# Patient Record
Sex: Male | Born: 1945 | Race: White | Hispanic: No | Marital: Married | Smoking: Former smoker
Health system: Southern US, Community
[De-identification: ages and names within clinical notes are randomized; demographics above are authoritative.]

## PROBLEM LIST (undated history)

## (undated) DIAGNOSIS — F32A Depression, unspecified: Secondary | ICD-10-CM

## (undated) DIAGNOSIS — D649 Anemia, unspecified: Secondary | ICD-10-CM

## (undated) DIAGNOSIS — K222 Esophageal obstruction: Secondary | ICD-10-CM

## (undated) DIAGNOSIS — N3010 Interstitial cystitis (chronic) without hematuria: Secondary | ICD-10-CM

## (undated) DIAGNOSIS — J189 Pneumonia, unspecified organism: Secondary | ICD-10-CM

## (undated) DIAGNOSIS — A048 Other specified bacterial intestinal infections: Secondary | ICD-10-CM

## (undated) DIAGNOSIS — I10 Essential (primary) hypertension: Secondary | ICD-10-CM

## (undated) DIAGNOSIS — N200 Calculus of kidney: Secondary | ICD-10-CM

## (undated) DIAGNOSIS — G4700 Insomnia, unspecified: Secondary | ICD-10-CM

## (undated) DIAGNOSIS — M6289 Other specified disorders of muscle: Secondary | ICD-10-CM

## (undated) DIAGNOSIS — K644 Residual hemorrhoidal skin tags: Secondary | ICD-10-CM

## (undated) DIAGNOSIS — K589 Irritable bowel syndrome without diarrhea: Secondary | ICD-10-CM

## (undated) DIAGNOSIS — E559 Vitamin D deficiency, unspecified: Secondary | ICD-10-CM

## (undated) DIAGNOSIS — M810 Age-related osteoporosis without current pathological fracture: Secondary | ICD-10-CM

## (undated) DIAGNOSIS — F419 Anxiety disorder, unspecified: Secondary | ICD-10-CM

## (undated) DIAGNOSIS — K602 Anal fissure, unspecified: Secondary | ICD-10-CM

## (undated) DIAGNOSIS — E161 Other hypoglycemia: Secondary | ICD-10-CM

## (undated) DIAGNOSIS — I2510 Atherosclerotic heart disease of native coronary artery without angina pectoris: Secondary | ICD-10-CM

## (undated) DIAGNOSIS — K648 Other hemorrhoids: Secondary | ICD-10-CM

## (undated) DIAGNOSIS — K3189 Other diseases of stomach and duodenum: Secondary | ICD-10-CM

## (undated) DIAGNOSIS — E785 Hyperlipidemia, unspecified: Secondary | ICD-10-CM

## (undated) DIAGNOSIS — M069 Rheumatoid arthritis, unspecified: Secondary | ICD-10-CM

## (undated) DIAGNOSIS — K449 Diaphragmatic hernia without obstruction or gangrene: Secondary | ICD-10-CM

## (undated) DIAGNOSIS — Z5189 Encounter for other specified aftercare: Secondary | ICD-10-CM

## (undated) DIAGNOSIS — F450 Somatization disorder: Secondary | ICD-10-CM

## (undated) DIAGNOSIS — K219 Gastro-esophageal reflux disease without esophagitis: Secondary | ICD-10-CM

## (undated) DIAGNOSIS — T7840XA Allergy, unspecified, initial encounter: Secondary | ICD-10-CM

## (undated) DIAGNOSIS — K5790 Diverticulosis of intestine, part unspecified, without perforation or abscess without bleeding: Secondary | ICD-10-CM

## (undated) DIAGNOSIS — M170 Bilateral primary osteoarthritis of knee: Secondary | ICD-10-CM

## (undated) DIAGNOSIS — H269 Unspecified cataract: Secondary | ICD-10-CM

## (undated) DIAGNOSIS — F329 Major depressive disorder, single episode, unspecified: Secondary | ICD-10-CM

## (undated) DIAGNOSIS — N529 Male erectile dysfunction, unspecified: Secondary | ICD-10-CM

## (undated) DIAGNOSIS — K5731 Diverticulosis of large intestine without perforation or abscess with bleeding: Secondary | ICD-10-CM

## (undated) DIAGNOSIS — M797 Fibromyalgia: Secondary | ICD-10-CM

## (undated) HISTORY — DX: Somatization disorder: F45.0

## (undated) HISTORY — DX: Other specified disorders of muscle: M62.89

## (undated) HISTORY — PX: VASECTOMY: SHX75

## (undated) HISTORY — DX: Diverticulosis of intestine, part unspecified, without perforation or abscess without bleeding: K57.90

## (undated) HISTORY — DX: Vitamin D deficiency, unspecified: E55.9

## (undated) HISTORY — DX: Anxiety disorder, unspecified: F41.9

## (undated) HISTORY — DX: Hyperlipidemia, unspecified: E78.5

## (undated) HISTORY — PX: TRANSURETHRAL RESECTION OF PROSTATE: SHX73

## (undated) HISTORY — DX: Other specified bacterial intestinal infections: A04.8

## (undated) HISTORY — DX: Diverticulosis of large intestine without perforation or abscess with bleeding: K57.31

## (undated) HISTORY — DX: Age-related osteoporosis without current pathological fracture: M81.0

## (undated) HISTORY — DX: Irritable bowel syndrome, unspecified: K58.9

## (undated) HISTORY — DX: Other hypoglycemia: E16.1

## (undated) HISTORY — DX: Gastro-esophageal reflux disease without esophagitis: K21.9

## (undated) HISTORY — DX: Residual hemorrhoidal skin tags: K64.8

## (undated) HISTORY — PX: OTHER SURGICAL HISTORY: SHX169

## (undated) HISTORY — DX: Major depressive disorder, single episode, unspecified: F32.9

## (undated) HISTORY — PX: ADENOIDECTOMY: SUR15

## (undated) HISTORY — PX: DENTAL SURGERY: SHX609

## (undated) HISTORY — DX: Residual hemorrhoidal skin tags: K64.4

## (undated) HISTORY — DX: Fibromyalgia: M79.7

## (undated) HISTORY — PX: INTERSTIM IMPLANT REMOVAL: SHX5131

## (undated) HISTORY — DX: Allergy, unspecified, initial encounter: T78.40XA

## (undated) HISTORY — DX: Unspecified cataract: H26.9

## (undated) HISTORY — DX: Other diseases of stomach and duodenum: K31.89

## (undated) HISTORY — DX: Encounter for other specified aftercare: Z51.89

## (undated) HISTORY — PX: CATHETER REMOVAL: SHX911

## (undated) HISTORY — DX: Depression, unspecified: F32.A

## (undated) HISTORY — DX: Diaphragmatic hernia without obstruction or gangrene: K44.9

## (undated) HISTORY — DX: Irritable bowel syndrome without diarrhea: K58.9

## (undated) HISTORY — PX: INTERSTIM IMPLANT PLACEMENT: SHX5130

## (undated) HISTORY — PX: ROBOT ASSISTED LAPAROSCOPIC COMPLETE CYSTECT ILEAL CONDUIT: SHX5139

## (undated) HISTORY — DX: Atherosclerotic heart disease of native coronary artery without angina pectoris: I25.10

## (undated) HISTORY — DX: Rheumatoid arthritis, unspecified: M06.9

## (undated) HISTORY — DX: Calculus of kidney: N20.0

## (undated) HISTORY — DX: Gastro-esophageal reflux disease without esophagitis: K22.2

## (undated) HISTORY — DX: Insomnia, unspecified: G47.00

## (undated) HISTORY — PX: CARDIAC CATHETERIZATION: SHX172

## (undated) HISTORY — PX: COLONOSCOPY: SHX174

## (undated) HISTORY — DX: Anemia, unspecified: D64.9

## (undated) HISTORY — DX: Anal fissure, unspecified: K60.2

## (undated) HISTORY — DX: Interstitial cystitis (chronic) without hematuria: N30.10

## (undated) HISTORY — DX: Bilateral primary osteoarthritis of knee: M17.0

## (undated) HISTORY — DX: Essential (primary) hypertension: I10

## (undated) HISTORY — DX: Male erectile dysfunction, unspecified: N52.9

## (undated) HISTORY — PX: KNEE ARTHROSCOPY: SUR90

---
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
## 1998-11-09 ENCOUNTER — Encounter: Admission: RE | Admit: 1998-11-09 | Discharge: 1998-12-27 | Payer: Self-pay | Admitting: Urology

## 2000-02-27 ENCOUNTER — Ambulatory Visit (HOSPITAL_COMMUNITY): Admission: RE | Admit: 2000-02-27 | Discharge: 2000-02-27 | Payer: Self-pay | Admitting: Gastroenterology

## 2000-02-27 ENCOUNTER — Encounter (INDEPENDENT_AMBULATORY_CARE_PROVIDER_SITE_OTHER): Payer: Self-pay | Admitting: Specialist

## 2000-12-13 ENCOUNTER — Emergency Department (HOSPITAL_COMMUNITY): Admission: EM | Admit: 2000-12-13 | Discharge: 2000-12-13 | Payer: Self-pay | Admitting: Emergency Medicine

## 2001-02-19 ENCOUNTER — Ambulatory Visit (HOSPITAL_COMMUNITY): Admission: RE | Admit: 2001-02-19 | Discharge: 2001-02-19 | Payer: Self-pay | Admitting: Gastroenterology

## 2001-03-28 ENCOUNTER — Encounter: Payer: Self-pay | Admitting: Urology

## 2001-03-28 ENCOUNTER — Ambulatory Visit (HOSPITAL_COMMUNITY): Admission: RE | Admit: 2001-03-28 | Discharge: 2001-03-28 | Payer: Self-pay | Admitting: Urology

## 2001-04-15 ENCOUNTER — Ambulatory Visit (HOSPITAL_COMMUNITY): Admission: RE | Admit: 2001-04-15 | Discharge: 2001-04-15 | Payer: Self-pay | Admitting: Urology

## 2002-02-06 ENCOUNTER — Ambulatory Visit (HOSPITAL_BASED_OUTPATIENT_CLINIC_OR_DEPARTMENT_OTHER): Admission: RE | Admit: 2002-02-06 | Discharge: 2002-02-06 | Payer: Self-pay | Admitting: Urology

## 2002-06-11 ENCOUNTER — Encounter: Admission: RE | Admit: 2002-06-11 | Discharge: 2002-06-11 | Payer: Self-pay | Admitting: Urology

## 2002-06-11 ENCOUNTER — Encounter: Payer: Self-pay | Admitting: Urology

## 2002-06-26 ENCOUNTER — Encounter: Payer: Self-pay | Admitting: Gastroenterology

## 2002-06-26 ENCOUNTER — Encounter: Admission: RE | Admit: 2002-06-26 | Discharge: 2002-06-26 | Payer: Self-pay | Admitting: Gastroenterology

## 2002-12-10 ENCOUNTER — Ambulatory Visit (HOSPITAL_COMMUNITY): Admission: RE | Admit: 2002-12-10 | Discharge: 2002-12-10 | Payer: Self-pay | Admitting: *Deleted

## 2002-12-10 ENCOUNTER — Encounter: Payer: Self-pay | Admitting: *Deleted

## 2003-02-12 ENCOUNTER — Ambulatory Visit (HOSPITAL_BASED_OUTPATIENT_CLINIC_OR_DEPARTMENT_OTHER): Admission: RE | Admit: 2003-02-12 | Discharge: 2003-02-12 | Payer: Self-pay | Admitting: Urology

## 2003-03-09 ENCOUNTER — Encounter: Payer: Self-pay | Admitting: *Deleted

## 2003-03-09 ENCOUNTER — Ambulatory Visit (HOSPITAL_COMMUNITY): Admission: RE | Admit: 2003-03-09 | Discharge: 2003-03-09 | Payer: Self-pay | Admitting: *Deleted

## 2003-04-19 ENCOUNTER — Encounter: Payer: Self-pay | Admitting: *Deleted

## 2003-04-19 ENCOUNTER — Ambulatory Visit (HOSPITAL_COMMUNITY): Admission: RE | Admit: 2003-04-19 | Discharge: 2003-04-19 | Payer: Self-pay | Admitting: *Deleted

## 2004-02-18 ENCOUNTER — Encounter: Payer: Self-pay | Admitting: Internal Medicine

## 2004-02-18 ENCOUNTER — Encounter (INDEPENDENT_AMBULATORY_CARE_PROVIDER_SITE_OTHER): Payer: Self-pay | Admitting: Specialist

## 2004-02-18 ENCOUNTER — Ambulatory Visit (HOSPITAL_COMMUNITY): Admission: RE | Admit: 2004-02-18 | Discharge: 2004-02-18 | Payer: Self-pay | Admitting: Gastroenterology

## 2004-03-14 ENCOUNTER — Encounter: Admission: RE | Admit: 2004-03-14 | Discharge: 2004-03-14 | Payer: Self-pay | Admitting: Surgery

## 2004-09-26 ENCOUNTER — Ambulatory Visit (HOSPITAL_BASED_OUTPATIENT_CLINIC_OR_DEPARTMENT_OTHER): Admission: RE | Admit: 2004-09-26 | Discharge: 2004-09-26 | Payer: Self-pay | Admitting: Urology

## 2004-11-14 ENCOUNTER — Ambulatory Visit (HOSPITAL_COMMUNITY): Admission: RE | Admit: 2004-11-14 | Discharge: 2004-11-14 | Payer: Self-pay | Admitting: Gastroenterology

## 2004-11-14 ENCOUNTER — Encounter: Payer: Self-pay | Admitting: Internal Medicine

## 2005-10-23 ENCOUNTER — Ambulatory Visit (HOSPITAL_COMMUNITY): Admission: RE | Admit: 2005-10-23 | Discharge: 2005-10-23 | Payer: Self-pay | Admitting: *Deleted

## 2005-11-12 DIAGNOSIS — I2510 Atherosclerotic heart disease of native coronary artery without angina pectoris: Secondary | ICD-10-CM

## 2005-11-12 HISTORY — DX: Atherosclerotic heart disease of native coronary artery without angina pectoris: I25.10

## 2006-04-01 ENCOUNTER — Encounter: Admission: RE | Admit: 2006-04-01 | Discharge: 2006-04-01 | Payer: Self-pay | Admitting: Gastroenterology

## 2006-04-01 ENCOUNTER — Encounter: Payer: Self-pay | Admitting: Internal Medicine

## 2006-05-10 ENCOUNTER — Encounter: Payer: Self-pay | Admitting: Internal Medicine

## 2006-09-30 ENCOUNTER — Inpatient Hospital Stay (HOSPITAL_COMMUNITY): Admission: EM | Admit: 2006-09-30 | Discharge: 2006-10-01 | Payer: Self-pay | Admitting: Emergency Medicine

## 2007-11-13 HISTORY — PX: NISSEN FUNDOPLICATION: SHX2091

## 2007-12-04 ENCOUNTER — Encounter: Payer: Self-pay | Admitting: Internal Medicine

## 2007-12-05 ENCOUNTER — Ambulatory Visit (HOSPITAL_BASED_OUTPATIENT_CLINIC_OR_DEPARTMENT_OTHER): Admission: RE | Admit: 2007-12-05 | Discharge: 2007-12-05 | Payer: Self-pay | Admitting: Urology

## 2008-02-26 ENCOUNTER — Encounter: Payer: Self-pay | Admitting: Internal Medicine

## 2008-02-27 ENCOUNTER — Encounter: Admission: RE | Admit: 2008-02-27 | Discharge: 2008-02-27 | Payer: Self-pay | Admitting: Gastroenterology

## 2008-02-27 ENCOUNTER — Encounter: Payer: Self-pay | Admitting: Internal Medicine

## 2008-05-11 ENCOUNTER — Ambulatory Visit (HOSPITAL_COMMUNITY): Admission: RE | Admit: 2008-05-11 | Discharge: 2008-05-11 | Payer: Self-pay | Admitting: Gastroenterology

## 2008-05-11 ENCOUNTER — Encounter: Payer: Self-pay | Admitting: Internal Medicine

## 2008-05-13 ENCOUNTER — Encounter: Payer: Self-pay | Admitting: Internal Medicine

## 2008-05-13 HISTORY — PX: UPPER GASTROINTESTINAL ENDOSCOPY: SHX188

## 2008-06-04 ENCOUNTER — Encounter: Payer: Self-pay | Admitting: Internal Medicine

## 2008-06-04 ENCOUNTER — Inpatient Hospital Stay (HOSPITAL_COMMUNITY): Admission: RE | Admit: 2008-06-04 | Discharge: 2008-06-06 | Payer: Self-pay | Admitting: Surgery

## 2008-11-10 ENCOUNTER — Encounter: Admission: RE | Admit: 2008-11-10 | Discharge: 2008-11-10 | Payer: Self-pay | Admitting: Neurology

## 2008-11-12 HISTORY — PX: SHOULDER ARTHROSCOPY: SHX128

## 2008-11-17 ENCOUNTER — Encounter: Admission: RE | Admit: 2008-11-17 | Discharge: 2008-11-17 | Payer: Self-pay | Admitting: Neurology

## 2009-03-29 ENCOUNTER — Encounter: Payer: Self-pay | Admitting: Internal Medicine

## 2009-04-12 ENCOUNTER — Encounter: Payer: Self-pay | Admitting: Internal Medicine

## 2009-04-13 ENCOUNTER — Encounter: Admission: RE | Admit: 2009-04-13 | Discharge: 2009-04-13 | Payer: Self-pay | Admitting: Family Medicine

## 2009-07-21 ENCOUNTER — Encounter: Payer: Self-pay | Admitting: Internal Medicine

## 2009-10-11 ENCOUNTER — Encounter: Admission: RE | Admit: 2009-10-11 | Discharge: 2009-10-11 | Payer: Self-pay | Admitting: Family Medicine

## 2009-11-01 ENCOUNTER — Encounter: Admission: RE | Admit: 2009-11-01 | Discharge: 2009-11-01 | Payer: Self-pay | Admitting: Orthopedic Surgery

## 2009-11-07 ENCOUNTER — Emergency Department (HOSPITAL_COMMUNITY): Admission: EM | Admit: 2009-11-07 | Discharge: 2009-11-08 | Payer: Self-pay | Admitting: Emergency Medicine

## 2009-11-09 ENCOUNTER — Ambulatory Visit (HOSPITAL_COMMUNITY): Admission: RE | Admit: 2009-11-09 | Discharge: 2009-11-10 | Payer: Self-pay | Admitting: Orthopedic Surgery

## 2010-01-05 ENCOUNTER — Ambulatory Visit: Payer: Self-pay | Admitting: Psychology

## 2010-04-21 ENCOUNTER — Encounter: Payer: Self-pay | Admitting: Internal Medicine

## 2010-04-24 ENCOUNTER — Encounter: Payer: Self-pay | Admitting: Internal Medicine

## 2010-05-01 ENCOUNTER — Encounter (INDEPENDENT_AMBULATORY_CARE_PROVIDER_SITE_OTHER): Payer: Self-pay | Admitting: *Deleted

## 2010-05-01 ENCOUNTER — Encounter: Payer: Self-pay | Admitting: Internal Medicine

## 2010-06-09 ENCOUNTER — Ambulatory Visit: Payer: Self-pay | Admitting: Internal Medicine

## 2010-06-09 DIAGNOSIS — M797 Fibromyalgia: Secondary | ICD-10-CM | POA: Insufficient documentation

## 2010-06-09 DIAGNOSIS — R141 Gas pain: Secondary | ICD-10-CM | POA: Insufficient documentation

## 2010-06-09 DIAGNOSIS — R143 Flatulence: Secondary | ICD-10-CM

## 2010-06-09 DIAGNOSIS — R142 Eructation: Secondary | ICD-10-CM

## 2010-06-09 DIAGNOSIS — IMO0001 Reserved for inherently not codable concepts without codable children: Secondary | ICD-10-CM | POA: Insufficient documentation

## 2010-06-09 DIAGNOSIS — R5383 Other fatigue: Secondary | ICD-10-CM

## 2010-06-09 DIAGNOSIS — K589 Irritable bowel syndrome without diarrhea: Secondary | ICD-10-CM

## 2010-06-09 DIAGNOSIS — R5381 Other malaise: Secondary | ICD-10-CM | POA: Insufficient documentation

## 2010-06-09 DIAGNOSIS — R1032 Left lower quadrant pain: Secondary | ICD-10-CM | POA: Insufficient documentation

## 2010-06-09 DIAGNOSIS — N3010 Interstitial cystitis (chronic) without hematuria: Secondary | ICD-10-CM | POA: Insufficient documentation

## 2010-06-09 DIAGNOSIS — R197 Diarrhea, unspecified: Secondary | ICD-10-CM | POA: Insufficient documentation

## 2010-06-09 HISTORY — DX: Irritable bowel syndrome, unspecified: K58.9

## 2010-06-09 LAB — CONVERTED CEMR LAB
ALT: 29 units/L (ref 0–53)
AST: 24 units/L (ref 0–37)
Albumin: 4.2 g/dL (ref 3.5–5.2)
Alkaline Phosphatase: 61 units/L (ref 39–117)
BUN: 17 mg/dL (ref 6–23)
Basophils Absolute: 0 10*3/uL (ref 0.0–0.1)
Basophils Relative: 0.5 % (ref 0.0–3.0)
CO2: 28 meq/L (ref 19–32)
Calcium: 9.4 mg/dL (ref 8.4–10.5)
Chloride: 103 meq/L (ref 96–112)
Creatinine, Ser: 1.2 mg/dL (ref 0.4–1.5)
Eosinophils Absolute: 0.2 10*3/uL (ref 0.0–0.7)
Eosinophils Relative: 2.2 % (ref 0.0–5.0)
GFR calc non Af Amer: 66.87 mL/min (ref >60)
Glucose, Bld: 74 mg/dL (ref 70–99)
HCT: 44.4 % (ref 39.0–52.0)
Hemoglobin: 15.3 g/dL (ref 13.0–17.0)
IgA: 360 mg/dL (ref 68–378)
Lymphocytes Relative: 38.6 % (ref 12.0–46.0)
Lymphs Abs: 3.3 10*3/uL (ref 0.7–4.0)
MCHC: 34.4 g/dL (ref 30.0–36.0)
MCV: 91.9 fL (ref 78.0–100.0)
Monocytes Absolute: 0.9 10*3/uL (ref 0.1–1.0)
Monocytes Relative: 10.9 % (ref 3.0–12.0)
Neutro Abs: 4 10*3/uL (ref 1.4–7.7)
Neutrophils Relative %: 47.8 % (ref 43.0–77.0)
Platelets: 288 10*3/uL (ref 150.0–400.0)
Potassium: 4.7 meq/L (ref 3.5–5.1)
RBC: 4.83 M/uL (ref 4.22–5.81)
RDW: 13.1 % (ref 11.5–14.6)
Sodium: 136 meq/L (ref 135–145)
TSH: 1.69 microintl units/mL (ref 0.35–5.50)
Total Bilirubin: 0.8 mg/dL (ref 0.3–1.2)
Total Protein: 7.2 g/dL (ref 6.0–8.3)
WBC: 8.5 10*3/uL (ref 4.5–10.5)

## 2010-06-15 LAB — CONVERTED CEMR LAB: Tissue Transglutaminase Ab, IgA: 13.6 units (ref <20)

## 2010-12-03 ENCOUNTER — Encounter: Payer: Self-pay | Admitting: Neurology

## 2010-12-12 NOTE — Letter (Signed)
Summary: New Patient letter  [facility]  [address] [place], [place] [address]   Phone: [phone]  Fax: [phone]       05/01/2010 MRN: [id number]  [name] [address] [place], [place]  [address]  Dear [name],  Welcome to the Gastroenterology Division at [facility].    You are scheduled to see Dr.  Leone Payor on  06-09-10 at 2pm on the 3rd floor at [facility], [address]  We ask that you try to arrive at our office 15 minutes prior to your appointment time to allow for check-in.  We would like you to complete the enclosed self-administered evaluation form prior to your visit and bring it with you on the day of your appointment.  We will review it with you.  Also, please bring a complete list of all your medications or, if you prefer, bring the medication bottles and we will list them.  Please bring your insurance card so that we may make a copy of it.  If your insurance requires a referral to see a specialist, please bring your referral form from your primary care physician.  Co-payments are due at the time of your visit and may be paid by cash, check or credit card.     Your office visit will consist of a consult with your physician (includes a physical exam), any laboratory testing he/she may order, scheduling of any necessary diagnostic testing (e.g. x-ray, ultrasound, CT-scan), and scheduling of a procedure (e.g. Endoscopy, Colonoscopy) if required.  Please allow enough time on your schedule to allow for any/all of these possibilities.    If you cannot keep your appointment, please call [phone] to cancel or reschedule prior to your appointment date.  This allows [name] the opportunity to schedule an appointment for another patient in need of care.  If you do not cancel or reschedule by 5 p.m. the business day prior to your appointment date, you will be charged a $50.00 late cancellation/no-show fee.    Thank you for choosing  [facility] for your medical needs.  We appreciate the opportunity to care for you.  Please visit [name] at our website  to learn more about our practice.                     Sincerely,                                                             The Gastroenterology Division

## 2010-12-12 NOTE — Letter (Signed)
Summary: [facility]  [facility]   Imported By: Lester Seguin 06/20/2010 07:55:05  _____________________________________________________________________  External Attachment:    Type:   Image     Comment:   External Document

## 2010-12-12 NOTE — Letter (Signed)
Summary: [facility]  [facility]   Imported By: Lester Anderson 06/20/2010 07:45:41  _____________________________________________________________________  External Attachment:    Type:   Image     Comment:   External Document

## 2010-12-12 NOTE — Procedures (Signed)
Summary: Esophageal Manometry  NAME:  [name], [name] NO.:  [id number]      MEDICAL RECORD NO.:  [id number]         PATIENT TYPE:  LAMB      LOCATION:                               FACILITY:  ENDO      PHYSICIAN:  Petra Kuba, M.D.    DATE OF BIRTH:  08/18/1946      DATE OF PROCEDURE:  05/11/2008   DATE OF DISCHARGE:                                  OPERATIVE REPORT      PROCEDURE:  Manometry.      INDICATION:  The patient with hiatal hernia contemplating surgical   repair.  Consent was signed after risks, benefits, methods, and options.   Procedure was done by the endoscopy nurse in the endoscopy unit in the   customary fashion, and computer-generated report was given to me for my   interpretation.   1. Upper esophageal sphincter had low-normal peak pressures, normal       residual pressures, and normal percent relaxation.   2. Esophageal body.       a.     Upper with normal amplitude, normal duration.       b.     Lower, normal amplitude, duration, velocity, peristalsis,        and waveforms.   3. LES, extremely low normal pressure 10.1 with normal being 10 and       above to 45, slight increased residual pressure, 13% relaxation,       normal duration.      IMPRESSION:  Overall essentially normal manometry.      PLAN:  Okay for surgery per Dr. Daphine Deutscher.                  ______________________________   Petra Kuba, M.D.            MEM/MEDQ  D:  05/19/2008  T:  05/20/2008  Job:  [id number]      cc:   [place] Daphine Deutscher, MD

## 2010-12-12 NOTE — Letter (Signed)
Summary: [facility]   Imported By: Lester [place] 06/20/2010 07:42:48  _____________________________________________________________________  External Attachment:    Type:   Image     Comment:   External Document

## 2010-12-12 NOTE — Op Note (Signed)
Summary: EGD   NAME:  [name], [name]                          ACCOUNT NO.:  [id number]   MEDICAL RECORD NO.:  [id number]                   PATIENT TYPE:  AMB   LOCATION:  ENDO                                 FACILITY:  [facility]   PHYSICIAN:  Petra Kuba, M.D.                 DATE OF BIRTH:  [birth year]   DATE OF PROCEDURE:  02/18/2004  DATE OF DISCHARGE:                                 OPERATIVE REPORT   PROCEDURE:  Esophagogastroduodenoscopy with biopsy.   INDICATIONS FOR PROCEDURE:  A patient with upper tract symptoms want to  reevaluate his esophagus.  Consent was signed after risks, benefits,  methods, and options were thoroughly discussed multiple times in the past.   MEDICINES USED:  Demerol 100, Versed 10.   DESCRIPTION OF PROCEDURE:  The video endoscope was inserted by direct  vision.  A quick look at the vocal cords were normal. The scope was inserted  through a normal proximal and mid esophagus. In the distal esophagus was a  small hiatal hernia and one small linear erosion. The scope passed in the  stomach where some proximal gastritis was seen, advanced to a normal antrum,  normal pylorus into a normal duodenal bulb and around the C loop to a normal  second portion of the duodenum. The scope was withdrawn back to the bulb and  a good look there ruled out ulcers in that location. The scope was withdrawn  back to the stomach and retroflexed. The proximal gastritis was confirmed,  it was mild. High in the cardia, the hiatal hernia was confirmed. The  angularis, lesser and greater curve were normal. Straight visualization of  the stomach did not reveal any additional findings. The scope was then  slowly withdrawn to 20 cm confirming above esophageal findings.  The scope  was then advanced to the distal esophagus, a few biopsies of the erosion in  the distal esophagus were obtained.  The scope was then slowly withdrawn  again confirming normal proximal and mid esophagus. The  scope was removed.  The patient tolerated the procedure well. There was no obvious or immediate  complications.   ENDOSCOPIC DIAGNOSIS:  1. Small hiatal hernia with linear erosion biopsied.  2. Proximal mild gastritis.  3. Otherwise normal esophagogastroduodenoscopy.   PLAN:  Try even a lower dose of the __________ since it seems to help but  side effects are limiting his use of pump inhibitors and H2 blockers.  Would  like to rediscuss surgery options with him and probably will need a repeat  manometry prior which he has had some trouble tolerating in the past.                                               Vernia Buff E.  Magod, M.D.    MEM/MEDQ  D:  02/18/2004  T:  02/18/2004  Job:  [id number]   cc:   Lucky Cowboy, M.D.  [address] [place]  [place] [address]  Fax: [phone]   Jamison Neighbor, M.D.  [address]  [place]  [place] [address]  Fax: [phone]   Meredith Staggers, M.D.  [address]  [place]  [place] [address]  Fax: [phone]

## 2010-12-12 NOTE — Letter (Signed)
Summary: [facility]  [facility]   Imported By: Lester [place] 06/20/2010 07:41:16  _____________________________________________________________________  External Attachment:    Type:   Image     Comment:   External Document

## 2010-12-12 NOTE — Letter (Signed)
Summary: [facility]  [facility]   Imported By: Lester [place] 06/20/2010 07:47:02  _____________________________________________________________________  External Attachment:    Type:   Image     Comment:   External Document

## 2010-12-12 NOTE — Letter (Signed)
Summary: [facility]  [facility]   Imported By: Lester  06/20/2010 07:53:08  _____________________________________________________________________  External Attachment:    Type:   Image     Comment:   External Document

## 2010-12-12 NOTE — Assessment & Plan Note (Signed)
Summary: LLQ PAIN/DIVERTICULIS/YF   History of Present Illness Visit Type: consult  Primary GI MD: Stan Head MD [facility] Primary Provider: Brent Bulla, MD  Requesting Provider: Brent Bulla, MD  Chief Complaint: LLQ abd pain  History of Present Illness:   65 yo man with IBS and GERD, previously followed by Dr. Ewing Schlein. He says he was "fired" after raising questions about his diagnosis. He developed sharp LLQ pain, nausea, chills and fever, 102 and diarrhea in June. It started suddenly and he says he never had before. It started while he was trying to reduce lorazepam and he ? if related. he was treated with IM Rocephin, then by mouth cipro and metronidazole for presumed diverticulitis though he believs he was tod he had colitis. He has had chronic intermittent LLQ pain, problems with constipation and diarrhea over the years. It is difficult for him to tell what is pain from interstitial cystitis vcs. GI pain. He is not on therapy for IC due to lack of efficacy he says. Has tried multiple therapies.  There is occasional LLQ pains now, stools soft 2 in AM with extremem flatulence at night. Says he had some gas before but not like this. Records revieww shows that he has c/o gas over the years.  probiotics have not helped much was treated for H. pylori  has eliminated lactose without relief it seems "I don't know" He responds "i don't know when queried about effectiveness of Librax, amitriptylline, anti-sasmodics.  Wife is here but did not contribute much to history.  he has used ca+ and vit D and also has peppermint oil capsules and ?'s if they shhould be used. Again does not indicate if they have helped.    GI Review of Systems    Reports abdominal pain, bloating, and  chest pain.     Location of  Abdominal pain: LLQ.    Denies acid reflux, belching, dysphagia with liquids, dysphagia with solids, heartburn, loss of appetite, nausea, vomiting, vomiting blood, weight loss, and  weight  gain.      Reports diverticulosis, fecal incontinence, irritable bowel syndrome, rectal bleeding, and  rectal pain.     Denies anal fissure, black tarry stools, change in bowel habit, constipation, diarrhea, heme positive stool, hemorrhoids, jaundice, light color stool, and  liver problems.    EGD  Procedure date:  02/18/2004  Findings:      Small hiatal hernia with erosion - reflux changes on bx gastritis  Magod  EGD  Procedure date:  05/10/2006  Findings:      Hiatal hernia mild esophageal stenosis gastritis  Magod  EGD  Procedure date:  05/13/2008  Findings:      Small hiatal hernia with erosion - reflux changes gastritis + H. pylori  Magod  Colonoscopy  Procedure date:  05/10/2006  Findings:      Diverticulosis Int/ext hemorrhoids Otherwise normal including terminal ileum  Magod   [name] of Abdomen  Procedure date:  02/27/2008  Findings:      Stable right renal cysts otherwise normal  [name] of Abdomen  Procedure date:  04/01/2006  Findings:      renal cysts - right otherwise normal  Gastric Emptying Study  Procedure date:  11/14/2004  Findings:      50% residual activity at 60 minutes DELAYED Borderline 12% residual at 120 minutes NORMAL  Procedures Next Due Date:    Colonoscopy: 05/2016 12   Current Medications (verified): 1)  Vitamin D (Ergocalciferol) 50000 Unit Caps (Ergocalciferol) .... One Capsule By  Mouth Once A Month 2)  Tamsulosin Hcl 0.4 Mg Caps (Tamsulosin Hcl) .... One Capsule By Mouth At Bedtime 3)  Hydroxyzine Hcl 50 Mg Tabs (Hydroxyzine Hcl) .... One Tablet By Mouth Once Daily At Bedtime 4)  Lorazepam 0.5 Mg Tabs (Lorazepam) .... One Tablet By Mouth Once Daily At Bedtime  Allergies (verified): No Known Drug Allergies  Past History:  Past Medical History: Allergic Rhinitis Rheumatism Insomnia Anxiety Disorder Coronary Artery Disease Diverticulitis Fibromyalgia GERD/esophageal  stricture Hyperlipidemia Hypertension Interstitial Cystitis Irritable Bowel Syndrome Anal Fissure H. pylori gastritis (treated) Depression  Past Surgical History: Nissen Fundoplication 2009.Marland KitchenMarland KitchenMarland KitchenMartin Tonsillectomy & Adenoidectomy Vasectomy Knee Arthroscopy-Right x 2 TURP x 2 Bladder Distention x 6 Left Shoulder surgery-2010 arthroscopy  Family History: No FH of Colon Cancer: Family History of Breast Cancer:Sister  Family History of Colon Polyps:Father  Family History of Diabetes: Father and Sister  Family History of Heart Disease: Father and Mother   Social History: Disabled  Married, 4 boys Patient is a former smoker.  Alcohol Use - no Illicit Drug Use - no Smoking Status:  quit Drug Use:  no  Review of Systems       The patient complains of anxiety-new, fatigue, itching, night sweats, sleeping problems, and urination - excessive.         Severe fatigue  All other ROS negative except as per HPI.   Vital Signs:  Patient profile:   65 year old male Height:      66 inches Weight:      166 pounds BMI:     26.89 BSA:     1.85 Pulse rate:   76 / minute Pulse rhythm:   regular BP sitting:   124 / 62  (left arm) Cuff size:   regular  Vitals Entered By: Ok Anis CMA (June 09, 2010 2:10 PM)  Physical Exam  General:  Well developed, well nourished, no acute distress. Eyes:  PERRLA, no icterus. Mouth:  No deformity or lesions, dentition normal. Neck:  Supple; no masses or thyromegaly. Lungs:  Clear throughout to auscultation. Heart:  Regular rate and rhythm; no murmurs, rubs,  or bruits. Abdomen:  Soft, nontender and nondistended. No masses, hepatosplenomegaly or hernias noted. Normal bowel sounds. Extremities:  No clubbing, cyanosis, edema or deformities noted. Neurologic:  Alert and  oriented x4;  grossly normal neurologically. Cervical Nodes:  No significant cervical or supraclavicular adenopathy.  Psych:  Alert and cooperative. Normal mood and  affect.  Labs and office notes 2007-2010 reviewed and scanned. Stool stuidies, CBC, CMET all ok 2010.  Impression & Recommendations:  Problem # 1:  IRRITABLE BOWEL SYNDROME (ICD-564.1) Seems pretty clear that he has this. Will evaluate for celiac but unlikely. Extensive work-up to date negative and not likely torepeat studies. Nissen fundoplication could be aggravating flatulence and causing gas bloat. consider TCA again  he seems not to remember effects of other medications in past, must conclude that they were ineffective I get the sense he may not accept this as cause of problems will need to clarify if things worsende after Nissen fundooplication  Problem # 2:  INTERSTITIAL CYSTITIS (ICD-595.1) Assessment: New Not on therapy has had hydrodistention, injections, meds like Elmiron but ot helpful. This could be difficult if no therapeutic options. He has f/u Dr. Logan Bores.  Problem # 3:  FIBROMYALGIA (ICD-729.1) Assessment: New Not much of an issue now but presence supports dx of IBS.  Problem # 4:  FLATULENCE-GAS-BLOATING (ICD-787.3) Could be related to fundoplication. We did not discuss that  today. SIBO possible so ? if needs xifaxan or other abx await labs  Problem # 5:  DIARRHEA (ICD-787.91) Assessment: New In past had constipation now this. Believe he had some acute illness in June and is slowly recovering. Will screen with labs. He is convinced he has malabsorption causing low energy. Orders: TLB-CBC Platelet - w/Differential (85025-CBCD) TLB-CMP (Comprehensive Metabolic Pnl) (80053-COMP) TLB-TSH (Thyroid Stimulating Hormone) (84443-TSH) TLB-IgA (Immunoglobulin A) (82784-IGA) T-Tissue Transglutamase Ab IgA (21308-65784)  Problem # 6:  ABDOMINAL PAIN-LLQ (ICD-789.04) Assessment: New Chronic and recurrent. IBS vs. IC or both (likely) TCA could help if he would take.  Problem # 7:  FATIGUE (ICD-780.79) Assessment: New more likely psychrelated  than anything he  ?'s  malasorbtion and "lack of nutrients"  Patient Instructions: 1)  Please go to the basement to have your lab tests drawn today.  2)  We will call you with further follow up after reviewing these results. 3)  Copy sent to : Brent Bulla, MD, Marcelyn Bruins, MD 4)  The medication list was reviewed and reconciled.  All changed / newly prescribed medications were explained.  A complete medication list was provided to the patient / caregiver. also cc: Wenda Low, MD

## 2010-12-12 NOTE — Letter (Signed)
Summary: [facility]  [facility]   Imported By: Lester [place] 06/20/2010 07:54:15  _____________________________________________________________________  External Attachment:    Type:   Image     Comment:   External Document

## 2010-12-12 NOTE — Op Note (Signed)
Summary: lap nissen  NAME:  [name], [name] NO.:  [id number]      MEDICAL RECORD NO.:  [id number]          PATIENT TYPE:  OIB      LOCATION:  0098                         FACILITY:  [facility]      PHYSICIAN:  Thornton Park. Daphine Deutscher, MD  DATE OF BIRTH:  17-Jul-1946      DATE OF PROCEDURE:  06/04/2008   DATE OF DISCHARGE:                                  OPERATIVE REPORT      PREOPERATIVE INDICATIONS:  [name] is a 64 year old man that I had   been seeing off and on since the 90s and an ongoing discussion about   antireflux surgery.  He has continued to have problems and decided he   wanted to go ahead and have this repaired.  Preoperatively, he had a   normal manometry and I reviewed his upper GI which really did not show   much in the way of a hiatal hernia but does have reflux.      SURGEON:  Thornton Park. Daphine Deutscher, MD.      ASSISTANT:  Clovis Pu. Cornett, M.D.      ANESTHESIA:  General endotracheal.      PROCEDURE:  Laparoscopic Nissen fundoplication with repair of the   diaphragm with two pledgeted sutures posteriorly, 3 suture wrap over a   #56 lighted bougie.      DESCRIPTION OF PROCEDURE:  [name] was taken to room #11 on Friday,   June 04, 2008, and given general anesthesia.  The abdomen was prepped   with Techni-Care and draped sterilely.  Abdomen was entered through the   left upper quadrant using a 0 degree 11 OptiVu without difficulty and   then another 11 was used above the umbilicus to the left of midline for   camera and then two ports were used again that was on the right side.  A   5 mm was placed in the upper midline where a retractor was inserted   using the [employer]  This was fixed to the bed with the iron   intern and the liver was elevated.      I began my dissection in the pars flaccida region taking down the   gastrohepatic ligament exposing the right hemidiaphragm and incising   that along with carrying this anteriorly and taking  this over to the   patient's left crus.      Next, I took down the short gastrics beginning about a third of the way   down and took these up and really mobilized the top part of the stomach   very well using a harmonic scalpel.  Bleeding was controlled.  I got   around the esophagogastric junction with a Penrose and held it in the   abdomen and got good esophageal length into the abdomen.      I then repaired the diaphragm with two pledgeted sutures posteriorly   using Endo stitch device and extracorporeal ties.  Dr. Shireen Quan then   passed the #56 lighted bougie and I brought around a portion  of the   cardia and then basically invaginated the distal esophagus in this wrap.   This was sutured in place with 3 sutures for top 2 taking free 2-0   Surgidac with tie knots affixing it to the esophagus and then the third   with the Endo stitch in an intracorporeal fashion.  We also used some   Tisseel to go between the wrapped stomach and esophagus, both midline   and over on the left side.  Hemostasis was present.  Everything looked   good.  The Nathanson retractor was removed and then the   abdomen was deflated.  Wounds were injected with 0.50% Marcaine and   closed with 4-0 Vicryl with Benzoin and Steri-Strips.  The patient   seemed to tolerate the procedure well and was taken to the recovery room   in satisfactory condition.               Thornton Park Daphine Deutscher, MD   Electronically Signed            MBM/MEDQ  D:  06/04/2008  T:  06/04/2008  Job:  [id number]      cc:   Petra Kuba, M.D.   Fax: [phone]      Jamison Neighbor, M.D.   Fax: [phone]

## 2010-12-12 NOTE — Procedures (Signed)
Summary: ENDO/Marc Shanon Ace MD  ENDO/Marc Shanon Ace MD   Imported By: Lester [place] 06/20/2010 07:35:04  _____________________________________________________________________  External Attachment:    Type:   Image     Comment:   External Document

## 2010-12-12 NOTE — Procedures (Signed)
Summary: Colon/Marc Shanon Ace MD  Colon/Marc Shanon Ace MD   Imported By: Lester [place] 06/20/2010 07:31:36  _____________________________________________________________________  External Attachment:    Type:   Image     Comment:   External Document

## 2010-12-12 NOTE — Procedures (Signed)
Summary: Alanson Aly MD  Alanson Aly MD   Imported By: Lester [place] 06/20/2010 07:38:53  _____________________________________________________________________  External Attachment:    Type:   Image     Comment:   External Document

## 2011-02-12 LAB — CBC
HCT: 47.5 % (ref 39.0–52.0)
Hemoglobin: 16.2 g/dL (ref 13.0–17.0)
MCHC: 34 g/dL (ref 30.0–36.0)
MCV: 90.9 fL (ref 78.0–100.0)
Platelets: 313 10*3/uL (ref 150–400)
RBC: 5.23 MIL/uL (ref 4.22–5.81)
RDW: 12.4 % (ref 11.5–15.5)
WBC: 11.3 10*3/uL — ABNORMAL HIGH (ref 4.0–10.5)

## 2011-03-27 NOTE — Discharge Summary (Signed)
NAME:  [name], [name] NO.:  [id number]   MEDICAL RECORD NO.:  [id number]          PATIENT TYPE:  INP   LOCATION:  1532                         [facility]ACILITY[facility]   PHYSICIAN:  Thornton Park. Daphine Deutscher, MD  DATE OF BIRTH:  09-12-1946   DATE OF ADMISSION:  06/04/2008  DATE OF DISCHARGE:  06/06/2008                               DISCHARGE SUMMARY   PREOPERATIVE DIAGNOSIS:  Gastroesophageal reflux disease.   PROCEDURE:  On June 04, 2008, laparoscopic Nissen fundoplication over a  #56 lighted bougie, two suture pledgeted closure of the diaphragm  posteriorly and a three suture fundoplication wrap.   HOSPITAL COURSE:  [name] had a Friday July 24 laparoscopic Nissen.  On Saturday he was mainly having some musculoskeletal soreness.  He was  started on clear liquids.  At that point his laboratory was fine with a  stable hemoglobin of 13 and white count of 9000.  He took p.o.  successfully and was ready for discharge on postop day 2.  He was not  having any chest pain and was swallowing clear liquids fine.  He was  given prescriptions for Lortab elixir to take if needed for pain and  Phenergan suppositories to have on hand in case he got nauseated.  He  has an underlying diagnosis of interstitial cystitis and does have some  difficulty with bowel movements.  He was therefore getting a Fleet enema  prior to discharge.      Thornton Park Daphine Deutscher, MD  Electronically Signed     MBM/MEDQ  D:  06/06/2008  T:  06/06/2008  Job:  [id number]   cc:   Petra Kuba, M.D.  Fax: [phone]   Jamison Neighbor, M.D.  Fax: [phone]

## 2011-03-27 NOTE — Op Note (Signed)
NAME:  [name], [name] NO.:  [id number]   MEDICAL RECORD NO.:  [id number]          PATIENT TYPE:  AMB   LOCATION:  [facility]                         FACILITY:  [facility]   PHYSICIAN:  Jamison Neighbor, M.D.  DATE OF BIRTH:  [birth year]   DATE OF PROCEDURE:  12/05/2007  DATE OF DISCHARGE:  12/05/2007                               OPERATIVE REPORT   PREOPERATIVE DIAGNOSES:  1. Interstitial cystitis.  2. History of urgency incontinence.   POSTOPERATIVE DIAGNOSES:  1. Interstitial cystitis.  2. History of urgency incontinence.   PROCEDURE:  Cystoscopy, hydrodistention of the bladder, Marcaine and  Pyridium installation, Botox injections x2.   SURGEON:  Jamison Neighbor, M.D.   ANESTHESIA:  Was general.   COMPLICATIONS:  None.   DRAINS:  None.   BRIEF HISTORY:  This 65 year old male has interstitial cystitis with  associated urgency, frequency and voiding dysfunction.  The patient has  not responded well to oral therapy and in fact has allergic and/or  sensitivity reactions to almost any form of therapy.  He has requested  repeat cystoscopy and hydrodistention be performed.  This has helped him  in the past.  He also would like to try Botox to see if it will decrease  his frequency and urgency.  He is well aware of the fact that this might  cause retention but he notes he already does catheterization for bladder  installation purposes and has no problems doing self-catheterization.  He gave full informed consent for the procedure.   PROCEDURE IN DETAIL:  After successful induction of general anesthesia  the patient was placed in the dorsal lithotomy position, prepped with  Betadine and draped in the usual sterile fashion.  Cystoscopy was  performed and the urethra was visualized in its entirety and found to be  normal.  Beyond the verumontanum the prostatic fossa was well resected  and wide open.  The bladder was carefully inspected.  No tumors or  stones could be  seen.  Both ureteral orifices were normal in  configuration and location.  Hydrodistention of the bladder was  performed.  The bladder was distended at a pressure of 100 cm of water  for 5 minutes.  When the bladder was drained glomerulations could be  seen throughout the bladder.  The bladder capacity of 575 was  approximately average for interstitial cystitis patient but markedly low  for patients who do not have this condition.  There was nothing that  required biopsy.  Botox injections were performed with a total of 2 ampules in divided  doses throughout the bladder primarily focusing on the trigone and base.  The bladder was drained.  A mixture of Marcaine and Pyridium was left in  the bladder.  The patient tolerated the procedure and was taken to  recovery in good condition.      Jamison Neighbor, M.D.  Electronically Signed     RJE/MEDQ  D:  12/26/2007  T:  12/28/2007  Job:  [id number]

## 2011-03-27 NOTE — Op Note (Signed)
NAME:  NAJIB, COLMENARES NO.:  [id number]   MEDICAL RECORD NO.:  [id number]         PATIENT TYPE:  LAMB   LOCATION:                               FACILITY:  [facility]   PHYSICIAN:  Petra Kuba, M.D.    DATE OF BIRTH:  1946-08-23   DATE OF PROCEDURE:  05/11/2008  DATE OF DISCHARGE:                               OPERATIVE REPORT   PROCEDURE:  Manometry.   INDICATION:  The patient with hiatal hernia contemplating surgical  repair.  Consent was signed after risks, benefits, methods, and options.  Procedure was done by the endoscopy nurse in the endoscopy unit in the  customary fashion, and computer-generated report was given to me for my  interpretation.  1. Upper esophageal sphincter had low-normal peak pressures, normal      residual pressures, and normal percent relaxation.  2. Esophageal body.      a.     Upper with normal amplitude, normal duration.      b.     Lower, normal amplitude, duration, velocity, peristalsis,       and waveforms.  3. LES, extremely low normal pressure 10.1 with normal being 10 and      above to 45, slight increased residual pressure, 13% relaxation,      normal duration.   IMPRESSION:  Overall essentially normal manometry.   PLAN:  Okay for surgery per Dr. Daphine Deutscher.           ______________________________  Petra Kuba, M.D.     MEM/MEDQ  D:  05/19/2008  T:  05/20/2008  Job:  [id number]   cc:   [place] Daphine Deutscher, MD

## 2011-03-27 NOTE — Op Note (Signed)
NAME:  HUBER, MATHERS NO.:  [id number]   MEDICAL RECORD NO.:  [id number]          PATIENT TYPE:  OIB   LOCATION:  0098                         FACILITY:  [facility]   PHYSICIAN:  Thornton Park. Daphine Deutscher, MD  DATE OF BIRTH:  [birth year]   DATE OF PROCEDURE:  06/04/2008  DATE OF DISCHARGE:                               OPERATIVE REPORT   PREOPERATIVE INDICATIONS:  Dorman Calderwood is a 65 year old man that I had  been seeing off and on since the 90s and an ongoing discussion about  antireflux surgery.  He has continued to have problems and decided he  wanted to go ahead and have this repaired.  Preoperatively, he had a  normal manometry and I reviewed his upper GI which really did not show  much in the way of a hiatal hernia but does have reflux.   SURGEON:  Thornton Park. Daphine Deutscher, MD.   ASSISTANT:  Clovis Pu. Cornett, M.D.   ANESTHESIA:  General endotracheal.   PROCEDURE:  Laparoscopic Nissen fundoplication with repair of the  diaphragm with two pledgeted sutures posteriorly, 3 suture wrap over a  #56 lighted bougie.   DESCRIPTION OF PROCEDURE:  Mr. Fallert was taken to room #11 on Friday,  June 04, 2008, and given general anesthesia.  The abdomen was prepped  with Techni-Care and draped sterilely.  Abdomen was entered through the  left upper quadrant using a 0 degree 11 OptiVu without difficulty and  then another 11 was used above the umbilicus to the left of midline for  camera and then two ports were used again that was on the right side.  A  5 mm was placed in the upper midline where a retractor was inserted  using the [employer]  This was fixed to the bed with the iron  intern and the liver was elevated.   I began my dissection in the pars flaccida region taking down the  gastrohepatic ligament exposing the right hemidiaphragm and incising  that along with carrying this anteriorly and taking this over to the  patient's left crus.   Next, I took down the short  gastrics beginning about a third of the way  down and took these up and really mobilized the top part of the stomach  very well using a harmonic scalpel.  Bleeding was controlled.  I got  around the esophagogastric junction with a Penrose and held it in the  abdomen and got good esophageal length into the abdomen.   I then repaired the diaphragm with two pledgeted sutures posteriorly  using Endo stitch device and extracorporeal ties.  Dr. Shireen Quan then  passed the #56 lighted bougie and I brought around a portion of the  cardia and then basically invaginated the distal esophagus in this wrap.  This was sutured in place with 3 sutures for top 2 taking free 2-0  Surgidac with tie knots affixing it to the esophagus and then the third  with the Endo stitch in an intracorporeal fashion.  We also used some  Tisseel to go between the wrapped stomach and esophagus, both midline  and  over on the left side.  Hemostasis was present.  Everything looked  good.  The Nathanson retractor was removed and then the  abdomen was deflated.  Wounds were injected with 0.50% Marcaine and  closed with 4-0 Vicryl with Benzoin and Steri-Strips.  The patient  seemed to tolerate the procedure well and was taken to the recovery room  in satisfactory condition.      Th[place] Park Daphine Deutscher, MD  Electronically Signed     MBM/MEDQ  D:  06/04/2008  T:  06/04/2008  Job:  [id number]   cc:   Petra Kuba, M.D.  Fax: [phone]   Jamison Neighbor, M.D.  Fax: [phone]

## 2011-03-30 NOTE — Op Note (Signed)
[facility]  Patient:    [name], [name]                       MRN: [id number] Proc. Date: 03/28/01 Adm. 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  PROCEDURE:  Bilateral test stimulation plus first-stage InterStim implantation.  SURGEON:  Jamison Neighbor, M.D.  ANESTHESIA:  IV sedation plus local anesthetic.  COMPLICATIONS:  None.  DRAINS:  None.  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  This is all felt to be secondary to interstitial cystitis.  The patient does not respond to any of the standard forms of therapy.  His management is complicated by the fact that he does not tolerate any form of oral therapy, as he has multiple and extensive drug allergies.  The patient is interested in InterStim implantation.  This has been explained to him in great detail.  He understands the risks and benefits of the procedure, including the fact that there is certainly no guarantee this will help to control anything that has to do with pain.  This is being implanted simply to try and control his dysfunctional voiding.  The patient gave full and informed consent.  This is a first-stage implant to be done under sedation so that the patient can tell if he feels it is in an appropriate location.  DESCRIPTION OF PROCEDURE:  After adequate IV sedation was obtained, the patient was placed on a prone position on a Wilson frame.  He was then prepped for a full 10 minutes with both scrub and paint.  He was draped, and a Vi-drape was  placed.  The patient has received double antibiotic therapy and in preparation for the procedure, he has also had Hibiclens showers and preparation for the procedure, has been on antibiotics preoperatively.  The fluoroscopic unit was used to determine the appropriate level for the third sacral foramen.  This was marked out on the patients skin.  The needle was then passed through the foramen on both the right and the left side, and test stimulation was performed.  There was a far better response on the right side with sensation in the rectal area, dorsiflexion of the great toe, and an appropriate bellows response.  On the left-hand side, a lesser response was obtained, so the right-hand side was picked.  After additional infiltration of a mixture of Marcaine and Xylocaine, an incision was made directly over the third sacral foramen.  A guidewire was passed down through the foramen needle which was removed, and the introducer sheath was introduced.  The quadripolar lead was then passed down through the introducer to a point beyond the optimal location.  The anchor was placed but not locked down.  This was sutured in place with permanent suture.  Under fluoroscopy, the quadripolar lead was slowly pulled back until the optimal response was obtained on fluoroscopy. This demonstrated perfect position just underneath the sacrum.  With this, there was a response on leads 1, 2, and 3, with a partial response on  lead 0. This was felt to be an optimal location.  The anchor was locked.  This was rechecked again, and a perfect stimulation was noted.  The position was checked again with fluoroscopy and appeared to be perfect.  Permanent pictures were taken.  A small incision was made in the top of the left buttocks, and a pocket was made for the connections to be placed.  A tunneling tool was used to pull the wire from the quadripolar lead over to the small lateral incision. A second tunneling tool was  used to take the lead extension out to a small stab wound in the top of the right buttocks.  The connection was made in the usual fashion and then covered with the protective booty, and then this was tied down with silk sutures.  The two incisions were then irrigated with a total of one liter of antibiotic solution.  The incisions were closed with Vicryl and surgical clips.  Dressings of gauze and Tegaderm were placed.  The patient tolerated the procedure well and was taken to the recovery room in good condition.  He will given a prescription for Cipro and Lorcet Plus.  He will return to the office in one week for removal of the staples.  The patient will be started on a test stimulation program today, and we will determine at that point if he is a candidate for second-stage implantation. DD:  03/28/01 TD:  03/29/01 Job: [id number]

## 2011-03-30 NOTE — Procedures (Signed)
[place]  Patient:    [name], [name]                       MRN: [id number] Proc. Date: 02/27/00 Adm. Date:  [id number] Attending:  Nelda Marseille CC:         Petra Kuba, M.D.             Willis Modena. Dreiling, M.D.             Jamison Neighbor, M.D.             Darci Needle, M.D.             Lucky Cowboy, M.D.                           Procedure Report  PROCEDURE PERFORMED:  Esophagogastroduodenoscopy with biopsies.  ENDOSCOPIST:  Petra Kuba, M.D.  INDICATIONS FOR PROCEDURE:  Patient with increasing reflux not responding to the usual medicines due to side effects and inability to take it.  Consent was signed after risks, benefits, methods, and options were thoroughly discussed multiple times in the past.  MEDICATIONS USED:  Demerol 100 mg, Versed 10 mg.  DESCRIPTION OF PROCEDURE:  The video endoscope was inserted by direct vision. His esophagus was tortuous.  There was no sign of esophagitis.  In the distal esophagus was a small hiatal hernia with a benign-appearing, widely patent fibrous ring just above it.  There was no signs of Barretts.  The scope passed easily into the stomach where some mild to moderate gastritis was seen and advanced through a normal pylorus into a normal duodenal bulb and around the C-loop to a normal second portion of the duodenum.  The ampulla was seen and was normal.  The scope was withdrawn back to the bulb and a good look there ruled out abnormalities in that location.  Scope was withdrawn back to the stomach and retroflexed.  High in the cardia, the hiatal hernia was confirmed. The fundus, angularis, lesser and greater curve were evaluated on retroflex and then straight visualization and other than a mild to moderate gastritis, no abnormalities were seen.  One biopsy of the antrum for the CLO test was obtained and then two of the antrum and two of the fundus as well to confirm the gastritis and rule  out Helicobacter pylori.  Scope was then slowly withdrawn through the esophagus which confirmed the above findings.  The scope was then advanced to the distal esophagus and a few scattered biopsies were obtained to rule out microscopic reflux changes.  The scope was removed.  The patient was tolerated the procedure well.  There was no obvious immediate complication.  ENDOSCOPIC DIAGNOSIS: 1. Small hiatal hernia. 2. Widely patent thin ring just above it. 3. Mild to moderate gastritis status post CLO and biopsy. 4. Otherwise within normal limits EGD status post distal esophageal biopsies.  PLAN:  Await pathology.  Trial of ____________ and then Protonix since he has not tried those.  Call p.r.n. and otherwise follow up in two months to determine any further work-up and plans. DD:  02/27/00 TD:  02/27/00 Job: [id number]

## 2011-03-30 NOTE — Op Note (Signed)
NAME:  Erik, [name] NO.:  [id number]   MEDICAL RECORD NO.:  [id number]                   PATIENT TYPE:  AMB   LOCATION:  [facility]                                 FACILITY:  [facility]   PHYSICIAN:  Jamison Neighbor, M.D.               DATE OF BIRTH:  [birth year]   DATE OF PROCEDURE:  02/12/2003  DATE OF DISCHARGE:                                 OPERATIVE REPORT   SERVICE:  Urology.   PREOPERATIVE DIAGNOSES:  1. Undesired Interstim.  2. Interstitial cystitis.   POSTOPERATIVE DIAGNOSES:  1. Undesired Interstim.  2. Interstitial cystitis.   PROCEDURE:  Removal of Interstim (first and second stage). Secondary  procedure is cystoscopy, hydrodistention of the bladder, Marcaine and  Pyridium installation.   SURGEON:  Jamison Neighbor, M.D.   ANESTHESIA:  General.   COMPLICATIONS:  None.   DRAINS:  None.   BRIEF HISTORY:  This 65 year old male has lower urinary tract dysfunctional  voiding felt to be secondary to interstitial cystitis. The patient does not  respond well to medications, he does not tolerate installation in particular  has a difficult time with almost any medication to be taken by mouth. This  is due to severe upper GI problems. The patient had an Interstim in place  that has worked for a number of years. The patient had recently decided he  no longer wished to have the Interstim in place. We tried to convince him to  allow [name] to reprogram this and he has decided he no longer wants the device.  Part of the reason for his changing his mind is the fact he is having some  back problems and would like to have an MRI and he realizes the MRI cannot  be done with the Interstim in place. He has been told that there are  certainly alternatives to the MRI such as a CT myelogram that would give  comparable information but the patient has made the decision and has  requested that the device be removed. He understands the risks and benefits  of the  procedure, full and informed consent was obtained.   DESCRIPTION OF PROCEDURE:  After successful induction of general anesthesia,  the patient was placed in the prone position, prepped with Betadine and  draped in the usual sterile fashion. An incision was made in the paramedian  location where the S3 stimulator was identified.  Dissection was carried  down to the S3 lead. The sutures that attached the anchor to the periosteum  were removed and the lead was pulled up out of the foramen. A second  incision was then made over the pulse generator, the generator was removed,  the wire connecting the generator to the lead was cut and the entire device  was removed. Both incisions were irrigated and then closed with 2-0 Vicryl  and surgical staples. A dry dressing was applied. The patient was  then moved  to another OR table and repositioned into the dorsal lithotomy position. He  was prepped and draped once again. Cystoscopy was performed and the urethra  was visualized in its entirety and was found to be normal. Beyond the  verumontanum, the prostate was wide open with no signs of obstruction of any  kind. The patient had no bladder neck and no residual prostate tissue to  speak of. The bladder was carefully inspected and was free of any tumor or  stones. Both ureteral orifices were normal in configuration and location and  clear urine was seen to efflux from each. The bladder was then distended at  a pressure of 100 cm of water for five minutes and when the bladder was  drained the bladder capacity was found to be 700 mL which is less than the  average normal patient of 1150 but somewhat more than the average  interstitial cystitis patient at 575. Glomerulations were seen and there was  a terminal blood tinge. There were no real ulcers and there was no need for  a bladder biopsy. The patient had Marcaine and Pyridium left within the  bladder. The patient tolerated the procedure well and he  received  intraoperative Toradol and Zofran and did receive a B&O suppository. He will  be sent home with a prescription for Lorcet plus as well as Keflex and  return to see [name] for staple removal and further followup.                                               Jamison Neighbor, M.D.    RJE/MEDQ  D:  02/12/2003  T:  02/12/2003  Job:  [id number]

## 2011-03-30 NOTE — Op Note (Signed)
[facility]  Patient:    [name], [name]                       MRN: [id number] Proc. Date: 04/15/01 Adm. Date:  [id number] Attending:  Londell Moh                           Operative Report  PREOPERATIVE DIAGNOSES: 1. Interstitial cystitis. 2. Urgency and frequency.  POSTOPERATIVE DIAGNOSES: 1. Interstitial cystitis. 2. Urgency and frequency.  OPERATION: Second stage InterStim implantation.  SURGEON:  Jamison Neighbor, M.D.  ANESTHESIA:  General.  COMPLICATIONS:  None.  DRAIN:  None.  BRIEF HISTORY:  This 65 year old male has severe urgency and frequency secondary to underlying interstitial cystitis.  The patient has had extensive evaluation and therapy with very little working for him.  The principal problem he has is inability to tolerate most forms of oral therapy; so medications to control his urgency or medications to control his interstitial cystitis have been very poorly tolerated.  The patient was felt to be a candidate for InterStim implantation, and he underwent first stage Interstim implantation and noticed a demonstrable decrease in his urinary frequency and improvement in his control.  The patient has had the opportunity to use the external stimulator for several weeks, and he is very pleased with the results and would like to go ahead and have the second stage placed.  The patient understands the risks and benefits of the procedure including the possibility of infection.  He gave full and informed consent.  DESCRIPTION OF PROCEDURE:  After successful induction of general anesthesia, the patient was placed in the prone position, prepped with Betadine for a full 10 minutes.  He had a sterile mosquito clamp placed on the wires so that the wire would not be pulled into the incision.  He as then draped including a Vi-Drape.  The site where the connection from the external lead to the quadripolar lead was identified and was  opened.  There were no signs of any infection.  A pocket was made for the IPG.  The old connection was taken down, and the booty was removed.  The booty was placed over the lead, and a new connection was made to the IPG using the usual lead extender.  The entire area was then carefully flushed with antibiotic solution.  The IPG was placed, and the incision was then further flushed with a total 1 liter of triple antibiotic solution.  All the irrigant was removed.  The patient had the incision infiltrated with Marcaine with epinephrine.  Skin was closed with 2-0 Vicryl and surgical clips.  The patient tolerated the procedure well and was taken to the recovery room in good condition. DD:  04/15/01 TD:  04/15/01 Job: [id number]

## 2011-03-30 NOTE — H&P (Signed)
NAME:  [name], [name] NO.:  [id number]   MEDICAL RECORD NO.:  [id number]          PATIENT TYPE:  EMS   LOCATION:  MAJO                         FACILITY:  [facility]   PHYSICIAN:  Melissa L. Ladona Ridgel, MD  DATE OF BIRTH:  Nov 17, 1945   DATE OF ADMISSION:  09/29/2006  DATE OF DISCHARGE:                              HISTORY & PHYSICAL   CHIEF COMPLAINT:  Chest pain.   PRIMARY CARE PHYSICIAN:  Joycelyn Rua, M.D.   HISTORY OF PRESENT ILLNESS:  The patient is a 65 year old white male  with past medical history significant for fibromyalgia, diverticulitis,  irritable bowel syndrome and interstitial cystitis.  She states that at  approximately 11 a.m. he developed centrally located chest pain, ranging  from 6-7 on a 10, lasting for about half an hour.  He tried to treat  that with Tums and had no real relief.  The pain finally went away; but  this evening while he was getting ready for bed the pain returned.  This  time it was different, a little more intense.  It was 8/10.  Evidently  the patient took more Tums and some sodium bicarbonate without relief.  He had no associated nausea, sweating or radiation of the symptoms;  nothing made it worse, nothing made it better and it just went away on  its own.   REVIEW OF SYMPTOMS:  No fever, no chills.  No nausea and no vomiting.  Positive heartburn.  No weight loss or gain.  Positive shortness of  breath with stairs.  He sometimes awakens from sleep with shortness of  breath. All other review of systems are negative.   PAST MEDICAL HISTORY:  1. Hiatal hernia, for which he sees Dr. Ewing Schlein.  2. Interstitial cystitis, for which he sees Dr. Logan Bores.  3. Irritable bowel syndrome,  4. Diverticulitis.  5. Fibromyalgia.   PAST SURGICAL HISTORY:  1. Tonsillectomy.  2. Circumcision  3. Vasectomy.  4. Right knee.  5. Bladder procedures.  6. TURP.   FAMILY HISTORY:  His mom is deceased with an MI.  Dad is living with  stroke, hip  fracture, dementia, glaucoma and prostatism.   SOCIAL HISTORY:  The patient smoked in his teens, but has not done so  since.  He denies alcohol.  He is disabled since 1999.  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   ALLERGIES:  Multiple intolerances but no real allergies.   MEDICATIONS:  1. Klonopin 0.5 mg q.h.s.  2. Flexeril 5 mg as needed.  3. Toradol.   PHYSICAL EXAMINATION:  VITAL SIGNS:  Temperature 97.6, blood pressure  137/81, pulse 57, respirations 16, saturations 97%.  GENERAL:  This is a well developed, well nourished white male in no  acute distress.  HEENT:  Normocephalic and atraumatic.  Pupils are equal, round and  reactive to light.  Extraocular movements intact.  Mucous membranes are  moist.  NECK:  Supple.  There is no JVD, no lymph nodes, no carotid bruits.  CHEST:  Clear to auscultation. There is no rhonchi, rales or wheezes.  CARDIOVASCULAR: Regular rate and rhythm; positive S1 and S2.  No S3, S4.  No murmurs, rubs or gallops.  ABDOMEN:  Soft with minimal tenderness in the epigastric area to  palpation.  There is no guarding or rebound.  EXTREMITIES:  Show mild trauma to the left shin; otherwise no edema.  NEUROLOGIC:  He is awake, alert and oriented.  Cranial nerves II-XII are  intact.  Power 5/5.   LABORATORY DATA:  Sodium 138, potassium 3.7, chloride 105, CO2 30, BUN  20, creatinine 1.6, glucose 105.  His LFTs are within normal limits.  His point-of-care markers negative x2.  D-dimer 0.2 within normal limits   DIAGNOSTIC TESTING:  His chest x-ray shows no acute disease.  EKG shows  67 beats per minute with nonspecific ST-T wave changes (compared to his  EKG in 2004).   ASSESSMENT AND PLAN:  This is a 65 year old white male with multiple  episodes of atypical chest pain.  No significant risk factors are  present.  There is a component of epigastric tenderness, which may  suggest in the context of having increased pain with lying down   --  his  hiatal hernia is possibly causing him some discomfort.  1. CARDIOVASCULAR.  Treat with aspirin and Lovenox.  Mild bradycardia      precludes his beta blocker.  Will consult Dr. Katrinka Blazing in the morning.  2. PULMONARY.  Chest x-ray is negative.  D-dimer is negative.  I      suspect that his nighttime waking with shortness of breath may be      related to reflux.  However, the patient relates he has been      intolerant to his multiple proton pump inhibitors, they cause him      to stay awake.  We will, however, continue the Pepcid even twice a      day.  3. GI.  Continue Pepcid twice daily.  4. GU.  Interstitial cystitis, treated by Dr. Logan Bores.  Currently on no      medications.  5. ENDOCRINE.  No current issues.  6. INFECTIOUS DISEASE.  Will check a CBC.  7. DVT PROPHYLAXIS.  This will be with the Lovenox.      Melissa L. Ladona Ridgel, MD  Electronically Signed     MLT/MEDQ  D:  09/29/2006  T:  09/29/2006  Job:  [id number]   cc:   Joycelyn Rua, M.D.

## 2011-03-30 NOTE — Op Note (Signed)
NAME:  [name], [name] NO.:  [id number]   MEDICAL RECORD NO.:  [id number]          PATIENT TYPE:  AMB   LOCATION:  NESC                         FACILITY:  [facility]   PHYSICIAN:  Jamison Neighbor, M.D.  DATE OF BIRTH:  Nov 17, 1945   DATE OF PROCEDURE:  09/26/2004  DATE OF DISCHARGE:                                 OPERATIVE REPORT   PREOPERATIVE DIAGNOSIS:  Chronic pelvic pain, interstitial cystitis.   POSTOPERATIVE DIAGNOSIS:  Chronic pelvic pain, interstitial cystitis.   PROCEDURE:  Cystoscopy, hydrodistention of the bladder, Marcaine and  Pyridium instillation.   SURGEON:  Jamison Neighbor, M.D.   ANESTHESIA:  General.   COMPLICATIONS:  None.   DRAINS:  None.   BRIEF HISTORY:  This 65 year old male has chronic pelvic pain felt to be  secondary to interstitial cystitis.  Patient has previously undergone TURP  and has no problems with bladder outlet obstruction but still has urgency,  frequency, and pain.  Unfortunately, by his own admission, he is intolerant  of all forms of medication and truly cannot take anything, either by  instillation or by an oral fashion without having complications and  problems.  The only thing that he can take at this point include Prelief to  modify his diet and Klonopin 1 at night.  The patient has requested repeat  hydrodistention be performed.  He does have good results from this.  He  understands that there is no guarantee that will occur.  He gave full  informed consent.   PROCEDURE:  After the successful induction of general anesthesia, the  patient is placed in a dorsal lithotomy position, prepped with Betadine, and  draped in the usual sterile fashion.  Cystoscopy was performed.  The urethra  was visualized in its entirety and found to be normal.  Beyond the  verumontanum , there was a wide-open prostatic fossa.  The bladder itself  was carefully inspected.  It was free of any tumor or stone.  Both ureteral  orifices were  normal in configuration and location.  Hydrodistention of the  bladder was then performed.  The patient was found to have an 800 cc  capacity bladder, which was generally fairly good.  He had some  glomerulations seen.  There were no ulcers.  There was nothing to require  biopsy.  The patient had Marcaine and Pyridium left in the bladder.  He  tolerated the procedure well and was taken to the recovery room in good  condition and will be sent home with pain medication as well as just two  days of antibiotic therapy.      RJE/MEDQ  D:  09/26/2004  T:  09/26/2004  Job:  [id number]

## 2011-03-30 NOTE — Cardiovascular Report (Signed)
NAME:  [name], [name] NO.:  [id number]   MEDICAL RECORD NO.:  [id number]          PATIENT TYPE:  OBV   LOCATION:  4708                         FACILITY:  [facility]   PHYSICIAN:  Lyn Records, M.D.   DATE OF BIRTH:  22-Sep-1946   DATE OF PROCEDURE:  09/30/2006  DATE OF DISCHARGE:                            CARDIAC CATHETERIZATION   INDICATIONS:  Recurring chest discomfort with both typical and atypical  features in a patient with a family history of coronary artery disease  and hyperlipidemia.   PROCEDURES PERFORMED:  1. Left heart cath.  2. Selective coronary angio.  3. Left ventriculography.  4. Aortography.   DESCRIPTION:  After informed consent, a 6-French sheath was placed in  the right femoral artery using modified Seldinger technique.  A 6-French  A2 multipurpose catheter was then used for hemodynamic recordings, left  ventriculography by hand injection, and left coronary angiography.  A  Judkins right catheter was used for right coronary angiography.  Multipurpose catheter was also used for hand injection aortography at 30  frames per second.  The patient tolerated the procedure without  complications.  Angio-Seal was not performed because of entry near a  bifurcation.  Hemostasis was achieved by manual compression without  complications.   RESULTS:  1. Hemodynamic data:      a.     Aortic pressure 103/65.      b.     Left ventricular pressure 110/8.   1. Left ventriculography:  Left ventricular cavity size and function      are normal.  EF 75%.  No significant MR noted.   1. Aortography:  No evidence of annuloaortic ectasia or ascending      aortic root aneurysm is identified by screening aortography.   1. Coronary angiography.      a.     Left main coronary:  Relatively short, widely patent.      b.     Left anterior descending coronary:  This is a large vessel       that wraps around the left ventricular apex.  After a large       diagonal in  the mid vessel, there is mild systolic compression of       the LAD.  There is also focal stenosis in the LAD beyond the first       diagonal that obstructs the vessel by up to 40-50%.  This region       of narrowing appears most noticeable when viewed in the LAO       cranial projection and even in that view, there is not significant       focal obstructions seen.  This particular view does give some       appearance of haziness, however, some overlap is present that may       be causing this appearance.  The ostium of the diagonal contains       40% narrowing.  The diagonal is relatively large.  There is       calcification noted in the proximal LAD before the diagonal  origin.      c.     Circumflex artery:  The circumflex coronary artery is a       large vessel that gives origin to two bifurcating obtuse marginal       branches.  Irregularities are noted within the mid body of the       circumflex.  No significant obstruction is felt to be present.      d.     Right coronary:  The right coronary artery is large, having       no significant obstruction noted.  Irregularities are noted in the       proximal and mid body of the right coronary.  The right coronary       is very tortuous.   CONCLUSION:  1. No evidence of obstructive coronary disease is noted.  There is      plaquing with calcification in the left anterior descending.  The      most severe region of apparent narrowing in the left anterior      descending is just beyond the first diagonal, where there is 40 to      perhaps 50% narrowing depending upon the view visualized.  The      diagonal origin contains 25-40% narrowing.  Irregularities are      noted in the circumflex and in the right coronary mid body.  2. Normal left ventricular function.  3. No evidence of annuloaortic ectasia or aortic a sending aneurysm.  4. Normal left ventricular function.  5. Chest discomfort, etiology uncertain.  No high-grade  obstructive      coronary lesions are noted.   RECOMMENDATIONS:  1. Will add Plavix for 6-8 weeks in case the mid left anterior      descending lesion represents a nonobstructive ruptured plaque.  2. Risk factor modification including Statin therapy to drive LDL      cholesterol to less than 70.  3. If recurrent chest pain, sublingual nitroglycerin should be used.  4. If symptoms continue, functional testing would be a consideration.      Lyn Records, M.D.  Electronically Signed     HWS/MEDQ  D:  09/30/2006  T:  09/30/2006  Job:  [id number]   cc:   Joycelyn Rua, M.D.

## 2011-03-30 NOTE — Consult Note (Signed)
NAME:  [name], PICKUP NO.:  [id number]   MEDICAL RECORD NO.:  [id number]          PATIENT TYPE:  OBV   LOCATION:  4729                         FACILITY:  [facility]   PHYSICIAN:  Georga Hacking, M.D.DATE OF BIRTH:  22-Jul-1946   DATE OF CONSULTATION:  09/29/2006  DATE OF DISCHARGE:                                 CONSULTATION   CARDIAC CONSULTATION:   REASON FOR CONSULTATION:  Thanks for asking me to see this 65 year old  male for cardiac consultation regarding chest discomfort.  The patient  has a previous diagnosis of fibromyalgia and interstitial cystitis for  which he has been under disability for many years.  About a month ago he  developed difficulty speaking and some weakness.  This resolved.  Last  evening he developed midsternal chest discomfort rated as a scale of 6-7  of 10, lasting 1/2 hour, he took Tums with no real relief and the  discomfort went away but then occurred while he was getting ready for  bed.  He took more Tums and sodium bicarbonate without relief.  He had  no associated nausea, sweating or radiation of his symptoms.  His  symptoms were not made worse with exercise.  He came to the emergency  room and was admitted to rule out a myocardial infarction.  Since  admission he has had recurrence of the pain and was given nitroglycerin  with relief.  Initial cardiac enzymes and EKG were unremarkable.   He was evaluated by Dr. Katrinka Blazing several years ago with a stress Cardiolite  test and has seen him somewhat since then.   PAST MEDICAL HISTORY:  1. Medical:  History of hiatal hernia and some mild reflux      esophagitis.  He has a known history of interstitial cystitis and      irritable bladder.  He has a history of irritable bowel syndrome      and diverticulosis and he carries a diagnosis of fibromyalgia.  He      has hyperlipidemia but is intolerant to the cholesterol      medications.  2. Past surgical history:  Tonsillectomy,  circumcision, vasectomy,      right knee surgery, bladder procedures and TURP.   FAMILY HISTORY:  Mother died of myocardial infarction.  Father is living  with a stroke, hip fracture, dementia, glaucoma and prostatism.   SOCIAL HISTORY:  He smoked in his teens but has remained abstinent since  then.  He lives with his wife.  He has been disabled since 1999.   CURRENT MEDICATIONS:  Klonopin q.h.s., Flexeril as needed and Toradol.   REVIEW OF SYSTEMS:  He is somewhat weak and has malaise and fatigue  normally.  He normally denies anginal pain with exertion.  He has no  PND, orthopnea or edema and does not have claudication.  He has symptoms  of irritable bowel syndrome and episodic diarrhea.  He has had bilateral  knee pain in the past.  He has had a history of interstitial cystitis  and has had urinary frequency.  As noted above he had some difficulty  with speech around 1 month ago.  He does have a history of fibromyalgia  and diffuse arthritis.   PHYSICAL EXAMINATION:  GENERAL:  He is a pleasant male appearing stated  age in no acute distress.  VITAL SIGNS:  His blood pressure is currently 115/75, pulse was 67.  SKIN:  Warm and dry.  ENT:  EOMI.  PERLA.  C&S clear.  Pharynx negative.  NECK:  Supple without masses, JVD, thyromegaly or bruits.  LUNGS:  Clear to A&P.  CARDIAC EXAM:  Normal S1, S2, no S3, S4 or murmur.  ABDOMEN:  Soft, nontender, no masses, hepatosplenomegaly, aneurysm.  Femoral distal pulses 2+.  SKIN:  Somewhat sweaty.  NEUROLOGIC:  Normal.   ELECTROCARDIOGRAM:  A 12-lead EKG shows poor R wave progression,  borderline QRS complex, IV conduction delay (type undetermined).   LABORATORY DATA:  Normal CBC.  He has a normal chemistry panel except  for a glucose of 101.  His cholesterol is 219 with an HDL of 38 and LDL  of 152.   IMPRESSION:  1. Chest discomfort with some typical/other atypical features with      relief with nitroglycerin.  2. History of  fibromyalgia.  3. History of irritable bowel syndrome.  4. History of interstitial cystitis.  5. Hyperlipidemia with intolerances to lipid lowering medicines.   RECOMMENDATIONS:  He has had recurrent chest pain relieved with  nitroglycerin with negative enzymes at this point.  Because he has had  recurrent pain at rest in the hospital relieved with nitroglycerin it  may be best to go ahead with catheterization to evaluate him.  Alternatively if he remains free of pain could consider repeat stress  testing as a nuclear stress test but with his other history,  hyperlipidemia and chest pain relieved with nitroglycerin, it may be  best just to find out if he has coronary disease or not.  Dr. Katrinka Blazing is  his primary cardiologist.  I will leave the decision up to him as to  which he wishes to do.  We will keep the patient n.p.o. after midnight.      Georga Hacking, M.D.  Electronically Signed     WST/MEDQ  D:  09/29/2006  T:  09/29/2006  Job:  [id number]   cc:   Royetta Crochet, MD  Lyn Records, M.D.

## 2011-03-30 NOTE — Op Note (Signed)
NAME:  [name], [name] NO.:  [id number]   MEDICAL RECORD NO.:  [id number]                   PATIENT TYPE:  AMB   LOCATION:  ENDO                                 [facility]ACILITY:  [facility]   PHYSICIAN:  Petra Kuba, M.D.                 DATE OF BIRTH:  [birth year]   DATE OF PROCEDURE:  02/18/2004  DATE OF DISCHARGE:                                 OPERATIVE REPORT   PROCEDURE:  Esophagogastroduodenoscopy with biopsy.   INDICATIONS FOR PROCEDURE:  A patient with upper tract symptoms want to  reevaluate his esophagus.  Consent was signed after risks, benefits,  methods, and options were thoroughly discussed multiple times in the past.   MEDICINES USED:  Demerol 100, Versed 10.   DESCRIPTION OF PROCEDURE:  The video endoscope was inserted by direct  vision.  A quick look at the vocal cords were normal. The scope was inserted  through a normal proximal and mid esophagus. In the distal esophagus was a  small hiatal hernia and one small linear erosion. The scope passed in the  stomach where some proximal gastritis was seen, advanced to a normal antrum,  normal pylorus into a normal duodenal bulb and around the C loop to a normal  second portion of the duodenum. The scope was withdrawn back to the bulb and  a good look there ruled out ulcers in that location. The scope was withdrawn  back to the stomach and retroflexed. The proximal gastritis was confirmed,  it was mild. High in the cardia, the hiatal hernia was confirmed. The  angularis, lesser and greater curve were normal. Straight visualization of  the stomach did not reveal any additional findings. The scope was then  slowly withdrawn to 20 cm confirming above esophageal findings.  The scope  was then advanced to the distal esophagus, a few biopsies of the erosion in  the distal esophagus were obtained.  The scope was then slowly withdrawn  again confirming normal proximal and mid esophagus. The scope was  removed.  The patient tolerated the procedure well. There was no obvious or immediate  complications.   ENDOSCOPIC DIAGNOSIS:  1. Small hiatal hernia with linear erosion biopsied.  2. Proximal mild gastritis.  3. Otherwise normal esophagogastroduodenoscopy.   PLAN:  Try even a lower dose of the __________ since it seems to help but  side effects are limiting his use of pump inhibitors and H2 blockers.  Would  like to rediscuss surgery options with him and probably will need a repeat  manometry prior which he has had some trouble tolerating in the past.                                               Petra Kuba, M.D.  MEM/MEDQ  D:  02/18/2004  T:  02/18/2004  Job:  [id number]   cc:   Lucky Cowboy, M.D.  [address] [place]  [place] [address]  Fax: [phone]   Jamison Neighbor, M.D.  [address]  [place]  [place] [address]  Fax: [phone]   Meredith Staggers, M.D.  [address]  [place]  [place] [address]  Fax: [phone]

## 2011-03-30 NOTE — Op Note (Signed)
[facility]  Patient:    [name], [name] Visit Number: [id number] MRN: [id number]          Service Type: NES Location: NESC Attending Physician:  Londell Moh Dictated by:   Jamison Neighbor, M.D. Proc. Date: 02/06/02 Admit Date:  02/06/2002   CC:         Earl Lites D. Marina Goodell, M.D.   Operative Report  SERVICE:  Urology.  PREOPERATIVE DIAGNOSIS:  Pelvic pain consistent with interstitial cystitis.  POSTOPERATIVE DIAGNOSIS:  Pelvic pain consistent with interstitial cystitis.  PROCEDURE:  Cystoscopy, hydrodistention of the bladder, Marcaine and Pyridium installation.  SURGEON:  Jamison Neighbor, M.D.  ANESTHESIA:  General.  COMPLICATIONS:  None.  DRAINS:  None.  BRIEF HISTORY:  This 65 year old male has problems with chronic pelvic pain along with fibromyalgia. He is felt to have interstitial cystitis. The patient cannot tolerate installation therapy particularly well and does very poorly with most forms of oral medication. The patient does have an interstim in place which has stopped his urgency and frequency but he still suffers from pelvic pain. He is to undergo a repeat diagnostic cystoscopy. The has always responded to this in the past and he hopes he will have some improvement in his symptoms with distention. He certainly knows there is no guarantee he will have comparable results to what he had in the past. He gave full and informed consent.  DESCRIPTION OF PROCEDURE:  After successful induction of general anesthesia, the patient was placed in the dorsal lithotomy position, prepped with Betadine and draped in the usual sterile fashion. Cystoscopy was performed, the urethra was visualized in its entirety. Beyond the verumontanum, there was a wide open prostatic fossa. The patient previously had a TURP done and he has absolutely no obstruction. The bladder itself was carefully inspected and was free of any tumor or stones. Both  ureteral orifices were normal in configuration and location. The bladder was distended at a pressure of 100 cm of water for five minutes when the bladder was drained. The bladder capacity was somewhat diminished at 700 cc, glomerulations were seen. This was felt to be consistent with his longstanding diagnosis of interstitial cystitis. Marcaine and Pyridium were left in the bladder. Lidocaine jelly was placed in the urethra. A B&O suppository was inserted. He was given intraoperative Toradol and Zofran. He tolerated the procedure well and was taken to the recovery room in good condition. He will be sent home with Tylox as well as Levaquin. The patient will return in routine follow-up. We will reprogram him at that time. Dictated by:   Jamison Neighbor, M.D. Attending Physician:  Londell Moh DD:  02/06/02 TD:  02/06/02 Job: [id number]

## 2011-03-30 NOTE — Procedures (Signed)
[facility]  Patient:    [name], [name]                       MRN: [id number] Proc. Date: 02/19/01 Adm. Date:  [id number] Disc. Date: [id number] Attending:  Annamarie Dawley CC:         Jamison Neighbor, M.D.   Procedure Report  PROCEDURE:  Colonoscopy.  INDICATIONS FOR PROCEDURE:  Patient due for colonic screening with multiple GI complaints. Anorectal pain, abdominal complaints and pain.  Consent was signed after risks, benefits, methods, and options were thoroughly discussed on multiple occasions.  MEDICINES USED:  Demerol 100, Versed 10.  DESCRIPTION OF PROCEDURE:  Rectal inspection is pertinent for external hemorrhoids, small. Digital exam was negative. The video colonoscope was inserted and anorectal pull-through and retroflexion was done initially which only revealed some internal hemorrhoids and some anal papillae but no other abnormality. The scope was then straightened and fairly easily advanced around the colon to the cecum. This did not require any abdominal pressure or any position changes. No obvious abnormality was seen on insertion. The cecum was identified by the appendiceal orifice and the ileocecal valve. In fact, the scope was inserted a short ways into the terminal ileum which was normal. Photo documentation was obtained. The scope was slowly withdrawn. The prep was adequate. There was some liquid stool that required suctioning only but on slow withdrawal through the colon, no polyps, masses, signs of colitis or diverticula were seen as we slowly withdrew back to the rectum. Anorectal pull-through again confirmed the hemorrhoids. We elected not to re-retroflex since we did that on insertion. The scope was reinserted a short ways up the sigmoid, air was suctioned, the scope removed. The patient tolerated the procedure well and there was no obvious or immediate complication.  ENDOSCOPIC DIAGNOSIS: 1. Internal/external hemorrhoids  with anal papillae. 2. Otherwise within normal limits to the cecum and the terminal ileum.  PLAN:  Will go ahead and use hemorrhoidal suppositories, foams or creams as needed. Call me p.r.n. and otherwise follow-up in six months per our routine. D:  02/19/01 TD:  02/19/01 Job: [id number]

## 2011-05-21 ENCOUNTER — Ambulatory Visit: Payer: Self-pay | Admitting: Internal Medicine

## 2011-06-26 ENCOUNTER — Inpatient Hospital Stay (INDEPENDENT_AMBULATORY_CARE_PROVIDER_SITE_OTHER)
Admission: RE | Admit: 2011-06-26 | Discharge: 2011-06-26 | Disposition: A | Discharge status: Home / Self Care | Payer: Medicare Other | Source: Ambulatory Visit | Attending: Family Medicine | Admitting: Family Medicine

## 2011-06-26 DIAGNOSIS — L989 Disorder of the skin and subcutaneous tissue, unspecified: Secondary | ICD-10-CM

## 2011-06-26 DIAGNOSIS — F411 Generalized anxiety disorder: Secondary | ICD-10-CM

## 2011-07-27 ENCOUNTER — Other Ambulatory Visit: Payer: Self-pay | Admitting: Orthopedic Surgery

## 2011-07-27 DIAGNOSIS — M25512 Pain in left shoulder: Secondary | ICD-10-CM

## 2011-07-31 ENCOUNTER — Ambulatory Visit
Admission: RE | Admit: 2011-07-31 | Discharge: 2011-07-31 | Disposition: A | Discharge status: Home / Self Care | Payer: Medicare Other | Source: Ambulatory Visit | Attending: Orthopedic Surgery | Admitting: Orthopedic Surgery

## 2011-07-31 DIAGNOSIS — M25512 Pain in left shoulder: Secondary | ICD-10-CM

## 2011-08-02 LAB — POCT HEMOGLOBIN-HEMACUE
Hemoglobin: 12.1 — ABNORMAL LOW
Hemoglobin: 7.6 — CL
Operator id: 118191
Operator id: 118191

## 2011-08-08 ENCOUNTER — Ambulatory Visit
Admission: RE | Admit: 2011-08-08 | Discharge: 2011-08-08 | Disposition: A | Discharge status: Home / Self Care | Payer: Medicare Other | Source: Ambulatory Visit | Attending: Orthopedic Surgery | Admitting: Orthopedic Surgery

## 2011-08-10 LAB — BASIC METABOLIC PANEL
BUN: 15
CO2: 30
Calcium: 9.5
Chloride: 103
Creatinine, Ser: 1.12
GFR calc non Af Amer: >60
Glucose, Bld: 106 — ABNORMAL HIGH
Potassium: 3.8
Sodium: 139

## 2011-08-10 LAB — HEMOGLOBIN AND HEMATOCRIT, BLOOD
HCT: 44.4
Hemoglobin: 15.1

## 2011-08-10 LAB — CBC
HCT: 38.5 — ABNORMAL LOW
Hemoglobin: 13
MCHC: 33.7
MCV: 90.3
Platelets: 251
RBC: 4.27
RDW: 12.7
WBC: 9.8

## 2011-10-15 DIAGNOSIS — R6882 Decreased libido: Secondary | ICD-10-CM | POA: Insufficient documentation

## 2011-10-15 DIAGNOSIS — R3915 Urgency of urination: Secondary | ICD-10-CM | POA: Insufficient documentation

## 2011-10-15 DIAGNOSIS — R350 Frequency of micturition: Secondary | ICD-10-CM | POA: Insufficient documentation

## 2011-10-15 DIAGNOSIS — F32A Depression, unspecified: Secondary | ICD-10-CM | POA: Insufficient documentation

## 2011-10-15 DIAGNOSIS — N529 Male erectile dysfunction, unspecified: Secondary | ICD-10-CM | POA: Insufficient documentation

## 2011-12-18 ENCOUNTER — Encounter: Payer: Self-pay | Admitting: Internal Medicine

## 2011-12-18 ENCOUNTER — Ambulatory Visit (INDEPENDENT_AMBULATORY_CARE_PROVIDER_SITE_OTHER): Payer: Medicare Other | Admitting: Internal Medicine

## 2011-12-18 VITALS — BP 110/82 | HR 88 | Ht 67.0 in | Wt 169.0 lb

## 2011-12-18 DIAGNOSIS — K6289 Other specified diseases of anus and rectum: Secondary | ICD-10-CM

## 2011-12-18 DIAGNOSIS — R198 Other specified symptoms and signs involving the digestive system and abdomen: Secondary | ICD-10-CM

## 2011-12-18 DIAGNOSIS — IMO0001 Reserved for inherently not codable concepts without codable children: Secondary | ICD-10-CM

## 2011-12-18 DIAGNOSIS — R141 Gas pain: Secondary | ICD-10-CM

## 2011-12-18 DIAGNOSIS — K5790 Diverticulosis of intestine, part unspecified, without perforation or abscess without bleeding: Secondary | ICD-10-CM

## 2011-12-18 DIAGNOSIS — R194 Change in bowel habit: Secondary | ICD-10-CM

## 2011-12-18 DIAGNOSIS — R197 Diarrhea, unspecified: Secondary | ICD-10-CM

## 2011-12-18 DIAGNOSIS — R1032 Left lower quadrant pain: Secondary | ICD-10-CM

## 2011-12-18 DIAGNOSIS — K5730 Diverticulosis of large intestine without perforation or abscess without bleeding: Secondary | ICD-10-CM

## 2011-12-18 DIAGNOSIS — R143 Flatulence: Secondary | ICD-10-CM

## 2011-12-18 HISTORY — DX: Diverticulosis of intestine, part unspecified, without perforation or abscess without bleeding: K57.90

## 2011-12-18 MED ORDER — PEG-KCL-NACL-NASULF-NA ASC-C 100 G PO SOLR: 1.0000 | Freq: Once | ORAL | Status: DC

## 2011-12-18 MED ORDER — DICYCLOMINE HCL 10 MG PO CAPS: 10.0000 mg | ORAL_CAPSULE | Freq: Two times a day (BID) | ORAL | Status: DC | PRN

## 2011-12-18 MED ORDER — METRONIDAZOLE 250 MG PO TABS: 250.0000 mg | ORAL_TABLET | Freq: Three times a day (TID) | ORAL | Status: AC

## 2011-12-18 NOTE — Patient Instructions (Signed)
You have been scheduled for a Colonoscopy with separate instructions given. Your prep kit has been sent to your pharmacy for you to pick up. Your prescription(s) has(have) been sent to your pharmacy for you to pick up (Bentyl, Flagyl).

## 2011-12-18 NOTE — Progress Notes (Signed)
Subjective:    Patient ID: [name], male    DOB: June 12, 1946, 66 y.o.   MRN: [id number]  HPI [name] is a 66 year old male known to Dr. Leone [name] with history of diverticular disease, IBS, and interstitial cystitis. He was last seen in 2011 at which time he was felt to have symptoms consistent consistent with irritable bowel syndrome, he did undergo workup for celiac disease which was unremarkable. He had had an episode of diverticulitis which was confirmed on CT scan in 2010.  His last colonoscopy was done in 2007 by Dr. Ewing [name] and he also had endoscopy done at that same time period the colonoscopy showed hemorrhoids and a few widemouth diverticuli in the proximal transverse colon and descending colon.  He comes in today with complaints of 6-8 month history of constant burning sensation in his rectum in his lower bowel as well as frequent diarrhea. He says he made the appointment because he is waking up more frequently with pain at night and this keeps him from sleeping. He has not noted any bleeding. He states that usually he has 2-3 loose bowel movements each morning before lunch time and then may or may not have another episode later in the day. Generally he is not having any nocturnal episodes of diarrhea. He does admit that it's difficult for him to differentiate his pain from interstitial cystitis from this pain but he says he does not usually get rectal burning and change in his bowel habits with the interstitial cystitis. His appetite has been somewhat decreased and his weight is stable. He denies any nausea or vomiting and has not noted any change in his pain with by mouth intake. He also complains of a lot of gas and abdominal bloating.  He had a urology appointment in November and says that at that time they tried a bladder insufflation which he could not tolerate and was supposed to have a followup in January which she forgot about. He says he needs to make another appointment.   Review  of Systems  Constitutional: Negative.   HENT: Negative.   Eyes: Negative.   Respiratory: Negative.   Cardiovascular: Negative.   Gastrointestinal: Positive for abdominal pain, diarrhea and rectal pain.  Genitourinary: Positive for dysuria.  Neurological: Negative.   Hematological: Negative.   Psychiatric/Behavioral: Negative.    Outpatient Prescriptions Prior to Visit  Medication Sig Dispense Refill  . hydrOXYzine (ATARAX) 50 MG tablet Take 50 mg by mouth at bedtime.       Marland Kitchen LORazepam (ATIVAN) 0.5 MG tablet Take 0.5 mg by mouth at bedtime.        . Tamsulosin HCl (FLOMAX) 0.4 MG CAPS Take 0.4 mg by mouth at bedtime.        . Vitamin D, Ergocalciferol, (DRISDOL) 50000 UNITS CAPS Take 50,000 Units by mouth every 30 (thirty) days.          Not on File Active Ambulatory Problems    Diagnosis Date Noted  . IRRITABLE BOWEL SYNDROME 06/09/2010  . INTERSTITIAL CYSTITIS 06/09/2010  . FIBROMYALGIA 06/09/2010  . FATIGUE 06/09/2010  . FLATULENCE-GAS-BLOATING 06/09/2010  . DIARRHEA 06/09/2010  . ABDOMINAL PAIN-LLQ 06/09/2010  . Diverticulosis 12/18/2011   Resolved Ambulatory Problems    Diagnosis Date Noted  . No Resolved Ambulatory Problems   Past Medical History  Diagnosis Date  . Allergic rhinitis   . Rheumatoid arthritis   . Insomnia   . Anxiety   . CAD (coronary artery disease)   . Diverticulitis   .  Fibromyalgia   . GERD with stricture   . HLD (hyperlipidemia)   . HTN (hypertension)   . Interstitial cystitis   . IBS (irritable bowel syndrome)   . Anal fissure   . Helicobacter pylori gastritis   . Depression   . Hiatal hernia   . Internal and external hemorrhoids without complication      Objective:   Physical Exam well-developed white male in no acute distress, pleasant blood pressure 110/82 pulse 88 weight 169. HEENT; non-traumatic, normocephalic, EOMI PERRLA sclera anicteric,Neck; Supple no JVD, Cardiovascular; regular rate and rhythm with S1-S2 no murmur rub or  gallop, Pulmonary; ;clear bilaterally, Abdomen; soft, minimally tender in the suprapubic area and left lower quadrant no guarding, no rebound, no palpable masses or hepatosplenomegaly BS.  are active, Rectal; Hemoccult negative brown stool, Extremities no clubbing cyanosis or edema skin warm dry, Psych; mood and affect appropriate        Assessment & Plan:  #27 66 year old male with history of diverticular disease, IBS, and interstitial cystitis with complaint of 6-8 month history of constant lower abdominal burning rectal burning and change in bowel habits with 3-4 diarrheal stools per day. He also complains of increased intestinal gas and bloating. His symptoms may be at least in part due to his interstitial cystitis and likely overlap with irritable bowel syndrome. However his pain complaints seem different and he is concerned about the persistent change in his bowel habits. Need to rule out any diverticulitis/segmental colitis picture, and/or proctitis/colitis.  Plan; Will schedule for colonoscopy with propofol, procedure discussed in detail with the patient and he is agreeable to proceed At a trial of dicyclomine 10 mg by mouth twice daily as needed and have suggested he take a dose each morning since most of his symptoms with diarrhea occur in the morning. We'll give empiric course of Flagyl 250 mg 3 times daily x10 days for possible component of bacterial overgrowth - With c/o gas and bloating.   The patient was seen by [name] PAC. I have also seen the patient. Suspect this is probably some sort of IBS issue but he's had a change in bowel habits. Will treat with anti-spasmodic and also empiric metronidazole. If that resolves everything he may not need colonoscopy but we'll plan for one with propofol sedation given high amounts of moderate sedation with additional Benadryl when his previous gastroenterologist sedated him.  Erik Boop, MD, [name]

## 2011-12-19 ENCOUNTER — Encounter: Payer: Self-pay | Admitting: Internal Medicine

## 2011-12-31 ENCOUNTER — Telehealth: Payer: Self-pay | Admitting: Internal Medicine

## 2011-12-31 NOTE — Telephone Encounter (Signed)
Patient stopped the flagyl and  bentyl due to insomnia. He only had a few pills left of the flagyl.  He is asking if he should resume on of the medications?  His symptoms are improved, but not resolved.  He also wanted [name] to be aware that he had a CT scan at [facility] last year and will have it sent here prior to his procedure.  I have advised him he can try taking bentyl today and I will have Dr Leone Payor review and advise on flagyl when he returns tomorrow.  He is scheduled for colon with propofol on 3/14.

## 2012-01-01 NOTE — Telephone Encounter (Signed)
Strange that these would cause insomnia  At this point would use Bentyl and not restart metronidazole

## 2012-01-01 NOTE — Telephone Encounter (Signed)
Patient advised.  He says that bentyl caused insomnia last night.  I have received a copy of the CT scan from [facility] I will put it in your office

## 2012-01-03 NOTE — Telephone Encounter (Signed)
Ok - so would not take Bentyl if he thinks it is keeping him up  CT 05/17/11 indicated epiploic appendagitis which means inflamed fat attached to intestine - can act like diverticulitis  He said he was better - please have him report exactly what symptoms he has been having over past few days (pain, bowel habits, bleeding, fever?)

## 2012-01-03 NOTE — Telephone Encounter (Signed)
Ok - then no changes - wait for colonoscopy

## 2012-01-03 NOTE — Telephone Encounter (Signed)
Patient aware.

## 2012-01-03 NOTE — Telephone Encounter (Signed)
Patient reports that he is having some rectal burning and bleeding "very slightly".   He reports having formed stools daily and no fever.  His only other symptoms are fatigue.

## 2012-01-21 ENCOUNTER — Telehealth: Payer: Self-pay | Admitting: Internal Medicine

## 2012-01-22 NOTE — Telephone Encounter (Signed)
Patient advised that there is no way to clean out the colon without causing more diarrhea.  He is advised that he will need to drink all of the prep to have a colonoscopy and that all preps clean out the colon.  He is very anxious about taking the prep.  He is advised to take his xanax prior to starting the prep.

## 2012-01-22 NOTE — Telephone Encounter (Signed)
Left message for patient to call back  

## 2012-01-23 ENCOUNTER — Telehealth: Payer: Self-pay | Admitting: Internal Medicine

## 2012-01-23 ENCOUNTER — Telehealth: Payer: Self-pay

## 2012-01-23 NOTE — Telephone Encounter (Signed)
Called pt and left message.  No diabetic meds listed on med list.  Karna Christmas who originally took pt's call said pt felt weak as if he were going to pass out.  RN left message encouraging pt to call again so we could further assess the situation and secondarily advised pt to increase fluid intake especially those with sugar if blood sugar is low i.e. Juice, popsicles and check sugar (if available) in 30 minutes.

## 2012-01-24 ENCOUNTER — Encounter: Payer: Self-pay | Admitting: Internal Medicine

## 2012-01-24 ENCOUNTER — Ambulatory Visit (AMBULATORY_SURGERY_CENTER): Payer: Medicare Other | Admitting: Internal Medicine

## 2012-01-24 VITALS — BP 153/82 | HR 92 | Temp 98.2°F | Resp 13 | Ht 67.0 in | Wt 169.0 lb

## 2012-01-24 DIAGNOSIS — R197 Diarrhea, unspecified: Secondary | ICD-10-CM

## 2012-01-24 DIAGNOSIS — R1032 Left lower quadrant pain: Secondary | ICD-10-CM

## 2012-01-24 DIAGNOSIS — K648 Other hemorrhoids: Secondary | ICD-10-CM

## 2012-01-24 DIAGNOSIS — K5790 Diverticulosis of intestine, part unspecified, without perforation or abscess without bleeding: Secondary | ICD-10-CM

## 2012-01-24 DIAGNOSIS — K6289 Other specified diseases of anus and rectum: Secondary | ICD-10-CM

## 2012-01-24 DIAGNOSIS — R198 Other specified symptoms and signs involving the digestive system and abdomen: Secondary | ICD-10-CM

## 2012-01-24 DIAGNOSIS — K5730 Diverticulosis of large intestine without perforation or abscess without bleeding: Secondary | ICD-10-CM

## 2012-01-24 DIAGNOSIS — D126 Benign neoplasm of colon, unspecified: Secondary | ICD-10-CM

## 2012-01-24 LAB — GLUCOSE, CAPILLARY
Glucose-Capillary: 97 mg/dL (ref 70–99)
Glucose-Capillary: 118 mg/dL — ABNORMAL HIGH (ref 70–99)

## 2012-01-24 MED ORDER — SODIUM CHLORIDE 0.9 % IV SOLN: 500.0000 mL | INTRAVENOUS | Status: DC

## 2012-01-24 NOTE — Patient Instructions (Addendum)
The colon really looked ok - you do have some internal hemorrhoids and diverticulosis. I suspect your problems are from Irritable Bowel Syndrome and interstitial cystitis. My office will call with biopsy results and plans. Iva Boop, MD, FACG YOU HAD AN ENDOSCOPIC PROCEDURE TODAY AT THE [facility]: Refer to the procedure report that was given to you for any specific questions about what was found during the examination.  If the procedure report does not answer your questions, please call your gastroenterologist to clarify.  If you requested that your care partner not be given the details of your procedure findings, then the procedure report has been included in a sealed envelope for you to review at your convenience later.  YOU SHOULD EXPECT: Some feelings of bloating in the abdomen. Passage of more gas than usual.  Walking can help get rid of the air that was put into your GI tract during the procedure and reduce the bloating. If you had a lower endoscopy (such as a colonoscopy or flexible sigmoidoscopy) you may notice spotting of blood in your stool or on the toilet paper. If you underwent a bowel prep for your procedure, then you may not have a normal bowel movement for a few days.  DIET: Your first meal following the procedure should be a light meal and then it is ok to progress to your normal diet.  A half-sandwich or bowl of soup is an example of a good first meal.  Heavy or fried foods are harder to digest and may make you feel nauseous or bloated.  Likewise meals heavy in dairy and vegetables can cause extra gas to form and this can also increase the bloating.  Drink plenty of fluids but you should avoid alcoholic beverages for 24 hours.  ACTIVITY: Your care partner should take you home directly after the procedure.  You should plan to take it easy, moving slowly for the rest of the day.  You can resume normal activity the day after the procedure however you should NOT DRIVE or  use heavy machinery for 24 hours (because of the sedation medicines used during the test).    SYMPTOMS TO REPORT IMMEDIATELY: A gastroenterologist can be reached at any hour.  During normal business hours, 8:30 AM to 5:00 PM Monday through Friday, call [phone].  After hours and on weekends, please call the GI answering service at [phone] who will take a message and have the physician on call contact you.   Following lower endoscopy (colonoscopy or flexible sigmoidoscopy):  Excessive amounts of blood in the stool  Significant tenderness or worsening of abdominal pains  Swelling of the abdomen that is new, acute  Fever of 100F or higher  FOLLOW UP: If any biopsies were taken you will be contacted by phone or by letter within the next 1-3 weeks.  Call your gastroenterologist if you have not heard about the biopsies in 3 weeks.  Our staff will call the home number listed on your records the next business day following your procedure to check on you and address any questions or concerns that you may have at that time regarding the information given to you following your procedure. This is a courtesy call and so if there is no answer at the home number and we have not heard from you through the emergency physician on call, we will assume that you have returned to your regular daily activities without incident.  SIGNATURES/CONFIDENTIALITY: You and/or your care partner have signed paperwork  which will be entered into your electronic medical record.  These signatures attest to the fact that that the information above on your After Visit Summary has been reviewed and is understood.  Full responsibility of the confidentiality of this discharge information lies with you and/or your care-partner.

## 2012-01-24 NOTE — Op Note (Signed)
[facility] [address] [place], [place]  [address]  COLONOSCOPY PROCEDURE REPORT  PATIENT:  Erik, [name]  MR#:  [id number] BIRTHDATE:  1946-06-26, 65 yrs. old  GENDER:  male ENDOSCOPIST:  Iva Boop, MD, [facility]  PROCEDURE DATE:  01/24/2012 PROCEDURE:  Colonoscopy with biopsy ASA CLASS:  Class II INDICATIONS:  unexplained diarrhea, Abdominal pain MEDICATIONS:   These medications were titrated to patient response per physician's verbal order, propofol (Diprivan) 150 mg IV  DESCRIPTION OF PROCEDURE:   After the risks benefits and alternatives of the procedure were thoroughly explained, informed consent was obtained.  Digital rectal exam was performed and revealed no rectal masses and normal prostate.   The LB 180AL [id number] endoscope was introduced through the anus and advanced to the terminal ileum which was intubated for a short distance, without limitations.  The quality of the prep was excellent, using MoviPrep.  The instrument was then slowly withdrawn as the colon was fully examined. <<PROCEDUREIMAGES>>  FINDINGS:  Mild diverticulosis was found in the sigmoid colon. This was otherwise a normal examination of the colon. Into distal terminal ileum and with right colon retroflexion. Random biopsies were obtained and sent to pathology.   Retroflexed views in the rectum revealed internal hemorrhoids.    The time to cecum = 2:24 minutes. The scope was then withdrawn in 9:09 minutes from the cecum and the procedure completed. COMPLICATIONS:  None ENDOSCOPIC IMPRESSION: 1) Mild diverticulosis in the sigmoid colon 2) Internal hemorrhoids 3) Otherwise normal examination into distal terminal ileum - random colon biopsies taken RECOMMENDATIONS: 1) Await biopsy results 2) will call with results and plans - suspect IBS and interstitial cystitis are the cause of symptoms REPEAT EXAM:  In 10 year(s) for routine screening colonoscopy.  Iva Boop, MD, [facility]  CC:   Brent Bulla, MD and The Patient  n. eSIGNED:   Iva Boop at 01/24/2012 09:02 AM  Simmie Davies, [id number]

## 2012-01-24 NOTE — Progress Notes (Signed)
Patient did not have preoperative order for IV antibiotic SSI prophylaxis. (G8918)  Patient did not experience any of the following events: a burn prior to discharge; a fall within the facility; wrong site/side/patient/procedure/implant event; or a hospital transfer or hospital admission upon discharge from the facility. (G8907)  

## 2012-01-25 ENCOUNTER — Telehealth: Payer: Self-pay | Admitting: *Deleted

## 2012-01-25 NOTE — Telephone Encounter (Signed)
  Follow up Call-  Call back number 01/24/2012  Post procedure Call Back phone  # [phone]  Permission to leave phone message Yes     No answer at number given.  Left message on voicemail.

## 2012-01-29 ENCOUNTER — Ambulatory Visit (INDEPENDENT_AMBULATORY_CARE_PROVIDER_SITE_OTHER): Payer: No Typology Code available for payment source | Admitting: Professional

## 2012-01-29 DIAGNOSIS — F4323 Adjustment disorder with mixed anxiety and depressed mood: Secondary | ICD-10-CM

## 2012-01-30 ENCOUNTER — Other Ambulatory Visit: Payer: Self-pay

## 2012-01-30 MED ORDER — DICYCLOMINE HCL 10 MG PO CAPS: 10.0000 mg | ORAL_CAPSULE | Freq: Four times a day (QID) | ORAL | Status: DC | PRN

## 2012-01-30 NOTE — Progress Notes (Signed)
Quick Note:  Office  Let him know no inflammation/colitis found Dx is IBS and interstitial cystitis is part of problem  Change dicyclomine to 20 mg every 6 hours as needed for abdominal and rectal pain - #90 with 5 refills Follow-up me as needed He may need to see his urologist to follow-up on interstitial cystitis also  [facility] - no letter ______

## 2012-02-23 ENCOUNTER — Encounter: Payer: Self-pay | Admitting: Internal Medicine

## 2012-02-23 DIAGNOSIS — K219 Gastro-esophageal reflux disease without esophagitis: Secondary | ICD-10-CM | POA: Insufficient documentation

## 2012-02-23 DIAGNOSIS — M069 Rheumatoid arthritis, unspecified: Secondary | ICD-10-CM | POA: Insufficient documentation

## 2012-02-23 DIAGNOSIS — I2510 Atherosclerotic heart disease of native coronary artery without angina pectoris: Secondary | ICD-10-CM | POA: Insufficient documentation

## 2012-02-23 DIAGNOSIS — I10 Essential (primary) hypertension: Secondary | ICD-10-CM | POA: Insufficient documentation

## 2012-02-23 DIAGNOSIS — F329 Major depressive disorder, single episode, unspecified: Secondary | ICD-10-CM | POA: Insufficient documentation

## 2012-02-23 DIAGNOSIS — K2970 Gastritis, unspecified, without bleeding: Secondary | ICD-10-CM

## 2012-02-23 DIAGNOSIS — G4700 Insomnia, unspecified: Secondary | ICD-10-CM | POA: Insufficient documentation

## 2012-02-23 DIAGNOSIS — K589 Irritable bowel syndrome without diarrhea: Secondary | ICD-10-CM | POA: Insufficient documentation

## 2012-02-23 DIAGNOSIS — K648 Other hemorrhoids: Secondary | ICD-10-CM

## 2012-02-23 DIAGNOSIS — Z0000 Encounter for general adult medical examination without abnormal findings: Secondary | ICD-10-CM | POA: Insufficient documentation

## 2012-02-23 DIAGNOSIS — J309 Allergic rhinitis, unspecified: Secondary | ICD-10-CM | POA: Insufficient documentation

## 2012-02-23 DIAGNOSIS — E785 Hyperlipidemia, unspecified: Secondary | ICD-10-CM | POA: Insufficient documentation

## 2012-02-23 DIAGNOSIS — F32A Depression, unspecified: Secondary | ICD-10-CM | POA: Insufficient documentation

## 2012-02-23 DIAGNOSIS — K222 Esophageal obstruction: Secondary | ICD-10-CM

## 2012-02-23 DIAGNOSIS — K5792 Diverticulitis of intestine, part unspecified, without perforation or abscess without bleeding: Secondary | ICD-10-CM | POA: Insufficient documentation

## 2012-02-23 DIAGNOSIS — Z0001 Encounter for general adult medical examination with abnormal findings: Secondary | ICD-10-CM | POA: Insufficient documentation

## 2012-02-23 DIAGNOSIS — F419 Anxiety disorder, unspecified: Secondary | ICD-10-CM | POA: Insufficient documentation

## 2012-02-23 DIAGNOSIS — B9681 Helicobacter pylori [H. pylori] as the cause of diseases classified elsewhere: Secondary | ICD-10-CM | POA: Insufficient documentation

## 2012-02-23 DIAGNOSIS — K644 Residual hemorrhoidal skin tags: Secondary | ICD-10-CM | POA: Insufficient documentation

## 2012-02-27 ENCOUNTER — Other Ambulatory Visit: Payer: Self-pay | Admitting: Internal Medicine

## 2012-02-27 ENCOUNTER — Ambulatory Visit (INDEPENDENT_AMBULATORY_CARE_PROVIDER_SITE_OTHER): Payer: Medicare Other | Admitting: Internal Medicine

## 2012-02-27 ENCOUNTER — Other Ambulatory Visit (INDEPENDENT_AMBULATORY_CARE_PROVIDER_SITE_OTHER): Payer: No Typology Code available for payment source

## 2012-02-27 ENCOUNTER — Encounter: Payer: Self-pay | Admitting: Internal Medicine

## 2012-02-27 VITALS — BP 110/78 | HR 59 | Temp 97.4°F | Ht 67.0 in | Wt 166.0 lb

## 2012-02-27 DIAGNOSIS — Z0000 Encounter for general adult medical examination without abnormal findings: Secondary | ICD-10-CM

## 2012-02-27 DIAGNOSIS — Z79899 Other long term (current) drug therapy: Secondary | ICD-10-CM

## 2012-02-27 DIAGNOSIS — Z125 Encounter for screening for malignant neoplasm of prostate: Secondary | ICD-10-CM

## 2012-02-27 DIAGNOSIS — F450 Somatization disorder: Secondary | ICD-10-CM

## 2012-02-27 HISTORY — DX: Somatization disorder: F45.0

## 2012-02-27 LAB — HEPATIC FUNCTION PANEL
ALT: 19 U/L (ref 0–53)
AST: 18 U/L (ref 0–37)
Albumin: 4.4 g/dL (ref 3.5–5.2)
Alkaline Phosphatase: 69 U/L (ref 39–117)
Bilirubin, Direct: 0.1 mg/dL (ref 0.0–0.3)
Total Bilirubin: 0.7 mg/dL (ref 0.3–1.2)
Total Protein: 7.6 g/dL (ref 6.0–8.3)

## 2012-02-27 LAB — URINALYSIS, ROUTINE W REFLEX MICROSCOPIC
Bilirubin Urine: NEGATIVE
Hgb urine dipstick: NEGATIVE
Ketones, ur: NEGATIVE
Leukocytes, UA: NEGATIVE
Nitrite: NEGATIVE
Specific Gravity, Urine: 1.02 (ref 1.000–1.030)
Total Protein, Urine: NEGATIVE
Urine Glucose: NEGATIVE
Urobilinogen, UA: 0.2 (ref 0.0–1.0)
pH: 6 (ref 5.0–8.0)

## 2012-02-27 LAB — BASIC METABOLIC PANEL
BUN: 16 mg/dL (ref 6–23)
CO2: 29 mEq/L (ref 19–32)
Calcium: 9.6 mg/dL (ref 8.4–10.5)
Chloride: 102 mEq/L (ref 96–112)
Creatinine, Ser: 1 mg/dL (ref 0.4–1.5)
GFR: 76.19 mL/min (ref >60.00)
Glucose, Bld: 98 mg/dL (ref 70–99)
Potassium: 4.9 mEq/L (ref 3.5–5.1)
Sodium: 138 mEq/L (ref 135–145)

## 2012-02-27 LAB — LIPID PANEL
Cholesterol: 246 mg/dL — ABNORMAL HIGH (ref 0–200)
HDL: 44.7 mg/dL (ref >39.00)
Total CHOL/HDL Ratio: 6
Triglycerides: 119 mg/dL (ref 0.0–149.0)
VLDL: 23.8 mg/dL (ref 0.0–40.0)

## 2012-02-27 LAB — CBC WITH DIFFERENTIAL/PLATELET
Basophils Absolute: 0.1 10*3/uL (ref 0.0–0.1)
Basophils Relative: 0.6 % (ref 0.0–3.0)
Eosinophils Absolute: 0 10*3/uL (ref 0.0–0.7)
Eosinophils Relative: 0.3 % (ref 0.0–5.0)
HCT: 48.3 % (ref 39.0–52.0)
Hemoglobin: 16.5 g/dL (ref 13.0–17.0)
Lymphocytes Relative: 23.9 % (ref 12.0–46.0)
Lymphs Abs: 2.3 10*3/uL (ref 0.7–4.0)
MCHC: 34.2 g/dL (ref 30.0–36.0)
MCV: 91.1 fl (ref 78.0–100.0)
Monocytes Absolute: 0.9 10*3/uL (ref 0.1–1.0)
Monocytes Relative: 9.6 % (ref 3.0–12.0)
Neutro Abs: 6.2 10*3/uL (ref 1.4–7.7)
Neutrophils Relative %: 65.6 % (ref 43.0–77.0)
Platelets: 314 10*3/uL (ref 150.0–400.0)
RBC: 5.3 Mil/uL (ref 4.22–5.81)
RDW: 12.6 % (ref 11.5–14.6)
WBC: 9.5 10*3/uL (ref 4.5–10.5)

## 2012-02-27 LAB — LDL CHOLESTEROL, DIRECT: Direct LDL: 180.8 mg/dL

## 2012-02-27 LAB — TSH: TSH: 1.98 u[IU]/mL (ref 0.35–5.50)

## 2012-02-27 LAB — PSA: PSA: 1.98 ng/mL (ref 0.10–4.00)

## 2012-02-27 MED ORDER — EZETIMIBE 10 MG PO TABS: 10.0000 mg | ORAL_TABLET | Freq: Every day | ORAL | Status: DC

## 2012-02-27 NOTE — Progress Notes (Signed)
Subjective:    Patient ID: [name], male    DOB: Aug 31, 1946, 66 y.o.   MRN: [id number]  HPI  Here for wellness and f/u;  Overall doing ok;  Pt denies CP, worsening SOB, DOE, wheezing, orthopnea, PND, worsening LE edema, palpitations, dizziness or syncope.  Pt denies neurological change such as new Headache, facial or extremity weakness.  Pt denies polydipsia, polyuria, or low sugar symptoms. Pt states overall good compliance with treatment and medications, good tolerability, and trying to follow lower cholesterol diet.  Pt denies worsening depressive symptoms, suicidal ideation or panic. No fever, wt loss, night sweats, loss of appetite, or other constitutional symptoms.  Pt states good ability with ADL's, low fall risk, home safety reviewed and adequate, no significant changes in hearing or vision, and occasionally active with exercise.  Did see Dr Shelle Iron with left neck pain, referred to Dr Leslee Home who felt at that point it was rot cuff and bone spur, pain persisted to neck and now due for MRI soon which pt has put off, pt hoping a recent cortisone shot per rheum to left shoulder last thur; shoulder pain better, but neck pain persists. Pt hesitant for MRI due to stress and need for sedation.   Disabled since 1999 due to interstitial cysitis.   Past Medical History  Diagnosis Date  . Allergic rhinitis   . Rheumatoid arthritis   . Insomnia   . Anxiety   . CAD (coronary artery disease)   . Diverticulitis   . Fibromyalgia   . GERD with stricture   . HLD (hyperlipidemia)   . HTN (hypertension)   . Interstitial cystitis   . IBS (irritable bowel syndrome)   . Anal fissure   . Helicobacter pylori gastritis     treated  . Depression   . Hiatal hernia   . Internal and external hemorrhoids without complication   . FIBROMYALGIA 06/09/2010    Qualifier: Diagnosis of  By: Leone Payor MD, Charlyne Quale Irritable bowel syndrome 06/09/2010    Qualifier: Diagnosis of  By: Leone Payor MD, Charlyne Quale INTERSTITIAL CYSTITIS 06/09/2010    Qualifier: Diagnosis of  By: Leone Payor MD, Charlyne Quale Diverticulosis 12/18/2011  . 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 Nissen fundoplication 2009  . Tonsillectomy and adenoidectomy   . Vasectomy   . Knee arthroscopy     right x 2  . Transurethral resection of prostate     x 2  . Bladder distention     x 6  . Shoulder arthroscopy 2010    left  . Colonoscopy 05/10/2006    diverticulosis, internal and external hemorrhoids  . Upper gastrointestinal endoscopy 05/13/2008    hiatal hernia  . Appendectomy 1957    reports that he has quit smoking. He has never used smokeless tobacco. He reports that he does not drink alcohol or use illicit drugs. family history includes Breast cancer in his sister; Colon polyps in his father; Diabetes in his father and sister; and Heart disease in his father and mother. Allergies  Allergen Reactions  . Statins    Current Outpatient Prescriptions on File Prior to Visit  Medication Sig Dispense Refill  . amitriptyline (ELAVIL) 25 MG tablet Take 25 mg by mouth at bedtime.      . dicyclomine (BENTYL) 10 MG capsule Take 1 capsule (10 mg total) by mouth every 6 (six) hours as needed (for abdominal  burning and cramping).  90 capsule  5  . hydrOXYzine (ATARAX) 50 MG tablet Take 50 mg by mouth at bedtime.       Marland Kitchen LORazepam (ATIVAN) 0.5 MG tablet Take 0.5 mg by mouth at bedtime.        . Tamsulosin HCl (FLOMAX) 0.4 MG CAPS Take 0.4 mg by mouth at bedtime.        . Vitamin D, Ergocalciferol, (DRISDOL) 50000 UNITS CAPS Take 50,000 Units by mouth every 30 (thirty) days.        Marland Kitchen ezetimibe (ZETIA) 10 MG tablet Take 1 tablet (10 mg total) by mouth daily.  90 tablet  3   Review of Systems Review of Systems  Constitutional: Negative for diaphoresis, activity change, appetite change and unexpected weight change.  HENT: Negative for hearing loss, ear pain, facial swelling, mouth sores and  neck stiffness.   Eyes: Negative for pain, redness and visual disturbance.  Respiratory: Negative for shortness of breath and wheezing.   Cardiovascular: Negative for chest pain and palpitations.  Gastrointestinal: Negative for diarrhea, blood in stool, abdominal distention and rectal pain.  Genitourinary: Negative for hematuria, flank pain and decreased urine volume.  Musculoskeletal: Negative for myalgias and joint swelling.  Skin: Negative for color change and wound.  Neurological: Negative for syncope and numbness.  Hematological: Negative for adenopathy.  Psychiatric/Behavioral: Negative for hallucinations, self-injury, decreased concentration and agitation.      Objective:   Physical Exam BP 110/78  Pulse 59  Temp(Src) 97.4 F (36.3 C) (Oral)  Ht 5\' 7"  (1.702 m)  Wt 166 lb (75.297 kg)  BMI 26.00 kg/m2  SpO2 97% Physical Exam  VS noted Constitutional: Pt is oriented to person, place, and time. Appears well-developed and well-nourished.  HENT:  Head: Normocephalic and atraumatic.  Right Ear: External ear normal.  Left Ear: External ear normal.  Nose: Nose normal.  Mouth/Throat: Oropharynx is clear and moist.  Eyes: Conjunctivae and EOM are normal. Pupils are equal, round, and reactive to light.  Neck: Normal range of motion. Neck supple. No JVD present. No tracheal deviation present.  Cardiovascular: Normal rate, regular rhythm, normal heart sounds and intact distal pulses.   Pulmonary/Chest: Effort normal and breath sounds normal.  Abdominal: Soft. Bowel sounds are normal. There is no tenderness.  Musculoskeletal: Normal range of motion. Exhibits no edema.  Lymphadenopathy:  Has no cervical adenopathy.  Neurological: Pt is alert and oriented to person, place, and time. Pt has normal reflexes. No cranial nerve deficit.  Skin: Skin is warm and dry. No rash noted.  Psychiatric:  Has  normal mood and affect. Behavior is normal.     Assessment & Plan:

## 2012-02-27 NOTE — Patient Instructions (Addendum)
Please consider having the tetanus and shingles shot in the future if covered  By your insurance. Continue all other medications as before Please go to LAB in the Basement for the blood and/or urine tests to be done today You will be contacted by phone if any changes need to be made immediately.  Otherwise, you will receive a letter about your results with an explanation. Please return in 6 months, or sooner if needed

## 2012-03-02 ENCOUNTER — Encounter: Payer: Self-pay | Admitting: Internal Medicine

## 2012-03-02 NOTE — Assessment & Plan Note (Signed)

## 2012-03-07 ENCOUNTER — Telehealth: Payer: Self-pay

## 2012-03-07 NOTE — Telephone Encounter (Signed)
I think very unlikely the zetia is causing the problem  But ok to hold the zetia for 5 days, and please re-try as the zetia most likely not the cause

## 2012-03-07 NOTE — Telephone Encounter (Signed)
Pt called stating that Zetia is causing "severe pelvic floor burning". Pt is requesting MD advisement.

## 2012-03-07 NOTE — Telephone Encounter (Signed)
Called informed the patients wife of information. 

## 2012-04-25 NOTE — Telephone Encounter (Signed)
See previous message

## 2012-07-16 ENCOUNTER — Ambulatory Visit (INDEPENDENT_AMBULATORY_CARE_PROVIDER_SITE_OTHER): Payer: Medicare Other | Admitting: Surgery

## 2012-07-16 ENCOUNTER — Encounter (INDEPENDENT_AMBULATORY_CARE_PROVIDER_SITE_OTHER): Payer: Self-pay | Admitting: Surgery

## 2012-07-16 VITALS — BP 120/74 | HR 92 | Temp 97.0°F | Resp 20 | Ht 67.5 in | Wt 165.0 lb

## 2012-07-16 DIAGNOSIS — K219 Gastro-esophageal reflux disease without esophagitis: Secondary | ICD-10-CM

## 2012-07-16 DIAGNOSIS — K222 Esophageal obstruction: Secondary | ICD-10-CM

## 2012-07-16 NOTE — Patient Instructions (Signed)
Thanks for your patience.  If you need further assistance after leaving the office, please call our office and speak with a [facility] nurse.  [phone].  If you want to leave a message for Dr. Danila Eddie, please call his office phone at [phone]. 

## 2012-07-16 NOTE — Progress Notes (Signed)
Erik Cola Sr. 66 y.o.  Body mass index is 25.46 kg/(m^2).  Patient Active Problem List  Diagnosis  . Irritable bowel syndrome  . INTERSTITIAL CYSTITIS  . FIBROMYALGIA  . FATIGUE  . FLATULENCE-GAS-BLOATING  . Diarrhea  . ABDOMINAL PAIN-LLQ  . Diverticulosis  . Preventative health care  . Allergic rhinitis  . Rheumatoid arthritis  . Insomnia  . Anxiety  . CAD (coronary artery disease)  . Diverticulitis  . GERD with stricture  . HLD (hyperlipidemia)  . Helicobacter pylori gastritis  . Depression  . Internal and external hemorrhoids without complication  . Somatization disorder    Allergies  Allergen Reactions  . Statins     Past Surgical History  Procedure Date  . Nissen fundoplication 2009  . Tonsillectomy and adenoidectomy   . Vasectomy   . Knee arthroscopy     right x 2  . Transurethral resection of prostate     x 2  . Bladder distention     x 6  . Shoulder arthroscopy 2010    left  . Colonoscopy 05/10/2006    diverticulosis, internal and external hemorrhoids  . Upper gastrointestinal endoscopy 05/13/2008    hiatal hernia  . Appendectomy 1957   PERRY,LAWRENCE EDWARD, MD 1. GERD with stricture     4 year followup:  [name] returns and he has for years out from a LAP-BAND this and fundoplication done for significant reflux symptoms but without an ostensible hiatus hernia. The pain that he has usually occurs 12-24 hours after taking tramadol, hydrocodone, or benzodiazepines. He takes the for his rheumatoid arthritis, fibromyalgia.  These are different symptoms from when he was having before he had his Nissen fundoplication.  I want to go and get an upper GI series to look for evidence of a recurrent hernia or a stricture. I'll see him back after that study. Matt B. Daphine Deutscher, MD, [facility] [phone] beeper [phone]  07/16/2012 2:46 PM

## 2012-07-22 ENCOUNTER — Other Ambulatory Visit: Payer: Medicare Other

## 2012-07-30 ENCOUNTER — Encounter (INDEPENDENT_AMBULATORY_CARE_PROVIDER_SITE_OTHER): Payer: Medicare Other | Admitting: Surgery

## 2012-08-08 ENCOUNTER — Ambulatory Visit: Payer: Medicare Other | Admitting: Internal Medicine

## 2012-09-29 ENCOUNTER — Ambulatory Visit
Admission: RE | Admit: 2012-09-29 | Discharge: 2012-09-29 | Disposition: A | Discharge status: Home / Self Care | Payer: Medicare Other | Source: Ambulatory Visit | Attending: Surgery | Admitting: Surgery

## 2012-09-29 DIAGNOSIS — K219 Gastro-esophageal reflux disease without esophagitis: Secondary | ICD-10-CM

## 2012-10-02 ENCOUNTER — Telehealth (INDEPENDENT_AMBULATORY_CARE_PROVIDER_SITE_OTHER): Payer: Self-pay | Admitting: General Surgery

## 2012-10-02 NOTE — Telephone Encounter (Signed)
Pt called after procedure completed this week, ordered by Dr. Daphine Deutscher. He called to request a referral to Dr. Leone Payor because of results. Please contact pt./gy

## 2012-10-02 NOTE — Telephone Encounter (Signed)
Called pt to let him know I got him an appt w/ Dr. Leone Payor on 12/30 at 1:45.

## 2012-10-29 ENCOUNTER — Ambulatory Visit: Payer: Medicare Other | Admitting: Internal Medicine

## 2012-11-10 ENCOUNTER — Ambulatory Visit (INDEPENDENT_AMBULATORY_CARE_PROVIDER_SITE_OTHER)
Admission: RE | Admit: 2012-11-10 | Discharge: 2012-11-10 | Disposition: A | Discharge status: Home / Self Care | Payer: Medicare Other | Source: Ambulatory Visit | Attending: Internal Medicine | Admitting: Internal Medicine

## 2012-11-10 ENCOUNTER — Ambulatory Visit (INDEPENDENT_AMBULATORY_CARE_PROVIDER_SITE_OTHER): Payer: Medicare Other | Admitting: Internal Medicine

## 2012-11-10 ENCOUNTER — Encounter: Payer: Self-pay | Admitting: Internal Medicine

## 2012-11-10 VITALS — BP 120/70 | HR 74 | Ht 68.0 in | Wt 171.6 lb

## 2012-11-10 DIAGNOSIS — R1013 Epigastric pain: Secondary | ICD-10-CM

## 2012-11-10 DIAGNOSIS — R197 Diarrhea, unspecified: Secondary | ICD-10-CM

## 2012-11-10 DIAGNOSIS — F458 Other somatoform disorders: Secondary | ICD-10-CM

## 2012-11-10 DIAGNOSIS — R140 Abdominal distension (gaseous): Secondary | ICD-10-CM

## 2012-11-10 DIAGNOSIS — R0989 Other specified symptoms and signs involving the circulatory and respiratory systems: Secondary | ICD-10-CM

## 2012-11-10 DIAGNOSIS — R198 Other specified symptoms and signs involving the digestive system and abdomen: Secondary | ICD-10-CM

## 2012-11-10 DIAGNOSIS — R1310 Dysphagia, unspecified: Secondary | ICD-10-CM

## 2012-11-10 DIAGNOSIS — R141 Gas pain: Secondary | ICD-10-CM

## 2012-11-10 DIAGNOSIS — R143 Flatulence: Secondary | ICD-10-CM

## 2012-11-10 NOTE — Patient Instructions (Addendum)
You have been scheduled for an endoscopy with propofol. Please follow written instructions given to you at your visit today. If you use inhalers (even only as needed) or a CPAP machine, please bring them with you on the day of your procedure.  Your physician has requested that you go to the basement for an x-ray today.  We are going to get your records from Dr. Sharyn Lull and Dr. Jenne Pane.  Thank you for choosing me and [facility].  Iva Boop, M.D., [facility]

## 2012-11-10 NOTE — Progress Notes (Addendum)
Subjective:    Patient ID: Erik Cola Sr., male    DOB: Jun 05, 1946, 66 y.o.   MRN: [id number]  HPI "[name]" is pronunciation Patient presents in followup. I last saw him earlier this year, in the spring with complaints of diarrhea and bloating. He was empirically treated with metronidazole and also dicyclomine. He had a colonoscopy with random biopsies that did not show any signs of colitis. Since that time he has continued to have problems with bloating, gas and loose stools and diarrhea intermittently. He does think he benefited from the therapy prescribed, and notes that dicyclomine did help his crampy abdominal pain but more recently he developed insomnia the attributes to the medication so she stopped it. He says he is unable to take many if any medications long-term because they "always back fire on me".  More recently over the past several months he is described chronic throat clearing that disturbs his wife, sensation of something in his throat like phlegm, and swallowing difficulty with suprasternal sticking with solids and liquids. He did have dysphagia in the immediate postoperative period after his Nissen fundoplication in 2009 but that resolved. He is not sure if this is similar to that. He will wait and the sensation of food being stuck we'll leave, as it passes but he has a chronic sensation of something in his throat. If he coughs that sensation will disappear but it always returned. Additional problems include the loose stools 4-5 times a day, borborygmi, very bad flatulence, fatigue and bloating. He also has an intermittent epigastric discomfort and pain that is relieved, 90% of the time for the patient, by drinking [place]. He does not belch after he does this in fact he is unable to belch or vomit status post Nissen fundoplication.  He has sought help from Dr. Jenne Pane of otolaryngology, and then subsequently saw Dr. Lucie Leather allergy. He is told he most likely had reflux. He was  placed on Nexium but that did not help. It sounds like he was given a trial of Singulair or montelukast he is not on that now. He said that Nexium causes joint pain and diarrhea. Similar to other medications he was unable to tolerate this because of side effects. Allergies  Allergen Reactions  . Statins    Outpatient Prescriptions Prior to Visit  Medication Sig Dispense Refill  . dicyclomine (BENTYL) 10 MG capsule Take 1 capsule (10 mg total) by mouth every 6 (six) hours as needed (for abdominal burning and cramping).  90 capsule  5  . LORazepam (ATIVAN) 0.5 MG tablet Take 0.5 mg by mouth as needed.       . [DISCONTINUED] amitriptyline (ELAVIL) 25 MG tablet Take 25 mg by mouth at bedtime.      . [DISCONTINUED] ezetimibe (ZETIA) 10 MG tablet Take 1 tablet (10 mg total) by mouth daily.  90 tablet  3  . [DISCONTINUED] hydrOXYzine (ATARAX) 50 MG tablet Take 50 mg by mouth at bedtime.       . [DISCONTINUED] Tamsulosin HCl (FLOMAX) 0.4 MG CAPS Take 0.4 mg by mouth at bedtime.        . [DISCONTINUED] Vitamin D, Ergocalciferol, (DRISDOL) 50000 UNITS CAPS Take 50,000 Units by mouth every 30 (thirty) days.         Last reviewed on 11/10/2012  2:42 PM by Iva Boop, MD Past Medical History  Diagnosis Date  . Allergic rhinitis   . Rheumatoid arthritis   . Insomnia   . Anxiety   .  CAD (coronary artery disease)   . Diverticulitis   . Fibromyalgia   . GERD with stricture   . HLD (hyperlipidemia)   . HTN (hypertension)   . Interstitial cystitis   . IBS (irritable bowel syndrome)   . Anal fissure   . Helicobacter pylori gastritis     treated  . Depression   . Hiatal hernia   . Internal and external hemorrhoids without complication   . FIBROMYALGIA 06/09/2010    Qualifier: Diagnosis of  By: Leone Payor MD, Charlyne Quale Irritable bowel syndrome 06/09/2010    Qualifier: Diagnosis of  By: Leone Payor MD, Charlyne Quale INTERSTITIAL CYSTITIS 06/09/2010    Qualifier: Diagnosis of  By: Leone Payor MD,  Charlyne Quale Diverticulosis 12/18/2011  . 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 Nissen fundoplication 2009  . Tonsillectomy and adenoidectomy   . Vasectomy   . Knee arthroscopy     right x 2  . Transurethral resection of prostate     x 2  . Bladder distention     x 6  . Shoulder arthroscopy 2010    left  . Colonoscopy 05/10/2006    diverticulosis, internal and external hemorrhoids  . Upper gastrointestinal endoscopy 05/13/2008    hiatal hernia  . Appendectomy 1957   Review of Systems As per history of present illness.    Objective:   Physical Exam General:  NAD Eyes:   Anicteric Neck:  Without deformity tenderness or mass Lungs:  clear Heart:  S1S2 no rubs, murmurs or gallops Abdomen:  soft and nontender, BS+, there is some distention in the upper abdomen with increased tympany.     Data Reviewed:  I requested the otolaryngology and allergy notes and will review them when they arrive. He had an upper GI series on 07/30/2012 after he saw Dr. Daphine Deutscher of surgery. I reviewed the report and the images. He has slightly prominent gastric rugae day and some irregularity of the duodenal mucosa suggesting duodenitis. There was no reflux, no obstruction to passage of barium and no problems with the fundoplication, it appeared intact.        Assessment & Plan:   1. Globus sensation  Ambulatory referral to Gastroenterology  2. Dysphagia  Ambulatory referral to Gastroenterology  3. Epigastric pain  Ambulatory referral to Gastroenterology, DG Abd 2 Views  4. Bloating  DG Abd 2 Views  5. Diarrhea     Patient has a multitude of symptoms without objective correlation at this point. He has a diagnosis of some apposition disorder on his past medical history, and that certainly may be part of his problem. He has underlying anxiety interstitial cystitis which may be having some overlap. However he also has a history of a Nissen fundoplication I think  he is having at least some gas bloat problems after that. The globus sensation and dysphagia problems are of unclear etiology but could be a motility disturbance, gastroesophageal junction stenosis or stricture, or perhaps related to underlying anxiety and some agitation and functional disturbance. I've explained this to him. Although I have not yet seen the reports from otolaryngology and allergy, I suspect it's much less likely to be a GERD problem or an allergy problem. 1. Further objective information is appropriate given the irregularity seen on the upper GI series and I think will be needed to satisfy this patient's upper GI endoscopy with possible esophageal or upper esophageal sphincter dilation at  least is recommended to the patient. The risks and benefits as well as alternatives of endoscopic procedure(s) have been discussed and reviewed. All questions answered. The patient agrees to proceed. He has this prominent tympanitic area in the upper abdomen, I'm going to perform an x-ray of the area to see if that provides any additional information. He certainly could have some sort of postoperative problem either neuropathic or mechanical though that seems less likely given the upper GI results. He may need a another trial of an antibiotic for gas bloat issues. I have a difficult time understanding whether he received benefit from that in the past based upon my interview with him today, it was not clear to me. Further plans pending results and clinical course. Another objective analysis could involve esophageal manometry.  CC: Luretha Murphy M.D., Noralee Space M.D., Laurette Schimke M.D.   Abdominal xray report says unremarkable bowel gas pattern and calcified cyst wall vs. Milk of calcium in cyst right kidney - however there is gas in the stomach right under the diaphragm that corresponds to the physical findings of mild distention and tympany in upper abdomen.

## 2012-11-14 ENCOUNTER — Telehealth: Payer: Self-pay | Admitting: Internal Medicine

## 2012-11-14 NOTE — Telephone Encounter (Signed)
I have left a message with the results and asked that he call for any questions

## 2012-11-18 ENCOUNTER — Encounter: Payer: Self-pay | Admitting: Internal Medicine

## 2012-11-18 ENCOUNTER — Ambulatory Visit (AMBULATORY_SURGERY_CENTER): Payer: Medicare Other | Admitting: Internal Medicine

## 2012-11-18 VITALS — BP 96/56 | HR 71 | Temp 96.4°F | Resp 14 | Ht 68.0 in | Wt 171.0 lb

## 2012-11-18 DIAGNOSIS — R1013 Epigastric pain: Secondary | ICD-10-CM

## 2012-11-18 DIAGNOSIS — R1310 Dysphagia, unspecified: Secondary | ICD-10-CM

## 2012-11-18 DIAGNOSIS — R198 Other specified symptoms and signs involving the digestive system and abdomen: Secondary | ICD-10-CM

## 2012-11-18 DIAGNOSIS — A048 Other specified bacterial intestinal infections: Secondary | ICD-10-CM

## 2012-11-18 DIAGNOSIS — K2990 Gastroduodenitis, unspecified, without bleeding: Secondary | ICD-10-CM

## 2012-11-18 DIAGNOSIS — D1330 Benign neoplasm of unspecified part of small intestine: Secondary | ICD-10-CM

## 2012-11-18 DIAGNOSIS — K2970 Gastritis, unspecified, without bleeding: Secondary | ICD-10-CM

## 2012-11-18 DIAGNOSIS — R0989 Other specified symptoms and signs involving the circulatory and respiratory systems: Secondary | ICD-10-CM

## 2012-11-18 DIAGNOSIS — K2980 Duodenitis without bleeding: Secondary | ICD-10-CM

## 2012-11-18 MED ORDER — SODIUM CHLORIDE 0.9 % IV SOLN: 500.0000 mL | INTRAVENOUS | Status: DC

## 2012-11-18 NOTE — Op Note (Signed)
[facility] [address] [place], [address]   ENDOSCOPY PROCEDURE REPORT  PATIENT: Erik, [name]  MR#: [id number] BIRTHDATE: [birth year] , 66  yrs. old GENDER: Male ENDOSCOPIST: Iva Boop, MD, [facility] PROCEDURE DATE:  11/18/2012 PROCEDURE:  EGD w/ biopsy and Maloney dilation of esophagus ASA CLASS:     Class III INDICATIONS:  Dysphagia.   Epigastric pain. MEDICATIONS: propofol (Diprivan) 300mg  IV, MAC sedation, administered by CRNA, and These medications were titrated to patient response per physician's verbal order TOPICAL ANESTHETIC: none  DESCRIPTION OF PROCEDURE: After the risks benefits and alternatives of the procedure were thoroughly explained, informed consent was obtained.  The LB-GIF Q180 [id number] endoscope was introduced through the mouth and advanced to the second portion of the duodenum. Without limitations.  The instrument was slowly withdrawn as the mucosa was fully examined.        ESOPHAGUS: The mucosa of the esophagus appeared normal.  STOMACH: Moderate gastritis (inflammation) was found in the entire examined stomach.  Multiple biopsies were performed using cold forceps.  Sample sent for histology.   A Nissen fundoplication was found in the cardia.  The site was intact.  DUODENUM: Mild duodenal inflammation was found in the 2nd part of the duodenum.  Retroflexed views revealed as above..     The scope was then withdrawn from the patient, a 21 [place] Maloney dilator passed with moderate resistance but no heme, the scope was reinserted and inspection showed no trauma. and the procedure completed.  COMPLICATIONS: There were no complications. ENDOSCOPIC IMPRESSION: 1.   The mucosa of the esophagus appeared normal - dilated 68 [place] because of globus and dysphagia 2.   Gastritis (inflammation) was found in the entire examined stomach; multiple biopsies 3.   Nissen fundoplication was found in the cardia - intact 4.    Duodenal inflammation was found in the 2nd part of the duodenum - biopsied     RECOMMENDATIONS: Office will call with results. start gas and flatulence prevention diet.   eSigned:  Iva Boop, MD, [facility] 11/18/2012 10:16 AM   XB:[id number] Marina Goodell, MD, Luretha Murphy, MD, The Patient, Laurette Schimke, MD, and Christia Reading, MD  PATIENT NAME:  Erik, [name]. MR#: [id number]

## 2012-11-18 NOTE — Progress Notes (Signed)
Called to room to assist during endoscopic procedure.  Patient ID and intended procedure confirmed with present staff. Received instructions for my participation in the procedure from the performing physician. ewm 

## 2012-11-18 NOTE — Patient Instructions (Addendum)
The endoscopy showed inflammation in the stomach and duodenum (small intestine). I took biopsies to see what could be causing this. My office will call with these results and recommendations.  I dilated the esophagus to see if that will help your swallowing also.  I am providing a gas prevention diet for you to follow - please read and try to modify your diet.  Thank you for choosing me and [facility].  Iva Boop, MD, FACG  YOU HAD AN ENDOSCOPIC PROCEDURE TODAY AT THE [facility]: Refer to the procedure report that was given to you for any specific questions about what was found during the examination.  If the procedure report does not answer your questions, please call your gastroenterologist to clarify.  If you requested that your care partner not be given the details of your procedure findings, then the procedure report has been included in a sealed envelope for you to review at your convenience later.  YOU SHOULD EXPECT: Some feelings of bloating in the abdomen. Passage of more gas than usual.  Walking can help get rid of the air that was put into your GI tract during the procedure and reduce the bloating. If you had a lower endoscopy (such as a colonoscopy or flexible sigmoidoscopy) you may notice spotting of blood in your stool or on the toilet paper. If you underwent a bowel prep for your procedure, then you may not have a normal bowel movement for a few days.  DIET: Clear liquids for the first hour then soft foods for the rest of today.  ACTIVITY: Your care partner should take you home directly after the procedure.  You should plan to take it easy, moving slowly for the rest of the day.  You can resume normal activity the day after the procedure however you should NOT DRIVE or use heavy machinery for 24 hours (because of the sedation medicines used during the test).    SYMPTOMS TO REPORT IMMEDIATELY: A gastroenterologist can be reached at any hour.  During  normal business hours, 8:30 AM to 5:00 PM Monday through Friday, call [phone].  After hours and on weekends, please call the GI answering service at [phone] who will take a message and have the physician on call contact you.   Following lower endoscopy (colonoscopy or flexible sigmoidoscopy):  Excessive amounts of blood in the stool  Significant tenderness or worsening of abdominal pains  Swelling of the abdomen that is new, acute  Fever of 100F or higher  Following upper endoscopy (EGD)  Vomiting of blood or coffee ground material  New chest pain or pain under the shoulder blades  Painful or persistently difficult swallowing  New shortness of breath  Fever of 100F or higher  Black, tarry-looking stools  FOLLOW UP: If any biopsies were taken you will be contacted by phone or by letter within the next 1-3 weeks.  Call your gastroenterologist if you have not heard about the biopsies in 3 weeks.  Our staff will call the home number listed on your records the next business day following your procedure to check on you and address any questions or concerns that you may have at that time regarding the information given to you following your procedure. This is a courtesy call and so if there is no answer at the home number and we have not heard from you through the emergency physician on call, we will assume that you have returned to your regular daily activities without incident.  SIGNATURES/CONFIDENTIALITY: You and/or your care partner have signed paperwork which will be entered into your electronic medical record.  These signatures attest to the fact that that the information above on your After Visit Summary has been reviewed and is understood.  Full responsibility of the confidentiality of this discharge information lies with you and/or your care-partner.

## 2012-11-18 NOTE — Progress Notes (Signed)
Patient did not experience any of the following events: a burn prior to discharge; a fall within the facility; wrong site/side/patient/procedure/implant event; or a hospital transfer or hospital admission upon discharge from the facility. (G8907) Patient did not have preoperative order for IV antibiotic SSI prophylaxis. (G8918)  

## 2012-11-19 ENCOUNTER — Telehealth: Payer: Self-pay

## 2012-11-19 NOTE — Telephone Encounter (Signed)
  Follow up Call-  Call back number 11/18/2012 01/24/2012  Post procedure Call Back phone  # [phone]  Permission to leave phone message Yes Yes     Patient questions:  Do you have a fever, pain , or abdominal swelling? no Pain Score  0 *  Have you tolerated food without any problems? yes  Have you been able to return to your normal activities? yes  Do you have any questions about your discharge instructions: Diet   no Medications  no Follow up visit  no  Do you have questions or concerns about your Care? no  Actions: * If pain score is 4 or above: No action needed, pain <4.

## 2012-11-25 ENCOUNTER — Encounter: Payer: Self-pay | Admitting: Internal Medicine

## 2012-11-25 DIAGNOSIS — A048 Other specified bacterial intestinal infections: Secondary | ICD-10-CM

## 2012-11-25 HISTORY — DX: Other specified bacterial intestinal infections: A04.8

## 2012-11-25 NOTE — Progress Notes (Signed)
Quick Note:  Office  He has H. Pylori present in gastric biopsies  1) Pylera x 10 days 2) omeprazole 20 mg bid x 10 d (Prilosec OTC is ok) 3) See me in 6 weeks 4) If Pylera too expensive do Amoxicillin 1000 mg bid and clarithromycin 500 mg bid  LE[facility]  No recall/letter ______

## 2012-12-02 ENCOUNTER — Telehealth: Payer: Self-pay | Admitting: Internal Medicine

## 2012-12-02 MED ORDER — OMEPRAZOLE 20 MG PO CPDR: 20.0000 mg | DELAYED_RELEASE_CAPSULE | Freq: Two times a day (BID) | ORAL | Status: DC

## 2012-12-02 MED ORDER — BIS SUBCIT-METRONID-TETRACYC 140-125-125 MG PO CAPS: 3.0000 | ORAL_CAPSULE | Freq: Four times a day (QID) | ORAL | Status: DC

## 2012-12-02 NOTE — Telephone Encounter (Signed)
Patient advised of the results.  Dr. Leone Payor asked that I pass on to the patient that the delay was due to a computer problem (he forgot to route it ).  Patient will call back if the price of Pylera is too expensive.  REV scheduled 12/30/12 3:45

## 2012-12-11 ENCOUNTER — Telehealth: Payer: Self-pay | Admitting: Internal Medicine

## 2012-12-11 ENCOUNTER — Encounter (INDEPENDENT_AMBULATORY_CARE_PROVIDER_SITE_OTHER): Payer: Self-pay

## 2012-12-11 NOTE — Telephone Encounter (Signed)
Left message to call back  

## 2012-12-11 NOTE — Telephone Encounter (Signed)
Patient is reporting joint pain (knees mainly) and nightmares.  He wonders if his Prilosec rx is causing this, please advise.  Thank you Sir.

## 2012-12-12 NOTE — Telephone Encounter (Signed)
Per Dr. Leone Payor patient may stop his prilosec but needs to finish his antibotic for his H. Pylori , only has 1.5 days left.  He will report back to [name] next week regarding the joint pain.

## 2012-12-27 ENCOUNTER — Other Ambulatory Visit: Payer: Self-pay

## 2012-12-30 ENCOUNTER — Ambulatory Visit (INDEPENDENT_AMBULATORY_CARE_PROVIDER_SITE_OTHER): Payer: Medicare Other | Admitting: Internal Medicine

## 2012-12-30 ENCOUNTER — Encounter: Payer: Self-pay | Admitting: Internal Medicine

## 2012-12-30 VITALS — BP 100/70 | HR 88 | Ht 66.25 in | Wt 172.2 lb

## 2012-12-30 DIAGNOSIS — A048 Other specified bacterial intestinal infections: Secondary | ICD-10-CM

## 2012-12-30 DIAGNOSIS — B9681 Helicobacter pylori [H. pylori] as the cause of diseases classified elsewhere: Secondary | ICD-10-CM

## 2012-12-30 NOTE — Patient Instructions (Addendum)
Glad your doing well, you may use the dicyclomine for cramps and antiacids as needed.  Follow up with [name] as needed.  Thank you for choosing me and [facility].  Iva Boop, M.D., [facility]

## 2012-12-30 NOTE — Progress Notes (Signed)
  Subjective:    Patient ID: Erik Cola Sr., male    DOB: Jun 02, 1946, 67 y.o.   MRN: [id number]  HPI The patient returns after he was found to have H. pylori gastritis and upper GI endoscopy. He took a course of pylera and Prilosec. He completed that. Joint pains he thought were coming from the Prilosec. He feels significantly better with only occasional epigastric pain. Last night he had that he took some TUMS with relief.  Medications, allergies, past medical history, past surgical history, family history and social history are reviewed and updated in the EMR.   Review of Systems As above    Objective:   Physical Exam He is in no acute distress       Assessment & Plan:   1. Helicobacter pylori gastritis  treated and improved

## 2012-12-30 NOTE — Assessment & Plan Note (Signed)
Treated  Improved Follow-up as needed Use dicyclomine or antacids as needed

## 2013-02-18 ENCOUNTER — Encounter: Payer: Self-pay | Admitting: *Deleted

## 2013-02-18 ENCOUNTER — Ambulatory Visit (INDEPENDENT_AMBULATORY_CARE_PROVIDER_SITE_OTHER): Payer: Medicare Other | Admitting: Cardiology

## 2013-02-18 ENCOUNTER — Other Ambulatory Visit: Payer: Self-pay | Admitting: *Deleted

## 2013-02-18 VITALS — BP 125/79 | HR 82 | Ht 66.25 in | Wt 167.0 lb

## 2013-02-18 DIAGNOSIS — R079 Chest pain, unspecified: Secondary | ICD-10-CM

## 2013-02-18 DIAGNOSIS — I2510 Atherosclerotic heart disease of native coronary artery without angina pectoris: Secondary | ICD-10-CM

## 2013-02-18 DIAGNOSIS — E785 Hyperlipidemia, unspecified: Secondary | ICD-10-CM

## 2013-02-18 NOTE — Patient Instructions (Addendum)
Start aspirin 81mg  daily.  Your physician has requested that you have en exercise stress myoview. For further information please visit [email or web address] Please follow instruction sheet, as given.  Your physician recommends that you schedule a follow-up appointment in: about 3 weeks with Dr Shirlee Latch.

## 2013-02-18 NOTE — Progress Notes (Signed)
Patient ID: Erik Cola Sr., male   DOB: 05/01/1946, 67 y.o.   MRN: [id number] PCP: Dr. Marina Goodell  67 yo with history of nonobstructive CAD, fibromyalgia, and interstitial cystitis presents for evaluation of chest pain.  He has had chest pain in the past and had a catheterization in 2007 with a 40-50% LAD stenosis that was thought to be nonobstructive.  He is not taking any cardiac medications as he says that he is "very sensitive" to medications.  His LDL was quite high when last checked in 4/13 (181).  Patient's main complaint is lower chest/epigastric area pain.  He feels this at night when lying in bed.  He can make it better by drinking carbonated drinks.  He has actually had this sensation off and on for a number of years.  It seem to be worse over the last few months.  He thinks that it feels different than reflux he has had in the past.  He had a GI workup recently that did not definitively identify the cause of the pain.  He denies exertional dyspnea.  He is on disability because of interstitial cystitis.  He has somewhat diffuse joint pain.    ECG: NSR, right axis deviation, inferior Qs  Labs (4/13): TSH normal, K 4.9, creatinine 1.0, HDL 45, LDL 181  PMH: 1. H. Pylori gastritis 2. GERD with h/o esophageal stricture.  Has had Nissen fundoplication.  3. IBS 4. Interstitial cystitis 5. Fibromyalgia 6. Depression 7. Rheumatoid arthritis 8. CAD: LHC in 2007 with 40-50% proximal LAD stenosis.   SH: Lives in [place].  Nonsmoker since 1980.  Has been on disability because of interstitial cystitis.    FH: Mother with CVA, father with "heart trouble."    ROS: All systems reviewed and negative except as per HPI.   Current Outpatient Prescriptions  Medication Sig Dispense Refill  . amitriptyline (ELAVIL) 25 MG tablet Take 25 mg by mouth at bedtime.      . clonazePAM (KLONOPIN) 0.5 MG tablet Take 0.5 mg by mouth as needed.       . dicyclomine (BENTYL) 10 MG capsule Take 1 capsule (10 mg  total) by mouth every 6 (six) hours as needed (for abdominal burning and cramping).  90 capsule  5  . LORazepam (ATIVAN) 0.5 MG tablet Take 0.5 mg by mouth daily as needed for anxiety.      . traMADol (ULTRAM) 50 MG tablet Take 25 mg by mouth as needed.       Marland Kitchen aspirin EC 81 MG tablet Take 1 tablet (81 mg total) by mouth daily.       No current facility-administered medications for this visit.    BP 125/79  Pulse 82  Ht 5' 6.25" (1.683 m)  Wt 167 lb (75.751 kg)  BMI 26.74 kg/m2 General: NAD Neck: No JVD, no thyromegaly or thyroid nodule.  Lungs: Clear to auscultation bilaterally with normal respiratory effort. CV: Nondisplaced PMI.  Heart regular S1/S2, no S3/S4, no murmur.  No peripheral edema.  No carotid bruit.  Normal pedal pulses.  Abdomen: Soft, nontender, no hepatosplenomegaly, no distention.  Skin: Intact without lesions or rashes.  Neurologic: Alert and oriented x 3.  Psych: Normal affect. Extremities: No clubbing or cyanosis.  HEENT: Normal.   Assessment/Plan: 1. Chest pain: The chest pain is atypical.  It occurs at night when lying in bed and is not exertional.  Sometimes carbonated drinks relieve it.  To me, it sounds more GI-related.  He thinks that it  is different than prior GERD symptoms he has had.  He does have a history of nonobstructive CAD and had Qs of questionable significance on his ECG.  - He will take ASA 81 mg daily - I willl arrange for a Lexiscan Sestamibi (thinks joint pain will prevent walking on treadmill).  If this is normal, would pursue further GI workup.  2. Hyperlipidemia: His LDL was quite high in 4/13.  He will not, however, consider going on a statin due to concern for worsening his pain.   Marca Ancona 02/18/2013

## 2013-02-24 ENCOUNTER — Encounter (HOSPITAL_COMMUNITY): Payer: Medicare Other

## 2013-03-12 ENCOUNTER — Ambulatory Visit (HOSPITAL_COMMUNITY): Payer: Medicare Other | Attending: Cardiology | Admitting: Radiology

## 2013-03-12 VITALS — BP 131/89 | Ht 67.5 in | Wt 167.0 lb

## 2013-03-12 DIAGNOSIS — R1013 Epigastric pain: Secondary | ICD-10-CM | POA: Insufficient documentation

## 2013-03-12 DIAGNOSIS — R5383 Other fatigue: Secondary | ICD-10-CM | POA: Insufficient documentation

## 2013-03-12 DIAGNOSIS — R0602 Shortness of breath: Secondary | ICD-10-CM | POA: Insufficient documentation

## 2013-03-12 DIAGNOSIS — R5381 Other malaise: Secondary | ICD-10-CM | POA: Insufficient documentation

## 2013-03-12 DIAGNOSIS — R079 Chest pain, unspecified: Secondary | ICD-10-CM | POA: Insufficient documentation

## 2013-03-12 DIAGNOSIS — R42 Dizziness and giddiness: Secondary | ICD-10-CM | POA: Insufficient documentation

## 2013-03-12 DIAGNOSIS — I2510 Atherosclerotic heart disease of native coronary artery without angina pectoris: Secondary | ICD-10-CM

## 2013-03-12 DIAGNOSIS — R002 Palpitations: Secondary | ICD-10-CM | POA: Insufficient documentation

## 2013-03-12 MED ORDER — REGADENOSON 0.4 MG/5ML IV SOLN
0.4000 mg | Freq: Once | INTRAVENOUS | Status: AC
  Administered 2013-03-12: 0.4 mg via INTRAVENOUS

## 2013-03-12 MED ORDER — TECHNETIUM TC 99M SESTAMIBI GENERIC - CARDIOLITE
11.0000 | Freq: Once | INTRAVENOUS | Status: AC | PRN
  Administered 2013-03-12: 11 via INTRAVENOUS

## 2013-03-12 MED ORDER — TECHNETIUM TC 99M SESTAMIBI GENERIC - CARDIOLITE
33.0000 | Freq: Once | INTRAVENOUS | Status: AC | PRN
  Administered 2013-03-12: 33 via INTRAVENOUS

## 2013-03-12 NOTE — Progress Notes (Addendum)
[facility] SITE 3 NUCLEAR MED [address] [place], [place] [address] [phone]    Cardiology Nuclear Med Study  [name] Sr. is a 67 y.o. male     MRN : [id number]     DOB: October 11, 1946  Procedure Date: 03/12/2013  Nuclear Med Background Indication for Stress Test:  Evaluation for Ischemia History: 10 yrs. Ago Myocardial Perfusion Study- Ok per patient, no report available, '07 Heart Catheterization-Nonobstructive disease (LAD) Cardiac Risk Factors: Family History - CAD, History of Smoking and Lipids  Symptoms: Chest Pain and Epigastric Pain without exertion (last occurrence 2 weeks ago),  Diaphoresis, Dizziness, DOE, Fatigue, Fatigue with Exertion, Palpitations and Rapid HR   Nuclear Pre-Procedure Caffeine/Decaff Intake:  None > 12hrs NPO After: 8:00pm   Lungs:  clear O2 Sat: 96% on room air. IV 0.9% NS with Angio Cath:  20g  IV Site: R Antecubital x 1, tolerated well IV Started by:  Irean Hong, RN  Chest Size (in):  42 Cup Size: n/a  Height: 5' 7.5" (1.715 m)  Weight:  167 lb (75.751 kg)  BMI:  Body mass index is 25.75 kg/(m^2). Tech Comments:  No medications today    Nuclear Med Study 1 or 2 day study: 1 day  Stress Test Type:  Treadmill/Lexiscan  Reading MD: Cassell Clement, MD  Order Authorizing Provider:  Marca Ancona, MD  Resting Radionuclide: Technetium 67m Sestamibi  Resting Radionuclide Dose: 11.0 mCi   Stress Radionuclide:  Technetium 16m Sestamibi  Stress Radionuclide Dose: 33.0 mCi           Stress Protocol Rest HR: 58 Stress HR: 125  Rest BP: 131/89 Stress BP: 188/88  Exercise Time (min): 2:00 METS: n/a   Predicted Max HR: 154 bpm % Max HR: 81.17 bpm Rate Pressure Product: 21308   Dose of Adenosine (mg):  n/a Dose of Lexiscan: 0.4 mg  Dose of Atropine (mg): n/a Dose of Dobutamine: n/a mcg/kg/min (at max HR)  Stress Test Technologist: Irean Hong, RN  Nuclear Technologist:  Domenic Polite, CNMT     Rest Procedure:   Myocardial perfusion imaging was performed at rest 45 minutes following the intravenous administration of Technetium 77m Sestamibi. Rest ECG: NSR - Normal EKG  Stress Procedure:  The patient received IV Lexiscan 0.4 mg over 15-seconds with concurrent low level exercise and then Technetium 4m Sestamibi was injected at 30-seconds while the patient continued walking one more minute.The patient complained of DOE, but denied chest pain.  Quantitative spect images were obtained after a 45-minute delay. Stress ECG: No significant change from baseline ECG  QPS Raw Data Images:  Normal; no motion artifact; normal heart/lung ratio. Stress Images:  Normal homogeneous uptake in all areas of the myocardium. Rest Images:  Normal homogeneous uptake in all areas of the myocardium. Subtraction (SDS):  No evidence of ischemia. Transient Ischemic Dilatation (Normal <1.22):  1.03 Lung/Heart Ratio (Normal <0.45):  0.33  Quantitative Gated Spect Images QGS EDV:  86 ml QGS ESV:  33 ml  Impression Exercise Capacity:  Lexiscan with low level exercise. BP Response:  Normal blood pressure response. Clinical Symptoms:  No significant symptoms noted. ECG Impression:  No significant ST segment change suggestive of ischemia. Comparison with Prior Nuclear Study: No images to compare  Overall Impression:  Normal stress nuclear study.  LV Ejection Fraction: 62%.  LV Wall Motion:  NL LV Function; NL Wall Motion    Krystyl Cannell  Normal EF, normal study with no ischemia/infarction .  Please inform  patient.   Marca Ancona 03/13/2013 1:40 PM

## 2013-03-16 ENCOUNTER — Encounter: Payer: Self-pay | Admitting: Cardiology

## 2013-03-16 ENCOUNTER — Ambulatory Visit (INDEPENDENT_AMBULATORY_CARE_PROVIDER_SITE_OTHER): Payer: Medicare Other | Admitting: Cardiology

## 2013-03-16 VITALS — BP 116/70 | HR 86 | Ht 67.5 in | Wt 169.0 lb

## 2013-03-16 DIAGNOSIS — R079 Chest pain, unspecified: Secondary | ICD-10-CM | POA: Insufficient documentation

## 2013-03-16 DIAGNOSIS — E785 Hyperlipidemia, unspecified: Secondary | ICD-10-CM

## 2013-03-16 NOTE — Progress Notes (Signed)
appt 03/16/13 with Dr Shirlee Latch.

## 2013-03-16 NOTE — Progress Notes (Signed)
Patient ID: Erik Cola Sr., male   DOB: 07-21-1946, 67 y.o.   MRN: [id number] PCP: Dr. Marina Goodell  67 yo with history of nonobstructive CAD, fibromyalgia, and interstitial cystitis presented initially for evaluation of chest pain.  He has had chest pain in the past and had a catheterization in 2007 with a 40-50% LAD stenosis that was thought to be nonobstructive.  His LDL was quite high when last checked in 4/13 (181).  Patient's main complaint has been lower chest/epigastric area pain.  He feels this at night when lying in bed.  He can make it better by drinking carbonated drinks.  He has actually had this sensation off and on for a number of years.  It seem to be worse over the last few months.  He thinks that it feels different than reflux he has had in the past.  He had a GI workup recently that did not definitively identify the cause of the pain.  He denies exertional dyspnea.  He is on disability because of interstitial cystitis.  He has somewhat diffuse joint pain.  I had him do a Lexiscan Sestamibi.  This was a normal study, showing no evidence for ischemia or infarction.    Labs (4/13): TSH normal, K 4.9, creatinine 1.0, HDL 45, LDL 181  PMH: 1. H. Pylori gastritis 2. GERD with h/o esophageal stricture.  Has had Nissen fundoplication.  3. IBS 4. Interstitial cystitis 5. Fibromyalgia 6. Depression 7. Rheumatoid arthritis 8. CAD: LHC in 2007 with 40-50% proximal LAD stenosis.  Lexiscan Sestamibi (5/14): EF 62%, no ischemia or infarction.   SH: Lives in [place].  Nonsmoker since 1980.  Has been on disability because of interstitial cystitis.    FH: Mother with CVA, father with "heart trouble."    Current Outpatient Prescriptions  Medication Sig Dispense Refill  . amitriptyline (ELAVIL) 25 MG tablet Take 25 mg by mouth at bedtime.      Marland Kitchen aspirin EC 81 MG tablet Take 1 tablet (81 mg total) by mouth daily.      . clonazePAM (KLONOPIN) 0.5 MG tablet Take 0.5 mg by mouth as needed.       .  cyclobenzaprine (FLEXERIL) 5 MG tablet Take 5 mg by mouth 3 (three) times daily as needed for muscle spasms.      Marland Kitchen dicyclomine (BENTYL) 10 MG capsule Take 1 capsule (10 mg total) by mouth every 6 (six) hours as needed (for abdominal burning and cramping).  90 capsule  5  . LORazepam (ATIVAN) 0.5 MG tablet Take 0.5 mg by mouth daily as needed for anxiety.      . traMADol (ULTRAM) 50 MG tablet Take 25 mg by mouth as needed.        No current facility-administered medications for this visit.    BP 116/70  Pulse 86  Ht 5' 7.5" (1.715 m)  Wt 169 lb (76.658 kg)  BMI 26.06 kg/m2  SpO2 98% General: NAD Neck: No JVD, no thyromegaly or thyroid nodule.  Lungs: Clear to auscultation bilaterally with normal respiratory effort. CV: Nondisplaced PMI.  Heart regular S1/S2, no S3/S4, no murmur.  No peripheral edema.  No carotid bruit.  Normal pedal pulses.  Abdomen: Soft, nontender, no hepatosplenomegaly, no distention.  Neurologic: Alert and oriented x 3.  Psych: Normal affect. Extremities: No clubbing or cyanosis.   Assessment/Plan: 1. Chest pain: The chest pain is atypical.  It occurs at night when lying in bed and is not exertional.  Sometimes carbonated drinks relieve  it.  Lexiscan Sestamibi showed no evidence for ischemia or infarction.  I suspect that his symptoms are GI-related.  He should continue ASA 81 mg daily given prior history of nonobstructive CAD.  2. Hyperlipidemia: His LDL was quite high in 4/13.  He will not, however, consider going on a statin due to concern for worsening his chronic joint pain.   Marca Ancona 03/16/2013

## 2013-03-16 NOTE — Patient Instructions (Signed)
Your physician recommends that you schedule a follow-up appointment as needed with Dr McLean.  

## 2013-06-17 ENCOUNTER — Other Ambulatory Visit: Payer: Self-pay

## 2013-07-03 ENCOUNTER — Other Ambulatory Visit (INDEPENDENT_AMBULATORY_CARE_PROVIDER_SITE_OTHER): Payer: Medicare Other

## 2013-07-03 ENCOUNTER — Encounter: Payer: Self-pay | Admitting: Internal Medicine

## 2013-07-03 ENCOUNTER — Ambulatory Visit (INDEPENDENT_AMBULATORY_CARE_PROVIDER_SITE_OTHER): Payer: Medicare Other | Admitting: Internal Medicine

## 2013-07-03 VITALS — BP 114/70 | HR 65 | Temp 97.0°F | Ht 67.0 in | Wt 168.5 lb

## 2013-07-03 DIAGNOSIS — Z0000 Encounter for general adult medical examination without abnormal findings: Secondary | ICD-10-CM

## 2013-07-03 DIAGNOSIS — E785 Hyperlipidemia, unspecified: Secondary | ICD-10-CM

## 2013-07-03 DIAGNOSIS — Z23 Encounter for immunization: Secondary | ICD-10-CM

## 2013-07-03 LAB — URINALYSIS, ROUTINE W REFLEX MICROSCOPIC
Bilirubin Urine: NEGATIVE
Hgb urine dipstick: NEGATIVE
Ketones, ur: NEGATIVE
Leukocytes, UA: NEGATIVE
Nitrite: NEGATIVE
RBC / HPF: NONE SEEN (ref 0–?)
Specific Gravity, Urine: 1.02 (ref 1.000–1.030)
Total Protein, Urine: NEGATIVE
Urine Glucose: NEGATIVE
Urobilinogen, UA: 0.2 (ref 0.0–1.0)
pH: 6.5 (ref 5.0–8.0)

## 2013-07-03 LAB — CBC WITH DIFFERENTIAL/PLATELET
Basophils Absolute: 0.1 10*3/uL (ref 0.0–0.1)
Basophils Relative: 0.6 % (ref 0.0–3.0)
Eosinophils Absolute: 0.1 10*3/uL (ref 0.0–0.7)
Eosinophils Relative: 1.1 % (ref 0.0–5.0)
HCT: 46.8 % (ref 39.0–52.0)
Hemoglobin: 16.1 g/dL (ref 13.0–17.0)
Lymphocytes Relative: 28.9 % (ref 12.0–46.0)
Lymphs Abs: 2.4 10*3/uL (ref 0.7–4.0)
MCHC: 34.5 g/dL (ref 30.0–36.0)
MCV: 89.4 fl (ref 78.0–100.0)
Monocytes Absolute: 0.8 10*3/uL (ref 0.1–1.0)
Monocytes Relative: 9.7 % (ref 3.0–12.0)
Neutro Abs: 5 10*3/uL (ref 1.4–7.7)
Neutrophils Relative %: 59.7 % (ref 43.0–77.0)
Platelets: 294 10*3/uL (ref 150.0–400.0)
RBC: 5.23 Mil/uL (ref 4.22–5.81)
RDW: 12.5 % (ref 11.5–14.6)
WBC: 8.4 10*3/uL (ref 4.5–10.5)

## 2013-07-03 LAB — HEPATIC FUNCTION PANEL
ALT: 26 U/L (ref 0–53)
AST: 26 U/L (ref 0–37)
Albumin: 4.3 g/dL (ref 3.5–5.2)
Alkaline Phosphatase: 70 U/L (ref 39–117)
Bilirubin, Direct: 0.1 mg/dL (ref 0.0–0.3)
Total Bilirubin: 0.9 mg/dL (ref 0.3–1.2)
Total Protein: 7.5 g/dL (ref 6.0–8.3)

## 2013-07-03 LAB — BASIC METABOLIC PANEL
BUN: 18 mg/dL (ref 6–23)
CO2: 29 mEq/L (ref 19–32)
Calcium: 9.6 mg/dL (ref 8.4–10.5)
Chloride: 103 mEq/L (ref 96–112)
Creatinine, Ser: 1.2 mg/dL (ref 0.4–1.5)
GFR: 66.23 mL/min (ref >60.00)
Glucose, Bld: 99 mg/dL (ref 70–99)
Potassium: 4.8 mEq/L (ref 3.5–5.1)
Sodium: 137 mEq/L (ref 135–145)

## 2013-07-03 LAB — LIPID PANEL
Cholesterol: 255 mg/dL — ABNORMAL HIGH (ref 0–200)
HDL: 39.2 mg/dL (ref >39.00)
Total CHOL/HDL Ratio: 7
Triglycerides: 211 mg/dL — ABNORMAL HIGH (ref 0.0–149.0)
VLDL: 42.2 mg/dL — ABNORMAL HIGH (ref 0.0–40.0)

## 2013-07-03 LAB — LDL CHOLESTEROL, DIRECT: Direct LDL: 186.9 mg/dL

## 2013-07-03 LAB — PSA: PSA: 1.35 ng/mL (ref 0.10–4.00)

## 2013-07-03 LAB — TSH: TSH: 1.6 u[IU]/mL (ref 0.35–5.50)

## 2013-07-03 NOTE — Addendum Note (Signed)
Addended by: Scharlene Gloss B on: 07/03/2013 10:25 AM   Modules accepted: Orders

## 2013-07-03 NOTE — Patient Instructions (Addendum)
Please start the Aspirin 81 mg - 1 per day - Coated only You had the pneumonia shot today Please continue your efforts at being more active, low cholesterol diet, and weight control. You are otherwise up to date with prevention measures today. Please go to the LAB in the Basement (turn left off the elevator) for the tests to be done today You will be contacted by phone if any changes need to be made immediately.  Otherwise, you will receive a letter about your results with an explanation, but please check with MyChart first.  Please remember to sign up for My Chart if you have not done so, as this will be important to you in the future with finding out test results, communicating by private email, and scheduling acute appointments online when needed.  Please return in 1 year for your yearly visit, or sooner if needed, with Lab testing done 3-5 days before

## 2013-07-03 NOTE — Assessment & Plan Note (Signed)

## 2013-07-03 NOTE — Progress Notes (Signed)
Subjective:    Patient ID: Erik Cola Sr., male    DOB: 1946-05-17, 67 y.o.   MRN: [id number]  HPI  Here for wellness and f/u;  Overall doing ok;  Pt denies CP, worsening SOB, DOE, wheezing, orthopnea, PND, worsening LE edema, palpitations, dizziness or syncope.  Pt denies neurological change such as new headache, facial or extremity weakness.  Pt denies polydipsia, polyuria, or low sugar symptoms. Pt states overall good compliance with treatment and medications, good tolerability, and has been trying to follow lower cholesterol diet.  Pt denies worsening depressive symptoms, suicidal ideation or panic. No fever, night sweats, wt loss, loss of appetite, or other constitutional symptoms.  Pt states good ability with ADL's, has low fall risk, home safety reviewed and adequate, no other significant changes in hearing or vision, and only occasionally active with exercise. No acute compalints.  Has some minor urinary hesitatancy on start urination, declines med trial such as flomax.  Does not want to take zetia  Past Medical History  Diagnosis Date  . Allergic rhinitis   . Rheumatoid arthritis(714.0)   . Insomnia   . Anxiety   . CAD (coronary artery disease)   . Diverticulitis   . Fibromyalgia   . GERD with stricture   . HLD (hyperlipidemia)   . HTN (hypertension)   . Interstitial cystitis   . IBS (irritable bowel syndrome)   . Anal fissure   . Depression   . Hiatal hernia   . Internal and external hemorrhoids without complication   . FIBROMYALGIA 06/09/2010    Qualifier: Diagnosis of  By: Leone Payor MD, Charlyne Quale Irritable bowel syndrome 06/09/2010    Qualifier: Diagnosis of  By: Leone Payor MD, Charlyne Quale INTERSTITIAL CYSTITIS 06/09/2010    Qualifier: Diagnosis of  By: Leone Payor MD, Charlyne Quale Diverticulosis 12/18/2011  . 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 Helicobacter pylori (H. pylori) infection 11/25/2012    11/2012 EGD + gastric bxs  . Impotence of organic origin    Past  Surgical History  Procedure Laterality Date  . Nissen fundoplication  2009  . Tonsillectomy and adenoidectomy    . Vasectomy    . Knee arthroscopy      right x 2  . Transurethral resection of prostate      x 2  . Bladder distention      x 6  . Shoulder arthroscopy  2010    left  . Colonoscopy  05/10/2006    diverticulosis, internal and external hemorrhoids  . Upper gastrointestinal endoscopy  05/13/2008    hiatal hernia  . Appendectomy  1957    reports that he has quit smoking. He has never used smokeless tobacco. He reports that he does not drink alcohol or use illicit drugs. family history includes Breast cancer in his sister; Colon polyps in his father; Diabetes in his father, other, and sister; Heart disease in his father and mother; Hypertension in his father and mother; Stroke in his mother. There is no history of Colon cancer. Allergies  Allergen Reactions  . Prilosec [Omeprazole] Other (See Comments)    Per patient joint pain with PPI's  . Statins Other (See Comments)    Joint pain   No current outpatient prescriptions on file prior to visit.   No current facility-administered medications on file prior to visit.    Review of Systems Constitutional: Negative for diaphoresis, activity change, appetite change or unexpected weight change.  HENT: Negative  for hearing loss, ear pain, facial swelling, mouth sores and neck stiffness.   Eyes: Negative for pain, redness and visual disturbance.  Respiratory: Negative for shortness of breath and wheezing.   Cardiovascular: Negative for chest pain and palpitations.  Gastrointestinal: Negative for diarrhea, blood in stool, abdominal distention or other pain Genitourinary: Negative for hematuria, flank pain or change in urine volume.  Musculoskeletal: Negative for myalgias and joint swelling.  Skin: Negative for color change and wound.  Neurological: Negative for syncope and numbness. other than noted Hematological: Negative for  adenopathy.  Psychiatric/Behavioral: Negative for hallucinations, self-injury, decreased concentration and agitation.      Objective:   Physical Exam BP 114/70  Pulse 65  Temp(Src) 97 F (36.1 C) (Oral)  Ht 5\' 7"  (1.702 m)  Wt 168 lb 8 oz (76.431 kg)  BMI 26.38 kg/m2  SpO2 96% VS noted,  Constitutional: Pt is oriented to person, place, and time. Appears well-developed and well-nourished.  Head: Normocephalic and atraumatic.  Right Ear: External ear normal.  Left Ear: External ear normal.  Nose: Nose normal.  Mouth/Throat: Oropharynx is clear and moist.  Eyes: Conjunctivae and EOM are normal. Pupils are equal, round, and reactive to light.  Neck: Normal range of motion. Neck supple. No JVD present. No tracheal deviation present.  Cardiovascular: Normal rate, regular rhythm, normal heart sounds and intact distal pulses.   Pulmonary/Chest: Effort normal and breath sounds normal.  Abdominal: Soft. Bowel sounds are normal. There is no tenderness. No HSM  Musculoskeletal: Normal range of motion. Exhibits no edema.  Lymphadenopathy:  Has no cervical adenopathy.  Neurological: Pt is alert and oriented to person, place, and time. Pt has normal reflexes. No cranial nerve deficit.  Skin: Skin is warm and dry. No rash noted.  Psychiatric:  Has  normal mood and affect. Behavior is normal. mild nervous    Assessment & Plan:

## 2013-07-03 NOTE — Assessment & Plan Note (Signed)
Declines statin or zetia, for lower chol diet

## 2013-09-17 ENCOUNTER — Other Ambulatory Visit: Payer: Self-pay

## 2014-04-12 ENCOUNTER — Emergency Department: Payer: Self-pay | Admitting: Emergency Medicine

## 2014-06-14 ENCOUNTER — Encounter: Payer: Self-pay | Admitting: Rheumatology

## 2014-06-18 ENCOUNTER — Encounter: Payer: Self-pay | Admitting: Radiology

## 2014-06-18 ENCOUNTER — Encounter (INDEPENDENT_AMBULATORY_CARE_PROVIDER_SITE_OTHER): Payer: PRIVATE HEALTH INSURANCE

## 2014-06-18 ENCOUNTER — Encounter: Payer: Self-pay | Admitting: Cardiovascular Disease

## 2014-06-18 ENCOUNTER — Ambulatory Visit (INDEPENDENT_AMBULATORY_CARE_PROVIDER_SITE_OTHER): Payer: PRIVATE HEALTH INSURANCE | Admitting: Cardiovascular Disease

## 2014-06-18 VITALS — BP 123/76 | HR 78 | Ht 67.0 in | Wt 167.0 lb

## 2014-06-18 DIAGNOSIS — R001 Bradycardia, unspecified: Secondary | ICD-10-CM

## 2014-06-18 DIAGNOSIS — I498 Other specified cardiac arrhythmias: Secondary | ICD-10-CM

## 2014-06-18 DIAGNOSIS — R002 Palpitations: Secondary | ICD-10-CM

## 2014-06-18 LAB — TSH: TSH: 1.975 u[IU]/mL (ref 0.350–4.500)

## 2014-06-18 LAB — T4, FREE: Free T4: 1.12 ng/dL (ref 0.80–1.80)

## 2014-06-18 NOTE — Patient Instructions (Signed)
Your physician recommends that you have lab work today: TSH and Free T4  Your physician has recommended that you wear a 48 hour holter monitor. Holter monitors are medical devices that record the heart's electrical activity. Doctors most often use these monitors to diagnose arrhythmias. Arrhythmias are problems with the speed or rhythm of the heartbeat. The monitor is a small, portable device. You can wear one while you do your normal daily activities. This is usually used to diagnose what is causing palpitations/syncope (passing out).  Your physician recommends that you schedule a follow-up appointment in: 3 MONTHS with Dr Aundra Dubin

## 2014-06-18 NOTE — Progress Notes (Signed)
    HPI:  68 year old gentleman referred by his primary care physician because of bradycardia seen on EKG this morning. Patient went in for blood work today. He was noted to be bradycardic and an EKG was performed. This demonstrated marked sinus bradycardia 38 beats per minute. There were no ST changes noted. He was referred for further evaluation.  The patient has been followed by Dr. Aundra Dubin. He's had nonobstructive coronary artery disease by cardiac catheterization in 2007. This showed nonobstructive LAD stenosis of approximately 40% in the mid vessel. He has had normal left ventricular systolic function. He has no history of congestive heart failure. He has no history of arrhythmia.  The patient complains of fatigue and dizziness. He has not had syncope. He has never been told of bradycardia in the past. He's been on disability since 1999 because of interstitial cystitis. He also has fibromyalgia. He has significant hyperlipidemia, but has refused to take statin drugs because of chronic joint pains.  Outpatient Encounter Prescriptions as of 06/18/2014  Medication Sig  . ALOE VERA EX Apply topically as directed.  Marland Kitchen HYDROcodone-acetaminophen (NORCO/VICODIN) 5-325 MG per tablet Take 1 tablet by mouth every 6 (six) hours as needed for moderate pain.  Marland Kitchen LORazepam (ATIVAN) 0.5 MG tablet Take 0.5 mg by mouth every 8 (eight) hours.  . NON FORMULARY Heparin  As directed    Allergies not on file  Past Medical History  Diagnosis Date  . Coronary atherosclerosis of native coronary artery 2007    nonobstructive CAD by cath with 40% mid-LAD stenosis  . Hyperlipidemia   . Fibromyalgia     ROS: Negative except as per HPI  BP 123/76  Pulse 78  Ht 5\' 7"  (1.702 m)  Wt 167 lb (75.751 kg)  BMI 26.15 kg/m2  PHYSICAL EXAM: Pt is alert and oriented, NAD HEENT: normal Neck: JVP - normal, carotids 2+= without bruits Lungs: CTA bilaterally CV: RRR without murmur or gallop Abd: soft, NT, Positive BS,  no hepatomegaly Ext: no C/C/E, distal pulses intact and equal Skin: warm/dry no rash  EKG:  06/18/2014: Marked sinus bradycardia 38 beats per minute, otherwise normal limits.  ASSESSMENT AND PLAN: 1. Marked sinus bradycardia, question associated symptoms. The patient has somewhat chronic fatigue. His heart rate is normal in our office today, but he did have marked bradycardia this morning. He is on no AV nodal blockers. I recommended that we check thyroid function studies and a 48-hour Holter monitor. He should followup with Dr. Aundra Dubin in 3 months.  2. Coronary artery disease, nonobstructive. Myoview scan last year was reviewed and this was negative for ischemia. LV function was normal. Continue observation. He has been unwilling to take statin drugs.  **Note this patient has 2 medical record numbers. All of his records are available under a different medical record number. This chart will be put in for a 'merge.'**  Sherren Mocha MD 06/18/2014 5:04 PM

## 2014-06-24 ENCOUNTER — Encounter: Payer: Self-pay | Admitting: Internal Medicine

## 2014-06-28 ENCOUNTER — Telehealth: Payer: Self-pay | Admitting: Cardiovascular Disease

## 2014-06-28 NOTE — Telephone Encounter (Signed)
Spoke with patient about recent lab results and monitor results. Pt advised lab or monitor results not  reviewed by MD yet, given prelim reports on both.  Monitor report done 06/18/14 here for Dr Aundra Dubin to review.

## 2014-06-28 NOTE — Telephone Encounter (Signed)
This is Dr Claris Gladden patient.  The pt was an add-on for Dr Burt Knack 06/18/14 and he ordered a TSH, Free T4 and 48 hour holter monitor.

## 2014-06-28 NOTE — Telephone Encounter (Signed)
New message    Calling for test results  

## 2014-07-02 NOTE — Telephone Encounter (Signed)
Dr Aundra Dubin reviewed monitor:  No significant arrhythmias. LM on voice ID voice mail at pt's request.

## 2014-07-16 ENCOUNTER — Other Ambulatory Visit: Payer: Self-pay

## 2014-07-16 DIAGNOSIS — R002 Palpitations: Secondary | ICD-10-CM

## 2014-09-20 ENCOUNTER — Ambulatory Visit (INDEPENDENT_AMBULATORY_CARE_PROVIDER_SITE_OTHER): Payer: PRIVATE HEALTH INSURANCE | Admitting: Cardiology

## 2014-09-20 ENCOUNTER — Encounter: Payer: Self-pay | Admitting: Internal Medicine

## 2014-09-20 ENCOUNTER — Encounter: Payer: Self-pay | Admitting: Cardiology

## 2014-09-20 ENCOUNTER — Encounter: Payer: Self-pay | Admitting: *Deleted

## 2014-09-20 ENCOUNTER — Ambulatory Visit (INDEPENDENT_AMBULATORY_CARE_PROVIDER_SITE_OTHER): Payer: PRIVATE HEALTH INSURANCE | Admitting: Internal Medicine

## 2014-09-20 VITALS — BP 152/80 | HR 72 | Ht 67.0 in | Wt 172.6 lb

## 2014-09-20 VITALS — BP 110/82 | HR 64 | Ht 67.0 in | Wt 171.8 lb

## 2014-09-20 DIAGNOSIS — R143 Flatulence: Secondary | ICD-10-CM

## 2014-09-20 DIAGNOSIS — K589 Irritable bowel syndrome without diarrhea: Secondary | ICD-10-CM

## 2014-09-20 DIAGNOSIS — R001 Bradycardia, unspecified: Secondary | ICD-10-CM

## 2014-09-20 DIAGNOSIS — E785 Hyperlipidemia, unspecified: Secondary | ICD-10-CM

## 2014-09-20 DIAGNOSIS — N3010 Interstitial cystitis (chronic) without hematuria: Secondary | ICD-10-CM

## 2014-09-20 DIAGNOSIS — R0789 Other chest pain: Secondary | ICD-10-CM

## 2014-09-20 DIAGNOSIS — IMO0001 Reserved for inherently not codable concepts without codable children: Secondary | ICD-10-CM

## 2014-09-20 DIAGNOSIS — I25118 Atherosclerotic heart disease of native coronary artery with other forms of angina pectoris: Secondary | ICD-10-CM

## 2014-09-20 DIAGNOSIS — R079 Chest pain, unspecified: Secondary | ICD-10-CM

## 2014-09-20 MED ORDER — DICYCLOMINE HCL 20 MG PO TABS: 10.0000 mg | ORAL_TABLET | Freq: Four times a day (QID) | ORAL | Status: DC | PRN

## 2014-09-20 MED ORDER — EZETIMIBE 10 MG PO TABS
10.0000 mg | ORAL_TABLET | Freq: Every day | ORAL | Status: DC
Start: 2014-09-20 — End: 2015-02-01

## 2014-09-20 NOTE — Patient Instructions (Signed)
Your physician has requested that you have a lexiscan myoview. For further information please visit [email or web address]. Please follow instruction sheet, as given.  Start Zetia 10mg  daily.  Dr Aundra Dubin recommends that you take aspirin 81mg  daily.  Your physician recommends that you have a  lipid profile today.  Your physician recommends that you return for a FASTING lipid profile /liver profile in 2 months.  Your physician wants you to follow-up in: 1 year with Dr Aundra Dubin. (November 2016).  You will receive a reminder letter in the mail two months in advance. If you don't receive a letter, please call our office to schedule the follow-up appointment.

## 2014-09-20 NOTE — Progress Notes (Signed)
Patient ID: Erik Lolling Sr., male   DOB: [birth year], 68 y.o.   MRN: [id number] PCP: Dr. Henrene Pastor  68 yo with history of nonobstructive CAD, fibromyalgia, and interstitial cystitis presents for cardiology followup.  He has had chest pain in the past and had a catheterization in 2007 with a 40-50% LAD stenosis that was thought to be nonobstructive.  His LDL was quite high when last checked in 4/13 (181).  He is on disability because of interstitial cystitis.  Lexiscan Cardiolite in 5/14 was a normal study, showing no evidence for ischemia or infarction.    Patient was seen in 8/15 because of bradycardia to the 30s noted on ECG by this PCP.  He denies lightheadedness or syncope.  Holter in 9/15 did not show any significant bradycardia.  HR today is 64.  Main complaint today is central burning chest pain.  This will happen 2-3 times/month.  He is noticing this more now than in the past.  It will last 15-20 minutes at a time.  It is not related to exertion.  No particular trigger.  He is chronically short of breath after walking 1/4 mile or going up steps.    ECG: NSR, normal  Labs (4/13): TSH normal, K 4.9, creatinine 1.0, HDL 45, LDL 181 Labs (8/15): TSH normal  PMH: 1. H. Pylori gastritis 2. GERD with h/o esophageal stricture.  Has had Nissen fundoplication.  3. IBS 4. Interstitial cystitis 5. Fibromyalgia 6. Depression 7. Rheumatoid arthritis 8. CAD: LHC in 2007 with 40-50% proximal LAD stenosis.  Lexiscan Sestamibi (5/14): EF 62%, no ischemia or infarction.  9. OA: Knee pain.  10. Bradycardia: Holter (9/15) with HR 48-120 bpm, mean 77; no significant arrhythmias.   SH: Lives in [place].  Nonsmoker since 1980.  Has been on disability because of interstitial cystitis.    FH: Mother with CVA, father with "heart trouble."    ROS: All systems reviewed and negative except as per HPI.   Current Outpatient Prescriptions  Medication Sig Dispense Refill  . ALOE VERA EX Apply topically as  directed.    Marland Kitchen aspirin EC 81 MG tablet Take 1 tablet (81 mg total) by mouth daily.    . celecoxib (CELEBREX) 200 MG capsule Take 200 mg by mouth daily.    Marland Kitchen dicyclomine (BENTYL) 20 MG tablet Take 0.5 tablets (10 mg total) by mouth every 6 (six) hours as needed for spasms. May take a whole tablet 120 tablet 0  . ezetimibe (ZETIA) 10 MG tablet Take 1 tablet (10 mg total) by mouth daily. 30 tablet 3  . HYDROcodone-acetaminophen (NORCO/VICODIN) 5-325 MG per tablet Take 1 tablet by mouth every 6 (six) hours as needed for moderate pain.    Marland Kitchen LORazepam (ATIVAN) 0.5 MG tablet Take 0.5 mg by mouth as needed.    . NON FORMULARY Heparin  As directed    . traMADol (ULTRAM) 50 MG tablet Take 50 mg by mouth as needed.     No current facility-administered medications for this visit.    BP 110/82 mmHg  Pulse 64  Ht 5\' 7"  (1.702 m)  Wt 171 lb 12.8 oz (77.928 kg)  BMI 26.90 kg/m2 General: NAD Neck: No JVD, no thyromegaly or thyroid nodule.  Lungs: Clear to auscultation bilaterally with normal respiratory effort. CV: Nondisplaced PMI.  Heart regular S1/S2, no S3/S4, no murmur.  No peripheral edema.  No carotid bruit.  Normal pedal pulses.  Abdomen: Soft, nontender, no hepatosplenomegaly, no distention.  Neurologic: Alert and oriented  x 3.  Psych: Normal affect. Extremities: No clubbing or cyanosis.   Assessment/Plan: 1. Chest pain: Atypical, nonexertional chest pain.  He is noticing it more than in the past.  It may be GI-related.  He has a GI appointment later today.  I will arrange for Lexiscan Cardiolite (hard to walk on treadmill with knee pain).  He will need to take ASA 81 mg daily.  2. Hyperlipidemia: His LDL was quite high in 4/13.  He will not, however, consider going on a statin due to concern for worsening his chronic joint pain.  I will check baseline lipids today and have him start Zetia 10 mg daily with lipids/LFTs in 2 months.  3. Bradycardia: Sinus brady to 30s noted in the summer,  however he has had no lightheadedness/syncope and holter in 9/15 did not show significant bradycardia. TSH was normal.   Loralie Champagne 09/20/2014

## 2014-09-20 NOTE — Patient Instructions (Signed)
Today you have been given a low gas diet to follow.  Call us after your stress test is done.   We have sent the following medications to your pharmacy for you to pick up at your convenience: Generic Bentyl  Please purchase a probiotic and take it daily.    I appreciate the opportunity to care for you.

## 2014-09-20 NOTE — Progress Notes (Signed)
Subjective:    Patient ID: Erik Lolling Sr., male    DOB: 05-Oct-1946, 68 y.o.   MRN: [id number]  Last name pronounced "Hagy"  HPI The patient is here with his wife because of chest pain and a multitude of other symptoms. I have seen him over the years for functional complaints including globus sensation, abdominal pain, he did have H. Pylori gastritis treated last year.  He is status post a fundoplication procedure. He has long-standing gas bloat and still expels a lot of flatulence of the end of the day. He also has a diagnosis of IBS and interstitial cystitis.  The chest pain is a burning pain that comes and goes without rhyme or reason. He cannot really exert himself because of bad knees so he doesn't know if it's exertional. There is not significant dyspnea. He just saw a cardiologist today and Dr. Benjamine Mola is recommended a stress test and that is to be scheduled.  He is having burning in the suprapubic area consistent with interstitial cystitis pain. He is due to see [name] next week.  He also reports a lot of stress and wonders if that might not be involved. He is taking lorazepam half of a 0.5 mg tablet at bedtime with some success. He is asking about an IBS medication.  Wt Readings from Last 3 Encounters:  09/20/14 172 lb 9.6 oz (78.291 kg)  09/20/14 171 lb 12.8 oz (77.928 kg)  06/18/14 167 lb (75.751 kg)   Allergies  Allergen Reactions  . Prilosec [Omeprazole] Other (See Comments)    Per patient joint pain with PPI's  . Statins Other (See Comments)    Joint pain   Outpatient Prescriptions Prior to Visit  Medication Sig Dispense Refill  . ALOE VERA EX Apply topically as directed.    . celecoxib (CELEBREX) 200 MG capsule Take 200 mg by mouth daily.    Marland Kitchen HYDROcodone-acetaminophen (NORCO/VICODIN) 5-325 MG per tablet Take 1 tablet by mouth every 6 (six) hours as needed for moderate pain.    Marland Kitchen LORazepam (ATIVAN) 0.5 MG tablet Take 0.5 mg by mouth as needed.    . NON  FORMULARY Heparin  As directed    . traMADol (ULTRAM) 50 MG tablet Take 50 mg by mouth as needed.    Marland Kitchen aspirin EC 81 MG tablet Take 1 tablet (81 mg total) by mouth daily.    Marland Kitchen ezetimibe (ZETIA) 10 MG tablet Take 1 tablet (10 mg total) by mouth daily. 30 tablet 3   No facility-administered medications prior to visit.   Past Medical History  Diagnosis Date  . Allergic rhinitis   . Rheumatoid arthritis(714.0)   . Insomnia   . Anxiety   . CAD (coronary artery disease)   . Diverticulitis   . GERD with stricture   . HLD (hyperlipidemia)   . HTN (hypertension)   . Interstitial cystitis   . IBS (irritable bowel syndrome)   . Anal fissure   . Depression   . Hiatal hernia   . Internal and external hemorrhoids without complication   . FIBROMYALGIA 06/09/2010    Qualifier: Diagnosis of  By: Carlean Purl MD, Dimas Millin Irritable bowel syndrome 06/09/2010    Qualifier: Diagnosis of  By: Carlean Purl MD, FACG, [place] CYSTITIS 06/09/2010    Qualifier: Diagnosis of  By: Carlean Purl MD, Dimas Millin Diverticulosis 12/18/2011  . 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 Helicobacter pylori (H. pylori) infection 11/25/2012  11/2012 EGD + gastric bxs  . Impotence of organic origin   . Coronary atherosclerosis of native coronary artery 2007    nonobstructive CAD by cath with 40% mid-LAD stenosis  . Hyperlipidemia   . Fibromyalgia   . Reactive hypoglycemia    Past Surgical History  Procedure Laterality Date  . Nissen fundoplication  3536  . Tonsillectomy and adenoidectomy    . Vasectomy    . Knee arthroscopy      right x 2  . Transurethral resection of prostate      x 2  . Bladder distention      x 6  . Shoulder arthroscopy  2010    left  . Colonoscopy  05/10/2006    diverticulosis, internal and external hemorrhoids  . Upper gastrointestinal endoscopy  05/13/2008    hiatal hernia  . Appendectomy  1957   History   Social History  . Marital Status: Unknown    Spouse Name: N/A     Number of Children: 4  . Years of Education: N/A   Occupational History  . retired    Social History Main Topics  . Smoking status: Former Research scientist (life sciences)  . Smokeless tobacco: Never Used     Comment: quit 1980  . Alcohol Use: No  . Drug Use: No     Review of Systems As above    Objective:   Physical Exam Well-developed well-nourished elderly man in no acute distress Lungs are clear Heart S1 and S2 with murmurs or gallops Xiphoid is somewhat tender The abdomen is soft, slightly distended nontender no organomegaly or masses are well-healed laparoscopic scars. He is alert and oriented 3 Affect is somewhat anxious  Reviewed prior GI workups,      Assessment & Plan:   1. Chest pain, unspecified chest pain type   2. IBS (irritable bowel syndrome)   3. Gas-Bloat Syndrome   4. Interstitial cystitis     1. Chest pain is probably functional but he needs to proceed with a stress test. Can consider further GI workup after that. However seems unlikely that I would learn anything new with further testing. Could consider esophageal manometry. 2. Low gas and flatulence diet provided and I explained gas bloat syndrome 3. Dicyclomine 10-20 mg every 6 hours when necessary 4. Continue bedtime lorazepam and consider other efforts at stress reduction coordinated through primary care 5. Align 1 each day 6. See [name] for his interstitial cystitis 7. He is to call me when his stress test is complete 8. Overall strong likelihood of functional gastrointestinal and/or other functional disturbance. Given the comorbidities of interstitial cystitis and fibromyalgia as well as IBS.  CC: PERRY,LAWRENCE EDWARD, MD Will also send a copy to Dr. Alona Bene and Dr. Loralie Champagne

## 2014-09-27 ENCOUNTER — Encounter (HOSPITAL_COMMUNITY): Payer: PRIVATE HEALTH INSURANCE

## 2014-10-26 ENCOUNTER — Encounter (HOSPITAL_COMMUNITY): Payer: PRIVATE HEALTH INSURANCE

## 2014-11-22 ENCOUNTER — Encounter: Payer: Self-pay | Admitting: Cardiology

## 2014-11-23 ENCOUNTER — Other Ambulatory Visit: Payer: PRIVATE HEALTH INSURANCE

## 2014-11-29 DIAGNOSIS — M75122 Complete rotator cuff tear or rupture of left shoulder, not specified as traumatic: Secondary | ICD-10-CM | POA: Diagnosis not present

## 2014-12-07 DIAGNOSIS — M25561 Pain in right knee: Secondary | ICD-10-CM | POA: Diagnosis not present

## 2014-12-07 DIAGNOSIS — M25512 Pain in left shoulder: Secondary | ICD-10-CM | POA: Diagnosis not present

## 2014-12-07 DIAGNOSIS — M1711 Unilateral primary osteoarthritis, right knee: Secondary | ICD-10-CM | POA: Diagnosis not present

## 2014-12-13 DIAGNOSIS — G894 Chronic pain syndrome: Secondary | ICD-10-CM | POA: Diagnosis not present

## 2014-12-13 DIAGNOSIS — M25569 Pain in unspecified knee: Secondary | ICD-10-CM | POA: Diagnosis not present

## 2014-12-13 DIAGNOSIS — M542 Cervicalgia: Secondary | ICD-10-CM | POA: Diagnosis not present

## 2014-12-13 DIAGNOSIS — M797 Fibromyalgia: Secondary | ICD-10-CM | POA: Diagnosis not present

## 2015-01-04 DIAGNOSIS — F450 Somatization disorder: Secondary | ICD-10-CM | POA: Diagnosis not present

## 2015-01-06 DIAGNOSIS — K580 Irritable bowel syndrome with diarrhea: Secondary | ICD-10-CM | POA: Diagnosis not present

## 2015-01-06 DIAGNOSIS — N3010 Interstitial cystitis (chronic) without hematuria: Secondary | ICD-10-CM | POA: Diagnosis not present

## 2015-01-06 DIAGNOSIS — R300 Dysuria: Secondary | ICD-10-CM | POA: Diagnosis not present

## 2015-01-06 DIAGNOSIS — F329 Major depressive disorder, single episode, unspecified: Secondary | ICD-10-CM | POA: Diagnosis not present

## 2015-01-06 DIAGNOSIS — R350 Frequency of micturition: Secondary | ICD-10-CM | POA: Diagnosis not present

## 2015-01-10 DIAGNOSIS — M1711 Unilateral primary osteoarthritis, right knee: Secondary | ICD-10-CM | POA: Diagnosis not present

## 2015-01-17 DIAGNOSIS — M1711 Unilateral primary osteoarthritis, right knee: Secondary | ICD-10-CM | POA: Diagnosis not present

## 2015-01-17 DIAGNOSIS — M25512 Pain in left shoulder: Secondary | ICD-10-CM | POA: Diagnosis not present

## 2015-01-18 ENCOUNTER — Telehealth: Payer: Self-pay | Admitting: Internal Medicine

## 2015-01-18 DIAGNOSIS — R3915 Urgency of urination: Secondary | ICD-10-CM | POA: Diagnosis not present

## 2015-01-18 DIAGNOSIS — N3010 Interstitial cystitis (chronic) without hematuria: Secondary | ICD-10-CM | POA: Diagnosis not present

## 2015-01-18 DIAGNOSIS — N3289 Other specified disorders of bladder: Secondary | ICD-10-CM | POA: Diagnosis not present

## 2015-01-18 DIAGNOSIS — F329 Major depressive disorder, single episode, unspecified: Secondary | ICD-10-CM | POA: Diagnosis not present

## 2015-01-18 DIAGNOSIS — D45 Polycythemia vera: Secondary | ICD-10-CM | POA: Diagnosis not present

## 2015-01-18 DIAGNOSIS — R300 Dysuria: Secondary | ICD-10-CM | POA: Diagnosis not present

## 2015-01-18 DIAGNOSIS — R350 Frequency of micturition: Secondary | ICD-10-CM | POA: Diagnosis not present

## 2015-01-18 NOTE — Telephone Encounter (Signed)
Pt called in and seen you once back in 2014 for CPE.  He never established but he wanted to know if you would be willing to take him on as a pt?

## 2015-01-18 NOTE — Telephone Encounter (Signed)
Ok with me 

## 2015-01-20 ENCOUNTER — Ambulatory Visit (HOSPITAL_COMMUNITY): Payer: Medicare Other | Attending: Cardiology | Admitting: Radiology

## 2015-01-20 DIAGNOSIS — I25118 Atherosclerotic heart disease of native coronary artery with other forms of angina pectoris: Secondary | ICD-10-CM

## 2015-01-20 DIAGNOSIS — R0789 Other chest pain: Secondary | ICD-10-CM

## 2015-01-20 DIAGNOSIS — R079 Chest pain, unspecified: Secondary | ICD-10-CM | POA: Insufficient documentation

## 2015-01-20 DIAGNOSIS — R0602 Shortness of breath: Secondary | ICD-10-CM | POA: Diagnosis not present

## 2015-01-20 DIAGNOSIS — R0609 Other forms of dyspnea: Secondary | ICD-10-CM | POA: Diagnosis not present

## 2015-01-20 DIAGNOSIS — E785 Hyperlipidemia, unspecified: Secondary | ICD-10-CM

## 2015-01-20 MED ORDER — TECHNETIUM TC 99M SESTAMIBI GENERIC - CARDIOLITE
11.0000 | Freq: Once | INTRAVENOUS | Status: AC | PRN
Start: 2015-01-20 — End: 2015-01-20
  Administered 2015-01-20: 11 via INTRAVENOUS

## 2015-01-20 MED ORDER — TECHNETIUM TC 99M SESTAMIBI GENERIC - CARDIOLITE
33.0000 | Freq: Once | INTRAVENOUS | Status: AC | PRN
  Administered 2015-01-20: 33 via INTRAVENOUS

## 2015-01-20 MED ORDER — REGADENOSON 0.4 MG/5ML IV SOLN
0.4000 mg | Freq: Once | INTRAVENOUS | Status: AC
  Administered 2015-01-20: 0.4 mg via INTRAVENOUS

## 2015-01-20 NOTE — Progress Notes (Addendum)
[facility] [address] [place], [place] [address] [phone]    Cardiology Nuclear Med Study  Erik COLLINGSWORTH Sr. is a 69 y.o. male     MRN : [id number]     DOB: 11/30/1945  Procedure Date: 01/20/2015  Nuclear Med Background Indication for Stress Test:  Evaluation for Ischemia History:  N/O CAD 2014 MPI NL Cardiac Risk Factors: N/A  Symptoms:  Chest Pain, DOE and SOB   Nuclear Pre-Procedure Caffeine/Decaff Intake:  None NPO After: 7:00pm   Lungs:  clear O2 Sat: 97% on room air. IV 0.9% NS with Angio Cath:  22g  IV Site: R Hand  IV Started by:  Crissie Figures, RN  Chest Size (in):  46 Cup Size: n/a  Height: 5\' 7"  (1.702 m)  Weight:  172 lb (78.019 kg)  BMI:  Body mass index is 26.93 kg/(m^2). Tech Comments:  N/A    Nuclear Med Study 1 or 2 day study: 1 day  Stress Test Type:  Lexiscan  Reading MD: N/A  Order Authorizing Provider:  Loralie Champagne, MD  Resting Radionuclide: Technetium 40m Sestamibi  Resting Radionuclide Dose: 11.0 mCi   Stress Radionuclide:  Technetium 69m Sestamibi  Stress Radionuclide Dose: 33.0 mCi           Stress Protocol Rest HR: 61 Stress HR: 99  Rest BP: 133/87 Stress BP: 150/90  Exercise Time (min): n/a METS: n/a   Predicted Max HR: 153 bpm % Max HR: 64.71 bpm Rate Pressure Product: 14850   Dose of Adenosine (mg):  n/a Dose of Lexiscan: 0.4 mg  Dose of Atropine (mg): n/a Dose of Dobutamine: n/a mcg/kg/min (at max HR)  Stress Test Technologist: Perrin Maltese, EMT-P  Nuclear Technologist:  Annye Rusk, CNMT     Rest Procedure:  Myocardial perfusion imaging was performed at rest 45 minutes following the intravenous administration of Technetium 45m Sestamibi. Rest ECG: NSR - Normal EKG  Stress Procedure:  The patient received IV Lexiscan 0.4 mg over 15-seconds.  Technetium 60m Sestamibi injected at 30-seconds. This patient was sob with the Lexiscan injection. Quantitative spect images were obtained after a 45  minute delay. Stress ECG: No significant change from baseline ECG  QPS Raw Data Images:  Normal; no motion artifact; normal heart/lung ratio. Stress Images:  minimal apical thinning with normal uptake in the other areas of the myocardium  Rest Images:  mild apical thinning with normal uptake in the other areas. Subtraction (SDS):  No evidence of ischemia. Transient Ischemic Dilatation (Normal <1.22):  0.83 Lung/Heart Ratio (Normal <0.45):  0.31  Quantitative Gated Spect Images QGS EDV:  70 ml QGS ESV:  27 ml  Impression Exercise Capacity:  Lexiscan with no exercise. BP Response:  Normal blood pressure response. Clinical Symptoms:  No significant symptoms noted. ECG Impression:  No significant ST segment change suggestive of ischemia. Comparison with Prior Nuclear Study: No significant change from previous study on 03/13/13.   Overall Impression:  Normal stress nuclear study.  There is no evidence of ischemia.    LV Ejection Fraction: 62%.  LV Wall Motion:  NL LV Function; NL Wall Motion.   Thayer Headings, Brooke Bonito., MD, [facility] 01/20/2015, 2:33 PM [address],  [address] Office [phone] Pager [phone]  Normal study, please report to patient.   Loralie Champagne 01/21/2015

## 2015-01-24 ENCOUNTER — Telehealth: Payer: Self-pay | Admitting: Cardiology

## 2015-01-24 DIAGNOSIS — M1711 Unilateral primary osteoarthritis, right knee: Secondary | ICD-10-CM | POA: Diagnosis not present

## 2015-01-24 NOTE — Telephone Encounter (Signed)
Spoke with patient's wife about recent myoview results.

## 2015-01-24 NOTE — Progress Notes (Signed)
LMTCB

## 2015-01-24 NOTE — Progress Notes (Signed)
Pt's wife notified. She states she will notify pt.

## 2015-01-24 NOTE — Telephone Encounter (Signed)
New problem    Pt returning your call and stated you can leave results on his vm.

## 2015-01-26 ENCOUNTER — Telehealth: Payer: Self-pay | Admitting: Internal Medicine

## 2015-01-26 NOTE — Telephone Encounter (Signed)
Patient with worsening abdominal pain for the last few weeks.  He will come in and see Alonza Bogus, PA on 02/01/15

## 2015-01-26 NOTE — Telephone Encounter (Signed)
Left message for patient to call back  

## 2015-01-27 DIAGNOSIS — M25512 Pain in left shoulder: Secondary | ICD-10-CM | POA: Diagnosis not present

## 2015-01-27 DIAGNOSIS — G8929 Other chronic pain: Secondary | ICD-10-CM | POA: Diagnosis not present

## 2015-01-31 DIAGNOSIS — R32 Unspecified urinary incontinence: Secondary | ICD-10-CM | POA: Diagnosis not present

## 2015-02-01 ENCOUNTER — Ambulatory Visit (INDEPENDENT_AMBULATORY_CARE_PROVIDER_SITE_OTHER): Payer: Medicare Other | Admitting: Gastroenterology

## 2015-02-01 ENCOUNTER — Encounter: Payer: Self-pay | Admitting: Gastroenterology

## 2015-02-01 VITALS — BP 105/60 | HR 91 | Ht 67.0 in | Wt 175.0 lb

## 2015-02-01 DIAGNOSIS — R1013 Epigastric pain: Secondary | ICD-10-CM | POA: Diagnosis not present

## 2015-02-01 DIAGNOSIS — M797 Fibromyalgia: Secondary | ICD-10-CM | POA: Diagnosis not present

## 2015-02-01 DIAGNOSIS — Z8619 Personal history of other infectious and parasitic diseases: Secondary | ICD-10-CM | POA: Insufficient documentation

## 2015-02-01 DIAGNOSIS — M7532 Calcific tendinitis of left shoulder: Secondary | ICD-10-CM | POA: Diagnosis not present

## 2015-02-01 DIAGNOSIS — R140 Abdominal distension (gaseous): Secondary | ICD-10-CM

## 2015-02-01 DIAGNOSIS — S30861A Insect bite (nonvenomous) of abdominal wall, initial encounter: Secondary | ICD-10-CM | POA: Diagnosis not present

## 2015-02-01 DIAGNOSIS — M25561 Pain in right knee: Secondary | ICD-10-CM | POA: Diagnosis not present

## 2015-02-01 MED ORDER — GLYCOPYRROLATE 2 MG PO TABS: 2.0000 mg | ORAL_TABLET | Freq: Two times a day (BID) | ORAL | Status: DC

## 2015-02-01 NOTE — Progress Notes (Signed)
     02/01/2015 [place] [id number] 05-Apr-1946   History of Present Illness:  This is a 68 year old male who has been seen by Dr. Carlean Purl over the years for functional complaints including globus sensation, abdominal pain, and bloating.  He did have H. Pylori gastritis seen on biopsies from EGD treated in 2014.  He is status post a fundoplication procedure in the past.  He also has a diagnosis of IBS, IC, anxiety and depression, somatization disorder, and fibromyalgia.  He presents to our office today with complaints of upper abdominal pain and bloating.  He is not a great historian, but says that this is the same pain and complaints that he's been experiencing but just more consistent now.  He says that the pain is particularly worse when he takes his lorazepam or his tramadol.  Pain seems to be better when he eats.  Says that he has been eating non-stop and has gained weight (according to our scale he has only gained 3 pounds since his last appt in November).  It appears that he was previously on dicyclomine but is not taking it currently and does not recall if it helped in the past or not.  He also complains a lot about medications only working for him for a week or two before they stop being effective.   Current Medications, Allergies, Past Medical History, Past Surgical History, Family History and Social History were reviewed in [employer] record.   Physical Exam: BP 105/60 mmHg  Pulse 91  Ht 5\' 7"  (1.702 m)  Wt 175 lb (79.379 kg)  BMI 27.40 kg/m2  SpO2 94% General: Well developed white male in no acute distress Head: Normocephalic and atraumatic Eyes:  Sclerae anicteric, conjunctiva pink  Ears: Normal auditory acuity Lungs: Clear throughout to auscultation Heart: Regular rate and rhythm Abdomen: Soft, non-distended.  Normal bowel sounds.  Mild epigastric TTP without R/R/G. Musculoskeletal: Symmetrical with no gross deformities  Extremities: No  edema  Neurological: Alert oriented x 4, grossly non-focal Psychological:  Alert and cooperative. Normal mood and affect  Assessment and Recommendations: -Epigastric abdominal pain with bloating:  Symptoms similar to past complaints, but more persistent.  Suspected to have IBS with gas-bloat syndrome in the setting of anxiety/depression, interstitial cystitis, and fibromyalgia.   -History of treatment for Hpylori infection  *Discussed with Dr. Carlean Purl.  Will check stool for Hpylori Ag.  Will try Robinul 2 mg BID.  May want to consider trial of duloxetine to treat anxiety/depression as well as pain.

## 2015-02-01 NOTE — Patient Instructions (Signed)
Please go to the basement level to our lab for a stool study.  We sent the prescription to [facility], [address], [place], [place].  1. Glycopyrolate ( Robinul Forte )   The lab you can have drawn at your primary care phsicians is: Hepatic Function panel.

## 2015-02-02 ENCOUNTER — Other Ambulatory Visit: Payer: Medicare Other

## 2015-02-02 ENCOUNTER — Other Ambulatory Visit (INDEPENDENT_AMBULATORY_CARE_PROVIDER_SITE_OTHER): Payer: Medicare Other

## 2015-02-02 ENCOUNTER — Encounter: Payer: Self-pay | Admitting: Internal Medicine

## 2015-02-02 ENCOUNTER — Ambulatory Visit (INDEPENDENT_AMBULATORY_CARE_PROVIDER_SITE_OTHER): Payer: Medicare Other | Admitting: Internal Medicine

## 2015-02-02 VITALS — BP 120/76 | HR 84 | Temp 98.5°F | Resp 18 | Ht 67.0 in | Wt 173.1 lb

## 2015-02-02 DIAGNOSIS — R7989 Other specified abnormal findings of blood chemistry: Secondary | ICD-10-CM | POA: Diagnosis not present

## 2015-02-02 DIAGNOSIS — M2550 Pain in unspecified joint: Secondary | ICD-10-CM | POA: Diagnosis not present

## 2015-02-02 DIAGNOSIS — I10 Essential (primary) hypertension: Secondary | ICD-10-CM

## 2015-02-02 DIAGNOSIS — Z0000 Encounter for general adult medical examination without abnormal findings: Secondary | ICD-10-CM

## 2015-02-02 DIAGNOSIS — R140 Abdominal distension (gaseous): Secondary | ICD-10-CM

## 2015-02-02 DIAGNOSIS — R1013 Epigastric pain: Secondary | ICD-10-CM

## 2015-02-02 DIAGNOSIS — E785 Hyperlipidemia, unspecified: Secondary | ICD-10-CM | POA: Diagnosis not present

## 2015-02-02 LAB — PSA: PSA: 1.14 ng/mL (ref 0.10–4.00)

## 2015-02-02 LAB — HEPATIC FUNCTION PANEL
ALT: 19 U/L (ref 0–53)
AST: 17 U/L (ref 0–37)
Albumin: 4.3 g/dL (ref 3.5–5.2)
Alkaline Phosphatase: 80 U/L (ref 39–117)
Bilirubin, Direct: 0 mg/dL (ref 0.0–0.3)
Total Bilirubin: 0.4 mg/dL (ref 0.2–1.2)
Total Protein: 7.1 g/dL (ref 6.0–8.3)

## 2015-02-02 LAB — CBC WITH DIFFERENTIAL/PLATELET
Basophils Absolute: 0.1 10*3/uL (ref 0.0–0.1)
Basophils Relative: 0.7 % (ref 0.0–3.0)
Eosinophils Absolute: 0.1 10*3/uL (ref 0.0–0.7)
Eosinophils Relative: 1.1 % (ref 0.0–5.0)
HCT: 45 % (ref 39.0–52.0)
Hemoglobin: 15.5 g/dL (ref 13.0–17.0)
Lymphocytes Relative: 31 % (ref 12.0–46.0)
Lymphs Abs: 3 10*3/uL (ref 0.7–4.0)
MCHC: 34.5 g/dL (ref 30.0–36.0)
MCV: 89.8 fl (ref 78.0–100.0)
Monocytes Absolute: 1 10*3/uL (ref 0.1–1.0)
Monocytes Relative: 10.9 % (ref 3.0–12.0)
Neutro Abs: 5.4 10*3/uL (ref 1.4–7.7)
Neutrophils Relative %: 56.3 % (ref 43.0–77.0)
Platelets: 284 10*3/uL (ref 150.0–400.0)
RBC: 5.01 Mil/uL (ref 4.22–5.81)
RDW: 12.9 % (ref 11.5–15.5)
WBC: 9.6 10*3/uL (ref 4.0–10.5)

## 2015-02-02 LAB — URINALYSIS, ROUTINE W REFLEX MICROSCOPIC
Bilirubin Urine: NEGATIVE
Ketones, ur: NEGATIVE
Leukocytes, UA: NEGATIVE
Nitrite: NEGATIVE
Specific Gravity, Urine: >=1.03 — AB (ref 1.000–1.030)
Total Protein, Urine: NEGATIVE
Urine Glucose: NEGATIVE
Urobilinogen, UA: 0.2 (ref 0.0–1.0)
pH: 6 (ref 5.0–8.0)

## 2015-02-02 LAB — TSH: TSH: 2.25 u[IU]/mL (ref 0.35–4.50)

## 2015-02-02 LAB — BASIC METABOLIC PANEL
BUN: 20 mg/dL (ref 6–23)
CO2: 29 mEq/L (ref 19–32)
Calcium: 9.8 mg/dL (ref 8.4–10.5)
Chloride: 103 mEq/L (ref 96–112)
Creatinine, Ser: 1.21 mg/dL (ref 0.40–1.50)
GFR: 63.4 mL/min (ref >60.00)
Glucose, Bld: 94 mg/dL (ref 70–99)
Potassium: 4.3 mEq/L (ref 3.5–5.1)
Sodium: 137 mEq/L (ref 135–145)

## 2015-02-02 LAB — LIPID PANEL
Cholesterol: 242 mg/dL — ABNORMAL HIGH (ref 0–200)
HDL: 39.6 mg/dL (ref >39.00)
Total CHOL/HDL Ratio: 6
Triglycerides: 486 mg/dL — ABNORMAL HIGH (ref 0.0–149.0)

## 2015-02-02 LAB — LDL CHOLESTEROL, DIRECT: Direct LDL: 160 mg/dL

## 2015-02-02 LAB — RHEUMATOID FACTOR: Rhuematoid fact SerPl-aCnc: <10 IU/mL (ref <=14)

## 2015-02-02 NOTE — Assessment & Plan Note (Signed)

## 2015-02-02 NOTE — Assessment & Plan Note (Signed)
Diff includes PMR, RA, even lupus, vs djd with pain exac by anxiety/somatization; for RF, esr, anti-ds dna

## 2015-02-02 NOTE — Progress Notes (Signed)
Subjective:    Patient ID: Erik Lolling Sr., male    DOB: 04/21/1946, 69 y.o.   MRN: [id number]  HPI   Here for wellness and f/u;  Overall doing ok;  Pt denies Chest pain, worsening SOB, DOE, wheezing, orthopnea, PND, worsening LE edema, palpitations, dizziness or syncope.  Pt denies neurological change such as new headache, facial or extremity weakness.  Pt denies polydipsia, polyuria, or low sugar symptoms. Pt states overall good compliance with treatment and medications, good tolerability, and has been trying to follow appropriate diet.  Pt denies worsening depressive symptoms, suicidal ideation or panic. No fever, night sweats, wt loss, loss of appetite, or other constitutional symptoms.  Pt states good ability with ADL's, has low fall risk, home safety reviewed and adequate, no other significant changes in hearing or vision, and only occasionally active with exercise. C/o pain to bilat shoulder, knees, ankles and wrsits for unclear reason for the past 2-3 wks, no swelling, jan 2016 esr normal  Seen by [place] who suggested check RF.  Has known left shoulder parital rot cuff tear per ortho and MRI last wk - still deciding on next step, per pt has been an issue for 2 yrs.  Also with recent abd pain, epi - declines all nesiads including asa.  Has RA listed on his history, but pt denies RA diagnosis Past Medical History  Diagnosis Date  . Allergic rhinitis   . Rheumatoid arthritis(714.0)   . Insomnia   . Anxiety   . CAD (coronary artery disease)   . Diverticulitis   . GERD with stricture   . HLD (hyperlipidemia)   . HTN (hypertension)   . Interstitial cystitis   . IBS (irritable bowel syndrome)   . Anal fissure   . Depression   . Hiatal hernia   . Internal and external hemorrhoids without complication   . FIBROMYALGIA 06/09/2010    Qualifier: Diagnosis of  By: Carlean Purl MD, Dimas Millin Irritable bowel syndrome 06/09/2010    Qualifier: Diagnosis of  By: Carlean Purl MD, FACG, [place] CYSTITIS 06/09/2010    Qualifier: Diagnosis of  By: Carlean Purl MD, Dimas Millin Diverticulosis 12/18/2011  . 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 Helicobacter pylori (H. pylori) infection 11/25/2012    11/2012 EGD + gastric bxs  . Impotence of organic origin   . Coronary atherosclerosis of native coronary artery 2007    nonobstructive CAD by cath with 40% mid-LAD stenosis  . Hyperlipidemia   . Fibromyalgia   . Reactive hypoglycemia    Past Surgical History  Procedure Laterality Date  . Nissen fundoplication  6222  . Tonsillectomy and adenoidectomy    . Vasectomy    . Knee arthroscopy      right x 2  . Transurethral resection of prostate      x 2  . Bladder distention      x 6  . Shoulder arthroscopy  2010    left  . Colonoscopy  05/10/2006    diverticulosis, internal and external hemorrhoids  . Upper gastrointestinal endoscopy  05/13/2008    hiatal hernia  . Appendectomy  1957    reports that he has quit smoking. He has never used smokeless tobacco. He reports that he does not drink alcohol or use illicit drugs. family history includes Breast cancer in his sister; Colon polyps in his father; Diabetes in his father, other, and sister; Heart disease in his father and  mother; Hypertension in his father and mother; Stroke in his mother. There is no history of Colon cancer. Allergies  Allergen Reactions  . Pneumovax [Pneumococcal Polysaccharide Vaccine] Other (See Comments)    pain  . Prilosec [Omeprazole] Other (See Comments)    Per patient joint pain with PPI's  . Statins Other (See Comments)    Joint pain   Current Outpatient Prescriptions on File Prior to Visit  Medication Sig Dispense Refill  . glycopyrrolate (ROBINUL) 2 MG tablet Take 1 tablet (2 mg total) by mouth 2 (two) times daily. 60 tablet 0  . LORazepam (ATIVAN) 0.5 MG tablet Take 0.5 mg by mouth as needed.    Marland Kitchen LYRICA 50 MG capsule   0  . traMADol (ULTRAM) 50 MG tablet Take 50 mg by mouth as needed.      No current facility-administered medications on file prior to visit.    Review of Systems Constitutional: Negative for increased diaphoresis, other activity, appetite or siginficant weight change other than noted HENT: Negative for worsening hearing loss, ear pain, facial swelling, mouth sores and neck stiffness.   Eyes: Negative for other worsening pain, redness or visual disturbance.  Respiratory: Negative for shortness of breath and wheezing  Cardiovascular: Negative for chest pain and palpitations.  Gastrointestinal: Negative for diarrhea, blood in stool, abdominal distention or other pain Genitourinary: Negative for hematuria, flank pain or change in urine volume.  Musculoskeletal: Negative for myalgias or other joint complaints.  Skin: Negative for color change and wound or drainage.  Neurological: Negative for syncope and numbness. other than noted Hematological: Negative for adenopathy. or other swelling Psychiatric/Behavioral: Negative for hallucinations, SI, self-injury, decreased concentration or other worsening agitation.      Objective:   Physical Exam BP 120/76 mmHg  Pulse 84  Temp(Src) 98.5 F (36.9 C) (Oral)  Resp 18  Ht _0  (1.702 m)  Wt 173 lb 1 oz (78.5 kg)  BMI 27.10 kg/m2  SpO2 97% VS noted,  Constitutional: Pt is oriented to person, place, and time. Appears well-developed and well-nourished, in no significant distress Head: Normocephalic and atraumatic.  Right Ear: External ear normal.  Left Ear: External ear normal.  Nose: Nose normal.  Mouth/Throat: Oropharynx is clear and moist.  Eyes: Conjunctivae and EOM are normal. Pupils are equal, round, and reactive to light.  Neck: Normal range of motion. Neck supple. No JVD present. No tracheal deviation present or significant neck LA or mass Cardiovascular: Normal rate, regular rhythm, normal heart sounds and intact distal pulses.   Pulmonary/Chest: Effort normal and breath sounds without rales or  wheezing  Abdominal: Soft. Bowel sounds are normal. NR except epigastric tender No HSM  Musculoskeletal: Normal range of motion. Exhibits no edema.  Lymphadenopathy:  Has no cervical adenopathy.  Neurological: Pt is alert and oriented to person, place, and time. Pt has normal reflexes. No cranial nerve deficit. Motor grossly intact Skin: Skin is warm and dry. No rash noted.  Psychiatric:  Has nervous mood and affect. Behavior is normal. Spine nontender Has mult tender joints but no effusions to wrists, ankle, knees     Assessment & Plan:

## 2015-02-02 NOTE — Patient Instructions (Addendum)

## 2015-02-02 NOTE — Progress Notes (Signed)
Pre visit review using our clinic review tool, if applicable. No additional management support is needed unless otherwise documented below in the visit note. 

## 2015-02-03 LAB — ANTI-DNA ANTIBODY, DOUBLE-STRANDED: ds DNA Ab: 5 IU/mL — ABNORMAL HIGH

## 2015-02-03 LAB — SEDIMENTATION RATE: Sed Rate: 16 mm/hr (ref 0–22)

## 2015-02-03 NOTE — Progress Notes (Signed)
Agree with Ms. Zehr's management.  Dequon Schnebly E. Olyn Landstrom, MD, FACG  

## 2015-02-10 ENCOUNTER — Telehealth: Payer: Self-pay | Admitting: Internal Medicine

## 2015-02-10 NOTE — Telephone Encounter (Signed)
Pt called in not sure if he can get into mychart.  Would like to get his lab results

## 2015-02-11 NOTE — Telephone Encounter (Signed)
Called pt no answer LMOM with md response. Since pt having hard time with mychart mail out report...[name]

## 2015-02-16 LAB — H. PYLORI ANTIGEN, STOOL: H pylori Ag, Stl: NEGATIVE

## 2015-02-16 LAB — SPECIMEN STATUS REPORT

## 2015-02-17 ENCOUNTER — Telehealth: Payer: Self-pay | Admitting: Gastroenterology

## 2015-02-17 DIAGNOSIS — M75112 Incomplete rotator cuff tear or rupture of left shoulder, not specified as traumatic: Secondary | ICD-10-CM | POA: Diagnosis not present

## 2015-02-17 DIAGNOSIS — M25512 Pain in left shoulder: Secondary | ICD-10-CM | POA: Diagnosis not present

## 2015-02-17 DIAGNOSIS — M7542 Impingement syndrome of left shoulder: Secondary | ICD-10-CM | POA: Diagnosis not present

## 2015-02-17 DIAGNOSIS — M7502 Adhesive capsulitis of left shoulder: Secondary | ICD-10-CM | POA: Diagnosis not present

## 2015-02-17 NOTE — Telephone Encounter (Signed)
Patient given negative result. He states he is still having pain and a growling sensation in stomach.

## 2015-02-18 ENCOUNTER — Other Ambulatory Visit: Payer: Self-pay | Admitting: Internal Medicine

## 2015-02-18 DIAGNOSIS — K30 Functional dyspepsia: Secondary | ICD-10-CM

## 2015-02-18 MED ORDER — DULOXETINE HCL 30 MG PO CPEP: 60.0000 mg | ORAL_CAPSULE | Freq: Every day | ORAL | Status: DC

## 2015-02-18 NOTE — Telephone Encounter (Signed)
I called him Will try duloxetine for functional dyspepsia sxs - rx sent He is to call and make an appointment to see me in about June (call soon) If doing well he could call and tell me and not be seen

## 2015-03-02 DIAGNOSIS — N390 Urinary tract infection, site not specified: Secondary | ICD-10-CM | POA: Diagnosis not present

## 2015-03-03 DIAGNOSIS — N390 Urinary tract infection, site not specified: Secondary | ICD-10-CM | POA: Diagnosis not present

## 2015-03-10 DIAGNOSIS — M7542 Impingement syndrome of left shoulder: Secondary | ICD-10-CM | POA: Diagnosis not present

## 2015-03-15 DIAGNOSIS — M7542 Impingement syndrome of left shoulder: Secondary | ICD-10-CM | POA: Diagnosis not present

## 2015-03-18 DIAGNOSIS — N390 Urinary tract infection, site not specified: Secondary | ICD-10-CM | POA: Diagnosis not present

## 2015-03-21 ENCOUNTER — Emergency Department (HOSPITAL_COMMUNITY)
Admission: EM | Admit: 2015-03-21 | Discharge: 2015-03-21 | Disposition: A | Discharge status: Home / Self Care | Payer: Medicare Other | Attending: Emergency Medicine | Admitting: Emergency Medicine

## 2015-03-21 ENCOUNTER — Encounter (HOSPITAL_COMMUNITY): Payer: Self-pay | Admitting: Emergency Medicine

## 2015-03-21 DIAGNOSIS — R300 Dysuria: Secondary | ICD-10-CM | POA: Diagnosis not present

## 2015-03-21 DIAGNOSIS — F419 Anxiety disorder, unspecified: Secondary | ICD-10-CM | POA: Insufficient documentation

## 2015-03-21 DIAGNOSIS — Z79899 Other long term (current) drug therapy: Secondary | ICD-10-CM | POA: Insufficient documentation

## 2015-03-21 DIAGNOSIS — Z87448 Personal history of other diseases of urinary system: Secondary | ICD-10-CM | POA: Insufficient documentation

## 2015-03-21 DIAGNOSIS — I10 Essential (primary) hypertension: Secondary | ICD-10-CM | POA: Insufficient documentation

## 2015-03-21 DIAGNOSIS — Z8719 Personal history of other diseases of the digestive system: Secondary | ICD-10-CM | POA: Diagnosis not present

## 2015-03-21 DIAGNOSIS — F329 Major depressive disorder, single episode, unspecified: Secondary | ICD-10-CM | POA: Insufficient documentation

## 2015-03-21 DIAGNOSIS — Z87891 Personal history of nicotine dependence: Secondary | ICD-10-CM | POA: Diagnosis not present

## 2015-03-21 DIAGNOSIS — Z8669 Personal history of other diseases of the nervous system and sense organs: Secondary | ICD-10-CM | POA: Insufficient documentation

## 2015-03-21 DIAGNOSIS — M791 Myalgia, unspecified site: Secondary | ICD-10-CM

## 2015-03-21 DIAGNOSIS — Z8619 Personal history of other infectious and parasitic diseases: Secondary | ICD-10-CM | POA: Insufficient documentation

## 2015-03-21 DIAGNOSIS — N12 Tubulo-interstitial nephritis, not specified as acute or chronic: Secondary | ICD-10-CM | POA: Diagnosis not present

## 2015-03-21 DIAGNOSIS — Z8639 Personal history of other endocrine, nutritional and metabolic disease: Secondary | ICD-10-CM | POA: Diagnosis not present

## 2015-03-21 DIAGNOSIS — I2510 Atherosclerotic heart disease of native coronary artery without angina pectoris: Secondary | ICD-10-CM | POA: Diagnosis not present

## 2015-03-21 LAB — CBC WITH DIFFERENTIAL/PLATELET
Basophils Absolute: 0 10*3/uL (ref 0.0–0.1)
Basophils Relative: 0 % (ref 0–1)
Eosinophils Absolute: 0 10*3/uL (ref 0.0–0.7)
Eosinophils Relative: 0 % (ref 0–5)
HCT: 44.3 % (ref 39.0–52.0)
Hemoglobin: 14.8 g/dL (ref 13.0–17.0)
Lymphocytes Relative: 16 % (ref 12–46)
Lymphs Abs: 1 10*3/uL (ref 0.7–4.0)
MCH: 30.5 pg (ref 26.0–34.0)
MCHC: 33.4 g/dL (ref 30.0–36.0)
MCV: 91.3 fL (ref 78.0–100.0)
Monocytes Absolute: 0.8 10*3/uL (ref 0.1–1.0)
Monocytes Relative: 13 % — ABNORMAL HIGH (ref 3–12)
Neutro Abs: 4.5 10*3/uL (ref 1.7–7.7)
Neutrophils Relative %: 71 % (ref 43–77)
Platelets: 261 10*3/uL (ref 150–400)
RBC: 4.85 MIL/uL (ref 4.22–5.81)
RDW: 12.3 % (ref 11.5–15.5)
WBC: 6.3 10*3/uL (ref 4.0–10.5)

## 2015-03-21 LAB — BASIC METABOLIC PANEL
Anion gap: 5 (ref 5–15)
BUN: 14 mg/dL (ref 6–20)
CO2: 25 mmol/L (ref 22–32)
Calcium: 8.9 mg/dL (ref 8.9–10.3)
Chloride: 104 mmol/L (ref 101–111)
Creatinine, Ser: 1.17 mg/dL (ref 0.61–1.24)
GFR calc Af Amer: >60 mL/min (ref >60)
GFR calc non Af Amer: >60 mL/min (ref >60)
Glucose, Bld: 106 mg/dL — ABNORMAL HIGH (ref 70–99)
Potassium: 4.3 mmol/L (ref 3.5–5.1)
Sodium: 134 mmol/L — ABNORMAL LOW (ref 135–145)

## 2015-03-21 LAB — URINALYSIS, ROUTINE W REFLEX MICROSCOPIC
Bilirubin Urine: NEGATIVE
Glucose, UA: NEGATIVE mg/dL
Ketones, ur: NEGATIVE mg/dL
Nitrite: NEGATIVE
Protein, ur: NEGATIVE mg/dL
Specific Gravity, Urine: 1.004 — ABNORMAL LOW (ref 1.005–1.030)
Urobilinogen, UA: 0.2 mg/dL (ref 0.0–1.0)
pH: 6.5 (ref 5.0–8.0)

## 2015-03-21 LAB — I-STAT CG4 LACTIC ACID, ED: Lactic Acid, Venous: 0.77 mmol/L (ref 0.5–2.0)

## 2015-03-21 LAB — URINE MICROSCOPIC-ADD ON

## 2015-03-21 MED ORDER — DEXTROSE 5 % IV SOLN
1.0000 g | Freq: Once | INTRAVENOUS | Status: AC
  Administered 2015-03-21: 1 g via INTRAVENOUS
  Filled 2015-03-21: qty 10

## 2015-03-21 MED ORDER — METHOCARBAMOL 500 MG PO TABS: 500.0000 mg | ORAL_TABLET | Freq: Two times a day (BID) | ORAL | Status: DC

## 2015-03-21 MED ORDER — CEPHALEXIN 500 MG PO CAPS: 500.0000 mg | ORAL_CAPSULE | Freq: Two times a day (BID) | ORAL | Status: DC

## 2015-03-21 MED ORDER — SODIUM CHLORIDE 0.9 % IV BOLUS (SEPSIS)
500.0000 mL | Freq: Once | INTRAVENOUS | Status: AC
  Administered 2015-03-21: 500 mL via INTRAVENOUS

## 2015-03-21 NOTE — Discharge Instructions (Signed)

## 2015-03-21 NOTE — ED Notes (Signed)
Awake. Verbally responsive. A/O x4. Resp even and unlabored. No audible adventitious breath sounds noted. ABC's intact. Family at bedside. 

## 2015-03-21 NOTE — ED Notes (Signed)
Pt c/o dysuria, darkened urine with bad odor. Recently had UTI and completed abx course of Ciprofloxacin. 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 PCP alluded to patient being admitted. Denies fevers but has had chills. Denies vomiting but has had nausea. Still able to urinate. No other c/c.

## 2015-03-21 NOTE — ED Provider Notes (Signed)
CSN: [id number]     Arrival date & time 03/21/15  1139 History   First MD Initiated Contact with Patient 03/21/15 1211     Chief Complaint  Patient presents with  . Urinary Tract Infection     (Consider location/radiation/quality/duration/timing/severity/associated sxs/prior Treatment) HPI   Erik Lolling Sr. is a 69 y.o. male complaining of dysuria, concentrated urine, hematuria or foul-smelling urine with right gutters, diffuse myalgia. Symptoms started approximately 3 weeks ago. Patient completed a 10 day course of ciprofloxacin approximately 10 days ago, initially he had improved but symptoms have returned more severely. Patient denies flank pain, nausea, vomiting, change in bowel habits. He has interstitial cystitis and has to self cath intermittently for what he deems "Pee Freeze." No history of neurogenic bladder and states this is secondary to his spasms from his interstitial cystitis.  Urology:[facility]  Past Medical History  Diagnosis Date  . Allergic rhinitis   . Rheumatoid arthritis(714.0)   . Insomnia   . Anxiety   . CAD (coronary artery disease)   . Diverticulitis   . GERD with stricture   . HLD (hyperlipidemia)   . HTN (hypertension)   . Interstitial cystitis   . IBS (irritable bowel syndrome)   . Anal fissure   . Depression   . Hiatal hernia   . Internal and external hemorrhoids without complication   . FIBROMYALGIA 06/09/2010    Qualifier: Diagnosis of  By: Carlean Purl MD, Dimas Millin Irritable bowel syndrome 06/09/2010    Qualifier: Diagnosis of  By: Carlean Purl MD, FACG, [place] CYSTITIS 06/09/2010    Qualifier: Diagnosis of  By: Carlean Purl MD, Dimas Millin Diverticulosis 12/18/2011  . 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 Helicobacter pylori (H. pylori) infection 11/25/2012    11/2012 EGD + gastric bxs  . Impotence of organic origin   . Coronary atherosclerosis of native coronary artery 2007    nonobstructive CAD by cath with 40% mid-LAD stenosis  .  Hyperlipidemia   . Fibromyalgia   . Reactive hypoglycemia    Past Surgical History  Procedure Laterality Date  . Nissen fundoplication  4098  . Tonsillectomy and adenoidectomy    . Vasectomy    . Knee arthroscopy      right x 2  . Transurethral resection of prostate      x 2  . Bladder distention      x 6  . Shoulder arthroscopy  2010    left  . Colonoscopy  05/10/2006    diverticulosis, internal and external hemorrhoids  . Upper gastrointestinal endoscopy  05/13/2008    hiatal hernia  . Appendectomy  1957   Family History  Problem Relation Age of Onset  . Breast cancer Sister   . Colon polyps Father   . Diabetes Father   . Heart disease Mother   . Diabetes Sister   . Heart disease Father   . Colon cancer Neg Hx   . Hypertension Mother   . Hypertension Father   . Diabetes Other     uncle  . Stroke Mother    History  Substance Use Topics  . Smoking status: Former Research scientist (life sciences)  . Smokeless tobacco: Never Used     Comment: quit 1980  . Alcohol Use: No    Review of Systems  10 systems reviewed and found to be negative, except as noted in the HPI.   Allergies  Pneumovax; Prilosec; and Statins  Home Medications   Prior  to Admission medications   Medication Sig Start Date End Date Taking? Authorizing Provider  acetaminophen (TYLENOL) 500 MG tablet Take 1,000 mg by mouth every 6 (six) hours as needed for moderate pain or headache.   Yes Historical Provider, MD  DULoxetine (CYMBALTA) 30 MG capsule Take 2 capsules (60 mg total) by mouth daily with supper. Take only 1 capsule for first week 02/18/15  Yes Gatha Mayer, MD  glycopyrrolate (ROBINUL) 2 MG tablet Take 1 tablet (2 mg total) by mouth 2 (two) times daily. 02/01/15  Yes Jessica D Zehr, PA-C  LORazepam (ATIVAN) 0.5 MG tablet Take 0.5 mg by mouth daily as needed for anxiety.    Yes Historical Provider, MD  pentosan polysulfate (ELMIRON) 100 MG capsule Take 100 mg by mouth. Instillation.   Yes Historical Provider, MD   PRESCRIPTION MEDICATION Take 15 mLs by mouth. Marcaine--Installation,   Yes Historical Provider, MD  traMADol (ULTRAM) 50 MG tablet Take 50 mg by mouth every 6 (six) hours as needed for moderate pain.    Yes Historical Provider, MD  cephALEXin (KEFLEX) 500 MG capsule Take 1 capsule (500 mg total) by mouth 2 (two) times daily. 03/21/15   Elyssia Strausser, PA-C  methocarbamol (ROBAXIN) 500 MG tablet Take 1 tablet (500 mg total) by mouth 2 (two) times daily. 03/21/15   Dallen Bunte, PA-C   BP 146/73 mmHg  Pulse 83  Temp(Src) 98.4 F (36.9 C) (Oral)  Resp 17  Ht 5' 7.5" (1.715 m)  Wt 170 lb (77.111 kg)  BMI 26.22 kg/m2  SpO2 97% Physical Exam  Constitutional: He is oriented to person, place, and time. He appears well-developed and well-nourished. No distress.  HENT:  Head: Normocephalic and atraumatic.  Mouth/Throat: Oropharynx is clear and moist.  Eyes: Conjunctivae and EOM are normal. Pupils are equal, round, and reactive to light.  Neck: Normal range of motion.  Cardiovascular: Normal rate, regular rhythm and intact distal pulses.   Pulmonary/Chest: Effort normal and breath sounds normal. No stridor. No respiratory distress. He has no wheezes. He has no rales. He exhibits no tenderness.  Abdominal: Soft. Bowel sounds are normal. He exhibits no distension and no mass. There is no tenderness. There is no rebound and no guarding.  Genitourinary:  CVA tenderness to palpation bilaterally  Musculoskeletal: Normal range of motion.  Neurological: He is alert and oriented to person, place, and time.  Psychiatric: He has a normal mood and affect.  Nursing note and vitals reviewed.   ED Course  Procedures (including critical care time) Labs Review Labs Reviewed  URINALYSIS, ROUTINE W REFLEX MICROSCOPIC - Abnormal; Notable for the following:    Specific Gravity, Urine 1.004 (*)    Hgb urine dipstick SMALL (*)    Leukocytes, UA SMALL (*)    All other components within normal limits  CBC  WITH DIFFERENTIAL/PLATELET - Abnormal; Notable for the following:    Monocytes Relative 13 (*)    All other components within normal limits  BASIC METABOLIC PANEL - Abnormal; Notable for the following:    Sodium 134 (*)    Glucose, Bld 106 (*)    All other components within normal limits  URINE CULTURE  URINE MICROSCOPIC-ADD ON  I-STAT CG4 LACTIC ACID, ED  I-STAT CG4 LACTIC ACID, ED    Imaging Review No results found.   EKG Interpretation None      MDM   Final diagnoses:  Dysuria  Myalgia    Filed Vitals:   03/21/15 1154 03/21/15 1400  BP:  135/84 146/73  Pulse: 85 83  Temp: 98.4 F (36.9 C)   TempSrc: Oral   Resp: 17   Height: 5' 7.5" (1.715 m)   Weight: 170 lb (77.111 kg)   SpO2: 98% 97%    Medications  sodium chloride 0.9 % bolus 500 mL (0 mLs Intravenous Stopped 03/21/15 1430)  cefTRIAXone (ROCEPHIN) 1 g in dextrose 5 % 50 mL IVPB (0 g Intravenous Stopped 03/21/15 1427)    Erik Lolling Sr. is a pleasant 69 y.o. male presenting with concentrated urine with dysuria and urinary frequency. Patient has to self cath intermittently secondary to issues with his interstitial cystitis. Patient is also complaining of myalgia, flank pain chills or riders. Patient completed a ten-day course of Cipro 10 days ago he had initial improvement but has regressed. Patient has no CVA tenderness palpation bilaterally. Blood work with no significant abnormality, there is a small amount of hemoglobin and small leukocytes and this clean urine sample with 3-6 white blood cells and rare bacteria. I'm not convinced that this is an infection however considering he is self cathing I will treat him with Rocephin and Keflex out of an abundance of caution. Patient is concerned about the severity of his pain, explained to him that he will need to follow with his primary care physician for further management of this and will write him for Robaxin in the ED. I've also advised him to follow with his  urologist at [facility]. Patient verbalizes understanding.  Evaluation does not show pathology that would require ongoing emergent intervention or inpatient treatment. Pt is hemodynamically stable and mentating appropriately. Discussed findings and plan with patient/guardian, who agrees with care plan. All questions answered. Return precautions discussed and outpatient follow up given.   Discharge Medication List as of 03/21/2015  2:19 PM    START taking these medications   Details  cephALEXin (KEFLEX) 500 MG capsule Take 1 capsule (500 mg total) by mouth 2 (two) times daily., Starting 03/21/2015, Until Discontinued, Print    methocarbamol (ROBAXIN) 500 MG tablet Take 1 tablet (500 mg total) by mouth 2 (two) times daily., Starting 03/21/2015, Until Discontinued, Print             Monico Blitz, PA-C 03/21/15 F[place], DO 03/23/15 1304

## 2015-03-21 NOTE — ED Notes (Signed)
Pt, being sent by PCP, c/o flank pain and chills.  Recent UTI and completed Cipro regimen w/o relief.

## 2015-03-21 NOTE — ED Notes (Signed)
Awake. Verbally responsive. A/O x4. Resp even and unlabored. No audible adventitious breath sounds noted. ABC's intact.  

## 2015-03-22 LAB — URINE CULTURE
Colony Count: NO GROWTH
Culture: NO GROWTH

## 2015-03-24 ENCOUNTER — Telehealth: Payer: Self-pay | Admitting: Internal Medicine

## 2015-03-24 NOTE — Telephone Encounter (Signed)
Kermit Balo from [facility] called to let us know that pt has appointment tomorrow 5/13 with Dr. Jenny Reichmann for ER follow up and he is experiencing stomach pains and diarrhea. He was prescribed cephALEXin (KEFLEX) 500 MG capsule [[id number] and methocarbamol (ROBAXIN) 500 MG tablet [[id number]] at his ER visit and he is wondering if the meds are causing these symptoms and if he should continue to take them. She just wanted you to be aware of this for his appointment tomorrow.

## 2015-03-24 NOTE — Telephone Encounter (Signed)
Difficult to say, but as any antibiotic can potentially cause diarrhea, we should advise to stop  However, he may need to be seen in [place] for the oriiginal problem for which he was rx the cephalexin  Please consider OV

## 2015-03-24 NOTE — Telephone Encounter (Signed)
PLEASE NOTE: All timestamps contained within this report are represented as [place] Standard Time. CONFIDENTIALTY NOTICE: This fax transmission is intended only for the addressee. It contains information that is legally privileged, confidential or otherwise protected from use or disclosure. If you are not the intended recipient, you are strictly prohibited from reviewing, disclosing, copying using or disseminating any of this information or taking any action in reliance on or regarding this information. If you have received this fax in error, please notify us immediately by telephone so that we can arrange for its return to [name]. Phone: [phone], Toll-Free: [phone], Fax: [phone] Page: 1 of 1 Call Id: [id number] [facility] - [facility] [place] Patient Name: MALIKE FOGLIO DOB: September 14, 1946 Initial Comment Caller states, has problems with Rx given to him by the ER 2 days ago. He has diarrhea, feels hot, sweaty, bowel pain. Nurse Assessment Nurse: Marcelline Deist, RN, Lynda Date/Time (Eastern Time): 03/24/2015 10:27:58 AM Confirm and document reason for call. If symptomatic, describe symptoms. ---Caller states has problems with Rx (Methocarbamol & Cephalexin) given to him by the ER 2 days ago. He has frequent diarrhea, feels hot, sweaty, bowel pain off & on. Has the patient traveled out of the country within the last 30 days? ---Not Applicable Does the patient require triage? ---Yes Related visit to physician within the last 2 weeks? ---Yes Does the PT have any chronic conditions? (i.e. diabetes, asthma, etc.) ---Yes List chronic conditions. ---interstitial cystitis, CAD, reactive hypoglycemia Guidelines Guideline Title Affirmed Question Affirmed Notes Diarrhea [1] Age > 60 years AND [2] > 6 diarrhea stools in past 24 hours Final Disposition User See Physician within 4 Hours (or PCP triage) Marcelline Deist, RN, Lynda Comments Caller  is wanting to know if he should not take his rxs. Nurse cannot advise. Will be in contact with office. No availability with his Dr. today on schedule, but states he has an appt. tomorrow afternoon scheduled. Feels too weak to go anywhere today. Spoke with staff at office to let them know that pt. was given a 4 hr. outcome & wants to know if he should continue to take rxs. Will send a note to his Dr.

## 2015-03-24 NOTE — Telephone Encounter (Signed)
He was rxed keflex and robaxin via ED visit ( 03/31/15 )

## 2015-03-24 NOTE — Telephone Encounter (Signed)
Previous msg form [facility] has already been forward to md for his advisement. Closing this encounter...[name]

## 2015-03-24 NOTE — Telephone Encounter (Signed)
Please advise if the pt should dc use of rx below.

## 2015-03-24 NOTE — Telephone Encounter (Signed)
Needs OV if not improved, or to ER if worse as far as fever, pain, weakness, dizzines, blood or other unusual symptoms  I cannot tell from this note which medication he concerned with

## 2015-03-25 ENCOUNTER — Encounter: Payer: Self-pay | Admitting: Internal Medicine

## 2015-03-25 ENCOUNTER — Ambulatory Visit (INDEPENDENT_AMBULATORY_CARE_PROVIDER_SITE_OTHER): Payer: Medicare Other | Admitting: Internal Medicine

## 2015-03-25 VITALS — BP 118/72 | HR 74 | Temp 98.0°F | Ht 67.5 in | Wt 172.5 lb

## 2015-03-25 DIAGNOSIS — R3989 Other symptoms and signs involving the genitourinary system: Secondary | ICD-10-CM

## 2015-03-25 DIAGNOSIS — M2550 Pain in unspecified joint: Secondary | ICD-10-CM

## 2015-03-25 DIAGNOSIS — N3010 Interstitial cystitis (chronic) without hematuria: Secondary | ICD-10-CM

## 2015-03-25 DIAGNOSIS — F419 Anxiety disorder, unspecified: Secondary | ICD-10-CM

## 2015-03-25 MED ORDER — TRAMADOL HCL 50 MG PO TABS: 50.0000 mg | ORAL_TABLET | Freq: Every day | ORAL | Status: DC | PRN

## 2015-03-25 MED ORDER — TRAMADOL HCL 50 MG PO TABS: 50.0000 mg | ORAL_TABLET | Freq: Four times a day (QID) | ORAL | Status: DC | PRN

## 2015-03-25 NOTE — Telephone Encounter (Signed)
Pt informed - he is scheduled with PCP today at 1:45.

## 2015-03-25 NOTE — Assessment & Plan Note (Signed)
Recent urine cx neg, no futher antibx needed, for urology referral

## 2015-03-25 NOTE — Patient Instructions (Addendum)
Please take all new medication as prescribed - the tramadol daily as needed  Please continue all other medications as before, and refills have been done if requested.  Please have the pharmacy call with any other refills you may need.  Please keep your appointments with your specialists as you may have planned  You will be contacted regarding the referral for: urology

## 2015-03-25 NOTE — Assessment & Plan Note (Signed)
stable overall by history and exam, recent data reviewed with pt, and pt to continue medical treatment as before,  to f/u any worsening symptoms or concerns Lab Results  Component Value Date   WBC 6.3 03/21/2015   HGB 14.8 03/21/2015   HCT 44.3 03/21/2015   PLT 261 03/21/2015   GLUCOSE 106* 03/21/2015   CHOL 242* 02/02/2015   TRIG * 02/02/2015    486.0 Triglyceride is over 400; calculations on Lipids are invalid.   HDL 39.60 02/02/2015   LDLDIRECT 160.0 02/02/2015   ALT 19 02/02/2015   AST 17 02/02/2015   NA 134* 03/21/2015   K 4.3 03/21/2015   CL 104 03/21/2015   CREATININE 1.17 03/21/2015   BUN 14 03/21/2015   CO2 25 03/21/2015   TSH 2.25 02/02/2015   PSA 1.14 02/02/2015

## 2015-03-25 NOTE — Progress Notes (Signed)
Pre visit review using our clinic review tool, if applicable. No additional management support is needed unless otherwise documented below in the visit note. 

## 2015-03-25 NOTE — Progress Notes (Signed)
Subjective:    Patient ID: Erik Lolling Sr., male    DOB: 29-Jul-1946, 69 y.o.   MRN: [id number]  HPI  Here after seen in ED after a course of cipro with good results but then with recurrence of severe bladder pain with chills and myalgias.  Urine cx negative from may 9. Pt had to stop the cephalexin due to diarrhea.  Still with some bladder pain,    Still with left shoulder pain and other intermittent joint pain starting to hurt again, had to stop PT due to incrased pain to shoulder, knees and wrist, could not get OOB, did not want to go back.  Has seen orthopedic and now for pain manegement.  Denies worsening depressive symptoms, suicidal ideation, or panic; has ongoing anxiety. Lorazepam helps Past Medical History  Diagnosis Date  . Allergic rhinitis   . Rheumatoid arthritis(714.0)   . Insomnia   . Anxiety   . CAD (coronary artery disease)   . Diverticulitis   . GERD with stricture   . HLD (hyperlipidemia)   . HTN (hypertension)   . Interstitial cystitis   . IBS (irritable bowel syndrome)   . Anal fissure   . Depression   . Hiatal hernia   . Internal and external hemorrhoids without complication   . FIBROMYALGIA 06/09/2010    Qualifier: Diagnosis of  By: Carlean Purl MD, Dimas Millin Irritable bowel syndrome 06/09/2010    Qualifier: Diagnosis of  By: Carlean Purl MD, FACG, [place] CYSTITIS 06/09/2010    Qualifier: Diagnosis of  By: Carlean Purl MD, Dimas Millin Diverticulosis 12/18/2011  . 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 Helicobacter pylori (H. pylori) infection 11/25/2012    11/2012 EGD + gastric bxs  . Impotence of organic origin   . Coronary atherosclerosis of native coronary artery 2007    nonobstructive CAD by cath with 40% mid-LAD stenosis  . Hyperlipidemia   . Fibromyalgia   . Reactive hypoglycemia    Past Surgical History  Procedure Laterality Date  . Nissen fundoplication  0865  . Tonsillectomy and adenoidectomy    . Vasectomy    . Knee arthroscopy       right x 2  . Transurethral resection of prostate      x 2  . Bladder distention      x 6  . Shoulder arthroscopy  2010    left  . Colonoscopy  05/10/2006    diverticulosis, internal and external hemorrhoids  . Upper gastrointestinal endoscopy  05/13/2008    hiatal hernia  . Appendectomy  1957    reports that he has quit smoking. He has never used smokeless tobacco. He reports that he does not drink alcohol or use illicit drugs. family history includes Breast cancer in his sister; Colon polyps in his father; Diabetes in his father, other, and sister; Heart disease in his father and mother; Hypertension in his father and mother; Stroke in his mother. There is no history of Colon cancer. Allergies  Allergen Reactions  . Pneumovax [Pneumococcal Polysaccharide Vaccine] Other (See Comments)    pain  . Prilosec [Omeprazole] Other (See Comments)    Per patient joint pain with PPI's  . Statins Other (See Comments)    Joint pain   Current Outpatient Prescriptions on File Prior to Visit  Medication Sig Dispense Refill  . LORazepam (ATIVAN) 0.5 MG tablet Take 0.5 mg by mouth daily as needed for anxiety.     Marland Kitchen  pentosan polysulfate (ELMIRON) 100 MG capsule 100 mg by Intracatheter route. Instillation.    Marland Kitchen PRESCRIPTION MEDICATION 15 mLs by Intracatheter route. Marcaine--Installation,    . acetaminophen (TYLENOL) 500 MG tablet Take 1,000 mg by mouth every 6 (six) hours as needed for moderate pain or headache.     No current facility-administered medications on file prior to visit.   Review of Systems  Constitutional: Negative for unusual diaphoresis or night sweats HENT: Negative for ringing in ear or discharge Eyes: Negative for double vision or worsening visual disturbance.  Respiratory: Negative for choking and stridor.   Gastrointestinal: Negative for vomiting or other signifcant bowel change Genitourinary: Negative for hematuria or change in urine volume.  Musculoskeletal: Negative for  other MSK pain or swelling Skin: Negative for color change and worsening wound.  Neurological: Negative for tremors and numbness other than noted  Psychiatric/Behavioral: Negative for decreased concentration or agitation other than above       Objective:   Physical Exam BP 118/72 mmHg  Pulse 74  Temp(Src) 98 F (36.7 C) (Oral)  Ht 5' 7.5" (1.715 m)  Wt 172 lb 8 oz (78.245 kg)  BMI 26.60 kg/m2  SpO2 95% VS noted,  Constitutional: Pt appears in no significant distress HENT: Head: NCAT.  Right Ear: External ear normal.  Left Ear: External ear normal.  Eyes: . Pupils are equal, round, and reactive to light. Conjunctivae and EOM are normal Neck: Normal range of motion. Neck supple.  Cardiovascular: Normal rate and regular rhythm.   Pulmonary/Chest: Effort normal and breath sounds without rales or wheezing.  Abd:  Soft, NT, ND, + BS Neurological: Pt is alert. Not confused , motor grossly intact Skin: Skin is warm. No rash, no LE edema Psychiatric: Pt behavior is normal. No agitation.   Urine culture  Status: Finalresult Visible to patient:  MyChart Nextappt: None         4d ago    Specimen Description URINE, CLEAN CATCH   Special Requests NONE   Colony Count NO GROWTH  Performed at [facility]       Culture NO GROWTH  Performed at [facility]       Report Status 03/22/2015 FINAL   Resulting Agency [facility]      Specimen Collected: 03/21/15 12:41 PM Last Resulted: 03/22/15 1:59 PM              Assessment & Plan:

## 2015-03-25 NOTE — Assessment & Plan Note (Signed)
Recent esr normal, has seen ortho, ok for tramadol qd prn - done hardcopy

## 2015-03-31 DIAGNOSIS — N3010 Interstitial cystitis (chronic) without hematuria: Secondary | ICD-10-CM | POA: Diagnosis not present

## 2015-04-05 DIAGNOSIS — N3010 Interstitial cystitis (chronic) without hematuria: Secondary | ICD-10-CM | POA: Diagnosis not present

## 2015-04-12 DIAGNOSIS — N3010 Interstitial cystitis (chronic) without hematuria: Secondary | ICD-10-CM | POA: Diagnosis not present

## 2015-04-18 DIAGNOSIS — N3010 Interstitial cystitis (chronic) without hematuria: Secondary | ICD-10-CM | POA: Diagnosis not present

## 2015-04-18 DIAGNOSIS — R32 Unspecified urinary incontinence: Secondary | ICD-10-CM | POA: Diagnosis not present

## 2015-04-21 ENCOUNTER — Telehealth: Payer: Self-pay | Admitting: Internal Medicine

## 2015-04-21 MED ORDER — ZOLPIDEM TARTRATE 5 MG PO TABS
5.0000 mg | ORAL_TABLET | Freq: Every evening | ORAL | Status: DC | PRN
Start: 2015-04-21 — End: 2015-06-28

## 2015-04-21 NOTE — Telephone Encounter (Signed)
Faxed to [facility] per pt request

## 2015-04-21 NOTE — Telephone Encounter (Signed)
Patient stated that he is having some side affects from medication Dmso Bladder Insolation, please advise on what to do

## 2015-04-21 NOTE — Telephone Encounter (Signed)
.  Done hardcopy to [facility] - ok for trial [place]

## 2015-04-21 NOTE — Telephone Encounter (Signed)
Pt states treatments were ordered by his Urologist. Pt is experiencing insomnia and was advised to contact PCP for treatment, please advise.

## 2015-04-25 DIAGNOSIS — N3010 Interstitial cystitis (chronic) without hematuria: Secondary | ICD-10-CM | POA: Diagnosis not present

## 2015-04-29 DIAGNOSIS — E559 Vitamin D deficiency, unspecified: Secondary | ICD-10-CM | POA: Diagnosis not present

## 2015-04-29 DIAGNOSIS — I2510 Atherosclerotic heart disease of native coronary artery without angina pectoris: Secondary | ICD-10-CM | POA: Diagnosis not present

## 2015-04-29 DIAGNOSIS — R7309 Other abnormal glucose: Secondary | ICD-10-CM | POA: Diagnosis not present

## 2015-04-29 DIAGNOSIS — E785 Hyperlipidemia, unspecified: Secondary | ICD-10-CM | POA: Diagnosis not present

## 2015-04-29 DIAGNOSIS — R32 Unspecified urinary incontinence: Secondary | ICD-10-CM | POA: Diagnosis not present

## 2015-05-03 DIAGNOSIS — N3010 Interstitial cystitis (chronic) without hematuria: Secondary | ICD-10-CM | POA: Diagnosis not present

## 2015-05-05 DIAGNOSIS — M1711 Unilateral primary osteoarthritis, right knee: Secondary | ICD-10-CM | POA: Diagnosis not present

## 2015-05-05 DIAGNOSIS — M25512 Pain in left shoulder: Secondary | ICD-10-CM | POA: Diagnosis not present

## 2015-05-05 DIAGNOSIS — M7542 Impingement syndrome of left shoulder: Secondary | ICD-10-CM | POA: Diagnosis not present

## 2015-05-10 DIAGNOSIS — N3010 Interstitial cystitis (chronic) without hematuria: Secondary | ICD-10-CM | POA: Diagnosis not present

## 2015-05-24 ENCOUNTER — Ambulatory Visit: Payer: Self-pay | Admitting: Orthopedic Surgery

## 2015-05-31 DIAGNOSIS — N528 Other male erectile dysfunction: Secondary | ICD-10-CM | POA: Diagnosis not present

## 2015-05-31 DIAGNOSIS — F329 Major depressive disorder, single episode, unspecified: Secondary | ICD-10-CM | POA: Diagnosis not present

## 2015-05-31 DIAGNOSIS — R300 Dysuria: Secondary | ICD-10-CM | POA: Diagnosis not present

## 2015-05-31 DIAGNOSIS — N3010 Interstitial cystitis (chronic) without hematuria: Secondary | ICD-10-CM | POA: Diagnosis not present

## 2015-05-31 DIAGNOSIS — R6882 Decreased libido: Secondary | ICD-10-CM | POA: Diagnosis not present

## 2015-06-16 ENCOUNTER — Ambulatory Visit: Payer: Self-pay | Admitting: Orthopedic Surgery

## 2015-06-16 NOTE — H&P (Signed)
Erik Lolling Sr. is an 69 y.o. male.   Chief Complaint: L shoulder pain HPI: The patient is a 69 year old male who presents today for follow up of their shoulder. The patient is being followed for their left shoulder pain. They are year(s) out from when symptoms began. Symptoms reported today include: pain (constant), catching and popping, while the patient does not report symptoms of: pain with overhead motions. The patient feels that they are doing poorly. Current treatment includes: activity modification and pain medications. The following medication has been used for pain control: Ultram.  [name] follows up left shoulder and right knee. Left shoulder is worse than his right knee. He does have fibromyalgia and coronary artery disease.  Past Medical History  Diagnosis Date  . Allergic rhinitis   . Rheumatoid arthritis(714.0)   . Insomnia   . Anxiety   . CAD (coronary artery disease)   . Diverticulitis   . GERD with stricture   . HLD (hyperlipidemia)   . HTN (hypertension)   . Interstitial cystitis   . IBS (irritable bowel syndrome)   . Anal fissure   . Depression   . Hiatal hernia   . Internal and external hemorrhoids without complication   . FIBROMYALGIA 06/09/2010    Qualifier: Diagnosis of  By: Carlean Purl MD, Dimas Millin Irritable bowel syndrome 06/09/2010    Qualifier: Diagnosis of  By: Carlean Purl MD, FACG, [place] CYSTITIS 06/09/2010    Qualifier: Diagnosis of  By: Carlean Purl MD, Dimas Millin Diverticulosis 12/18/2011  . 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 Helicobacter pylori (H. pylori) infection 11/25/2012    11/2012 EGD + gastric bxs  . Impotence of organic origin   . Coronary atherosclerosis of native coronary artery 2007    nonobstructive CAD by cath with 40% mid-LAD stenosis  . Hyperlipidemia   . Fibromyalgia   . Reactive hypoglycemia     Past Surgical History  Procedure Laterality Date  . Nissen fundoplication  6734  . Tonsillectomy and  adenoidectomy    . Vasectomy    . Knee arthroscopy      right x 2  . Transurethral resection of prostate      x 2  . Bladder distention      x 6  . Shoulder arthroscopy  2010    left  . Colonoscopy  05/10/2006    diverticulosis, internal and external hemorrhoids  . Upper gastrointestinal endoscopy  05/13/2008    hiatal hernia  . Appendectomy  1957    Family History  Problem Relation Age of Onset  . Breast cancer Sister   . Colon polyps Father   . Diabetes Father   . Heart disease Mother   . Diabetes Sister   . Heart disease Father   . Colon cancer Neg Hx   . Hypertension Mother   . Hypertension Father   . Diabetes Other     uncle  . Stroke Mother    Social History:  reports that he has quit smoking. He has never used smokeless tobacco. He reports that he does not drink alcohol or use illicit drugs.  Allergies:  Allergies  Allergen Reactions  . Pneumovax [Pneumococcal Polysaccharide Vaccine] Other (See Comments)    pain  . Prilosec [Omeprazole] Other (See Comments)    Per patient joint pain with PPI's  . Statins Other (See Comments)    Joint pain     (Not in a hospital admission)  No results found for this or any previous visit (from the past 48 hour(s)). No results found.  Review of Systems  Constitutional: Negative.   HENT: Negative.   Eyes: Negative.   Respiratory: Negative.   Cardiovascular: Negative.   Gastrointestinal: Negative.   Genitourinary: Negative.   Musculoskeletal: Positive for joint pain.  Skin: Negative.   Neurological: Negative.   Psychiatric/Behavioral: Negative.     There were no vitals taken for this visit. Physical Exam  Constitutional: He is oriented to person, place, and time. He appears well-developed and well-nourished.  HENT:  Head: Normocephalic and atraumatic.  Eyes: Pupils are equal, round, and reactive to light.  Neck: Normal range of motion.  Cardiovascular: Normal rate.   Respiratory: Effort normal.  GI: Soft.   Musculoskeletal:  On exam, he has positive impingement sign, positive second impingement sign of the shoulder. Decreased internal rotation. Inspection of the shoulder revealed no ecchymosis, soft tissue swelling or deformity. On palpation, nontender over the [facility] and in the subacromial region. On range of motion the patient had full range of motion. Provocative signs indicated no impingement sign, no sulcus sign, and negative speed's test. Negative lift off. Sensory exam was intact and motor function was normal in the deltoid and the rotator cuff.  Neurological: He is alert and oriented to person, place, and time.  Skin: Skin is warm and dry.     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  We discussed options, continue conservative. He can't have cortisone that aggravates his IC. I discussed shoulder mini open rotator cuff repair as he has a delamination intra-substance tear of the rotator cuff. He has had a previous acromioplasty and distal clavicle resection apparently. A minimal bursitis. There is no significant bony impingement. We discussed proceeding with the mini open. I did discuss arthroscopic, although would recommend excision of the nonhealing tear. It does appear to be more significant than just 20% to me. I had a long discussion with the patient concerning the risks and benefits of a left rotator cuff repair, including bleeding, infection, prolonged postoperative recovery, which may require 3 to 5 months until maximum medical improvement. Overnight procedure with initiation of early passive range of motion within physical therapy. Avoid any active motion for the first six weeks. This is all in an effort to avoid recurrent tear of the rotator cuff and adhesive capsulitis. Return to work without use of the arm can be obtained following two weeks. However, driving will be a challenge. We also discussed the  possibility of requiring implants including bone anchors, as well as an Allograft patch graft if a massive rotator cuff tear is encountered. Removal of any bones for spurs as well as bursitis will be performed during the procedure and also any associated anesthetic complications as well.  Plan left shoulder mini-open RCR, SAD  Cedrica Brune M. PA-C for Dr. Tonita Cong 06/16/2015, 5:58 PM

## 2015-06-23 ENCOUNTER — Encounter (HOSPITAL_COMMUNITY): Admission: RE | Payer: Self-pay | Source: Ambulatory Visit

## 2015-06-23 ENCOUNTER — Ambulatory Visit (HOSPITAL_COMMUNITY): Admission: RE | Admit: 2015-06-23 | Payer: Medicare Other | Source: Ambulatory Visit | Admitting: Specialist

## 2015-06-23 SURGERY — SHOULDER ARTHROSCOPY WITH SUBACROMIAL DECOMPRESSION AND OPEN ROTATOR CUFF REPAIR, OPEN BICEPS TENDON REPAIR
Anesthesia: General | Site: Shoulder | Laterality: Left

## 2015-06-28 ENCOUNTER — Ambulatory Visit (INDEPENDENT_AMBULATORY_CARE_PROVIDER_SITE_OTHER): Payer: Medicare Other | Admitting: Internal Medicine

## 2015-06-28 ENCOUNTER — Encounter: Payer: Self-pay | Admitting: Internal Medicine

## 2015-06-28 VITALS — BP 114/74 | HR 77 | Temp 98.6°F | Ht 68.0 in | Wt 172.0 lb

## 2015-06-28 DIAGNOSIS — F329 Major depressive disorder, single episode, unspecified: Secondary | ICD-10-CM

## 2015-06-28 DIAGNOSIS — F419 Anxiety disorder, unspecified: Secondary | ICD-10-CM

## 2015-06-28 DIAGNOSIS — R079 Chest pain, unspecified: Secondary | ICD-10-CM

## 2015-06-28 DIAGNOSIS — Z0000 Encounter for general adult medical examination without abnormal findings: Secondary | ICD-10-CM

## 2015-06-28 DIAGNOSIS — F32A Depression, unspecified: Secondary | ICD-10-CM

## 2015-06-28 DIAGNOSIS — R5383 Other fatigue: Secondary | ICD-10-CM

## 2015-06-28 DIAGNOSIS — G4700 Insomnia, unspecified: Secondary | ICD-10-CM

## 2015-06-28 DIAGNOSIS — Z0189 Encounter for other specified special examinations: Secondary | ICD-10-CM

## 2015-06-28 MED ORDER — TRAZODONE HCL 50 MG PO TABS: 25.0000 mg | ORAL_TABLET | Freq: Every evening | ORAL | Status: DC | PRN

## 2015-06-28 MED ORDER — ESCITALOPRAM OXALATE 10 MG PO TABS: 10.0000 mg | ORAL_TABLET | Freq: Every day | ORAL | Status: DC

## 2015-06-28 NOTE — Assessment & Plan Note (Signed)
Also for cortisol level,  to f/u any worsening symptoms or concerns

## 2015-06-28 NOTE — Assessment & Plan Note (Signed)
Unocntrolled, for lexapro 10 qd, refer counseling

## 2015-06-28 NOTE — Assessment & Plan Note (Signed)
stable overall by history and exam, recent data reviewed with pt, and pt to continue medical treatment as before,  to f/u any worsening symptoms or concerns  Lab Results  Component Value Date   WBC 6.3 03/21/2015   HGB 14.8 03/21/2015   HCT 44.3 03/21/2015   PLT 261 03/21/2015   GLUCOSE 106* 03/21/2015   CHOL 242* 02/02/2015   TRIG * 02/02/2015    486.0 Triglyceride is over 400; calculations on Lipids are invalid.   HDL 39.60 02/02/2015   LDLDIRECT 160.0 02/02/2015   ALT 19 02/02/2015   AST 17 02/02/2015   NA 134* 03/21/2015   K 4.3 03/21/2015   CL 104 03/21/2015   CREATININE 1.17 03/21/2015   BUN 14 03/21/2015   CO2 25 03/21/2015   TSH 2.25 02/02/2015   PSA 1.14 02/02/2015

## 2015-06-28 NOTE — Assessment & Plan Note (Signed)
ambien no longer working well, for trazodone 50 qhs prn

## 2015-06-28 NOTE — Assessment & Plan Note (Signed)
C/w msk, educated, reassured,  to f/u any worsening symptoms or concerns

## 2015-06-28 NOTE — Progress Notes (Addendum)
Subjective:    Patient ID: Erik Lolling Sr., male    DOB: [birth year], 69 y.o.   MRN: [id number]  HPI  Here to f/u,, c/o insomnia, hard to get to sleep, [name] does help but also with icnresaed anxiety and panic attacks.  Overall some worse after DMSO tx recently, and computer says sleep problems can occur after this, for suprapubic placement next wk, Still having nocturia that interferes with sleep as well. States [name] causes his panic as well, so cannot take.  Also has recurrent intermittent mid low steral sharp tenderness without assoc symptoms, recent stress test mar 2016 neg for ischemia.  Denies worsening depressive symptoms, suicidal ideation, or panic; has ongoing anxiety, he is ok with counseling referral.  Current dose ativan not working as well as before. Asks for cortisol level as his research this can cause his problem Past Medical History  Diagnosis Date  . Allergic rhinitis   . Rheumatoid arthritis(714.0)   . Insomnia   . Anxiety   . CAD (coronary artery disease)   . Diverticulitis   . GERD with stricture   . HLD (hyperlipidemia)   . HTN (hypertension)   . Interstitial cystitis   . IBS (irritable bowel syndrome)   . Anal fissure   . Depression   . Hiatal hernia   . Internal and external hemorrhoids without complication   . FIBROMYALGIA 06/09/2010    Qualifier: Diagnosis of  By: [name], [name] Irritable bowel syndrome 06/09/2010    Qualifier: Diagnosis of  By: [name], FACG, [name] CYSTITIS 06/09/2010    Qualifier: Diagnosis of  By: [name], [name] Diverticulosis 12/18/2011  . 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 Helicobacter pylori (H. pylori) infection 11/25/2012    11/2012 EGD + gastric bxs  . Impotence of organic origin   . Coronary atherosclerosis of native coronary artery 2007    nonobstructive CAD by cath with 40% mid-LAD stenosis  . Hyperlipidemia   . Fibromyalgia   . Reactive hypoglycemia    Past Surgical History    Procedure Laterality Date  . Nissen fundoplication  0354  . Tonsillectomy and adenoidectomy    . Vasectomy    . Knee arthroscopy      right x 2  . Transurethral resection of prostate      x 2  . Bladder distention      x 6  . Shoulder arthroscopy  2010    left  . Colonoscopy  05/10/2006    diverticulosis, internal and external hemorrhoids  . Upper gastrointestinal endoscopy  05/13/2008    hiatal hernia  . Appendectomy  1957    reports that he has quit smoking. He has never used smokeless tobacco. He reports that he does not drink alcohol or use illicit drugs. family history includes Breast cancer in his sister; Colon polyps in his father; Diabetes in his father, other, and sister; Heart disease in his father and mother; Hypertension in his father and mother; Stroke in his mother. There is no history of Colon cancer. Allergies  Allergen Reactions  . Pneumovax [Pneumococcal Polysaccharide Vaccine] Other (See Comments)    pain  . Prilosec [Omeprazole] Other (See Comments)    Per patient joint pain with PPI's  . Statins Other (See Comments)    Joint pain   Current Outpatient Prescriptions on File Prior to Visit  Medication Sig Dispense Refill  . LORazepam (ATIVAN) 0.5 MG tablet Take  0.5 mg by mouth daily as needed for anxiety.     Marland Kitchen PRESCRIPTION MEDICATION 15 mLs by Intracatheter route. Marcaine--Installation,    . traMADol (ULTRAM) 50 MG tablet Take 1 tablet (50 mg total) by mouth daily as needed for moderate pain. 90 tablet 1  . zolpidem (AMBIEN) 5 MG tablet Take 1 tablet (5 mg total) by mouth at bedtime as needed for sleep. 30 tablet 1  . acetaminophen (TYLENOL) 500 MG tablet Take 1,000 mg by mouth every 6 (six) hours as needed for moderate pain or headache.    . pentosan polysulfate (ELMIRON) 100 MG capsule 100 mg by Intracatheter route. Instillation.     No current facility-administered medications on file prior to visit.   Review of Systems  Constitutional: Negative for  unusual diaphoresis or night sweats HENT: Negative for ringing in ear or discharge Eyes: Negative for double vision or worsening visual disturbance.  Respiratory: Negative for choking and stridor.   Gastrointestinal: Negative for vomiting or other signifcant bowel change Genitourinary: Negative for hematuria or change in urine volume.  Musculoskeletal: Negative for other MSK pain or swelling Skin: Negative for color change and worsening wound.  Neurological: Negative for tremors and numbness other than noted  Psychiatric/Behavioral: Negative for decreased concentration or agitation other than above       Objective:   Physical Exam BP 114/74 mmHg  Pulse 77  Temp(Src) 98.6 F (37 C) (Oral)  Ht 5\' 8"  (1.727 m)  Wt 172 lb (78.019 kg)  BMI 26.16 kg/m2  SpO2 95% VS noted, not ill apeparing Constitutional: Pt appears in no significant distress HENT: Head: NCAT.  Right Ear: External ear normal.  Left Ear: External ear normal.  Eyes: . Pupils are equal, round, and reactive to light. Conjunctivae and EOM are normal Neck: Normal range of motion. Neck supple.  Cardiovascular: Normal rate and regular rhythm.   Pulmonary/Chest: Effort normal and breath sounds without rales or wheezing.  Abd:  Soft, NT, ND, + BS, tender low mid sternal area without rash or swelling Neurological: Pt is alert. Not confused , motor grossly intact Skin: Skin is warm. No rash, no LE edema Psychiatric: Pt behavior is slight hyper, No agitation. 2+ nervous    Assessment & Plan:

## 2015-06-28 NOTE — Addendum Note (Signed)
Addended by: [name] on: 06/28/2015 04:34 PM   Modules accepted: Orders

## 2015-06-28 NOTE — Progress Notes (Signed)
Pre visit review using our clinic review tool, if applicable. No additional management support is needed unless otherwise documented below in the visit note. 

## 2015-06-28 NOTE — Patient Instructions (Addendum)
Please take all new medication as prescribed - the trazodone for sleep, and the lexapro for nerves  OK to stop the [employer] will be contacted regarding the referral for: psychology  Please continue all other medications as before, and refills have been done if requested.  Please have the pharmacy call with any other refills you may need.  Please keep your appointments with your specialists as you may have planned - the catheter placement next week  Please return in 6 months, or sooner if needed, with Lab testing done 3-5 days before

## 2015-06-30 ENCOUNTER — Other Ambulatory Visit (INDEPENDENT_AMBULATORY_CARE_PROVIDER_SITE_OTHER): Payer: Medicare Other

## 2015-06-30 DIAGNOSIS — R7989 Other specified abnormal findings of blood chemistry: Secondary | ICD-10-CM | POA: Diagnosis not present

## 2015-06-30 DIAGNOSIS — R5383 Other fatigue: Secondary | ICD-10-CM | POA: Diagnosis not present

## 2015-06-30 DIAGNOSIS — Z0189 Encounter for other specified special examinations: Secondary | ICD-10-CM

## 2015-06-30 DIAGNOSIS — E785 Hyperlipidemia, unspecified: Secondary | ICD-10-CM

## 2015-06-30 DIAGNOSIS — I10 Essential (primary) hypertension: Secondary | ICD-10-CM

## 2015-06-30 DIAGNOSIS — Z0000 Encounter for general adult medical examination without abnormal findings: Secondary | ICD-10-CM

## 2015-06-30 LAB — URINALYSIS, ROUTINE W REFLEX MICROSCOPIC
Bilirubin Urine: NEGATIVE
Ketones, ur: NEGATIVE
Leukocytes, UA: NEGATIVE
Nitrite: NEGATIVE
Specific Gravity, Urine: 1.025 (ref 1.000–1.030)
Total Protein, Urine: NEGATIVE
Urine Glucose: NEGATIVE
Urobilinogen, UA: 0.2 (ref 0.0–1.0)
pH: 6 (ref 5.0–8.0)

## 2015-06-30 LAB — CBC WITH DIFFERENTIAL/PLATELET
Basophils Absolute: 0 10*3/uL (ref 0.0–0.1)
Basophils Relative: 0.6 % (ref 0.0–3.0)
Eosinophils Absolute: 0.1 10*3/uL (ref 0.0–0.7)
Eosinophils Relative: 1.4 % (ref 0.0–5.0)
HCT: 46.7 % (ref 39.0–52.0)
Hemoglobin: 15.9 g/dL (ref 13.0–17.0)
Lymphocytes Relative: 40.6 % (ref 12.0–46.0)
Lymphs Abs: 3.4 10*3/uL (ref 0.7–4.0)
MCHC: 34.1 g/dL (ref 30.0–36.0)
MCV: 89.2 fl (ref 78.0–100.0)
Monocytes Absolute: 0.7 10*3/uL (ref 0.1–1.0)
Monocytes Relative: 8.9 % (ref 3.0–12.0)
Neutro Abs: 4 10*3/uL (ref 1.4–7.7)
Neutrophils Relative %: 48.5 % (ref 43.0–77.0)
Platelets: 292 10*3/uL (ref 150.0–400.0)
RBC: 5.24 Mil/uL (ref 4.22–5.81)
RDW: 13 % (ref 11.5–15.5)
WBC: 8.3 10*3/uL (ref 4.0–10.5)

## 2015-06-30 LAB — CORTISOL: Cortisol, Plasma: 10.1 ug/dL

## 2015-06-30 LAB — BASIC METABOLIC PANEL
BUN: 16 mg/dL (ref 6–23)
CO2: 29 mEq/L (ref 19–32)
Calcium: 9.6 mg/dL (ref 8.4–10.5)
Chloride: 104 mEq/L (ref 96–112)
Creatinine, Ser: 1.15 mg/dL (ref 0.40–1.50)
GFR: 67.16 mL/min (ref >60.00)
Glucose, Bld: 105 mg/dL — ABNORMAL HIGH (ref 70–99)
Potassium: 4.9 mEq/L (ref 3.5–5.1)
Sodium: 139 mEq/L (ref 135–145)

## 2015-06-30 LAB — HEPATIC FUNCTION PANEL
ALT: 20 U/L (ref 0–53)
AST: 19 U/L (ref 0–37)
Albumin: 4.3 g/dL (ref 3.5–5.2)
Alkaline Phosphatase: 66 U/L (ref 39–117)
Bilirubin, Direct: 0.1 mg/dL (ref 0.0–0.3)
Total Bilirubin: 0.7 mg/dL (ref 0.2–1.2)
Total Protein: 7.2 g/dL (ref 6.0–8.3)

## 2015-06-30 LAB — LIPID PANEL
Cholesterol: 221 mg/dL — ABNORMAL HIGH (ref 0–200)
HDL: 39.6 mg/dL (ref >39.00)
NonHDL: 181.87
Total CHOL/HDL Ratio: 6
Triglycerides: 208 mg/dL — ABNORMAL HIGH (ref 0.0–149.0)
VLDL: 41.6 mg/dL — ABNORMAL HIGH (ref 0.0–40.0)

## 2015-06-30 LAB — TSH: TSH: 2.59 u[IU]/mL (ref 0.35–4.50)

## 2015-06-30 LAB — PSA: PSA: 1.12 ng/mL (ref 0.10–4.00)

## 2015-06-30 LAB — LDL CHOLESTEROL, DIRECT: Direct LDL: 159 mg/dL

## 2015-07-01 ENCOUNTER — Telehealth: Payer: Self-pay | Admitting: Internal Medicine

## 2015-07-01 ENCOUNTER — Encounter: Payer: Self-pay | Admitting: Internal Medicine

## 2015-07-01 MED ORDER — QUETIAPINE FUMARATE 50 MG PO TABS: 50.0000 mg | ORAL_TABLET | Freq: Every day | ORAL | Status: DC

## 2015-07-01 NOTE — Telephone Encounter (Signed)
Forwarding msg to pcp...lmb

## 2015-07-01 NOTE — Telephone Encounter (Signed)
Confirm and document reason for call. If symptomatic, describe symptoms. ---CALLER STATES THAT HE WAS JUST IN THE OFFICE FOR NOT BEING ABLE TO SLEEP. THE MD PUT HIM ON TWO MEDICATIONS FOR DEPRESSION AND HE STATES THAT IS NOT THE ISSUE AND HE IS STILL NOT ABLE TO SLEEP FOR THE PAST TWO NIGHTS HE IS NEEDING SOMETHING FOR SLEEP. Has the patient traveled out of the country within the last 30 days? ---Not Applicable Does the patient require triage? ---Yes Related visit to physician within the last 2 weeks? ---Yes Does the PT have any chronic conditions? (i.e. diabetes, asthma, etc.) ---Yes List chronic conditions. ---CYSTITIS, GLYCEMIC ISSUES, HYPERTENSION, FIBROMYALGIA, Guidelines Guideline Title Affirmed Question Affirmed Notes Insomnia Requesting medication for sleep ("sleeping pill") Final Disposition User Call PCP within 24 Hours [place], Therapist, sports, Amy Comments CALLER [place], AMBIEN, LORAZEPAM. [place]; THAT HE DID HAVE SIDE EFFECTS FROM THE AMBIEN. HE DID WANT THE MD TO KNOW THAT HE DOES HAVE IT. HE WOULD LIKE FOR SOMEONE FROM THE OFFICE TO CALL HIM TODAY AS HE STATES THE LAST TIME IT TOOK HIM 17 DAYS TO GET A CALLBACK FROM THE OFFICE. Referrals REFERRED TO PCP OFFICE Disagree/Comply: Comply

## 2015-07-04 DIAGNOSIS — N3010 Interstitial cystitis (chronic) without hematuria: Secondary | ICD-10-CM | POA: Diagnosis not present

## 2015-07-04 DIAGNOSIS — N3946 Mixed incontinence: Secondary | ICD-10-CM | POA: Diagnosis not present

## 2015-07-04 DIAGNOSIS — M797 Fibromyalgia: Secondary | ICD-10-CM | POA: Diagnosis not present

## 2015-07-04 DIAGNOSIS — Z79891 Long term (current) use of opiate analgesic: Secondary | ICD-10-CM | POA: Diagnosis not present

## 2015-07-04 DIAGNOSIS — Z79899 Other long term (current) drug therapy: Secondary | ICD-10-CM | POA: Diagnosis not present

## 2015-07-04 DIAGNOSIS — R300 Dysuria: Secondary | ICD-10-CM | POA: Diagnosis not present

## 2015-07-04 DIAGNOSIS — K589 Irritable bowel syndrome without diarrhea: Secondary | ICD-10-CM | POA: Diagnosis not present

## 2015-07-04 DIAGNOSIS — N521 Erectile dysfunction due to diseases classified elsewhere: Secondary | ICD-10-CM | POA: Diagnosis not present

## 2015-07-04 DIAGNOSIS — R339 Retention of urine, unspecified: Secondary | ICD-10-CM | POA: Diagnosis not present

## 2015-07-04 DIAGNOSIS — R351 Nocturia: Secondary | ICD-10-CM | POA: Diagnosis not present

## 2015-07-04 DIAGNOSIS — R3911 Hesitancy of micturition: Secondary | ICD-10-CM | POA: Diagnosis not present

## 2015-07-04 DIAGNOSIS — F329 Major depressive disorder, single episode, unspecified: Secondary | ICD-10-CM | POA: Diagnosis not present

## 2015-07-04 DIAGNOSIS — Z87891 Personal history of nicotine dependence: Secondary | ICD-10-CM | POA: Diagnosis not present

## 2015-07-04 DIAGNOSIS — I2510 Atherosclerotic heart disease of native coronary artery without angina pectoris: Secondary | ICD-10-CM | POA: Diagnosis not present

## 2015-07-05 ENCOUNTER — Telehealth: Payer: Self-pay | Admitting: *Deleted

## 2015-07-05 NOTE — Telephone Encounter (Signed)
[facility] - Client [place] [facility] Patient Name: [name] Gender: Male DOB: 1946/06/30 Age: 69 Y 76 M 18 D Return Phone Number: [phone] (Primary), [phone] (Secondary) Address: [place] City/State/Zip: [place] [address] Client [facility] - Client Client [facility] Day Physician Cathlean Cower Contact Type Call Call Type Triage / Clinical Relationship To Patient Self Appointment Disposition EMR Appointment Not Necessary Info pasted into Epic Yes Return Phone Number [phone] (Primary) Chief Complaint Insomnia Initial Comment Caller states was in office 3 days ago for sleep meds, been up for several nights, meds not helping PreDisposition Call Doctor Nurse Assessment Nurse: Mechele Dawley, RN, Amy Date/Time Eilene Ghazi Time): 07/01/2015 11:17:13 AM Confirm and document reason for call. If symptomatic, describe symptoms. ---CALLER STATES THAT HE WAS JUST IN THE OFFICE FOR NOT BEING ABLE TO SLEEP. THE MD PUT HIM ON TWO MEDICATIONS FOR DEPRESSION AND HE STATES THAT IS NOT THE ISSUE AND HE IS STILL NOT ABLE TO SLEEP FOR THE PAST TWO NIGHTS HE IS NEEDING SOMETHING FOR SLEEP. Has the patient traveled out of the country within the last 30 days? ---Not Applicable Does the patient require triage? ---Yes Related visit to physician within the last 2 weeks? ---Yes Does the PT have any chronic conditions? (i.e. diabetes, asthma, etc.) ---Yes List chronic conditions. ---CYSTITIS, GLYCEMIC ISSUES, HYPERTENSION, FIBROMYALGIA, Guidelines Guideline Title Affirmed Question Affirmed Notes Nurse Date/Time (Eastern Time) Insomnia Requesting medication for sleep ("sleeping pill") [place], RN, Amy 07/01/2015 11:18:45 AM Disp. Time Eilene Ghazi Time) Disposition Final User 07/01/2015 11:27:14 AM Call PCP within 24 Hours Yes [place], RN, Amy Caller Understands: Yes PLEASE NOTE: All timestamps  contained within this report are represented as [place] Standard Time. CONFIDENTIALTY NOTICE: This fax transmission is intended only for the addressee. It contains information that is legally privileged, confidential or otherwise protected from use or disclosure. If you are not the intended recipient, you are strictly prohibited from reviewing, disclosing, copying using or disseminating any of this information or taking any action in reliance on or regarding this information. If you have received this fax in error, please notify us immediately by telephone so that we can arrange for its return to [name]. Phone: [phone], Toll-Free: [phone], Fax: [phone] Page: 2 of 2 Call Id: [id number] Disagree/Comply: Comply Care Advice Given Per Guideline CALL PCP WITHIN 24 HOURS: You need to discuss this with your doctor within the next 24 hours. CALL BACK IF: * You become worse. CARE ADVICE given per Insomnia (Adult) guideline. After Care Instructions Given Call Event Type User Date / Time Description Comments User: Susanne Borders, RN Date/Time Eilene Ghazi Time): 07/01/2015 11:23:43 AM CALLER STATES THAT HE HAS OTHER MEDICATIONS VALIUM, AMBIEN, LORAZEPAM. HE STATES TO THE NURSE HOWEVER; THAT HE DID HAVE SIDE EFFECTS FROM THE AMBIEN. HE DID WANT THE MD TO KNOW THAT HE DOES HAVE IT. HE WOULD LIKE FOR SOMEONE FROM THE OFFICE TO CALL HIM TODAY AS HE STATES THE LAST TIME IT TOOK HIM 17 DAYS TO GET A CALLBACK FROM THE OFFICE. Referrals REFERRED TO PCP OFFICE

## 2015-07-12 DIAGNOSIS — Z936 Other artificial openings of urinary tract status: Secondary | ICD-10-CM | POA: Diagnosis not present

## 2015-07-13 DIAGNOSIS — R32 Unspecified urinary incontinence: Secondary | ICD-10-CM | POA: Diagnosis not present

## 2015-07-19 ENCOUNTER — Ambulatory Visit (INDEPENDENT_AMBULATORY_CARE_PROVIDER_SITE_OTHER): Payer: 59 | Admitting: Psychiatry

## 2015-07-19 ENCOUNTER — Telehealth: Payer: Self-pay | Admitting: *Deleted

## 2015-07-19 DIAGNOSIS — F324 Major depressive disorder, single episode, in partial remission: Secondary | ICD-10-CM

## 2015-07-19 NOTE — Telephone Encounter (Signed)
Ok to stop the seroquel as this is the most likely, the lexapro can cause "sluggishness" but not usually at this dose  The seroquel was for sleep; if the pill can be HALVED, it is ok to try this to see if the effect for sleep is [facility] for this pt, as he has had intolerance or ineffectiveness of mult other meds  If this does not help, I have nothing else to offer at this time.

## 2015-07-19 NOTE — Telephone Encounter (Signed)
Pt  States he think he is taking too much on one of these medications. He states he takes Escitalopram 10 mg & Seroquel 50 mg both at night, but lately he feel like he is sedated when he take meds. He stated last night he took half on both meds and still felt groggy this morning. Requesting recommendation on med...[name]

## 2015-07-20 NOTE — Telephone Encounter (Signed)
Notified pt with md response.../lmb 

## 2015-08-09 DIAGNOSIS — M069 Rheumatoid arthritis, unspecified: Secondary | ICD-10-CM | POA: Diagnosis not present

## 2015-08-09 DIAGNOSIS — I2510 Atherosclerotic heart disease of native coronary artery without angina pectoris: Secondary | ICD-10-CM | POA: Diagnosis not present

## 2015-08-09 DIAGNOSIS — N390 Urinary tract infection, site not specified: Secondary | ICD-10-CM | POA: Diagnosis not present

## 2015-08-09 DIAGNOSIS — M797 Fibromyalgia: Secondary | ICD-10-CM | POA: Diagnosis not present

## 2015-08-09 DIAGNOSIS — T8351XA Infection and inflammatory reaction due to indwelling urinary catheter, initial encounter: Secondary | ICD-10-CM | POA: Diagnosis not present

## 2015-08-11 ENCOUNTER — Ambulatory Visit: Payer: Self-pay | Admitting: Psychiatry

## 2015-08-15 DIAGNOSIS — N3010 Interstitial cystitis (chronic) without hematuria: Secondary | ICD-10-CM | POA: Diagnosis not present

## 2015-08-18 ENCOUNTER — Encounter: Payer: Self-pay | Admitting: Internal Medicine

## 2015-08-18 ENCOUNTER — Ambulatory Visit (INDEPENDENT_AMBULATORY_CARE_PROVIDER_SITE_OTHER): Payer: Medicare Other | Admitting: Internal Medicine

## 2015-08-18 VITALS — BP 122/82 | HR 96 | Temp 98.0°F | Ht 68.0 in | Wt 160.0 lb

## 2015-08-18 DIAGNOSIS — H9201 Otalgia, right ear: Secondary | ICD-10-CM

## 2015-08-18 DIAGNOSIS — G4700 Insomnia, unspecified: Secondary | ICD-10-CM

## 2015-08-18 MED ORDER — HYDRALAZINE HCL 50 MG PO TABS: 50.0000 mg | ORAL_TABLET | Freq: Three times a day (TID) | ORAL | Status: DC

## 2015-08-18 MED ORDER — HYDRALAZINE HCL 50 MG PO TABS: 50.0000 mg | ORAL_TABLET | Freq: Every evening | ORAL | Status: DC | PRN

## 2015-08-18 NOTE — Progress Notes (Signed)
Subjective:    Patient ID: Erik Lolling Sr., male    DOB: 23-Dec-1945, 69 y.o.   MRN: [id number]  HPI He is here to discuss a different medication option for his insomnia.  He is also have ear popping and cracking.  Right ear popping and cracking:  For the past 4-6 weeks his right ear has been popping and cracking intermittently.   He denies pain or discharge.  He has mild sinus pressure and nasal congestion.  He denies sinus pain.   Insomnia:  He has a long history of insomnia that has been related to his interstitial cystitis - frequent urination at night and pain.  He now has a suprapubic catheter and he no long has nocturia.  He was taking seroquel but this causes too much sedation during the day.  The medication is not always effective.  It is also causing hyperglycemia.  He is sometimes able to sleep without medication, but not consistently.  He also takes hydrocodone on occasion for sedation, but it also causes too much sedation.  He uses ativan on a rare occasion, but this has affected his memory and he does not like to take it. He has been on several other medications in the past.  Clonazepam - too sedating ambien - did not work well Hydralazine - worked well - caused retention of urine so it was stopped Trazodone - did not work Amitriptyline - cant remember Lyrica - ? Worked ? Affect or taken lunesta or sonata   Medications and allergies reviewed with patient and updated if appropriate. Past Medical History  Diagnosis Date  . Allergic rhinitis   . Rheumatoid arthritis(714.0)   . Insomnia   . Anxiety   . CAD (coronary artery disease)   . Diverticulitis   . GERD with stricture   . HLD (hyperlipidemia)   . HTN (hypertension)   . Interstitial cystitis   . IBS (irritable bowel syndrome)   . Anal fissure   . Depression   . Hiatal hernia   . Internal and external hemorrhoids without complication   . FIBROMYALGIA 06/09/2010    Qualifier: Diagnosis of  By: Carlean Purl MD, Dimas Millin Irritable bowel syndrome 06/09/2010    Qualifier: Diagnosis of  By: Carlean Purl MD, FACG, [place] CYSTITIS 06/09/2010    Qualifier: Diagnosis of  By: Carlean Purl MD, Dimas Millin Diverticulosis 12/18/2011  . 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 Helicobacter pylori (H. pylori) infection 11/25/2012    11/2012 EGD + gastric bxs  . Impotence of organic origin   . Coronary atherosclerosis of native coronary artery 2007    nonobstructive CAD by cath with 40% mid-LAD stenosis  . Hyperlipidemia   . Fibromyalgia   . Reactive hypoglycemia     Past Surgical History  Procedure Laterality Date  . Nissen fundoplication  7253  . Tonsillectomy and adenoidectomy    . Vasectomy    . Knee arthroscopy      right x 2  . Transurethral resection of prostate      x 2  . Bladder distention      x 6  . Shoulder arthroscopy  2010    left  . Colonoscopy  05/10/2006    diverticulosis, internal and external hemorrhoids  . Upper gastrointestinal endoscopy  05/13/2008    hiatal hernia  . Appendectomy  1957    Social History   Social History  . Marital Status: Unknown  Spouse Name: N/A  . Number of Children: 4  . Years of Education: N/A   Occupational History  . retired    Social History Main Topics  . Smoking status: Former Research scientist (life sciences)  . Smokeless tobacco: Never Used     Comment: quit 1980  . Alcohol Use: No  . Drug Use: No  . Sexual Activity: Not Asked   Other Topics Concern  . None   Social History Narrative   ** Merged History Encounter **        Review of Systems  Constitutional: Positive for fatigue. Negative for fever and chills.  HENT: Positive for congestion and sinus pressure. Negative for ear discharge, ear pain (and popping), hearing loss, postnasal drip and sore throat.   Respiratory: Positive for cough (dry). Negative for shortness of breath and wheezing.   Cardiovascular: Negative for chest pain.  Neurological: Positive for dizziness (? related to  medication). Negative for headaches.  Psychiatric/Behavioral: Positive for sleep disturbance.       Objective:   Filed Vitals:   08/18/15 1116  BP: 122/82  Pulse: 96  Temp: 98 F (36.7 C)   Filed Weights   08/18/15 1116  Weight: 160 lb (72.576 kg)   Body mass index is 24.33 kg/(m^2).   Physical Exam  Constitutional: He is oriented to person, place, and time. He appears well-developed and well-nourished. No distress.  HENT:  Head: Normocephalic and atraumatic.  Right Ear: External ear normal.  Left Ear: External ear normal.  Mouth/Throat: Oropharynx is clear and moist.  No excessive cerumen in ear canals, TM's normal bilaterally  Eyes: Conjunctivae are normal.  Neck: Neck supple.  Lymphadenopathy:    He has no cervical adenopathy.  Neurological: He is alert and oriented to person, place, and time.  Psychiatric: He has a normal mood and affect. His behavior is normal. Judgment and thought content normal.        Assessment & Plan:   Right ear popping and cracking Exam is normal Likely related to seasonal allergies Hydralazine may help Can try flonase or saline nasal sprays  Insomnia Chronic Has been related to IC and frequent nocturia, which is no longer an issue since the suprapubic catheter D/c seroquel due to sedation in am/day and hyperglycemia Trial of hydralazine since this worked in the past Avoid hydrocodone Ativan only for emergency use - severe insomnia and nothing else is working Can consider retrying amitriptyline or gabapentin May need some sleep behavioral therapy

## 2015-08-18 NOTE — Patient Instructions (Signed)
A prescription for hydralazine was sent to your pharmacy.  If this is not effective let us know.  Alternatives may include amitriptyline, gabapentin or lyrica.    Your ear pain is likely related to allergies. You can try flonase nasal spray, saline nasal sprays if needed.

## 2015-08-18 NOTE — Progress Notes (Signed)
Pre visit review using our clinic review tool, if applicable. No additional management support is needed unless otherwise documented below in the visit note. 

## 2015-08-19 DIAGNOSIS — N3010 Interstitial cystitis (chronic) without hematuria: Secondary | ICD-10-CM | POA: Diagnosis not present

## 2015-08-23 ENCOUNTER — Telehealth: Payer: Self-pay | Admitting: Internal Medicine

## 2015-08-23 MED ORDER — GABAPENTIN 300 MG PO CAPS: 300.0000 mg | ORAL_CAPSULE | Freq: Every day | ORAL | Status: DC

## 2015-08-23 NOTE — Telephone Encounter (Signed)
Pt has been informed.

## 2015-08-23 NOTE — Telephone Encounter (Signed)
Lets try gabapentin at night - we will start with 300 mg at nighttime.  I will send a prescription to his pharmacy.

## 2015-08-23 NOTE — Telephone Encounter (Signed)
Please advise 

## 2015-08-23 NOTE — Telephone Encounter (Signed)
Pt was in to see Dr. Quay Burow last week and he is still not feeling any better.  She told him to call if he wasn't Can you please call pt with what to do next

## 2015-08-25 ENCOUNTER — Ambulatory Visit (INDEPENDENT_AMBULATORY_CARE_PROVIDER_SITE_OTHER): Payer: 59 | Admitting: Psychiatry

## 2015-08-25 ENCOUNTER — Telehealth: Payer: Self-pay | Admitting: Internal Medicine

## 2015-08-25 DIAGNOSIS — F324 Major depressive disorder, single episode, in partial remission: Secondary | ICD-10-CM

## 2015-08-25 NOTE — Telephone Encounter (Signed)
Pt called in said that Dr Quay Burow told him to call back in for meds were not helping him.  He said that they are not working.  What does he needs to do?    Best number [phone]

## 2015-08-25 NOTE — Telephone Encounter (Signed)
Have him try taking two pills at night - if that does not work we will need to try something different.

## 2015-08-25 NOTE — Telephone Encounter (Signed)
Pt has been informed.

## 2015-08-25 NOTE — Telephone Encounter (Signed)
Please advise 

## 2015-09-01 ENCOUNTER — Encounter: Payer: Self-pay | Admitting: Internal Medicine

## 2015-09-01 ENCOUNTER — Ambulatory Visit (INDEPENDENT_AMBULATORY_CARE_PROVIDER_SITE_OTHER): Payer: Medicare Other | Admitting: Internal Medicine

## 2015-09-01 VITALS — BP 128/64 | HR 83 | Temp 97.8°F | Wt 173.0 lb

## 2015-09-01 DIAGNOSIS — M79604 Pain in right leg: Secondary | ICD-10-CM | POA: Diagnosis not present

## 2015-09-01 DIAGNOSIS — G4700 Insomnia, unspecified: Secondary | ICD-10-CM | POA: Diagnosis not present

## 2015-09-01 DIAGNOSIS — F419 Anxiety disorder, unspecified: Secondary | ICD-10-CM | POA: Diagnosis not present

## 2015-09-01 DIAGNOSIS — M25511 Pain in right shoulder: Secondary | ICD-10-CM | POA: Insufficient documentation

## 2015-09-01 DIAGNOSIS — M25512 Pain in left shoulder: Secondary | ICD-10-CM

## 2015-09-01 MED ORDER — LORAZEPAM 0.5 MG PO TABS: 0.5000 mg | ORAL_TABLET | Freq: Two times a day (BID) | ORAL | Status: DC | PRN

## 2015-09-01 MED ORDER — ZOLPIDEM TARTRATE 5 MG PO TABS: 5.0000 mg | ORAL_TABLET | Freq: Every evening | ORAL | Status: DC | PRN

## 2015-09-01 NOTE — Progress Notes (Signed)
Subjective:    Patient ID: Erik Lolling Sr., male    DOB: 06/25/1946, 69 y.o.   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 Pain better with rest. Also with persistent difficutly sleeping, and Has had worsening depressive symptoms some improved with counseling, but no suicidal ideation, or panic; has ongoing anxiety.  Pt denies chest pain, increased sob or doe, wheezing, orthopnea, PND, increased LE swelling, palpitations, dizziness or syncope.  Pt denies new neurological symptoms such as new headache, or facial or extremity weakness or numbness.  No fever, Past Medical History  Diagnosis Date  . Allergic rhinitis   . Rheumatoid arthritis(714.0)   . Insomnia   . Anxiety   . CAD (coronary artery disease)   . Diverticulitis   . GERD with stricture   . HLD (hyperlipidemia)   . HTN (hypertension)   . Interstitial cystitis   . IBS (irritable bowel syndrome)   . Anal fissure   . Depression   . Hiatal hernia   . Internal and external hemorrhoids without complication   . FIBROMYALGIA 06/09/2010    Qualifier: Diagnosis of  By: Carlean Purl MD, Dimas Millin Irritable bowel syndrome 06/09/2010    Qualifier: Diagnosis of  By: Carlean Purl MD, FACG, [place] CYSTITIS 06/09/2010    Qualifier: Diagnosis of  By: Carlean Purl MD, Dimas Millin Diverticulosis 12/18/2011  . 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 Helicobacter pylori (H. pylori) infection 11/25/2012    11/2012 EGD + gastric bxs  . Impotence of organic origin   . Coronary atherosclerosis of native coronary artery 2007    nonobstructive CAD by cath with 40% mid-LAD stenosis  . Hyperlipidemia   . Fibromyalgia   . Reactive hypoglycemia    Past Surgical History  Procedure Laterality Date  . Nissen  fundoplication  9449  . Tonsillectomy and adenoidectomy    . Vasectomy    . Knee arthroscopy      right x 2  . Transurethral resection of prostate      x 2  . Bladder distention      x 6  . Shoulder arthroscopy  2010    left  . Colonoscopy  05/10/2006    diverticulosis, internal and external hemorrhoids  . Upper gastrointestinal endoscopy  05/13/2008    hiatal hernia  . Appendectomy  1957    reports that he has quit smoking. He has never used smokeless tobacco. He reports that he does not drink alcohol or use illicit drugs. family history includes Breast cancer in his sister; Colon polyps in his father; Diabetes in his father, other, and sister; Heart disease in his father and mother; Hypertension in his father and mother; Stroke in his mother. There is no history of Colon cancer. Allergies  Allergen Reactions  . Pneumovax [Pneumococcal Polysaccharide Vaccine] Other (See Comments)    pain  . Prilosec [Omeprazole] Other (See Comments)    Per patient joint pain with PPI's  . Statins Other (See Comments)    Joint pain   Current Outpatient Prescriptions on File Prior to Visit  Medication Sig Dispense Refill  . acetaminophen (TYLENOL) 500 MG tablet Take 1,000 mg by mouth every 6 (six) hours as needed for  moderate pain or headache.    . escitalopram (LEXAPRO) 10 MG tablet Take 1 tablet (10 mg total) by mouth daily. 90 tablet 3  . PRESCRIPTION MEDICATION 15 mLs by Intracatheter route. Marcaine--Installation,    . gabapentin (NEURONTIN) 300 MG capsule Take 1 capsule (300 mg total) by mouth at bedtime. (Patient not taking: Reported on 09/01/2015) 30 capsule 3  . HYDROcodone-acetaminophen (NORCO) 5-325 MG tablet Take 1 tablet by mouth.    . pentosan polysulfate (ELMIRON) 100 MG capsule 100 mg by Intracatheter route. Instillation.    . traMADol (ULTRAM) 50 MG tablet Take 1 tablet (50 mg total) by mouth daily as needed for moderate pain. (Patient not taking: Reported on 09/01/2015) 90 tablet 1     No current facility-administered medications on file prior to visit.   Review of Systems  Constitutional: Negative for unusual diaphoresis or night sweats HENT: Negative for ringing in ear or discharge Eyes: Negative for double vision or worsening visual disturbance.  Respiratory: Negative for choking and stridor.   Gastrointestinal: Negative for vomiting or other signifcant bowel change Genitourinary: Negative for hematuria or change in urine volume.  Musculoskeletal: Negative for other MSK pain or swelling Skin: Negative for color change and worsening wound.  Neurological: Negative for tremors and numbness other than noted  Psychiatric/Behavioral: Negative for decreased concentration or agitation other than above       Objective:   Physical Exam BP 128/64 mmHg  Pulse 83  Temp(Src) 97.8 F (36.6 C)  Wt 173 lb (78.472 kg)  SpO2 97% VS noted,  Constitutional: Pt appears in no significant distress HENT: Head: NCAT.  Right Ear: External ear normal.  Left Ear: External ear normal.  Eyes: . Pupils are equal, round, and reactive to light. Conjunctivae and EOM are normal Neck: Normal range of motion. Neck supple.  Cardiovascular: Normal rate and regular rhythm.   Pulmonary/Chest: Effort normal and breath sounds without rales or wheezing.  Abd:  Soft, NT, ND, + BS Neurological: Pt is alert. Not confused , motor grossly intact Skin: Skin is warm. No rash, no LE edema, bialt LE dorsalis pedis trace to 1+ Psychiatric: Pt behavior is normal. No agitation.     Assessment & Plan:

## 2015-09-01 NOTE — Patient Instructions (Signed)
Please take all new medication as prescribed - the ambien, and the lorazepam  You will be contacted regarding the referral for: Leg circulation testing  Please continue all other medications as before, and refills have been done if requested.  Please have the pharmacy call with any other refills you may need.  Please keep your appointments with your specialists as you may have planned

## 2015-09-01 NOTE — Progress Notes (Signed)
Patient received education resource, including the self-management goal and tool. Patient verbalized understanding. 

## 2015-09-03 NOTE — Assessment & Plan Note (Signed)
Ok for ambien qhs prn,  to f/u any worsening symptoms or concerns  

## 2015-09-03 NOTE — Assessment & Plan Note (Signed)
Mod to severe, for lorazepam prn,  to f/u any worsening symptoms or concerns, cont counseling

## 2015-09-03 NOTE — Assessment & Plan Note (Signed)
Mild to mod, Cant r/o PAD, for LE arterial dopplers

## 2015-09-03 NOTE — Assessment & Plan Note (Signed)
To cont f/u with ortho,  to f/u any worsening symptoms or concerns

## 2015-09-08 ENCOUNTER — Telehealth: Payer: Self-pay | Admitting: Internal Medicine

## 2015-09-08 ENCOUNTER — Ambulatory Visit: Payer: 59 | Admitting: Psychiatry

## 2015-09-08 NOTE — Telephone Encounter (Signed)
Patient states Dr. Jenny Reichmann was to refer him for circulation in his legs.  Did not see a referral.  Can you please follow up with patient.

## 2015-09-08 NOTE — Telephone Encounter (Signed)
Order placed 10.20.2016, please advise

## 2015-09-12 DIAGNOSIS — R339 Retention of urine, unspecified: Secondary | ICD-10-CM | POA: Diagnosis not present

## 2015-09-14 NOTE — Telephone Encounter (Signed)
Pt scheduled for 09/20/15.

## 2015-09-15 ENCOUNTER — Other Ambulatory Visit: Payer: Self-pay | Admitting: Internal Medicine

## 2015-09-15 DIAGNOSIS — R0989 Other specified symptoms and signs involving the circulatory and respiratory systems: Secondary | ICD-10-CM

## 2015-09-15 DIAGNOSIS — Z936 Other artificial openings of urinary tract status: Secondary | ICD-10-CM | POA: Diagnosis not present

## 2015-09-15 DIAGNOSIS — I739 Peripheral vascular disease, unspecified: Secondary | ICD-10-CM

## 2015-09-20 ENCOUNTER — Ambulatory Visit (HOSPITAL_COMMUNITY)
Admission: RE | Admit: 2015-09-20 | Discharge: 2015-09-20 | Disposition: A | Discharge status: Home / Self Care | Payer: Medicare Other | Source: Ambulatory Visit | Attending: Internal Medicine | Admitting: Internal Medicine

## 2015-09-20 DIAGNOSIS — E785 Hyperlipidemia, unspecified: Secondary | ICD-10-CM | POA: Insufficient documentation

## 2015-09-20 DIAGNOSIS — R0989 Other specified symptoms and signs involving the circulatory and respiratory systems: Secondary | ICD-10-CM | POA: Insufficient documentation

## 2015-09-20 DIAGNOSIS — I739 Peripheral vascular disease, unspecified: Secondary | ICD-10-CM | POA: Insufficient documentation

## 2015-09-20 DIAGNOSIS — I10 Essential (primary) hypertension: Secondary | ICD-10-CM | POA: Insufficient documentation

## 2015-10-04 DIAGNOSIS — N3010 Interstitial cystitis (chronic) without hematuria: Secondary | ICD-10-CM | POA: Diagnosis not present

## 2015-10-13 DIAGNOSIS — T83010A Breakdown (mechanical) of cystostomy catheter, initial encounter: Secondary | ICD-10-CM | POA: Diagnosis not present

## 2015-10-13 DIAGNOSIS — Z6825 Body mass index (BMI) 25.0-25.9, adult: Secondary | ICD-10-CM | POA: Diagnosis not present

## 2015-10-14 DIAGNOSIS — Z936 Other artificial openings of urinary tract status: Secondary | ICD-10-CM | POA: Diagnosis not present

## 2015-10-24 ENCOUNTER — Ambulatory Visit (INDEPENDENT_AMBULATORY_CARE_PROVIDER_SITE_OTHER): Payer: Medicare Other | Admitting: Physician Assistant

## 2015-10-24 ENCOUNTER — Other Ambulatory Visit (INDEPENDENT_AMBULATORY_CARE_PROVIDER_SITE_OTHER): Payer: Medicare Other

## 2015-10-24 ENCOUNTER — Encounter: Payer: Self-pay | Admitting: Physician Assistant

## 2015-10-24 VITALS — BP 124/70 | HR 88 | Ht 68.0 in | Wt 169.6 lb

## 2015-10-24 DIAGNOSIS — R1013 Epigastric pain: Secondary | ICD-10-CM

## 2015-10-24 DIAGNOSIS — K649 Unspecified hemorrhoids: Secondary | ICD-10-CM

## 2015-10-24 LAB — COMPREHENSIVE METABOLIC PANEL
ALT: 18 U/L (ref 0–53)
AST: 17 U/L (ref 0–37)
Albumin: 4.5 g/dL (ref 3.5–5.2)
Alkaline Phosphatase: 77 U/L (ref 39–117)
BUN: 14 mg/dL (ref 6–23)
CO2: 31 mEq/L (ref 19–32)
Calcium: 10.1 mg/dL (ref 8.4–10.5)
Chloride: 101 mEq/L (ref 96–112)
Creatinine, Ser: 1.3 mg/dL (ref 0.40–1.50)
GFR: 58.24 mL/min — ABNORMAL LOW (ref >60.00)
Glucose, Bld: 118 mg/dL — ABNORMAL HIGH (ref 70–99)
Potassium: 4.9 mEq/L (ref 3.5–5.1)
Sodium: 140 mEq/L (ref 135–145)
Total Bilirubin: 0.6 mg/dL (ref 0.2–1.2)
Total Protein: 7.6 g/dL (ref 6.0–8.3)

## 2015-10-24 LAB — CBC WITH DIFFERENTIAL/PLATELET
Basophils Absolute: 0.1 10*3/uL (ref 0.0–0.1)
Basophils Relative: 0.6 % (ref 0.0–3.0)
Eosinophils Absolute: 0.1 10*3/uL (ref 0.0–0.7)
Eosinophils Relative: 1.7 % (ref 0.0–5.0)
HCT: 49.7 % (ref 39.0–52.0)
Hemoglobin: 16.7 g/dL (ref 13.0–17.0)
Lymphocytes Relative: 32.3 % (ref 12.0–46.0)
Lymphs Abs: 2.7 10*3/uL (ref 0.7–4.0)
MCHC: 33.7 g/dL (ref 30.0–36.0)
MCV: 90 fl (ref 78.0–100.0)
Monocytes Absolute: 0.7 10*3/uL (ref 0.1–1.0)
Monocytes Relative: 8.4 % (ref 3.0–12.0)
Neutro Abs: 4.8 10*3/uL (ref 1.4–7.7)
Neutrophils Relative %: 57 % (ref 43.0–77.0)
Platelets: 344 10*3/uL (ref 150.0–400.0)
RBC: 5.53 Mil/uL (ref 4.22–5.81)
RDW: 12.8 % (ref 11.5–15.5)
WBC: 8.4 10*3/uL (ref 4.0–10.5)

## 2015-10-24 LAB — LIPASE: Lipase: 31 U/L (ref 11.0–59.0)

## 2015-10-24 MED ORDER — CIMETIDINE 800 MG PO TABS: 800.0000 mg | ORAL_TABLET | Freq: Every day | ORAL | Status: DC

## 2015-10-24 MED ORDER — HYDROCORTISONE ACETATE 25 MG RE SUPP: RECTAL | Status: DC

## 2015-10-24 NOTE — Progress Notes (Signed)
Patient ID: Erik Lolling Sr., male   DOB: [birth year], 69 y.o.   MRN: [id number]   Subjective:    Patient ID: Erik Lolling Sr., male    DOB: 1946/05/22, 69 y.o.   MRN: [id number]  HPI [name] is a 69 year old white male known to Dr. Carlean Purl with history of interstitial cystitis, fibromyalgia, coronary artery disease, Somatization disorder, history of IBS, diverticulosis and H. pylori gastritis. Colonoscopy was done in March 2013 no polyps found he did have internal hemorrhoids and mild sigmoid diverticulosis. Random biopsies were taken for complaints of diarrhea and these were negative. EGD done in January 2014 showed mild gastritis. Biopsies positive for H. pylori and treated with Pylera. He also was empirically dilated for complaints of dysphagia.   Patient comes in today stating that he is been having recent problems with epigastric pain which he  describes as a gnawing sensation. This is frequently better with some food on his stomach said vague nausea no vomiting appetite has been good and weight loss. He denies any dysphagia or odynophagia. Had intermittent mild burning in his rectum and noticed bright red blood on the tissue a few weeks back but has not seen any in the past and days. He says his bowel movements are normal. He has been concerned because his stools have been looking grayish or black in color. He denies any use of Pepto-Bismol no regular aspirin or NSAIDs. He says his stomach symptoms started after he had a suprapubic catheter put in for interstitial cystitis. This is since been removed. As he also had to double up on his lorazepam dose during that time and says he still concerned because he is having GI discomfort.  Review of Systems Pertinent positive and negative review of systems were noted in the above HPI section.  All other review of systems was otherwise negative.  Outpatient Encounter Prescriptions as of 10/24/2015  Medication Sig  . cimetidine (TAGAMET) 800 MG tablet Take  1 tablet (800 mg total) by mouth at bedtime.  . hydrocortisone (ANUSOL-HC) 25 MG suppository Use suppository at  Bedtime for 5 days as needed for rectal bleeding.  Marland Kitchen LORazepam (ATIVAN) 0.5 MG tablet Take 1 tablet (0.5 mg total) by mouth 2 (two) times daily as needed for anxiety.  Marland Kitchen zolpidem (AMBIEN) 5 MG tablet Take 1 tablet (5 mg total) by mouth at bedtime as needed for sleep.  . [DISCONTINUED] acetaminophen (TYLENOL) 500 MG tablet Take 1,000 mg by mouth every 6 (six) hours as needed for moderate pain or headache.  . [DISCONTINUED] escitalopram (LEXAPRO) 10 MG tablet Take 1 tablet (10 mg total) by mouth daily.  . [DISCONTINUED] gabapentin (NEURONTIN) 300 MG capsule Take 1 capsule (300 mg total) by mouth at bedtime. (Patient not taking: Reported on 09/01/2015)  . [DISCONTINUED] HYDROcodone-acetaminophen (NORCO) 5-325 MG tablet Take 1 tablet by mouth.  . [DISCONTINUED] pentosan polysulfate (ELMIRON) 100 MG capsule 100 mg by Intracatheter route. Instillation.  . [DISCONTINUED] PRESCRIPTION MEDICATION 15 mLs by Intracatheter route. Marcaine--Installation,  . [DISCONTINUED] traMADol (ULTRAM) 50 MG tablet Take 1 tablet (50 mg total) by mouth daily as needed for moderate pain. (Patient not taking: Reported on 09/01/2015)   No facility-administered encounter medications on file as of 10/24/2015.   Allergies  Allergen Reactions  . Pneumovax [Pneumococcal Polysaccharide Vaccine] Other (See Comments)    pain  . Prilosec [Omeprazole] Other (See Comments)    Per patient joint pain with PPI's  . Statins Other (See Comments)    Joint pain  Patient Active Problem List   Diagnosis Date Noted  . Right leg pain 09/01/2015  . Left shoulder pain 09/01/2015  . Fatigue 06/28/2015  . Bladder pain 03/25/2015  . Polyarthralgia 02/02/2015  . Abdominal pain, epigastric 02/01/2015  . Bloating 02/01/2015  . Bradycardia with 31 - 40 beats per minute 06/18/2014  . Chest pain 03/16/2013  . Hyperlipidemia  02/18/2013  . Helicobacter pylori (H. pylori) infection 11/25/2012  . 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 Preventative health care 02/23/2012  . Allergic rhinitis   . Rheumatoid arthritis(714.0)   . Insomnia   . Anxiety   . CAD (coronary artery disease)   . Diverticulitis   . GERD with stricture   . Helicobacter pylori gastritis   . Depression   . Internal and external hemorrhoids without complication   . Irritable bowel syndrome 06/09/2010  . INTERSTITIAL CYSTITIS 06/09/2010  . FIBROMYALGIA 06/09/2010  . FLATULENCE-GAS-BLOATING 06/09/2010   Social History   Social History  . Marital Status: Married    Spouse Name: N/A  . Number of Children: 4  . Years of Education: N/A   Occupational History  . retired    Social History Main Topics  . Smoking status: Former Research scientist (life sciences)  . Smokeless tobacco: Never Used     Comment: quit 1980  . Alcohol Use: No  . Drug Use: No  . Sexual Activity: Not on file   Other Topics Concern  . Not on file   Social History Narrative   ** Merged History Encounter **        [name] family history includes Breast cancer in his sister; Colon polyps in his father; Diabetes in his father, other, and sister; Heart disease in his father and mother; Hypertension in his father and mother; Stroke in his mother. There is no history of Colon cancer.      Objective:    Filed Vitals:   10/24/15 1037  BP: 124/70  Pulse: 88    Physical Exam  well-developed older white male in no acute distress, blood pressure 120 former 70 pulse 88 height 5 foot 8 weight 169. HEENT ;nontraumatic normocephalic EOMI PERRLA sclera anicteric, Cardiovascular; regular rate and rhythm with S1-S2 no murmur or gallop, Pulmonary ;clear bilaterally, Abdomen; soft is tender in the epigastrium there is no guarding or rebound no palpable mass or hepatosplenomegaly also has mild tenderness across the lower abdomen, Bowel sounds are present, Rectal; exam no external hemorrhoids noted  scant stool in the rectal vault Hemoccult negative, Extremities ;no clubbing cyanosis or edema skin warm and dry, Neuropsych ;mood and affect appropriate       Assessment & Plan:   #1 69 yo male with hx of  Hpylori /gastritis with few week c/o epigastric pain/gnawing, gas. R/O gastritis,r/o functional dyspepsia #2 Intermittent scant hematochezia- felt secondary to int hemorrhoids #3 Diverticulosis #4 IC #5 fibromyalgia  Plan; cbc,cmet ,lipase H .pylori stool AG Pt is intolerant to PPIs with joint aching- will try Cimetidine 800 mg qhs.. Pt will try for 2 weeks if no improvement may try adding antispasmotic but hesitant as has hx of  Urinary retention. Pt wants to get off Ativan , but had to double up dose recently with IC flare, suggested he wean back to previous dose first.  He will F/U Dr Carlean Purl     Alfredia Ferguson PA-C 10/24/2015   Cc: [name], MD

## 2015-10-24 NOTE — Progress Notes (Signed)
Agree with Ms. Esterwood's assessment and plan. Adlyn Fife E. Yoshi Mancillas, MD, FACG   

## 2015-10-24 NOTE — Patient Instructions (Signed)
Please go to the basement level to have your labs drawn and stool test. We sent a prescription for Tagamet ( Cimetidine)  800 mg.  We also sent one for Anusol HC Suppositories, .  If too expensive you can get Preperation H suppositories or a store brand.   I made you a follow up appointment with Dr. Silvano Rusk for 12-13-2015  At 3:00 PM.

## 2015-10-26 ENCOUNTER — Other Ambulatory Visit: Payer: Medicare Other

## 2015-10-26 DIAGNOSIS — R1013 Epigastric pain: Secondary | ICD-10-CM | POA: Diagnosis not present

## 2015-10-26 DIAGNOSIS — K649 Unspecified hemorrhoids: Secondary | ICD-10-CM

## 2015-10-27 DIAGNOSIS — I2510 Atherosclerotic heart disease of native coronary artery without angina pectoris: Secondary | ICD-10-CM | POA: Diagnosis not present

## 2015-10-27 DIAGNOSIS — R339 Retention of urine, unspecified: Secondary | ICD-10-CM | POA: Diagnosis not present

## 2015-10-27 DIAGNOSIS — R7309 Other abnormal glucose: Secondary | ICD-10-CM | POA: Diagnosis not present

## 2015-10-27 DIAGNOSIS — Z6826 Body mass index (BMI) 26.0-26.9, adult: Secondary | ICD-10-CM | POA: Diagnosis not present

## 2015-10-27 DIAGNOSIS — E785 Hyperlipidemia, unspecified: Secondary | ICD-10-CM | POA: Diagnosis not present

## 2015-10-28 LAB — H. PYLORI ANTIGEN, STOOL: H pylori Ag, Stl: NEGATIVE

## 2015-11-01 ENCOUNTER — Other Ambulatory Visit: Payer: Self-pay

## 2015-11-06 ENCOUNTER — Encounter: Payer: Self-pay | Admitting: Internal Medicine

## 2015-11-08 ENCOUNTER — Ambulatory Visit (INDEPENDENT_AMBULATORY_CARE_PROVIDER_SITE_OTHER): Payer: Medicare Other | Admitting: Physician Assistant

## 2015-11-08 ENCOUNTER — Encounter: Payer: Self-pay | Admitting: Physician Assistant

## 2015-11-08 VITALS — BP 120/76 | HR 84 | Ht 68.0 in | Wt 170.8 lb

## 2015-11-08 DIAGNOSIS — R110 Nausea: Secondary | ICD-10-CM

## 2015-11-08 DIAGNOSIS — R1013 Epigastric pain: Secondary | ICD-10-CM

## 2015-11-08 MED ORDER — HYDROCODONE-ACETAMINOPHEN 10-325 MG PO TABS
1.0000 | ORAL_TABLET | Freq: Four times a day (QID) | ORAL | Status: DC | PRN
Start: 2015-11-08 — End: 2015-12-23

## 2015-11-08 MED ORDER — SUCRALFATE 1 GM/10ML PO SUSP: 1.0000 g | Freq: Three times a day (TID) | ORAL | Status: DC

## 2015-11-08 NOTE — Patient Instructions (Signed)
You have been scheduled for a CT scan of the abdomen and pelvis at [facility] ([address]---this is in the same building as [facility]).   You are scheduled on 11/10/15 at 11:30 am. You should arrive 15 minutes prior to your appointment time for registration. Please follow the written instructions below on the day of your exam:  WARNING: IF YOU ARE ALLERGIC TO IODINE/X-RAY DYE, PLEASE NOTIFY RADIOLOGY IMMEDIATELY AT [phone]! YOU WILL BE GIVEN A 13 HOUR PREMEDICATION PREP.  1) Do not eat or drink anything after 7:30 am (4 hours prior to your test) 2) You have been given 2 bottles of oral contrast to drink. The solution may taste better if refrigerated, but do NOT add ice or any other liquid to this solution. Shake well before drinking.    Drink 1 bottle of contrast @ 9:30 am (2 hours prior to your exam)  Drink 1 bottle of contrast @ 10:30 am (1 hour prior to your exam)  You may take any medications as prescribed with a small amount of water except for the following: Metformin, Glucophage, Glucovance, Avandamet, Riomet, Fortamet, Actoplus Met, Janumet, Glumetza or Metaglip. The above medications must be held the day of the exam AND 48 hours after the exam.  The purpose of you drinking the oral contrast is to aid in the visualization of your intestinal tract. The contrast solution may cause some diarrhea. Before your exam is started, you will be given a small amount of fluid to drink. Depending on your individual set of symptoms, you may also receive an intravenous injection of x-ray contrast/dye. Plan on being at [facility] for 30 minutes or longer, depending on the type of exam you are having performed.  This test typically takes 30-45 minutes to complete.  If you have any questions regarding your exam or if you need to reschedule, you may call the CT department at [phone] between the hours of 8:00 am and 5:00 pm,  Monday-Friday.  ________________________________________________________________________  Dennis Bast have been scheduled for an endoscopy. Please follow written instructions given to you at your visit today. If you use inhalers (even only as needed), please bring them with you on the day of your procedure. Your physician has requested that you go to www.startemmi.com and enter the access code given to you at your visit today. This web site gives a general overview about your procedure. However, you should still follow specific instructions given to you by our office regarding your preparation for the procedure.  We have sent the following medications to your pharmacy for you to pick up at your convenience: Carafate 1 gm between meals for 1 month.

## 2015-11-08 NOTE — Telephone Encounter (Signed)
Patient contacted and will come in today and see Amy Esterwood PA at 1:30

## 2015-11-08 NOTE — Progress Notes (Signed)
Patient ID: Erik Lolling Sr., male   DOB: 10-31-1946, 69 y.o.   MRN: [id number]   Subjective:    Patient ID: Erik Lolling Sr., male    DOB: 1946/10/04, 69 y.o.   MRN: [id number]  HPI  [name] is a 69 year old white male known to Dr. Carlean Purl with history of interstitial cystitis, fibromyalgia, coronary artery disease, Samet is a patient disorder, history of IBS, diverticulosis and H. pylori gastritis. He was seen in the office here about 2 weeks ago with complaints of epigastric pain and gnawing, hunger pain sensation 2 weeks. He was also complaining of bloating. This felt he may have a recurrent gastritis or functional dyspepsia. Labs were done including CBC see met and lipase all unremarkable and H. pylori stool antigen was negative. He is intolerant to PPIs and many other medications and was therefore placed on Tagamet 800 mg daily at bedtime as this was a medicine heis supposed to also benefit interstitial cystitis. He also is admitting to issues with anxiety at that time. He comes back today stating that he was unable to take the Tagamet as he developed itchy eyes and some urinary retention symptoms. He is continuing to have ongoing GI symptoms with slight nausea and epigastric gnawing and burning type discomfort worse on an empty stomach. He also relates that he is been extremely fatigued, has been having crying spells and says he can't relax at all. He has self increased his dose of Ativan but says she's having a very difficult time and doesn't know what to do. He continues to want to relate his GI symptoms to anxiety and depression. He says that he has been on a couple of different antidepressants in the past but has not been able to tolerate anything long-term for The most recent was amitriptyline.  Review of Systems Pertinent positive and negative review of systems were noted in the above HPI section.  All other review of systems was otherwise negative.  Outpatient Encounter Prescriptions as  of 11/08/2015  Medication Sig  . cyclobenzaprine (FLEXERIL) 10 MG tablet TAKE 1 TABLET BY MOUTH TID PRN  . HYDROcodone-acetaminophen (NORCO) 10-325 MG tablet Take 1 tablet by mouth every 6 (six) hours as needed.  Marland Kitchen LORazepam (ATIVAN) 0.5 MG tablet Take 1 tablet (0.5 mg total) by mouth 2 (two) times daily as needed for anxiety.  Marland Kitchen HYDROcodone-acetaminophen (NORCO) 10-325 MG tablet Take 1 tablet by mouth every 6 (six) hours as needed.  . sucralfate (CARAFATE) 1 GM/10ML suspension Take 10 mLs (1 g total) by mouth 4 (four) times daily -  with meals and at bedtime.  . [DISCONTINUED] cimetidine (TAGAMET) 800 MG tablet Take 1 tablet (800 mg total) by mouth at bedtime.  . [DISCONTINUED] hydrocortisone (ANUSOL-HC) 25 MG suppository Use suppository at  Bedtime for 5 days as needed for rectal bleeding.  . [DISCONTINUED] zolpidem (AMBIEN) 5 MG tablet Take 1 tablet (5 mg total) by mouth at bedtime as needed for sleep.   No facility-administered encounter medications on file as of 11/08/2015.   Allergies  Allergen Reactions  . Pneumovax [Pneumococcal Polysaccharide Vaccine] Other (See Comments)    pain  . Prilosec [Omeprazole] Other (See Comments)    Per patient joint pain with PPI's  . Statins Other (See Comments)    Joint pain  . Tramadol Other (See Comments)   Patient Active Problem List   Diagnosis Date Noted  . Right leg pain 09/01/2015  . Left shoulder pain 09/01/2015  . Fatigue 06/28/2015  . Bladder  pain 03/25/2015  . Polyarthralgia 02/02/2015  . Abdominal pain, epigastric 02/01/2015  . Bloating 02/01/2015  . Bradycardia with 31 - 40 beats per minute 06/18/2014  . Chest pain 03/16/2013  . Hyperlipidemia 02/18/2013  . Helicobacter pylori (H. pylori) infection 11/25/2012  . 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 Preventative health care 02/23/2012  . Allergic rhinitis   . Rheumatoid arthritis(714.0)   . Insomnia   . Anxiety   . CAD (coronary artery disease)   . Diverticulitis   . GERD  with stricture   . Helicobacter pylori gastritis   . Depression   . Internal and external hemorrhoids without complication   . Irritable bowel syndrome 06/09/2010  . INTERSTITIAL CYSTITIS 06/09/2010  . FIBROMYALGIA 06/09/2010  . FLATULENCE-GAS-BLOATING 06/09/2010   Social History   Social History  . Marital Status: Married    Spouse Name: N/A  . Number of Children: 4  . Years of Education: N/A   Occupational History  . retired    Social History Main Topics  . Smoking status: Former Research scientist (life sciences)  . Smokeless tobacco: Never Used     Comment: quit 1980  . Alcohol Use: No  . Drug Use: No  . Sexual Activity: Not on file   Other Topics Concern  . Not on file   Social History Narrative   ** Merged History Encounter **        Mr. Liz family history includes Breast cancer in his sister; Colon polyps in his father; Diabetes in his father, other, and sister; Heart disease in his father and mother; Hypertension in his father and mother; Stroke in his mother. There is no history of Colon cancer.      Objective:    Filed Vitals:   11/08/15 1440  BP: 120/76  Pulse: 84    Physical Exam  well-developed elderly white male in no acute distress, blood pressure 120/76 pulse 84 height 5 foot 8 weight 170. HEENT nontraumatic normocephalic EOMI PERRLA sclera anicteric, Cardiovascular ;regular rate and rhythm with S1-S2 no murmur or gallop, Pulmonary; clear bilaterally, Abdomen; soft, only tender in the epigastrium there is no guarding or rebound no palpable mass or hepatosplenomegaly bowel sounds are present, Rectal ;exam not done, Ext; no clubbing cyanosis or edema skin warm and dry, Neuropsych; patient appears anxious       Assessment & Plan:   #1 69 yo WM with persistent epigastric pain, nausea- R/O PUD ,gastritis, functional dyspepsia, pancreatic disease/lesion #2 Worsening anxiety/depression sxs past 2 weeks #3 hx IBS  #4 IC  #5 hx somatization #6 RA #7 CAD  Plan; Schedule  for Ct abd/pelvis  Schedule for EGD with Dr Carlean Purl- procedure discussed in detail with pt and he is agreeable to proceed  Start Carafate 1 gm between meals and at bedtime x one month  refill Hydrocodone #40/0 refills -usually Rx'd by Dr Jenny Reichmann We have arranged an appt for  him to see Dr Jenny Reichmann later this week as work-in for acute depression/Anxiety symptoms   Ynez Eugenio Genia Harold PA-C 11/08/2015   Cc: [name], MD

## 2015-11-09 ENCOUNTER — Telehealth: Payer: Self-pay | Admitting: Physician Assistant

## 2015-11-09 NOTE — Telephone Encounter (Signed)
Amy is it ok to send the tablet instead due to cost effectiveness?

## 2015-11-10 ENCOUNTER — Other Ambulatory Visit: Payer: Self-pay

## 2015-11-10 ENCOUNTER — Encounter: Payer: Self-pay | Admitting: Internal Medicine

## 2015-11-10 ENCOUNTER — Ambulatory Visit (INDEPENDENT_AMBULATORY_CARE_PROVIDER_SITE_OTHER): Payer: Medicare Other | Admitting: Internal Medicine

## 2015-11-10 ENCOUNTER — Other Ambulatory Visit: Payer: Self-pay | Admitting: *Deleted

## 2015-11-10 ENCOUNTER — Ambulatory Visit (INDEPENDENT_AMBULATORY_CARE_PROVIDER_SITE_OTHER)
Admission: RE | Admit: 2015-11-10 | Discharge: 2015-11-10 | Disposition: A | Discharge status: Home / Self Care | Payer: Medicare Other | Source: Ambulatory Visit | Attending: Physician Assistant | Admitting: Physician Assistant

## 2015-11-10 VITALS — BP 104/78 | HR 85 | Temp 98.7°F | Ht 69.0 in | Wt 171.0 lb

## 2015-11-10 DIAGNOSIS — R1013 Epigastric pain: Secondary | ICD-10-CM | POA: Diagnosis not present

## 2015-11-10 DIAGNOSIS — F329 Major depressive disorder, single episode, unspecified: Secondary | ICD-10-CM | POA: Diagnosis not present

## 2015-11-10 DIAGNOSIS — R935 Abnormal findings on diagnostic imaging of other abdominal regions, including retroperitoneum: Secondary | ICD-10-CM | POA: Diagnosis not present

## 2015-11-10 DIAGNOSIS — F419 Anxiety disorder, unspecified: Secondary | ICD-10-CM | POA: Diagnosis not present

## 2015-11-10 DIAGNOSIS — G4700 Insomnia, unspecified: Secondary | ICD-10-CM

## 2015-11-10 DIAGNOSIS — R110 Nausea: Secondary | ICD-10-CM

## 2015-11-10 DIAGNOSIS — F32A Depression, unspecified: Secondary | ICD-10-CM

## 2015-11-10 MED ORDER — MIRTAZAPINE 30 MG PO TABS: 30.0000 mg | ORAL_TABLET | Freq: Every day | ORAL | Status: DC

## 2015-11-10 MED ORDER — IOHEXOL 300 MG/ML  SOLN
100.0000 mL | Freq: Once | INTRAMUSCULAR | Status: AC | PRN
  Administered 2015-11-10: 100 mL via INTRAVENOUS

## 2015-11-10 MED ORDER — SUCRALFATE 1 G PO TABS: 1.0000 g | ORAL_TABLET | Freq: Three times a day (TID) | ORAL | Status: DC

## 2015-11-10 MED ORDER — CELECOXIB 100 MG PO CAPS: 100.0000 mg | ORAL_CAPSULE | Freq: Two times a day (BID) | ORAL | Status: DC | PRN

## 2015-11-10 NOTE — Progress Notes (Signed)
Pre visit review using our clinic review tool, if applicable. No additional management support is needed unless otherwise documented below in the visit note. 

## 2015-11-10 NOTE — Progress Notes (Signed)
Subjective:    Patient ID: Erik Lolling Sr., male    DOB: 25-Jul-1946, 69 y.o.   MRN: [id number]  HPI  Here to f/u anxiety/depression, has persistent symptoms of low mood, irritability, anxious, not sleeping well, but no wt gain, SI or HI.  Has taken seroquel in past and seemed to help for a few wks with sleep, but then not after this, did not follow up Wt Readings from Last 3 Encounters:  11/10/15 171 lb (77.565 kg)  11/08/15 170 lb 12.8 oz (77.474 kg)  10/24/15 169 lb 9.6 oz (76.93 kg)  c/o poor appetite but no wt loss either recent.  Pt denies chest pain, increased sob or doe, wheezing, orthopnea, PND, increased LE swelling, palpitations, dizziness or syncope.  Pt denies new neurological symptoms such as new headache, or facial or extremity weakness or numbness  Did have CT abd/pelvis earlier today, notes on chart indicate possible need for flex sig vs colonscopy, to be d/w pt further, posisbly done with EGD being scheduled Past Medical History  Diagnosis Date  . Allergic rhinitis   . Rheumatoid arthritis(714.0)   . Insomnia   . Anxiety   . CAD (coronary artery disease)   . Diverticulitis   . GERD with stricture   . HLD (hyperlipidemia)   . HTN (hypertension)   . Interstitial cystitis   . IBS (irritable bowel syndrome)   . Anal fissure   . Depression   . Hiatal hernia   . Internal and external hemorrhoids without complication   . FIBROMYALGIA 06/09/2010    Qualifier: Diagnosis of  By: Carlean Purl MD, Dimas Millin Irritable bowel syndrome 06/09/2010    Qualifier: Diagnosis of  By: Carlean Purl MD, FACG, [place] CYSTITIS 06/09/2010    Qualifier: Diagnosis of  By: Carlean Purl MD, Dimas Millin Diverticulosis 12/18/2011  . 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 Helicobacter pylori (H. pylori) infection 11/25/2012    11/2012 EGD + gastric bxs  . Impotence of organic origin   . Coronary atherosclerosis of native coronary artery 2007    nonobstructive CAD by cath with 40% mid-LAD  stenosis  . Hyperlipidemia   . Fibromyalgia   . Reactive hypoglycemia    Past Surgical History  Procedure Laterality Date  . Nissen fundoplication  [id number]  . Tonsillectomy and adenoidectomy    . Vasectomy    . Knee arthroscopy      right x 2  . Transurethral resection of prostate      x 2  . Bladder distention      x 6  . Shoulder arthroscopy  2010    left  . Colonoscopy  05/10/2006    diverticulosis, internal and external hemorrhoids  . Upper gastrointestinal endoscopy  05/13/2008    hiatal hernia  . Appendectomy  1957  . Catheter removal      super pubic area    reports that he has quit smoking. He has never used smokeless tobacco. He reports that he does not drink alcohol or use illicit drugs. family history includes Breast cancer in his sister; Colon polyps in his father; Diabetes in his father, other, and sister; Heart disease in his father and mother; Hypertension in his father and mother; Stroke in his mother. There is no history of Colon cancer. Allergies  Allergen Reactions  . Pneumovax [Pneumococcal Polysaccharide Vaccine] Other (See Comments)    pain  . Prilosec [Omeprazole] Other (See Comments)    Per patient  joint pain with PPI's  . Statins Other (See Comments)    Joint pain  . Tramadol Other (See Comments)   Current Outpatient Prescriptions on File Prior to Visit  Medication Sig Dispense Refill  . cyclobenzaprine (FLEXERIL) 10 MG tablet TAKE 1 TABLET BY MOUTH TID PRN  0  . HYDROcodone-acetaminophen (NORCO) 10-325 MG tablet Take 1 tablet by mouth every 6 (six) hours as needed.    Marland Kitchen HYDROcodone-acetaminophen (NORCO) 10-325 MG tablet Take 1 tablet by mouth every 6 (six) hours as needed. 40 tablet 0  . LORazepam (ATIVAN) 0.5 MG tablet Take 1 tablet (0.5 mg total) by mouth 2 (two) times daily as needed for anxiety. 60 tablet 2   No current facility-administered medications on file prior to visit.   Review of Systems  Constitutional: Negative for unusual diaphoresis  or night sweats HENT: Negative for ringing in ear or discharge Eyes: Negative for double vision or worsening visual disturbance.  Respiratory: Negative for choking and stridor.   Gastrointestinal: Negative for vomiting or other signifcant bowel change Genitourinary: Negative for hematuria or change in urine volume.  Musculoskeletal: Negative for other MSK pain or swelling Skin: Negative for color change and worsening wound.  Neurological: Negative for tremors and numbness other than noted  Psychiatric/Behavioral: Negative for decreased concentration or agitation other than above       Objective:   Physical Exam BP 104/78 mmHg  Pulse 85  Temp(Src) 98.7 F (37.1 C) (Oral)  Ht 5\' 9"  (1.753 m)  Wt 171 lb (77.565 kg)  BMI 25.24 kg/m2  SpO2 95% VS noted,  Constitutional: Pt appears in no significant distress HENT: Head: NCAT.  Right Ear: External ear normal.  Left Ear: External ear normal.  Eyes: . Pupils are equal, round, and reactive to light. Conjunctivae and EOM are normal Neck: Normal range of motion. Neck supple.  Cardiovascular: Normal rate and regular rhythm.   Pulmonary/Chest: Effort normal and breath sounds without rales or wheezing.  Neurological: Pt is alert. Not confused , motor grossly intact Skin: Skin is warm. No rash, no LE edema Psychiatric: Pt behavior is normal. No agitation. but depressed mood, 2+ nervous    Assessment & Plan:

## 2015-11-10 NOTE — Patient Instructions (Signed)
Please take all new medication as prescribed - the remeron (generic) at 30 mg at bedtime  Please continue all other medications as before, and refills have been done if requested.  Please have the pharmacy call with any other refills you may need.  Please keep your appointments with your specialists as you may have planned  You should hear from GI soon about the plan after the CT today  Please call if you change your mind about a referral to Psychiatry

## 2015-11-10 NOTE — Telephone Encounter (Signed)
Yes that's fine 

## 2015-11-10 NOTE — Telephone Encounter (Signed)
Lm for the patient and advised I sent the Carafate tablets today, 11-10-2015 to his pharmacy since they are more cost effective.  LM that he can smash the tablet between two spoons and add water to it and swallow it down.  LM for his to call me if he has any questions.

## 2015-11-11 NOTE — Assessment & Plan Note (Signed)
Informed pt he should be further contacted soon regarding next steps for ? proctitis

## 2015-11-11 NOTE — Assessment & Plan Note (Signed)
Ok to cont lorazepam prn, to cont counseling, declines psychiatry referral,  to f/u any worsening symptoms or concerns

## 2015-11-11 NOTE — Assessment & Plan Note (Signed)
Would try to avoid further such as ambien for now, to try the remeron as above, to f/u any worsening symptoms or concerns

## 2015-11-11 NOTE — Assessment & Plan Note (Signed)
With difficulty sleeping as well - for remeron 30 qhs, cont counseling, verified no SI or HI, declines psychiatry referral at this time

## 2015-11-15 ENCOUNTER — Ambulatory Visit (AMBULATORY_SURGERY_CENTER): Payer: Self-pay | Admitting: *Deleted

## 2015-11-15 VITALS — Ht 67.5 in | Wt 172.0 lb

## 2015-11-15 DIAGNOSIS — R110 Nausea: Secondary | ICD-10-CM

## 2015-11-15 DIAGNOSIS — R1013 Epigastric pain: Secondary | ICD-10-CM

## 2015-11-15 NOTE — Progress Notes (Signed)
No egg or soy allergy known to patient  No issues with past sedation with any surgeries  or procedures, no intubation problems-some nausea with sedation post op   No diet pills per patient  No home 02 use per patient   emmi video to     [email or web address] for egd and colon

## 2015-11-15 NOTE — Progress Notes (Signed)
Agree with Ms. Esterwood's assessment and plan. Carl E. Gessner, MD, FACG   

## 2015-11-16 DIAGNOSIS — H40003 Preglaucoma, unspecified, bilateral: Secondary | ICD-10-CM | POA: Diagnosis not present

## 2015-11-16 DIAGNOSIS — H2510 Age-related nuclear cataract, unspecified eye: Secondary | ICD-10-CM | POA: Diagnosis not present

## 2015-11-21 ENCOUNTER — Telehealth: Payer: Self-pay | Admitting: Internal Medicine

## 2015-11-21 ENCOUNTER — Encounter: Payer: Self-pay | Admitting: Internal Medicine

## 2015-11-21 NOTE — Telephone Encounter (Signed)
All questions answered about prep.  He has concerns about his blood sugar dropping while he is prepping for his procedure.  He is advised that he needs to drink plenty of fluids and that he needs to make sure he does not do all sugar free

## 2015-11-22 ENCOUNTER — Telehealth: Payer: Self-pay | Admitting: Internal Medicine

## 2015-11-22 NOTE — Telephone Encounter (Signed)
Patient reports that he is not able to tolerate the clear liquids.  He states that he has reactive hypoglycemia and needs protein.  He is concerned about "dying in his sleep".  Discussed with Dr. Carlean Purl .  Per Dr. Carlean Purl he can change to a flex sig if he prefers.  He can eat today and then do a fleet enema prep one hour prior to leaving for the procedure.  He reports that he wants to try and do the full colonoscopy and will let us know if he is not able to make it without eating.  He will notify the office tomorrow if he wants to change to a flex.

## 2015-11-23 ENCOUNTER — Ambulatory Visit (AMBULATORY_SURGERY_CENTER): Payer: Medicare Other | Admitting: Internal Medicine

## 2015-11-23 ENCOUNTER — Encounter: Payer: Self-pay | Admitting: Internal Medicine

## 2015-11-23 ENCOUNTER — Telehealth: Payer: Self-pay | Admitting: Internal Medicine

## 2015-11-23 VITALS — BP 109/76 | HR 71 | Temp 95.8°F | Resp 11 | Ht 67.5 in | Wt 172.0 lb

## 2015-11-23 DIAGNOSIS — K629 Disease of anus and rectum, unspecified: Secondary | ICD-10-CM | POA: Diagnosis not present

## 2015-11-23 DIAGNOSIS — R1013 Epigastric pain: Secondary | ICD-10-CM | POA: Diagnosis present

## 2015-11-23 DIAGNOSIS — K5730 Diverticulosis of large intestine without perforation or abscess without bleeding: Secondary | ICD-10-CM | POA: Diagnosis not present

## 2015-11-23 DIAGNOSIS — R110 Nausea: Secondary | ICD-10-CM

## 2015-11-23 LAB — GLUCOSE, CAPILLARY
Glucose-Capillary: 70 mg/dL (ref 65–99)
Glucose-Capillary: 95 mg/dL (ref 65–99)
Glucose-Capillary: 153 mg/dL — ABNORMAL HIGH (ref 65–99)

## 2015-11-23 MED ORDER — SODIUM CHLORIDE 0.9 % IV SOLN: 500.0000 mL | INTRAVENOUS | Status: DC

## 2015-11-23 MED ORDER — DEXTROSE 5 % IV SOLN: INTRAVENOUS | Status: DC

## 2015-11-23 NOTE — Progress Notes (Signed)
Patient denies any allergies to eggs or soy. 

## 2015-11-23 NOTE — Progress Notes (Signed)
Report to PACU, RN, vss, BBS= Clear.  

## 2015-11-23 NOTE — Op Note (Signed)
[place]  [address] [place], [address]   COLONOSCOPY PROCEDURE REPORT  PATIENT: [name], [name]  MR#: [id number] BIRTHDATE: 05-07-1946 , 78  yrs. old GENDER: male ENDOSCOPIST: Gatha Mayer, MD, [facility] PROCEDURE DATE:  11/23/2015 PROCEDURE:   Colonoscopy, diagnostic First Screening Colonoscopy - Avg.  risk and is 50 yrs.  old or older - No.  Prior Negative Screening - Now for repeat screening. N/A  History of Adenoma - Now for follow-up colonoscopy & has been > or = to 3 yrs.  N/A  Polyps removed today? No Recommend repeat exam, <10 yrs? No ASA CLASS:   Class II INDICATIONS:Patient is not applicable for Colorectal Neoplasm Risk Assessment for this procedure. MEDICATIONS: Propofol 40 mg IV and Monitored anesthesia care  DESCRIPTION OF PROCEDURE:   After the risks benefits and alternatives of the procedure were thoroughly explained, informed consent was obtained.  The digital rectal exam revealed no abnormalities of the rectum.   The LB [id number]  endoscope was introduced through the anus and advanced to the cecum, which was identified by both the appendix and ileocecal valve. No adverse events experienced.   The quality of the prep was excellent. (MiraLax was used)  The instrument was then slowly withdrawn as the colon was fully examined. Estimated blood loss is zero unless otherwise noted in this procedure report.      COLON FINDINGS: There was mild diverticulosis noted in the sigmoid colon.   The examination was otherwise normal.  Retroflexed views revealed no abnormalities. The time to cecum = 2.0 Withdrawal time = 8.2   The scope was withdrawn and the procedure completed. COMPLICATIONS: There were no immediate complications.  ENDOSCOPIC IMPRESSION: 1.   Mild diverticulosis was noted in the sigmoid colon 2.   The examination was otherwise normal  RECOMMENDATIONS: 1.  F/u PCP re: anxiety/depression, somatization - see psych  if willing. No specific GI tx at this time - the thickened rectum on CT I suspect was contracting muscle and not a pathologic abnormality. 2.  Routine repeat colonoscopy screening not necessary.  See me/GI as needed.  eSigned:  Gatha Mayer, MD, [facility] 11/23/2015 2:41 PM   cc: The Patient

## 2015-11-23 NOTE — Telephone Encounter (Signed)
Patient was concerned about his blood sugar dropping after the second prep.  I reassured him that he could eat some hard candy or drink some apple juice to keep it from dropping.  I advised him that as long as he cuts off anything liquid by mouth by 12:30 it would be fine.  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  If it is low, we can give him some dextrose (D5) through his I.V. And bring his blood sugar up.  He felt more at ease and all questions were answered.

## 2015-11-23 NOTE — Op Note (Signed)
[place]  [address]. [name], [address]   ENDOSCOPY PROCEDURE REPORT  PATIENT: [name], [name]  MR#: [id number] BIRTHDATE: 11/13/1945 , 43  yrs. old GENDER: male ENDOSCOPIST: Gatha Mayer, MD, [facility] PROCEDURE DATE:  11/23/2015 PROCEDURE:  EGD, diagnostic ASA CLASS:     Class II INDICATIONS:  epigastric pain. MEDICATIONS: Propofol 200 mg IV and Monitored anesthesia care TOPICAL ANESTHETIC: none  DESCRIPTION OF PROCEDURE: After the risks benefits and alternatives of the procedure were thoroughly explained, informed consent was obtained.  The LB [id number] endoscope was introduced through the mouth and advanced to the second portion of the duodenum , Without limitations.  The instrument was slowly withdrawn as the mucosa was fully examined.    Mild-moderate gastropathy in the antrum, intact fundoplication, otherwise normal.  Retroflexed views revealed no abnormalities. The scope was then withdrawn from the patient and the procedure completed.  COMPLICATIONS: There were no immediate complications.  ENDOSCOPIC IMPRESSION: Mild-moderate gastropathy in the antrum, intact fundoplication, otherwise normal  RECOMMENDATIONS: 1.  Proceed with a Colonoscopy. 2.  Continue mirtazipine, lorazepam, f/u PCP/psych (if willing) No new GI specific Tx   eSigned:  Gatha Mayer, MD, [facility] 11/23/2015 2:37 PM    CC:The Patient

## 2015-11-23 NOTE — Patient Instructions (Addendum)
Nothing bad here. You do not have any signs of cancer or serious problems in the gut.  I suspect anxiety and depression are having some role in how you feel and recommend continuing treatment of that.  I appreciate the opportunity to care for you. Gatha Mayer, MD, FACG   YOU HAD AN ENDOSCOPIC PROCEDURE TODAY AT [facility]:   Refer to the procedure report that was given to you for any specific questions about what was found during the examination.  If the procedure report does not answer your questions, please call your gastroenterologist to clarify.  If you requested that your care partner not be given the details of your procedure findings, then the procedure report has been included in a sealed envelope for you to review at your convenience later.  YOU SHOULD EXPECT: Some feelings of bloating in the abdomen. Passage of more gas than usual.  Walking can help get rid of the air that was put into your GI tract during the procedure and reduce the bloating. If you had a lower endoscopy (such as a colonoscopy or flexible sigmoidoscopy) you may notice spotting of blood in your stool or on the toilet paper. If you underwent a bowel prep for your procedure, you may not have a normal bowel movement for a few days.  Please Note:  You might notice some irritation and congestion in your nose or some drainage.  This is from the oxygen used during your procedure.  There is no need for concern and it should clear up in a day or so.  SYMPTOMS TO REPORT IMMEDIATELY:   Following lower endoscopy (colonoscopy or flexible sigmoidoscopy):  Excessive amounts of blood in the stool  Significant tenderness or worsening of abdominal pains  Swelling of the abdomen that is new, acute  Fever of 100F or higher   Following upper endoscopy (EGD)  Vomiting of blood or coffee ground material  New chest pain or pain under the shoulder blades  Painful or persistently difficult  swallowing  New shortness of breath  Fever of 100F or higher  Black, tarry-looking stools  For urgent or emergent issues, a gastroenterologist can be reached at any hour by calling [phone].   DIET: Your first meal following the procedure should be a small meal and then it is ok to progress to your normal diet. Heavy or fried foods are harder to digest and may make you feel nauseous or bloated.  Likewise, meals heavy in dairy and vegetables can increase bloating.  Drink plenty of fluids but you should avoid alcoholic beverages for 24 hours.  ACTIVITY:  You should plan to take it easy for the rest of today and you should NOT DRIVE or use heavy machinery until tomorrow (because of the sedation medicines used during the test).    FOLLOW UP: Our staff will call the number listed on your records the next business day following your procedure to check on you and address any questions or concerns that you may have regarding the information given to you following your procedure. If we do not reach you, we will leave a message.  However, if you are feeling well and you are not experiencing any problems, there is no need to return our call.  We will assume that you have returned to your regular daily activities without incident.  If any biopsies were taken you will be contacted by phone or by letter within the next 1-3 weeks.  Please call us at [phone] if you have not heard about the biopsies in 3 weeks.    SIGNATURES/CONFIDENTIALITY: You and/or your care partner have signed paperwork which will be entered into your electronic medical record.  These signatures attest to the fact that that the information above on your After Visit Summary has been reviewed and is understood.  Full responsibility of the confidentiality of this discharge information lies with you and/or your care-partner.

## 2015-11-24 ENCOUNTER — Telehealth: Payer: Self-pay

## 2015-11-24 NOTE — Telephone Encounter (Signed)
  Follow up Call-  Call back number 11/23/2015  Post procedure Call Back phone  # [phone]  Permission to leave phone message Yes    Patient was called for follow up after procedure on 11/23/2015. No answer at the number given. A message was left on the answering machine.

## 2015-11-29 ENCOUNTER — Encounter: Payer: Self-pay | Admitting: Internal Medicine

## 2015-12-05 ENCOUNTER — Encounter: Payer: Self-pay | Admitting: Internal Medicine

## 2015-12-13 ENCOUNTER — Ambulatory Visit: Payer: Self-pay | Admitting: Internal Medicine

## 2015-12-23 ENCOUNTER — Encounter: Payer: Self-pay | Admitting: Internal Medicine

## 2015-12-23 ENCOUNTER — Ambulatory Visit (INDEPENDENT_AMBULATORY_CARE_PROVIDER_SITE_OTHER): Payer: Medicare Other | Admitting: Internal Medicine

## 2015-12-23 VITALS — BP 130/80 | HR 78 | Temp 98.2°F | Ht 69.0 in | Wt 173.0 lb

## 2015-12-23 DIAGNOSIS — M25512 Pain in left shoulder: Secondary | ICD-10-CM | POA: Diagnosis not present

## 2015-12-23 DIAGNOSIS — F329 Major depressive disorder, single episode, unspecified: Secondary | ICD-10-CM | POA: Diagnosis not present

## 2015-12-23 DIAGNOSIS — M79604 Pain in right leg: Secondary | ICD-10-CM

## 2015-12-23 DIAGNOSIS — F32A Depression, unspecified: Secondary | ICD-10-CM

## 2015-12-23 MED ORDER — TRAMADOL HCL 50 MG PO TABS: 50.0000 mg | ORAL_TABLET | Freq: Four times a day (QID) | ORAL | Status: DC | PRN

## 2015-12-23 NOTE — Progress Notes (Signed)
Subjective:    Patient ID: Erik Lolling Sr., male    DOB: [birth year], 70 y.o.   MRN: [id number]  HPI  Here to f/u, has ongoing left shoulder and right knee pain for which he has seen ortho and rec'd for surgury but has put off; now since last seen Left shoulder pain more constant, mod more severe, asks for tramadol refill.  Even worse however is right pain, now intermittent but mod to severe with anbutlation, has some signs of impending giveaways but no falls; has not consented to surgury for this as well.  Does not know if he will this yr as well.  Also does mention pain to all joints and looseness, no swelling however, mostly to the bilat shoulders , lower back, hips and knees. Did go back to [facility] in recent wks, spends time in pool but not really swimming.  Sitting makes better.  Does not want further ortho eval for now  Or PT.  Does ask for right knee brace which he found out needed to be ordered per PCP.  Denies worsening depressive symptoms, suicidal ideation, or panic Past Medical History  Diagnosis Date  . Allergic rhinitis   . Rheumatoid arthritis(714.0)   . Insomnia   . Anxiety   . CAD (coronary artery disease)   . Diverticulitis   . GERD with stricture   . HLD (hyperlipidemia)   . HTN (hypertension)   . Interstitial cystitis   . IBS (irritable bowel syndrome)   . Anal fissure   . Depression   . Hiatal hernia   . Internal and external hemorrhoids without complication   . FIBROMYALGIA 06/09/2010    Qualifier: Diagnosis of  By: Carlean Purl MD, Dimas Millin Irritable bowel syndrome 06/09/2010    Qualifier: Diagnosis of  By: Carlean Purl MD, FACG, [place] CYSTITIS 06/09/2010    Qualifier: Diagnosis of  By: Carlean Purl MD, Dimas Millin Diverticulosis 12/18/2011  . 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 Helicobacter pylori (H. pylori) infection 11/25/2012    11/2012 EGD + gastric bxs  . Impotence of organic origin   . Coronary atherosclerosis of native coronary artery 2007   nonobstructive CAD by cath with 40% mid-LAD stenosis  . Hyperlipidemia   . Fibromyalgia   . Reactive hypoglycemia   . Allergy   . Cataract     starting   Past Surgical History  Procedure Laterality Date  . Nissen fundoplication  123XX123  . Tonsillectomy and adenoidectomy  1957  . Vasectomy    . Knee arthroscopy      right x 2  . Transurethral resection of prostate      x 2  . Bladder distention      x 6  . Shoulder arthroscopy  2010    left  . Colonoscopy  05/10/2006    diverticulosis, internal and external hemorrhoids  . Upper gastrointestinal endoscopy  05/13/2008    hiatal hernia  . Catheter removal      super pubic area  . Colonoscopy      reports that he has quit smoking. He has never used smokeless tobacco. He reports that he does not drink alcohol or use illicit drugs. family history includes Breast cancer in his sister; Colon polyps in his father; Diabetes in his father, other, and sister; Heart disease in his father and mother; Hypertension in his father and mother; Stroke in his mother. There is no history of Colon cancer, Esophageal cancer,  Rectal cancer, or Stomach cancer. Allergies  Allergen Reactions  . Pneumovax [Pneumococcal Polysaccharide Vaccine] Other (See Comments)    pain  . Prilosec [Omeprazole] Other (See Comments)    Per patient joint pain with PPI's  . Statins Other (See Comments)    Joint pain  . Tramadol Other (See Comments)   Current Outpatient Prescriptions on File Prior to Visit  Medication Sig Dispense Refill  . LORazepam (ATIVAN) 0.5 MG tablet Take 1 tablet (0.5 mg total) by mouth 2 (two) times daily as needed for anxiety. 60 tablet 2   No current facility-administered medications on file prior to visit.     Review of Systems  Constitutional: Negative for unusual diaphoresis or night sweats HENT: Negative for ringing in ear or discharge Eyes: Negative for double vision or worsening visual disturbance.  Respiratory: Negative for choking  and stridor.   Gastrointestinal: Negative for vomiting or other signifcant bowel change Genitourinary: Negative for hematuria or change in urine volume.  Musculoskeletal: Negative for other MSK pain or swelling Skin: Negative for color change and worsening wound.  Neurological: Negative for tremors and numbness other than noted  Psychiatric/Behavioral: Negative for decreased concentration or agitation other than above       Objective:   Physical Exam BP 130/80 mmHg  Pulse 78  Temp(Src) 98.2 F (36.8 C) (Oral)  Ht 5\' 9"  (1.753 m)  Wt 173 lb (78.472 kg)  BMI 25.54 kg/m2  SpO2 96% VS noted, not ill appearing Constitutional: Pt appears in no significant distress HENT: Head: NCAT.  Right Ear: External ear normal.  Left Ear: External ear normal.  Eyes: . Pupils are equal, round, and reactive to light. Conjunctivae and EOM are normal Neck: Normal range of motion. Neck supple.  Cardiovascular: Normal rate and regular rhythm.   Pulmonary/Chest: Effort normal and breath sounds without rales or wheezing.  Abd:  Soft, NT, ND, + BS Neurological: Pt is alert. Not confused , motor grossly intact Skin: Skin is warm. No rash, no LE edema Psychiatric: Pt behavior is normal. No agitation. but some irritable and minor argumentative, demanding MSK:  Left shoulder with minimal tender, right shoulder non tender but marked decresaed ROM on left, and right to 110 degrees abduction only; right knee with 1+ effusion, crepitus, decreased ROM, mild diffuse tender, grimaces and limps to walk    Assessment & Plan:

## 2015-12-23 NOTE — Progress Notes (Signed)
Pre visit review using our clinic review tool, if applicable. No additional management support is needed unless otherwise documented below in the visit note. 

## 2015-12-23 NOTE — Patient Instructions (Addendum)
Please continue all other medications as before, and refills have been done if requested - the tramadol  Please have the pharmacy call with any other refills you may need.  Please keep your appointments with your specialists as you may have planned  You are given the prescription for the right knee brace

## 2015-12-24 NOTE — Assessment & Plan Note (Signed)
stable overall by history and exam, recent data reviewed with pt, and pt to continue medical treatment as before,  to f/u any worsening symptoms or concerns Lab Results  Component Value Date   WBC 8.4 10/24/2015   HGB 16.7 10/24/2015   HCT 49.7 10/24/2015   PLT 344.0 10/24/2015   GLUCOSE 118* 10/24/2015   CHOL 221* 06/30/2015   TRIG 208.0* 06/30/2015   HDL 39.60 06/30/2015   LDLDIRECT 159.0 06/30/2015   ALT 18 10/24/2015   AST 17 10/24/2015   NA 140 10/24/2015   K 4.9 10/24/2015   CL 101 10/24/2015   CREATININE 1.30 10/24/2015   BUN 14 10/24/2015   CO2 31 10/24/2015   TSH 2.59 06/30/2015   PSA 1.12 06/30/2015

## 2015-12-24 NOTE — Assessment & Plan Note (Signed)
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

## 2015-12-24 NOTE — Assessment & Plan Note (Signed)
With mild worsening symtpoms, ok for tramadol prn refill, declines PT or ortho referrals at this time

## 2015-12-26 ENCOUNTER — Telehealth: Payer: Self-pay

## 2015-12-26 MED ORDER — SLIP-ON KNEE BRACE MISC: Status: DC

## 2015-12-26 NOTE — Telephone Encounter (Signed)
Faxed pt rx for a right knee brace to [facility].

## 2015-12-26 NOTE — Addendum Note (Signed)
Addended by: Aviva Signs M on: 12/26/2015 01:44 PM   Modules accepted: Orders

## 2016-01-03 DIAGNOSIS — M50221 Other cervical disc displacement at C4-C5 level: Secondary | ICD-10-CM | POA: Diagnosis not present

## 2016-01-03 DIAGNOSIS — M50222 Other cervical disc displacement at C5-C6 level: Secondary | ICD-10-CM | POA: Diagnosis not present

## 2016-01-03 DIAGNOSIS — M7502 Adhesive capsulitis of left shoulder: Secondary | ICD-10-CM | POA: Diagnosis not present

## 2016-01-03 DIAGNOSIS — M1711 Unilateral primary osteoarthritis, right knee: Secondary | ICD-10-CM | POA: Diagnosis not present

## 2016-01-12 DIAGNOSIS — Z6826 Body mass index (BMI) 26.0-26.9, adult: Secondary | ICD-10-CM | POA: Diagnosis not present

## 2016-01-12 DIAGNOSIS — Z205 Contact with and (suspected) exposure to viral hepatitis: Secondary | ICD-10-CM | POA: Diagnosis not present

## 2016-01-12 DIAGNOSIS — A6000 Herpesviral infection of urogenital system, unspecified: Secondary | ICD-10-CM | POA: Diagnosis not present

## 2016-02-03 DIAGNOSIS — R32 Unspecified urinary incontinence: Secondary | ICD-10-CM | POA: Diagnosis not present

## 2016-02-23 ENCOUNTER — Encounter: Payer: Self-pay | Admitting: Internal Medicine

## 2016-03-09 ENCOUNTER — Telehealth: Payer: Self-pay

## 2016-03-09 NOTE — Telephone Encounter (Signed)
Confirmed with patient he will be her Saturday.

## 2016-03-09 NOTE — Telephone Encounter (Signed)
Please advise 

## 2016-03-09 NOTE — Telephone Encounter (Signed)
I have scheduled patient for 10:15 tomorrow.  Tried to call patient but number busy

## 2016-03-09 NOTE — Telephone Encounter (Signed)
Patient states he believe he has a UTI and wants to come into the lab for a urine culture asap! Please advise

## 2016-03-09 NOTE — Telephone Encounter (Signed)
As urine culture take several days to complete, this is not usually the best first step  Please consider seeing [facility] in the AM (or UC today) if has higher fever, pain, blood, weakness or falls

## 2016-03-09 NOTE — Telephone Encounter (Signed)
[name], I dont have access to put patient on Saturday clinic do you mind giving him an appointment if we have one?

## 2016-03-10 ENCOUNTER — Encounter: Payer: Self-pay | Admitting: Family Medicine

## 2016-03-10 ENCOUNTER — Ambulatory Visit (INDEPENDENT_AMBULATORY_CARE_PROVIDER_SITE_OTHER): Payer: Medicare Other | Admitting: Family Medicine

## 2016-03-10 VITALS — BP 128/70 | HR 78 | Temp 97.4°F | Ht 69.0 in | Wt 173.0 lb

## 2016-03-10 DIAGNOSIS — N410 Acute prostatitis: Secondary | ICD-10-CM | POA: Diagnosis not present

## 2016-03-10 DIAGNOSIS — N3010 Interstitial cystitis (chronic) without hematuria: Secondary | ICD-10-CM | POA: Diagnosis not present

## 2016-03-10 DIAGNOSIS — R339 Retention of urine, unspecified: Secondary | ICD-10-CM | POA: Diagnosis not present

## 2016-03-10 DIAGNOSIS — R300 Dysuria: Secondary | ICD-10-CM

## 2016-03-10 LAB — POCT URINALYSIS DIPSTICK
Bilirubin, UA: NEGATIVE
Blood, UA: NEGATIVE
Glucose, UA: NEGATIVE
Ketones, UA: NEGATIVE
Leukocytes, UA: NEGATIVE
Nitrite, UA: NEGATIVE
Protein, UA: NEGATIVE
Spec Grav, UA: >=1.03
Urobilinogen, UA: 0.2
pH, UA: 6

## 2016-03-10 MED ORDER — SULFAMETHOXAZOLE-TRIMETHOPRIM 800-160 MG PO TABS: 1.0000 | ORAL_TABLET | Freq: Two times a day (BID) | ORAL | Status: DC

## 2016-03-10 MED ORDER — TAMSULOSIN HCL 0.4 MG PO CAPS: ORAL_CAPSULE | ORAL | Status: DC

## 2016-03-10 NOTE — Progress Notes (Signed)
OFFICE VISIT  03/10/2016   CC:  Chief Complaint  Patient presents with  . Urinary Tract Infection    c/o burning and frequency x 2 days    HPI:    Patient is a 70 y.o. Caucasian male who presents for urinary frequency, burning with urination, small voids that started 2 days ago.  Denies pain in prostate region.  Was on atarax right before this started and he noted it caused some signif urinary retention and he had to cath himself more frequently (hx of signif IC--sees [name]). No fevers.  No n/v.  No abd pain.  Past Medical History  Diagnosis Date  . Allergic rhinitis   . Rheumatoid arthritis(714.0)   . Insomnia   . Anxiety   . CAD (coronary artery disease)   . Diverticulitis   . GERD with stricture   . HLD (hyperlipidemia)   . HTN (hypertension)   . Interstitial cystitis   . IBS (irritable bowel syndrome)   . Anal fissure   . Depression   . Hiatal hernia   . Internal and external hemorrhoids without complication   . FIBROMYALGIA 06/09/2010    Qualifier: Diagnosis of  By: Carlean Purl MD, Dimas Millin Irritable bowel syndrome 06/09/2010    Qualifier: Diagnosis of  By: Carlean Purl MD, FACG, [place] CYSTITIS 06/09/2010    Qualifier: Diagnosis of  By: Carlean Purl MD, Dimas Millin Diverticulosis 12/18/2011  . 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 Helicobacter pylori (H. pylori) infection 11/25/2012    11/2012 EGD + gastric bxs  . Impotence of organic origin   . Coronary atherosclerosis of native coronary artery 2007    nonobstructive CAD by cath with 40% mid-LAD stenosis  . Hyperlipidemia   . Fibromyalgia   . Reactive hypoglycemia   . Allergy   . Cataract     starting    Past Surgical History  Procedure Laterality Date  . Nissen fundoplication  123XX123  . Tonsillectomy and adenoidectomy  1957  . Vasectomy    . Knee arthroscopy      right x 2  . Transurethral resection of prostate      x 2  . Bladder distention      x 6  . Shoulder arthroscopy  2010   left  . Colonoscopy  05/10/2006    diverticulosis, internal and external hemorrhoids  . Upper gastrointestinal endoscopy  05/13/2008    hiatal hernia  . Catheter removal      super pubic area  . Colonoscopy      Outpatient Prescriptions Prior to Visit  Medication Sig Dispense Refill  . Elastic Bandages & Supports (SLIP-ON KNEE BRACE) MISC Use daily for Right Knee Pain. 1 each 0  . LORazepam (ATIVAN) 0.5 MG tablet Take 1 tablet (0.5 mg total) by mouth 2 (two) times daily as needed for anxiety. 60 tablet 2  . traMADol (ULTRAM) 50 MG tablet Take 1 tablet (50 mg total) by mouth every 6 (six) hours as needed. 120 tablet 2   No facility-administered medications prior to visit.    Allergies  Allergen Reactions  . Pneumovax [Pneumococcal Polysaccharide Vaccine] Other (See Comments)    pain  . Prilosec [Omeprazole] Other (See Comments)    Per patient joint pain with PPI's  . Statins Other (See Comments)    Joint pain  . Tramadol Other (See Comments)    ROS As per HPI  PE: Blood pressure 128/70, pulse 78, temperature 97.4 F (  36.3 C), temperature source Oral, height 5\' 9"  (1.753 m), weight 173 lb (78.472 kg), SpO2 94 %. Gen: Alert, well appearing.  Patient is oriented to person, place, time, and situation. Rectal exam: negative without mass, lesions or tenderness, PROSTATE EXAM: smooth and symmetric without nodules or tenderness, normal in size, symmetrical, tenderness noted.  LABS:  CC UA today was completely normal.   Chemistry      Component Value Date/Time   NA 140 10/24/2015 1129   K 4.9 10/24/2015 1129   CL 101 10/24/2015 1129   CO2 31 10/24/2015 1129   BUN 14 10/24/2015 1129   CREATININE 1.30 10/24/2015 1129      Component Value Date/Time   CALCIUM 10.1 10/24/2015 1129   ALKPHOS 77 10/24/2015 1129   AST 17 10/24/2015 1129   ALT 18 10/24/2015 1129   BILITOT 0.6 10/24/2015 1129      IMPRESSION AND PLAN:  Acute prostatitis. This is superimposed/complicated by his  chronic interstitial cystitis. I'll treat with flomax short term to try to help diminish his retention, plus rx bactrim DS bid x 14d.  An After Visit Summary was printed and given to the patient.  FOLLOW UP: Return if symptoms worsen or fail to improve.

## 2016-03-10 NOTE — Progress Notes (Signed)
Pre visit review using our clinic review tool, if applicable. No additional management support is needed unless otherwise documented below in the visit note. 

## 2016-03-29 ENCOUNTER — Encounter: Payer: Self-pay | Admitting: Internal Medicine

## 2016-03-30 ENCOUNTER — Other Ambulatory Visit (INDEPENDENT_AMBULATORY_CARE_PROVIDER_SITE_OTHER): Payer: 59

## 2016-03-30 ENCOUNTER — Ambulatory Visit (INDEPENDENT_AMBULATORY_CARE_PROVIDER_SITE_OTHER): Payer: Medicare Other | Admitting: Internal Medicine

## 2016-03-30 ENCOUNTER — Telehealth: Payer: Self-pay

## 2016-03-30 ENCOUNTER — Encounter: Payer: Self-pay | Admitting: Internal Medicine

## 2016-03-30 VITALS — BP 136/82 | HR 74 | Temp 97.5°F | Resp 20 | Wt 172.0 lb

## 2016-03-30 DIAGNOSIS — Z0000 Encounter for general adult medical examination without abnormal findings: Secondary | ICD-10-CM

## 2016-03-30 DIAGNOSIS — Z0001 Encounter for general adult medical examination with abnormal findings: Secondary | ICD-10-CM

## 2016-03-30 DIAGNOSIS — E785 Hyperlipidemia, unspecified: Secondary | ICD-10-CM

## 2016-03-30 DIAGNOSIS — K580 Irritable bowel syndrome with diarrhea: Secondary | ICD-10-CM

## 2016-03-30 DIAGNOSIS — R6889 Other general symptoms and signs: Secondary | ICD-10-CM | POA: Diagnosis not present

## 2016-03-30 DIAGNOSIS — R739 Hyperglycemia, unspecified: Secondary | ICD-10-CM

## 2016-03-30 DIAGNOSIS — N138 Other obstructive and reflux uropathy: Secondary | ICD-10-CM

## 2016-03-30 DIAGNOSIS — N401 Enlarged prostate with lower urinary tract symptoms: Secondary | ICD-10-CM | POA: Insufficient documentation

## 2016-03-30 DIAGNOSIS — Z1159 Encounter for screening for other viral diseases: Secondary | ICD-10-CM | POA: Diagnosis not present

## 2016-03-30 LAB — BASIC METABOLIC PANEL
BUN: 18 mg/dL (ref 6–23)
CO2: 27 mEq/L (ref 19–32)
Calcium: 9.5 mg/dL (ref 8.4–10.5)
Chloride: 102 mEq/L (ref 96–112)
Creatinine, Ser: 1.14 mg/dL (ref 0.40–1.50)
GFR: 67.69 mL/min (ref >60.00)
Glucose, Bld: 98 mg/dL (ref 70–99)
Potassium: 4.1 mEq/L (ref 3.5–5.1)
Sodium: 137 mEq/L (ref 135–145)

## 2016-03-30 LAB — URINALYSIS, ROUTINE W REFLEX MICROSCOPIC
Bilirubin Urine: NEGATIVE
Leukocytes, UA: NEGATIVE
Nitrite: NEGATIVE
Specific Gravity, Urine: 1.015 (ref 1.000–1.030)
Total Protein, Urine: NEGATIVE
Urine Glucose: NEGATIVE
Urobilinogen, UA: 0.2 (ref 0.0–1.0)
pH: 6 (ref 5.0–8.0)

## 2016-03-30 LAB — CBC WITH DIFFERENTIAL/PLATELET
Basophils Absolute: 0.1 10*3/uL (ref 0.0–0.1)
Basophils Relative: 0.7 % (ref 0.0–3.0)
Eosinophils Absolute: 0.1 10*3/uL (ref 0.0–0.7)
Eosinophils Relative: 1.4 % (ref 0.0–5.0)
HCT: 46.9 % (ref 39.0–52.0)
Hemoglobin: 16 g/dL (ref 13.0–17.0)
Lymphocytes Relative: 36.7 % (ref 12.0–46.0)
Lymphs Abs: 3.1 10*3/uL (ref 0.7–4.0)
MCHC: 34 g/dL (ref 30.0–36.0)
MCV: 89.2 fl (ref 78.0–100.0)
Monocytes Absolute: 0.8 10*3/uL (ref 0.1–1.0)
Monocytes Relative: 10 % (ref 3.0–12.0)
Neutro Abs: 4.3 10*3/uL (ref 1.4–7.7)
Neutrophils Relative %: 51.2 % (ref 43.0–77.0)
Platelets: 298 10*3/uL (ref 150.0–400.0)
RBC: 5.26 Mil/uL (ref 4.22–5.81)
RDW: 12.8 % (ref 11.5–15.5)
WBC: 8.5 10*3/uL (ref 4.0–10.5)

## 2016-03-30 LAB — LIPID PANEL
Cholesterol: 240 mg/dL — ABNORMAL HIGH (ref 0–200)
HDL: 38.8 mg/dL — ABNORMAL LOW (ref >39.00)
LDL Cholesterol: 166 mg/dL — ABNORMAL HIGH (ref 0–99)
NonHDL: 201.34
Total CHOL/HDL Ratio: 6
Triglycerides: 178 mg/dL — ABNORMAL HIGH (ref 0.0–149.0)
VLDL: 35.6 mg/dL (ref 0.0–40.0)

## 2016-03-30 LAB — HEPATIC FUNCTION PANEL
ALT: 19 U/L (ref 0–53)
AST: 19 U/L (ref 0–37)
Albumin: 4.5 g/dL (ref 3.5–5.2)
Alkaline Phosphatase: 60 U/L (ref 39–117)
Bilirubin, Direct: 0.1 mg/dL (ref 0.0–0.3)
Total Bilirubin: 0.9 mg/dL (ref 0.2–1.2)
Total Protein: 7.1 g/dL (ref 6.0–8.3)

## 2016-03-30 LAB — HEMOGLOBIN A1C: Hgb A1c MFr Bld: 5.9 % (ref 4.6–6.5)

## 2016-03-30 LAB — TSH: TSH: 2.74 u[IU]/mL (ref 0.35–4.50)

## 2016-03-30 LAB — PSA: PSA: 0.71 ng/mL (ref 0.10–4.00)

## 2016-03-30 MED ORDER — TADALAFIL 5 MG PO TABS
5.0000 mg | ORAL_TABLET | Freq: Every day | ORAL | Status: DC | PRN
Start: 2016-03-30 — End: 2016-04-03

## 2016-03-30 NOTE — Telephone Encounter (Signed)
Orders placed.

## 2016-03-30 NOTE — Patient Instructions (Signed)
Please take all new medication as prescribed - the cialis  OK to take the immodium OTC as needed  Please continue all other medications as before, and refills have been done if requested.  Please have the pharmacy call with any other refills you may need.  Please continue your efforts at being more active, low cholesterol diet, and weight control.  You are otherwise up to date with prevention measures today.  Please keep your appointments with your specialists as you may have planned  Your lab work was done this morning  You will be contacted by phone if any changes need to be made immediately.  Otherwise, you will receive a letter about your results with an explanation, but please check with MyChart first.  Please remember to sign up for MyChart if you have not done so, as this will be important to you in the future with finding out test results, communicating by private email, and scheduling acute appointments online when needed.  Please return in 6 months, or sooner if needed

## 2016-03-30 NOTE — Progress Notes (Signed)
Pre visit review using our clinic review tool, if applicable. No additional management support is needed unless otherwise documented below in the visit note. 

## 2016-03-30 NOTE — Progress Notes (Signed)
Subjective:    Patient ID: Erik Lolling Sr., male    DOB: 01/09/1946, 70 y.o.   MRN: [id number]  HPI  Here for wellness and f/u;  Overall doing ok;  Pt denies Chest pain, worsening SOB, DOE, wheezing, orthopnea, PND, worsening LE edema, palpitations, dizziness or syncope.  Pt denies neurological change such as new headache, facial or extremity weakness.  Pt denies polydipsia, polyuria, or low sugar symptoms. Pt states overall good compliance with treatment and medications, good tolerability, and has been trying to follow appropriate diet.  Pt denies worsening depressive symptoms, suicidal ideation or panic. No fever, night sweats, wt loss, loss of appetite, or other constitutional symptoms.  Pt states good ability with ADL's, has low fall risk, home safety reviewed and adequate, no other significant changes in hearing or vision, and only occasionally active with exercise. Declines prevnar today due to pneumovax reaction. Denies worsening reflux, abd pain, dysphagia, n/v, bowel change or blood except has episodes qod diarrhea assoc sometimes with dizzy spells, no falls or ams/loc; no abd pain, fever, blood; last colonscopy with diverticulosis jan 2017.   Still with nocturia up to 5 times per night, could not tolerate flomax. Past Medical History  Diagnosis Date  . Allergic rhinitis   . Rheumatoid arthritis(714.0)   . Insomnia   . Anxiety   . CAD (coronary artery disease)   . Diverticulitis   . GERD with stricture   . HLD (hyperlipidemia)   . HTN (hypertension)   . Interstitial cystitis   . IBS (irritable bowel syndrome)   . Anal fissure   . Depression   . Hiatal hernia   . Internal and external hemorrhoids without complication   . FIBROMYALGIA 06/09/2010    Qualifier: Diagnosis of  By: Carlean Purl MD, Dimas Millin Irritable bowel syndrome 06/09/2010    Qualifier: Diagnosis of  By: Carlean Purl MD, FACG, [place] CYSTITIS 06/09/2010    Qualifier: Diagnosis of  By: Carlean Purl MD, Dimas Millin Diverticulosis 12/18/2011  . 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 Helicobacter pylori (H. pylori) infection 11/25/2012    11/2012 EGD + gastric bxs  . Impotence of organic origin   . Coronary atherosclerosis of native coronary artery 2007    nonobstructive CAD by cath with 40% mid-LAD stenosis  . Hyperlipidemia   . Fibromyalgia   . Reactive hypoglycemia   . Allergy   . Cataract     starting   Past Surgical History  Procedure Laterality Date  . Nissen fundoplication  123XX123  . Tonsillectomy and adenoidectomy  1957  . Vasectomy    . Knee arthroscopy      right x 2  . Transurethral resection of prostate      x 2  . Bladder distention      x 6  . Shoulder arthroscopy  2010    left  . Colonoscopy  05/10/2006    diverticulosis, internal and external hemorrhoids  . Upper gastrointestinal endoscopy  05/13/2008    hiatal hernia  . Catheter removal      super pubic area  . Colonoscopy      reports that he has quit smoking. He has never used smokeless tobacco. He reports that he does not drink alcohol or use illicit drugs. family history includes Breast cancer in his sister; Colon polyps in his father; Diabetes in his father, other, and sister; Heart disease in his father and mother; Hypertension in his father and  mother; Stroke in his mother. There is no history of Colon cancer, Esophageal cancer, Rectal cancer, or Stomach cancer. Allergies  Allergen Reactions  . Pneumovax [Pneumococcal Polysaccharide Vaccine] Other (See Comments)    pain  . Prilosec [Omeprazole] Other (See Comments)    Per patient joint pain with PPI's  . Statins Other (See Comments)    Joint pain  . Tramadol Other (See Comments)   Current Outpatient Prescriptions on File Prior to Visit  Medication Sig Dispense Refill  . LORazepam (ATIVAN) 0.5 MG tablet Take 1 tablet (0.5 mg total) by mouth 2 (two) times daily as needed for anxiety. 60 tablet 2  . traMADol (ULTRAM) 50 MG tablet Take 1 tablet (50 mg total) by  mouth every 6 (six) hours as needed. 120 tablet 2   No current facility-administered medications on file prior to visit.     Review of Systems Constitutional: Negative for increased diaphoresis, or other activity, appetite or siginficant weight change other than noted HENT: Negative for worsening hearing loss, ear pain, facial swelling, mouth sores and neck stiffness.   Eyes: Negative for other worsening pain, redness or visual disturbance.  Respiratory: Negative for choking or stridor Cardiovascular: Negative for other chest pain and palpitations.  Gastrointestinal: Negative for worsening diarrhea, blood in stool, or abdominal distention Genitourinary: Negative for hematuria, flank pain or change in urine volume.  Musculoskeletal: Negative for myalgias or other joint complaints.  Skin: Negative for other color change and wound or drainage.  Neurological: Negative for syncope and numbness. other than noted Hematological: Negative for adenopathy. or other swelling Psychiatric/Behavioral: Negative for hallucinations, SI, self-injury, decreased concentration or other worsening agitation.      Objective:   Physical Exam BP 136/82 mmHg  Pulse 74  Temp(Src) 97.5 F (36.4 C) (Oral)  Resp 20  Wt 172 lb (78.019 kg)  SpO2 95% /VS noted,  Constitutional: Pt is oriented to person, place, and time. Appears well-developed and well-nourished, in no significant distress Head: Normocephalic and atraumatic  Eyes: Conjunctivae and EOM are normal. Pupils are equal, round, and reactive to light Right Ear: External ear normal.  Left Ear: External ear normal Nose: Nose normal.  Mouth/Throat: Oropharynx is clear and moist  Neck: Normal range of motion. Neck supple. No JVD present. No tracheal deviation present or significant neck LA or mass Cardiovascular: Normal rate, regular rhythm, normal heart sounds and intact distal pulses.   Pulmonary/Chest: Effort normal and breath sounds without rales or  wheezing  Abdominal: Soft. Bowel sounds are normal. NT. No HSM  Musculoskeletal: Normal range of motion. Exhibits no edema Lymphadenopathy: Has no cervical adenopathy.  Neurological: Pt is alert and oriented to person, place, and time. Pt has normal reflexes. No cranial nerve deficit. Motor grossly intact Skin: Skin is warm and dry. No rash noted or new ulcers Psychiatric:  Has normal mood and affect. Behavior is normal.     Assessment & Plan:

## 2016-03-31 NOTE — Assessment & Plan Note (Signed)
stable overall by history and exam, recent data reviewed with pt, and pt to continue medical treatment as before,  to f/u any worsening symptoms or concerns Lab Results  Component Value Date   LDLCALC 166* 03/30/2016

## 2016-03-31 NOTE — Assessment & Plan Note (Signed)

## 2016-03-31 NOTE — Assessment & Plan Note (Signed)
Ok for cialis 5 mg daily as unable to tolerate flomax,  to f/u any worsening symptoms or concerns

## 2016-03-31 NOTE — Assessment & Plan Note (Signed)
stable overall by history and exam, recent data reviewed with pt, and pt to continue medical treatment as before,  to f/u any worsening symptoms or concerns Lab Results  Component Value Date   HGBA1C 5.9 03/30/2016

## 2016-03-31 NOTE — Assessment & Plan Note (Signed)
Exam benign, for immodium prn,  to f/u any worsening symptoms or concerns

## 2016-04-01 ENCOUNTER — Encounter: Payer: Self-pay | Admitting: Internal Medicine

## 2016-04-03 ENCOUNTER — Other Ambulatory Visit: Payer: Self-pay

## 2016-04-03 DIAGNOSIS — N3010 Interstitial cystitis (chronic) without hematuria: Secondary | ICD-10-CM | POA: Diagnosis not present

## 2016-04-03 DIAGNOSIS — R3915 Urgency of urination: Secondary | ICD-10-CM | POA: Diagnosis not present

## 2016-04-03 DIAGNOSIS — N401 Enlarged prostate with lower urinary tract symptoms: Secondary | ICD-10-CM | POA: Diagnosis not present

## 2016-04-03 MED ORDER — TADALAFIL 5 MG PO TABS: 5.0000 mg | ORAL_TABLET | Freq: Every day | ORAL | Status: DC | PRN

## 2016-04-09 ENCOUNTER — Encounter: Payer: Self-pay | Admitting: Internal Medicine

## 2016-04-10 DIAGNOSIS — N189 Chronic kidney disease, unspecified: Secondary | ICD-10-CM | POA: Diagnosis not present

## 2016-04-10 DIAGNOSIS — R3915 Urgency of urination: Secondary | ICD-10-CM | POA: Diagnosis not present

## 2016-04-10 DIAGNOSIS — N401 Enlarged prostate with lower urinary tract symptoms: Secondary | ICD-10-CM | POA: Diagnosis not present

## 2016-04-10 DIAGNOSIS — N3010 Interstitial cystitis (chronic) without hematuria: Secondary | ICD-10-CM | POA: Diagnosis not present

## 2016-04-10 NOTE — Telephone Encounter (Signed)
Patient scheduled for an office visit for tomorrow am.  I left him a voicemail with the details.  He is asked to call back to confirm appt.

## 2016-04-11 ENCOUNTER — Ambulatory Visit: Payer: Self-pay | Admitting: Internal Medicine

## 2016-04-12 DIAGNOSIS — M179 Osteoarthritis of knee, unspecified: Secondary | ICD-10-CM | POA: Diagnosis not present

## 2016-04-16 ENCOUNTER — Encounter: Payer: Self-pay | Admitting: Internal Medicine

## 2016-05-07 ENCOUNTER — Other Ambulatory Visit: Payer: Self-pay | Admitting: Internal Medicine

## 2016-05-08 NOTE — Telephone Encounter (Signed)
Medication refill sent to pharmacy  

## 2016-05-08 NOTE — Telephone Encounter (Signed)
Xanax, ambien Done hardcopy to [facility]

## 2016-05-28 DIAGNOSIS — R7303 Prediabetes: Secondary | ICD-10-CM | POA: Diagnosis not present

## 2016-05-28 DIAGNOSIS — G4700 Insomnia, unspecified: Secondary | ICD-10-CM | POA: Diagnosis not present

## 2016-06-18 ENCOUNTER — Ambulatory Visit (INDEPENDENT_AMBULATORY_CARE_PROVIDER_SITE_OTHER): Payer: Medicare Other

## 2016-06-18 VITALS — BP 130/80 | Ht 67.0 in | Wt 172.8 lb

## 2016-06-18 DIAGNOSIS — Z0000 Encounter for general adult medical examination without abnormal findings: Secondary | ICD-10-CM

## 2016-06-18 NOTE — Patient Instructions (Addendum)
[name] , Thank you for taking time to come for your Medicare Wellness Visit. I appreciate your ongoing commitment to your health goals. Please review the following plan we discussed and let me know if I can assist you in the future.  [facility] [place] / number to call for free hearing aid  Medicare does pay for a hearing screen  No reviews  [facility]  [phone]  [phone]   Can draw a titer to see if you had chickenpox;  Zostavax; Educated to check with insurance regarding coverage of Shingles vaccination on Part D or Part B and may have lower co-pay if provided on the Part D side   These are the goals we discussed: Goals    . Exercise 150 minutes per week (moderate activity)          In general to be more active; Getting his sleep now and having energy;  Has been in aquatics in the past and may try again.  Start date Sept 1        This is a list of the screening recommended for you and due dates:  Health Maintenance  Topic Date Due  . Shingles Vaccine  03/13/2007  . Pneumonia vaccines (2 of 2 - PCV13) 07/03/2014  . Flu Shot  08/15/2016*  . Tetanus Vaccine  11/12/2020  . Colon Cancer Screening  11/22/2025  .  Hepatitis C: One time screening is recommended by Center for Disease Control  (CDC) for  adults born from [birth year] through 1964.   Addressed  *Topic was postponed. The date shown is not the original due date.       Fall Prevention in the Home  Falls can cause injuries. They can happen to people of all ages. There are many things you can do to make your home safe and to help prevent falls.  WHAT CAN I DO ON THE OUTSIDE OF MY HOME?  Regularly fix the edges of walkways and driveways and fix any cracks.  Remove anything that might make you trip as you walk through a door, such as a raised step or threshold.  Trim any bushes or trees on the path to your home.  Use bright outdoor lighting.  Clear any walking paths  of anything that might make someone trip, such as rocks or tools.  Regularly check to see if handrails are loose or broken. Make sure that both sides of any steps have handrails.  Any raised decks and porches should have guardrails on the edges.  Have any leaves, snow, or ice cleared regularly.  Use sand or salt on walking paths during winter.  Clean up any spills in your garage right away. This includes oil or grease spills. WHAT CAN I DO IN THE BATHROOM?   Use night lights.  Install grab bars by the toilet and in the tub and shower. Do not use towel bars as grab bars.  Use non-skid mats or decals in the tub or shower.  If you need to sit down in the shower, use a plastic, non-slip stool.  Keep the floor dry. Clean up any water that spills on the floor as soon as it happens.  Remove soap buildup in the tub or shower regularly.  Attach bath mats securely with double-sided non-slip rug tape.  Do not have throw rugs and other things on the floor that can make you trip. WHAT CAN I DO IN THE BEDROOM?  Use  night lights.  Make sure that you have a light by your bed that is easy to reach.  Do not use any sheets or blankets that are too big for your bed. They should not hang down onto the floor.  Have a firm chair that has side arms. You can use this for support while you get dressed.  Do not have throw rugs and other things on the floor that can make you trip. WHAT CAN I DO IN THE KITCHEN?  Clean up any spills right away.  Avoid walking on wet floors.  Keep items that you use a lot in easy-to-reach places.  If you need to reach something above you, use a strong step stool that has a grab bar.  Keep electrical cords out of the way.  Do not use floor polish or wax that makes floors slippery. If you must use wax, use non-skid floor wax.  Do not have throw rugs and other things on the floor that can make you trip. WHAT CAN I DO WITH MY STAIRS?  Do not leave any items on  the stairs.  Make sure that there are handrails on both sides of the stairs and use them. Fix handrails that are broken or loose. Make sure that handrails are as long as the stairways.  Check any carpeting to make sure that it is firmly attached to the stairs. Fix any carpet that is loose or worn.  Avoid having throw rugs at the top or bottom of the stairs. If you do have throw rugs, attach them to the floor with carpet tape.  Make sure that you have a light switch at the top of the stairs and the bottom of the stairs. If you do not have them, ask someone to add them for you. WHAT ELSE CAN I DO TO HELP PREVENT FALLS?  Wear shoes that:  Do not have high heels.  Have rubber bottoms.  Are comfortable and fit you well.  Are closed at the toe. Do not wear sandals.  If you use a stepladder:  Make sure that it is fully opened. Do not climb a closed stepladder.  Make sure that both sides of the stepladder are locked into place.  Ask someone to hold it for you, if possible.  Clearly mark and make sure that you can see:  Any grab bars or handrails.  First and last steps.  Where the edge of each step is.  Use tools that help you move around (mobility aids) if they are needed. These include:  Canes.  Walkers.  Scooters.  Crutches.  Turn on the lights when you go into a dark area. Replace any light bulbs as soon as they burn out.  Set up your furniture so you have a clear path. Avoid moving your furniture around.  If any of your floors are uneven, fix them.  If there are any pets around you, be aware of where they are.  Review your medicines with your doctor. Some medicines can make you feel dizzy. This can increase your chance of falling. Ask your doctor what other things that you can do to help prevent falls.   This information is not intended to replace advice given to you by your health care provider. Make sure you discuss any questions you have with your health care  provider.   Document Released: 08/25/2009 Document Revised: 03/15/2015 Document Reviewed: 12/03/2014 Elsevier Interactive Patient Education 2016 [place] Maintenance, Male A healthy lifestyle and preventative care  can promote health and wellness.  Maintain regular health, dental, and eye exams.  Eat a healthy diet. Foods like vegetables, fruits, whole grains, low-fat dairy products, and lean protein foods contain the nutrients you need and are low in calories. Decrease your intake of foods high in solid fats, added sugars, and salt. Get information about a proper diet from your health care provider, if necessary.  Regular physical exercise is one of the most important things you can do for your health. Most adults should get at least 150 minutes of moderate-intensity exercise (any activity that increases your heart rate and causes you to sweat) each week. In addition, most adults need muscle-strengthening exercises on 2 or more days a week.   Maintain a healthy weight. The body mass index (BMI) is a screening tool to identify possible weight problems. It provides an estimate of body fat based on height and weight. Your health care provider can find your BMI and can help you achieve or maintain a healthy weight. For males 20 years and older:  A BMI below 18.5 is considered underweight.  A BMI of 18.5 to 24.9 is normal.  A BMI of 25 to 29.9 is considered overweight.  A BMI of 30 and above is considered obese.  Maintain normal blood lipids and cholesterol by exercising and minimizing your intake of saturated fat. Eat a balanced diet with plenty of fruits and vegetables. Blood tests for lipids and cholesterol should begin at age 61 and be repeated every 5 years. If your lipid or cholesterol levels are high, you are over age 91, or you are at high risk for heart disease, you may need your cholesterol levels checked more frequently.Ongoing high lipid and cholesterol levels should be  treated with medicines if diet and exercise are not working.  If you smoke, find out from your health care provider how to quit. If you do not use tobacco, do not start.  Lung cancer screening is recommended for adults aged 73-80 years who are at high risk for developing lung cancer because of a history of smoking. A yearly low-dose CT scan of the lungs is recommended for people who have at least a 30-pack-year history of smoking and are current smokers or have quit within the past 15 years. A pack year of smoking is smoking an average of 1 pack of cigarettes a day for 1 year (for example, a 30-pack-year history of smoking could mean smoking 1 pack a day for 30 years or 2 packs a day for 15 years). Yearly screening should continue until the smoker has stopped smoking for at least 15 years. Yearly screening should be stopped for people who develop a health problem that would prevent them from having lung cancer treatment.  If you choose to drink alcohol, do not have more than 2 drinks per day. One drink is considered to be 12 oz (360 mL) of beer, 5 oz (150 mL) of wine, or 1.5 oz (45 mL) of liquor.  Avoid the use of street drugs. Do not share needles with anyone. Ask for help if you need support or instructions about stopping the use of drugs.  High blood pressure causes heart disease and increases the risk of stroke. High blood pressure is more likely to develop in:  People who have blood pressure in the end of the normal range (100-139/85-89 mm Hg).  People who are overweight or obese.  People who are African American.  If you are 81-61 years of age, have your  blood pressure checked every 3-5 years. If you are 66 years of age or older, have your blood pressure checked every year. You should have your blood pressure measured twice--once when you are at a hospital or clinic, and once when you are not at a hospital or clinic. Record the average of the two measurements. To check your blood pressure  when you are not at a hospital or clinic, you can use:  An automated blood pressure machine at a pharmacy.  A home blood pressure monitor.  If you are 32-56 years old, ask your health care provider if you should take aspirin to prevent heart disease.  Diabetes screening involves taking a blood sample to check your fasting blood sugar level. This should be done once every 3 years after age 10 if you are at a normal weight and without risk factors for diabetes. Testing should be considered at a younger age or be carried out more frequently if you are overweight and have at least 1 risk factor for diabetes.  Colorectal cancer can be detected and often prevented. Most routine colorectal cancer screening begins at the age of 36 and continues through age 49. However, your health care provider may recommend screening at an earlier age if you have risk factors for colon cancer. On a yearly basis, your health care provider may provide home test kits to check for hidden blood in the stool. A small camera at the end of a tube may be used to directly examine the colon (sigmoidoscopy or colonoscopy) to detect the earliest forms of colorectal cancer. Talk to your health care provider about this at age 4 when routine screening begins. A direct exam of the colon should be repeated every 5-10 years through age 69, unless early forms of precancerous polyps or small growths are found.  People who are at an increased risk for hepatitis B should be screened for this virus. You are considered at high risk for hepatitis B if:  You were born in a country where hepatitis B occurs often. Talk with your health care provider about which countries are considered high risk.  Your parents were born in a high-risk country and you have not received a shot to protect against hepatitis B (hepatitis B vaccine).  You have HIV or AIDS.  You use needles to inject street drugs.  You live with, or have sex with, someone who has  hepatitis B.  You are a man who has sex with other men (MSM).  You get hemodialysis treatment.  You take certain medicines for conditions like cancer, organ transplantation, and autoimmune conditions.  Hepatitis C blood testing is recommended for all people born from [birth year] through 1964 and any individual with known risk factors for hepatitis C.  Healthy men should no longer receive prostate-specific antigen (PSA) blood tests as part of routine cancer screening. Talk to your health care provider about prostate cancer screening.  Testicular cancer screening is not recommended for adolescents or adult males who have no symptoms. Screening includes self-exam, a health care provider exam, and other screening tests. Consult with your health care provider about any symptoms you have or any concerns you have about testicular cancer.  Practice safe sex. Use condoms and avoid high-risk sexual practices to reduce the spread of sexually transmitted infections (STIs).  You should be screened for STIs, including gonorrhea and chlamydia if:  You are sexually active and are younger than 24 years.  You are older than 24 years, and your health care  provider tells you that you are at risk for this type of infection.  Your sexual activity has changed since you were last screened, and you are at an increased risk for chlamydia or gonorrhea. Ask your health care provider if you are at risk.  If you are at risk of being infected with HIV, it is recommended that you take a prescription medicine daily to prevent HIV infection. This is called pre-exposure prophylaxis (PrEP). You are considered at risk if:  You are a man who has sex with other men (MSM).  You are a heterosexual man who is sexually active with multiple partners.  You take drugs by injection.  You are sexually active with a partner who has HIV.  Talk with your health care provider about whether you are at high risk of being infected with HIV. If  you choose to begin PrEP, you should first be tested for HIV. You should then be tested every 3 months for as long as you are taking PrEP.  Use sunscreen. Apply sunscreen liberally and repeatedly throughout the day. You should seek shade when your shadow is shorter than you. Protect yourself by wearing long sleeves, pants, a wide-brimmed hat, and sunglasses year round whenever you are outdoors.  Tell your health care provider of new moles or changes in moles, especially if there is a change in shape or color. Also, tell your health care provider if a mole is larger than the size of a pencil eraser.  A one-time screening for abdominal aortic aneurysm (AAA) and surgical repair of large AAAs by ultrasound is recommended for men aged 32-75 years who are current or former smokers.  Stay current with your vaccines (immunizations).   This information is not intended to replace advice given to you by your health care provider. Make sure you discuss any questions you have with your health care provider.   Document Released: 04/26/2008 Document Revised: 11/19/2014 Document Reviewed: 03/26/2011 Elsevier Interactive Patient Education 2016 [employer].   Hearing Loss Hearing loss is a partial or total loss of the ability to hear. This can be temporary or permanent, and it can happen in one or both ears. Hearing loss may be referred to as deafness. Medical care is necessary to treat hearing loss properly and to prevent the condition from getting worse. Your hearing may partially or completely come back, depending on what caused your hearing loss and how severe it is. In some cases, hearing loss is permanent. CAUSES Common causes of hearing loss include:   Too much wax in the ear canal.   Infection of the ear canal or middle ear.   Fluid in the middle ear.   Injury to the ear or surrounding area.   An object stuck in the ear.   Prolonged exposure to loud sounds, such as music.  Less common  causes of hearing loss include:   Tumors in the ear.   Viral or bacterial infections, such as meningitis.   A hole in the eardrum (perforated eardrum).  Problems with the hearing nerve that sends signals between the brain and the ear.  Certain medicines.  SYMPTOMS  Symptoms of this condition may include:  Difficulty telling the difference between sounds.  Difficulty following a conversation when there is background noise.  Lack of response to sounds in your environment. This may be most noticeable when you do not respond to startling sounds.  Needing to turn up the volume on the television, radio, etc.  Ringing in the ears.  Dizziness.  Pain in the ears. DIAGNOSIS This condition is diagnosed based on a physical exam and a hearing test (audiometry). The audiometry test will be performed by a hearing specialist (audiologist). You may also be referred to an ear, nose, and throat (ENT) specialist (otolaryngologist).  TREATMENT Treatment for recent onset of hearing loss may include:   Ear wax removal.   Being prescribed medicines to prevent infection (antibiotics).   Being prescribed medicines to reduce inflammation (corticosteroids).  HOME CARE INSTRUCTIONS  If you were prescribed an antibiotic medicine, take it as told by your health care provider. Do not stop taking the antibiotic even if you start to feel better.  Take over-the-counter and prescription medicines only as told by your health care provider.  Avoid loud noises.   Return to your normal activities as told by your health care provider. Ask your health care provider what activities are safe for you.  Keep all follow-up visits as told by your health care provider. This is important. SEEK MEDICAL CARE IF:   You feel dizzy.   You develop new symptoms.   You vomit or feel nauseous.   You have a fever.  SEEK IMMEDIATE MEDICAL CARE IF:  You develop sudden changes in your vision.   You have  severe ear pain.   You have new or increased weakness.  You have a severe headache.   This information is not intended to replace advice given to you by your health care provider. Make sure you discuss any questions you have with your health care provider.   Document Released: 10/29/2005 Document Revised: 07/20/2015 Document Reviewed: 03/16/2015 Elsevier Interactive Patient Education [employer].

## 2016-06-18 NOTE — Progress Notes (Addendum)
Subjective:   Erik Lolling Sr. is a 70 y.o. male who presents for Medicare Annual/Subsequent preventive examination.  HRA assessment completed during this visit with [name]  The Patient was informed that the wellness visit is to identify future health risk and educate and initiate measures that can reduce risk for increased disease through the lifespan.    Describes health as Fair to poor due to multiple medical conditions  Issues ongoing with Interstitial cystitis; frequency; which was the primary reason he retired  Has tried all meds and procedures  UA; [name]  Joint pain; GI issues   NO ROS; Medicare Wellness Visit Last OV:  03/2015 To note preop review for shoulder surgery  Labs completed:  Hep c drawn 03/2016/ A1c 5.9; chol 240; HDL 38; LDL 166;  Hyperlipidemia Trig 178;  Careful regarding diet due to hypoglycemia  Knows when he is hypoglycemic and takes meds accordingly  HTN /BP  good today   Psychosocial: lives with spouse Children; 4 children;  56; 22; 71; 73 Former smoker; quit 1980/ 70 yo ETOH; has not drank any ETOH in 30 years   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;   Medications reviewed for issues; compliance; otc meds Very few meds; has learned to sleep without Lorazepam  4/29;17 recently tx with sulfamethoxazole Trimethoprim for prostatitis and flomax short term ; Appears he is on Ditropan xl 10mg  per [name] care  Other meds from other facilities not listed; followed by [name]  See Care everywhere   BMI: 26;   Diet;   Snacks  between meals at times  BS recently dropped due to change in meds He can tell when it's low; irritable, weak and shaky Has vegetables; some fruit Some snacks;   Carries candy for possible hypoglycemia   Nutritional counseling given:  Teeth or Denture issues?   Gets dental care regular  Exercise;    out and walking some; Knees cartilage is going out; just had injection which helped  Typical  day; varies; usually not that active  Likes to camp and travel; Agrees to start [name] class in September when kids go  Back to school at the [employer] reviewed for short term and long term;  May scale down and move rental home;  Rental home is close to his work garage; He would rent the primary home  2 level rental as well ; needs to remodel for bathroom; may have to build on  Personal safety issues reviewed for risk such as safe community; smoke Secondary school teacher; firearms safety if applicable; protection when in the sun (hasn't been out  much ; driving safety for seniors or any recent accidents. No accidents;  Fall hx;  Careful with right knee as it will lock up No falls;   BOWEL incontinence; concerned with cramping and loose stools and freq Followed by gastro   Functional losses from last year to this year? More active last year The knee / cortisone shots that have helped  Given education on "Fall Prevention in the Home" for more safety tips the patient can apply as appropriate.   Risk for Depression reviewed: Any emotional problems? Anxious, depressed, irritable, sad or blue? no Denies feeling depressed or hopeless; voices pleasure in daily life  Sleep: has been sleeping well;  Very careful about thought process and activity prior to bed    Cognitive; memory; recall is good  Concerned with lorazepam;  Manages checkbook, medications; no  failures of task Ad8 score reviewed for issues;  Issues making decisions; no  Less interest in hobbies / activities" no  Repeats questions, stories; family complaining: NO  Trouble using ordinary gadgets; microwave; computer: no  Forgets the month or year: no  Mismanaging finances: no  Missing apt: no but does write them down  Daily problems with thinking of memory NO Ad8 score is 0  Excelled in each position he had prior to retiring  Helps many people at [employer];  Has to be mentally strong   Advanced Directive addressed:  working on this at home  [employer] offers free advance directive forms, as well as assistance in completing the forms themselves. For assistance, contact the Spiritual Care Department at [phone], or the Clinical Social Work Department at [phone].  Counseling; Educated to check with insurance regarding coverage of Shingles vaccination on Part D or Part B and may have lower co-pay if provided on the Part D side  Prevnar 13/ stated he had a very bad reaction to the PSV 23;  Declines any other pneumonia vaccines; postponed today  Hearing: explained hearing benefit per medicare if needed. Free hearing aid with Division of Hard of Hearing   Ophthalmology exam every year  Small cataracts currently   Established and updated Risk reviewed and appropriate referral made or health recommendations:  Current Care Team reviewed and updated [name];  [name] Other Tertiary care centers        Objective:    Vitals: BP 130/80   Ht 5\' 7"  (1.702 m)   Wt 172 lb 12 oz (78.4 kg)   BMI 27.06 kg/m   Body mass index is 27.06 kg/m.  Tobacco History  Smoking Status  . Former Smoker  Smokeless Tobacco  . Never Used    Comment: quit 1980     Counseling given: Yes   Past Medical History:  Diagnosis Date  . Allergic rhinitis   . Allergy   . Anal fissure   . Anxiety   . CAD (coronary artery disease)   . Cataract    starting  . Coronary atherosclerosis of native coronary artery 2007   nonobstructive CAD by cath with 40% mid-LAD stenosis  . Depression   . Diverticulitis   . Diverticulosis 12/18/2011  . FIBROMYALGIA 06/09/2010   Qualifier: Diagnosis of  By: Carlean Purl MD, Dimas Millin Fibromyalgia   . GERD with stricture   . Helicobacter pylori (H. pylori) infection 11/25/2012   11/2012 EGD + gastric bxs  . Hiatal hernia   . HLD (hyperlipidemia)   . HTN (hypertension)   . Hyperlipidemia   . IBS (irritable bowel syndrome)   . Impotence of organic origin   . Insomnia     . Internal and external hemorrhoids without complication   . Interstitial cystitis   . INTERSTITIAL CYSTITIS 06/09/2010   Qualifier: Diagnosis of  By: Carlean Purl MD, Dimas Millin Irritable bowel syndrome 06/09/2010   Qualifier: Diagnosis of  By: Carlean Purl MD, Dimas Millin   . Reactive hypoglycemia   . Rheumatoid arthritis(714.0)   . 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 bladder distention     x 6  . CATHETER REMOVAL     super pubic area  . COLONOSCOPY  05/10/2006   diverticulosis, internal and external hemorrhoids  . COLONOSCOPY    . KNEE ARTHROSCOPY     right x 2  . NISSEN FUNDOPLICATION  123XX123  .  SHOULDER ARTHROSCOPY  2010   left  . TONSILLECTOMY AND ADENOIDECTOMY  1957  . TRANSURETHRAL RESECTION OF PROSTATE     x 2  . UPPER GASTROINTESTINAL ENDOSCOPY  05/13/2008   hiatal hernia  . VASECTOMY     Family History  Problem Relation Age of Onset  . Breast cancer Sister   . Colon polyps Father   . Diabetes Father   . Heart disease Father   . Hypertension Father   . Heart disease Mother   . Hypertension Mother   . Stroke Mother   . Diabetes Sister   . Colon cancer Neg Hx   . Esophageal cancer Neg Hx   . Rectal cancer Neg Hx   . Stomach cancer Neg Hx   . Diabetes Other     uncle   History  Sexual Activity  . Sexual activity: Not on file    Outpatient Encounter Prescriptions as of 06/18/2016  Medication Sig  . LORazepam (ATIVAN) 0.5 MG tablet TAKE 1 TABLET BY MOUTH TWICE DAILY AS NEEDED FOR ANXIETY  . traMADol (ULTRAM) 50 MG tablet Take 1 tablet (50 mg total) by mouth every 6 (six) hours as needed.  . zolpidem (AMBIEN) 5 MG tablet TAKE 1 TABLET BY MOUTH EVERY DAY AT BEDTIME AS NEEDED FOR SLEEP  . tadalafil (CIALIS) 5 MG tablet Take 1 tablet (5 mg total) by mouth daily as needed for erectile dysfunction. (Patient not taking: Reported on 06/18/2016)   No facility-administered encounter medications on file as of 06/18/2016.      Activities of Daily Living No flowsheet data found.  Patient Care Team: [name], MD as PCP - General (Internal Medicine) [name], MD as Attending Physician (Internal Medicine)   Assessment:     Given information for Hearing aid assistance if he decides to have hearing screen at some point.  Educated regarding Zostavax    Exercise Activities and Dietary recommendations  Plans on going back to water aerobic   Goals    . Exercise 150 minutes per week (moderate activity)          In general to be more active; Getting his sleep now and having [name];  Has been in aquatics in the past and may try again.  Start date Sept 1       Fall Risk Fall Risk  03/30/2016 02/02/2015 07/03/2013  Falls in the past year? No No No   Depression Screen PHQ 2/9 Scores 03/30/2016 02/02/2015 07/03/2013  PHQ - 2 Score 0 0 0    Cognitive Testing No flowsheet data found. AD8 score 0  Immunization History  Administered Date(s) Administered  . Pneumococcal Polysaccharide-23 07/03/2013   Screening Tests Health Maintenance  Topic Date Due  . ZOSTAVAX  03/13/2007  . INFLUENZA VACCINE  08/15/2016 (Originally 06/12/2016)  . PNA vac Low Risk Adult (2 of 2 - PCV13) 06/17/2017 (Originally 07/03/2014)  . TETANUS/TDAP  11/12/2020  . COLONOSCOPY  11/22/2025  . Hepatitis C Screening  Addressed      Plan:    During the course of the visit the patient was educated and counseled about the following appropriate screening and preventive services:   Vaccines to include Pneumoccal, Influenza, Hepatitis B, Td, Zostavax, HCV  zostavax and Prevnar   Electrocardiogram  121/2015  Cardiovascular Disease/ deferred to cardiology; Dr. Aundra Dubin   Colorectal cancer screening  11/2015  Diabetes screening/ A1c 5.9  Prostate Cancer Screening/ 03/2016  Glaucoma screening/ no glaucoma   Nutrition counseling / balanced diet  Smoking cessation counseling/ quit 1980   Patient Instructions (the written  plan) was given to the patient.    [id number], RN  06/18/2016  Medical screening examination/treatment/procedure(s) were performed by non-physician practitioner and as supervising provider I was immediately available for consultation/collaboration. I agree with above. Mauricio Po, FNP

## 2016-06-26 ENCOUNTER — Encounter: Payer: Self-pay | Admitting: Internal Medicine

## 2016-06-26 ENCOUNTER — Ambulatory Visit (INDEPENDENT_AMBULATORY_CARE_PROVIDER_SITE_OTHER): Payer: Medicare Other | Admitting: Internal Medicine

## 2016-06-26 VITALS — BP 118/70 | HR 76 | Ht 66.5 in | Wt 177.1 lb

## 2016-06-26 DIAGNOSIS — N3010 Interstitial cystitis (chronic) without hematuria: Secondary | ICD-10-CM | POA: Diagnosis not present

## 2016-06-26 DIAGNOSIS — K589 Irritable bowel syndrome without diarrhea: Secondary | ICD-10-CM

## 2016-06-26 DIAGNOSIS — R1013 Epigastric pain: Secondary | ICD-10-CM | POA: Diagnosis not present

## 2016-06-26 DIAGNOSIS — Z889 Allergy status to unspecified drugs, medicaments and biological substances status: Secondary | ICD-10-CM

## 2016-06-26 DIAGNOSIS — Z789 Other specified health status: Secondary | ICD-10-CM | POA: Insufficient documentation

## 2016-06-26 MED ORDER — RANITIDINE HCL 150 MG PO TABS: 150.0000 mg | ORAL_TABLET | Freq: Two times a day (BID) | ORAL | Status: DC

## 2016-06-26 MED ORDER — ONDANSETRON 4 MG PO TBDP: 4.0000 mg | ORAL_TABLET | Freq: Three times a day (TID) | ORAL | 3 refills | Status: DC | PRN

## 2016-06-26 NOTE — Patient Instructions (Addendum)
  Please purchase over the counter zantac 150mg  and take one twice a day 30 minutes  before breakfast and supper.   We have sent the following medications to your pharmacy for you to pick up at your convenience: Generic zofran    Dr Silvano Rusk will see you on 09/10/16 at 2:45pm.     I appreciate the opportunity to care for you. Silvano Rusk, MD, [facility]

## 2016-06-26 NOTE — Progress Notes (Signed)
   Subjective:    Patient ID: Erik Lolling Sr., male    DOB: 04/15/1946, 70 y.o.   MRN: [id number]  HPI Having loose stools in AM and frequent intermittent abdominal burning Feels hungry all the time ?'s malabsorption Wt Readings from Last 3 Encounters:  06/26/16 177 lb 0.8 oz (80.3 kg)  06/18/16 172 lb 12 oz (78.4 kg)  03/30/16 172 lb (78 kg)   Says cimetidine seemed to be helping 400 mg qhs but lately not being helped Has to choose between medications and will use some out of 15 total part of the time then switch Medications, allergies, past medical history, past surgical history, family history and social history are reviewed and updated in the EMR.   Review of Systems As above and some short-term meory loss - thinks he needs to get off lorazepam    Objective:   Physical Exam BP 118/70 (BP Location: Left Arm, Patient Position: Sitting, Cuff Size: Normal)   Pulse 76   Ht 5' 6.5" (1.689 m)   Wt 177 lb 0.8 oz (80.3 kg)   BMI 28.15 kg/m  NAD Abdomen soft and non-tender Appropriate mood an affect  Reviewed May 2017 urology note    Assessment & Plan:   Encounter Diagnoses  Name Primary?  . IBS (irritable bowel syndrome) Yes  . Interstitial cystitis   . Dyspepsia    Difficult problems to treat  We discussed GU-GI crosstalk issues  Cimetidine seemed to help both but ? Tachyphylaxis  Will change to ranitidine 150 mg bid which can help IC and GI issues/dyspepsia/IBS  Try ondansetron 4 mg prn - which may help IBS-diarrhea Do not know if that may help IC but hopefully will not hurt   Thought duloxetine might be a good option but he recalls trying that w/o help vs side effects - has tried numerous medications and none with long-lasting efficacy  See me late October Call back/My Chart prn sooner   15 minutes time spent with patient > half in counseling coordination of care  [id number] Jenny Reichmann, MD Alona Bene, MD

## 2016-06-27 DIAGNOSIS — R3914 Feeling of incomplete bladder emptying: Secondary | ICD-10-CM | POA: Diagnosis not present

## 2016-06-27 DIAGNOSIS — R339 Retention of urine, unspecified: Secondary | ICD-10-CM | POA: Diagnosis not present

## 2016-07-13 ENCOUNTER — Encounter: Payer: Self-pay | Admitting: Internal Medicine

## 2016-07-26 ENCOUNTER — Other Ambulatory Visit (INDEPENDENT_AMBULATORY_CARE_PROVIDER_SITE_OTHER): Payer: 59

## 2016-07-26 ENCOUNTER — Ambulatory Visit (INDEPENDENT_AMBULATORY_CARE_PROVIDER_SITE_OTHER): Payer: Medicare Other | Admitting: Internal Medicine

## 2016-07-26 ENCOUNTER — Encounter: Payer: Self-pay | Admitting: Internal Medicine

## 2016-07-26 VITALS — BP 132/78 | HR 77 | Resp 20 | Wt 174.0 lb

## 2016-07-26 DIAGNOSIS — N3010 Interstitial cystitis (chronic) without hematuria: Secondary | ICD-10-CM

## 2016-07-26 DIAGNOSIS — Z1159 Encounter for screening for other viral diseases: Secondary | ICD-10-CM

## 2016-07-26 DIAGNOSIS — F329 Major depressive disorder, single episode, unspecified: Secondary | ICD-10-CM | POA: Diagnosis not present

## 2016-07-26 DIAGNOSIS — F32A Depression, unspecified: Secondary | ICD-10-CM

## 2016-07-26 DIAGNOSIS — F450 Somatization disorder: Secondary | ICD-10-CM

## 2016-07-26 DIAGNOSIS — G4700 Insomnia, unspecified: Secondary | ICD-10-CM

## 2016-07-26 LAB — URINALYSIS, ROUTINE W REFLEX MICROSCOPIC
Bilirubin Urine: NEGATIVE
Ketones, ur: NEGATIVE
Leukocytes, UA: NEGATIVE
Nitrite: NEGATIVE
RBC / HPF: NONE SEEN (ref 0–?)
Specific Gravity, Urine: 1.01 (ref 1.000–1.030)
Total Protein, Urine: NEGATIVE
Urine Glucose: NEGATIVE
Urobilinogen, UA: 0.2 (ref 0.0–1.0)
WBC, UA: NONE SEEN (ref 0–?)
pH: 6 (ref 5.0–8.0)

## 2016-07-26 MED ORDER — QUETIAPINE FUMARATE 50 MG PO TABS: 50.0000 mg | ORAL_TABLET | Freq: Every day | ORAL | 5 refills | Status: DC

## 2016-07-26 NOTE — Progress Notes (Signed)
Pre visit review using our clinic review tool, if applicable. No additional management support is needed unless otherwise documented below in the visit note. 

## 2016-07-26 NOTE — Progress Notes (Signed)
Subjective:    Patient ID: Erik Lolling Sr., male    DOB: 04/05/1946, 70 y.o.   MRN: [id number]  HPI  Here to f/u, difficult historian with nervousness, vague complaints. "Im sensitive to medications". Current meds not working well, 1 mo ago started tagamet, had some urinary retention and stopped.  Stopped that and started lorazepam.  Has taken cystiQ med for IC.  Seems to have "instability" wondering if he has "autoimmune" problem or immune deficiency Was started on cipro per urology 1 wk ago but stopped after had some throat tightness. Has tried mult meds for depression/anxiety Past Medical History:  Diagnosis Date  . Allergic rhinitis   . Allergy   . Anal fissure   . Anxiety   . CAD (coronary artery disease)   . Cataract    starting  . Coronary atherosclerosis of native coronary artery 2007   nonobstructive CAD by cath with 40% mid-LAD stenosis  . Depression   . Diverticulitis   . Diverticulosis 12/18/2011  . FIBROMYALGIA 06/09/2010   Qualifier: Diagnosis of  By: Carlean Purl MD, Dimas Millin Fibromyalgia   . GERD with stricture   . Helicobacter pylori (H. pylori) infection 11/25/2012   11/2012 EGD + gastric bxs  . Hiatal hernia   . HLD (hyperlipidemia)   . HTN (hypertension)   . Hyperlipidemia   . IBS (irritable bowel syndrome)   . Impotence of organic origin   . Insomnia   . Internal and external hemorrhoids without complication   . Interstitial cystitis   . INTERSTITIAL CYSTITIS 06/09/2010   Qualifier: Diagnosis of  By: Carlean Purl MD, Dimas Millin Irritable bowel syndrome 06/09/2010   Qualifier: Diagnosis of  By: Carlean Purl MD, Dimas Millin   . Reactive hypoglycemia   . Rheumatoid arthritis(714.0)   . 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 bladder distention     x 6  . CATHETER REMOVAL     super pubic area  . COLONOSCOPY  05/10/2006   diverticulosis, internal and external hemorrhoids  . COLONOSCOPY    . KNEE ARTHROSCOPY      right x 2  . NISSEN FUNDOPLICATION  123XX123  . SHOULDER ARTHROSCOPY  2010   left  . TONSILLECTOMY AND ADENOIDECTOMY  1957  . TRANSURETHRAL RESECTION OF PROSTATE     x 2  . UPPER GASTROINTESTINAL ENDOSCOPY  05/13/2008   hiatal hernia  . VASECTOMY      reports that he has quit smoking. He has never used smokeless tobacco. He reports that he does not drink alcohol or use drugs. family history includes Breast cancer in his sister; Colon polyps in his father; Diabetes in his father, other, and sister; Heart disease in his father and mother; Hypertension in his father and mother; Stroke in his mother. Allergies  Allergen Reactions  . Ambien [Zolpidem]   . Lexapro [Escitalopram Oxalate]   . Pneumovax [Pneumococcal Polysaccharide Vaccine] Other (See Comments)    pain  . Prilosec [Omeprazole] Other (See Comments)    Per patient joint pain with PPI's  . Remeron [Mirtazapine]   . Statins Other (See Comments)    Joint pain  . Tramadol Other (See Comments)   Current Outpatient Prescriptions on File Prior to Visit  Medication Sig Dispense Refill  . LORazepam (ATIVAN) 0.5 MG tablet TAKE 1 TABLET BY MOUTH TWICE DAILY AS NEEDED FOR ANXIETY 60 tablet 2  . ranitidine (ZANTAC) 150 MG tablet Take  1 tablet (150 mg total) by mouth 2 (two) times daily.    . traMADol (ULTRAM) 50 MG tablet Take 1 tablet (50 mg total) by mouth every 6 (six) hours as needed. 120 tablet 2   No current facility-administered medications on file prior to visit.      Review of Systems  Constitutional: Negative for unusual diaphoresis or night sweats HENT: Negative for ear swelling or discharge Eyes: Negative for worsening visual haziness  Respiratory: Negative for choking and stridor.   Gastrointestinal: Negative for distension or worsening eructation Genitourinary: Negative for retention or change in urine volume.  Musculoskeletal: Negative for other MSK pain or swelling Skin: Negative for color change and worsening  wound Neurological: Negative for tremors and numbness other than noted  Psychiatric/Behavioral: Negative for decreased concentration or agitation other than above       Objective:   Physical Exam BP 132/78   Pulse 77   Resp 20   Wt 174 lb (78.9 kg)   SpO2 94%   BMI 27.66 kg/m  VS noted,  Constitutional: Pt appears in no apparent distress HENT: Head: NCAT.  Right Ear: External ear normal.  Left Ear: External ear normal.  Eyes: . Pupils are equal, round, and reactive to light. Conjunctivae and EOM are normal Neck: Normal range of motion. Neck supple.  Cardiovascular: Normal rate and regular rhythm.   Pulmonary/Chest: Effort normal and breath sounds without rales or wheezing.  Abd:  Soft, NT, ND, + BS Neurological: Pt is alert. Not confused , motor grossly intact Skin: Skin is warm. No rash, no LE edema Psychiatric: Pt behavior is normal. No agitation. 2+ nervous    Assessment & Plan:

## 2016-07-26 NOTE — Patient Instructions (Signed)
Please take all new medication as prescribed - the seroquel  Please continue all other medications as before, and refills have been done if requested.  Please have the pharmacy call with any other refills you may need.  Please keep your appointments with your specialists as you may have planned  Please go to the LAB in the Basement (turn left off the elevator) for the tests to be done today  You will be contacted by phone if any changes need to be made immediately.  Otherwise, you will receive a letter about your results with an explanation, but please check with MyChart first.  Please remember to sign up for MyChart if you have not done so, as this will be important to you in the future with finding out test results, communicating by private email, and scheduling acute appointments online when needed.

## 2016-07-27 LAB — HEPATITIS C ANTIBODY: HCV Ab: NEGATIVE

## 2016-07-27 LAB — URINE CULTURE: Organism ID, Bacteria: NO GROWTH

## 2016-07-28 NOTE — Assessment & Plan Note (Signed)
In light of anx/depression will try qhs seroquel,  to f/u any worsening symptoms or concerns

## 2016-07-28 NOTE — Assessment & Plan Note (Signed)
stable overall by history and exam, recent data reviewed with pt, and pt to continue medical treatment as before,  to f/u any worsening symptoms or concerns Lab Results  Component Value Date   WBC 8.5 03/30/2016   HGB 16.0 03/30/2016   HCT 46.9 03/30/2016   PLT 298.0 03/30/2016   GLUCOSE 98 03/30/2016   CHOL 240 (H) 03/30/2016   TRIG 178.0 (H) 03/30/2016   HDL 38.80 (L) 03/30/2016   LDLDIRECT 159.0 06/30/2015   LDLCALC 166 (H) 03/30/2016   ALT 19 03/30/2016   AST 19 03/30/2016   NA 137 03/30/2016   K 4.1 03/30/2016   CL 102 03/30/2016   CREATININE 1.14 03/30/2016   BUN 18 03/30/2016   CO2 27 03/30/2016   TSH 2.74 03/30/2016   PSA 0.71 03/30/2016   HGBA1C 5.9 03/30/2016

## 2016-07-28 NOTE — Assessment & Plan Note (Signed)
With mild urinary retention, for repeat urine studies

## 2016-07-28 NOTE — Assessment & Plan Note (Signed)
D/w pt , also for SPEP today,  to f/u any worsening symptoms or concerns

## 2016-07-30 LAB — PROTEIN ELECTROPHORESIS, SERUM, WITH REFLEX
Albumin ELP: 4.5 g/dL (ref 3.8–4.8)
Alpha-1-Globulin: 0.3 g/dL (ref 0.2–0.3)
Alpha-2-Globulin: 0.7 g/dL (ref 0.5–0.9)
Beta 2: 0.4 g/dL (ref 0.2–0.5)
Beta Globulin: 0.4 g/dL (ref 0.4–0.6)
Gamma Globulin: 1.2 g/dL (ref 0.8–1.7)
Total Protein, Serum Electrophoresis: 7.4 g/dL (ref 6.1–8.1)

## 2016-08-16 DIAGNOSIS — N390 Urinary tract infection, site not specified: Secondary | ICD-10-CM | POA: Diagnosis not present

## 2016-08-22 ENCOUNTER — Telehealth: Payer: Self-pay

## 2016-08-22 ENCOUNTER — Encounter: Payer: Self-pay | Admitting: Internal Medicine

## 2016-08-22 MED ORDER — QUETIAPINE FUMARATE 50 MG PO TABS: 50.0000 mg | ORAL_TABLET | Freq: Every day | ORAL | 5 refills | Status: DC

## 2016-08-22 MED ORDER — RANITIDINE HCL 150 MG PO TABS: 150.0000 mg | ORAL_TABLET | Freq: Two times a day (BID) | ORAL | Status: DC

## 2016-08-22 MED ORDER — LORAZEPAM 0.5 MG PO TABS: 0.5000 mg | ORAL_TABLET | Freq: Two times a day (BID) | ORAL | 2 refills | Status: DC | PRN

## 2016-08-22 NOTE — Telephone Encounter (Signed)
Refills sent to pharmacy. 

## 2016-08-30 DIAGNOSIS — R7303 Prediabetes: Secondary | ICD-10-CM | POA: Diagnosis not present

## 2016-08-30 DIAGNOSIS — E785 Hyperlipidemia, unspecified: Secondary | ICD-10-CM | POA: Diagnosis not present

## 2016-08-30 DIAGNOSIS — I10 Essential (primary) hypertension: Secondary | ICD-10-CM | POA: Diagnosis not present

## 2016-09-06 ENCOUNTER — Encounter: Payer: Self-pay | Admitting: Internal Medicine

## 2016-09-06 DIAGNOSIS — E875 Hyperkalemia: Secondary | ICD-10-CM

## 2016-09-10 ENCOUNTER — Other Ambulatory Visit (INDEPENDENT_AMBULATORY_CARE_PROVIDER_SITE_OTHER): Payer: Medicare Other

## 2016-09-10 ENCOUNTER — Ambulatory Visit (INDEPENDENT_AMBULATORY_CARE_PROVIDER_SITE_OTHER): Payer: Medicare Other | Admitting: Internal Medicine

## 2016-09-10 ENCOUNTER — Encounter: Payer: Self-pay | Admitting: Internal Medicine

## 2016-09-10 VITALS — BP 130/70 | HR 80 | Ht 67.0 in | Wt 175.0 lb

## 2016-09-10 DIAGNOSIS — J029 Acute pharyngitis, unspecified: Secondary | ICD-10-CM

## 2016-09-10 DIAGNOSIS — K580 Irritable bowel syndrome with diarrhea: Secondary | ICD-10-CM

## 2016-09-10 DIAGNOSIS — E875 Hyperkalemia: Secondary | ICD-10-CM | POA: Diagnosis not present

## 2016-09-10 DIAGNOSIS — R6889 Other general symptoms and signs: Secondary | ICD-10-CM | POA: Diagnosis not present

## 2016-09-10 DIAGNOSIS — R0989 Other specified symptoms and signs involving the circulatory and respiratory systems: Secondary | ICD-10-CM

## 2016-09-10 LAB — BASIC METABOLIC PANEL
BUN: 20 mg/dL (ref 6–23)
CO2: 27 mEq/L (ref 19–32)
Calcium: 9.6 mg/dL (ref 8.4–10.5)
Chloride: 104 mEq/L (ref 96–112)
Creatinine, Ser: 1.36 mg/dL (ref 0.40–1.50)
GFR: 55.14 mL/min — ABNORMAL LOW (ref >60.00)
Glucose, Bld: 85 mg/dL (ref 70–99)
Potassium: 4.7 mEq/L (ref 3.5–5.1)
Sodium: 138 mEq/L (ref 135–145)

## 2016-09-10 NOTE — Patient Instructions (Signed)
Dr. Carlean Purl recommended a anorectal manometry but if [name] recommends pelvic floor physical therapy then continue with his recommendations.   Follow up with Dr. Carlean Purl as needed.

## 2016-09-10 NOTE — Progress Notes (Signed)
   DAMEN GILE Sr. 70 y.o. May 27, 1946 [id number]  Assessment & Plan:   Encounter Diagnoses  Name Primary?  . Irritable bowel syndrome with diarrhea Yes  . Sore throat   . Throat tightness      Consider anorectal manometry  ? Repeat pelvic floor PT - he will discuss w/ [name] I explained there are limits to what we can control or "fix" with medications and other treatment Suspect some overlap of IBS and IC and that triggering of nerves is underlying cause in much of sxs  Throat sxs doubtful to be reflux s/p fundoplication - he is intolerant of H2B and PPI's anyway  See me prn  [id number] Jenny Reichmann, MD Alona Bene, MD   Subjective:   Chief Complaint: IBS, swallowing problems  HPI  Lower abdominal burning still but better - he thinks from Seroquel (help) Ondansetron caused diarrhea - made him worse   Still has 4- 6 stools many days with formed and then progressively looser stools with some urgent defecation - has not had an anorectal man but has had pelvic floor PT in past  2 mos post-nasal drip, tightness in throat mild cough/hoarseness, no heartburn  Medications, allergies, past medical history, past surgical history, family history and social history are reviewed and updated in the EMR.   Review of Systems As above - seeing Dr,. Evans tomorrow  Objective:   Physical Exam BP 130/70   Pulse 80 Comment: slighty irreguar  Ht 5\' 7"  (1.702 m)   Wt 175 lb (79.4 kg)   BMI 27.41 kg/m  NAD  15 minutes time spent with patient > half in counseling coordination of care

## 2016-09-11 DIAGNOSIS — R300 Dysuria: Secondary | ICD-10-CM | POA: Diagnosis not present

## 2016-09-11 DIAGNOSIS — N3010 Interstitial cystitis (chronic) without hematuria: Secondary | ICD-10-CM | POA: Diagnosis not present

## 2016-09-11 DIAGNOSIS — M6289 Other specified disorders of muscle: Secondary | ICD-10-CM | POA: Diagnosis not present

## 2016-09-11 DIAGNOSIS — K580 Irritable bowel syndrome with diarrhea: Secondary | ICD-10-CM | POA: Diagnosis not present

## 2016-09-20 ENCOUNTER — Telehealth: Payer: Self-pay | Admitting: Internal Medicine

## 2016-09-20 MED ORDER — QUETIAPINE FUMARATE 50 MG PO TABS: 50.0000 mg | ORAL_TABLET | Freq: Every day | ORAL | 3 refills | Status: DC

## 2016-09-20 NOTE — Telephone Encounter (Signed)
Pt called request all of his rx [facility]: tramadol, LORazepam (ATIVAN) 0.5 MG tablet, QUEtiapine (SEROQUEL) 50 MG tablet and cyclobenzapine. Please call him back once this is done, she has been trying to get this done since last week.

## 2016-09-20 NOTE — Telephone Encounter (Signed)
[place] for seroquel [facility], but I would not be able to send the tramadol or ativan as these are controlled substances, the prescriptions would be a large number of pills, and there is increasing scrutiny by the medical board regarding appropriateness of controlled substances for all patients

## 2016-09-24 DIAGNOSIS — R339 Retention of urine, unspecified: Secondary | ICD-10-CM | POA: Diagnosis not present

## 2016-09-24 DIAGNOSIS — R3914 Feeling of incomplete bladder emptying: Secondary | ICD-10-CM | POA: Diagnosis not present

## 2016-10-01 DIAGNOSIS — M62838 Other muscle spasm: Secondary | ICD-10-CM | POA: Diagnosis not present

## 2016-10-01 DIAGNOSIS — N3010 Interstitial cystitis (chronic) without hematuria: Secondary | ICD-10-CM | POA: Diagnosis not present

## 2016-10-01 DIAGNOSIS — M6281 Muscle weakness (generalized): Secondary | ICD-10-CM | POA: Diagnosis not present

## 2016-10-01 DIAGNOSIS — R278 Other lack of coordination: Secondary | ICD-10-CM | POA: Diagnosis not present

## 2016-10-09 DIAGNOSIS — M62838 Other muscle spasm: Secondary | ICD-10-CM | POA: Diagnosis not present

## 2016-10-09 DIAGNOSIS — R278 Other lack of coordination: Secondary | ICD-10-CM | POA: Diagnosis not present

## 2016-10-09 DIAGNOSIS — R102 Pelvic and perineal pain: Secondary | ICD-10-CM | POA: Diagnosis not present

## 2016-10-10 ENCOUNTER — Ambulatory Visit (INDEPENDENT_AMBULATORY_CARE_PROVIDER_SITE_OTHER): Payer: Medicare Other | Admitting: Internal Medicine

## 2016-10-10 ENCOUNTER — Encounter: Payer: Self-pay | Admitting: Internal Medicine

## 2016-10-10 VITALS — BP 130/70 | HR 86 | Resp 20 | Wt 174.0 lb

## 2016-10-10 DIAGNOSIS — F450 Somatization disorder: Secondary | ICD-10-CM

## 2016-10-10 DIAGNOSIS — D692 Other nonthrombocytopenic purpura: Secondary | ICD-10-CM

## 2016-10-10 DIAGNOSIS — Z0001 Encounter for general adult medical examination with abnormal findings: Secondary | ICD-10-CM

## 2016-10-10 MED ORDER — LORAZEPAM 0.5 MG PO TABS: 0.5000 mg | ORAL_TABLET | Freq: Two times a day (BID) | ORAL | 2 refills | Status: DC | PRN

## 2016-10-10 MED ORDER — HYDRALAZINE HCL 25 MG PO TABS: 25.0000 mg | ORAL_TABLET | Freq: Three times a day (TID) | ORAL | 3 refills | Status: DC | PRN

## 2016-10-10 MED ORDER — TAMSULOSIN HCL 0.4 MG PO CAPS
0.4000 mg | ORAL_CAPSULE | Freq: Every day | ORAL | 3 refills | Status: DC
Start: 2016-10-10 — End: 2017-03-26

## 2016-10-10 MED ORDER — CYCLOBENZAPRINE HCL 5 MG PO TABS: 5.0000 mg | ORAL_TABLET | Freq: Three times a day (TID) | ORAL | 3 refills | Status: DC | PRN

## 2016-10-10 MED ORDER — TRAMADOL HCL 50 MG PO TABS: 50.0000 mg | ORAL_TABLET | Freq: Four times a day (QID) | ORAL | 2 refills | Status: DC | PRN

## 2016-10-10 MED ORDER — HYDROXYZINE HCL 25 MG PO TABS: 25.0000 mg | ORAL_TABLET | Freq: Three times a day (TID) | ORAL | 3 refills | Status: DC | PRN

## 2016-10-10 NOTE — Progress Notes (Signed)
Subjective:    Patient ID: Erik Lolling Sr., male    DOB: 06-13-1946, 70 y.o.   MRN: [id number]  HPI  Here to f/u, c/o simply bruises x 3 to arms of which he is unaware how this happened. No pain, just noticed small bruising. Has not taken asa and does not want to.  Pt denies chest pain, increased sob or doe, wheezing, orthopnea, PND, increased LE swelling, palpitations, dizziness or syncope.  Pt denies new neurological symptoms such as new headache, or facial or extremity weakness or numbness   Pt denies polydipsia, polyuria Past Medical History:  Diagnosis Date  . Allergic rhinitis   . Allergy   . Anal fissure   . Anxiety   . CAD (coronary artery disease)   . Cataract    starting  . Coronary atherosclerosis of native coronary artery 2007   nonobstructive CAD by cath with 40% mid-LAD stenosis  . Depression   . Diverticulitis   . Diverticulosis 12/18/2011  . FIBROMYALGIA 06/09/2010   Qualifier: Diagnosis of  By: Carlean Purl MD, Dimas Millin Fibromyalgia   . GERD with stricture   . Helicobacter pylori (H. pylori) infection 11/25/2012   11/2012 EGD + gastric bxs  . Hiatal hernia   . HLD (hyperlipidemia)   . HTN (hypertension)   . Hyperlipidemia   . IBS (irritable bowel syndrome)   . Impotence of organic origin   . Insomnia   . Internal and external hemorrhoids without complication   . Interstitial cystitis   . INTERSTITIAL CYSTITIS 06/09/2010   Qualifier: Diagnosis of  By: Carlean Purl MD, Dimas Millin Irritable bowel syndrome 06/09/2010   Qualifier: Diagnosis of  By: Carlean Purl MD, Dimas Millin   . Reactive hypoglycemia   . Rheumatoid arthritis(714.0)   . 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 bladder distention     x 6  . CATHETER REMOVAL     super pubic area  . COLONOSCOPY  05/10/2006   diverticulosis, internal and external hemorrhoids  . COLONOSCOPY    . INTERSTIM IMPLANT PLACEMENT    . INTERSTIM IMPLANT REMOVAL    . KNEE  ARTHROSCOPY     right x 2  . NISSEN FUNDOPLICATION  123XX123  . SHOULDER ARTHROSCOPY  2010   left  . TONSILLECTOMY AND ADENOIDECTOMY  1957  . TRANSURETHRAL RESECTION OF PROSTATE     x 2  . UPPER GASTROINTESTINAL ENDOSCOPY  05/13/2008   hiatal hernia  . VASECTOMY      reports that he has quit smoking. He has never used smokeless tobacco. He reports that he does not drink alcohol or use drugs. family history includes Breast cancer in his sister; Colon polyps in his father; Diabetes in his father, paternal uncle, and sister; Heart disease in his father and mother; Hypertension in his father and mother; Stroke in his mother. Allergies  Allergen Reactions  . Ambien [Zolpidem]   . Lexapro [Escitalopram Oxalate]   . Pneumovax [Pneumococcal Polysaccharide Vaccine] Other (See Comments)    pain  . Prilosec [Omeprazole] Other (See Comments)    Per patient joint pain with PPI's  . Remeron [Mirtazapine]   . Statins Other (See Comments)    Joint pain  . Tramadol Other (See Comments)   Current Outpatient Prescriptions on File Prior to Visit  Medication Sig Dispense Refill  . AMBULATORY NON FORMULARY MEDICATION Medication Name: *[facility] 12 capsules by mouth daiy    .  AMBULATORY NON FORMULARY MEDICATION Medication Name: Ssuper 8 Probiotic Take 1 capsule by mouth once daily    . QUEtiapine (SEROQUEL) 50 MG tablet Take 1 tablet (50 mg total) by mouth at bedtime. 90 tablet 3   No current facility-administered medications on file prior to visit.    Review of Systems All otherwise neg per pt     Objective:   Physical Exam BP 130/70   Pulse 86   Resp 20   Wt 174 lb (78.9 kg)   SpO2 97%   BMI 27.25 kg/m  VS noted,  Constitutional: Pt appears in no apparent distress HENT: Head: NCAT.  Right Ear: External ear normal.  Left Ear: External ear normal.  Eyes: . Pupils are equal, round, and reactive to light. Conjunctivae and EOM are normal Neck: Normal range of motion. Neck supple.    Cardiovascular: Normal rate and regular rhythm.   Pulmonary/Chest: Effort normal and breath sounds without rales or wheezing.  3 small 1 cm bruising to UE's only, NT, no skin tears or swelling Neurological: Pt is alert. Not confused , motor grossly intact Skin: Skin is warm. No rash, no LE edema Psychiatric: Pt behavior is normal. No agitation.     Assessment & Plan:

## 2016-10-10 NOTE — Progress Notes (Signed)
Pre visit review using our clinic review tool, if applicable. No additional management support is needed unless otherwise documented below in the visit note. 

## 2016-10-10 NOTE — Patient Instructions (Addendum)
Please continue all other medications as before, and refills have been done if requested.  Please have the pharmacy call with any other refills you may need.  Please continue your efforts at being more active, low cholesterol diet, and weight control.  You are otherwise up to date with prevention measures today.  Please keep your appointments with your specialists as you may have planned  Please return in 6 months, or sooner if needed, with Lab testing done 3-5 days before  

## 2016-10-14 DIAGNOSIS — D692 Other nonthrombocytopenic purpura: Secondary | ICD-10-CM | POA: Insufficient documentation

## 2016-10-14 NOTE — Assessment & Plan Note (Signed)
O/w stable, to cont seroquel asd,  to f/u any worsening symptoms or concerns

## 2016-10-14 NOTE — Assessment & Plan Note (Signed)
D/w pt benign nature of disorder, no specific eval or tx needed Lab Results  Component Value Date   WBC 8.5 03/30/2016   HGB 16.0 03/30/2016   HCT 46.9 03/30/2016   MCV 89.2 03/30/2016   PLT 298.0 03/30/2016

## 2016-10-16 DIAGNOSIS — R102 Pelvic and perineal pain: Secondary | ICD-10-CM | POA: Diagnosis not present

## 2016-10-16 DIAGNOSIS — M6281 Muscle weakness (generalized): Secondary | ICD-10-CM | POA: Diagnosis not present

## 2016-10-16 DIAGNOSIS — K5900 Constipation, unspecified: Secondary | ICD-10-CM | POA: Diagnosis not present

## 2016-10-16 DIAGNOSIS — R278 Other lack of coordination: Secondary | ICD-10-CM | POA: Diagnosis not present

## 2016-10-16 DIAGNOSIS — M62838 Other muscle spasm: Secondary | ICD-10-CM | POA: Diagnosis not present

## 2016-10-23 DIAGNOSIS — K5900 Constipation, unspecified: Secondary | ICD-10-CM | POA: Diagnosis not present

## 2016-10-23 DIAGNOSIS — M62838 Other muscle spasm: Secondary | ICD-10-CM | POA: Diagnosis not present

## 2016-10-23 DIAGNOSIS — R102 Pelvic and perineal pain: Secondary | ICD-10-CM | POA: Diagnosis not present

## 2016-10-23 DIAGNOSIS — M6281 Muscle weakness (generalized): Secondary | ICD-10-CM | POA: Diagnosis not present

## 2016-10-25 ENCOUNTER — Encounter: Payer: Self-pay | Admitting: Internal Medicine

## 2016-10-30 DIAGNOSIS — R102 Pelvic and perineal pain: Secondary | ICD-10-CM | POA: Diagnosis not present

## 2016-10-30 DIAGNOSIS — M6281 Muscle weakness (generalized): Secondary | ICD-10-CM | POA: Diagnosis not present

## 2016-10-30 DIAGNOSIS — K5900 Constipation, unspecified: Secondary | ICD-10-CM | POA: Diagnosis not present

## 2016-10-30 DIAGNOSIS — M62838 Other muscle spasm: Secondary | ICD-10-CM | POA: Diagnosis not present

## 2016-10-31 DIAGNOSIS — R339 Retention of urine, unspecified: Secondary | ICD-10-CM | POA: Diagnosis not present

## 2016-10-31 DIAGNOSIS — R3914 Feeling of incomplete bladder emptying: Secondary | ICD-10-CM | POA: Diagnosis not present

## 2016-11-01 ENCOUNTER — Encounter: Payer: Self-pay | Admitting: Internal Medicine

## 2016-11-07 ENCOUNTER — Encounter: Payer: Self-pay | Admitting: Internal Medicine

## 2016-11-14 ENCOUNTER — Encounter: Payer: Self-pay | Admitting: Internal Medicine

## 2016-11-14 ENCOUNTER — Ambulatory Visit (INDEPENDENT_AMBULATORY_CARE_PROVIDER_SITE_OTHER): Payer: Medicare Other | Admitting: Internal Medicine

## 2016-11-14 VITALS — BP 130/70 | HR 75 | Temp 98.3°F | Resp 20 | Wt 173.0 lb

## 2016-11-14 DIAGNOSIS — F329 Major depressive disorder, single episode, unspecified: Secondary | ICD-10-CM | POA: Diagnosis not present

## 2016-11-14 DIAGNOSIS — G4700 Insomnia, unspecified: Secondary | ICD-10-CM | POA: Diagnosis not present

## 2016-11-14 DIAGNOSIS — J019 Acute sinusitis, unspecified: Secondary | ICD-10-CM

## 2016-11-14 DIAGNOSIS — F32A Depression, unspecified: Secondary | ICD-10-CM

## 2016-11-14 MED ORDER — CEPHALEXIN 500 MG PO CAPS: 500.0000 mg | ORAL_CAPSULE | Freq: Four times a day (QID) | ORAL | 0 refills | Status: AC

## 2016-11-14 NOTE — Progress Notes (Signed)
Subjective:    Patient ID: Erik Lolling Sr., male    DOB: 1946/06/17, 71 y.o.   MRN: [id number]  HPI   Here with 2-3 days acute onset fever, facial pain, pressure, headache, general weakness and malaise, and greenish d/c, with mild ST and cough, but pt denies chest pain, wheezing, increased sob or doe, orthopnea, PND, increased LE swelling, palpitations, dizziness or syncope. Pt denies new neurological symptoms such as new headache, or facial or extremity weakness or numbness   Pt denies polydipsia, polyuria. Is only taking 1/2 qhs seroquel as sleep has improved, and considering stopping altogether.   Denies worsening depressive symptoms, suicidal ideation, or panic Past Medical History:  Diagnosis Date  . Allergic rhinitis   . Allergy   . Anal fissure   . Anxiety   . CAD (coronary artery disease)   . Cataract    starting  . Coronary atherosclerosis of native coronary artery 2007   nonobstructive CAD by cath with 40% mid-LAD stenosis  . Depression   . Diverticulitis   . Diverticulosis 12/18/2011  . FIBROMYALGIA 06/09/2010   Qualifier: Diagnosis of  By: Carlean Purl MD, Dimas Millin Fibromyalgia   . GERD with stricture   . Helicobacter pylori (H. pylori) infection 11/25/2012   11/2012 EGD + gastric bxs  . Hiatal hernia   . HLD (hyperlipidemia)   . HTN (hypertension)   . Hyperlipidemia   . IBS (irritable bowel syndrome)   . Impotence of organic origin   . Insomnia   . Internal and external hemorrhoids without complication   . Interstitial cystitis   . INTERSTITIAL CYSTITIS 06/09/2010   Qualifier: Diagnosis of  By: Carlean Purl MD, Dimas Millin Irritable bowel syndrome 06/09/2010   Qualifier: Diagnosis of  By: Carlean Purl MD, Dimas Millin   . Reactive hypoglycemia   . Rheumatoid arthritis(714.0)   . 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 bladder distention     x 6  . CATHETER REMOVAL     super pubic area  . COLONOSCOPY  05/10/2006   diverticulosis, internal and external hemorrhoids  . COLONOSCOPY    . INTERSTIM IMPLANT PLACEMENT    . INTERSTIM IMPLANT REMOVAL    . KNEE ARTHROSCOPY     right x 2  . NISSEN FUNDOPLICATION  123XX123  . SHOULDER ARTHROSCOPY  2010   left  . TONSILLECTOMY AND ADENOIDECTOMY  1957  . TRANSURETHRAL RESECTION OF PROSTATE     x 2  . UPPER GASTROINTESTINAL ENDOSCOPY  05/13/2008   hiatal hernia  . VASECTOMY      reports that he has quit smoking. He has never used smokeless tobacco. He reports that he does not drink alcohol or use drugs. family history includes Breast cancer in his sister; Colon polyps in his father; Diabetes in his father, paternal uncle, and sister; Heart disease in his father and mother; Hypertension in his father and mother; Stroke in his mother. Allergies  Allergen Reactions  . Ambien [Zolpidem]   . Lexapro [Escitalopram Oxalate]   . Pneumovax [Pneumococcal Polysaccharide Vaccine] Other (See Comments)    pain  . Prilosec [Omeprazole] Other (See Comments)    Per patient joint pain with PPI's  . Remeron [Mirtazapine]   . Statins Other (See Comments)    Joint pain  . Tramadol Other (See Comments)   Current Outpatient Prescriptions on File Prior to Visit  Medication Sig Dispense Refill  . AMBULATORY NON  FORMULARY MEDICATION Medication Name: *[facility] 12 capsules by mouth daiy    . AMBULATORY NON FORMULARY MEDICATION Medication Name: Ssuper 8 Probiotic Take 1 capsule by mouth once daily    . cyclobenzaprine (FLEXERIL) 5 MG tablet Take 1 tablet (5 mg total) by mouth 3 (three) times daily as needed for muscle spasms. 90 tablet 3  . hydrOXYzine (ATARAX/VISTARIL) 25 MG tablet Take 1 tablet (25 mg total) by mouth 3 (three) times daily as needed. To please disregard rx for hydralazine today due to error 270 tablet 3  . LORazepam (ATIVAN) 0.5 MG tablet Take 1 tablet (0.5 mg total) by mouth 2 (two) times daily as needed. for anxiety 60 tablet 2  . QUEtiapine (SEROQUEL) 50 MG  tablet Take 1 tablet (50 mg total) by mouth at bedtime. 90 tablet 3  . tamsulosin (FLOMAX) 0.4 MG CAPS capsule Take 1 capsule (0.4 mg total) by mouth daily. 90 capsule 3  . traMADol (ULTRAM) 50 MG tablet Take 1 tablet (50 mg total) by mouth every 6 (six) hours as needed. 120 tablet 2   No current facility-administered medications on file prior to visit.    Review of Systems  Constitutional: Negative for unusual diaphoresis or night sweats HENT: Negative for ear swelling or discharge Eyes: Negative for worsening visual haziness  Respiratory: Negative for choking and stridor.   Gastrointestinal: Negative for distension or worsening eructation Genitourinary: Negative for retention or change in urine volume.  Musculoskeletal: Negative for other MSK pain or swelling Skin: Negative for color change and worsening wound Neurological: Negative for tremors and numbness other than noted  Psychiatric/Behavioral: Negative for decreased concentration or agitation other than above   All other system neg per pt    Objective:   Physical Exam BP 130/70   Pulse 75   Temp 98.3 F (36.8 C) (Oral)   Resp 20   Wt 173 lb (78.5 kg)   SpO2 96%   BMI 27.10 kg/m  VS noted, mild ill Constitutional: Pt appears in no apparent distress HENT: Head: NCAT.  Right Ear: External ear normal.  Left Ear: External ear normal.  Eyes: . Pupils are equal, round, and reactive to light. Conjunctivae and EOM are normal Bilat tm's with mild erythema.  Max sinus areas mild tender.  Pharynx with mild erythema, no exudate Neck: Normal range of motion. Neck supple.  Cardiovascular: Normal rate and regular rhythm.   Pulmonary/Chest: Effort normal and breath sounds decreased without rales or wheezing.  Neurological: Pt is alert. Not confused , motor grossly intact Skin: Skin is warm. No rash, no LE edema Psychiatric: Pt behavior is normal. No agitation. mild nervous    Assessment & Plan:

## 2016-11-14 NOTE — Patient Instructions (Signed)
Please take all new medication as prescribed - the antibiotic  Please continue all other medications as before, and refills have been done if requested.  Please have the pharmacy call with any other refills you may need.  Please continue your efforts at being more active, low cholesterol diet, and weight control.  Please keep your appointments with your specialists as you may have planned    

## 2016-11-14 NOTE — Progress Notes (Signed)
Pre visit review using our clinic review tool, if applicable. No additional management support is needed unless otherwise documented below in the visit note. 

## 2016-11-16 DIAGNOSIS — H40003 Preglaucoma, unspecified, bilateral: Secondary | ICD-10-CM | POA: Diagnosis not present

## 2016-11-16 DIAGNOSIS — H2513 Age-related nuclear cataract, bilateral: Secondary | ICD-10-CM | POA: Diagnosis not present

## 2016-11-18 DIAGNOSIS — J019 Acute sinusitis, unspecified: Secondary | ICD-10-CM | POA: Insufficient documentation

## 2016-11-18 NOTE — Assessment & Plan Note (Signed)
stable overall by history and exam, denies Si or HI, and pt to continue medical treatment as before,  to f/u any worsening symptoms or concerns

## 2016-11-18 NOTE — Assessment & Plan Note (Signed)
With some improvement recently, to wean off seroquel if able,  to f/u any worsening symptoms or concerns

## 2016-11-18 NOTE — Assessment & Plan Note (Signed)
Mild to mod, for antibx course,  to f/u any worsening symptoms or concerns 

## 2016-12-06 ENCOUNTER — Encounter: Payer: Self-pay | Admitting: Internal Medicine

## 2016-12-13 DIAGNOSIS — M25552 Pain in left hip: Secondary | ICD-10-CM | POA: Diagnosis not present

## 2016-12-13 DIAGNOSIS — G8929 Other chronic pain: Secondary | ICD-10-CM | POA: Diagnosis not present

## 2016-12-13 DIAGNOSIS — M25512 Pain in left shoulder: Secondary | ICD-10-CM | POA: Diagnosis not present

## 2016-12-17 ENCOUNTER — Encounter: Payer: Self-pay | Admitting: Internal Medicine

## 2016-12-17 NOTE — Telephone Encounter (Signed)
Yes, seroquel could be causing the blurry vision.  Have him stop it - if there is no improvement he will need to see his eye doctor.    Ok to restart ativan - ok to give one month prescription - no refills.  He will need to discuss further with Dr Jenny Reichmann when he returns. Please phone in or print lorazepam.

## 2016-12-30 ENCOUNTER — Encounter: Payer: Self-pay | Admitting: Internal Medicine

## 2016-12-31 DIAGNOSIS — R3914 Feeling of incomplete bladder emptying: Secondary | ICD-10-CM | POA: Diagnosis not present

## 2016-12-31 DIAGNOSIS — R339 Retention of urine, unspecified: Secondary | ICD-10-CM | POA: Diagnosis not present

## 2017-01-01 MED ORDER — ESZOPICLONE 2 MG PO TABS: 2.0000 mg | ORAL_TABLET | Freq: Every evening | ORAL | 5 refills | Status: DC | PRN

## 2017-01-01 NOTE — Telephone Encounter (Signed)
lunesta Done hardcopy to [employer]

## 2017-01-02 ENCOUNTER — Telehealth: Payer: Self-pay | Admitting: Internal Medicine

## 2017-01-02 NOTE — Telephone Encounter (Signed)
Pt called req to speak to [place]. Please give him a call back.

## 2017-01-02 NOTE — Telephone Encounter (Signed)
Pt has received MyChart message from Dr Jenny Reichmann. RX for Johnnye Sima has been went to local [facility]

## 2017-01-16 ENCOUNTER — Other Ambulatory Visit (INDEPENDENT_AMBULATORY_CARE_PROVIDER_SITE_OTHER): Payer: 59

## 2017-01-16 ENCOUNTER — Ambulatory Visit (INDEPENDENT_AMBULATORY_CARE_PROVIDER_SITE_OTHER): Payer: Medicare Other | Admitting: Internal Medicine

## 2017-01-16 ENCOUNTER — Encounter: Payer: Self-pay | Admitting: Internal Medicine

## 2017-01-16 VITALS — BP 118/82 | HR 78 | Temp 98.6°F | Ht 67.5 in | Wt 171.0 lb

## 2017-01-16 DIAGNOSIS — G4700 Insomnia, unspecified: Secondary | ICD-10-CM

## 2017-01-16 DIAGNOSIS — F329 Major depressive disorder, single episode, unspecified: Secondary | ICD-10-CM

## 2017-01-16 DIAGNOSIS — R7989 Other specified abnormal findings of blood chemistry: Secondary | ICD-10-CM | POA: Diagnosis not present

## 2017-01-16 DIAGNOSIS — F32A Depression, unspecified: Secondary | ICD-10-CM

## 2017-01-16 DIAGNOSIS — M069 Rheumatoid arthritis, unspecified: Secondary | ICD-10-CM

## 2017-01-16 DIAGNOSIS — Z0001 Encounter for general adult medical examination with abnormal findings: Secondary | ICD-10-CM | POA: Diagnosis not present

## 2017-01-16 LAB — BASIC METABOLIC PANEL
BUN: 20 mg/dL (ref 6–23)
CO2: 26 mEq/L (ref 19–32)
Calcium: 9.7 mg/dL (ref 8.4–10.5)
Chloride: 105 mEq/L (ref 96–112)
Creatinine, Ser: 1.06 mg/dL (ref 0.40–1.50)
GFR: 73.44 mL/min (ref >60.00)
Glucose, Bld: 91 mg/dL (ref 70–99)
Potassium: 4 mEq/L (ref 3.5–5.1)
Sodium: 137 mEq/L (ref 135–145)

## 2017-01-16 LAB — URINALYSIS, ROUTINE W REFLEX MICROSCOPIC
Bilirubin Urine: NEGATIVE
Ketones, ur: NEGATIVE
Leukocytes, UA: NEGATIVE
Nitrite: NEGATIVE
Specific Gravity, Urine: 1.025 (ref 1.000–1.030)
Total Protein, Urine: NEGATIVE
Urine Glucose: NEGATIVE
Urobilinogen, UA: 0.2 (ref 0.0–1.0)
pH: 6 (ref 5.0–8.0)

## 2017-01-16 LAB — CBC WITH DIFFERENTIAL/PLATELET
Basophils Absolute: 0.1 10*3/uL (ref 0.0–0.1)
Basophils Relative: 0.9 % (ref 0.0–3.0)
Eosinophils Absolute: 0.1 10*3/uL (ref 0.0–0.7)
Eosinophils Relative: 0.8 % (ref 0.0–5.0)
HCT: 45.1 % (ref 39.0–52.0)
Hemoglobin: 15.6 g/dL (ref 13.0–17.0)
Lymphocytes Relative: 31.1 % (ref 12.0–46.0)
Lymphs Abs: 2.7 10*3/uL (ref 0.7–4.0)
MCHC: 34.6 g/dL (ref 30.0–36.0)
MCV: 89.1 fl (ref 78.0–100.0)
Monocytes Absolute: 0.8 10*3/uL (ref 0.1–1.0)
Monocytes Relative: 9.3 % (ref 3.0–12.0)
Neutro Abs: 5.1 10*3/uL (ref 1.4–7.7)
Neutrophils Relative %: 57.9 % (ref 43.0–77.0)
Platelets: 281 10*3/uL (ref 150.0–400.0)
RBC: 5.07 Mil/uL (ref 4.22–5.81)
RDW: 12.8 % (ref 11.5–15.5)
WBC: 8.8 10*3/uL (ref 4.0–10.5)

## 2017-01-16 LAB — SEDIMENTATION RATE: Sed Rate: 15 mm/hr (ref 0–20)

## 2017-01-16 LAB — LIPID PANEL
Cholesterol: 223 mg/dL — ABNORMAL HIGH (ref 0–200)
HDL: 40.2 mg/dL (ref >39.00)
NonHDL: 182.62
Total CHOL/HDL Ratio: 6
Triglycerides: 268 mg/dL — ABNORMAL HIGH (ref 0.0–149.0)
VLDL: 53.6 mg/dL — ABNORMAL HIGH (ref 0.0–40.0)

## 2017-01-16 LAB — HEPATIC FUNCTION PANEL
ALT: 21 U/L (ref 0–53)
AST: 19 U/L (ref 0–37)
Albumin: 4.4 g/dL (ref 3.5–5.2)
Alkaline Phosphatase: 78 U/L (ref 39–117)
Bilirubin, Direct: 0.1 mg/dL (ref 0.0–0.3)
Total Bilirubin: 0.4 mg/dL (ref 0.2–1.2)
Total Protein: 7.5 g/dL (ref 6.0–8.3)

## 2017-01-16 LAB — LDL CHOLESTEROL, DIRECT: Direct LDL: 150 mg/dL

## 2017-01-16 LAB — TSH: TSH: 2.33 u[IU]/mL (ref 0.35–4.50)

## 2017-01-16 MED ORDER — DICLOFENAC SODIUM 1 % TD GEL: 4.0000 g | Freq: Four times a day (QID) | TRANSDERMAL | 11 refills | Status: DC | PRN

## 2017-01-16 MED ORDER — MIRTAZAPINE 30 MG PO TABS: 30.0000 mg | ORAL_TABLET | Freq: Every day | ORAL | 3 refills | Status: DC

## 2017-01-16 NOTE — Progress Notes (Signed)
Subjective:    Patient ID: Erik Lolling Sr., male    DOB: [birth year], 71 y.o.   MRN: [id number]  HPI  Here to f/u C/o insomnia - seroquel worked well initially at one pill per bedtime  But then had multiple symtpoms he thought were related - so most recently has reduced the pill and now only taking 1/4 pill, pt c/o Fatigue, blurred vision, depression, irritability, stiff joints and back pain and memory dysfunction.  All are intermittent, seem to be better and worse, better and worse, mild but gradually worsening,  Wonders if we can work on the pain primarily, mostly the hands and lower back.  Still not wanting psychitary or counseling. Willing to try remeron.  Lower back pain is left lower, Pt continues to have recurring LBP x 1-2 wks, but no bowel or bladder change, fever, wt loss,  worsening LE pain/numbness/weakness, gait change or falls.  Hands are stiff but not swollen, mostly the finger joints (no MCPs) Has RA listed on his problem list in EMR, he's not aware of this, details unclear, does not recall hand joint swelling, does not see rheumatology. Past Medical History:  Diagnosis Date  . Allergic rhinitis   . Allergy   . Anal fissure   . Anxiety   . CAD (coronary artery disease)   . Cataract    starting  . Coronary atherosclerosis of native coronary artery 2007   nonobstructive CAD by cath with 40% mid-LAD stenosis  . Depression   . Diverticulitis   . Diverticulosis 12/18/2011  . FIBROMYALGIA 06/09/2010   Qualifier: Diagnosis of  By: Carlean Purl MD, Dimas Millin Fibromyalgia   . GERD with stricture   . Helicobacter pylori (H. pylori) infection 11/25/2012   11/2012 EGD + gastric bxs  . Hiatal hernia   . HLD (hyperlipidemia)   . HTN (hypertension)   . Hyperlipidemia   . IBS (irritable bowel syndrome)   . Impotence of organic origin   . Insomnia   . Internal and external hemorrhoids without complication   . Interstitial cystitis   . INTERSTITIAL CYSTITIS 06/09/2010   Qualifier:  Diagnosis of  By: Carlean Purl MD, Dimas Millin Irritable bowel syndrome 06/09/2010   Qualifier: Diagnosis of  By: Carlean Purl MD, Dimas Millin   . Reactive hypoglycemia   . Rheumatoid arthritis(714.0)   . 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 bladder distention     x 6  . CATHETER REMOVAL     super pubic area  . COLONOSCOPY  05/10/2006   diverticulosis, internal and external hemorrhoids  . COLONOSCOPY    . INTERSTIM IMPLANT PLACEMENT    . INTERSTIM IMPLANT REMOVAL    . KNEE ARTHROSCOPY     right x 2  . NISSEN FUNDOPLICATION  0737  . SHOULDER ARTHROSCOPY  2010   left  . TONSILLECTOMY AND ADENOIDECTOMY  1957  . TRANSURETHRAL RESECTION OF PROSTATE     x 2  . UPPER GASTROINTESTINAL ENDOSCOPY  05/13/2008   hiatal hernia  . VASECTOMY      reports that he has quit smoking. He has never used smokeless tobacco. He reports that he does not drink alcohol or use drugs. family history includes Breast cancer in his sister; Colon polyps in his father; Diabetes in his father, paternal uncle, and sister; Heart disease in his father and mother; Hypertension in his father and mother; Stroke in his mother. Allergies  Allergen  Reactions  . Ambien [Zolpidem]   . Lexapro [Escitalopram Oxalate]   . Pneumovax [Pneumococcal Polysaccharide Vaccine] Other (See Comments)    pain  . Prilosec [Omeprazole] Other (See Comments)    Per patient joint pain with PPI's  . Remeron [Mirtazapine]   . Statins Other (See Comments)    Joint pain  . Tramadol Other (See Comments)   Current Outpatient Prescriptions on File Prior to Visit  Medication Sig Dispense Refill  . AMBULATORY NON FORMULARY MEDICATION Medication Name: *[facility] 12 capsules by mouth daiy    . AMBULATORY NON FORMULARY MEDICATION Medication Name: Ssuper 8 Probiotic Take 1 capsule by mouth once daily    . cyclobenzaprine (FLEXERIL) 5 MG tablet Take 1 tablet (5 mg total) by mouth 3 (three) times  daily as needed for muscle spasms. 90 tablet 3  . hydrOXYzine (ATARAX/VISTARIL) 25 MG tablet Take 1 tablet (25 mg total) by mouth 3 (three) times daily as needed. To please disregard rx for hydralazine today due to error 270 tablet 3  . LORazepam (ATIVAN) 0.5 MG tablet Take 1 tablet (0.5 mg total) by mouth 2 (two) times daily as needed. for anxiety 60 tablet 2  . tamsulosin (FLOMAX) 0.4 MG CAPS capsule Take 1 capsule (0.4 mg total) by mouth daily. 90 capsule 3  . traMADol (ULTRAM) 50 MG tablet Take 1 tablet (50 mg total) by mouth every 6 (six) hours as needed. 120 tablet 2   No current facility-administered medications on file prior to visit.    Review of Systems  Constitutional: Negative for unusual diaphoresis or night sweats HENT: Negative for ear swelling or discharge Eyes: Negative for worsening visual haziness  Respiratory: Negative for choking and stridor.   Gastrointestinal: Negative for distension or worsening eructation Genitourinary: Negative for retention or change in urine volume.  Musculoskeletal: Negative for other MSK pain or swelling Skin: Negative for color change and worsening wound Neurological: Negative for tremors and numbness other than noted  Psychiatric/Behavioral: Negative for decreased concentration or agitation other than above   All other system neg per pt    Objective:   Physical Exam BP 118/82   Pulse 78   Temp 98.6 F (37 C)   Ht 5' 7.5" (1.715 m)   Wt 171 lb (77.6 kg)   SpO2 98%   BMI 26.39 kg/m  VS noted,  Constitutional: Pt appears in no apparent distress HENT: Head: NCAT.  Right Ear: External ear normal.  Left Ear: External ear normal.  Eyes: . Pupils are equal, round, and reactive to light. Conjunctivae and EOM are normal Neck: Normal range of motion. Neck supple.  Cardiovascular: Normal rate and regular rhythm.   Pulmonary/Chest: Effort normal and breath sounds without rales or wheezing.  Abd:  Soft, NT, ND, + BS  has OA changes to  several finger joints without tenderness or swelling Spine nontender; also nontender lumbar paravertebralas Neurological: Pt is alert. Not confused , motor grossly intact Skin: Skin is warm. No rash, no LE edema Psychiatric: Pt behavior is normal. No agitation. nervous, pressured speech, flat almost monotone voice, depressed affect No other exam findings    Assessment & Plan:

## 2017-01-16 NOTE — Patient Instructions (Addendum)
Ok to stop seroquel  Please take all new medication as prescribed - the remeron for sleep and depression, as well as the voltaren gel for hand pain  Please continue all other medications as before, and refills have been done if requested.  Please have the pharmacy call with any other refills you may need.  Please keep your appointments with your specialists as you may have planned

## 2017-01-17 LAB — RHEUMATOID FACTOR: Rheumatoid fact SerPl-aCnc: <14 IU/mL (ref <14)

## 2017-01-17 NOTE — Assessment & Plan Note (Signed)
likely his primary issue in the setting of insomnia, will try remeron 30 qhs, d/c seroquel, consider trazodone or even depakote if not helping, may need psychiatric consult but declines

## 2017-01-17 NOTE — Assessment & Plan Note (Signed)
Chronic persistent, better and worse, will try remeron 30 qhs,  to f/u any worsening symptoms or concerns

## 2017-01-17 NOTE — Assessment & Plan Note (Addendum)
Dx unclear, exam now definitive, for rheum labs, declines prednisone trial, will try voltaren gel prn hand pain, tylenol for lower back

## 2017-01-18 LAB — ANA: Anti Nuclear Antibody(ANA): NEGATIVE

## 2017-01-24 ENCOUNTER — Other Ambulatory Visit: Payer: Self-pay | Admitting: Internal Medicine

## 2017-01-24 NOTE — Telephone Encounter (Signed)
Done hardcopy to Anna  

## 2017-01-24 NOTE — Telephone Encounter (Signed)
Send and faxed to pharmacy

## 2017-01-29 ENCOUNTER — Encounter: Payer: Self-pay | Admitting: Internal Medicine

## 2017-01-29 ENCOUNTER — Telehealth: Payer: Self-pay

## 2017-01-29 NOTE — Telephone Encounter (Signed)
I recd message from dr Jenny Reichmann that patient has been trying to get thru to [facility]---I have called and left message for that dept to call patient back---I will try again tomorrow to make sure someone has recd this patient's request---I feel their office is already closed but I did leave message

## 2017-01-29 NOTE — Telephone Encounter (Signed)
Jonelle Sidle to assist please

## 2017-01-31 DIAGNOSIS — R3914 Feeling of incomplete bladder emptying: Secondary | ICD-10-CM | POA: Diagnosis not present

## 2017-01-31 DIAGNOSIS — R339 Retention of urine, unspecified: Secondary | ICD-10-CM | POA: Diagnosis not present

## 2017-02-04 ENCOUNTER — Other Ambulatory Visit: Payer: Self-pay

## 2017-02-04 MED ORDER — MIRTAZAPINE 30 MG PO TABS: 30.0000 mg | ORAL_TABLET | Freq: Every day | ORAL | 3 refills | Status: DC

## 2017-02-15 DIAGNOSIS — R3914 Feeling of incomplete bladder emptying: Secondary | ICD-10-CM | POA: Diagnosis not present

## 2017-02-15 DIAGNOSIS — R339 Retention of urine, unspecified: Secondary | ICD-10-CM | POA: Diagnosis not present

## 2017-02-26 ENCOUNTER — Ambulatory Visit (INDEPENDENT_AMBULATORY_CARE_PROVIDER_SITE_OTHER): Payer: 59 | Admitting: Psychiatry

## 2017-02-26 DIAGNOSIS — F324 Major depressive disorder, single episode, in partial remission: Secondary | ICD-10-CM

## 2017-03-07 DIAGNOSIS — M23203 Derangement of unspecified medial meniscus due to old tear or injury, right knee: Secondary | ICD-10-CM | POA: Diagnosis not present

## 2017-03-24 DIAGNOSIS — N390 Urinary tract infection, site not specified: Secondary | ICD-10-CM | POA: Diagnosis not present

## 2017-03-24 DIAGNOSIS — R300 Dysuria: Secondary | ICD-10-CM | POA: Diagnosis not present

## 2017-03-26 ENCOUNTER — Encounter (HOSPITAL_COMMUNITY): Payer: Self-pay

## 2017-03-26 ENCOUNTER — Other Ambulatory Visit: Payer: Self-pay

## 2017-03-26 ENCOUNTER — Emergency Department (HOSPITAL_COMMUNITY): Payer: Medicare Other

## 2017-03-26 ENCOUNTER — Encounter: Payer: Self-pay | Admitting: Internal Medicine

## 2017-03-26 ENCOUNTER — Emergency Department (HOSPITAL_COMMUNITY)
Admission: EM | Admit: 2017-03-26 | Discharge: 2017-03-26 | Disposition: A | Discharge status: Home / Self Care | Payer: Medicare Other | Attending: Emergency Medicine | Admitting: Emergency Medicine

## 2017-03-26 DIAGNOSIS — R319 Hematuria, unspecified: Secondary | ICD-10-CM | POA: Diagnosis not present

## 2017-03-26 DIAGNOSIS — Z87891 Personal history of nicotine dependence: Secondary | ICD-10-CM | POA: Insufficient documentation

## 2017-03-26 DIAGNOSIS — Z79899 Other long term (current) drug therapy: Secondary | ICD-10-CM | POA: Insufficient documentation

## 2017-03-26 DIAGNOSIS — E860 Dehydration: Secondary | ICD-10-CM | POA: Diagnosis not present

## 2017-03-26 DIAGNOSIS — R079 Chest pain, unspecified: Secondary | ICD-10-CM | POA: Insufficient documentation

## 2017-03-26 DIAGNOSIS — R531 Weakness: Secondary | ICD-10-CM | POA: Diagnosis present

## 2017-03-26 DIAGNOSIS — I2510 Atherosclerotic heart disease of native coronary artery without angina pectoris: Secondary | ICD-10-CM | POA: Insufficient documentation

## 2017-03-26 DIAGNOSIS — I10 Essential (primary) hypertension: Secondary | ICD-10-CM | POA: Diagnosis not present

## 2017-03-26 DIAGNOSIS — N390 Urinary tract infection, site not specified: Secondary | ICD-10-CM | POA: Diagnosis not present

## 2017-03-26 LAB — CBC
HCT: 47.6 % (ref 39.0–52.0)
Hemoglobin: 16.8 g/dL (ref 13.0–17.0)
MCH: 31.6 pg (ref 26.0–34.0)
MCHC: 35.3 g/dL (ref 30.0–36.0)
MCV: 89.5 fL (ref 78.0–100.0)
Platelets: 251 10*3/uL (ref 150–400)
RBC: 5.32 MIL/uL (ref 4.22–5.81)
RDW: 12.7 % (ref 11.5–15.5)
WBC: 9.1 10*3/uL (ref 4.0–10.5)

## 2017-03-26 LAB — URINALYSIS, ROUTINE W REFLEX MICROSCOPIC
Bilirubin Urine: NEGATIVE
Glucose, UA: NEGATIVE mg/dL
Ketones, ur: NEGATIVE mg/dL
Nitrite: NEGATIVE
Protein, ur: NEGATIVE mg/dL
Specific Gravity, Urine: 1.014 (ref 1.005–1.030)
Squamous Epithelial / LPF: NONE SEEN
pH: 5 (ref 5.0–8.0)

## 2017-03-26 LAB — I-STAT TROPONIN, ED
Troponin i, poc: 0 ng/mL (ref 0.00–0.08)
Troponin i, poc: 0 ng/mL (ref 0.00–0.08)

## 2017-03-26 LAB — BASIC METABOLIC PANEL
Anion gap: 10 (ref 5–15)
BUN: 23 mg/dL — ABNORMAL HIGH (ref 6–20)
CO2: 21 mmol/L — ABNORMAL LOW (ref 22–32)
Calcium: 9 mg/dL (ref 8.9–10.3)
Chloride: 104 mmol/L (ref 101–111)
Creatinine, Ser: 1.41 mg/dL — ABNORMAL HIGH (ref 0.61–1.24)
GFR calc Af Amer: 57 mL/min — ABNORMAL LOW (ref >60)
GFR calc non Af Amer: 49 mL/min — ABNORMAL LOW (ref >60)
Glucose, Bld: 102 mg/dL — ABNORMAL HIGH (ref 65–99)
Potassium: 3.9 mmol/L (ref 3.5–5.1)
Sodium: 135 mmol/L (ref 135–145)

## 2017-03-26 LAB — LIPASE, BLOOD: Lipase: 30 U/L (ref 11–51)

## 2017-03-26 MED ORDER — CEPHALEXIN 500 MG PO CAPS: 500.0000 mg | ORAL_CAPSULE | Freq: Four times a day (QID) | ORAL | 0 refills | Status: DC

## 2017-03-26 MED ORDER — RANITIDINE HCL 150 MG/10ML PO SYRP
150.0000 mg | ORAL_SOLUTION | Freq: Once | ORAL | Status: AC
  Administered 2017-03-26: 150 mg via ORAL
  Filled 2017-03-26: qty 10

## 2017-03-26 MED ORDER — GI COCKTAIL ~~LOC~~
30.0000 mL | Freq: Once | ORAL | Status: AC
Start: 2017-03-26 — End: 2017-03-26
  Administered 2017-03-26: 30 mL via ORAL
  Filled 2017-03-26: qty 30

## 2017-03-26 MED ORDER — DEXTROSE 5 % IV SOLN
1.0000 g | Freq: Once | INTRAVENOUS | Status: AC
  Administered 2017-03-26: 1 g via INTRAVENOUS
  Filled 2017-03-26: qty 10

## 2017-03-26 MED ORDER — SODIUM CHLORIDE 0.9 % IV BOLUS (SEPSIS)
1000.0000 mL | Freq: Once | INTRAVENOUS | Status: AC
  Administered 2017-03-26: 1000 mL via INTRAVENOUS

## 2017-03-26 MED ORDER — ASPIRIN 81 MG PO CHEW
324.0000 mg | CHEWABLE_TABLET | Freq: Once | ORAL | Status: AC
  Administered 2017-03-26: 324 mg via ORAL
  Filled 2017-03-26: qty 4

## 2017-03-26 MED ORDER — RANITIDINE HCL 150 MG PO TABS: 150.0000 mg | ORAL_TABLET | Freq: Two times a day (BID) | ORAL | 0 refills | Status: DC

## 2017-03-26 MED ORDER — ONDANSETRON HCL 4 MG/2ML IJ SOLN
4.0000 mg | Freq: Once | INTRAMUSCULAR | Status: AC
  Administered 2017-03-26: 4 mg via INTRAVENOUS
  Filled 2017-03-26: qty 2

## 2017-03-26 NOTE — ED Provider Notes (Signed)
[facility] DEPT Provider Note   CSN: [id number] Arrival date & time: 03/26/17  1426     History   Chief Complaint Chief Complaint  Patient presents with  . Chest Pain  . Dizziness    HPI Erik JOOS Sr. is a 71 y.o. male.  HPI  71 year old male with multiple medical complaints today but the acute complaint seems to be overall weakness and dizziness. Patient states he has chronic history of reflux, dysphagia no.aphasia but has had weakness and dizziness since Saturday. He went saw someone at urgent care on Sunday got started on ciprofloxacin for UTI apparently felt a little better yesterday but then today was feeling weak again. He states he had one episode of a sharp chest pain last abuse episodes of reflux artery has had a Nissen artery done so he is unsure if he really still has reflux. Currently patient only complains of weakness and not really have any lightheadedness, vertigo or near syncopal symptoms he describes dizziness just is been weak. No cough. He has had some intermittent nausea that has decreased his oral intake. No other symptoms. No other associated or modifying factors.  Past Medical History:  Diagnosis Date  . Allergic rhinitis   . Allergy   . Anal fissure   . Anxiety   . CAD (coronary artery disease)   . Cataract    starting  . Coronary atherosclerosis of native coronary artery 2007   nonobstructive CAD by cath with 40% mid-LAD stenosis  . Depression   . Diverticulitis   . Diverticulosis 12/18/2011  . FIBROMYALGIA 06/09/2010   Qualifier: Diagnosis of  By: Carlean Purl MD, Dimas Millin Fibromyalgia   . GERD with stricture   . Helicobacter pylori (H. pylori) infection 11/25/2012   11/2012 EGD + gastric bxs  . Hiatal hernia   . HLD (hyperlipidemia)   . HTN (hypertension)   . Hyperlipidemia   . IBS (irritable bowel syndrome)   . Impotence of organic origin   . Insomnia   . Internal and external hemorrhoids without complication   . Interstitial  cystitis   . INTERSTITIAL CYSTITIS 06/09/2010   Qualifier: Diagnosis of  By: Carlean Purl MD, Dimas Millin Irritable bowel syndrome 06/09/2010   Qualifier: Diagnosis of  By: Carlean Purl MD, Dimas Millin   . Reactive hypoglycemia   . Rheumatoid arthritis(714.0)   . 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 Acute sinus infection 11/18/2016  . Senile purpura ([place]) 10/14/2016  . Medication intolerances 06/26/2016  . Dyspepsia 06/26/2016  . Hyperglycemia 03/30/2016  . BPH (benign prostatic hypertrophy) with urinary obstruction 03/30/2016  . Abnormal CT of the abdomen 11/10/2015  . Right leg pain 09/01/2015  . Left shoulder pain 09/01/2015  . Fatigue 06/28/2015  . Bladder pain 03/25/2015  . Polyarthralgia 02/02/2015  . Abdominal pain, epigastric 02/01/2015  . Bloating 02/01/2015  . Bradycardia with 31 - 40 beats per minute 06/18/2014  . Chest pain 03/16/2013  . Hyperlipidemia 02/18/2013  . Helicobacter pylori (H. pylori) infection 11/25/2012  . 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 Encounter for preventative adult health care exam with abnormal findings 02/23/2012  . Allergic rhinitis   . Rheumatoid arthritis ([place])   . Insomnia   . Anxiety   . CAD (coronary artery disease)   . Diverticulitis   . GERD with stricture   . IBS (irritable bowel syndrome)   . Helicobacter pylori gastritis   .  Depression   . Internal and external hemorrhoids without complication   . Irritable bowel syndrome 06/09/2010  . Interstitial cystitis 06/09/2010  . FIBROMYALGIA 06/09/2010  . FLATULENCE-GAS-BLOATING 06/09/2010    Past Surgical History:  Procedure Laterality Date  . bladder distention     x 6  . CATHETER REMOVAL     super pubic area  . COLONOSCOPY  05/10/2006   diverticulosis, internal and external hemorrhoids  . COLONOSCOPY    . INTERSTIM IMPLANT PLACEMENT    . INTERSTIM IMPLANT REMOVAL    . KNEE ARTHROSCOPY     right x 2  . NISSEN  FUNDOPLICATION  9024  . SHOULDER ARTHROSCOPY  2010   left  . TONSILLECTOMY AND ADENOIDECTOMY  1957  . TRANSURETHRAL RESECTION OF PROSTATE     x 2  . UPPER GASTROINTESTINAL ENDOSCOPY  05/13/2008   hiatal hernia  . VASECTOMY         Home Medications    Prior to Admission medications   Medication Sig Start Date End Date Taking? Authorizing Provider  ciprofloxacin (CIPRO) 500 MG tablet Take 500 mg by mouth 2 (two) times daily.  03/24/17  Yes [provider]  cyclobenzaprine (FLEXERIL) 5 MG tablet Take 1 tablet (5 mg total) by mouth 3 (three) times daily as needed for muscle spasms. 10/10/16  Yes [name], MD  hydrOXYzine (ATARAX/VISTARIL) 25 MG tablet Take 1 tablet (25 mg total) by mouth 3 (three) times daily as needed. To please disregard rx for hydralazine today due to error Patient taking differently: Take 25 mg by mouth 3 (three) times daily as needed for anxiety.  10/10/16  Yes [name], MD  LORazepam (ATIVAN) 0.5 MG tablet TAKE 1 TABLET BY MOUTH TWICE DAILY AS NEEDED FOR ANXIETY 01/24/17  Yes [name], MD  traMADol (ULTRAM) 50 MG tablet Take 1 tablet (50 mg total) by mouth every 6 (six) hours as needed. Patient taking differently: Take 50 mg by mouth every 6 (six) hours as needed for moderate pain or severe pain.  10/10/16  Yes [name], MD  cephALEXin (KEFLEX) 500 MG capsule Take 1 capsule (500 mg total) by mouth 4 (four) times daily. 03/26/17   Renleigh Ouellet, Corene Cornea, MD  ranitidine (ZANTAC) 150 MG tablet Take 1 tablet (150 mg total) by mouth 2 (two) times daily. 03/26/17   Aerion Bagdasarian, Corene Cornea, MD    Family History Family History  Problem Relation Age of Onset  . Colon polyps Father   . Diabetes Father   . Heart disease Father   . Hypertension Father   . Heart disease Mother   . Hypertension Mother   . Stroke Mother   . Breast cancer Sister   . Diabetes Sister   . Diabetes Paternal Uncle   . Colon cancer Neg Hx   . Esophageal cancer Neg Hx   . Rectal cancer Neg  Hx   . Stomach cancer Neg Hx     Social History Social History  Substance Use Topics  . Smoking status: Former Research scientist (life sciences)  . Smokeless tobacco: Never Used     Comment: quit 1980  . Alcohol use No     Allergies   Ambien [zolpidem]; Lexapro [escitalopram oxalate]; Pneumovax [pneumococcal polysaccharide vaccine]; Prilosec [omeprazole]; Remeron [mirtazapine]; Statins; and Tramadol   Review of Systems Review of Systems  All other systems reviewed and are negative.    Physical Exam Updated Vital Signs BP (!) 157/73   Pulse (!) 58   Temp 98.3 F (36.8 C) (  Oral)   Resp (!) 21   Ht 5' 7.5" (1.715 m)   Wt 172 lb (78 kg)   SpO2 98%   BMI 26.54 kg/m   Physical Exam  Constitutional: He is oriented to person, place, and time. He appears well-developed and well-nourished.  HENT:  Head: Normocephalic and atraumatic.  Eyes: Conjunctivae and EOM are normal.  Neck: Normal range of motion.  Cardiovascular: Normal rate.   Pulmonary/Chest: Effort normal. No respiratory distress.  Abdominal: He exhibits no distension.  Musculoskeletal: Normal range of motion. He exhibits no edema or deformity.  Neurological: He is alert and oriented to person, place, and time.  No altered mental status, able to give full seemingly accurate history.  Face is symmetric, EOM's intact, pupils equal and reactive, vision intact, tongue and uvula midline without deviation Upper and Lower extremity motor 5/5, intact pain perception in distal extremities, 2+ reflexes in biceps, patella and achilles tendons.  Nursing note and vitals reviewed.    ED Treatments / Results  Labs (all labs ordered are listed, but only abnormal results are displayed) Labs Reviewed  BASIC METABOLIC PANEL - Abnormal; Notable for the following:       Result Value   CO2 21 (*)    Glucose, Bld 102 (*)    BUN 23 (*)    Creatinine, Ser 1.41 (*)    GFR calc non Af Amer 49 (*)    GFR calc Af Amer 57 (*)    All other components within  normal limits  URINALYSIS, ROUTINE W REFLEX MICROSCOPIC - Abnormal; Notable for the following:    Hgb urine dipstick SMALL (*)    Leukocytes, UA SMALL (*)    Bacteria, UA RARE (*)    All other components within normal limits  URINE CULTURE  CBC  LIPASE, BLOOD  I-STAT TROPOININ, ED  I-STAT TROPOININ, ED    EKG  EKG Interpretation None        My ECG Read Indication:chest pain, weakness EKG was personally contemporaneously reviewed by myself. Rate: 88 PR Interval: 149 QRS duration: 95 QT/QTC: 355/430 Axis: normal EKG: normal EKG, normal sinus rhythm. Other significant findings: none   Radiology Dg Chest 2 View  Result Date: 03/26/2017 CLINICAL DATA:  Intermittent sharp burning lower and mid chest pain which began last night. Three days of intermittent dizziness. Former smoker. History of coronary artery disease, former smoker. EXAM: CHEST  2 VIEW COMPARISON:  PA and lateral chest x-ray of November 08, 2009 FINDINGS: The lungs are reasonably well inflated. There is no focal infiltrate. There is no pleural effusion. The heart and pulmonary vascularity are normal. The mediastinum is normal in width. The trachea is midline. 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 IMPRESSION: There is no acute cardiopulmonary abnormality. Mild multilevel degenerative disc disease of the thoracic spine. Electronically Signed   By: David  Martinique M.D.   On: 03/26/2017 15:34    Procedures Procedures (including critical care time)  Medications Ordered in ED Medications  gi cocktail (Maalox,Lidocaine,Donnatal) (30 mLs Oral Given 03/26/17 1530)  aspirin chewable tablet 324 mg (324 mg Oral Given 03/26/17 1530)  sodium chloride 0.9 % bolus 1,000 mL (0 mLs Intravenous Stopped 03/26/17 1746)  sodium chloride 0.9 % bolus 1,000 mL (0 mLs Intravenous Stopped 03/26/17 1902)  cefTRIAXone (ROCEPHIN) 1 g in dextrose 5 % 50 mL IVPB (0 g Intravenous Stopped 03/26/17 1902)  ondansetron  (ZOFRAN) injection 4 mg (4 mg Intravenous Given 03/26/17 1747)  ranitidine (ZANTAC) 150 MG/10ML  syrup 150 mg (150 mg Oral Given 03/26/17 2133)     Initial Impression / Assessment and Plan / ED Course  I have reviewed the triage vital signs and the nursing notes.  Pertinent labs & imaging results that were available during my care of the patient were reviewed by me and considered in my medical decision making (see chart for details).     71 year old male with multiple issues. Suspect he is likely dehydrated as the main cause for her symptoms here. His UTI does not appear like it's been treated well with ciprofloxacin so switched him over to Keflex. Chest pain is unlikely to be cardiac related with negative delta troponins and no change in EKG. Likely gastritis vs reflux  Final Clinical Impressions(s) / ED Diagnoses   Final diagnoses:  Dehydration  Chest pain, unspecified type  Urinary tract infection with hematuria, site unspecified    New Prescriptions Discharge Medication List as of 03/26/2017  9:29 PM    START taking these medications   Details  cephALEXin (KEFLEX) 500 MG capsule Take 1 capsule (500 mg total) by mouth 4 (four) times daily., Starting Tue 03/26/2017, Print    ranitidine (ZANTAC) 150 MG tablet Take 1 tablet (150 mg total) by mouth 2 (two) times daily., Starting Tue 03/26/2017, Print         Quayshaun Hubbert, Corene Cornea, MD 03/27/17 [phone]

## 2017-03-26 NOTE — Telephone Encounter (Signed)
Shirron to please inform pt, needs to go to ED for evaluation for CP

## 2017-03-26 NOTE — ED Notes (Signed)
Pt ambulate in the hall way with no promblem

## 2017-03-26 NOTE — ED Triage Notes (Signed)
Patient c/o intermittent sharp burning lower mid chest pain since last night. Patient c/o intermittent dizziness x 3 days.

## 2017-03-27 DIAGNOSIS — M25562 Pain in left knee: Secondary | ICD-10-CM | POA: Diagnosis not present

## 2017-03-27 DIAGNOSIS — M25561 Pain in right knee: Secondary | ICD-10-CM | POA: Diagnosis not present

## 2017-03-27 DIAGNOSIS — M170 Bilateral primary osteoarthritis of knee: Secondary | ICD-10-CM | POA: Diagnosis not present

## 2017-03-28 LAB — URINE CULTURE: Culture: NO GROWTH

## 2017-03-29 DIAGNOSIS — R3914 Feeling of incomplete bladder emptying: Secondary | ICD-10-CM | POA: Diagnosis not present

## 2017-03-29 DIAGNOSIS — R339 Retention of urine, unspecified: Secondary | ICD-10-CM | POA: Diagnosis not present

## 2017-04-02 ENCOUNTER — Other Ambulatory Visit (INDEPENDENT_AMBULATORY_CARE_PROVIDER_SITE_OTHER): Payer: Medicare Other

## 2017-04-02 ENCOUNTER — Ambulatory Visit (INDEPENDENT_AMBULATORY_CARE_PROVIDER_SITE_OTHER): Payer: Medicare Other | Admitting: Internal Medicine

## 2017-04-02 ENCOUNTER — Encounter: Payer: Self-pay | Admitting: Internal Medicine

## 2017-04-02 ENCOUNTER — Other Ambulatory Visit: Payer: Self-pay | Admitting: Internal Medicine

## 2017-04-02 VITALS — BP 130/88 | HR 68 | Ht 67.0 in | Wt 165.0 lb

## 2017-04-02 DIAGNOSIS — Z8262 Family history of osteoporosis: Secondary | ICD-10-CM

## 2017-04-02 DIAGNOSIS — Z0000 Encounter for general adult medical examination without abnormal findings: Secondary | ICD-10-CM

## 2017-04-02 DIAGNOSIS — J309 Allergic rhinitis, unspecified: Secondary | ICD-10-CM

## 2017-04-02 DIAGNOSIS — N179 Acute kidney failure, unspecified: Secondary | ICD-10-CM | POA: Diagnosis not present

## 2017-04-02 DIAGNOSIS — E2839 Other primary ovarian failure: Secondary | ICD-10-CM

## 2017-04-02 LAB — URINALYSIS, ROUTINE W REFLEX MICROSCOPIC
Bilirubin Urine: NEGATIVE
Hgb urine dipstick: NEGATIVE
Ketones, ur: NEGATIVE
Leukocytes, UA: NEGATIVE
Nitrite: NEGATIVE
RBC / HPF: NONE SEEN (ref 0–?)
Specific Gravity, Urine: <=1.005 — AB (ref 1.000–1.030)
Total Protein, Urine: NEGATIVE
Urine Glucose: NEGATIVE
Urobilinogen, UA: 0.2 (ref 0.0–1.0)
WBC, UA: NONE SEEN (ref 0–?)
pH: 6 (ref 5.0–8.0)

## 2017-04-02 LAB — CBC WITH DIFFERENTIAL/PLATELET
Basophils Absolute: 0.1 10*3/uL (ref 0.0–0.1)
Basophils Relative: 0.9 % (ref 0.0–3.0)
Eosinophils Absolute: 0.1 10*3/uL (ref 0.0–0.7)
Eosinophils Relative: 1.5 % (ref 0.0–5.0)
HCT: 45.8 % (ref 39.0–52.0)
Hemoglobin: 15.7 g/dL (ref 13.0–17.0)
Lymphocytes Relative: 35.4 % (ref 12.0–46.0)
Lymphs Abs: 3 10*3/uL (ref 0.7–4.0)
MCHC: 34.4 g/dL (ref 30.0–36.0)
MCV: 89.8 fl (ref 78.0–100.0)
Monocytes Absolute: 0.7 10*3/uL (ref 0.1–1.0)
Monocytes Relative: 7.8 % (ref 3.0–12.0)
Neutro Abs: 4.6 10*3/uL (ref 1.4–7.7)
Neutrophils Relative %: 54.4 % (ref 43.0–77.0)
Platelets: 371 10*3/uL (ref 150.0–400.0)
RBC: 5.1 Mil/uL (ref 4.22–5.81)
RDW: 13 % (ref 11.5–15.5)
WBC: 8.5 10*3/uL (ref 4.0–10.5)

## 2017-04-02 LAB — BASIC METABOLIC PANEL
BUN: 18 mg/dL (ref 6–23)
CO2: 29 mEq/L (ref 19–32)
Calcium: 9.5 mg/dL (ref 8.4–10.5)
Chloride: 103 mEq/L (ref 96–112)
Creatinine, Ser: 1.2 mg/dL (ref 0.40–1.50)
GFR: 63.61 mL/min (ref >60.00)
Glucose, Bld: 103 mg/dL — ABNORMAL HIGH (ref 70–99)
Potassium: 4.7 mEq/L (ref 3.5–5.1)
Sodium: 139 mEq/L (ref 135–145)

## 2017-04-02 LAB — HEPATIC FUNCTION PANEL
ALT: 26 U/L (ref 0–53)
AST: 18 U/L (ref 0–37)
Albumin: 4.4 g/dL (ref 3.5–5.2)
Alkaline Phosphatase: 73 U/L (ref 39–117)
Bilirubin, Direct: 0.1 mg/dL (ref 0.0–0.3)
Total Bilirubin: 0.6 mg/dL (ref 0.2–1.2)
Total Protein: 7.3 g/dL (ref 6.0–8.3)

## 2017-04-02 LAB — TSH: TSH: 2.42 u[IU]/mL (ref 0.35–4.50)

## 2017-04-02 LAB — LIPID PANEL
Cholesterol: 223 mg/dL — ABNORMAL HIGH (ref 0–200)
HDL: 41.5 mg/dL (ref >39.00)
LDL Cholesterol: 153 mg/dL — ABNORMAL HIGH (ref 0–99)
NonHDL: 181.98
Total CHOL/HDL Ratio: 5
Triglycerides: 145 mg/dL (ref 0.0–149.0)
VLDL: 29 mg/dL (ref 0.0–40.0)

## 2017-04-02 LAB — HEMOGLOBIN A1C: Hgb A1c MFr Bld: 6.1 % (ref 4.6–6.5)

## 2017-04-02 LAB — PSA: PSA: 4.61 ng/mL — ABNORMAL HIGH (ref 0.10–4.00)

## 2017-04-02 MED ORDER — DOXYCYCLINE HYCLATE 100 MG PO TABS: 100.0000 mg | ORAL_TABLET | Freq: Two times a day (BID) | ORAL | 0 refills | Status: DC

## 2017-04-02 MED ORDER — HYDROXYZINE HCL 25 MG PO TABS
25.0000 mg | ORAL_TABLET | Freq: Three times a day (TID) | ORAL | 3 refills | Status: DC | PRN
Start: 2017-04-02 — End: 2017-11-29

## 2017-04-02 MED ORDER — CYCLOBENZAPRINE HCL 5 MG PO TABS
5.0000 mg | ORAL_TABLET | Freq: Three times a day (TID) | ORAL | 3 refills | Status: DC | PRN
Start: 2017-04-02 — End: 2017-10-10

## 2017-04-02 NOTE — Telephone Encounter (Signed)
rx sent to [facility] [place]

## 2017-04-02 NOTE — Patient Instructions (Addendum)
OK to try the Allegra and Nasacort OTC for allergies and congestion  Please call if you would want referral to allergist  Please continue all other medications as before, and refills have been done if requested.  Please have the pharmacy call with any other refills you may need.  Please continue your efforts at being more active, low cholesterol diet, and weight control.  You are otherwise up to date with prevention measures today.  Please keep your appointments with your specialists as you may have planned  Please schedule the bone density test before leaving today at the scheduling desk (where you check out)  Your labs were drawn this morning  You will be contacted by phone if any changes need to be made immediately.  Otherwise, you will receive a letter about your results with an explanation, but please check with MyChart first.  Please remember to sign up for MyChart if you have not done so, as this will be important to you in the future with finding out test results, communicating by private email, and scheduling acute appointments online when needed.  Please return in 6 months, or sooner if needed

## 2017-04-02 NOTE — Assessment & Plan Note (Signed)
Ok for [employer],  to f/u any worsening symptoms or concerns

## 2017-04-02 NOTE — Assessment & Plan Note (Signed)
[place] for DXA

## 2017-04-02 NOTE — Assessment & Plan Note (Signed)

## 2017-04-02 NOTE — Progress Notes (Signed)
Subjective:    Patient ID: Erik Lolling Sr., male    DOB: 1946/08/06, 71 y.o.   MRN: [id number]  HPI  Here for wellness and f/u;  Overall doing ok;  Pt denies Chest pain, worsening SOB, DOE, wheezing, orthopnea, PND, worsening LE edema, palpitations, dizziness or syncope.  Pt denies neurological change such as new headache, facial or extremity weakness.  Pt denies polydipsia, polyuria, or low sugar symptoms. Pt states overall good compliance with treatment and medications, good tolerability, and has been trying to follow appropriate diet.  Pt denies worsening depressive symptoms, suicidal ideation or panic. No fever, night sweats, wt loss, loss of appetite, or other constitutional symptoms.  Pt states good ability with ADL's, has low fall risk, home safety reviewed and adequate, no other significant changes in hearing or vision, and only occasionally active with exercise. Finishing keflex for UTi today  Has constant clearin of the throat due toi allergies and post nasal gtt.  Also mentions strong FH of mult cousins with osteoporosis, and asks for DXA.   Past Medical History:  Diagnosis Date  . Allergic rhinitis   . Allergy   . Anal fissure   . Anxiety   . CAD (coronary artery disease)   . Cataract    starting  . Coronary atherosclerosis of native coronary artery 2007   nonobstructive CAD by cath with 40% mid-LAD stenosis  . Depression   . Diverticulitis   . Diverticulosis 12/18/2011  . FIBROMYALGIA 06/09/2010   Qualifier: Diagnosis of  By: Carlean Purl MD, Dimas Millin Fibromyalgia   . GERD with stricture   . Helicobacter pylori (H. pylori) infection 11/25/2012   11/2012 EGD + gastric bxs  . Hiatal hernia   . HLD (hyperlipidemia)   . HTN (hypertension)   . Hyperlipidemia   . IBS (irritable bowel syndrome)   . Impotence of organic origin   . Insomnia   . Internal and external hemorrhoids without complication   . Interstitial cystitis   . INTERSTITIAL CYSTITIS 06/09/2010   Qualifier:  Diagnosis of  By: Carlean Purl MD, Dimas Millin Irritable bowel syndrome 06/09/2010   Qualifier: Diagnosis of  By: Carlean Purl MD, Dimas Millin   . Reactive hypoglycemia   . Rheumatoid arthritis(714.0)   . 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 bladder distention     x 6  . CATHETER REMOVAL     super pubic area  . COLONOSCOPY  05/10/2006   diverticulosis, internal and external hemorrhoids  . COLONOSCOPY    . INTERSTIM IMPLANT PLACEMENT    . INTERSTIM IMPLANT REMOVAL    . KNEE ARTHROSCOPY     right x 2  . NISSEN FUNDOPLICATION  2542  . SHOULDER ARTHROSCOPY  2010   left  . TONSILLECTOMY AND ADENOIDECTOMY  1957  . TRANSURETHRAL RESECTION OF PROSTATE     x 2  . UPPER GASTROINTESTINAL ENDOSCOPY  05/13/2008   hiatal hernia  . VASECTOMY      reports that he has quit smoking. He has never used smokeless tobacco. He reports that he does not drink alcohol or use drugs. family history includes Breast cancer in his sister; Colon polyps in his father; Diabetes in his father, paternal uncle, and sister; Heart disease in his father and mother; Hypertension in his father and mother; Stroke in his mother. Allergies  Allergen Reactions  . Ambien [Zolpidem]   . Lexapro [Escitalopram Oxalate]   .  Pneumovax [Pneumococcal Polysaccharide Vaccine] Other (See Comments)    pain  . Prilosec [Omeprazole] Other (See Comments)    Per patient joint pain with PPI's  . Remeron [Mirtazapine]   . Statins Other (See Comments)    Joint pain  . Tramadol Other (See Comments)   Current Outpatient Prescriptions on File Prior to Visit  Medication Sig Dispense Refill  . cephALEXin (KEFLEX) 500 MG capsule Take 1 capsule (500 mg total) by mouth 4 (four) times daily. 28 capsule 0  . LORazepam (ATIVAN) 0.5 MG tablet TAKE 1 TABLET BY MOUTH TWICE DAILY AS NEEDED FOR ANXIETY 60 tablet 2  . ranitidine (ZANTAC) 150 MG tablet Take 1 tablet (150 mg total) by mouth 2 (two) times  daily. 60 tablet 0  . traMADol (ULTRAM) 50 MG tablet Take 1 tablet (50 mg total) by mouth every 6 (six) hours as needed. (Patient taking differently: Take 50 mg by mouth every 6 (six) hours as needed for moderate pain or severe pain. ) 120 tablet 2   No current facility-administered medications on file prior to visit.    Review of Systems Constitutional: Negative for other unusual diaphoresis, sweats, appetite or weight changes HENT: Negative for other worsening hearing loss, ear pain, facial swelling, mouth sores or neck stiffness.   Eyes: Negative for other worsening pain, redness or other visual disturbance.  Respiratory: Negative for other stridor or swelling Cardiovascular: Negative for other palpitations or other chest pain  Gastrointestinal: Negative for worsening diarrhea or loose stools, blood in stool, distention or other pain Genitourinary: Negative for hematuria, flank pain or other change in urine volume.  Musculoskeletal: Negative for myalgias or other joint swelling.  Skin: Negative for other color change, or other wound or worsening drainage.  Neurological: Negative for other syncope or numbness. Hematological: Negative for other adenopathy or swelling Psychiatric/Behavioral: Negative for hallucinations, other worsening agitation, SI, self-injury, or new decreased concentration All other system neg per pt    Objective:   Physical Exam BP 130/88   Pulse 68   Ht 5\' 7"  (1.702 m)   Wt 165 lb (74.8 kg)   SpO2 98%   BMI 25.84 kg/m  VS noted,  Constitutional: Pt is oriented to person, place, and time. Appears well-developed and well-nourished, in no significant distress and comfortable Head: Normocephalic and atraumatic  Eyes: Conjunctivae and EOM are normal. Pupils are equal, round, and reactive to light Right Ear: External ear normal without discharge Left Ear: External ear normal without discharge Nose: Nose without discharge or deformity Mouth/Throat: Oropharynx is  without other ulcerations and moist  Neck: Normal range of motion. Neck supple. No JVD present. No tracheal deviation present or significant neck LA or mass Cardiovascular: Normal rate, regular rhythm, normal heart sounds and intact distal pulses.   Pulmonary/Chest: WOB normal and breath sounds without rales or wheezing  Abdominal: Soft. Bowel sounds are normal. NT. No HSM  Musculoskeletal: Normal range of motion. Exhibits no edema Lymphadenopathy: Has no other cervical adenopathy.  Neurological: Pt is alert and oriented to person, place, and time. Pt has normal reflexes. No cranial nerve deficit. Motor grossly intact, Gait intact Skin: Skin is warm and dry. No rash noted or new ulcerations Psychiatric:  Has nervious mood and affect. Behavior is normal without agitation No other exam findings Lab Results  Component Value Date   WBC 9.1 03/26/2017   HGB 16.8 03/26/2017   HCT 47.6 03/26/2017   PLT 251 03/26/2017   GLUCOSE 102 (H) 03/26/2017   CHOL 223 (  H) 01/16/2017   TRIG 268.0 (H) 01/16/2017   HDL 40.20 01/16/2017   LDLDIRECT 150.0 01/16/2017   LDLCALC 166 (H) 03/30/2016   ALT 21 01/16/2017   AST 19 01/16/2017   NA 135 03/26/2017   K 3.9 03/26/2017   CL 104 03/26/2017   CREATININE 1.41 (H) 03/26/2017   BUN 23 (H) 03/26/2017   CO2 21 (L) 03/26/2017   TSH 2.33 01/16/2017   PSA 0.71 03/30/2016   HGBA1C 5.9 03/30/2016       Assessment & Plan:

## 2017-04-02 NOTE — Assessment & Plan Note (Signed)
Mild, tx for infection/uti and has been drinking more fliuds, for f/u lab today

## 2017-04-03 ENCOUNTER — Telehealth: Payer: Self-pay

## 2017-04-03 NOTE — Telephone Encounter (Signed)
Patient was informed and expressed understanding.  

## 2017-04-03 NOTE — Telephone Encounter (Signed)
-----   Message from [name], MD sent at 04/02/2017  7:08 PM EDT ----- Left message on MyChart, pt to cont same tx except  The test results show that your current treatment is OK, except the PSA is elevated.  This may not be from prostate cancer, however, and can also be from some infection of the prostate.  Please take an antibiotic for 4 weeks, and return for a repeat PSA after it is done.  If the PSA is still elevated, you may need urology referral..    Eun Vermeer to please inform pt, I will do rx and PSA ordered

## 2017-04-23 ENCOUNTER — Encounter: Payer: Self-pay | Admitting: Internal Medicine

## 2017-04-23 DIAGNOSIS — R300 Dysuria: Secondary | ICD-10-CM

## 2017-04-24 ENCOUNTER — Encounter: Payer: Self-pay | Admitting: Internal Medicine

## 2017-04-24 ENCOUNTER — Other Ambulatory Visit (INDEPENDENT_AMBULATORY_CARE_PROVIDER_SITE_OTHER): Payer: Medicare Other

## 2017-04-24 DIAGNOSIS — R300 Dysuria: Secondary | ICD-10-CM | POA: Diagnosis not present

## 2017-04-24 LAB — URINALYSIS, ROUTINE W REFLEX MICROSCOPIC
Bilirubin Urine: NEGATIVE
Ketones, ur: NEGATIVE
Nitrite: NEGATIVE
RBC / HPF: NONE SEEN (ref 0–?)
Specific Gravity, Urine: 1.01 (ref 1.000–1.030)
Urine Glucose: NEGATIVE
Urobilinogen, UA: 0.2 (ref 0.0–1.0)
pH: 6 (ref 5.0–8.0)

## 2017-04-25 ENCOUNTER — Encounter: Payer: Self-pay | Admitting: Internal Medicine

## 2017-04-25 NOTE — Telephone Encounter (Signed)
shirron to fax labs to Dr Evans/urology [facility]  The UA and Urine culture results to date  We will likely need to fax the final results of the urine culture, maybe even later today

## 2017-04-26 ENCOUNTER — Telehealth: Payer: Self-pay

## 2017-04-26 ENCOUNTER — Encounter: Payer: Self-pay | Admitting: Internal Medicine

## 2017-04-26 LAB — URINE CULTURE

## 2017-04-26 MED ORDER — CIPROFLOXACIN HCL 500 MG PO TABS: 500.0000 mg | ORAL_TABLET | Freq: Two times a day (BID) | ORAL | 0 refills | Status: AC

## 2017-04-26 NOTE — Telephone Encounter (Signed)
Results were faxed to [name] [phone] at [facility]. Patient was informed of results and expressed understanding that we ordered these labs as a courtesy.

## 2017-04-26 NOTE — Telephone Encounter (Signed)
-----   Message from [name], MD sent at 04/26/2017 12:56 PM EDT ----- Left message on MyChart, pt to cont same tx except  The test results show that your current treatment is OK, except there is documented infection consistent with urinary tract infection.  These results are to be faxed to Urology at [facility] as you requested. This test was done as a courtesy to you, as we do not normally perform lab testing for non [facility] physicians.  I assume treatment would come per Urology,  Let me know if this is not the case.    [name] to please inform pt, needs results faxed to Urology at [facility], and if needs antibx I can do this

## 2017-05-01 DIAGNOSIS — N410 Acute prostatitis: Secondary | ICD-10-CM | POA: Diagnosis not present

## 2017-05-08 DIAGNOSIS — R3914 Feeling of incomplete bladder emptying: Secondary | ICD-10-CM | POA: Diagnosis not present

## 2017-05-08 DIAGNOSIS — R339 Retention of urine, unspecified: Secondary | ICD-10-CM | POA: Diagnosis not present

## 2017-05-10 DIAGNOSIS — Z87891 Personal history of nicotine dependence: Secondary | ICD-10-CM | POA: Diagnosis not present

## 2017-05-10 DIAGNOSIS — I2510 Atherosclerotic heart disease of native coronary artery without angina pectoris: Secondary | ICD-10-CM | POA: Diagnosis not present

## 2017-05-10 DIAGNOSIS — N3000 Acute cystitis without hematuria: Secondary | ICD-10-CM | POA: Diagnosis not present

## 2017-05-10 DIAGNOSIS — N2889 Other specified disorders of kidney and ureter: Secondary | ICD-10-CM | POA: Diagnosis not present

## 2017-05-10 DIAGNOSIS — Z887 Allergy status to serum and vaccine status: Secondary | ICD-10-CM | POA: Diagnosis not present

## 2017-05-10 DIAGNOSIS — N411 Chronic prostatitis: Secondary | ICD-10-CM | POA: Diagnosis not present

## 2017-05-10 DIAGNOSIS — Z888 Allergy status to other drugs, medicaments and biological substances status: Secondary | ICD-10-CM | POA: Diagnosis not present

## 2017-05-10 DIAGNOSIS — Z885 Allergy status to narcotic agent status: Secondary | ICD-10-CM | POA: Diagnosis not present

## 2017-05-10 DIAGNOSIS — N3010 Interstitial cystitis (chronic) without hematuria: Secondary | ICD-10-CM | POA: Diagnosis not present

## 2017-05-23 ENCOUNTER — Ambulatory Visit (INDEPENDENT_AMBULATORY_CARE_PROVIDER_SITE_OTHER)
Admission: RE | Admit: 2017-05-23 | Discharge: 2017-05-23 | Disposition: A | Discharge status: Home / Self Care | Payer: Medicare Other | Source: Ambulatory Visit | Attending: Internal Medicine | Admitting: Internal Medicine

## 2017-05-23 DIAGNOSIS — Z13820 Encounter for screening for osteoporosis: Secondary | ICD-10-CM

## 2017-05-23 DIAGNOSIS — Z8262 Family history of osteoporosis: Secondary | ICD-10-CM

## 2017-05-23 DIAGNOSIS — E2839 Other primary ovarian failure: Secondary | ICD-10-CM

## 2017-05-27 DIAGNOSIS — R339 Retention of urine, unspecified: Secondary | ICD-10-CM | POA: Diagnosis not present

## 2017-05-27 DIAGNOSIS — R3914 Feeling of incomplete bladder emptying: Secondary | ICD-10-CM | POA: Diagnosis not present

## 2017-06-18 NOTE — Progress Notes (Signed)
Pre visit review using our clinic review tool, if applicable. No additional management support is needed unless otherwise documented below in the visit note. 

## 2017-06-18 NOTE — Progress Notes (Addendum)
Subjective:   Erik Lolling Sr. is a 71 y.o. male who presents for Medicare Annual (Subsequent) preventive examination.  Review of Systems:  No ROS.  Medicare Wellness Visit. Additional risk factors are reflected in the social history.  Cardiac Risk Factors include: advanced age (>68men, >84 women);male gender;sedentary lifestyle Sleep patterns: has frequent nighttime awakenings, gets up 5-6 times nightly to void and sleeps 4-5 hours nightly. Patient reports insomnia issues, discussed recommended sleep tips and stress reduction tips, education was sent via [place]. Patient is followed by urology.  Home Safety/Smoke Alarms: Feels safe in home. Smoke alarms in place.  Living environment; residence and Firearm Safety: 1-story house/ trailer, equipment: Hydrologist, Type: Tub Surveyor, quantity, no firearms. Lives with wife, no needs for DME, good support system  Seat Belt Safety/Bike Helmet: Wears seat belt.   Counseling:   Eye Exam- appointment yearly Dental- appointment yearly   Male:   CCS-  Last 11/23/15, recall 10 years   PSA-  Lab Results  Component Value Date   PSA 4.61 (H) 04/02/2017   PSA 0.71 03/30/2016   PSA 1.12 06/30/2015       Objective:     Vitals: BP 126/78   Pulse 74   Resp 20   Ht 5\' 7"  (1.702 m)   Wt 168 lb (76.2 kg)   SpO2 98%   BMI 26.31 kg/m   Body mass index is 26.31 kg/m.   Tobacco History  Smoking Status  . Former Smoker  Smokeless Tobacco  . Never Used    Comment: quit 1980     Counseling given: Not Answered   Past Medical History:  Diagnosis Date  . Allergic rhinitis   . Allergy   . Anal fissure   . Anxiety   . CAD (coronary artery disease)   . Cataract    starting  . Coronary atherosclerosis of native coronary artery 2007   nonobstructive CAD by cath with 40% mid-LAD stenosis  . Depression   . Diverticulitis   . Diverticulosis 12/18/2011  . FIBROMYALGIA 06/09/2010   Qualifier: Diagnosis of  By: Carlean Purl MD, Dimas Millin  Fibromyalgia   . GERD with stricture   . Helicobacter pylori (H. pylori) infection 11/25/2012   11/2012 EGD + gastric bxs  . Hiatal hernia   . HLD (hyperlipidemia)   . HTN (hypertension)   . Hyperlipidemia   . IBS (irritable bowel syndrome)   . Impotence of organic origin   . Insomnia   . Internal and external hemorrhoids without complication   . Interstitial cystitis   . INTERSTITIAL CYSTITIS 06/09/2010   Qualifier: Diagnosis of  By: Carlean Purl MD, Dimas Millin Irritable bowel syndrome 06/09/2010   Qualifier: Diagnosis of  By: Carlean Purl MD, Dimas Millin   . Reactive hypoglycemia   . Rheumatoid arthritis(714.0)   . 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 bladder distention     x 6  . CATHETER REMOVAL     super pubic area  . COLONOSCOPY  05/10/2006   diverticulosis, internal and external hemorrhoids  . COLONOSCOPY    . INTERSTIM IMPLANT PLACEMENT    . INTERSTIM IMPLANT REMOVAL    . KNEE ARTHROSCOPY     right x 2  . NISSEN FUNDOPLICATION  1027  . SHOULDER ARTHROSCOPY  2010   left  . TONSILLECTOMY AND ADENOIDECTOMY  1957  . TRANSURETHRAL RESECTION OF PROSTATE     x 2  .  UPPER GASTROINTESTINAL ENDOSCOPY  05/13/2008   hiatal hernia  . VASECTOMY     Family History  Problem Relation Age of Onset  . Colon polyps Father   . Diabetes Father   . Heart disease Father   . Hypertension Father   . Heart disease Mother   . Hypertension Mother   . Stroke Mother   . Breast cancer Sister   . Diabetes Sister   . Diabetes Paternal Uncle   . Colon cancer Neg Hx   . Esophageal cancer Neg Hx   . Rectal cancer Neg Hx   . Stomach cancer Neg Hx    History  Sexual Activity  . Sexual activity: Not on file    Outpatient Encounter Prescriptions as of 06/19/2017  Medication Sig  . cephALEXin (KEFLEX) 500 MG capsule Take 1 capsule (500 mg total) by mouth 4 (four) times daily.  . cyclobenzaprine (FLEXERIL) 5 MG tablet Take 1 tablet (5 mg total)  by mouth 3 (three) times daily as needed for muscle spasms.  Marland Kitchen doxycycline (VIBRA-TABS) 100 MG tablet Take 1 tablet (100 mg total) by mouth 2 (two) times daily.  . hydrOXYzine (ATARAX/VISTARIL) 25 MG tablet Take 1 tablet (25 mg total) by mouth 3 (three) times daily as needed for anxiety.  Marland Kitchen LORazepam (ATIVAN) 0.5 MG tablet TAKE 1 TABLET BY MOUTH TWICE DAILY AS NEEDED FOR ANXIETY  . ranitidine (ZANTAC) 150 MG tablet Take 1 tablet (150 mg total) by mouth 2 (two) times daily.  . traMADol (ULTRAM) 50 MG tablet Take 1 tablet (50 mg total) by mouth every 6 (six) hours as needed. (Patient taking differently: Take 50 mg by mouth every 6 (six) hours as needed for moderate pain or severe pain. )   No facility-administered encounter medications on file as of 06/19/2017.     Activities of Daily Living In your present state of health, do you have any difficulty performing the following activities: 06/20/2017  Hearing? N  Vision? N  Difficulty concentrating or making decisions? N  Walking or climbing stairs? Y  Dressing or bathing? N  Doing errands, shopping? N  Preparing Food and eating ? N  Using the Toilet? N  In the past six months, have you accidently leaked urine? Y  Comment followed by urology  Do you have problems with loss of bowel control? N  Managing your Medications? N  Managing your Finances? N  Housekeeping or managing your Housekeeping? N  Some recent data might be hidden    Patient Care Team: [name], MD as PCP - General (Internal Medicine) [name], MD as Attending Physician (Internal Medicine) Domingo Pulse, MD (Urology)    Assessment:    Physical assessment deferred to PCP.  Exercise Activities and Dietary recommendations Current Exercise Habits: The patient does not participate in regular exercise at present, Exercise limited by: orthopedic condition(s)  Diet (meal preparation, eat out, water intake, caffeinated beverages, dairy products, fruits and  vegetables): in general, a "healthy" diet  , well balanced, eats a variety of fruits and vegetables daily, limits salt, fat/cholesterol, sugar, caffeine, drinks 6-8 glasses of water daily.  Goals    . Exercise 150 minutes per week (moderate activity)          In general to be more active; Getting his sleep now and having energy;  Has been in aquatics in the past and may try again.  Start date Sept 1     . Increase my physical activity  Continue to stretch and walk as much as possible.      Fall Risk Fall Risk  06/20/2017 04/02/2017 03/30/2016 02/02/2015 07/03/2013  Falls in the past year? No No No No No   Depression Screen PHQ 2/9 Scores 06/20/2017 04/02/2017 03/30/2016 02/02/2015  PHQ - 2 Score 2 0 0 0  PHQ- 9 Score 6 3 - -     Cognitive Function       Ad8 score reviewed for issues:  Issues making decisions: no  Less interest in hobbies / activities: no  Repeats questions, stories (family complaining): no  Trouble using ordinary gadgets (microwave, computer, phone):no  Forgets the month or year: no  Mismanaging finances: no  Remembering appts: no  Daily problems with thinking and/or memory: no Ad8 score is= 0    Immunization History  Administered Date(s) Administered  . Pneumococcal Polysaccharide-23 07/03/2013   Screening Tests Health Maintenance  Topic Date Due  . PNA vac Low Risk Adult (2 of 2 - PCV13) 07/03/2014  . INFLUENZA VACCINE  06/12/2017  . TETANUS/TDAP  11/12/2020  . COLONOSCOPY  11/22/2025  . Hepatitis C Screening  Completed      Plan:   I have personally reviewed and noted the following in the patient's chart:   . Medical and social history . Use of alcohol, tobacco or illicit drugs  . Current medications and supplements . Functional ability and status . Nutritional status . Physical activity . Advanced directives . List of other physicians . Vitals . Screenings to include cognitive, depression, and falls . Referrals and  appointments  In addition, I have reviewed and discussed with patient certain preventive protocols, quality metrics, and best practice recommendations. A written personalized care plan for preventive services as well as general preventive health recommendations were provided to patient.     Michiel Cowboy, RN  06/20/2017  Medical screening examination/treatment/procedure(s) were performed by non-physician practitioner and as supervising physician I was immediately available for consultation/collaboration. I agree with above. Cathlean Cower, MD

## 2017-06-19 ENCOUNTER — Ambulatory Visit (INDEPENDENT_AMBULATORY_CARE_PROVIDER_SITE_OTHER): Payer: Medicare Other | Admitting: *Deleted

## 2017-06-19 VITALS — BP 126/78 | HR 74 | Resp 20 | Ht 67.0 in | Wt 168.0 lb

## 2017-06-19 DIAGNOSIS — Z0000 Encounter for general adult medical examination without abnormal findings: Secondary | ICD-10-CM | POA: Diagnosis not present

## 2017-06-20 DIAGNOSIS — R3915 Urgency of urination: Secondary | ICD-10-CM | POA: Diagnosis not present

## 2017-06-20 DIAGNOSIS — R350 Frequency of micturition: Secondary | ICD-10-CM | POA: Diagnosis not present

## 2017-06-24 ENCOUNTER — Encounter: Payer: Self-pay | Admitting: Internal Medicine

## 2017-06-25 ENCOUNTER — Telehealth: Payer: Self-pay | Admitting: *Deleted

## 2017-06-25 NOTE — Telephone Encounter (Signed)
Called patient in response to his MyChart question concerning an appointment based upon discussion during AWV.  Patient has c/o rectal burning of which is not a new occurrence, he also has hand vessel ruptures that happens spontaneously which is not new, nurse advised during AWV that patient should follow-up with PCP but nurse was unable to make an appointment be cause the computer system was shut down during that visit time. The patient sent a MYChart message to inquire about appointment status. Patient has an established PCP appointment 10/09/17, he states that the symptoms have improved and he feels he can wait to follow-up with PCP during November visit. Nurse discussed this with PCP and PCP states that patient can wait until November visit. The patient was informed that if his symptoms worsened to make an appointment right away to see Dr. Jenny Reichmann. Patient verbalized understanding.

## 2017-07-02 ENCOUNTER — Other Ambulatory Visit: Payer: Self-pay | Admitting: Internal Medicine

## 2017-07-02 DIAGNOSIS — Z789 Other specified health status: Secondary | ICD-10-CM

## 2017-07-02 NOTE — Telephone Encounter (Signed)
Faxed

## 2017-07-02 NOTE — Telephone Encounter (Signed)
Done hardcopy to Shirron  

## 2017-07-08 ENCOUNTER — Encounter: Payer: Self-pay | Admitting: Internal Medicine

## 2017-07-10 ENCOUNTER — Ambulatory Visit (INDEPENDENT_AMBULATORY_CARE_PROVIDER_SITE_OTHER): Payer: Medicare Other | Admitting: Internal Medicine

## 2017-07-10 ENCOUNTER — Encounter: Payer: Self-pay | Admitting: Internal Medicine

## 2017-07-10 VITALS — BP 112/70 | HR 89 | Temp 98.3°F | Ht 67.0 in | Wt 170.0 lb

## 2017-07-10 DIAGNOSIS — F419 Anxiety disorder, unspecified: Secondary | ICD-10-CM

## 2017-07-10 DIAGNOSIS — F329 Major depressive disorder, single episode, unspecified: Secondary | ICD-10-CM | POA: Diagnosis not present

## 2017-07-10 DIAGNOSIS — G4700 Insomnia, unspecified: Secondary | ICD-10-CM

## 2017-07-10 DIAGNOSIS — F32A Depression, unspecified: Secondary | ICD-10-CM

## 2017-07-10 MED ORDER — LORAZEPAM 1 MG PO TABS: 1.0000 mg | ORAL_TABLET | Freq: Two times a day (BID) | ORAL | 2 refills | Status: DC | PRN

## 2017-07-10 MED ORDER — PAROXETINE HCL 10 MG PO TABS: 10.0000 mg | ORAL_TABLET | Freq: Every day | ORAL | 3 refills | Status: DC

## 2017-07-10 MED ORDER — ESZOPICLONE 2 MG PO TABS: 2.0000 mg | ORAL_TABLET | Freq: Every evening | ORAL | 2 refills | Status: DC | PRN

## 2017-07-10 NOTE — Patient Instructions (Signed)
Please take all new medication as prescribed - the paxil 10 mg, and the lunesta for sleep  OK to increase the ativan to 1 mg twice per day  Please continue your counseling as you have planned  Please continue all other medications as before, and refills have been done if requested.  Please have the pharmacy call with any other refills you may need.  Please keep your appointments with your specialists as you may have planned

## 2017-07-10 NOTE — Progress Notes (Signed)
Subjective:    Patient ID: Erik Lolling Sr., male    DOB: August 27, 1946, 71 y.o.   MRN: [id number]  HPI  Here to f/u, c/o persistent and mild worsening anxiety without worsening depression and denies SI or HI.  Just cant seem to get over constant stress and anxiety though he has no significant worsening stressors.  Has also worsening difficulty with sleep onset most nights for the past 3 wks, is very much asking for sleep aid. Pt denies chest pain, increased sob or doe, wheezing, orthopnea, PND, increased LE swelling, palpitations, dizziness or syncope.  Pt denies new neurological symptoms such as new headache, or facial or extremity weakness or numbness   Pt denies polydipsia, polyuria Past Medical History:  Diagnosis Date  . Allergic rhinitis   . Allergy   . Anal fissure   . Anxiety   . CAD (coronary artery disease)   . Cataract    starting  . Coronary atherosclerosis of native coronary artery 2007   nonobstructive CAD by cath with 40% mid-LAD stenosis  . Depression   . Diverticulitis   . Diverticulosis 12/18/2011  . FIBROMYALGIA 06/09/2010   Qualifier: Diagnosis of  By: Carlean Purl MD, Dimas Millin Fibromyalgia   . GERD with stricture   . Helicobacter pylori (H. pylori) infection 11/25/2012   11/2012 EGD + gastric bxs  . Hiatal hernia   . HLD (hyperlipidemia)   . HTN (hypertension)   . Hyperlipidemia   . IBS (irritable bowel syndrome)   . Impotence of organic origin   . Insomnia   . Internal and external hemorrhoids without complication   . Interstitial cystitis   . INTERSTITIAL CYSTITIS 06/09/2010   Qualifier: Diagnosis of  By: Carlean Purl MD, Dimas Millin Irritable bowel syndrome 06/09/2010   Qualifier: Diagnosis of  By: Carlean Purl MD, Dimas Millin   . Reactive hypoglycemia   . Rheumatoid arthritis(714.0)   . 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 bladder distention     x 6  . CATHETER REMOVAL     super pubic area  .  COLONOSCOPY  05/10/2006   diverticulosis, internal and external hemorrhoids  . COLONOSCOPY    . INTERSTIM IMPLANT PLACEMENT    . INTERSTIM IMPLANT REMOVAL    . KNEE ARTHROSCOPY     right x 2  . NISSEN FUNDOPLICATION  6761  . SHOULDER ARTHROSCOPY  2010   left  . TONSILLECTOMY AND ADENOIDECTOMY  1957  . TRANSURETHRAL RESECTION OF PROSTATE     x 2  . UPPER GASTROINTESTINAL ENDOSCOPY  05/13/2008   hiatal hernia  . VASECTOMY      reports that he has quit smoking. He has never used smokeless tobacco. He reports that he does not drink alcohol or use drugs. family history includes Breast cancer in his sister; Colon polyps in his father; Diabetes in his father, paternal uncle, and sister; Heart disease in his father and mother; Hypertension in his father and mother; Stroke in his mother. Allergies  Allergen Reactions  . Ambien [Zolpidem]   . Lexapro [Escitalopram Oxalate]   . Pneumovax [Pneumococcal Polysaccharide Vaccine] Other (See Comments)    pain  . Prilosec [Omeprazole] Other (See Comments)    Per patient joint pain with PPI's  . Remeron [Mirtazapine]   . Statins Other (See Comments)    Joint pain  . Tramadol Other (See Comments)   Current Outpatient Prescriptions on File Prior to  Visit  Medication Sig Dispense Refill  . cyclobenzaprine (FLEXERIL) 5 MG tablet Take 1 tablet (5 mg total) by mouth 3 (three) times daily as needed for muscle spasms. 90 tablet 3  . hydrOXYzine (ATARAX/VISTARIL) 25 MG tablet Take 1 tablet (25 mg total) by mouth 3 (three) times daily as needed for anxiety. 270 tablet 3  . ranitidine (ZANTAC) 150 MG tablet Take 1 tablet (150 mg total) by mouth 2 (two) times daily. 60 tablet 0  . traMADol (ULTRAM) 50 MG tablet Take 1 tablet (50 mg total) by mouth every 6 (six) hours as needed. (Patient taking differently: Take 50 mg by mouth every 6 (six) hours as needed for moderate pain or severe pain. ) 120 tablet 2   No current facility-administered medications on file  prior to visit.    Review of Systems  Constitutional: Negative for other unusual diaphoresis or sweats HENT: Negative for ear discharge or swelling Eyes: Negative for other worsening visual disturbances Respiratory: Negative for stridor or other swelling  Gastrointestinal: Negative for worsening distension or other blood Genitourinary: Negative for retention or other urinary change Musculoskeletal: Negative for other MSK pain or swelling Skin: Negative for color change or other new lesions Neurological: Negative for worsening tremors and other numbness  Psychiatric/Behavioral: Negative for worsening agitation or other fatigue All other system neg per pt    Objective:   Physical Exam BP 112/70   Pulse 89   Temp 98.3 F (36.8 C) (Oral)   Ht 5\' 7"  (1.702 m)   Wt 170 lb (77.1 kg)   SpO2 99%   BMI 26.63 kg/m  VS noted,  Constitutional: Pt appears in NAD HENT: Head: NCAT.  Right Ear: External ear normal.  Left Ear: External ear normal.  Eyes: . Pupils are equal, round, and reactive to light. Conjunctivae and EOM are normal Nose: without d/c or deformity Neck: Neck supple. Gross normal ROM Cardiovascular: Normal rate and regular rhythm.   Pulmonary/Chest: Effort normal and breath sounds without rales or wheezing.  Neurological: Pt is alert. At baseline orientation, motor grossly intact Skin: Skin is warm. No rashes, other new lesions, no LE edema Psychiatric: Pt behavior is normal without agitation , 2+ depressed nervous mood and affect No other exam findings    Assessment & Plan:

## 2017-07-11 ENCOUNTER — Ambulatory Visit (INDEPENDENT_AMBULATORY_CARE_PROVIDER_SITE_OTHER): Payer: 59 | Admitting: Psychiatry

## 2017-07-11 DIAGNOSIS — F324 Major depressive disorder, single episode, in partial remission: Secondary | ICD-10-CM

## 2017-07-13 NOTE — Assessment & Plan Note (Signed)
Mod to severe chronic, for paxil 10qd , ok for increased ativan but try to wean at some point

## 2017-07-13 NOTE — Assessment & Plan Note (Signed)
At least moderate worsening, for lunesta qhs prn,  to f/u any worsening symptoms or concerns

## 2017-07-13 NOTE — Assessment & Plan Note (Signed)
Denies Si or HI, o/w for paxil as above, declines psychiatry or counseling referral

## 2017-07-18 DIAGNOSIS — J301 Allergic rhinitis due to pollen: Secondary | ICD-10-CM | POA: Diagnosis not present

## 2017-07-23 ENCOUNTER — Encounter: Payer: Self-pay | Admitting: Internal Medicine

## 2017-07-24 ENCOUNTER — Other Ambulatory Visit: Payer: Self-pay | Admitting: Internal Medicine

## 2017-07-24 MED ORDER — ZALEPLON 10 MG PO CAPS: 10.0000 mg | ORAL_CAPSULE | Freq: Every evening | ORAL | 1 refills | Status: DC | PRN

## 2017-07-24 NOTE — Telephone Encounter (Signed)
Done hardcopy to Shirron  

## 2017-07-25 NOTE — Telephone Encounter (Signed)
Faxed

## 2017-07-30 DIAGNOSIS — K580 Irritable bowel syndrome with diarrhea: Secondary | ICD-10-CM | POA: Diagnosis not present

## 2017-07-30 DIAGNOSIS — M6289 Other specified disorders of muscle: Secondary | ICD-10-CM | POA: Diagnosis not present

## 2017-07-30 DIAGNOSIS — N3010 Interstitial cystitis (chronic) without hematuria: Secondary | ICD-10-CM | POA: Diagnosis not present

## 2017-08-08 DIAGNOSIS — J069 Acute upper respiratory infection, unspecified: Secondary | ICD-10-CM | POA: Diagnosis not present

## 2017-08-08 DIAGNOSIS — K219 Gastro-esophageal reflux disease without esophagitis: Secondary | ICD-10-CM | POA: Diagnosis not present

## 2017-08-16 ENCOUNTER — Ambulatory Visit (INDEPENDENT_AMBULATORY_CARE_PROVIDER_SITE_OTHER): Payer: Medicare Other | Admitting: Family Medicine

## 2017-08-16 ENCOUNTER — Encounter: Payer: Self-pay | Admitting: Family Medicine

## 2017-08-16 VITALS — BP 124/76 | HR 88 | Temp 97.9°F | Ht 67.0 in | Wt 170.0 lb

## 2017-08-16 DIAGNOSIS — J069 Acute upper respiratory infection, unspecified: Secondary | ICD-10-CM

## 2017-08-16 MED ORDER — AZITHROMYCIN 250 MG PO TABS: ORAL_TABLET | ORAL | 0 refills | Status: DC

## 2017-08-16 NOTE — Progress Notes (Signed)
Erik Lolling Sr. - 71 y.o. male MRN [id number]  Date of birth: Jul 09, 1946  SUBJECTIVE:  Including CC & ROS.  Chief Complaint  Patient presents with  . Nasal Congestion    Patient is here today C/O congestion with drainage and fever for 11 days.  He has been using mucus relief OTC.    [name] is a 71 year old male is presenting with sinus congestion for about 2 weeks. He reports his symptoms have been intermittent. He has tried Mucinex. He has had some cough. He denies any significant fevers or to his pain. He was not had any sick contacts. Denies any travel. Denies any rashes.     Review of Systems  Constitutional: Negative for fever.  HENT: Positive for sinus pressure.   Respiratory: Negative for shortness of breath.     HISTORY: Past Medical, Surgical, Social, and Family History Reviewed & Updated per EMR.   Pertinent Historical Findings include:  Past Medical History:  Diagnosis Date  . Allergic rhinitis   . Allergy   . Anal fissure   . Anxiety   . CAD (coronary artery disease)   . Cataract    starting  . Coronary atherosclerosis of native coronary artery 2007   nonobstructive CAD by cath with 40% mid-LAD stenosis  . Depression   . Diverticulitis   . Diverticulosis 12/18/2011  . FIBROMYALGIA 06/09/2010   Qualifier: Diagnosis of  By: Carlean Purl MD, Dimas Millin Fibromyalgia   . GERD with stricture   . Helicobacter pylori (H. pylori) infection 11/25/2012   11/2012 EGD + gastric bxs  . Hiatal hernia   . HLD (hyperlipidemia)   . HTN (hypertension)   . Hyperlipidemia   . IBS (irritable bowel syndrome)   . Impotence of organic origin   . Insomnia   . Internal and external hemorrhoids without complication   . Interstitial cystitis   . INTERSTITIAL CYSTITIS 06/09/2010   Qualifier: Diagnosis of  By: Carlean Purl MD, Dimas Millin Irritable bowel syndrome 06/09/2010   Qualifier: Diagnosis of  By: Carlean Purl MD, Dimas Millin   . Reactive hypoglycemia   . Rheumatoid  arthritis(714.0)   . 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 bladder distention     x 6  . CATHETER REMOVAL     super pubic area  . COLONOSCOPY  05/10/2006   diverticulosis, internal and external hemorrhoids  . COLONOSCOPY    . INTERSTIM IMPLANT PLACEMENT    . INTERSTIM IMPLANT REMOVAL    . KNEE ARTHROSCOPY     right x 2  . NISSEN FUNDOPLICATION  7517  . SHOULDER ARTHROSCOPY  2010   left  . TONSILLECTOMY AND ADENOIDECTOMY  1957  . TRANSURETHRAL RESECTION OF PROSTATE     x 2  . UPPER GASTROINTESTINAL ENDOSCOPY  05/13/2008   hiatal hernia  . VASECTOMY      Allergies  Allergen Reactions  . Ambien [Zolpidem]   . Lexapro [Escitalopram Oxalate]   . Pneumovax [Pneumococcal Polysaccharide Vaccine] Other (See Comments)    pain  . Prilosec [Omeprazole] Other (See Comments)    Per patient joint pain with PPI's  . Remeron [Mirtazapine]   . Statins Other (See Comments)    Joint pain  . Tramadol Other (See Comments)    Family History  Problem Relation Age of Onset  . Colon polyps Father   . Diabetes Father   . Heart disease Father   .  Hypertension Father   . Heart disease Mother   . Hypertension Mother   . Stroke Mother   . Breast cancer Sister   . Diabetes Sister   . Diabetes Paternal Uncle   . Colon cancer Neg Hx   . Esophageal cancer Neg Hx   . Rectal cancer Neg Hx   . Stomach cancer Neg Hx      Social History   Social History  . Marital status: Married    Spouse name: N/A  . Number of children: 4  . Years of education: N/A   Occupational History  . retired    Social History Main Topics  . Smoking status: Former Research scientist (life sciences)  . Smokeless tobacco: Never Used     Comment: quit 1980  . Alcohol use No  . Drug use: No  . Sexual activity: Not on file   Other Topics Concern  . Not on file   Social History Narrative   ** Merged History Encounter **         PHYSICAL EXAM:  VS: BP 124/76 (BP Location: Right  Arm, Patient Position: Sitting, Cuff Size: Normal)   Pulse 88   Temp 97.9 F (36.6 C) (Oral)   Ht 5\' 7"  (1.702 m)   Wt 170 lb 0.6 oz (77.1 kg)   SpO2 96%   BMI 26.63 kg/m  Physical Exam Gen: NAD, alert, cooperative with exam, well-appearing ENT: normal lips, normal nasal mucosa, tympanic membranes clear and intact bilaterally, normal oropharynx, no frontal or sinus tenderness to palpation, no cervical lymphadenopathy, no tonsillar exudates, Eye: normal EOM, normal conjunctiva and lids CV:  no edema, +2 pedal pulses, S1-S2, regular rate and rhythm   Resp: no accessory muscle use, non-labored, clear to auscultation bilaterally, no crackles or wheezes Skin: no rashes, no areas of induration  Neuro: normal tone, normal sensation to touch Psych:  normal insight, alert and oriented MSK: Normal gait, normal strength      ASSESSMENT & PLAN:   Upper respiratory tract infection Findings are suggestive of viral in nature. Possible for bacterial due to duration with worsening as of late. - Counseled and advised supportive care - Provided a printed prescription of azithromycin that he can take if no improvement.

## 2017-08-16 NOTE — Patient Instructions (Signed)
Thank you for coming in,   Please try things such as zyrtec-D or allegra-D which is an antihistamine and decongestant.   Please try afrin which will help with nasal congestion but use for only three days.   Please also try using a netti pot on a regular occasion.  Honey can help with a sore throat.      Please feel free to call with any questions or concerns at any time, at [phone]. --Dr. Fahad Cisse  

## 2017-08-17 DIAGNOSIS — J069 Acute upper respiratory infection, unspecified: Secondary | ICD-10-CM | POA: Insufficient documentation

## 2017-08-17 NOTE — Assessment & Plan Note (Signed)
Findings are suggestive of viral in nature. Possible for bacterial due to duration with worsening as of late. - Counseled and advised supportive care - Provided a printed prescription of azithromycin that he can take if no improvement.

## 2017-08-19 ENCOUNTER — Ambulatory Visit: Payer: Self-pay | Admitting: Internal Medicine

## 2017-08-20 DIAGNOSIS — R3915 Urgency of urination: Secondary | ICD-10-CM | POA: Diagnosis not present

## 2017-08-20 DIAGNOSIS — R350 Frequency of micturition: Secondary | ICD-10-CM | POA: Diagnosis not present

## 2017-10-09 ENCOUNTER — Encounter: Payer: Self-pay | Admitting: Internal Medicine

## 2017-10-09 ENCOUNTER — Ambulatory Visit (INDEPENDENT_AMBULATORY_CARE_PROVIDER_SITE_OTHER): Payer: Medicare Other | Admitting: Internal Medicine

## 2017-10-09 VITALS — BP 106/72 | HR 88 | Temp 98.3°F | Ht 67.0 in | Wt 171.0 lb

## 2017-10-09 DIAGNOSIS — M170 Bilateral primary osteoarthritis of knee: Secondary | ICD-10-CM | POA: Insufficient documentation

## 2017-10-09 DIAGNOSIS — E785 Hyperlipidemia, unspecified: Secondary | ICD-10-CM | POA: Diagnosis not present

## 2017-10-09 DIAGNOSIS — R739 Hyperglycemia, unspecified: Secondary | ICD-10-CM | POA: Diagnosis not present

## 2017-10-09 DIAGNOSIS — Z0000 Encounter for general adult medical examination without abnormal findings: Secondary | ICD-10-CM

## 2017-10-09 DIAGNOSIS — F419 Anxiety disorder, unspecified: Secondary | ICD-10-CM

## 2017-10-09 DIAGNOSIS — M069 Rheumatoid arthritis, unspecified: Secondary | ICD-10-CM | POA: Diagnosis not present

## 2017-10-09 DIAGNOSIS — E161 Other hypoglycemia: Secondary | ICD-10-CM | POA: Insufficient documentation

## 2017-10-09 DIAGNOSIS — J309 Allergic rhinitis, unspecified: Secondary | ICD-10-CM | POA: Diagnosis not present

## 2017-10-09 DIAGNOSIS — R972 Elevated prostate specific antigen [PSA]: Secondary | ICD-10-CM | POA: Insufficient documentation

## 2017-10-09 HISTORY — DX: Bilateral primary osteoarthritis of knee: M17.0

## 2017-10-09 MED ORDER — AZELASTINE-FLUTICASONE 137-50 MCG/ACT NA SUSP: NASAL | 5 refills | Status: DC

## 2017-10-09 NOTE — Patient Instructions (Signed)
Ok to take the [employer] , as well as the new Dymista for allergies  Please continue all other medications as before, and refills have been done if requested.  Please have the pharmacy call with any other refills you may need.  Please continue your efforts at being more active, low cholesterol diet, and weight control.  Please keep your appointments with your specialists as you may have planned  Please return in 6 months, or sooner if needed, with Lab testing done 3-5 days before

## 2017-10-09 NOTE — Progress Notes (Signed)
Subjective:    Patient ID: Erik Lolling Sr., male    DOB: [birth year], 71 y.o.   MRN: [id number]  HPI  Here to f/u; overall doing ok,  Pt denies chest pain, increasing sob or doe, wheezing, orthopnea, PND, increased LE swelling, palpitations, dizziness or syncope.  Pt denies new neurological symptoms such as new headache, or facial or extremity weakness or numbness.  Pt denies polydipsia, polyuria, or low sugar episode.  Pt states overall good compliance with meds, mostly trying to follow appropriate diet, with wt overall stable.  Does have several wks ongoing nasal allergy symptoms with clearish congestion, itch and sneezing, without fever, pain, ST, cough, swelling or wheezing.  Has recurring mild joint pain, no planned f/u with rheum. Also s/p 2 months antibx assoc with last PSA elevation, due for f/u psa now after seen per [facility] urology. Denies urinary symptoms such as dysuria, frequency, urgency, flank pain, hematuria or n/v, fever, chills.  Also with constant throat clearing, saw ENT - ? Reflux, is s/p nissen fundoplication that GI saw intact by egd jan 2017. Past Medical History:  Diagnosis Date  . Allergic rhinitis   . Allergy   . Anal fissure   . Anxiety   . CAD (coronary artery disease)   . Cataract    starting  . Coronary atherosclerosis of native coronary artery 2007   nonobstructive CAD by cath with 40% mid-LAD stenosis  . Degenerative arthritis of knee, bilateral 10/09/2017  . Depression   . Diverticulitis   . Diverticulosis 12/18/2011  . FIBROMYALGIA 06/09/2010   Qualifier: Diagnosis of  By: Carlean Purl MD, Dimas Millin Fibromyalgia   . GERD with stricture   . Helicobacter pylori (H. pylori) infection 11/25/2012   11/2012 EGD + gastric bxs  . Hiatal hernia   . HLD (hyperlipidemia)   . HTN (hypertension)   . Hyperlipidemia   . IBS (irritable bowel syndrome)   . Impotence of organic origin   . Insomnia   . Internal and external hemorrhoids without complication   .  Interstitial cystitis   . INTERSTITIAL CYSTITIS 06/09/2010   Qualifier: Diagnosis of  By: Carlean Purl MD, Dimas Millin Irritable bowel syndrome 06/09/2010   Qualifier: Diagnosis of  By: Carlean Purl MD, Dimas Millin   . Reactive hypoglycemia   . Rheumatoid arthritis(714.0)   . 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 bladder distention     x 6  . CATHETER REMOVAL     super pubic area  . COLONOSCOPY  05/10/2006   diverticulosis, internal and external hemorrhoids  . COLONOSCOPY    . INTERSTIM IMPLANT PLACEMENT    . INTERSTIM IMPLANT REMOVAL    . KNEE ARTHROSCOPY     right x 2  . NISSEN FUNDOPLICATION  9381  . SHOULDER ARTHROSCOPY  2010   left  . TONSILLECTOMY AND ADENOIDECTOMY  1957  . TRANSURETHRAL RESECTION OF PROSTATE     x 2  . UPPER GASTROINTESTINAL ENDOSCOPY  05/13/2008   hiatal hernia  . VASECTOMY      reports that he has quit smoking. he has never used smokeless tobacco. He reports that he does not drink alcohol or use drugs. family history includes Breast cancer in his sister; Colon polyps in his father; Diabetes in his father, paternal uncle, and sister; Heart disease in his father and mother; Hypertension in his father and mother; Stroke in his mother. Allergies  Allergen Reactions  .  Ambien [Zolpidem]   . Lexapro [Escitalopram Oxalate]   . Pneumovax [Pneumococcal Polysaccharide Vaccine] Other (See Comments)    pain  . Prilosec [Omeprazole] Other (See Comments)    Per patient joint pain with PPI's  . Remeron [Mirtazapine]   . Statins Other (See Comments)    Joint pain  . Tramadol Other (See Comments)   Current Outpatient Medications on File Prior to Visit  Medication Sig Dispense Refill  . Bioflavonoid Products (ESTER C PO) Take 1 capsule by mouth daily.    . bupivacaine (MARCAINE) 0.5 % SOLN injection Instill 15 cc into bladder daily    . heparin 10000 UNIT/ML injection INSTILL 4ML INTO BLADDER DAILY  11  . hydrOXYzine  (ATARAX/VISTARIL) 25 MG tablet Take 1 tablet (25 mg total) by mouth 3 (three) times daily as needed for anxiety. 270 tablet 3  . LORazepam (ATIVAN) 1 MG tablet Take 1 tablet (1 mg total) by mouth 2 (two) times daily as needed for anxiety. 60 tablet 2  . ranitidine (ZANTAC) 150 MG tablet Take 1 tablet (150 mg total) by mouth 2 (two) times daily. 60 tablet 0  . traMADol (ULTRAM) 50 MG tablet Take 1 tablet (50 mg total) by mouth every 6 (six) hours as needed. (Patient taking differently: Take 50 mg by mouth every 6 (six) hours as needed for moderate pain or severe pain. ) 120 tablet 2   No current facility-administered medications on file prior to visit.    Review of Systems  Constitutional: Negative for other unusual diaphoresis or sweats HENT: Negative for ear discharge or swelling Eyes: Negative for other worsening visual disturbances Respiratory: Negative for stridor or other swelling  Gastrointestinal: Negative for worsening distension or other blood Genitourinary: Negative for retention or other urinary change Musculoskeletal: Negative for other MSK pain or swelling Skin: Negative for color change or other new lesions Neurological: Negative for worsening tremors and other numbness  Psychiatric/Behavioral: Negative for worsening agitation or other fatigue All other system neg per pt    Objective:   Physical Exam BP 106/72   Pulse 88   Temp 98.3 F (36.8 C) (Oral)   Ht 5\' 7"  (1.702 m)   Wt 171 lb (77.6 kg)   SpO2 96%   BMI 26.78 kg/m  VS noted,  Constitutional: Pt appears in NAD HENT: Head: NCAT.  Right Ear: External ear normal.  Left Ear: External ear normal.  Eyes: . Pupils are equal, round, and reactive to light. Conjunctivae and EOM are normal Nose: without d/c or deformity Neck: Neck supple. Gross normal ROM Cardiovascular: Normal rate and regular rhythm.   Pulmonary/Chest: Effort normal and breath sounds without rales or wheezing.  Abd:  Soft, NT, ND, + BS, no  organomegaly Neurological: Pt is alert. At baseline orientation, motor grossly intact No joint effusions Skin: Skin is warm. No rashes, other new lesions, no LE edema Psychiatric: Pt behavior is normal without agitation  No other exam findings     Assessment & Plan:

## 2017-10-10 ENCOUNTER — Other Ambulatory Visit: Payer: Self-pay

## 2017-10-10 MED ORDER — CYCLOBENZAPRINE HCL 5 MG PO TABS
5.0000 mg | ORAL_TABLET | Freq: Three times a day (TID) | ORAL | 1 refills | Status: DC | PRN
Start: 2017-10-10 — End: 2017-11-29

## 2017-10-12 NOTE — Assessment & Plan Note (Signed)
Uncontrolled, declines statin, o/w stable overall by history and exam, recent data reviewed with pt, and pt to continue medical treatment as before,  to f/u any worsening symptoms or concerns

## 2017-10-12 NOTE — Assessment & Plan Note (Signed)
Also for rheum referral,  to f/u any worsening symptoms or concerns

## 2017-10-12 NOTE — Assessment & Plan Note (Signed)
Chronic with poor insight, declines change in tx or referral

## 2017-10-12 NOTE — Assessment & Plan Note (Signed)
Mild to mod, for allegra prn, dymista asd,,  to f/u any worsening symptoms or concerns

## 2017-10-12 NOTE — Assessment & Plan Note (Signed)
stable overall by history and exam, recent data reviewed with pt, and pt to continue medical treatment as before,  to f/u any worsening symptoms or concerns Lab Results  Component Value Date   HGBA1C 6.1 04/02/2017

## 2017-10-29 DIAGNOSIS — N3010 Interstitial cystitis (chronic) without hematuria: Secondary | ICD-10-CM | POA: Diagnosis not present

## 2017-10-29 DIAGNOSIS — N401 Enlarged prostate with lower urinary tract symptoms: Secondary | ICD-10-CM | POA: Diagnosis not present

## 2017-10-29 DIAGNOSIS — N411 Chronic prostatitis: Secondary | ICD-10-CM | POA: Diagnosis not present

## 2017-10-29 DIAGNOSIS — R350 Frequency of micturition: Secondary | ICD-10-CM | POA: Diagnosis not present

## 2017-10-29 DIAGNOSIS — M26609 Unspecified temporomandibular joint disorder, unspecified side: Secondary | ICD-10-CM | POA: Insufficient documentation

## 2017-10-29 DIAGNOSIS — R3915 Urgency of urination: Secondary | ICD-10-CM | POA: Diagnosis not present

## 2017-10-29 DIAGNOSIS — I2510 Atherosclerotic heart disease of native coronary artery without angina pectoris: Secondary | ICD-10-CM | POA: Insufficient documentation

## 2017-11-07 ENCOUNTER — Ambulatory Visit (INDEPENDENT_AMBULATORY_CARE_PROVIDER_SITE_OTHER): Payer: Medicare Other | Admitting: Psychiatry

## 2017-11-07 ENCOUNTER — Ambulatory Visit: Payer: 59 | Admitting: Psychiatry

## 2017-11-07 DIAGNOSIS — F324 Major depressive disorder, single episode, in partial remission: Secondary | ICD-10-CM

## 2017-11-13 DIAGNOSIS — M7542 Impingement syndrome of left shoulder: Secondary | ICD-10-CM | POA: Diagnosis not present

## 2017-11-13 DIAGNOSIS — M25561 Pain in right knee: Secondary | ICD-10-CM | POA: Diagnosis not present

## 2017-11-13 DIAGNOSIS — R3915 Urgency of urination: Secondary | ICD-10-CM | POA: Diagnosis not present

## 2017-11-13 DIAGNOSIS — M1711 Unilateral primary osteoarthritis, right knee: Secondary | ICD-10-CM | POA: Diagnosis not present

## 2017-11-13 DIAGNOSIS — R350 Frequency of micturition: Secondary | ICD-10-CM | POA: Diagnosis not present

## 2017-11-22 DIAGNOSIS — H40003 Preglaucoma, unspecified, bilateral: Secondary | ICD-10-CM | POA: Diagnosis not present

## 2017-11-22 DIAGNOSIS — H2510 Age-related nuclear cataract, unspecified eye: Secondary | ICD-10-CM | POA: Diagnosis not present

## 2017-11-29 ENCOUNTER — Ambulatory Visit (INDEPENDENT_AMBULATORY_CARE_PROVIDER_SITE_OTHER): Payer: Medicare Other | Admitting: Internal Medicine

## 2017-11-29 ENCOUNTER — Encounter: Payer: Self-pay | Admitting: Internal Medicine

## 2017-11-29 ENCOUNTER — Other Ambulatory Visit (INDEPENDENT_AMBULATORY_CARE_PROVIDER_SITE_OTHER): Payer: Medicare Other

## 2017-11-29 VITALS — BP 124/82 | HR 100 | Temp 98.3°F | Ht 67.0 in | Wt 173.0 lb

## 2017-11-29 DIAGNOSIS — Z0000 Encounter for general adult medical examination without abnormal findings: Secondary | ICD-10-CM

## 2017-11-29 DIAGNOSIS — M6289 Other specified disorders of muscle: Secondary | ICD-10-CM

## 2017-11-29 DIAGNOSIS — G4700 Insomnia, unspecified: Secondary | ICD-10-CM | POA: Diagnosis not present

## 2017-11-29 DIAGNOSIS — R972 Elevated prostate specific antigen [PSA]: Secondary | ICD-10-CM | POA: Diagnosis not present

## 2017-11-29 DIAGNOSIS — E559 Vitamin D deficiency, unspecified: Secondary | ICD-10-CM

## 2017-11-29 DIAGNOSIS — R739 Hyperglycemia, unspecified: Secondary | ICD-10-CM

## 2017-11-29 DIAGNOSIS — E785 Hyperlipidemia, unspecified: Secondary | ICD-10-CM

## 2017-11-29 HISTORY — DX: Vitamin D deficiency, unspecified: E55.9

## 2017-11-29 HISTORY — DX: Other specified disorders of muscle: M62.89

## 2017-11-29 LAB — CBC WITH DIFFERENTIAL/PLATELET
Basophils Absolute: 0.1 10*3/uL (ref 0.0–0.1)
Basophils Relative: 1.3 % (ref 0.0–3.0)
Eosinophils Absolute: 0 10*3/uL (ref 0.0–0.7)
Eosinophils Relative: 0.4 % (ref 0.0–5.0)
HCT: 46 % (ref 39.0–52.0)
Hemoglobin: 15.7 g/dL (ref 13.0–17.0)
Lymphocytes Relative: 14.6 % (ref 12.0–46.0)
Lymphs Abs: 1.5 10*3/uL (ref 0.7–4.0)
MCHC: 34 g/dL (ref 30.0–36.0)
MCV: 90.5 fl (ref 78.0–100.0)
Monocytes Absolute: 1 10*3/uL (ref 0.1–1.0)
Monocytes Relative: 10.1 % (ref 3.0–12.0)
Neutro Abs: 7.5 10*3/uL (ref 1.4–7.7)
Neutrophils Relative %: 73.6 % (ref 43.0–77.0)
Platelets: 257 10*3/uL (ref 150.0–400.0)
RBC: 5.08 Mil/uL (ref 4.22–5.81)
RDW: 12.6 % (ref 11.5–15.5)
WBC: 10.2 10*3/uL (ref 4.0–10.5)

## 2017-11-29 LAB — HEPATIC FUNCTION PANEL
ALT: 18 U/L (ref 0–53)
AST: 17 U/L (ref 0–37)
Albumin: 4.3 g/dL (ref 3.5–5.2)
Alkaline Phosphatase: 75 U/L (ref 39–117)
Bilirubin, Direct: 0.1 mg/dL (ref 0.0–0.3)
Total Bilirubin: 0.5 mg/dL (ref 0.2–1.2)
Total Protein: 6.8 g/dL (ref 6.0–8.3)

## 2017-11-29 LAB — URINALYSIS, ROUTINE W REFLEX MICROSCOPIC
Bilirubin Urine: NEGATIVE
Hgb urine dipstick: NEGATIVE
Ketones, ur: NEGATIVE
Leukocytes, UA: NEGATIVE
Nitrite: NEGATIVE
Specific Gravity, Urine: 1.015 (ref 1.000–1.030)
Total Protein, Urine: NEGATIVE
Urine Glucose: NEGATIVE
Urobilinogen, UA: 0.2 (ref 0.0–1.0)
pH: 6 (ref 5.0–8.0)

## 2017-11-29 LAB — LIPID PANEL
Cholesterol: 212 mg/dL — ABNORMAL HIGH (ref 0–200)
HDL: 38.6 mg/dL — ABNORMAL LOW (ref >39.00)
NonHDL: 173.42
Total CHOL/HDL Ratio: 5
Triglycerides: 383 mg/dL — ABNORMAL HIGH (ref 0.0–149.0)
VLDL: 76.6 mg/dL — ABNORMAL HIGH (ref 0.0–40.0)

## 2017-11-29 LAB — BASIC METABOLIC PANEL
BUN: 19 mg/dL (ref 6–23)
CO2: 29 mEq/L (ref 19–32)
Calcium: 9.3 mg/dL (ref 8.4–10.5)
Chloride: 100 mEq/L (ref 96–112)
Creatinine, Ser: 1.3 mg/dL (ref 0.40–1.50)
GFR: 57.89 mL/min — ABNORMAL LOW (ref >60.00)
Glucose, Bld: 115 mg/dL — ABNORMAL HIGH (ref 70–99)
Potassium: 4.3 mEq/L (ref 3.5–5.1)
Sodium: 136 mEq/L (ref 135–145)

## 2017-11-29 LAB — LDL CHOLESTEROL, DIRECT: Direct LDL: 157 mg/dL

## 2017-11-29 LAB — VITAMIN D 25 HYDROXY (VIT D DEFICIENCY, FRACTURES): VITD: 22.55 ng/mL — ABNORMAL LOW (ref 30.00–100.00)

## 2017-11-29 LAB — HEMOGLOBIN A1C: Hgb A1c MFr Bld: 5.9 % (ref 4.6–6.5)

## 2017-11-29 LAB — PSA: PSA: 0.97 ng/mL (ref 0.10–4.00)

## 2017-11-29 LAB — TSH: TSH: 2.33 u[IU]/mL (ref 0.35–4.50)

## 2017-11-29 MED ORDER — HYDROXYZINE HCL 25 MG PO TABS: 25.0000 mg | ORAL_TABLET | Freq: Three times a day (TID) | ORAL | 3 refills | Status: DC | PRN

## 2017-11-29 MED ORDER — CYCLOBENZAPRINE HCL 5 MG PO TABS: ORAL_TABLET | ORAL | 1 refills | Status: DC

## 2017-11-29 MED ORDER — LORAZEPAM 1 MG PO TABS: 0.5000 mg | ORAL_TABLET | Freq: Two times a day (BID) | ORAL | 2 refills | Status: DC | PRN

## 2017-11-29 NOTE — Patient Instructions (Signed)
Ok to change the flexeril to 10 mg at bedtime as needed  Please continue all other medications as before, and refills have been done if requested.  Please have the pharmacy call with any other refills you may need.  Please continue your efforts at being more active, low cholesterol diet, and weight control.  Please keep your appointments with your specialists as you may have planned  Please go to the LAB in the Basement (turn left off the elevator) for the tests to be done today  You will be contacted by phone if any changes need to be made immediately.  Otherwise, you will receive a letter about your results with an explanation, but please check with MyChart first.  Please remember to sign up for MyChart if you have not done so, as this will be important to you in the future with finding out test results, communicating by private email, and scheduling acute appointments online when needed.

## 2017-11-29 NOTE — Progress Notes (Signed)
Subjective:    Patient ID: Erik Lolling Sr., male    DOB: 1946-07-23, 72 y.o.   MRN: [id number]  HPI  Here to f/u primarily psychiatric concerns, "My medication is the biggest problem with getting stabilized, seems that I try most meds that will work for about 1 wk  but then does not seem to work after that."  In fact still with some difficulty getting to sleep last PM after combination of allegra, nyquil, flexeril, lorazepam, tramadol and atarax all at same time, though states he only has been taking half doses of tramadol, ativan and flexeril other than that prescribed.  Has tried remeron in past but with side effect, Has also tried TCA and seroquel in past without significant lasting improvement. Denies worsening depressive symptoms, suicidal ideation, or panic.  Pt denies chest pain, increased sob or doe, wheezing, orthopnea, PND, increased LE swelling, palpitations, dizziness or syncope.  Pt denies new neurological symptoms such as new headache, or facial or extremity weakness or numbness   Pt denies polydipsia, polyuria, Also, PFD somewhat better with PT. Past Medical History:  Diagnosis Date  . Allergic rhinitis   . Allergy   . Anal fissure   . Anxiety   . CAD (coronary artery disease)   . Cataract    starting  . Coronary atherosclerosis of native coronary artery 2007   nonobstructive CAD by cath with 40% mid-LAD stenosis  . Degenerative arthritis of knee, bilateral 10/09/2017  . Depression   . Diverticulitis   . Diverticulosis 12/18/2011  . FIBROMYALGIA 06/09/2010   Qualifier: Diagnosis of  By: Carlean Purl MD, Dimas Millin Fibromyalgia   . GERD with stricture   . Helicobacter pylori (H. pylori) infection 11/25/2012   11/2012 EGD + gastric bxs  . Hiatal hernia   . HLD (hyperlipidemia)   . HTN (hypertension)   . Hyperlipidemia   . IBS (irritable bowel syndrome)   . Impotence of organic origin   . Insomnia   . Internal and external hemorrhoids without complication   .  Interstitial cystitis   . INTERSTITIAL CYSTITIS 06/09/2010   Qualifier: Diagnosis of  By: Carlean Purl MD, Dimas Millin Irritable bowel syndrome 06/09/2010   Qualifier: Diagnosis of  By: Carlean Purl MD, Tonna Boehringer E   . Pelvic floor dysfunction 11/29/2017  . Reactive hypoglycemia   . Rheumatoid arthritis(714.0)   . 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 Vitamin D deficiency 11/29/2017   Past Surgical History:  Procedure Laterality Date  . bladder distention     x 6  . CATHETER REMOVAL     super pubic area  . COLONOSCOPY  05/10/2006   diverticulosis, internal and external hemorrhoids  . COLONOSCOPY    . INTERSTIM IMPLANT PLACEMENT    . INTERSTIM IMPLANT REMOVAL    . KNEE ARTHROSCOPY     right x 2  . NISSEN FUNDOPLICATION  7062  . SHOULDER ARTHROSCOPY  2010   left  . TONSILLECTOMY AND ADENOIDECTOMY  1957  . TRANSURETHRAL RESECTION OF PROSTATE     x 2  . UPPER GASTROINTESTINAL ENDOSCOPY  05/13/2008   hiatal hernia  . VASECTOMY      reports that he has quit smoking. he has never used smokeless tobacco. He reports that he does not drink alcohol or use drugs. family history includes Breast cancer in his sister; Colon polyps in his father; Diabetes in his father, paternal uncle, and sister; Heart disease in his father and mother; Hypertension in his  father and mother; Stroke in his mother. Allergies  Allergen Reactions  . Ambien [Zolpidem]   . Lexapro [Escitalopram Oxalate]   . Pneumovax [Pneumococcal Polysaccharide Vaccine] Other (See Comments)    pain  . Prilosec [Omeprazole] Other (See Comments)    Per patient joint pain with PPI's  . Remeron [Mirtazapine]   . Statins Other (See Comments)    Joint pain  . Tramadol Other (See Comments)   Current Outpatient Medications on File Prior to Visit  Medication Sig Dispense Refill  . Azelastine-Fluticasone 137-50 MCG/ACT SUSP 1 spray twice daily as needed 23 g 5  . Bioflavonoid Products (ESTER C PO) Take 1 capsule by mouth daily.    .  bupivacaine (MARCAINE) 0.5 % SOLN injection Instill 15 cc into bladder daily    . heparin 10000 UNIT/ML injection INSTILL 4ML INTO BLADDER DAILY  11  . ranitidine (ZANTAC) 150 MG tablet Take 1 tablet (150 mg total) by mouth 2 (two) times daily. 60 tablet 0  . traMADol (ULTRAM) 50 MG tablet Take 1 tablet (50 mg total) by mouth every 6 (six) hours as needed. (Patient taking differently: Take 50 mg by mouth every 6 (six) hours as needed for moderate pain or severe pain. ) 120 tablet 2   No current facility-administered medications on file prior to visit.    Review of Systems  Constitutional: Negative for other unusual diaphoresis or sweats HENT: Negative for ear discharge or swelling Eyes: Negative for other worsening visual disturbances Respiratory: Negative for stridor or other swelling  Gastrointestinal: Negative for worsening distension or other blood Genitourinary: Negative for retention or other urinary change Musculoskeletal: Negative for other MSK pain or swelling Skin: Negative for color change or other new lesions Neurological: Negative for worsening tremors and other numbness  Psychiatric/Behavioral: Negative for worsening agitation or other fatigue ALl other system neg per pt    Objective:   Physical Exam BP 124/82   Pulse 100   Temp 98.3 F (36.8 C) (Oral)   Ht 5\' 7"  (1.702 m)   Wt 173 lb (78.5 kg)   SpO2 99%   BMI 27.10 kg/m   VS noted, not ill appaering Constitutional: Pt appears in NAD HENT: Head: NCAT.  Right Ear: External ear normal.  Left Ear: External ear normal.  Eyes: . Pupils are equal, round, and reactive to light. Conjunctivae and EOM are normal Nose: without d/c or deformity Neck: Neck supple. Gross normal ROM Cardiovascular: Normal rate and regular rhythm.   Pulmonary/Chest: Effort normal and breath sounds without rales or wheezing.  Neurological: Pt is alert. At baseline orientation, motor grossly intact Skin: Skin is warm. No rashes, other new  lesions, no LE edema Psychiatric: Pt behavior is normal without agitation , + depressed nervous affect No other exam findings     Assessment & Plan:

## 2017-11-30 NOTE — Assessment & Plan Note (Signed)
For f/u vit d level, cont replacement

## 2017-11-30 NOTE — Assessment & Plan Note (Signed)
Improved symptoms, encouraged pt to continue with PT

## 2017-11-30 NOTE — Assessment & Plan Note (Signed)
Lab Results  Component Value Date   HGBA1C 5.9 11/29/2017  stable overall by history and exam, recent data reviewed with pt, and pt to continue medical treatment as before,  to f/u any worsening symptoms or concerns

## 2017-11-30 NOTE — Assessment & Plan Note (Signed)
Also for f/u psa, not clear that he took the recent antibx after last elevated psa

## 2017-11-30 NOTE — Assessment & Plan Note (Signed)
stable overall by history and exam, recent data reviewed with pt, and pt to continue medical treatment as before,  to f/u any worsening symptoms or concerns Lab Results  Component Value Date   LDLCALC 153 (H) 04/02/2017  reminded pt to cont low chol diet as he has been unable to tolerate statins

## 2017-11-30 NOTE — Assessment & Plan Note (Addendum)
His most pressing symptom at this time, I am concerned about risk of polypharmacy and will need to minimize controlled substance use, ok for increased flexeril 10 qhs prn,  to f/u any worsening symptoms or concerns  Note:  Total time for pt hx, exam, review of record with pt in the room, determination of diagnoses and plan for further eval and tx is > 40 min, with over 50% spent in coordination and counseling of patient including the differential dx, tx, further evaluation and other management of chronic insomnia, recent elev PSA and risk of prostate cancer, hyperglycemia, HLD, Vit D deficiency and PFD

## 2017-12-10 DIAGNOSIS — M25512 Pain in left shoulder: Secondary | ICD-10-CM | POA: Diagnosis not present

## 2017-12-10 DIAGNOSIS — M5185 Other intervertebral disc disorders, thoracolumbar region: Secondary | ICD-10-CM | POA: Diagnosis not present

## 2017-12-12 ENCOUNTER — Encounter: Payer: Self-pay | Admitting: Internal Medicine

## 2017-12-17 ENCOUNTER — Ambulatory Visit: Payer: Medicare Other | Admitting: Psychiatry

## 2017-12-27 DIAGNOSIS — M25512 Pain in left shoulder: Secondary | ICD-10-CM | POA: Diagnosis not present

## 2017-12-27 DIAGNOSIS — M519 Unspecified thoracic, thoracolumbar and lumbosacral intervertebral disc disorder: Secondary | ICD-10-CM | POA: Diagnosis not present

## 2018-01-09 DIAGNOSIS — M25512 Pain in left shoulder: Secondary | ICD-10-CM | POA: Diagnosis not present

## 2018-01-09 DIAGNOSIS — R3915 Urgency of urination: Secondary | ICD-10-CM | POA: Diagnosis not present

## 2018-01-09 DIAGNOSIS — M1710 Unilateral primary osteoarthritis, unspecified knee: Secondary | ICD-10-CM | POA: Diagnosis not present

## 2018-01-09 DIAGNOSIS — R350 Frequency of micturition: Secondary | ICD-10-CM | POA: Diagnosis not present

## 2018-01-13 ENCOUNTER — Encounter: Payer: Self-pay | Admitting: Internal Medicine

## 2018-01-13 MED ORDER — RANITIDINE HCL 150 MG PO TABS: 150.0000 mg | ORAL_TABLET | Freq: Two times a day (BID) | ORAL | 11 refills | Status: DC

## 2018-01-13 NOTE — Telephone Encounter (Signed)
Ok for zantac 150 bid - done erx

## 2018-01-14 ENCOUNTER — Ambulatory Visit (INDEPENDENT_AMBULATORY_CARE_PROVIDER_SITE_OTHER): Payer: Medicare Other | Admitting: Psychiatry

## 2018-01-14 DIAGNOSIS — F324 Major depressive disorder, single episode, in partial remission: Secondary | ICD-10-CM | POA: Diagnosis not present

## 2018-01-20 ENCOUNTER — Encounter: Payer: Self-pay | Admitting: Internal Medicine

## 2018-01-28 ENCOUNTER — Ambulatory Visit (INDEPENDENT_AMBULATORY_CARE_PROVIDER_SITE_OTHER): Payer: Medicare Other | Admitting: Psychiatry

## 2018-01-28 DIAGNOSIS — F324 Major depressive disorder, single episode, in partial remission: Secondary | ICD-10-CM | POA: Diagnosis not present

## 2018-01-29 ENCOUNTER — Encounter: Payer: Self-pay | Admitting: Internal Medicine

## 2018-01-31 ENCOUNTER — Encounter: Payer: Self-pay | Admitting: Internal Medicine

## 2018-01-31 MED ORDER — TRAMADOL HCL 50 MG PO TABS: 50.0000 mg | ORAL_TABLET | Freq: Four times a day (QID) | ORAL | 1 refills | Status: DC | PRN

## 2018-02-03 MED ORDER — LORAZEPAM 1 MG PO TABS: 1.0000 mg | ORAL_TABLET | Freq: Every day | ORAL | 2 refills | Status: DC

## 2018-02-03 NOTE — Addendum Note (Signed)
Addended by: [name] on: 02/03/2018 08:47 AM   Modules accepted: Orders

## 2018-02-06 ENCOUNTER — Encounter: Payer: Self-pay | Admitting: Internal Medicine

## 2018-02-06 ENCOUNTER — Other Ambulatory Visit: Payer: Self-pay | Admitting: Internal Medicine

## 2018-02-06 MED ORDER — RANITIDINE HCL 150 MG PO TABS: 150.0000 mg | ORAL_TABLET | Freq: Two times a day (BID) | ORAL | 0 refills | Status: DC

## 2018-02-10 DIAGNOSIS — H538 Other visual disturbances: Secondary | ICD-10-CM | POA: Diagnosis not present

## 2018-02-24 ENCOUNTER — Telehealth: Payer: Self-pay | Admitting: Internal Medicine

## 2018-02-24 ENCOUNTER — Encounter: Payer: Self-pay | Admitting: Internal Medicine

## 2018-02-24 NOTE — Telephone Encounter (Signed)
Patient has sent this request for an appointment: I am having problems with my legs & need to make a appointment.? My legs feel heavy , tire easily , & upon standing they seem to lack blood flow?Should I see a Vein Specialist?I have also noticed my other joints being unusually stiff? I have scheduled patient for 4/16.  Jonelle Sidle, could you please call to triage patient? Thanks!

## 2018-02-25 ENCOUNTER — Ambulatory Visit (INDEPENDENT_AMBULATORY_CARE_PROVIDER_SITE_OTHER): Payer: Medicare Other | Admitting: Internal Medicine

## 2018-02-25 ENCOUNTER — Encounter: Payer: Self-pay | Admitting: Internal Medicine

## 2018-02-25 VITALS — BP 124/84 | HR 78 | Temp 97.8°F | Ht 67.0 in | Wt 173.0 lb

## 2018-02-25 DIAGNOSIS — F419 Anxiety disorder, unspecified: Secondary | ICD-10-CM

## 2018-02-25 DIAGNOSIS — M79604 Pain in right leg: Secondary | ICD-10-CM | POA: Insufficient documentation

## 2018-02-25 DIAGNOSIS — M79605 Pain in left leg: Secondary | ICD-10-CM | POA: Diagnosis not present

## 2018-02-25 DIAGNOSIS — R739 Hyperglycemia, unspecified: Secondary | ICD-10-CM | POA: Diagnosis not present

## 2018-02-25 MED ORDER — AMITRIPTYLINE HCL 50 MG PO TABS: 50.0000 mg | ORAL_TABLET | Freq: Every evening | ORAL | 1 refills | Status: DC | PRN

## 2018-02-25 NOTE — Progress Notes (Signed)
Subjective:    Patient ID: Erik Lolling Sr., male    DOB: 16-Jul-1946, 72 y.o.   MRN: [id number]  HPI  Here with c/o bilat LE pain, sharp but mostly burning type to mostly below the knees, not clear if worse at night, mild to mod, may be worse with ambulation, intermittent overall for several months.   Pt denies polydipsia, polyuria, Denies worsening depressive symptoms, suicidal ideation, or panic; has ongoing anxiety, Pt denies chest pain, increased sob or doe, wheezing, orthopnea, PND, increased LE swelling, palpitations, dizziness or syncope.  No other interval hx or change Past Medical History:  Diagnosis Date  . Allergic rhinitis   . Allergy   . Anal fissure   . Anxiety   . CAD (coronary artery disease)   . Cataract    starting  . Coronary atherosclerosis of native coronary artery 2007   nonobstructive CAD by cath with 40% mid-LAD stenosis  . Degenerative arthritis of knee, bilateral 10/09/2017  . Depression   . Diverticulitis   . Diverticulosis 12/18/2011  . FIBROMYALGIA 06/09/2010   Qualifier: Diagnosis of  By: Carlean Purl MD, Dimas Millin Fibromyalgia   . GERD with stricture   . Helicobacter pylori (H. pylori) infection 11/25/2012   11/2012 EGD + gastric bxs  . Hiatal hernia   . HLD (hyperlipidemia)   . HTN (hypertension)   . Hyperlipidemia   . IBS (irritable bowel syndrome)   . Impotence of organic origin   . Insomnia   . Internal and external hemorrhoids without complication   . Interstitial cystitis   . INTERSTITIAL CYSTITIS 06/09/2010   Qualifier: Diagnosis of  By: Carlean Purl MD, Dimas Millin Irritable bowel syndrome 06/09/2010   Qualifier: Diagnosis of  By: Carlean Purl MD, Tonna Boehringer E   . Pelvic floor dysfunction 11/29/2017  . Reactive hypoglycemia   . Rheumatoid arthritis(714.0)   . 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 Vitamin D deficiency 11/29/2017   Past Surgical History:  Procedure Laterality Date  . bladder distention     x 6  . CATHETER REMOVAL     super  pubic area  . COLONOSCOPY  05/10/2006   diverticulosis, internal and external hemorrhoids  . COLONOSCOPY    . INTERSTIM IMPLANT PLACEMENT    . INTERSTIM IMPLANT REMOVAL    . KNEE ARTHROSCOPY     right x 2  . NISSEN FUNDOPLICATION  0998  . SHOULDER ARTHROSCOPY  2010   left  . TONSILLECTOMY AND ADENOIDECTOMY  1957  . TRANSURETHRAL RESECTION OF PROSTATE     x 2  . UPPER GASTROINTESTINAL ENDOSCOPY  05/13/2008   hiatal hernia  . VASECTOMY      reports that he has quit smoking. He has never used smokeless tobacco. He reports that he does not drink alcohol or use drugs. family history includes Breast cancer in his sister; Colon polyps in his father; Diabetes in his father, paternal uncle, and sister; Heart disease in his father and mother; Hypertension in his father and mother; Stroke in his mother. Allergies  Allergen Reactions  . Ambien [Zolpidem]   . Lexapro [Escitalopram Oxalate]   . Pneumovax [Pneumococcal Polysaccharide Vaccine] Other (See Comments)    pain  . Prilosec [Omeprazole] Other (See Comments)    Per patient joint pain with PPI's  . Remeron [Mirtazapine]   . Statins Other (See Comments)    Joint pain  . Tramadol Other (See Comments)   Current Outpatient Medications on File Prior to Visit  Medication Sig Dispense Refill  . Azelastine-Fluticasone 137-50 MCG/ACT SUSP 1 spray twice daily as needed 23 g 5  . Bioflavonoid Products (ESTER C PO) Take 1 capsule by mouth daily.    . bupivacaine (MARCAINE) 0.5 % SOLN injection Instill 15 cc into bladder daily    . cyclobenzaprine (FLEXERIL) 5 MG tablet 1-2 tab by  Mouth at bedtime as needed 180 tablet 1  . heparin 10000 UNIT/ML injection INSTILL 4ML INTO BLADDER DAILY  11  . hydrOXYzine (ATARAX/VISTARIL) 25 MG tablet Take 1 tablet (25 mg total) by mouth 3 (three) times daily as needed for anxiety. 270 tablet 3  . LORazepam (ATIVAN) 1 MG tablet Take 1 tablet (1 mg total) by mouth at bedtime. 30 tablet 2  . ranitidine (ZANTAC) 150  MG tablet TAKE 1 TABLET(150 MG) BY MOUTH TWICE DAILY 180 tablet 0  . traMADol (ULTRAM) 50 MG tablet Take 1 tablet (50 mg total) by mouth every 6 (six) hours as needed. 120 tablet 1   No current facility-administered medications on file prior to visit.    Review of Systems  Constitutional: Negative for other unusual diaphoresis or sweats HENT: Negative for ear discharge or swelling Eyes: Negative for other worsening visual disturbances Respiratory: Negative for stridor or other swelling  Gastrointestinal: Negative for worsening distension or other blood Genitourinary: Negative for retention or other urinary change Musculoskeletal: Negative for other MSK pain or swelling Skin: Negative for color change or other new lesions Neurological: Negative for worsening tremors and other numbness  Psychiatric/Behavioral: Negative for worsening agitation or other fatigue All other system neg per pt    Objective:   Physical Exam BP 124/84   Pulse 78   Temp 97.8 F (36.6 C) (Oral)   Ht 5\' 7"  (1.702 m)   Wt 173 lb (78.5 kg)   SpO2 96%   BMI 27.10 kg/m  VS noted,  Constitutional: Pt appears in NAD HENT: Head: NCAT.  Right Ear: External ear normal.  Left Ear: External ear normal.  Eyes: . Pupils are equal, round, and reactive to light. Conjunctivae and EOM are normal Nose: without d/c or deformity Neck: Neck supple. Gross normal ROM Cardiovascular: Normal rate and regular rhythm.   Pulmonary/Chest: Effort normal and breath sounds without rales or wheezing.  Abd:  Soft, NT, ND, + BS, no organomegaly Neurological: Pt is alert. At baseline orientation, motor grossly intact, has some reduced sens to LT to distals legs,  Skin: Skin is warm. No rashes, other new lesions, no LE edema, but trace to 1+ bilat dorsalis pedis Psychiatric: Pt behavior is normal without agitation , 1+ nervous No other exam findings  Lab Results  Component Value Date   WBC 10.2 11/29/2017   HGB 15.7 11/29/2017   HCT  46.0 11/29/2017   PLT 257.0 11/29/2017   GLUCOSE 115 (H) 11/29/2017   CHOL 212 (H) 11/29/2017   TRIG 383.0 (H) 11/29/2017   HDL 38.60 (L) 11/29/2017   LDLDIRECT 157.0 11/29/2017   LDLCALC 153 (H) 04/02/2017   ALT 18 11/29/2017   AST 17 11/29/2017   NA 136 11/29/2017   K 4.3 11/29/2017   CL 100 11/29/2017   CREATININE 1.30 11/29/2017   BUN 19 11/29/2017   CO2 29 11/29/2017   TSH 2.33 11/29/2017   PSA 0.97 11/29/2017   HGBA1C 5.9 11/29/2017       Assessment & Plan:

## 2018-02-25 NOTE — Telephone Encounter (Signed)
Called and talked with patient and wife---patient appears to be asymptomatic for blood clot, no swelling,redness,warmth or pain---patient reminded of his appt today

## 2018-02-25 NOTE — Patient Instructions (Signed)
Please take all new medication as prescribed - the generic for elavil 50 mg at bedtime as needed  You will be contacted regarding the referral for: Leg circulation test  Please continue all other medications as before, and refills have been done if requested.  Please have the pharmacy call with any other refills you may need.  Please keep your appointments with your specialists as you may have planned

## 2018-02-28 NOTE — Assessment & Plan Note (Signed)
stable overall by history and exam, recent data reviewed with pt, and pt to continue medical treatment as before,  to f/u any worsening symptoms or concerns le Lab Results  Component Value Date   HGBA1C 5.9 11/29/2017

## 2018-02-28 NOTE — Assessment & Plan Note (Signed)
Chronic stable, cont same tx 

## 2018-02-28 NOTE — Assessment & Plan Note (Signed)
Etiology unclear, diff includes neuritic vs vascular, ok for trial elavil qhs, also check LE arterial dopplers, cont to work on modifying CRF's

## 2018-03-04 ENCOUNTER — Other Ambulatory Visit: Payer: Self-pay | Admitting: Internal Medicine

## 2018-03-04 DIAGNOSIS — I739 Peripheral vascular disease, unspecified: Secondary | ICD-10-CM

## 2018-03-04 DIAGNOSIS — M79605 Pain in left leg: Secondary | ICD-10-CM

## 2018-03-04 DIAGNOSIS — M79604 Pain in right leg: Secondary | ICD-10-CM

## 2018-03-10 ENCOUNTER — Ambulatory Visit (HOSPITAL_COMMUNITY)
Admission: RE | Admit: 2018-03-10 | Discharge: 2018-03-10 | Disposition: A | Discharge status: Home / Self Care | Payer: Medicare Other | Source: Ambulatory Visit | Attending: Cardiovascular Disease | Admitting: Cardiovascular Disease

## 2018-03-10 DIAGNOSIS — M79604 Pain in right leg: Secondary | ICD-10-CM | POA: Diagnosis not present

## 2018-03-10 DIAGNOSIS — M79605 Pain in left leg: Secondary | ICD-10-CM | POA: Diagnosis not present

## 2018-03-10 DIAGNOSIS — I739 Peripheral vascular disease, unspecified: Secondary | ICD-10-CM | POA: Insufficient documentation

## 2018-03-13 ENCOUNTER — Ambulatory Visit (INDEPENDENT_AMBULATORY_CARE_PROVIDER_SITE_OTHER): Payer: Medicare Other | Admitting: Psychiatry

## 2018-03-13 DIAGNOSIS — M7542 Impingement syndrome of left shoulder: Secondary | ICD-10-CM | POA: Diagnosis not present

## 2018-03-13 DIAGNOSIS — M25512 Pain in left shoulder: Secondary | ICD-10-CM | POA: Diagnosis not present

## 2018-03-13 DIAGNOSIS — M519 Unspecified thoracic, thoracolumbar and lumbosacral intervertebral disc disorder: Secondary | ICD-10-CM | POA: Diagnosis not present

## 2018-03-13 DIAGNOSIS — M5412 Radiculopathy, cervical region: Secondary | ICD-10-CM | POA: Diagnosis not present

## 2018-03-13 DIAGNOSIS — F324 Major depressive disorder, single episode, in partial remission: Secondary | ICD-10-CM

## 2018-03-21 DIAGNOSIS — R3915 Urgency of urination: Secondary | ICD-10-CM | POA: Diagnosis not present

## 2018-03-21 DIAGNOSIS — R350 Frequency of micturition: Secondary | ICD-10-CM | POA: Diagnosis not present

## 2018-04-10 ENCOUNTER — Ambulatory Visit (INDEPENDENT_AMBULATORY_CARE_PROVIDER_SITE_OTHER): Payer: Medicare Other | Admitting: Internal Medicine

## 2018-04-10 ENCOUNTER — Encounter: Payer: Self-pay | Admitting: Internal Medicine

## 2018-04-10 VITALS — BP 122/76 | HR 84 | Temp 98.2°F | Ht 67.0 in | Wt 173.0 lb

## 2018-04-10 DIAGNOSIS — Z0000 Encounter for general adult medical examination without abnormal findings: Secondary | ICD-10-CM

## 2018-04-10 DIAGNOSIS — R739 Hyperglycemia, unspecified: Secondary | ICD-10-CM | POA: Diagnosis not present

## 2018-04-10 DIAGNOSIS — F32A Depression, unspecified: Secondary | ICD-10-CM

## 2018-04-10 DIAGNOSIS — F450 Somatization disorder: Secondary | ICD-10-CM | POA: Diagnosis not present

## 2018-04-10 DIAGNOSIS — F329 Major depressive disorder, single episode, unspecified: Secondary | ICD-10-CM | POA: Diagnosis not present

## 2018-04-10 NOTE — Patient Instructions (Signed)
Please continue all other medications as before, and refills have been done if requested.  Please have the pharmacy call with any other refills you may need.  Please continue your efforts at being more active, low cholesterol diet, and weight control.  You are otherwise up to date with prevention measures today.  Please keep your appointments with your specialists as you may have planned  Please return in 6 months, or sooner if needed, with Lab testing done 3-5 days before  

## 2018-04-10 NOTE — Progress Notes (Signed)
Subjective:    Patient ID: Erik Lolling Sr., male    DOB: 1946-08-12, 72 y.o.   MRN: [id number]  HPI   Here with 4 issues per pt; Zantac worked for the first 2 wks well, but then had worsening insomnia, added melatonin; wondering now if he is base or acidic overall.  Did have hco3 normal jan 2019. Wonders about lorazepam dependence so did not take the last 12 days, and no signs of w/d. Also having blurred vision in the evening, wonders if related to zantac.   Pt denies chest pain, increased sob or doe, wheezing, orthopnea, PND, increased LE swelling, palpitations, dizziness or syncope.   Pt denies polydipsia, polyuria, Denies worsening depressive symptoms, suicidal ideation, or panic Past Medical History:  Diagnosis Date  . Allergic rhinitis   . Allergy   . Anal fissure   . Anxiety   . CAD (coronary artery disease)   . Cataract    starting  . Coronary atherosclerosis of native coronary artery 2007   nonobstructive CAD by cath with 40% mid-LAD stenosis  . Degenerative arthritis of knee, bilateral 10/09/2017  . Depression   . Diverticulitis   . Diverticulosis 12/18/2011  . FIBROMYALGIA 06/09/2010   Qualifier: Diagnosis of  By: Carlean Purl MD, Dimas Millin Fibromyalgia   . GERD with stricture   . Helicobacter pylori (H. pylori) infection 11/25/2012   11/2012 EGD + gastric bxs  . Hiatal hernia   . HLD (hyperlipidemia)   . HTN (hypertension)   . Hyperlipidemia   . IBS (irritable bowel syndrome)   . Impotence of organic origin   . Insomnia   . Internal and external hemorrhoids without complication   . Interstitial cystitis   . INTERSTITIAL CYSTITIS 06/09/2010   Qualifier: Diagnosis of  By: Carlean Purl MD, Dimas Millin Irritable bowel syndrome 06/09/2010   Qualifier: Diagnosis of  By: Carlean Purl MD, Tonna Boehringer E   . Pelvic floor dysfunction 11/29/2017  . Reactive hypoglycemia   . Rheumatoid arthritis(714.0)   . 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 Vitamin D deficiency 11/29/2017   Past  Surgical History:  Procedure Laterality Date  . bladder distention     x 6  . CATHETER REMOVAL     super pubic area  . COLONOSCOPY  05/10/2006   diverticulosis, internal and external hemorrhoids  . COLONOSCOPY    . INTERSTIM IMPLANT PLACEMENT    . INTERSTIM IMPLANT REMOVAL    . KNEE ARTHROSCOPY     right x 2  . NISSEN FUNDOPLICATION  6294  . SHOULDER ARTHROSCOPY  2010   left  . TONSILLECTOMY AND ADENOIDECTOMY  1957  . TRANSURETHRAL RESECTION OF PROSTATE     x 2  . UPPER GASTROINTESTINAL ENDOSCOPY  05/13/2008   hiatal hernia  . VASECTOMY      reports that he has quit smoking. He has never used smokeless tobacco. He reports that he does not drink alcohol or use drugs. family history includes Breast cancer in his sister; Colon polyps in his father; Diabetes in his father, paternal uncle, and sister; Heart disease in his father and mother; Hypertension in his father and mother; Stroke in his mother. Allergies  Allergen Reactions  . Ambien [Zolpidem]   . Lexapro [Escitalopram Oxalate]   . Pneumovax [Pneumococcal Polysaccharide Vaccine] Other (See Comments)    pain  . Prilosec [Omeprazole] Other (See Comments)    Per patient joint pain with PPI's  . Remeron [Mirtazapine]   . Statins  Other (See Comments)    Joint pain  . Tramadol Other (See Comments)   Current Outpatient Medications on File Prior to Visit  Medication Sig Dispense Refill  . ranitidine (ZANTAC) 150 MG tablet TAKE 1 TABLET(150 MG) BY MOUTH TWICE DAILY 180 tablet 0   No current facility-administered medications on file prior to visit.    Review of Systems  Constitutional: Negative for other unusual diaphoresis or sweats HENT: Negative for ear discharge or swelling Eyes: Negative for other worsening visual disturbances Respiratory: Negative for stridor or other swelling  Gastrointestinal: Negative for worsening distension or other blood Genitourinary: Negative for retention or other urinary change Musculoskeletal:  Negative for other MSK pain or swelling Skin: Negative for color change or other new lesions Neurological: Negative for worsening tremors and other numbness  Psychiatric/Behavioral: Negative for worsening agitation or other fatigue All other system neg per pt    Objective:   Physical Exam BP 122/76   Pulse 84   Temp 98.2 F (36.8 C) (Oral)   Ht 5\' 7"  (1.702 m)   Wt 173 lb (78.5 kg)   SpO2 96%   BMI 27.10 kg/m  VS noted,  Constitutional: Pt appears in NAD HENT: Head: NCAT.  Right Ear: External ear normal.  Left Ear: External ear normal.  Eyes: . Pupils are equal, round, and reactive to light. Conjunctivae and EOM are normal Nose: without d/c or deformity Neck: Neck supple. Gross normal ROM Cardiovascular: Normal rate and regular rhythm.   Pulmonary/Chest: Effort normal and breath sounds without rales or wheezing.  Abd:  Soft, NT, ND, + BS, no organomegaly Neurological: Pt is alert. At baseline orientation, motor grossly intact Skin: Skin is warm. No rashes, other new lesions, no LE edema Psychiatric: Pt behavior is normal without agitation , 1+ nervous    Assessment & Plan:

## 2018-04-11 DIAGNOSIS — M5412 Radiculopathy, cervical region: Secondary | ICD-10-CM | POA: Diagnosis not present

## 2018-04-11 DIAGNOSIS — M25512 Pain in left shoulder: Secondary | ICD-10-CM | POA: Diagnosis not present

## 2018-04-11 DIAGNOSIS — M7542 Impingement syndrome of left shoulder: Secondary | ICD-10-CM | POA: Diagnosis not present

## 2018-04-11 DIAGNOSIS — M5030 Other cervical disc degeneration, unspecified cervical region: Secondary | ICD-10-CM | POA: Diagnosis not present

## 2018-04-12 ENCOUNTER — Encounter: Payer: Self-pay | Admitting: Internal Medicine

## 2018-04-12 NOTE — Assessment & Plan Note (Signed)
Chronic persistent,  to f/u any worsening symptoms or concerns

## 2018-04-12 NOTE — Assessment & Plan Note (Signed)
stable overall by history and exam, recent data reviewed with pt, and pt to continue medical treatment as before,  to f/u any worsening symptoms or concerns Lab Results  Component Value Date   HGBA1C 5.9 11/29/2017

## 2018-04-12 NOTE — Assessment & Plan Note (Signed)
stable overall by history and exam, and pt to continue medical treatment as before,  to f/u any worsening symptoms or concerns 

## 2018-04-14 ENCOUNTER — Encounter: Payer: Self-pay | Admitting: Internal Medicine

## 2018-04-16 DIAGNOSIS — M7542 Impingement syndrome of left shoulder: Secondary | ICD-10-CM | POA: Diagnosis not present

## 2018-04-17 ENCOUNTER — Ambulatory Visit (INDEPENDENT_AMBULATORY_CARE_PROVIDER_SITE_OTHER): Payer: Medicare Other | Admitting: Psychiatry

## 2018-04-17 DIAGNOSIS — F324 Major depressive disorder, single episode, in partial remission: Secondary | ICD-10-CM | POA: Diagnosis not present

## 2018-04-18 DIAGNOSIS — M25512 Pain in left shoulder: Secondary | ICD-10-CM | POA: Diagnosis not present

## 2018-04-18 DIAGNOSIS — M5030 Other cervical disc degeneration, unspecified cervical region: Secondary | ICD-10-CM | POA: Diagnosis not present

## 2018-04-18 DIAGNOSIS — M5412 Radiculopathy, cervical region: Secondary | ICD-10-CM | POA: Diagnosis not present

## 2018-04-18 DIAGNOSIS — M7542 Impingement syndrome of left shoulder: Secondary | ICD-10-CM | POA: Diagnosis not present

## 2018-04-21 ENCOUNTER — Encounter: Payer: Self-pay | Admitting: Internal Medicine

## 2018-04-21 NOTE — Telephone Encounter (Signed)
[place] for staff to contact pt - seem Mychart message today  If ok with pt, we can cancel the June 12 appt and will see him at his next visit also scheduled.

## 2018-04-22 ENCOUNTER — Encounter: Payer: Self-pay | Admitting: Physician Assistant

## 2018-04-22 ENCOUNTER — Other Ambulatory Visit (INDEPENDENT_AMBULATORY_CARE_PROVIDER_SITE_OTHER): Payer: Medicare Other

## 2018-04-22 ENCOUNTER — Ambulatory Visit: Payer: Medicare Other | Admitting: Physician Assistant

## 2018-04-22 VITALS — BP 108/62 | HR 64 | Ht 67.0 in | Wt 173.0 lb

## 2018-04-22 DIAGNOSIS — R1013 Epigastric pain: Secondary | ICD-10-CM

## 2018-04-22 DIAGNOSIS — R350 Frequency of micturition: Secondary | ICD-10-CM | POA: Diagnosis not present

## 2018-04-22 DIAGNOSIS — R3915 Urgency of urination: Secondary | ICD-10-CM | POA: Diagnosis not present

## 2018-04-22 LAB — COMPREHENSIVE METABOLIC PANEL
ALT: 18 U/L (ref 0–53)
AST: 15 U/L (ref 0–37)
Albumin: 4.5 g/dL (ref 3.5–5.2)
Alkaline Phosphatase: 79 U/L (ref 39–117)
BUN: 22 mg/dL (ref 6–23)
CO2: 29 mEq/L (ref 19–32)
Calcium: 10.1 mg/dL (ref 8.4–10.5)
Chloride: 102 mEq/L (ref 96–112)
Creatinine, Ser: 1.31 mg/dL (ref 0.40–1.50)
GFR: 57.31 mL/min — ABNORMAL LOW (ref >60.00)
Glucose, Bld: 95 mg/dL (ref 70–99)
Potassium: 4.3 mEq/L (ref 3.5–5.1)
Sodium: 139 mEq/L (ref 135–145)
Total Bilirubin: 0.3 mg/dL (ref 0.2–1.2)
Total Protein: 7.6 g/dL (ref 6.0–8.3)

## 2018-04-22 LAB — CBC WITH DIFFERENTIAL/PLATELET
Basophils Absolute: 0.1 10*3/uL (ref 0.0–0.1)
Basophils Relative: 0.7 % (ref 0.0–3.0)
Eosinophils Absolute: 0.1 10*3/uL (ref 0.0–0.7)
Eosinophils Relative: 1.1 % (ref 0.0–5.0)
HCT: 47.1 % (ref 39.0–52.0)
Hemoglobin: 16.1 g/dL (ref 13.0–17.0)
Lymphocytes Relative: 30.5 % (ref 12.0–46.0)
Lymphs Abs: 3.2 10*3/uL (ref 0.7–4.0)
MCHC: 34.1 g/dL (ref 30.0–36.0)
MCV: 90.4 fl (ref 78.0–100.0)
Monocytes Absolute: 1 10*3/uL (ref 0.1–1.0)
Monocytes Relative: 9.7 % (ref 3.0–12.0)
Neutro Abs: 6.1 10*3/uL (ref 1.4–7.7)
Neutrophils Relative %: 58 % (ref 43.0–77.0)
Platelets: 313 10*3/uL (ref 150.0–400.0)
RBC: 5.21 Mil/uL (ref 4.22–5.81)
RDW: 12.5 % (ref 11.5–15.5)
WBC: 10.4 10*3/uL (ref 4.0–10.5)

## 2018-04-22 MED ORDER — FAMOTIDINE 40 MG PO TABS: 40.0000 mg | ORAL_TABLET | Freq: Two times a day (BID) | ORAL | 4 refills | Status: DC

## 2018-04-22 NOTE — Progress Notes (Addendum)
Subjective:    Patient ID: Erik Lolling Sr., male    DOB: May 04, 1946, 72 y.o.   MRN: [id number]  HPI Rush Landmark is a pleasant 72 year old white male, known to Dr. Carlean Purl.  He has history of IBS, pelvic floor dysfunction, interstitial cystitis, and prior history of chronic GERD for which she is status post Nissen fundoplication. He last had EGD in January 2017 which showed moderate gastropathy and an intact fundoplication, there was no stricture. Colonoscopy done in January 2017 showed mild sigmoid diverticulosis, otherwise negative exam. Patient comes in today with 3 to 51-monthhistory of what he describes as a burning discomfort which is sometimes sharp in his epigastrium and subxiphoid region.  He feels that sometimes the Zantac which she has been taking over the past 3 months actually aggravates the discomfort and also feels that tramadol will aggravate the discomfort.  Patient had previously been on Ativan-but has weaned himself off over the past several months because he did not like the side effects and did not feel that he was having any significant benefit.  He denies any dysphagia or odynophagia.  He denies any nausea or vomiting.  He will occasionally get some mild sensation of food sticking with cornbread particularly.  He has a frequent sensation of bloating and does feel it is burning at times is worse at night waking him from sleep.  He does not feel that the burning is associated with eating or postprandial. On further questioning he generally does feel better with some food in his stomach. He has not been taking any regular aspirin or NSAIDs. He has  previously been intolerant to PPIs, at times has had some benefit with Tums and with milk.   Review of Systems Pertinent positive and negative review of systems were noted in the above HPI section.  All other review of systems was otherwise negative.  Outpatient Encounter Medications as of 04/22/2018  Medication Sig  . ranitidine (ZANTAC)  150 MG tablet TAKE 1 TABLET(150 MG) BY MOUTH TWICE DAILY  . famotidine (PEPCID) 40 MG tablet Take 1 tablet (40 mg total) by mouth 2 (two) times daily.   No facility-administered encounter medications on file as of 04/22/2018.    Allergies  Allergen Reactions  . Ambien [Zolpidem]   . Lexapro [Escitalopram Oxalate]   . Pneumovax [Pneumococcal Polysaccharide Vaccine] Other (See Comments)    pain  . Prilosec [Omeprazole] Other (See Comments)    Per patient joint pain with PPI's  . Remeron [Mirtazapine]   . Statins Other (See Comments)    Joint pain  . Tramadol Other (See Comments)   Patient Active Problem List   Diagnosis Date Noted  . Bilateral leg pain 02/25/2018  . Pelvic floor dysfunction 11/29/2017  . Vitamin D deficiency 11/29/2017  . Elevated PSA 10/09/2017  . Reactive hypoglycemia 10/09/2017  . Degenerative arthritis of knee, bilateral 10/09/2017  . Upper respiratory tract infection 08/17/2017  . Family history of osteoporosis 04/02/2017  . AKI (acute kidney injury) ([place] 04/02/2017  . Senile purpura ([facility] 10/14/2016  . Medication intolerances 06/26/2016  . Dyspepsia 06/26/2016  . Hyperglycemia 03/30/2016  . BPH (benign prostatic hypertrophy) with urinary obstruction 03/30/2016  . Abnormal CT of the abdomen 11/10/2015  . Right leg pain 09/01/2015  . Left shoulder pain 09/01/2015  . Fatigue 06/28/2015  . Bladder pain 03/25/2015  . Polyarthralgia 02/02/2015  . Abdominal pain, epigastric 02/01/2015  . Bloating 02/01/2015  . Bradycardia with 31 - 40 beats per minute 06/18/2014  .  Chest pain 03/16/2013  . Hyperlipidemia 02/18/2013  . Helicobacter pylori (H. pylori) infection 11/25/2012  . 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 Preventative health care 02/23/2012  . Allergic rhinitis   . Rheumatoid arthritis ([place])   . Insomnia   . Anxiety   . CAD (coronary artery disease)   . Diverticulitis   . GERD with stricture   . IBS (irritable bowel syndrome)   . Helicobacter  pylori gastritis   . Depression   . Internal and external hemorrhoids without complication   . Irritable bowel syndrome 06/09/2010  . Interstitial cystitis 06/09/2010  . FIBROMYALGIA 06/09/2010  . FLATULENCE-GAS-BLOATING 06/09/2010     Mr. Spisak family history includes Breast cancer in his sister; Colon polyps in his father; Diabetes in his father, paternal uncle, and sister; Heart disease in his father and mother; Hypertension in his father and mother; Stroke in his mother.      Objective:    Vitals:   04/22/18 1058  BP: 108/62  Pulse: 64    Physical Exam; well-developed older white male in no acute distress, pleasant blood pressure 108/62 pulse 64, height 5 foot 7, weight 173, BMI 27.1.  HEENT; nontraumatic normocephalic EOMI PERRLA sclera anicteric, Oropharynx clear, Cardiovascular ;regular rate and rhythm with S1-S2 no murmur rub or gallop, Pulmonary; clear bilaterally, he does have some tenderness over the xiphoid process Abdomen soft;, nontender there is no palpable mass or hepatosplenomegaly all sounds are present, Rectal; exam not done, Extremities ;no clubbing cyanosis or edema skin warm and dry, Neuro psych; mood and affect appropriate       Assessment & Plan:   #53-year-old white male with 3 to 39-monthhistory of subxiphoid burning type discomfort, intermittent, seems to be worse at night, and sometimes worse on an empty stomach. He also definitely has some xiphoid tenderness to palpation suggesting a musculoskeletal component.  He may have some costochondritis. Patient has had previously documented nonspecific gastropathy on EGD, and he likely has a component of them secondary to gastritis. 2.  IBS 3.  Interstitial cystitis 4.  Pelvic floor dysfunction 5.  History of chronic GERD status post prior Nissen 6.  Diverticulosis  Plan; as Zantac 150 twice daily does not seem to be offering much benefit, will discontinue and try Pepcid 40 mg p.o. twice daily Have also  asked him to try using a moist heating pad over the xiphoid area in the evenings, Trial of naproxen OTC 1 p.o. twice daily with food for 1 month as this may help any costochondritic component. CBC with differential, C met Patient will follow up with Dr. GCarlean Purlor myself in 1 month and knows to call in the interim for problems.  Sariyah Corcino SGenia HaroldPA-C 04/22/2018  Agree with Ms. EGenia Haroldassessment and plan.  He messaged back 6/28 and went back to Zantac  He is asking about an alpha genome test which indicates how he metabolizes medications - not familiar but will investigate  ? If he would be helped by FClaudette Staplerand will message him and ask him to try   CGatha Mayer MD, FMarval Regal   Cc: JBiagio Borg MD

## 2018-04-22 NOTE — Patient Instructions (Addendum)
Your provider has requested that you go to the basement level for lab work before leaving today. Press "B" on the elevator. The lab is located at the first door on the left as you exit the elevator. Stop the Tramadol. Start Pepcid 40 mg twice daily.  We sent refills to the pharmacy. Use Aleve or Ibuprofen twice daily with food. Use Gavascon  every 4 hours as needed.   Call us in 3-4 weeks with a progress report. Ask for Amy's nurse. [phone], choose option 2.

## 2018-04-23 ENCOUNTER — Encounter: Payer: Self-pay | Admitting: Internal Medicine

## 2018-05-07 ENCOUNTER — Other Ambulatory Visit: Payer: Self-pay | Admitting: Internal Medicine

## 2018-05-07 DIAGNOSIS — L578 Other skin changes due to chronic exposure to nonionizing radiation: Secondary | ICD-10-CM | POA: Diagnosis not present

## 2018-05-07 DIAGNOSIS — L820 Inflamed seborrheic keratosis: Secondary | ICD-10-CM | POA: Diagnosis not present

## 2018-05-07 DIAGNOSIS — D1801 Hemangioma of skin and subcutaneous tissue: Secondary | ICD-10-CM | POA: Diagnosis not present

## 2018-05-09 ENCOUNTER — Encounter: Payer: Self-pay | Admitting: Physician Assistant

## 2018-05-09 DIAGNOSIS — M5412 Radiculopathy, cervical region: Secondary | ICD-10-CM | POA: Diagnosis not present

## 2018-05-14 ENCOUNTER — Ambulatory Visit (INDEPENDENT_AMBULATORY_CARE_PROVIDER_SITE_OTHER): Payer: Medicare Other | Admitting: Psychiatry

## 2018-05-14 DIAGNOSIS — F324 Major depressive disorder, single episode, in partial remission: Secondary | ICD-10-CM

## 2018-05-20 DIAGNOSIS — M5412 Radiculopathy, cervical region: Secondary | ICD-10-CM | POA: Diagnosis not present

## 2018-05-22 ENCOUNTER — Encounter: Payer: Self-pay | Admitting: Internal Medicine

## 2018-05-22 ENCOUNTER — Other Ambulatory Visit: Payer: Self-pay | Admitting: Internal Medicine

## 2018-05-22 MED ORDER — CILIDINIUM-CHLORDIAZEPOXIDE 2.5-5 MG PO CAPS: 1.0000 | ORAL_CAPSULE | Freq: Three times a day (TID) | ORAL | 0 refills | Status: DC

## 2018-05-28 ENCOUNTER — Other Ambulatory Visit: Payer: Self-pay | Admitting: Internal Medicine

## 2018-05-28 ENCOUNTER — Encounter: Payer: Self-pay | Admitting: Internal Medicine

## 2018-05-29 DIAGNOSIS — M6289 Other specified disorders of muscle: Secondary | ICD-10-CM | POA: Diagnosis not present

## 2018-05-29 DIAGNOSIS — K580 Irritable bowel syndrome with diarrhea: Secondary | ICD-10-CM | POA: Diagnosis not present

## 2018-05-29 DIAGNOSIS — M069 Rheumatoid arthritis, unspecified: Secondary | ICD-10-CM | POA: Diagnosis not present

## 2018-05-29 DIAGNOSIS — N3010 Interstitial cystitis (chronic) without hematuria: Secondary | ICD-10-CM | POA: Diagnosis not present

## 2018-05-29 DIAGNOSIS — M797 Fibromyalgia: Secondary | ICD-10-CM | POA: Diagnosis not present

## 2018-06-02 ENCOUNTER — Telehealth: Payer: Self-pay | Admitting: Internal Medicine

## 2018-06-02 NOTE — Telephone Encounter (Signed)
I called [facility] and spoke with customer service rep and she ran the prior authorization and it comes up as a plan exclusion drug. His member # is [id number], Dx IBS, K58.9. I then called the pharmacy and the patient paid for #15 librax and it was $31.59. Please advise Sir?

## 2018-06-02 NOTE — Telephone Encounter (Signed)
I called the pharmacy and got a help desk # to call [phone] to do the prior authorization.

## 2018-06-03 NOTE — Telephone Encounter (Signed)
Left message with his wife for him to call me back.

## 2018-06-03 NOTE — Telephone Encounter (Signed)
I cannot make it cheaper   Does he want to continue it?

## 2018-06-04 NOTE — Telephone Encounter (Signed)
Please recommend something else he could try Sir. He said the insurance company mentioned Bentyl. Would that be a possibility?

## 2018-06-06 NOTE — Telephone Encounter (Signed)
He has tried in past but could try again.  20 mg tid ac  #90 2 RF  I also think testing for SIBO with lactulose hydrogen breath test makes sense if he is willing - I do not think he has done that before have him do that if he is willing  Tell him I do not think the alpha genomix information he asked about would help me treat him better so I do not recommend it  It could help in other treatment but he should ask his other doctors

## 2018-06-09 NOTE — Telephone Encounter (Signed)
Left [name] a message to call me back.

## 2018-06-11 MED ORDER — DICYCLOMINE HCL 20 MG PO TABS: 20.0000 mg | ORAL_TABLET | Freq: Three times a day (TID) | ORAL | 2 refills | Status: DC

## 2018-06-11 NOTE — Telephone Encounter (Signed)
Spoke with wife and she will tell him to call me when he gets in today.

## 2018-06-11 NOTE — Telephone Encounter (Signed)
I spoke with [name] and he will try the bentyl again and he is going to call and try to find out if his insurance company will cover the breath test. He will call us back if he decides to do this testing.

## 2018-06-16 ENCOUNTER — Ambulatory Visit: Payer: Self-pay | Admitting: Internal Medicine

## 2018-06-16 ENCOUNTER — Other Ambulatory Visit (INDEPENDENT_AMBULATORY_CARE_PROVIDER_SITE_OTHER): Payer: Medicare Other

## 2018-06-16 DIAGNOSIS — R300 Dysuria: Secondary | ICD-10-CM

## 2018-06-16 LAB — URINALYSIS, ROUTINE W REFLEX MICROSCOPIC
Bilirubin Urine: NEGATIVE
Hgb urine dipstick: NEGATIVE
Ketones, ur: NEGATIVE
Leukocytes, UA: NEGATIVE
Nitrite: NEGATIVE
RBC / HPF: NONE SEEN (ref 0–?)
Specific Gravity, Urine: <=1.005 — AB (ref 1.000–1.030)
Total Protein, Urine: NEGATIVE
Urine Glucose: NEGATIVE
Urobilinogen, UA: 0.2 (ref 0.0–1.0)
WBC, UA: NONE SEEN (ref 0–?)
pH: 7 (ref 5.0–8.0)

## 2018-06-16 NOTE — Telephone Encounter (Signed)
Ok for labs as ordered

## 2018-06-16 NOTE — Telephone Encounter (Signed)
I returned his call.    C/o having urinary frequency and burning since Saturday but it was worse last night.    He has interstitial cystitis and has to cath himself to urinate most of the time.  He is requesting not to come in but to give a urine sample and Dr. Jenny Reichmann call in an Rx.    "He does this for me since I've had UTIs before with the interstitial cystitis".  I have routed a high priority note to Dr. Cathlean Cower.    I let pt know someone would be in contact with him regarding Dr. Gwynn Burly decision.   Reason for Disposition . All other males with painful urination    Has interstitial cystitis and has to cath himself most times to urinate.  Answer Assessment - Initial Assessment Questions 1. SEVERITY: "How bad is the pain?"  (e.g., Scale 1-10; mild, moderate, or severe)   - MILD (1-3): complains slightly about urination hurting   - MODERATE (4-7): interferes with normal activities     - SEVERE (8-10): excruciating, unwilling or unable to urinate because of the pain      Up and down all night with burning and frequency.    2. FREQUENCY: "How many times have you had painful urination today?"      Started Saturday.   "I have to cath my self most of the time due to the interstitial cystitis.  I've had UTI before and he lets me bring in a sample and calls in an antibiotic for me without seeing me. 3. PATTERN: "Is pain present every time you urinate or just sometimes?"      I have to cath to urinate most times. 4. ONSET: "When did the painful urination start?"      Saturday but  It was worse last night with me being up and down all night with frequency and burning. 5. FEVER: "Do you have a fever?" If so, ask: "What is your temperature, how was it measured, and when did it start?"     No fever.    Having aches in my joints. 6. PAST UTI: "Have you had a urine infection before?" If so, ask: "When was the last time?" and "What happened that time?"      Yes.   See above. 7. CAUSE: "What do you think  is causing the painful urination?"      Urinary tract infection 8. OTHER SYMPTOMS: "Do you have any other symptoms?" (e.g., flank pain, penile discharge, scrotal pain, blood in urine)     No  Protocols used: URINATION PAIN - MALE-A-AH

## 2018-06-16 NOTE — Addendum Note (Signed)
Addended by: [name] on: 06/16/2018 12:50 PM   Modules accepted: Orders

## 2018-06-16 NOTE — Telephone Encounter (Signed)
Pt's wife informed and expressed understanding.

## 2018-06-17 LAB — URINE CULTURE
MICRO NUMBER:: 90922748
SPECIMEN QUALITY:: ADEQUATE

## 2018-06-23 ENCOUNTER — Other Ambulatory Visit: Payer: Self-pay | Admitting: Internal Medicine

## 2018-06-23 NOTE — Telephone Encounter (Signed)
Done erx 

## 2018-06-24 ENCOUNTER — Other Ambulatory Visit (INDEPENDENT_AMBULATORY_CARE_PROVIDER_SITE_OTHER): Payer: Medicare Other

## 2018-06-24 ENCOUNTER — Ambulatory Visit (INDEPENDENT_AMBULATORY_CARE_PROVIDER_SITE_OTHER): Payer: Medicare Other | Admitting: Internal Medicine

## 2018-06-24 ENCOUNTER — Encounter: Payer: Self-pay | Admitting: Internal Medicine

## 2018-06-24 VITALS — BP 118/76 | HR 78 | Temp 98.1°F | Ht 67.0 in | Wt 172.0 lb

## 2018-06-24 DIAGNOSIS — F32A Depression, unspecified: Secondary | ICD-10-CM

## 2018-06-24 DIAGNOSIS — Z0000 Encounter for general adult medical examination without abnormal findings: Secondary | ICD-10-CM | POA: Diagnosis not present

## 2018-06-24 DIAGNOSIS — E559 Vitamin D deficiency, unspecified: Secondary | ICD-10-CM

## 2018-06-24 DIAGNOSIS — F329 Major depressive disorder, single episode, unspecified: Secondary | ICD-10-CM | POA: Diagnosis not present

## 2018-06-24 DIAGNOSIS — R739 Hyperglycemia, unspecified: Secondary | ICD-10-CM | POA: Diagnosis not present

## 2018-06-24 LAB — CBC WITH DIFFERENTIAL/PLATELET
Basophils Absolute: 0.1 10*3/uL (ref 0.0–0.1)
Basophils Relative: 0.8 % (ref 0.0–3.0)
Eosinophils Absolute: 0.1 10*3/uL (ref 0.0–0.7)
Eosinophils Relative: 0.9 % (ref 0.0–5.0)
HCT: 45.7 % (ref 39.0–52.0)
Hemoglobin: 16 g/dL (ref 13.0–17.0)
Lymphocytes Relative: 35.2 % (ref 12.0–46.0)
Lymphs Abs: 3.1 10*3/uL (ref 0.7–4.0)
MCHC: 35 g/dL (ref 30.0–36.0)
MCV: 88.9 fl (ref 78.0–100.0)
Monocytes Absolute: 0.9 10*3/uL (ref 0.1–1.0)
Monocytes Relative: 10.1 % (ref 3.0–12.0)
Neutro Abs: 4.6 10*3/uL (ref 1.4–7.7)
Neutrophils Relative %: 53 % (ref 43.0–77.0)
Platelets: 296 10*3/uL (ref 150.0–400.0)
RBC: 5.14 Mil/uL (ref 4.22–5.81)
RDW: 13.3 % (ref 11.5–15.5)
WBC: 8.7 10*3/uL (ref 4.0–10.5)

## 2018-06-24 LAB — URINALYSIS, ROUTINE W REFLEX MICROSCOPIC
Bilirubin Urine: NEGATIVE
Ketones, ur: NEGATIVE
Leukocytes, UA: NEGATIVE
Nitrite: NEGATIVE
RBC / HPF: NONE SEEN (ref 0–?)
Specific Gravity, Urine: 1.02 (ref 1.000–1.030)
Total Protein, Urine: NEGATIVE
Urine Glucose: NEGATIVE
Urobilinogen, UA: 0.2 (ref 0.0–1.0)
pH: 5.5 (ref 5.0–8.0)

## 2018-06-24 LAB — LIPID PANEL
Cholesterol: 229 mg/dL — ABNORMAL HIGH (ref 0–200)
HDL: 38.5 mg/dL — ABNORMAL LOW (ref >39.00)
NonHDL: 190.48
Total CHOL/HDL Ratio: 6
Triglycerides: 333 mg/dL — ABNORMAL HIGH (ref 0.0–149.0)
VLDL: 66.6 mg/dL — ABNORMAL HIGH (ref 0.0–40.0)

## 2018-06-24 LAB — BASIC METABOLIC PANEL
BUN: 17 mg/dL (ref 6–23)
CO2: 29 mEq/L (ref 19–32)
Calcium: 10.2 mg/dL (ref 8.4–10.5)
Chloride: 102 mEq/L (ref 96–112)
Creatinine, Ser: 1.23 mg/dL (ref 0.40–1.50)
GFR: 61.6 mL/min (ref >60.00)
Glucose, Bld: 95 mg/dL (ref 70–99)
Potassium: 4.1 mEq/L (ref 3.5–5.1)
Sodium: 137 mEq/L (ref 135–145)

## 2018-06-24 LAB — HEPATIC FUNCTION PANEL
ALT: 27 U/L (ref 0–53)
AST: 43 U/L — ABNORMAL HIGH (ref 0–37)
Albumin: 4.5 g/dL (ref 3.5–5.2)
Alkaline Phosphatase: 66 U/L (ref 39–117)
Bilirubin, Direct: 0.1 mg/dL (ref 0.0–0.3)
Total Bilirubin: 0.6 mg/dL (ref 0.2–1.2)
Total Protein: 7.3 g/dL (ref 6.0–8.3)

## 2018-06-24 LAB — LDL CHOLESTEROL, DIRECT: Direct LDL: 169 mg/dL

## 2018-06-24 LAB — HEMOGLOBIN A1C: Hgb A1c MFr Bld: 6.1 % (ref 4.6–6.5)

## 2018-06-24 LAB — PSA: PSA: 0.95 ng/mL (ref 0.10–4.00)

## 2018-06-24 LAB — VITAMIN D 25 HYDROXY (VIT D DEFICIENCY, FRACTURES): VITD: 29.26 ng/mL — ABNORMAL LOW (ref 30.00–100.00)

## 2018-06-24 LAB — TSH: TSH: 2.39 u[IU]/mL (ref 0.35–4.50)

## 2018-06-24 MED ORDER — CITALOPRAM HYDROBROMIDE 10 MG PO TABS: 10.0000 mg | ORAL_TABLET | Freq: Every day | ORAL | 3 refills | Status: DC

## 2018-06-24 NOTE — Patient Instructions (Signed)
Please take all new medication as prescribed - the citalopram 10 mg per day  Please continue all other medications as before, and refills have been done if requested.  Please have the pharmacy call with any other refills you may need.  Please continue your efforts at being more active, low cholesterol diet, and weight control.  You are otherwise up to date with prevention measures today.  Please keep your appointments with your specialists as you may have planned  Please go to the LAB in the Basement (turn left off the elevator) for the tests to be done today  You will be contacted by phone if any changes need to be made immediately.  Otherwise, you will receive a letter about your results with an explanation, but please check with MyChart first.  Please remember to sign up for MyChart if you have not done so, as this will be important to you in the future with finding out test results, communicating by private email, and scheduling acute appointments online when needed.  Please return in 6 months, or sooner if needed

## 2018-06-24 NOTE — Progress Notes (Signed)
Subjective:    Patient ID: Erik Lolling Sr., male    DOB: [birth year], 72 y.o.   MRN: [id number]  HPI  Here for wellness and f/u;  Overall doing ok;  Pt denies Chest pain, worsening SOB, DOE, wheezing, orthopnea, PND, worsening LE edema, palpitations, dizziness or syncope.  Pt denies neurological change such as new headache, facial or extremity weakness.  Pt denies polydipsia, polyuria, or low sugar symptoms. Pt states overall good compliance with treatment and medications, good tolerability, and has been trying to follow appropriate diet.  Pt has had mild ongoing depressive symptoms, but no suicidal ideation or panic. No fever, night sweats, wt loss, loss of appetite, or other constitutional symptoms.  Pt states good ability with ADL's, has low fall risk, home safety reviewed and adequate, no other significant changes in hearing or vision, and only occasionally active with exercise. 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 Allergic rhinitis   . Allergy   . Anal fissure   . Anxiety   . CAD (coronary artery disease)   . Cataract    starting  . Coronary atherosclerosis of native coronary artery 2007   nonobstructive CAD by cath with 40% mid-LAD stenosis  . Degenerative arthritis of knee, bilateral 10/09/2017  . Depression   . Diverticulitis   . Diverticulosis 12/18/2011  . FIBROMYALGIA 06/09/2010   Qualifier: Diagnosis of  By: Carlean Purl MD, Dimas Millin Fibromyalgia   . GERD with stricture   . Helicobacter pylori (H. pylori) infection 11/25/2012   11/2012 EGD + gastric bxs  . Hiatal hernia   . HLD (hyperlipidemia)   . HTN (hypertension)   . Hyperlipidemia   . IBS (irritable bowel syndrome)   . Impotence of organic origin   . Insomnia   . Internal and external hemorrhoids without  complication   . Interstitial cystitis   . INTERSTITIAL CYSTITIS 06/09/2010   Qualifier: Diagnosis of  By: Carlean Purl MD, Dimas Millin Irritable bowel syndrome 06/09/2010   Qualifier: Diagnosis of  By: Carlean Purl MD, Tonna Boehringer E   . Pelvic floor dysfunction 11/29/2017  . Reactive hypoglycemia   . Rheumatoid arthritis(714.0)   . 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 Vitamin D deficiency 11/29/2017   Past Surgical History:  Procedure Laterality Date  . bladder distention     x 6  . CATHETER REMOVAL     super pubic area  . COLONOSCOPY  05/10/2006   diverticulosis, internal and external hemorrhoids  . COLONOSCOPY    . INTERSTIM IMPLANT PLACEMENT    . INTERSTIM IMPLANT REMOVAL    . KNEE ARTHROSCOPY     right x 2  . NISSEN FUNDOPLICATION  7628  . SHOULDER ARTHROSCOPY  2010   left  . TONSILLECTOMY AND ADENOIDECTOMY  1957  . TRANSURETHRAL RESECTION OF PROSTATE     x 2  . UPPER GASTROINTESTINAL ENDOSCOPY  05/13/2008   hiatal hernia  . VASECTOMY      reports that he has quit smoking. He has never used smokeless tobacco. He reports that he does not drink alcohol or use drugs. family history includes Breast cancer in his sister; Colon polyps in his father; Diabetes in his father, paternal uncle, and sister; Heart disease in his father and mother; Hypertension in his  father and mother; Stroke in his mother. Allergies  Allergen Reactions  . Ambien [Zolpidem]   . Lexapro [Escitalopram Oxalate]   . Pneumovax [Pneumococcal Polysaccharide Vaccine] Other (See Comments)    pain  . Prilosec [Omeprazole] Other (See Comments)    Per patient joint pain with PPI's  . Remeron [Mirtazapine]   . Statins Other (See Comments)    Joint pain  . Tramadol Other (See Comments)   Current Outpatient Medications on File Prior to Visit  Medication Sig Dispense Refill  . dicyclomine (BENTYL) 20 MG tablet Take 1 tablet (20 mg total) by mouth 3 (three) times daily before meals. 90 tablet 2  . famotidine (PEPCID) 40  MG tablet Take 1 tablet (40 mg total) by mouth 2 (two) times daily. 60 tablet 4  . LORazepam (ATIVAN) 0.5 MG tablet TAKE 1 TABLET BY MOUTH TWICE DAILY AS NEEDED FOR ANXIETY 60 tablet 1  . ranitidine (ZANTAC) 150 MG tablet TAKE 1 TABLET BY MOUTH TWICE DAILY 180 tablet 0   No current facility-administered medications on file prior to visit.    Review of Systems Constitutional: Negative for other unusual diaphoresis, sweats, appetite or weight changes HENT: Negative for other worsening hearing loss, ear pain, facial swelling, mouth sores or neck stiffness.   Eyes: Negative for other worsening pain, redness or other visual disturbance.  Respiratory: Negative for other stridor or swelling Cardiovascular: Negative for other palpitations or other chest pain  Gastrointestinal: Negative for worsening diarrhea or loose stools, blood in stool, distention or other pain Genitourinary: Negative for hematuria, flank pain or other change in urine volume.  Musculoskeletal: Negative for myalgias or other joint swelling.  Skin: Negative for other color change, or other wound or worsening drainage.  Neurological: Negative for other syncope or numbness. Hematological: Negative for other adenopathy or swelling Psychiatric/Behavioral: Negative for hallucinations, other worsening agitation, SI, self-injury, or new decreased concentration ALl other system neg per pt    Objective:   Physical Exam BP 118/76   Pulse 78   Temp 98.1 F (36.7 C) (Oral)   Ht 5\' 7"  (1.702 m)   Wt 172 lb (78 kg)   SpO2 96%   BMI 26.94 kg/m  VS noted,  Constitutional: Pt is oriented to person, place, and time. Appears well-developed and well-nourished, in no significant distress and comfortable Head: Normocephalic and atraumatic  Eyes: Conjunctivae and EOM are normal. Pupils are equal, round, and reactive to light Right Ear: External ear normal without discharge Left Ear: External ear normal without discharge Nose: Nose without  discharge or deformity Mouth/Throat: Oropharynx is without other ulcerations and moist  Neck: Normal range of motion. Neck supple. No JVD present. No tracheal deviation present or significant neck LA or mass Cardiovascular: Normal rate, regular rhythm, normal heart sounds and intact distal pulses.   Pulmonary/Chest: WOB normal and breath sounds without rales or wheezing  Abdominal: Soft. Bowel sounds are normal. NT. No HSM  Musculoskeletal: Normal range of motion. Exhibits no edema Lymphadenopathy: Has no other cervical adenopathy.  Neurological: Pt is alert and oriented to person, place, and time. Pt has normal reflexes. No cranial nerve deficit. Motor grossly intact, Gait intact Skin: Skin is warm and dry. No rash noted or new ulcerations Psychiatric:  Has nervous depressed mood and affect. Behavior is normal without agitation No other exam findings Lab Results  Component Value Date   WBC 8.7 06/24/2018   HGB 16.0 06/24/2018   HCT 45.7 06/24/2018   PLT 296.0 06/24/2018   GLUCOSE  95 06/24/2018   CHOL 229 (H) 06/24/2018   TRIG 333.0 (H) 06/24/2018   HDL 38.50 (L) 06/24/2018   LDLDIRECT 169.0 06/24/2018   LDLCALC 153 (H) 04/02/2017   ALT 27 06/24/2018   AST 43 (H) 06/24/2018   NA 137 06/24/2018   K 4.1 06/24/2018   CL 102 06/24/2018   CREATININE 1.23 06/24/2018   BUN 17 06/24/2018   CO2 29 06/24/2018   TSH 2.39 06/24/2018   PSA 0.95 06/24/2018   HGBA1C 6.1 06/24/2018        Assessment & Plan:

## 2018-06-24 NOTE — Assessment & Plan Note (Signed)
Ok for celexa 10 qd 

## 2018-06-24 NOTE — Assessment & Plan Note (Signed)
For fu lab,  to f/u any worsening symptoms or concerns  

## 2018-06-24 NOTE — Assessment & Plan Note (Signed)
stable overall by history and exam, recent data reviewed with pt, and pt to continue medical treatment as before,  to f/u any worsening symptoms or concerns Lab Results  Component Value Date   HGBA1C 6.1 06/24/2018

## 2018-06-24 NOTE — Assessment & Plan Note (Signed)

## 2018-06-26 ENCOUNTER — Ambulatory Visit: Payer: Medicare Other | Admitting: Internal Medicine

## 2018-06-26 ENCOUNTER — Encounter: Payer: Self-pay | Admitting: Internal Medicine

## 2018-06-26 ENCOUNTER — Ambulatory Visit (INDEPENDENT_AMBULATORY_CARE_PROVIDER_SITE_OTHER): Payer: Medicare Other | Admitting: Psychiatry

## 2018-06-26 VITALS — BP 116/68 | HR 84 | Ht 66.25 in | Wt 173.4 lb

## 2018-06-26 DIAGNOSIS — K588 Other irritable bowel syndrome: Secondary | ICD-10-CM | POA: Diagnosis not present

## 2018-06-26 DIAGNOSIS — N9489 Other specified conditions associated with female genital organs and menstrual cycle: Secondary | ICD-10-CM

## 2018-06-26 DIAGNOSIS — F324 Major depressive disorder, single episode, in partial remission: Secondary | ICD-10-CM | POA: Diagnosis not present

## 2018-06-26 DIAGNOSIS — N3010 Interstitial cystitis (chronic) without hematuria: Secondary | ICD-10-CM | POA: Diagnosis not present

## 2018-06-26 DIAGNOSIS — K30 Functional dyspepsia: Secondary | ICD-10-CM | POA: Diagnosis not present
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
## 2018-06-26 NOTE — Patient Instructions (Signed)
  Try taking Bentyl at bedtime.   Dr Carlean Purl is going to talk with Dr Amalia Hailey about re-trying P.T.    We are going to work on ordering genetic testing for you.     I appreciate the opportunity to care for you. Erik Rusk, MD, [facility]

## 2018-06-29 ENCOUNTER — Encounter: Payer: Self-pay | Admitting: Internal Medicine

## 2018-07-18 ENCOUNTER — Telehealth: Payer: Self-pay

## 2018-07-18 DIAGNOSIS — M6289 Other specified disorders of muscle: Secondary | ICD-10-CM

## 2018-07-18 NOTE — Telephone Encounter (Signed)
Patient notified of recommendations He has already established an appt with PT

## 2018-07-18 NOTE — Telephone Encounter (Signed)
Patient referral placed. Left message for patient to call back

## 2018-07-18 NOTE — Telephone Encounter (Signed)
-----   Message from Gatha Mayer, MD sent at 07/17/2018  1:35 PM EDT ----- Regarding: refer for pelvic PT Please refer to [facility] Malachy Mood or Kennyth Lose) dx pelvic floor dysfunction

## 2018-07-24 ENCOUNTER — Ambulatory Visit (INDEPENDENT_AMBULATORY_CARE_PROVIDER_SITE_OTHER): Payer: Medicare Other | Admitting: Psychiatry

## 2018-07-24 DIAGNOSIS — F324 Major depressive disorder, single episode, in partial remission: Secondary | ICD-10-CM | POA: Diagnosis not present

## 2018-07-30 ENCOUNTER — Encounter: Payer: Self-pay | Admitting: Internal Medicine

## 2018-08-04 ENCOUNTER — Ambulatory Visit: Payer: Medicare Other | Admitting: Physical Therapy

## 2018-08-05 ENCOUNTER — Encounter: Payer: Self-pay | Admitting: Internal Medicine

## 2018-08-06 DIAGNOSIS — N3010 Interstitial cystitis (chronic) without hematuria: Secondary | ICD-10-CM | POA: Diagnosis not present

## 2018-08-10 ENCOUNTER — Encounter: Payer: Self-pay | Admitting: Internal Medicine

## 2018-08-12 ENCOUNTER — Ambulatory Visit: Payer: Medicare Other | Admitting: Physical Therapy

## 2018-08-12 ENCOUNTER — Ambulatory Visit: Payer: Medicare Other | Admitting: Psychiatry

## 2018-08-15 DIAGNOSIS — R350 Frequency of micturition: Secondary | ICD-10-CM | POA: Diagnosis not present

## 2018-08-15 DIAGNOSIS — R3915 Urgency of urination: Secondary | ICD-10-CM | POA: Diagnosis not present

## 2018-08-19 DIAGNOSIS — M6281 Muscle weakness (generalized): Secondary | ICD-10-CM | POA: Diagnosis not present

## 2018-08-19 DIAGNOSIS — M62838 Other muscle spasm: Secondary | ICD-10-CM | POA: Diagnosis not present

## 2018-08-19 DIAGNOSIS — N3010 Interstitial cystitis (chronic) without hematuria: Secondary | ICD-10-CM | POA: Diagnosis not present

## 2018-09-02 DIAGNOSIS — N3010 Interstitial cystitis (chronic) without hematuria: Secondary | ICD-10-CM | POA: Diagnosis not present

## 2018-09-02 DIAGNOSIS — M6281 Muscle weakness (generalized): Secondary | ICD-10-CM | POA: Diagnosis not present

## 2018-09-02 DIAGNOSIS — M6289 Other specified disorders of muscle: Secondary | ICD-10-CM | POA: Diagnosis not present

## 2018-09-02 DIAGNOSIS — M62838 Other muscle spasm: Secondary | ICD-10-CM | POA: Diagnosis not present

## 2018-09-02 DIAGNOSIS — K5909 Other constipation: Secondary | ICD-10-CM | POA: Diagnosis not present

## 2018-09-09 DIAGNOSIS — M62838 Other muscle spasm: Secondary | ICD-10-CM | POA: Diagnosis not present

## 2018-09-09 DIAGNOSIS — N3010 Interstitial cystitis (chronic) without hematuria: Secondary | ICD-10-CM | POA: Diagnosis not present

## 2018-09-09 DIAGNOSIS — M6281 Muscle weakness (generalized): Secondary | ICD-10-CM | POA: Diagnosis not present

## 2018-09-15 ENCOUNTER — Ambulatory Visit: Payer: Self-pay

## 2018-09-15 ENCOUNTER — Ambulatory Visit: Payer: Self-pay | Admitting: *Deleted

## 2018-09-15 NOTE — Telephone Encounter (Signed)
Pt. Returned call; see triage note.

## 2018-09-15 NOTE — Telephone Encounter (Signed)
He called in c/o feeling like his throat is closing [place]   "I'm constantly clearing my throat but nothing is there".    Denies difficulty with breathing or swallowing.   "I think it's allergies".     I made him an appt with Jodi Mourning, FNP for 09/16/18 at 10:40 with instructions to go to the ED if his symptoms become worse.   He verbalized understanding and was agreeable with this plan.  See triage notes below. Reason for Disposition . [1] Exposure to family member (or spouse or boyfriend/girlfriend) with test-proven strep AND [2] within last 10 days    No sore throat.   He is c/o feeling like his throat is closing and he constantly is clearing his throat especially [place].  I think it's allergies.  Answer Assessment - Initial Assessment Questions 1. ONSET: "When did the throat start hurting?" (Hours or days ago)      My throat feels like it's closing shut.   Denies shortness of breath, difficulty swallowing, talking clearly.     I feel like my throat is closing shut [place].  I think I'm allergic to something.   I took some Allergra this morning.      I had a allergy test that showed some sensitivities.   No allergy shots.    2. SEVERITY: "How bad is the sore throat?" (Scale 1-10; mild, moderate or severe)   - MILD (1-3):  doesn't interfere with eating or normal activities   - MODERATE (4-7): interferes with eating some solids and normal activities   - SEVERE (8-10):  excruciating pain, interferes with most normal activities   - SEVERE DYSPHAGIA: can't swallow liquids, drooling     Mild 3. STREP EXPOSURE: "Has there been any exposure to strep within the past week?" If so, ask: "What type of contact occurred?"      Do not have a sore throat. 4.  VIRAL SYMPTOMS: "Are there any symptoms of a cold, such as a runny nose, cough, hoarse voice or red eyes?"      No URI symptoms 5. FEVER: "Do you have a fever?" If so, ask:  "What is your temperature, how was it measured, and when did it start?"     No 6. PUS ON THE TONSILS: "Is there pus on the tonsils in the back of your throat?"     Tonsils removed 7. OTHER SYMPTOMS: "Do you have any other symptoms?" (e.g., difficulty breathing, headache, rash)     [place] and it feels like it's closing it passes after a while.    I constantly feel like I have to clear my throat. 8. PREGNANCY: "Is there any chance you are pregnant?" "When was your last menstrual period?"     N/A  Protocols used: SORE THROAT-A-AH

## 2018-09-15 NOTE — Telephone Encounter (Signed)
Message   [facility] [name] will be contacting pt to triage. See Triage Telephone encounter for documentation.    ----- Message -----  From: Erik Lolling Sr.  Sent: 09/15/2018 11:15 AM EST  To: [facility] Pool  Subject: Appointment scheduled from [place] For: Erik Lolling Sr. ([id number])  Visit Type: MYCHART OFFICE VISIT (1064)    10/02/2018  3:20 PM 20 mins. [name], MD     [id number]    Patient Comments:  I am episodes of my throat trying to close up(allergy?)   Attempted to call pt. Back at [phone] (H), and [phone] (M).  Unable to reach pt.  Left voice message to return call to office to speak to a triage nurse ASAP.

## 2018-09-16 ENCOUNTER — Encounter: Payer: Self-pay | Admitting: Family

## 2018-09-16 ENCOUNTER — Ambulatory Visit (INDEPENDENT_AMBULATORY_CARE_PROVIDER_SITE_OTHER): Payer: Medicare Other | Admitting: Family

## 2018-09-16 VITALS — BP 114/70 | HR 83 | Temp 98.2°F | Ht 66.25 in | Wt 169.6 lb

## 2018-09-16 DIAGNOSIS — N3010 Interstitial cystitis (chronic) without hematuria: Secondary | ICD-10-CM | POA: Diagnosis not present

## 2018-09-16 DIAGNOSIS — J309 Allergic rhinitis, unspecified: Secondary | ICD-10-CM

## 2018-09-16 DIAGNOSIS — M6281 Muscle weakness (generalized): Secondary | ICD-10-CM | POA: Diagnosis not present

## 2018-09-16 DIAGNOSIS — M62838 Other muscle spasm: Secondary | ICD-10-CM | POA: Diagnosis not present

## 2018-09-16 DIAGNOSIS — R0982 Postnasal drip: Secondary | ICD-10-CM | POA: Diagnosis not present

## 2018-09-16 NOTE — Progress Notes (Signed)
Erik Lolling Sr. is a 72 y.o. male with the following history as recorded in EpicCare:  Patient Active Problem List   Diagnosis Date Noted  . Bilateral leg pain 02/25/2018  . Pelvic floor dysfunction 11/29/2017  . Vitamin D deficiency 11/29/2017  . Elevated PSA 10/09/2017  . Reactive hypoglycemia 10/09/2017  . Degenerative arthritis of knee, bilateral 10/09/2017  . Upper respiratory tract infection 08/17/2017  . Family history of osteoporosis 04/02/2017  . AKI (acute kidney injury) ([place]) 04/02/2017  . Senile purpura ([place]) 10/14/2016  . Medication intolerances 06/26/2016  . Dyspepsia 06/26/2016  . Hyperglycemia 03/30/2016  . BPH (benign prostatic hypertrophy) with urinary obstruction 03/30/2016  . Abnormal CT of the abdomen 11/10/2015  . Right leg pain 09/01/2015  . Left shoulder pain 09/01/2015  . Fatigue 06/28/2015  . Bladder pain 03/25/2015  . Polyarthralgia 02/02/2015  . Abdominal pain, epigastric 02/01/2015  . Bloating 02/01/2015  . Bradycardia with 31 - 40 beats per minute 06/18/2014  . Chest pain 03/16/2013  . Hyperlipidemia 02/18/2013  . Helicobacter pylori (H. pylori) infection 11/25/2012  . 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 Preventative health care 02/23/2012  . Allergic rhinitis   . Rheumatoid arthritis ([place])   . Insomnia   . Anxiety   . CAD (coronary artery disease)   . Diverticulitis   . GERD with stricture   . IBS (irritable bowel syndrome)   . Helicobacter pylori gastritis   . Depression   . Internal and external hemorrhoids without complication   . Irritable bowel syndrome 06/09/2010  . Interstitial cystitis 06/09/2010  . FIBROMYALGIA 06/09/2010  . FLATULENCE-GAS-BLOATING 06/09/2010    Current Outpatient Medications  Medication Sig Dispense Refill  . citalopram (CELEXA) 10 MG tablet Take 1 tablet (10 mg total) by mouth daily. 90 tablet 3  . famotidine (PEPCID) 40 MG tablet Take 1 tablet (40 mg total) by mouth 2 (two) times daily. 60 tablet 4  .  fexofenadine (ALLEGRA) 180 MG tablet Take 180 mg by mouth daily. As needed    . fluticasone (FLONASE) 50 MCG/ACT nasal spray Place 2 sprays into both nostrils daily.    . hydrOXYzine (ATARAX/VISTARIL) 25 MG tablet Take 25 mg by mouth 3 (three) times daily as needed. 1/2 tablet qhs prn    . LORazepam (ATIVAN) 0.5 MG tablet TAKE 1 TABLET BY MOUTH TWICE DAILY AS NEEDED FOR ANXIETY 60 tablet 1   No current facility-administered medications for this visit.     Allergies: Ambien [zolpidem]; Lexapro [escitalopram oxalate]; Pneumovax [pneumococcal polysaccharide vaccine]; Prilosec [omeprazole]; Remeron [mirtazapine]; Statins; and Tramadol  Past Medical History:  Diagnosis Date  . Allergic rhinitis   . Allergy   . Anal fissure   . Anxiety   . CAD (coronary artery disease)   . Cataract    starting  . Coronary atherosclerosis of native coronary artery 2007   nonobstructive CAD by cath with 40% mid-LAD stenosis  . Degenerative arthritis of knee, bilateral 10/09/2017  . Depression   . Diverticulitis   . Diverticulosis 12/18/2011  . FIBROMYALGIA 06/09/2010   Qualifier: Diagnosis of  By: Carlean Purl MD, Dimas Millin Fibromyalgia   . GERD with stricture   . Helicobacter pylori (H. pylori) infection 11/25/2012   11/2012 EGD + gastric bxs  . Hiatal hernia   . HLD (hyperlipidemia)   . HTN (hypertension)   . Hyperlipidemia   . IBS (irritable bowel syndrome)   . Impotence of organic origin   . Insomnia   . Internal and  external hemorrhoids without complication   . Interstitial cystitis   . INTERSTITIAL CYSTITIS 06/09/2010   Qualifier: Diagnosis of  By: Carlean Purl MD, Dimas Millin Irritable bowel syndrome 06/09/2010   Qualifier: Diagnosis of  By: Carlean Purl MD, Tonna Boehringer E   . Pelvic floor dysfunction 11/29/2017  . Reactive hypoglycemia   . Rheumatoid arthritis(714.0)   . 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 Vitamin D deficiency 11/29/2017    Past Surgical History:  Procedure Laterality Date  .  bladder distention     x 6  . CATHETER REMOVAL     super pubic area  . COLONOSCOPY  05/10/2006   diverticulosis, internal and external hemorrhoids  . COLONOSCOPY    . INTERSTIM IMPLANT PLACEMENT    . INTERSTIM IMPLANT REMOVAL    . KNEE ARTHROSCOPY     right x 2  . NISSEN FUNDOPLICATION  3086  . SHOULDER ARTHROSCOPY  2010   left  . TONSILLECTOMY AND ADENOIDECTOMY  1957  . TRANSURETHRAL RESECTION OF PROSTATE     x 2  . UPPER GASTROINTESTINAL ENDOSCOPY  05/13/2008   hiatal hernia  . VASECTOMY      Family History  Problem Relation Age of Onset  . Colon polyps Father   . Diabetes Father   . Heart disease Father   . Hypertension Father   . Heart disease Mother   . Hypertension Mother   . Stroke Mother   . Breast cancer Sister   . Diabetes Sister   . Diabetes Paternal Uncle   . Colon cancer Neg Hx   . Esophageal cancer Neg Hx   . Rectal cancer Neg Hx   . Stomach cancer Neg Hx     Social History   Tobacco Use  . Smoking status: Former Research scientist (life sciences)  . Smokeless tobacco: Never Used  . Tobacco comment: quit 1980  Substance Use Topics  . Alcohol use: No    Alcohol/week: 0.0 standard drinks    Subjective:  Patient presents with concerns for "tightness in his throat"/ phlegm is lodged in his throat; notes that symptoms have been present x "years"; is suspicious that symptoms are related to [place] allerg[place]; states he has seen an allergist/ ENT but neither have found symptoms; was told to try allergy nasal steroid but no benefit; does not want to go back to see specialist; feels like these symptoms are somehow related to his IC affecting his immune system.    Objective:  Vitals:   09/16/18 1047  BP: 114/70  Pulse: 83  Temp: 98.2 F (36.8 C)  TempSrc: Oral  SpO2: 95%  Weight: 169 lb 9.6 oz (76.9 kg)  Height: 5' 6.25" (1.683 m)    General: Well developed, well nourished, in no acute distress  Skin : Warm and dry.  Head: Normocephalic and atraumatic   Eyes: Sclera and conjunctiva clear; pupils round and reactive to light; extraocular movements intact  Ears: External normal; canals clear; tympanic membranes normal  Oropharynx: Pink, supple. No suspicious lesions  Neck: Supple without thyromegaly, adenopathy  Lungs: Respirations unlabored; clear to auscultation bilaterally without wheeze, rales, rhonchi  CVS exam: normal rate and regular rhythm.  Neurologic: Alert and oriented; speech intact; face symmetrical; moves all extremities well; CNII-XII intact without focal deficit   Assessment:  1. Post-nasal drainage   2. Allergic rhinitis, unspecified seasonality, unspecified trigger     Plan:  Not a new issue; symptoms have been present for years; discussed seeing an  allergist to consider allergy testing- he defers at this time but will talk to his PCP if he changes his mind; reviewed medication options with patient- he understands how to use his antihistamines ( H1 and H2) as well as Flonase; follow-up as needed.   No follow-ups on file.  No orders of the defined types were placed in this encounter.   Requested Prescriptions    No prescriptions requested or ordered in this encounter

## 2018-09-18 ENCOUNTER — Ambulatory Visit (INDEPENDENT_AMBULATORY_CARE_PROVIDER_SITE_OTHER): Payer: Medicare Other | Admitting: Psychiatry

## 2018-09-18 DIAGNOSIS — F324 Major depressive disorder, single episode, in partial remission: Secondary | ICD-10-CM | POA: Diagnosis not present

## 2018-09-24 DIAGNOSIS — M6281 Muscle weakness (generalized): Secondary | ICD-10-CM | POA: Diagnosis not present

## 2018-09-24 DIAGNOSIS — M62838 Other muscle spasm: Secondary | ICD-10-CM | POA: Diagnosis not present

## 2018-09-24 DIAGNOSIS — R350 Frequency of micturition: Secondary | ICD-10-CM | POA: Diagnosis not present

## 2018-09-24 DIAGNOSIS — N3010 Interstitial cystitis (chronic) without hematuria: Secondary | ICD-10-CM | POA: Diagnosis not present

## 2018-09-30 DIAGNOSIS — N3010 Interstitial cystitis (chronic) without hematuria: Secondary | ICD-10-CM | POA: Diagnosis not present

## 2018-09-30 DIAGNOSIS — M6281 Muscle weakness (generalized): Secondary | ICD-10-CM | POA: Diagnosis not present

## 2018-09-30 DIAGNOSIS — R350 Frequency of micturition: Secondary | ICD-10-CM | POA: Diagnosis not present

## 2018-09-30 DIAGNOSIS — M62838 Other muscle spasm: Secondary | ICD-10-CM | POA: Diagnosis not present

## 2018-10-02 ENCOUNTER — Ambulatory Visit: Payer: Medicare Other | Admitting: Internal Medicine

## 2018-10-08 DIAGNOSIS — R350 Frequency of micturition: Secondary | ICD-10-CM | POA: Diagnosis not present

## 2018-10-08 DIAGNOSIS — R3915 Urgency of urination: Secondary | ICD-10-CM | POA: Diagnosis not present

## 2018-10-10 ENCOUNTER — Other Ambulatory Visit: Payer: Self-pay | Admitting: Internal Medicine

## 2018-10-13 NOTE — Telephone Encounter (Signed)
Done erx 

## 2018-10-13 NOTE — Telephone Encounter (Signed)
   LOV:09/16/18 Mickel Baas NextOV:12/25/18  Last Filled/Quantity: 01/31/18

## 2018-10-17 ENCOUNTER — Ambulatory Visit: Payer: Self-pay | Admitting: Internal Medicine

## 2018-11-13 ENCOUNTER — Ambulatory Visit (INDEPENDENT_AMBULATORY_CARE_PROVIDER_SITE_OTHER): Payer: Medicare Other | Admitting: Psychiatry

## 2018-11-13 DIAGNOSIS — F324 Major depressive disorder, single episode, in partial remission: Secondary | ICD-10-CM

## 2018-11-13 DIAGNOSIS — R350 Frequency of micturition: Secondary | ICD-10-CM | POA: Diagnosis not present

## 2018-11-13 DIAGNOSIS — R3915 Urgency of urination: Secondary | ICD-10-CM | POA: Diagnosis not present

## 2018-11-13 DIAGNOSIS — N3010 Interstitial cystitis (chronic) without hematuria: Secondary | ICD-10-CM | POA: Diagnosis not present

## 2018-11-13 DIAGNOSIS — R300 Dysuria: Secondary | ICD-10-CM | POA: Diagnosis not present

## 2018-11-21 DIAGNOSIS — M1711 Unilateral primary osteoarthritis, right knee: Secondary | ICD-10-CM | POA: Diagnosis not present

## 2018-11-21 DIAGNOSIS — M25561 Pain in right knee: Secondary | ICD-10-CM | POA: Diagnosis not present

## 2018-12-04 ENCOUNTER — Ambulatory Visit (INDEPENDENT_AMBULATORY_CARE_PROVIDER_SITE_OTHER): Payer: Medicare Other | Admitting: Psychiatry

## 2018-12-04 DIAGNOSIS — F324 Major depressive disorder, single episode, in partial remission: Secondary | ICD-10-CM | POA: Diagnosis not present

## 2018-12-22 DIAGNOSIS — H40003 Preglaucoma, unspecified, bilateral: Secondary | ICD-10-CM | POA: Diagnosis not present

## 2018-12-22 DIAGNOSIS — H2513 Age-related nuclear cataract, bilateral: Secondary | ICD-10-CM | POA: Diagnosis not present

## 2018-12-25 ENCOUNTER — Encounter: Payer: Self-pay | Admitting: Internal Medicine

## 2018-12-25 ENCOUNTER — Ambulatory Visit (INDEPENDENT_AMBULATORY_CARE_PROVIDER_SITE_OTHER): Payer: Medicare Other | Admitting: Internal Medicine

## 2018-12-25 VITALS — BP 130/78 | HR 77 | Temp 98.1°F | Wt 175.0 lb

## 2018-12-25 DIAGNOSIS — R739 Hyperglycemia, unspecified: Secondary | ICD-10-CM

## 2018-12-25 DIAGNOSIS — F329 Major depressive disorder, single episode, unspecified: Secondary | ICD-10-CM | POA: Diagnosis not present

## 2018-12-25 DIAGNOSIS — E785 Hyperlipidemia, unspecified: Secondary | ICD-10-CM | POA: Diagnosis not present

## 2018-12-25 DIAGNOSIS — F32A Depression, unspecified: Secondary | ICD-10-CM

## 2018-12-25 NOTE — Assessment & Plan Note (Signed)
stable overall by history and exam, recent data reviewed with pt, and pt to continue medical treatment as before,  to f/u any worsening symptoms or concerns  

## 2018-12-25 NOTE — Progress Notes (Signed)
Subjective:    Patient ID: Erik Lolling Sr., male    DOB: Dec 03, 1945, 73 y.o.   MRN: [id number]  HPI  Here to f/u, due for ileoconduit with bladder/prostatectomy, concerned about post op pain management as he cannot take oral pain meds.  Due for urology next wk, and GI soon after, having surgury at [facility]. Denies urinary symptoms such as dysuria, frequency, urgency, flank pain, hematuria or n/v, fever, chills.  Pt denies chest pain, increased sob or doe, wheezing, orthopnea, PND, increased LE swelling, palpitations, dizziness or syncope. Pt denies new neurological symptoms such as new headache, or facial or extremity weakness or numbness  Denies worsening depressive symptoms, suicidal ideation, or panic; has ongoing anxiety, not increased recently, valium 10 mg daily as needed working ok.   Past Medical History:  Diagnosis Date  . Allergic rhinitis   . Allergy   . Anal fissure   . Anxiety   . CAD (coronary artery disease)   . Cataract    starting  . Coronary atherosclerosis of native coronary artery 2007   nonobstructive CAD by cath with 40% mid-LAD stenosis  . Degenerative arthritis of knee, bilateral 10/09/2017  . Depression   . Diverticulitis   . Diverticulosis 12/18/2011  . FIBROMYALGIA 06/09/2010   Qualifier: Diagnosis of  By: Carlean Purl MD, Dimas Millin Fibromyalgia   . GERD with stricture   . Helicobacter pylori (H. pylori) infection 11/25/2012   11/2012 EGD + gastric bxs  . Hiatal hernia   . HLD (hyperlipidemia)   . HTN (hypertension)   . Hyperlipidemia   . IBS (irritable bowel syndrome)   . Impotence of organic origin   . Insomnia   . Internal and external hemorrhoids without complication   . Interstitial cystitis   . INTERSTITIAL CYSTITIS 06/09/2010   Qualifier: Diagnosis of  By: Carlean Purl MD, Dimas Millin Irritable bowel syndrome 06/09/2010   Qualifier: Diagnosis of  By: Carlean Purl MD, Tonna Boehringer E   . Pelvic floor dysfunction 11/29/2017  . Reactive hypoglycemia   .  Rheumatoid arthritis(714.0)   . 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 Vitamin D deficiency 11/29/2017   Past Surgical History:  Procedure Laterality Date  . bladder distention     x 6  . CATHETER REMOVAL     super pubic area  . COLONOSCOPY  05/10/2006   diverticulosis, internal and external hemorrhoids  . COLONOSCOPY    . INTERSTIM IMPLANT PLACEMENT    . INTERSTIM IMPLANT REMOVAL    . KNEE ARTHROSCOPY     right x 2  . NISSEN FUNDOPLICATION  4008  . SHOULDER ARTHROSCOPY  2010   left  . TONSILLECTOMY AND ADENOIDECTOMY  1957  . TRANSURETHRAL RESECTION OF PROSTATE     x 2  . UPPER GASTROINTESTINAL ENDOSCOPY  05/13/2008   hiatal hernia  . VASECTOMY      reports that he has quit smoking. He has never used smokeless tobacco. He reports that he does not drink alcohol or use drugs. family history includes Breast cancer in his sister; Colon polyps in his father; Diabetes in his father, paternal uncle, and sister; Heart disease in his father and mother; Hypertension in his father and mother; Stroke in his mother. Allergies  Allergen Reactions  . Ambien [Zolpidem]   . Lexapro [Escitalopram Oxalate]   . Pneumovax [Pneumococcal Polysaccharide Vaccine] Other (See Comments)    pain  . Prilosec [Omeprazole] Other (See Comments)    Per patient joint pain with  PPI's  . Remeron [Mirtazapine]   . Statins Other (See Comments)    Joint pain  . Tramadol Other (See Comments)   Current Outpatient Medications on File Prior to Visit  Medication Sig Dispense Refill  . diazepam (VALIUM) 5 MG tablet Take 5 mg by mouth 2 (two) times daily.    . famotidine (PEPCID) 40 MG tablet Take 1 tablet (40 mg total) by mouth 2 (two) times daily. 60 tablet 4  . fluticasone (FLONASE) 50 MCG/ACT nasal spray Place 2 sprays into both nostrils daily.    . hydrOXYzine (ATARAX/VISTARIL) 25 MG tablet Take 25 mg by mouth 3 (three) times daily as needed. 1/2 tablet qhs prn    . traMADol (ULTRAM) 50 MG tablet TAKE 1 TABLET(50 MG)  BY MOUTH EVERY 6 HOURS AS NEEDED 120 tablet 0   No current facility-administered medications on file prior to visit.    Review of Systems  Constitutional: Negative for other unusual diaphoresis or sweats HENT: Negative for ear discharge or swelling Eyes: Negative for other worsening visual disturbances Respiratory: Negative for stridor or other swelling  Gastrointestinal: Negative for worsening distension or other blood Genitourinary: Negative for retention or other urinary change Musculoskeletal: Negative for other MSK pain or swelling Skin: Negative for color change or other new lesions Neurological: Negative for worsening tremors and other numbness  Psychiatric/Behavioral: Negative for worsening agitation or other fatigue All other system neg per pt    Objective:   Physical Exam BP 130/78 (BP Location: Left Arm, Patient Position: Sitting, Cuff Size: Normal)   Pulse 77   Temp 98.1 F (36.7 C) (Oral)   Wt 175 lb (79.4 kg)   SpO2 96%   BMI 28.03 kg/m  VS noted,  Constitutional: Pt appears in NAD HENT: Head: NCAT.  Right Ear: External ear normal.  Left Ear: External ear normal.  Eyes: . Pupils are equal, round, and reactive to light. Conjunctivae and EOM are normal Nose: without d/c or deformity Neck: Neck supple. Gross normal ROM Cardiovascular: Normal rate and regular rhythm.   Pulmonary/Chest: Effort normal and breath sounds without rales or wheezing.  Abd:  Soft, NT, ND, + BS, no organomegaly Neurological: Pt is alert. At baseline orientation, motor grossly intact Skin: Skin is warm. No rashes, other new lesions, no LE edema Psychiatric: Pt behavior is normal without agitation , mild nervous, mild dysphoric No other exam findings Lab Results  Component Value Date   WBC 8.7 06/24/2018   HGB 16.0 06/24/2018   HCT 45.7 06/24/2018   PLT 296.0 06/24/2018   GLUCOSE 95 06/24/2018   CHOL 229 (H) 06/24/2018   TRIG 333.0 (H) 06/24/2018   HDL 38.50 (L) 06/24/2018    LDLDIRECT 169.0 06/24/2018   LDLCALC 153 (H) 04/02/2017   ALT 27 06/24/2018   AST 43 (H) 06/24/2018   NA 137 06/24/2018   K 4.1 06/24/2018   CL 102 06/24/2018   CREATININE 1.23 06/24/2018   BUN 17 06/24/2018   CO2 29 06/24/2018   TSH 2.39 06/24/2018   PSA 0.95 06/24/2018   HGBA1C 6.1 06/24/2018       Assessment & Plan:

## 2018-12-25 NOTE — Patient Instructions (Signed)
Please continue all other medications as before, and refills have been done if requested.  Please have the pharmacy call with any other refills you may need.  Please continue your efforts at being more active, low cholesterol diet, and weight control.  Please keep your appointments with your specialists as you may have planned  Good Luck with your surgury!

## 2018-12-30 DIAGNOSIS — N3010 Interstitial cystitis (chronic) without hematuria: Secondary | ICD-10-CM | POA: Diagnosis not present

## 2019-01-01 ENCOUNTER — Ambulatory Visit: Payer: Medicare Other | Admitting: Psychiatry

## 2019-01-08 ENCOUNTER — Ambulatory Visit: Payer: Medicare Other | Admitting: Internal Medicine

## 2019-01-08 ENCOUNTER — Ambulatory Visit: Payer: Medicare Other | Admitting: Psychiatry

## 2019-01-08 ENCOUNTER — Encounter: Payer: Self-pay | Admitting: Internal Medicine

## 2019-01-08 VITALS — BP 102/66 | HR 88 | Ht 67.0 in | Wt 175.5 lb

## 2019-01-08 DIAGNOSIS — N9489 Other specified conditions associated with female genital organs and menstrual cycle: Secondary | ICD-10-CM

## 2019-01-08 DIAGNOSIS — K594 Anal spasm: Secondary | ICD-10-CM

## 2019-01-08 DIAGNOSIS — K582 Mixed irritable bowel syndrome: Secondary | ICD-10-CM | POA: Diagnosis not present

## 2019-01-08 MED ORDER — AMBULATORY NON FORMULARY MEDICATION: 3 refills | Status: DC

## 2019-01-08 NOTE — Progress Notes (Signed)
Erik ZEITER Sr. 73 y.o. [birth year] [id number]  Assessment & Plan:   Encounter Diagnoses  Name Primary?  . Irritable bowel syndrome with both constipation and diarrhea Yes  . Anal sphincter spasm   . High-tone pelvic floor dysfunction    Will try diltiazem 2% cream per rectum/anus bid for anal spasm Ask [name] or other MD's involved with his surgery for advice and recommendations about pain Tx after cystectomy and ileal conduit. I do not have any good suggestions and do not think GI investigation needed  Await above surgery to see what effects that may have re: IBS-IC crosstalk and his sxs. S/p fundoplication so GI sxs may be exacerbated by that - gas-bloat. Given his multiple treatment failures and intolerances not much if anything else to offer him.Do not think ever tested for SIBO - would not do right now but keep in mind. I think he has been Tx w/ Xifaxan empirically in past.  Cc;[name], Erik Oris, MD Erik Bene, MD  Subjective:   Chief Complaint: lower abd pain, chest pains with narcotics HPI Rush Landmark is here to discuss pain treatment post-op after cystectomy and ileal conduit that will occur in early April. He says he gets sharp chest pains if he takes pain medications that have narcotics and maybe even when he takes Pepcid at times. Wants to know what can be done to manage his post-op pain and prevent this  Also had 12 BM's yesterday and still has lower abdominal buring - usually this stops after defecation but not this time. Says usu about 3 stools/day though.  He did go see Ileana Roup for pelvic PT last year - I never saw that note - it was scanned in - she found anal spasm. He was Tx with PT and also valium suppositories. Denies anal pain during defecation or at other times. Colonoscopy 2017 - sigmoid diverticulosis EGD 5361 s/p fundoplication, mild gastropathy Allergies  Allergen Reactions  . Ambien [Zolpidem]   . Lexapro [Escitalopram Oxalate]   . Pneumovax  [Pneumococcal Polysaccharide Vaccine] Other (See Comments)    pain  . Prilosec [Omeprazole] Other (See Comments)    Per patient joint pain with PPI's  . Remeron [Mirtazapine]   . Statins Other (See Comments)    Joint pain  . Tramadol Other (See Comments)   Current Meds  Medication Sig  . cyclobenzaprine (FLEXERIL) 5 MG tablet Take 5 mg by mouth 2 (two) times daily.  . diazepam (DIASTAT ACUDIAL) 10 MG GEL Place 5 mg rectally at bedtime.  . famotidine (PEPCID) 40 MG tablet Take 1 tablet (40 mg total) by mouth 2 (two) times daily.  . hydrOXYzine (ATARAX/VISTARIL) 25 MG tablet Take 25 mg by mouth 3 (three) times daily as needed. 1/2 tablet qhs prn  . traMADol (ULTRAM) 50 MG tablet TAKE 1 TABLET(50 MG) BY MOUTH EVERY 6 HOURS AS NEEDED   Past Medical History:  Diagnosis Date  . Allergic rhinitis   . Allergy   . Anal fissure   . Anxiety   . CAD (coronary artery disease)   . Cataract    starting  . Coronary atherosclerosis of native coronary artery 2007   nonobstructive CAD by cath with 40% mid-LAD stenosis  . Degenerative arthritis of knee, bilateral 10/09/2017  . Depression   . Diverticulitis   . Diverticulosis 12/18/2011  . FIBROMYALGIA 06/09/2010   Qualifier: Diagnosis of  By: Carlean Purl MD, Dimas Millin Fibromyalgia   . GERD with stricture   .  Helicobacter pylori (H. pylori) infection 11/25/2012   11/2012 EGD + gastric bxs  . Hiatal hernia   . HLD (hyperlipidemia)   . HTN (hypertension)   . Hyperlipidemia   . IBS (irritable bowel syndrome)   . Impotence of organic origin   . Insomnia   . Internal and external hemorrhoids without complication   . Interstitial cystitis   . INTERSTITIAL CYSTITIS 06/09/2010   Qualifier: Diagnosis of  By: Carlean Purl MD, Dimas Millin Irritable bowel syndrome 06/09/2010   Qualifier: Diagnosis of  By: Carlean Purl MD, Tonna Boehringer E   . Pelvic floor dysfunction 11/29/2017  . Reactive hypoglycemia   . Rheumatoid arthritis(714.0)   . Somatization disorder  02/27/2012  . Vitamin D deficiency 11/29/2017   Past Surgical History:  Procedure Laterality Date  . bladder distention     x 6  . CATHETER REMOVAL     super pubic area  . COLONOSCOPY  05/10/2006   diverticulosis, internal and external hemorrhoids  . COLONOSCOPY    . DENTAL SURGERY Right    #30 tooth extraction (right upper)  . INTERSTIM IMPLANT PLACEMENT    . INTERSTIM IMPLANT REMOVAL    . KNEE ARTHROSCOPY     right x 2  . NISSEN FUNDOPLICATION  2683  . SHOULDER ARTHROSCOPY  2010   left  . TONSILLECTOMY AND ADENOIDECTOMY  1957  . TRANSURETHRAL RESECTION OF PROSTATE     x 2  . UPPER GASTROINTESTINAL ENDOSCOPY  05/13/2008   hiatal hernia  . 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   Review of Systems As above  Objective:   Physical Exam BP 102/66   Pulse 88   Ht 5\' 7"  (1.702 m)   Wt 175 lb 8 oz (79.6 kg)   BMI 27.49 kg/m  NAD abd soft and nontender Rectal - NL anoderm, mild anal spasm suspected, not tender, no mass, no stool  25 minutes time spent with patient > half in counseling coordination of care

## 2019-01-08 NOTE — Patient Instructions (Addendum)
  We have sent a prescription for Diltiazem cream to [facility]. You should apply a pea size amount to your rectum twice a day.  [facility]'s information is below: Address: [address] [place], [place] [address]  [phone]  *Please DO NOT go directly from our office to pick up this medication! Give the pharmacy 1 day to process the prescription as this is compounded at takes time to make.  Rx faxed to [facility] and also given to patient.   I appreciate the opportunity to care for you. Silvano Rusk, MD, [facility]

## 2019-01-13 ENCOUNTER — Ambulatory Visit (INDEPENDENT_AMBULATORY_CARE_PROVIDER_SITE_OTHER): Payer: Medicare Other | Admitting: Psychiatry

## 2019-01-13 DIAGNOSIS — F324 Major depressive disorder, single episode, in partial remission: Secondary | ICD-10-CM | POA: Diagnosis not present

## 2019-02-16 DIAGNOSIS — G478 Other sleep disorders: Secondary | ICD-10-CM | POA: Diagnosis not present

## 2019-02-16 DIAGNOSIS — R0683 Snoring: Secondary | ICD-10-CM | POA: Diagnosis not present

## 2019-02-16 DIAGNOSIS — R0989 Other specified symptoms and signs involving the circulatory and respiratory systems: Secondary | ICD-10-CM | POA: Diagnosis not present

## 2019-02-16 DIAGNOSIS — G4700 Insomnia, unspecified: Secondary | ICD-10-CM | POA: Diagnosis not present

## 2019-03-09 DIAGNOSIS — R3915 Urgency of urination: Secondary | ICD-10-CM | POA: Diagnosis not present

## 2019-03-09 DIAGNOSIS — R350 Frequency of micturition: Secondary | ICD-10-CM | POA: Diagnosis not present

## 2019-03-20 DIAGNOSIS — R350 Frequency of micturition: Secondary | ICD-10-CM | POA: Diagnosis not present

## 2019-03-20 DIAGNOSIS — R3915 Urgency of urination: Secondary | ICD-10-CM | POA: Diagnosis not present

## 2019-04-20 DIAGNOSIS — D165 Benign neoplasm of lower jaw bone: Secondary | ICD-10-CM | POA: Diagnosis not present

## 2019-05-11 DIAGNOSIS — R3915 Urgency of urination: Secondary | ICD-10-CM | POA: Diagnosis not present

## 2019-05-11 DIAGNOSIS — R350 Frequency of micturition: Secondary | ICD-10-CM | POA: Diagnosis not present

## 2019-05-13 DIAGNOSIS — R350 Frequency of micturition: Secondary | ICD-10-CM | POA: Diagnosis not present

## 2019-05-13 DIAGNOSIS — R3915 Urgency of urination: Secondary | ICD-10-CM | POA: Diagnosis not present

## 2019-05-14 DIAGNOSIS — K136 Irritative hyperplasia of oral mucosa: Secondary | ICD-10-CM | POA: Diagnosis not present

## 2019-05-14 DIAGNOSIS — D165 Benign neoplasm of lower jaw bone: Secondary | ICD-10-CM | POA: Diagnosis not present

## 2019-05-25 DIAGNOSIS — Z01812 Encounter for preprocedural laboratory examination: Secondary | ICD-10-CM | POA: Diagnosis not present

## 2019-05-25 DIAGNOSIS — Z1159 Encounter for screening for other viral diseases: Secondary | ICD-10-CM | POA: Diagnosis not present

## 2019-05-25 DIAGNOSIS — N3010 Interstitial cystitis (chronic) without hematuria: Secondary | ICD-10-CM | POA: Diagnosis not present

## 2019-05-28 ENCOUNTER — Ambulatory Visit (INDEPENDENT_AMBULATORY_CARE_PROVIDER_SITE_OTHER): Payer: Medicare Other | Admitting: Psychiatry

## 2019-05-28 DIAGNOSIS — F324 Major depressive disorder, single episode, in partial remission: Secondary | ICD-10-CM

## 2019-05-31 DIAGNOSIS — M069 Rheumatoid arthritis, unspecified: Secondary | ICD-10-CM | POA: Diagnosis not present

## 2019-05-31 DIAGNOSIS — D62 Acute posthemorrhagic anemia: Secondary | ICD-10-CM | POA: Diagnosis not present

## 2019-05-31 DIAGNOSIS — M797 Fibromyalgia: Secondary | ICD-10-CM | POA: Diagnosis not present

## 2019-05-31 DIAGNOSIS — G4700 Insomnia, unspecified: Secondary | ICD-10-CM | POA: Diagnosis not present

## 2019-05-31 DIAGNOSIS — N3010 Interstitial cystitis (chronic) without hematuria: Secondary | ICD-10-CM | POA: Diagnosis not present

## 2019-05-31 DIAGNOSIS — I2510 Atherosclerotic heart disease of native coronary artery without angina pectoris: Secondary | ICD-10-CM | POA: Diagnosis not present

## 2019-05-31 DIAGNOSIS — K219 Gastro-esophageal reflux disease without esophagitis: Secondary | ICD-10-CM | POA: Diagnosis not present

## 2019-05-31 DIAGNOSIS — Z01818 Encounter for other preprocedural examination: Secondary | ICD-10-CM | POA: Diagnosis not present

## 2019-05-31 DIAGNOSIS — Z01810 Encounter for preprocedural cardiovascular examination: Secondary | ICD-10-CM | POA: Diagnosis not present

## 2019-05-31 DIAGNOSIS — N138 Other obstructive and reflux uropathy: Secondary | ICD-10-CM | POA: Diagnosis not present

## 2019-05-31 DIAGNOSIS — K589 Irritable bowel syndrome without diarrhea: Secondary | ICD-10-CM | POA: Diagnosis not present

## 2019-05-31 DIAGNOSIS — R918 Other nonspecific abnormal finding of lung field: Secondary | ICD-10-CM | POA: Diagnosis not present

## 2019-05-31 DIAGNOSIS — E785 Hyperlipidemia, unspecified: Secondary | ICD-10-CM | POA: Diagnosis not present

## 2019-06-05 MED ORDER — STRI-DEX MAXIMUM STRENGTH 2 % EX PADS
125.00 | MEDICATED_PAD | CUTANEOUS | Status: DC
Start: ? — End: 2019-06-05

## 2019-06-05 MED ORDER — CARBOXYMETHYLCELLULOSE SODIUM 0.5 % OP SOLN
1.00 | OPHTHALMIC | Status: DC
Start: ? — End: 2019-06-05

## 2019-06-05 MED ORDER — Medication
5.00 | Status: DC
Start: ? — End: 2019-06-05

## 2019-06-05 MED ORDER — POLYETHYLENE GLYCOL 3350 17 G PO PACK
17.00 | PACK | ORAL | Status: DC
Start: 2019-06-05 — End: 2019-06-05

## 2019-06-05 MED ORDER — Medication
500.00 | Status: DC
Start: 2019-06-05 — End: 2019-06-05

## 2019-06-05 MED ORDER — VIDA MIA UNILET LANCETS 30G MISC
12.00 | Status: DC
Start: 2019-06-05 — End: 2019-06-05

## 2019-06-05 MED ORDER — CYCLOBENZAPRINE HCL 5 MG PO TABS
5.00 | ORAL_TABLET | ORAL | Status: DC
Start: 2019-06-05 — End: 2019-06-05

## 2019-06-05 MED ORDER — INTRON A 6000000 UNIT/ML IJ SOLN
1.00 | INTRAMUSCULAR | Status: DC
Start: ? — End: 2019-06-05

## 2019-06-05 MED ORDER — LACTATED RINGERS IV SOLN
INTRAVENOUS | Status: DC
Start: ? — End: 2019-06-05

## 2019-06-05 MED ORDER — FOSPHENYTOIN SODIUM 50 MG PE/ML IJ SOLN
15.00 | INTRAMUSCULAR | Status: DC
Start: ? — End: 2019-06-05

## 2019-06-05 MED ORDER — METHYLPHENIDATE HCL POWD
100.00 | Status: DC
Start: ? — End: 2019-06-05

## 2019-06-05 MED ORDER — HEPARIN SODIUM (PORCINE) 5000 UNIT/ML IJ SOLN
5000.00 | INTRAMUSCULAR | Status: DC
Start: 2019-06-05 — End: 2019-06-05

## 2019-06-05 MED ORDER — DICLOXACILLIN SODIUM 62.5 MG/5ML PO SUSR
10.00 | ORAL | Status: DC
Start: ? — End: 2019-06-05

## 2019-06-05 MED ORDER — DIAPHRAGM ARC-SPRING 90 MM VA KIT
2.00 | PACK | VAGINAL | Status: DC
Start: ? — End: 2019-06-05

## 2019-06-05 MED ORDER — MEDI-TUSSIN DM DOUBLE STRENGTH 30-200 MG/5ML PO LIQD
1000.00 | ORAL | Status: DC
Start: 2019-06-05 — End: 2019-06-05

## 2019-06-05 MED ORDER — MELATONIN 3 MG PO TABS
3.00 | ORAL_TABLET | ORAL | Status: DC
Start: 2019-06-05 — End: 2019-06-05

## 2019-06-05 MED ORDER — PRO HERBS ENERGY PO TABS
20.00 | ORAL_TABLET | ORAL | Status: DC
Start: 2019-06-05 — End: 2019-06-05

## 2019-06-05 MED ORDER — MEDI-TUSSIN DM DOUBLE STRENGTH 30-200 MG/5ML PO LIQD
1000.00 | ORAL | Status: DC
Start: ? — End: 2019-06-05

## 2019-06-05 MED ORDER — GLUCOSAMINE-CHONDROIT-COLLAGEN PO
100.00 | ORAL | Status: DC
Start: 2019-06-05 — End: 2019-06-05

## 2019-06-08 ENCOUNTER — Telehealth: Payer: Self-pay | Admitting: *Deleted

## 2019-06-08 NOTE — Telephone Encounter (Signed)
Rec'd notification on Patient [name] Portal stating on 05/31/19 @ 12:45 PM Pt was admitted to [facility] inpatient for Chronic interstitial cystitis. Pt underwent a PR CYSTECTOMY,ILEAL CONDUIT/SIGMOID BLADDERCYSTECTOMY W/ ILEAL CONDUIT DIVERSION L1 COLON BUNDL. On 06/05/19 @ 12:49 PM pt was discharged from [facility] inpatient to Home. Pt will follow-up w/surgeon on 06/15/19.Marland KitchenJohny Chess

## 2019-06-11 DIAGNOSIS — Z906 Acquired absence of other parts of urinary tract: Secondary | ICD-10-CM | POA: Insufficient documentation

## 2019-06-11 DIAGNOSIS — Z436 Encounter for attention to other artificial openings of urinary tract: Secondary | ICD-10-CM | POA: Diagnosis not present

## 2019-06-11 DIAGNOSIS — Z9889 Other specified postprocedural states: Secondary | ICD-10-CM | POA: Insufficient documentation

## 2019-06-11 DIAGNOSIS — Z87891 Personal history of nicotine dependence: Secondary | ICD-10-CM | POA: Diagnosis not present

## 2019-06-11 DIAGNOSIS — Z79899 Other long term (current) drug therapy: Secondary | ICD-10-CM | POA: Diagnosis not present

## 2019-06-11 DIAGNOSIS — N3010 Interstitial cystitis (chronic) without hematuria: Secondary | ICD-10-CM | POA: Diagnosis not present

## 2019-06-12 DIAGNOSIS — Z9079 Acquired absence of other genital organ(s): Secondary | ICD-10-CM | POA: Insufficient documentation

## 2019-06-12 DIAGNOSIS — Z87891 Personal history of nicotine dependence: Secondary | ICD-10-CM | POA: Diagnosis not present

## 2019-06-12 DIAGNOSIS — N3010 Interstitial cystitis (chronic) without hematuria: Secondary | ICD-10-CM | POA: Diagnosis not present

## 2019-06-12 DIAGNOSIS — Z79899 Other long term (current) drug therapy: Secondary | ICD-10-CM | POA: Diagnosis not present

## 2019-06-12 DIAGNOSIS — Z436 Encounter for attention to other artificial openings of urinary tract: Secondary | ICD-10-CM | POA: Diagnosis not present

## 2019-06-15 DIAGNOSIS — Z87891 Personal history of nicotine dependence: Secondary | ICD-10-CM | POA: Diagnosis not present

## 2019-06-15 DIAGNOSIS — Z79899 Other long term (current) drug therapy: Secondary | ICD-10-CM | POA: Diagnosis not present

## 2019-06-15 DIAGNOSIS — N3010 Interstitial cystitis (chronic) without hematuria: Secondary | ICD-10-CM | POA: Diagnosis not present

## 2019-06-15 DIAGNOSIS — Z436 Encounter for attention to other artificial openings of urinary tract: Secondary | ICD-10-CM | POA: Diagnosis not present

## 2019-06-16 DIAGNOSIS — Z79899 Other long term (current) drug therapy: Secondary | ICD-10-CM | POA: Diagnosis not present

## 2019-06-16 DIAGNOSIS — Z932 Ileostomy status: Secondary | ICD-10-CM | POA: Diagnosis not present

## 2019-06-16 DIAGNOSIS — N3010 Interstitial cystitis (chronic) without hematuria: Secondary | ICD-10-CM | POA: Diagnosis not present

## 2019-06-16 DIAGNOSIS — Z436 Encounter for attention to other artificial openings of urinary tract: Secondary | ICD-10-CM | POA: Diagnosis not present

## 2019-06-16 DIAGNOSIS — Z87891 Personal history of nicotine dependence: Secondary | ICD-10-CM | POA: Diagnosis not present

## 2019-06-17 DIAGNOSIS — Z87891 Personal history of nicotine dependence: Secondary | ICD-10-CM | POA: Diagnosis not present

## 2019-06-17 DIAGNOSIS — N3010 Interstitial cystitis (chronic) without hematuria: Secondary | ICD-10-CM | POA: Diagnosis not present

## 2019-06-17 DIAGNOSIS — Z436 Encounter for attention to other artificial openings of urinary tract: Secondary | ICD-10-CM | POA: Diagnosis not present

## 2019-06-17 DIAGNOSIS — Z79899 Other long term (current) drug therapy: Secondary | ICD-10-CM | POA: Diagnosis not present

## 2019-06-18 ENCOUNTER — Encounter: Payer: Self-pay | Admitting: Internal Medicine

## 2019-06-18 ENCOUNTER — Ambulatory Visit: Payer: Medicare Other | Admitting: Internal Medicine

## 2019-06-18 DIAGNOSIS — R972 Elevated prostate specific antigen [PSA]: Secondary | ICD-10-CM

## 2019-06-18 NOTE — Progress Notes (Signed)
Patient ID: Erik Lolling Sr., male   DOB: 07-Oct-1946, 73 y.o.   MRN: [id number]  Phone note  Pt no show by cell or home phone

## 2019-06-18 NOTE — Patient Instructions (Signed)
See notes

## 2019-06-19 ENCOUNTER — Inpatient Hospital Stay: Payer: Medicare Other | Admitting: Internal Medicine

## 2019-06-22 ENCOUNTER — Encounter: Payer: Self-pay | Admitting: Internal Medicine

## 2019-06-23 DIAGNOSIS — Z79899 Other long term (current) drug therapy: Secondary | ICD-10-CM | POA: Diagnosis not present

## 2019-06-23 DIAGNOSIS — N3010 Interstitial cystitis (chronic) without hematuria: Secondary | ICD-10-CM | POA: Diagnosis not present

## 2019-06-23 DIAGNOSIS — Z87891 Personal history of nicotine dependence: Secondary | ICD-10-CM | POA: Diagnosis not present

## 2019-06-23 DIAGNOSIS — Z436 Encounter for attention to other artificial openings of urinary tract: Secondary | ICD-10-CM | POA: Diagnosis not present

## 2019-06-24 DIAGNOSIS — Z436 Encounter for attention to other artificial openings of urinary tract: Secondary | ICD-10-CM | POA: Diagnosis not present

## 2019-06-24 DIAGNOSIS — Z79899 Other long term (current) drug therapy: Secondary | ICD-10-CM | POA: Diagnosis not present

## 2019-06-24 DIAGNOSIS — N3010 Interstitial cystitis (chronic) without hematuria: Secondary | ICD-10-CM | POA: Diagnosis not present

## 2019-06-24 DIAGNOSIS — Z87891 Personal history of nicotine dependence: Secondary | ICD-10-CM | POA: Diagnosis not present

## 2019-06-25 DIAGNOSIS — Z436 Encounter for attention to other artificial openings of urinary tract: Secondary | ICD-10-CM | POA: Diagnosis not present

## 2019-06-25 DIAGNOSIS — Z79899 Other long term (current) drug therapy: Secondary | ICD-10-CM | POA: Diagnosis not present

## 2019-06-25 DIAGNOSIS — N3010 Interstitial cystitis (chronic) without hematuria: Secondary | ICD-10-CM | POA: Diagnosis not present

## 2019-06-25 DIAGNOSIS — Z87891 Personal history of nicotine dependence: Secondary | ICD-10-CM | POA: Diagnosis not present

## 2019-06-30 ENCOUNTER — Ambulatory Visit (INDEPENDENT_AMBULATORY_CARE_PROVIDER_SITE_OTHER)
Admission: RE | Admit: 2019-06-30 | Discharge: 2019-06-30 | Disposition: A | Discharge status: Home / Self Care | Payer: Medicare Other | Source: Ambulatory Visit | Attending: Internal Medicine | Admitting: Internal Medicine

## 2019-06-30 ENCOUNTER — Other Ambulatory Visit (INDEPENDENT_AMBULATORY_CARE_PROVIDER_SITE_OTHER): Payer: Medicare Other

## 2019-06-30 ENCOUNTER — Ambulatory Visit (INDEPENDENT_AMBULATORY_CARE_PROVIDER_SITE_OTHER): Payer: Medicare Other | Admitting: Internal Medicine

## 2019-06-30 ENCOUNTER — Other Ambulatory Visit: Payer: Self-pay

## 2019-06-30 ENCOUNTER — Encounter: Payer: Self-pay | Admitting: Internal Medicine

## 2019-06-30 VITALS — BP 138/86 | HR 96 | Temp 97.9°F | Ht 67.0 in | Wt 160.0 lb

## 2019-06-30 DIAGNOSIS — R739 Hyperglycemia, unspecified: Secondary | ICD-10-CM

## 2019-06-30 DIAGNOSIS — E611 Iron deficiency: Secondary | ICD-10-CM | POA: Diagnosis not present

## 2019-06-30 DIAGNOSIS — Z0000 Encounter for general adult medical examination without abnormal findings: Secondary | ICD-10-CM

## 2019-06-30 DIAGNOSIS — R5383 Other fatigue: Secondary | ICD-10-CM | POA: Diagnosis not present

## 2019-06-30 DIAGNOSIS — E559 Vitamin D deficiency, unspecified: Secondary | ICD-10-CM

## 2019-06-30 DIAGNOSIS — E538 Deficiency of other specified B group vitamins: Secondary | ICD-10-CM | POA: Diagnosis not present

## 2019-06-30 DIAGNOSIS — R05 Cough: Secondary | ICD-10-CM | POA: Diagnosis not present

## 2019-06-30 LAB — CBC WITH DIFFERENTIAL/PLATELET
Basophils Absolute: 0.1 10*3/uL (ref 0.0–0.1)
Basophils Relative: 0.5 % (ref 0.0–3.0)
Eosinophils Absolute: 0.1 10*3/uL (ref 0.0–0.7)
Eosinophils Relative: 0.9 % (ref 0.0–5.0)
HCT: 35 % — ABNORMAL LOW (ref 39.0–52.0)
Hemoglobin: 11.6 g/dL — ABNORMAL LOW (ref 13.0–17.0)
Lymphocytes Relative: 20.3 % (ref 12.0–46.0)
Lymphs Abs: 2.4 10*3/uL (ref 0.7–4.0)
MCHC: 33.1 g/dL (ref 30.0–36.0)
MCV: 87 fl (ref 78.0–100.0)
Monocytes Absolute: 1 10*3/uL (ref 0.1–1.0)
Monocytes Relative: 8.8 % (ref 3.0–12.0)
Neutro Abs: 8.2 10*3/uL — ABNORMAL HIGH (ref 1.4–7.7)
Neutrophils Relative %: 69.5 % (ref 43.0–77.0)
Platelets: 535 10*3/uL — ABNORMAL HIGH (ref 150.0–400.0)
RBC: 4.02 Mil/uL — ABNORMAL LOW (ref 4.22–5.81)
RDW: 13.1 % (ref 11.5–15.5)
WBC: 11.8 10*3/uL — ABNORMAL HIGH (ref 4.0–10.5)

## 2019-06-30 LAB — URINALYSIS, ROUTINE W REFLEX MICROSCOPIC
Bilirubin Urine: NEGATIVE
Ketones, ur: NEGATIVE
Nitrite: POSITIVE — AB
Specific Gravity, Urine: 1.01 (ref 1.000–1.030)
Total Protein, Urine: NEGATIVE
Urine Glucose: NEGATIVE
Urobilinogen, UA: 0.2 (ref 0.0–1.0)
pH: 7 (ref 5.0–8.0)

## 2019-06-30 LAB — HEPATIC FUNCTION PANEL
ALT: 11 U/L (ref 0–53)
AST: 10 U/L (ref 0–37)
Albumin: 3.9 g/dL (ref 3.5–5.2)
Alkaline Phosphatase: 69 U/L (ref 39–117)
Bilirubin, Direct: 0.1 mg/dL (ref 0.0–0.3)
Total Bilirubin: 0.3 mg/dL (ref 0.2–1.2)
Total Protein: 7 g/dL (ref 6.0–8.3)

## 2019-06-30 LAB — LIPID PANEL
Cholesterol: 163 mg/dL (ref 0–200)
HDL: 37.2 mg/dL — ABNORMAL LOW (ref >39.00)
LDL Cholesterol: 100 mg/dL — ABNORMAL HIGH (ref 0–99)
NonHDL: 126.08
Total CHOL/HDL Ratio: 4
Triglycerides: 132 mg/dL (ref 0.0–149.0)
VLDL: 26.4 mg/dL (ref 0.0–40.0)

## 2019-06-30 LAB — VITAMIN D 25 HYDROXY (VIT D DEFICIENCY, FRACTURES): VITD: 37.23 ng/mL (ref 30.00–100.00)

## 2019-06-30 LAB — VITAMIN B12: Vitamin B-12: 292 pg/mL (ref 211–911)

## 2019-06-30 LAB — BASIC METABOLIC PANEL
BUN: 17 mg/dL (ref 6–23)
CO2: 25 mEq/L (ref 19–32)
Calcium: 9.2 mg/dL (ref 8.4–10.5)
Chloride: 102 mEq/L (ref 96–112)
Creatinine, Ser: 1.05 mg/dL (ref 0.40–1.50)
GFR: 69.37 mL/min (ref >60.00)
Glucose, Bld: 137 mg/dL — ABNORMAL HIGH (ref 70–99)
Potassium: 4.2 mEq/L (ref 3.5–5.1)
Sodium: 136 mEq/L (ref 135–145)

## 2019-06-30 LAB — PSA: PSA: 0 ng/mL — ABNORMAL LOW (ref 0.10–4.00)

## 2019-06-30 LAB — TSH: TSH: 1.35 u[IU]/mL (ref 0.35–4.50)

## 2019-06-30 LAB — IBC PANEL
Iron: 16 ug/dL — ABNORMAL LOW (ref 42–165)
Saturation Ratios: 5.6 % — ABNORMAL LOW (ref 20.0–50.0)
Transferrin: 205 mg/dL — ABNORMAL LOW (ref 212.0–360.0)

## 2019-06-30 LAB — HEMOGLOBIN A1C: Hgb A1c MFr Bld: 5.9 % (ref 4.6–6.5)

## 2019-06-30 NOTE — Assessment & Plan Note (Signed)
Most likely post surgical related, for labs and cxr as directed,  to f/u any worsening symptoms or concerns

## 2019-06-30 NOTE — Assessment & Plan Note (Signed)
For f/u vit d level

## 2019-06-30 NOTE — Patient Instructions (Signed)
Please continue all other medications as before, and refills have been done if requested.  Please have the pharmacy call with any other refills you may need.  Please continue your efforts at being more active, low cholesterol diet, and weight control.  You are otherwise up to date with prevention measures today.  Please keep your appointments with your specialists as you may have planned  Please go to the XRAY Department in the Basement (go straight as you get off the elevator) for the x-ray testing  Please go to the LAB in the Basement (turn left off the elevator) for the tests to be done today  You will be contacted by phone if any changes need to be made immediately.  Otherwise, you will receive a letter about your results with an explanation, but please check with MyChart first.  Please remember to sign up for MyChart if you have not done so, as this will be important to you in the future with finding out test results, communicating by private email, and scheduling acute appointments online when needed.  Please return in 6 months, or sooner if needed,

## 2019-06-30 NOTE — Assessment & Plan Note (Signed)

## 2019-06-30 NOTE — Progress Notes (Signed)
Subjective:    Patient ID: Erik Lolling Sr., male    DOB: Jun 10, 1946, 73 y.o.   MRN: [id number]  HPI  Here for wellness and f/u;  Overall doing ok;  Pt denies Chest pain, worsening SOB, DOE, wheezing, orthopnea, PND, worsening LE edema, palpitations, dizziness or syncope.  Pt denies neurological change such as new headache, facial or extremity weakness.  Pt denies polydipsia, polyuria, or low sugar symptoms. Pt states overall good compliance with treatment and medications, good tolerability, and has been trying to follow appropriate diet.  Pt denies worsening depressive symptoms, suicidal ideation or panic. No fever, night sweats, wt loss, loss of appetite, or other constitutional symptoms.  Pt states good ability with ADL's, has low fall risk, home safety reviewed and adequate, no other significant changes in hearing or vision, and not active with exercise, especially in the past month since his prostatectomy/cystectomy/ileal conduit surgury.  C/o severe fatigue since then, just has not bounced back yet.  No fever, and urine color no change. Past Medical History:  Diagnosis Date  . Allergic rhinitis   . Allergy   . Anal fissure   . Anxiety   . Cataract    starting  . Coronary atherosclerosis of native coronary artery 2007   nonobstructive CAD by cath with 40% mid-LAD stenosis  . Degenerative arthritis of knee, bilateral 10/09/2017  . Depression   . Diverticulosis 12/18/2011  . Fibromyalgia   . GERD with stricture   . Helicobacter pylori (H. pylori) infection 11/25/2012   11/2012 EGD + gastric bxs  . Hiatal hernia   . HLD (hyperlipidemia)   . HTN (hypertension)   . Hyperlipidemia   . IBS (irritable bowel syndrome)   . Impotence of organic origin   . Insomnia   . Internal and external hemorrhoids without complication   . Interstitial cystitis   . Irritable bowel syndrome 06/09/2010   Qualifier: Diagnosis of  By: Carlean Purl MD, Tonna Boehringer E   . Pelvic floor dysfunction 11/29/2017  .  Reactive hypoglycemia   . Rheumatoid arthritis(714.0)   . 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 Vitamin D deficiency 11/29/2017   Past Surgical History:  Procedure Laterality Date  . bladder distention     x 6  . CATHETER REMOVAL     super pubic area  . COLONOSCOPY    . DENTAL SURGERY Right    #30 tooth extraction (right upper)  . INTERSTIM IMPLANT PLACEMENT    . INTERSTIM IMPLANT REMOVAL    . KNEE ARTHROSCOPY     right x 2  . NISSEN FUNDOPLICATION  8338  . SHOULDER ARTHROSCOPY  2010   left  . TONSILLECTOMY AND ADENOIDECTOMY  1957  . TRANSURETHRAL RESECTION OF PROSTATE     x 2  . UPPER GASTROINTESTINAL ENDOSCOPY  05/13/2008   hiatal hernia  . VASECTOMY      reports that he has quit smoking. He has never used smokeless tobacco. He reports that he does not drink alcohol or use drugs. family history includes Breast cancer in his sister; Colon polyps in his father; Diabetes in his father, paternal uncle, and sister; Heart disease in his father and mother; Hypertension in his father and mother; Stroke in his mother. Allergies  Allergen Reactions  . Ambien [Zolpidem]   . Lexapro [Escitalopram Oxalate]   . Pneumovax [Pneumococcal Polysaccharide Vaccine] Other (See Comments)    pain  . Prilosec [Omeprazole] Other (See Comments)    Per patient joint pain with PPI's  . Remeron [Mirtazapine]   .  Statins Other (See Comments)    Joint pain  . Tramadol Other (See Comments)   Current Outpatient Medications on File Prior to Visit  Medication Sig Dispense Refill  . AMBULATORY NON FORMULARY MEDICATION Diltiazem 2% cream Apply a pea size amount to her rectum twice a day 30 g 3  . cyclobenzaprine (FLEXERIL) 5 MG tablet Take 5 mg by mouth 2 (two) times daily.    . diazepam (DIASTAT ACUDIAL) 10 MG GEL Place 5 mg rectally at bedtime.    . famotidine (PEPCID) 40 MG tablet Take 1 tablet (40 mg total) by mouth 2 (two) times daily. 60 tablet 4  . hydrOXYzine (ATARAX/VISTARIL) 25 MG tablet Take 25 mg  by mouth 3 (three) times daily as needed. 1/2 tablet qhs prn    . traMADol (ULTRAM) 50 MG tablet TAKE 1 TABLET(50 MG) BY MOUTH EVERY 6 HOURS AS NEEDED 120 tablet 0   No current facility-administered medications on file prior to visit.    Review of Systems Constitutional: Negative for other unusual diaphoresis, sweats, appetite or weight changes HENT: Negative for other worsening hearing loss, ear pain, facial swelling, mouth sores or neck stiffness.   Eyes: Negative for other worsening pain, redness or other visual disturbance.  Respiratory: Negative for other stridor or swelling Cardiovascular: Negative for other palpitations or other chest pain  Gastrointestinal: Negative for worsening diarrhea or loose stools, blood in stool, distention or other pain Genitourinary: Negative for hematuria, flank pain or other change in urine volume.  Musculoskeletal: Negative for myalgias or other joint swelling.  Skin: Negative for other color change, or other wound or worsening drainage.  Neurological: Negative for other syncope or numbness. Hematological: Negative for other adenopathy or swelling Psychiatric/Behavioral: Negative for hallucinations, other worsening agitation, SI, self-injury, or new decreased concentration All other system neg per pt    Objective:   Physical Exam BP 138/86   Pulse 96   Temp 97.9 F (36.6 C) (Oral)   Ht 5\' 7"  (1.702 m)   Wt 160 lb (72.6 kg)   SpO2 99%   BMI 25.06 kg/m  VS noted, has generalized weakness, prefers to lie on exam table during hx Constitutional: Pt is oriented to person, place, and time. Appears well-developed and well-nourished, in no significant distress and comfortable Head: Normocephalic and atraumatic  Eyes: Conjunctivae and EOM are normal. Pupils are equal, round, and reactive to light Right Ear: External ear normal without discharge Left Ear: External ear normal without discharge Nose: Nose without discharge or deformity Mouth/Throat:  Oropharynx is without other ulcerations and moist  Neck: Normal range of motion. Neck supple. No JVD present. No tracheal deviation present or significant neck LA or mass Cardiovascular: Normal rate, regular rhythm, normal heart sounds and intact distal pulses.   Pulmonary/Chest: WOB normal and breath sounds without rales or wheezing  Abdominal: Soft. Bowel sounds are normal. No HSM except for mild tender RLQ without guarding or rebound Musculoskeletal: Normal range of motion. Exhibits no edema Lymphadenopathy: Has no other cervical adenopathy.  Neurological: Pt is alert and oriented to person, place, and time. Pt has normal reflexes. No cranial nerve deficit. Motor grossly intact, Gait intact Skin: Skin is warm and dry. No rash noted or new ulcerations Psychiatric:  Has flat mood and affect. Behavior is normal without agitation No other exam findings Lab Results  Component Value Date   WBC 8.7 06/24/2018   HGB 16.0 06/24/2018   HCT 45.7 06/24/2018   PLT 296.0 06/24/2018   GLUCOSE 95 06/24/2018  CHOL 229 (H) 06/24/2018   TRIG 333.0 (H) 06/24/2018   HDL 38.50 (L) 06/24/2018   LDLDIRECT 169.0 06/24/2018   LDLCALC 153 (H) 04/02/2017   ALT 27 06/24/2018   AST 43 (H) 06/24/2018   NA 137 06/24/2018   K 4.1 06/24/2018   CL 102 06/24/2018   CREATININE 1.23 06/24/2018   BUN 17 06/24/2018   CO2 29 06/24/2018   TSH 2.39 06/24/2018   PSA 0.95 06/24/2018   HGBA1C 6.1 06/24/2018       Assessment & Plan:

## 2019-06-30 NOTE — Addendum Note (Signed)
Addended by: [name] on: 06/30/2019 01:35 PM   Modules accepted: Orders

## 2019-06-30 NOTE — Assessment & Plan Note (Signed)
stable overall by history and exam, recent data reviewed with pt, and pt to continue medical treatment as before,  to f/u any worsening symptoms or concerns  

## 2019-07-01 DIAGNOSIS — N3010 Interstitial cystitis (chronic) without hematuria: Secondary | ICD-10-CM | POA: Diagnosis not present

## 2019-07-01 DIAGNOSIS — Z79899 Other long term (current) drug therapy: Secondary | ICD-10-CM | POA: Diagnosis not present

## 2019-07-01 DIAGNOSIS — Z87891 Personal history of nicotine dependence: Secondary | ICD-10-CM | POA: Diagnosis not present

## 2019-07-01 DIAGNOSIS — Z436 Encounter for attention to other artificial openings of urinary tract: Secondary | ICD-10-CM | POA: Diagnosis not present

## 2019-07-07 DIAGNOSIS — Z932 Ileostomy status: Secondary | ICD-10-CM | POA: Diagnosis not present

## 2019-07-22 DIAGNOSIS — Z932 Ileostomy status: Secondary | ICD-10-CM | POA: Diagnosis not present

## 2019-08-04 ENCOUNTER — Encounter: Payer: Self-pay | Admitting: Internal Medicine

## 2019-08-05 ENCOUNTER — Encounter: Payer: Self-pay | Admitting: Internal Medicine

## 2019-08-05 NOTE — Telephone Encounter (Signed)
Staff to call pt who is requesting OV

## 2019-08-06 ENCOUNTER — Ambulatory Visit (INDEPENDENT_AMBULATORY_CARE_PROVIDER_SITE_OTHER): Payer: Medicare Other | Admitting: Internal Medicine

## 2019-08-06 ENCOUNTER — Other Ambulatory Visit: Payer: Self-pay

## 2019-08-06 ENCOUNTER — Encounter: Payer: Self-pay | Admitting: Internal Medicine

## 2019-08-06 VITALS — BP 142/86 | HR 89 | Temp 98.4°F | Ht 67.0 in | Wt 163.0 lb

## 2019-08-06 DIAGNOSIS — F329 Major depressive disorder, single episode, unspecified: Secondary | ICD-10-CM | POA: Diagnosis not present

## 2019-08-06 DIAGNOSIS — F32A Depression, unspecified: Secondary | ICD-10-CM

## 2019-08-06 DIAGNOSIS — R739 Hyperglycemia, unspecified: Secondary | ICD-10-CM | POA: Diagnosis not present

## 2019-08-06 DIAGNOSIS — T8189XA Other complications of procedures, not elsewhere classified, initial encounter: Secondary | ICD-10-CM

## 2019-08-06 NOTE — Progress Notes (Signed)
Subjective:    Patient ID: Erik Lolling Sr., male    DOB: 11-Jul-1946, 73 y.o.   MRN: [id number]  HPI  Here to f/u; overall doing ok,  Pt denies chest pain, increasing sob or doe, wheezing, orthopnea, PND, increased LE swelling, palpitations, dizziness or syncope.  Pt denies new neurological symptoms such as new headache, or facial or extremity weakness or numbness.  Pt denies polydipsia, polyuria, or low sugar episode.  Pt states overall good compliance with meds, mostly trying to follow appropriate diet, with wt overall stable, now s/p RLQ ostomy, with midline incision intact but at at near the most caudal aspect is a stitch with some skin protrusion, currently  Scabbed over, no bleeding.  Pl plans to see urology in f/u for this.  Denies worsening depressive symptoms, suicidal ideation, or panic; has ongoing anxiety Past Medical History:  Diagnosis Date  . Allergic rhinitis   . Allergy   . Anal fissure   . Anxiety   . Cataract    starting  . Coronary atherosclerosis of native coronary artery 2007   nonobstructive CAD by cath with 40% mid-LAD stenosis  . Degenerative arthritis of knee, bilateral 10/09/2017  . Depression   . Diverticulosis 12/18/2011  . Fibromyalgia   . GERD with stricture   . Helicobacter pylori (H. pylori) infection 11/25/2012   11/2012 EGD + gastric bxs  . Hiatal hernia   . HLD (hyperlipidemia)   . HTN (hypertension)   . Hyperlipidemia   . IBS (irritable bowel syndrome)   . Impotence of organic origin   . Insomnia   . Internal and external hemorrhoids without complication   . Interstitial cystitis   . Irritable bowel syndrome 06/09/2010   Qualifier: Diagnosis of  By: Carlean Purl MD, Tonna Boehringer E   . Pelvic floor dysfunction 11/29/2017  . Reactive hypoglycemia   . Rheumatoid arthritis(714.0)   . 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 Vitamin D deficiency 11/29/2017   Past Surgical History:  Procedure Laterality Date  . bladder distention     x 6  . CATHETER REMOVAL      super pubic area  . COLONOSCOPY    . DENTAL SURGERY Right    #30 tooth extraction (right upper)  . INTERSTIM IMPLANT PLACEMENT    . INTERSTIM IMPLANT REMOVAL    . KNEE ARTHROSCOPY     right x 2  . NISSEN FUNDOPLICATION  [id number]  . SHOULDER ARTHROSCOPY  2010   left  . TONSILLECTOMY AND ADENOIDECTOMY  1957  . TRANSURETHRAL RESECTION OF PROSTATE     x 2  . UPPER GASTROINTESTINAL ENDOSCOPY  05/13/2008   hiatal hernia  . VASECTOMY      reports that he has quit smoking. He has never used smokeless tobacco. He reports that he does not drink alcohol or use drugs. family history includes Breast cancer in his sister; Colon polyps in his father; Diabetes in his father, paternal uncle, and sister; Heart disease in his father and mother; Hypertension in his father and mother; Stroke in his mother. Allergies  Allergen Reactions  . Ambien [Zolpidem]   . Lexapro [Escitalopram Oxalate]   . Pneumovax [Pneumococcal Polysaccharide Vaccine] Other (See Comments)    pain  . Prilosec [Omeprazole] Other (See Comments)    Per patient joint pain with PPI's  . Remeron [Mirtazapine]   . Statins Other (See Comments)    Joint pain  . Tramadol Other (See Comments)   Current Outpatient Medications on File Prior to Visit  Medication  Sig Dispense Refill  . AMBULATORY NON FORMULARY MEDICATION Diltiazem 2% cream Apply a pea size amount to her rectum twice a day 30 g 3  . cyclobenzaprine (FLEXERIL) 5 MG tablet Take 5 mg by mouth 2 (two) times daily.    . diazepam (DIASTAT ACUDIAL) 10 MG GEL Place 5 mg rectally at bedtime.    . famotidine (PEPCID) 40 MG tablet Take 1 tablet (40 mg total) by mouth 2 (two) times daily. 60 tablet 4  . hydrOXYzine (ATARAX/VISTARIL) 25 MG tablet Take 25 mg by mouth 3 (three) times daily as needed. 1/2 tablet qhs prn    . traMADol (ULTRAM) 50 MG tablet TAKE 1 TABLET(50 MG) BY MOUTH EVERY 6 HOURS AS NEEDED 120 tablet 0   No current facility-administered medications on file prior to visit.     Review of Systems  Constitutional: Negative for other unusual diaphoresis or sweats HENT: Negative for ear discharge or swelling Eyes: Negative for other worsening visual disturbances Respiratory: Negative for stridor or other swelling  Gastrointestinal: Negative for worsening distension or other blood Genitourinary: Negative for retention or other urinary change Musculoskeletal: Negative for other MSK pain or swelling Skin: Negative for color change or other new lesions Neurological: Negative for worsening tremors and other numbness  Psychiatric/Behavioral: Negative for worsening agitation or other fatigue All otherwise neg per pt      Objective:   Physical Exam BP (!) 142/86   Pulse 89   Temp 98.4 F (36.9 C) (Oral)   Ht 5\' 7"  (1.702 m)   Wt 163 lb (73.9 kg)   SpO2 98%   BMI 25.53 kg/m  VS noted,  Constitutional: Pt appears in NAD HENT: Head: NCAT.  Right Ear: External ear normal.  Left Ear: External ear normal.  Eyes: . Pupils are equal, round, and reactive to light. Conjunctivae and EOM are normal Nose: without d/c or deformity Neck: Neck supple. Gross normal ROM Cardiovascular: Normal rate and regular rhythm.   Pulmonary/Chest: Effort normal and breath sounds without rales or wheezing.  Abd:  Soft, NT, ND, + BS, no organomegaly with intact midline scar and rlq ostomy pink with most caudal aspect of incision site with very small protruding stitch now scabbed over Neurological: Pt is alert. At baseline orientation, motor grossly intact Skin: Skin is warm. No rashes, other new lesions, no LE edema Psychiatric: Pt behavior is normal without agitation  All otherwise neg per pt Lab Results  Component Value Date   WBC 11.8 (H) 06/30/2019   HGB 11.6 (L) 06/30/2019   HCT 35.0 (L) 06/30/2019   PLT 535.0 (H) 06/30/2019   GLUCOSE 137 (H) 06/30/2019   CHOL 163 06/30/2019   TRIG 132.0 06/30/2019   HDL 37.20 (L) 06/30/2019   LDLDIRECT 169.0 06/24/2018   LDLCALC 100 (H)  06/30/2019   ALT 11 06/30/2019   AST 10 06/30/2019   NA 136 06/30/2019   K 4.2 06/30/2019   CL 102 06/30/2019   CREATININE 1.05 06/30/2019   BUN 17 06/30/2019   CO2 25 06/30/2019   TSH 1.35 06/30/2019   PSA 0.00 (L) 06/30/2019   HGBA1C 5.9 06/30/2019      Assessment & Plan:

## 2019-08-06 NOTE — Assessment & Plan Note (Signed)
To f/u urology as suggested, to f/u any worsening symptoms or concerns

## 2019-08-06 NOTE — Patient Instructions (Signed)
Please continue all other medications as before, and refills have been done if requested.  Please have the pharmacy call with any other refills you may need.  Please continue your efforts at being more active, low cholesterol diet, and weight control.  You are otherwise up to date with prevention measures today.  Please keep your appointments with your specialists as you may have planned - including follow up with Dr Amalia Hailey for the stitch issue

## 2019-08-06 NOTE — Assessment & Plan Note (Signed)
stable overall by history and exam, recent data reviewed with pt, and pt to continue medical treatment as before,  to f/u any worsening symptoms or concerns  

## 2019-08-07 DIAGNOSIS — Z906 Acquired absence of other parts of urinary tract: Secondary | ICD-10-CM | POA: Diagnosis not present

## 2019-08-07 DIAGNOSIS — M797 Fibromyalgia: Secondary | ICD-10-CM | POA: Diagnosis not present

## 2019-08-07 DIAGNOSIS — M6289 Other specified disorders of muscle: Secondary | ICD-10-CM | POA: Diagnosis not present

## 2019-08-24 DIAGNOSIS — Z932 Ileostomy status: Secondary | ICD-10-CM | POA: Diagnosis not present

## 2019-08-25 DIAGNOSIS — H2513 Age-related nuclear cataract, bilateral: Secondary | ICD-10-CM | POA: Diagnosis not present

## 2019-09-01 DIAGNOSIS — E161 Other hypoglycemia: Secondary | ICD-10-CM | POA: Diagnosis not present

## 2019-09-01 DIAGNOSIS — R197 Diarrhea, unspecified: Secondary | ICD-10-CM | POA: Diagnosis not present

## 2019-09-01 DIAGNOSIS — G4700 Insomnia, unspecified: Secondary | ICD-10-CM | POA: Diagnosis not present

## 2019-09-01 DIAGNOSIS — E7800 Pure hypercholesterolemia, unspecified: Secondary | ICD-10-CM | POA: Diagnosis not present

## 2019-09-15 DIAGNOSIS — N281 Cyst of kidney, acquired: Secondary | ICD-10-CM | POA: Diagnosis not present

## 2019-09-15 DIAGNOSIS — Z9889 Other specified postprocedural states: Secondary | ICD-10-CM | POA: Diagnosis not present

## 2019-09-15 DIAGNOSIS — Z906 Acquired absence of other parts of urinary tract: Secondary | ICD-10-CM | POA: Diagnosis not present

## 2019-09-15 DIAGNOSIS — N3010 Interstitial cystitis (chronic) without hematuria: Secondary | ICD-10-CM | POA: Diagnosis not present

## 2019-09-16 DIAGNOSIS — H1045 Other chronic allergic conjunctivitis: Secondary | ICD-10-CM | POA: Diagnosis not present

## 2019-09-16 DIAGNOSIS — H2513 Age-related nuclear cataract, bilateral: Secondary | ICD-10-CM | POA: Diagnosis not present

## 2019-09-22 DIAGNOSIS — G4700 Insomnia, unspecified: Secondary | ICD-10-CM | POA: Diagnosis not present

## 2019-09-22 DIAGNOSIS — R7303 Prediabetes: Secondary | ICD-10-CM | POA: Diagnosis not present

## 2019-09-22 DIAGNOSIS — E785 Hyperlipidemia, unspecified: Secondary | ICD-10-CM | POA: Diagnosis not present

## 2019-09-24 DIAGNOSIS — Z932 Ileostomy status: Secondary | ICD-10-CM | POA: Diagnosis not present

## 2019-09-29 DIAGNOSIS — Z906 Acquired absence of other parts of urinary tract: Secondary | ICD-10-CM | POA: Diagnosis not present

## 2019-09-29 DIAGNOSIS — Z9889 Other specified postprocedural states: Secondary | ICD-10-CM | POA: Diagnosis not present

## 2019-09-29 DIAGNOSIS — N3010 Interstitial cystitis (chronic) without hematuria: Secondary | ICD-10-CM | POA: Diagnosis not present

## 2019-10-07 DIAGNOSIS — M1711 Unilateral primary osteoarthritis, right knee: Secondary | ICD-10-CM | POA: Diagnosis not present

## 2019-10-07 DIAGNOSIS — M25561 Pain in right knee: Secondary | ICD-10-CM | POA: Diagnosis not present

## 2019-10-13 DIAGNOSIS — R110 Nausea: Secondary | ICD-10-CM | POA: Diagnosis not present

## 2019-10-16 DIAGNOSIS — Z9889 Other specified postprocedural states: Secondary | ICD-10-CM | POA: Diagnosis not present

## 2019-10-16 DIAGNOSIS — N3010 Interstitial cystitis (chronic) without hematuria: Secondary | ICD-10-CM | POA: Diagnosis not present

## 2019-10-16 DIAGNOSIS — Z906 Acquired absence of other parts of urinary tract: Secondary | ICD-10-CM | POA: Diagnosis not present

## 2019-10-22 DIAGNOSIS — G4700 Insomnia, unspecified: Secondary | ICD-10-CM | POA: Diagnosis not present

## 2019-10-22 DIAGNOSIS — Z139 Encounter for screening, unspecified: Secondary | ICD-10-CM | POA: Diagnosis not present

## 2019-10-22 DIAGNOSIS — G2581 Restless legs syndrome: Secondary | ICD-10-CM | POA: Diagnosis not present

## 2019-10-23 DIAGNOSIS — Z932 Ileostomy status: Secondary | ICD-10-CM | POA: Diagnosis not present

## 2019-10-26 DIAGNOSIS — Z9181 History of falling: Secondary | ICD-10-CM | POA: Diagnosis not present

## 2019-10-26 DIAGNOSIS — E785 Hyperlipidemia, unspecified: Secondary | ICD-10-CM | POA: Diagnosis not present

## 2019-10-26 DIAGNOSIS — Z0000 Encounter for general adult medical examination without abnormal findings: Secondary | ICD-10-CM | POA: Diagnosis not present

## 2019-10-27 ENCOUNTER — Ambulatory Visit (INDEPENDENT_AMBULATORY_CARE_PROVIDER_SITE_OTHER): Payer: Medicare Other | Admitting: Psychiatry

## 2019-10-27 DIAGNOSIS — F324 Major depressive disorder, single episode, in partial remission: Secondary | ICD-10-CM

## 2019-11-04 ENCOUNTER — Other Ambulatory Visit: Payer: Medicare Other

## 2019-11-04 ENCOUNTER — Other Ambulatory Visit: Payer: Self-pay

## 2019-11-04 DIAGNOSIS — R197 Diarrhea, unspecified: Secondary | ICD-10-CM

## 2019-11-04 DIAGNOSIS — K582 Mixed irritable bowel syndrome: Secondary | ICD-10-CM

## 2019-11-04 NOTE — Telephone Encounter (Signed)
Patient contacted by telephone.  He reports that at times the diarrhea is watery.  If he has a day with few stool he has a lot of cramping.  He will come this pm for c-diff testing.

## 2019-11-09 ENCOUNTER — Other Ambulatory Visit: Payer: Medicare Other

## 2019-11-09 DIAGNOSIS — R197 Diarrhea, unspecified: Secondary | ICD-10-CM

## 2019-11-10 LAB — CLOSTRIDIUM DIFFICILE TOXIN B, QUALITATIVE, REAL-TIME PCR: Toxigenic C. Difficile by PCR: NOT DETECTED

## 2019-11-11 MED ORDER — LORAZEPAM 0.5 MG PO TABS: 0.5000 mg | ORAL_TABLET | Freq: Every day | ORAL | 0 refills | Status: DC

## 2019-11-11 NOTE — Telephone Encounter (Signed)
C diff neg  Reports lorazepam has helped sxs described - spoke to him  Clarified he uses 0.5 mg tablet  rf x 30   Further refills from reg prescriber

## 2019-11-23 DIAGNOSIS — Z932 Ileostomy status: Secondary | ICD-10-CM | POA: Diagnosis not present

## 2019-11-27 DIAGNOSIS — Z936 Other artificial openings of urinary tract status: Secondary | ICD-10-CM | POA: Diagnosis not present

## 2019-11-27 DIAGNOSIS — Z436 Encounter for attention to other artificial openings of urinary tract: Secondary | ICD-10-CM | POA: Diagnosis not present

## 2019-11-27 DIAGNOSIS — N99538 Other complication of other stoma of urinary tract: Secondary | ICD-10-CM | POA: Diagnosis not present

## 2019-11-30 DIAGNOSIS — Z932 Ileostomy status: Secondary | ICD-10-CM | POA: Diagnosis not present

## 2019-12-01 ENCOUNTER — Ambulatory Visit (INDEPENDENT_AMBULATORY_CARE_PROVIDER_SITE_OTHER): Payer: Medicare Other | Admitting: Psychiatry

## 2019-12-01 DIAGNOSIS — F324 Major depressive disorder, single episode, in partial remission: Secondary | ICD-10-CM

## 2019-12-03 ENCOUNTER — Encounter: Payer: Self-pay | Admitting: Internal Medicine

## 2019-12-04 ENCOUNTER — Encounter: Payer: Self-pay | Admitting: Internal Medicine

## 2019-12-09 ENCOUNTER — Encounter: Payer: Self-pay | Admitting: Internal Medicine

## 2019-12-14 ENCOUNTER — Other Ambulatory Visit: Payer: Self-pay

## 2019-12-14 ENCOUNTER — Ambulatory Visit (INDEPENDENT_AMBULATORY_CARE_PROVIDER_SITE_OTHER): Payer: Medicare Other | Admitting: Internal Medicine

## 2019-12-14 ENCOUNTER — Encounter: Payer: Self-pay | Admitting: Internal Medicine

## 2019-12-14 VITALS — BP 136/80 | HR 71 | Temp 98.1°F | Ht 67.0 in | Wt 165.8 lb

## 2019-12-14 DIAGNOSIS — R739 Hyperglycemia, unspecified: Secondary | ICD-10-CM | POA: Diagnosis not present

## 2019-12-14 DIAGNOSIS — F32A Depression, unspecified: Secondary | ICD-10-CM

## 2019-12-14 DIAGNOSIS — F419 Anxiety disorder, unspecified: Secondary | ICD-10-CM | POA: Diagnosis not present

## 2019-12-14 DIAGNOSIS — F329 Major depressive disorder, single episode, unspecified: Secondary | ICD-10-CM | POA: Diagnosis not present

## 2019-12-14 DIAGNOSIS — R197 Diarrhea, unspecified: Secondary | ICD-10-CM | POA: Diagnosis not present

## 2019-12-14 MED ORDER — LORAZEPAM 0.5 MG PO TABS: 0.2500 mg | ORAL_TABLET | Freq: Two times a day (BID) | ORAL | 5 refills | Status: DC | PRN

## 2019-12-14 MED ORDER — DULOXETINE HCL 60 MG PO CPEP: 60.0000 mg | ORAL_CAPSULE | Freq: Every day | ORAL | 3 refills | Status: DC

## 2019-12-14 NOTE — Assessment & Plan Note (Signed)
For refill ativan bid prn,.declines referral

## 2019-12-14 NOTE — Patient Instructions (Signed)
Ok for ativan twice per day as needed  Ok to take the immodium as needed  Please take all new medication as prescribed - the cymbalta to help with stress and pain  Please continue all other medications as before, and refills have been done if requested.  Please have the pharmacy call with any other refills you may need.  Please continue your efforts at being more active, low cholesterol diet, and weight control.  Please keep your appointments with your specialists as you may have planned

## 2019-12-14 NOTE — Assessment & Plan Note (Addendum)
Benign exam, for immodium prn  I spent 40 minutes preparing to see the patient by review of recent labs, imaging and procedures, obtaining and reviewing separately obtained history, communicating with the patient and family or caregiver, ordering medications, tests or procedures, and documenting clinical information in the EHR including the differential Dx, treatment, and any further evaluation and other management of diarreha, anxiety, depression, hyperglycemia

## 2019-12-14 NOTE — Assessment & Plan Note (Signed)
For cymbalta asd,  to f/u any worsening symptoms or concerns  

## 2019-12-14 NOTE — Assessment & Plan Note (Signed)
stable overall by history and exam, recent data reviewed with pt, and pt to continue medical treatment as before,  to f/u any worsening symptoms or concerns  

## 2019-12-14 NOTE — Progress Notes (Signed)
Subjective:    Patient ID: Erik Lolling Sr., male    DOB: [birth year], 74 y.o.   MRN: [id number]  HPI  Here to f/u with a list - stress, irritable easily startled, abd bloating since surgury (no major complications), seeing counselor for PTSD, did have some ativan qhs for 30 days per GI for GI symptoms, bentyl did not work, seemed to not tolerate tramadol due to CP, but recently Pt denies chest pain, increased sob or doe, wheezing, orthopnea, PND, increased LE swelling, palpitations, dizziness or syncope.  Pt denies new neurological symptoms such as facial or extremity weakness or numbness  But has had right sided fleeting sharp but severe scalp pain.   Pt denies polydipsia, polyuria  Past Medical History:  Diagnosis Date  . Allergic rhinitis   . Allergy   . Anal fissure   . Anxiety   . Cataract    starting  . Coronary atherosclerosis of native coronary artery 2007   nonobstructive CAD by cath with 40% mid-LAD stenosis  . Degenerative arthritis of knee, bilateral 10/09/2017  . Depression   . Diverticulosis 12/18/2011  . Fibromyalgia   . GERD with stricture   . Helicobacter pylori (H. pylori) infection 11/25/2012   11/2012 EGD + gastric bxs  . Hiatal hernia   . HLD (hyperlipidemia)   . HTN (hypertension)   . Hyperlipidemia   . IBS (irritable bowel syndrome)   . Impotence of organic origin   . Insomnia   . Internal and external hemorrhoids without complication   . Interstitial cystitis   . Irritable bowel syndrome 06/09/2010   Qualifier: Diagnosis of  By: Carlean Purl MD, Tonna Boehringer E   . Pelvic floor dysfunction 11/29/2017  . Reactive hypoglycemia   . Rheumatoid arthritis(714.0)   . 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 Vitamin D deficiency 11/29/2017   Past Surgical History:  Procedure Laterality Date  . bladder distention     x 6  . CATHETER REMOVAL     super pubic area  . COLONOSCOPY    . DENTAL SURGERY Right    #30 tooth extraction (right upper)  . INTERSTIM IMPLANT PLACEMENT     . INTERSTIM IMPLANT REMOVAL    . KNEE ARTHROSCOPY     right x 2  . NISSEN FUNDOPLICATION  [id number]  . SHOULDER ARTHROSCOPY  2010   left  . TONSILLECTOMY AND ADENOIDECTOMY  1957  . TRANSURETHRAL RESECTION OF PROSTATE     x 2  . UPPER GASTROINTESTINAL ENDOSCOPY  05/13/2008   hiatal hernia  . VASECTOMY      reports that he has quit smoking. He has never used smokeless tobacco. He reports that he does not drink alcohol or use drugs. family history includes Breast cancer in his sister; Colon polyps in his father; Diabetes in his father, paternal uncle, and sister; Heart disease in his father and mother; Hypertension in his father and mother; Stroke in his mother. Allergies  Allergen Reactions  . Ambien [Zolpidem]   . Lexapro [Escitalopram Oxalate]   . Pneumovax [Pneumococcal Polysaccharide Vaccine] Other (See Comments)    pain  . Prilosec [Omeprazole] Other (See Comments)    Per patient joint pain with PPI's  . Remeron [Mirtazapine]   . Statins Other (See Comments)    Joint pain  . Tramadol Other (See Comments)   Current Outpatient Medications on File Prior to Visit  Medication Sig Dispense Refill  . traMADol (ULTRAM) 50 MG tablet TAKE 1 TABLET(50 MG) BY MOUTH EVERY 6 HOURS AS  NEEDED 120 tablet 0  . AMBULATORY NON FORMULARY MEDICATION Diltiazem 2% cream Apply a pea size amount to her rectum twice a day (Patient not taking: Reported on 12/14/2019) 30 g 3  . cyclobenzaprine (FLEXERIL) 5 MG tablet Take 5 mg by mouth 2 (two) times daily.    . diazepam (DIASTAT ACUDIAL) 10 MG GEL Place 5 mg rectally at bedtime.    . hydrOXYzine (ATARAX/VISTARIL) 25 MG tablet Take 25 mg by mouth 3 (three) times daily as needed. 1/2 tablet qhs prn     No current facility-administered medications on file prior to visit.   Review of Systems All otherwise neg per pt     Objective:   Physical Exam BP 136/80 (BP Location: Left Arm, Patient Position: Sitting, Cuff Size: Normal)   Pulse 71   Temp 98.1 F (36.7  C) (Oral)   Ht 5\' 7"  (1.702 m)   Wt 165 lb 12.8 oz (75.2 kg)   SpO2 98%   BMI 25.97 kg/m  VS noted,  Constitutional: Pt appears in NAD HENT: Head: NCAT.  Right Ear: External ear normal.  Left Ear: External ear normal.  Eyes: . Pupils are equal, round, and reactive to light. Conjunctivae and EOM are normal Nose: without d/c or deformity Neck: Neck supple. Gross normal ROM Cardiovascular: Normal rate and regular rhythm.   Pulmonary/Chest: Effort normal and breath sounds without rales or wheezing.  Abd:  Soft, NT, ND, + BS, no organomegaly Neurological: Pt is alert. At baseline orientation, motor grossly intact Skin: Skin is warm. No rashes, other new lesions, no LE edema Psychiatric: Pt behavior is normal without agitation but depressed nervous mood and affect All otherwise neg per pt Lab Results  Component Value Date   WBC 11.8 (H) 06/30/2019   HGB 11.6 (L) 06/30/2019   HCT 35.0 (L) 06/30/2019   PLT 535.0 (H) 06/30/2019   GLUCOSE 137 (H) 06/30/2019   CHOL 163 06/30/2019   TRIG 132.0 06/30/2019   HDL 37.20 (L) 06/30/2019   LDLDIRECT 169.0 06/24/2018   LDLCALC 100 (H) 06/30/2019   ALT 11 06/30/2019   AST 10 06/30/2019   NA 136 06/30/2019   K 4.2 06/30/2019   CL 102 06/30/2019   CREATININE 1.05 06/30/2019   BUN 17 06/30/2019   CO2 25 06/30/2019   TSH 1.35 06/30/2019   PSA 0.00 (L) 06/30/2019   HGBA1C 5.9 06/30/2019         Assessment & Plan:

## 2019-12-22 DIAGNOSIS — Z932 Ileostomy status: Secondary | ICD-10-CM | POA: Diagnosis not present

## 2019-12-25 ENCOUNTER — Other Ambulatory Visit: Payer: Self-pay | Admitting: Physician Assistant

## 2019-12-28 ENCOUNTER — Other Ambulatory Visit: Payer: Self-pay

## 2019-12-28 MED ORDER — FAMOTIDINE 40 MG PO TABS: 40.0000 mg | ORAL_TABLET | Freq: Two times a day (BID) | ORAL | 3 refills | Status: DC

## 2019-12-28 NOTE — Telephone Encounter (Signed)
Generic Pepcid refilled per Dr Celesta Aver instructions.

## 2019-12-29 ENCOUNTER — Encounter: Payer: Self-pay | Admitting: Internal Medicine

## 2019-12-31 ENCOUNTER — Ambulatory Visit: Payer: Medicare Other | Admitting: Internal Medicine

## 2020-01-07 ENCOUNTER — Ambulatory Visit (INDEPENDENT_AMBULATORY_CARE_PROVIDER_SITE_OTHER): Payer: Medicare Other | Admitting: Psychiatry

## 2020-01-07 DIAGNOSIS — F324 Major depressive disorder, single episode, in partial remission: Secondary | ICD-10-CM | POA: Diagnosis not present

## 2020-01-12 ENCOUNTER — Encounter: Payer: Self-pay | Admitting: Internal Medicine

## 2020-01-20 DIAGNOSIS — G4700 Insomnia, unspecified: Secondary | ICD-10-CM | POA: Diagnosis not present

## 2020-01-20 DIAGNOSIS — M1711 Unilateral primary osteoarthritis, right knee: Secondary | ICD-10-CM | POA: Diagnosis not present

## 2020-01-20 DIAGNOSIS — G2581 Restless legs syndrome: Secondary | ICD-10-CM | POA: Diagnosis not present

## 2020-01-22 DIAGNOSIS — Z932 Ileostomy status: Secondary | ICD-10-CM | POA: Diagnosis not present

## 2020-01-28 DIAGNOSIS — N3010 Interstitial cystitis (chronic) without hematuria: Secondary | ICD-10-CM | POA: Diagnosis not present

## 2020-01-28 DIAGNOSIS — Z906 Acquired absence of other parts of urinary tract: Secondary | ICD-10-CM | POA: Diagnosis not present

## 2020-01-28 DIAGNOSIS — N390 Urinary tract infection, site not specified: Secondary | ICD-10-CM | POA: Diagnosis not present

## 2020-01-28 DIAGNOSIS — R350 Frequency of micturition: Secondary | ICD-10-CM | POA: Diagnosis not present

## 2020-02-02 ENCOUNTER — Ambulatory Visit (INDEPENDENT_AMBULATORY_CARE_PROVIDER_SITE_OTHER): Payer: Medicare Other | Admitting: Psychiatry

## 2020-02-02 DIAGNOSIS — F324 Major depressive disorder, single episode, in partial remission: Secondary | ICD-10-CM

## 2020-02-11 DIAGNOSIS — J302 Other seasonal allergic rhinitis: Secondary | ICD-10-CM | POA: Diagnosis not present

## 2020-02-16 DIAGNOSIS — H2513 Age-related nuclear cataract, bilateral: Secondary | ICD-10-CM | POA: Diagnosis not present

## 2020-02-16 DIAGNOSIS — H40003 Preglaucoma, unspecified, bilateral: Secondary | ICD-10-CM | POA: Diagnosis not present

## 2020-02-17 ENCOUNTER — Inpatient Hospital Stay
Admission: AD | Admit: 2020-02-17 | Payer: Medicare Other | Source: Other Acute Inpatient Hospital | Admitting: Family Medicine

## 2020-02-17 ENCOUNTER — Telehealth: Payer: Self-pay | Admitting: Internal Medicine

## 2020-02-17 DIAGNOSIS — M069 Rheumatoid arthritis, unspecified: Secondary | ICD-10-CM | POA: Diagnosis not present

## 2020-02-17 DIAGNOSIS — M797 Fibromyalgia: Secondary | ICD-10-CM | POA: Diagnosis not present

## 2020-02-17 DIAGNOSIS — K219 Gastro-esophageal reflux disease without esophagitis: Secondary | ICD-10-CM | POA: Diagnosis not present

## 2020-02-17 DIAGNOSIS — I2510 Atherosclerotic heart disease of native coronary artery without angina pectoris: Secondary | ICD-10-CM | POA: Diagnosis not present

## 2020-02-17 DIAGNOSIS — N281 Cyst of kidney, acquired: Secondary | ICD-10-CM | POA: Diagnosis not present

## 2020-02-17 DIAGNOSIS — R509 Fever, unspecified: Secondary | ICD-10-CM | POA: Diagnosis not present

## 2020-02-17 DIAGNOSIS — Z66 Do not resuscitate: Secondary | ICD-10-CM | POA: Diagnosis not present

## 2020-02-17 DIAGNOSIS — R1013 Epigastric pain: Secondary | ICD-10-CM | POA: Diagnosis not present

## 2020-02-17 DIAGNOSIS — Z20822 Contact with and (suspected) exposure to covid-19: Secondary | ICD-10-CM | POA: Diagnosis not present

## 2020-02-17 DIAGNOSIS — K921 Melena: Secondary | ICD-10-CM | POA: Diagnosis not present

## 2020-02-17 DIAGNOSIS — D62 Acute posthemorrhagic anemia: Secondary | ICD-10-CM | POA: Diagnosis not present

## 2020-02-17 DIAGNOSIS — I10 Essential (primary) hypertension: Secondary | ICD-10-CM | POA: Diagnosis not present

## 2020-02-17 DIAGNOSIS — Z906 Acquired absence of other parts of urinary tract: Secondary | ICD-10-CM | POA: Diagnosis not present

## 2020-02-17 DIAGNOSIS — D500 Iron deficiency anemia secondary to blood loss (chronic): Secondary | ICD-10-CM | POA: Diagnosis not present

## 2020-02-17 DIAGNOSIS — K449 Diaphragmatic hernia without obstruction or gangrene: Secondary | ICD-10-CM | POA: Diagnosis not present

## 2020-02-17 DIAGNOSIS — R000 Tachycardia, unspecified: Secondary | ICD-10-CM | POA: Diagnosis not present

## 2020-02-17 DIAGNOSIS — K922 Gastrointestinal hemorrhage, unspecified: Secondary | ICD-10-CM | POA: Diagnosis not present

## 2020-02-17 DIAGNOSIS — K589 Irritable bowel syndrome without diarrhea: Secondary | ICD-10-CM | POA: Diagnosis not present

## 2020-02-17 DIAGNOSIS — E785 Hyperlipidemia, unspecified: Secondary | ICD-10-CM | POA: Diagnosis not present

## 2020-02-17 NOTE — Telephone Encounter (Signed)
Patient calling states he is hemorrhaging and is seeking urgent advise

## 2020-02-17 NOTE — Telephone Encounter (Signed)
Pt states he took a benadryl last night and then he started passing BRB in his stools. Reports it continued all night long. Instructed pt that he needs to go to the ER to be evaluated. Pt verbalized understanding.

## 2020-02-18 DIAGNOSIS — K921 Melena: Secondary | ICD-10-CM | POA: Insufficient documentation

## 2020-02-19 DIAGNOSIS — K5730 Diverticulosis of large intestine without perforation or abscess without bleeding: Secondary | ICD-10-CM | POA: Diagnosis not present

## 2020-02-19 DIAGNOSIS — K640 First degree hemorrhoids: Secondary | ICD-10-CM | POA: Diagnosis not present

## 2020-02-19 DIAGNOSIS — I499 Cardiac arrhythmia, unspecified: Secondary | ICD-10-CM | POA: Diagnosis not present

## 2020-02-19 DIAGNOSIS — K219 Gastro-esophageal reflux disease without esophagitis: Secondary | ICD-10-CM | POA: Diagnosis not present

## 2020-02-19 DIAGNOSIS — D62 Acute posthemorrhagic anemia: Secondary | ICD-10-CM | POA: Diagnosis not present

## 2020-02-19 DIAGNOSIS — M797 Fibromyalgia: Secondary | ICD-10-CM | POA: Diagnosis not present

## 2020-02-19 DIAGNOSIS — I2510 Atherosclerotic heart disease of native coronary artery without angina pectoris: Secondary | ICD-10-CM | POA: Diagnosis not present

## 2020-02-19 DIAGNOSIS — K625 Hemorrhage of anus and rectum: Secondary | ICD-10-CM | POA: Diagnosis not present

## 2020-02-19 DIAGNOSIS — D649 Anemia, unspecified: Secondary | ICD-10-CM | POA: Diagnosis not present

## 2020-02-19 DIAGNOSIS — R55 Syncope and collapse: Secondary | ICD-10-CM | POA: Diagnosis not present

## 2020-02-19 DIAGNOSIS — Z932 Ileostomy status: Secondary | ICD-10-CM | POA: Diagnosis not present

## 2020-02-19 DIAGNOSIS — R079 Chest pain, unspecified: Secondary | ICD-10-CM | POA: Diagnosis not present

## 2020-02-19 DIAGNOSIS — I10 Essential (primary) hypertension: Secondary | ICD-10-CM | POA: Diagnosis not present

## 2020-02-19 DIAGNOSIS — M069 Rheumatoid arthritis, unspecified: Secondary | ICD-10-CM | POA: Diagnosis not present

## 2020-02-19 DIAGNOSIS — Z79899 Other long term (current) drug therapy: Secondary | ICD-10-CM | POA: Diagnosis not present

## 2020-02-19 DIAGNOSIS — E785 Hyperlipidemia, unspecified: Secondary | ICD-10-CM | POA: Diagnosis not present

## 2020-02-19 DIAGNOSIS — K922 Gastrointestinal hemorrhage, unspecified: Secondary | ICD-10-CM | POA: Diagnosis not present

## 2020-02-19 DIAGNOSIS — D500 Iron deficiency anemia secondary to blood loss (chronic): Secondary | ICD-10-CM | POA: Diagnosis not present

## 2020-02-19 DIAGNOSIS — K921 Melena: Secondary | ICD-10-CM | POA: Diagnosis not present

## 2020-02-19 DIAGNOSIS — M1990 Unspecified osteoarthritis, unspecified site: Secondary | ICD-10-CM | POA: Diagnosis not present

## 2020-02-19 DIAGNOSIS — Z66 Do not resuscitate: Secondary | ICD-10-CM | POA: Diagnosis not present

## 2020-02-19 DIAGNOSIS — R109 Unspecified abdominal pain: Secondary | ICD-10-CM | POA: Diagnosis not present

## 2020-02-19 DIAGNOSIS — K449 Diaphragmatic hernia without obstruction or gangrene: Secondary | ICD-10-CM | POA: Diagnosis not present

## 2020-02-19 DIAGNOSIS — K2971 Gastritis, unspecified, with bleeding: Secondary | ICD-10-CM | POA: Diagnosis not present

## 2020-02-19 DIAGNOSIS — K6389 Other specified diseases of intestine: Secondary | ICD-10-CM | POA: Diagnosis not present

## 2020-02-19 DIAGNOSIS — Z888 Allergy status to other drugs, medicaments and biological substances status: Secondary | ICD-10-CM | POA: Diagnosis not present

## 2020-02-19 DIAGNOSIS — K5791 Diverticulosis of intestine, part unspecified, without perforation or abscess with bleeding: Secondary | ICD-10-CM | POA: Diagnosis not present

## 2020-02-21 DIAGNOSIS — Z9889 Other specified postprocedural states: Secondary | ICD-10-CM | POA: Insufficient documentation

## 2020-02-22 MED ORDER — ACETAMINOPHEN 325 MG PO TABS
650.00 | ORAL_TABLET | ORAL | Status: DC
Start: ? — End: 2020-02-22

## 2020-02-22 MED ORDER — MELATONIN 3 MG PO TABS
3.00 | ORAL_TABLET | ORAL | Status: DC
Start: ? — End: 2020-02-22

## 2020-02-22 MED ORDER — GENERIC EXTERNAL MEDICATION
Status: DC
Start: ? — End: 2020-02-22

## 2020-02-22 MED ORDER — GUAIFENESIN 100 MG/5ML PO SYRP
200.00 | ORAL_SOLUTION | ORAL | Status: DC
Start: ? — End: 2020-02-22

## 2020-02-23 ENCOUNTER — Ambulatory Visit: Payer: Medicare Other | Admitting: Internal Medicine

## 2020-02-26 ENCOUNTER — Telehealth: Payer: Self-pay

## 2020-02-26 MED ORDER — LACTATED RINGERS IV SOLN
10.00 | INTRAVENOUS | Status: DC
Start: ? — End: 2020-02-26

## 2020-02-26 MED ORDER — GENERIC EXTERNAL MEDICATION
Status: DC
Start: ? — End: 2020-02-26

## 2020-02-26 MED ORDER — LORAZEPAM 0.5 MG PO TABS
0.25 | ORAL_TABLET | ORAL | Status: DC
Start: ? — End: 2020-02-26

## 2020-02-26 NOTE — Telephone Encounter (Signed)
Okay to wait for appointment. Can take tylenol for pain NO NSAIDS. If any lightheadedness, dizziness, chest pain, passing out seek care at ER sooner.

## 2020-02-26 NOTE — Telephone Encounter (Signed)
Notified pt w/MD response.../lmb 

## 2020-02-26 NOTE — Telephone Encounter (Signed)
Called pt to get more information. Pt was D/C 02/24/20 from [facility]. He states he was admitted for GI bleed. He sates he has checked his BP running 117/59. (P) 79. He states other than the head pounding he feel ok. He has an appt schedule w/Dr. Jenny Reichmann 03/03/20.Pls advise....[name]

## 2020-02-26 NOTE — Telephone Encounter (Signed)
New message    The patient was recently discharged from the hospital   Upcoming appt on  4.22.21 with Dr. Annamary Carolin heading pounding with the pulse

## 2020-03-01 DIAGNOSIS — I2510 Atherosclerotic heart disease of native coronary artery without angina pectoris: Secondary | ICD-10-CM | POA: Diagnosis not present

## 2020-03-01 DIAGNOSIS — R079 Chest pain, unspecified: Secondary | ICD-10-CM | POA: Diagnosis not present

## 2020-03-03 ENCOUNTER — Other Ambulatory Visit: Payer: Self-pay

## 2020-03-03 ENCOUNTER — Encounter: Payer: Self-pay | Admitting: Internal Medicine

## 2020-03-03 ENCOUNTER — Ambulatory Visit (INDEPENDENT_AMBULATORY_CARE_PROVIDER_SITE_OTHER): Payer: Medicare Other | Admitting: Internal Medicine

## 2020-03-03 VITALS — BP 120/70 | HR 77 | Temp 98.6°F | Ht 67.0 in | Wt 164.0 lb

## 2020-03-03 DIAGNOSIS — I25118 Atherosclerotic heart disease of native coronary artery with other forms of angina pectoris: Secondary | ICD-10-CM | POA: Diagnosis not present

## 2020-03-03 DIAGNOSIS — R739 Hyperglycemia, unspecified: Secondary | ICD-10-CM | POA: Diagnosis not present

## 2020-03-03 DIAGNOSIS — E785 Hyperlipidemia, unspecified: Secondary | ICD-10-CM

## 2020-03-03 DIAGNOSIS — D649 Anemia, unspecified: Secondary | ICD-10-CM | POA: Diagnosis not present

## 2020-03-03 LAB — CBC WITH DIFFERENTIAL/PLATELET
Basophils Absolute: 0.1 10*3/uL (ref 0.0–0.1)
Basophils Relative: 1.3 % (ref 0.0–3.0)
Eosinophils Absolute: 0.1 10*3/uL (ref 0.0–0.7)
Eosinophils Relative: 1.6 % (ref 0.0–5.0)
HCT: 29.3 % — ABNORMAL LOW (ref 39.0–52.0)
Hemoglobin: 9.5 g/dL — ABNORMAL LOW (ref 13.0–17.0)
Lymphocytes Relative: 30.3 % (ref 12.0–46.0)
Lymphs Abs: 2.6 10*3/uL (ref 0.7–4.0)
MCHC: 32.5 g/dL (ref 30.0–36.0)
MCV: 89.9 fl (ref 78.0–100.0)
Monocytes Absolute: 0.9 10*3/uL (ref 0.1–1.0)
Monocytes Relative: 10.8 % (ref 3.0–12.0)
Neutro Abs: 4.7 10*3/uL (ref 1.4–7.7)
Neutrophils Relative %: 56 % (ref 43.0–77.0)
Platelets: 620 10*3/uL — ABNORMAL HIGH (ref 150.0–400.0)
RBC: 3.26 Mil/uL — ABNORMAL LOW (ref 4.22–5.81)
RDW: 16 % — ABNORMAL HIGH (ref 11.5–15.5)
WBC: 8.5 10*3/uL (ref 4.0–10.5)

## 2020-03-03 LAB — IBC PANEL
Iron: 15 ug/dL — ABNORMAL LOW (ref 42–165)
Saturation Ratios: 3.2 % — ABNORMAL LOW (ref 20.0–50.0)
Transferrin: 330 mg/dL (ref 212.0–360.0)

## 2020-03-03 LAB — FERRITIN: Ferritin: 29.2 ng/mL (ref 22.0–322.0)

## 2020-03-03 NOTE — Patient Instructions (Addendum)
OK to take OTC iron sulfate 325 mg - 1 per day for 3-6 months  Please continue all other medications as before, and refills have been done if requested.  Please have the pharmacy call with any other refills you may need  Please keep your appointments with your specialists as you may have planned- Dr Carlean Purl on May 5  Please go to the LAB at the blood drawing area for the tests to be done  You will be contacted by phone if any changes need to be made immediately.  Otherwise, you will receive a letter about your results with an explanation, but please check with MyChart first.  Please remember to sign up for MyChart if you have not done so, as this will be important to you in the future with finding out test results, communicating by private email, and scheduling acute appointments online when needed.  Please also plan for now to come back monthly for at least 3 months for follow up CBC - just go to the first floor lab

## 2020-03-03 NOTE — Progress Notes (Signed)
Subjective:    Patient ID: Erik Lolling Sr., male    DOB: [birth year], 74 y.o.   MRN: [id number]  HPI  Here to f/u recent hospn, He was recently admitted with GI bleed with melena which was thought to be the cause for anemia and in that setting he had multiple episodes of chest pain lasting for several minutes at a time.  Extensive GI evaluation without specific cause of gi bleeding after EGD X 2, colonoscopy, and capsule endoscopy.  S/p 4 u pRBC.  Last Hgb 8.7 on aor 14.  Seen per [facility] cards on apr 20 with plan for nuclear stress test.  No further cp or overt bleeding post d.c and .Pt denies chest pain, increased sob or doe, wheezing, orthopnea, PND, increased LE swelling, palpitations, dizziness or syncope.  Pt denies new neurological symptoms such as new headache, or facial or extremity weakness or numbness  Pt denies polydipsia, polyuria,  Not on anticoagulant, to stay on asa 81 qd per cards  S/p covid vaccin mar 1 and mar 29 moderna.  Does c/o general fatigue, general weakness, dizzy, HA Past Medical History:  Diagnosis Date  . Allergic rhinitis   . Allergy   . Anal fissure   . Anxiety   . Cataract    starting  . Coronary atherosclerosis of native coronary artery 2007   nonobstructive CAD by cath with 40% mid-LAD stenosis  . Degenerative arthritis of knee, bilateral 10/09/2017  . Depression   . Diverticulosis 12/18/2011  . Fibromyalgia   . GERD with stricture   . Helicobacter pylori (H. pylori) infection 11/25/2012   11/2012 EGD + gastric bxs  . Hiatal hernia   . HLD (hyperlipidemia)   . HTN (hypertension)   . Hyperlipidemia   . IBS (irritable bowel syndrome)   . Impotence of organic origin   . Insomnia   . Internal and external hemorrhoids without complication   . Interstitial cystitis   . Irritable bowel syndrome 06/09/2010   Qualifier: Diagnosis of  By: Carlean Purl MD, Tonna Boehringer E   . Pelvic floor dysfunction 11/29/2017  . Reactive hypoglycemia   . Rheumatoid arthritis(714.0)   .  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 Vitamin D deficiency 11/29/2017   Past Surgical History:  Procedure Laterality Date  . bladder distention     x 6  . CATHETER REMOVAL     super pubic area  . COLONOSCOPY    . DENTAL SURGERY Right    #30 tooth extraction (right upper)  . INTERSTIM IMPLANT PLACEMENT    . INTERSTIM IMPLANT REMOVAL    . KNEE ARTHROSCOPY     right x 2  . NISSEN FUNDOPLICATION  123XX123  . SHOULDER ARTHROSCOPY  2010   left  . TONSILLECTOMY AND ADENOIDECTOMY  1957  . TRANSURETHRAL RESECTION OF PROSTATE     x 2  . UPPER GASTROINTESTINAL ENDOSCOPY  05/13/2008   hiatal hernia  . VASECTOMY      reports that he has quit smoking. He has never used smokeless tobacco. He reports that he does not drink alcohol or use drugs. family history includes Breast cancer in his sister; Colon polyps in his father; Diabetes in his father, paternal uncle, and sister; Heart disease in his father and mother; Hypertension in his father and mother; Stroke in his mother. Allergies  Allergen Reactions  . Ambien [Zolpidem]   . Lexapro [Escitalopram Oxalate]   . Pneumovax [Pneumococcal Polysaccharide Vaccine] Other (See Comments)    pain  . Prilosec [Omeprazole]  Other (See Comments)    Per patient joint pain with PPI's  . Remeron [Mirtazapine]   . Statins Other (See Comments)    Joint pain  . Tramadol Other (See Comments)   Current Outpatient Medications on File Prior to Visit  Medication Sig Dispense Refill  . AMBULATORY NON FORMULARY MEDICATION Diltiazem 2% cream Apply a pea size amount to her rectum twice a day 30 g 3  . aspirin 81 MG chewable tablet Chew by mouth daily.    . Cholecalciferol 125 MCG (5000 UT) TABS Take by mouth.    . cyclobenzaprine (FLEXERIL) 5 MG tablet Take 5 mg by mouth 2 (two) times daily.    Marland Kitchen dicyclomine (BENTYL) 10 MG capsule TAKE 1 CAPSULE BY MOUTH 4 TIMES A DAY BEFORE MEALS AND NIGHTLY AS NEEDED    . famotidine (PEPCID) 40 MG tablet Take 1 tablet (40 mg total) by  mouth 2 (two) times daily. 180 tablet 3  . hydrocortisone cream 1 % Apply topically.    . hydrOXYzine (ATARAX/VISTARIL) 10 MG tablet Take 10 mg by mouth at bedtime.    . hydrOXYzine (ATARAX/VISTARIL) 25 MG tablet Take 25 mg by mouth 3 (three) times daily as needed. 1/2 tablet qhs prn    . LORazepam (ATIVAN) 0.5 MG tablet Take 0.5 tablets (0.25 mg total) by mouth 2 (two) times daily as needed for anxiety. 30 tablet 5  . MELATONIN PO Take by mouth.    . traMADol (ULTRAM) 50 MG tablet TAKE 1 TABLET(50 MG) BY MOUTH EVERY 6 HOURS AS NEEDED 120 tablet 0  . DULoxetine (CYMBALTA) 60 MG capsule Take 1 capsule (60 mg total) by mouth daily. (Patient not taking: Reported on 03/03/2020) 90 capsule 3   No current facility-administered medications on file prior to visit.   .Review of Systems All otherwise neg per pt     Objective:   Physical Exam BP 120/70 (BP Location: Left Arm, Patient Position: Sitting, Cuff Size: Large)   Pulse 77   Temp 98.6 F (37 C) (Oral)   Ht 5\' 7"  (1.702 m)   Wt 164 lb (74.4 kg)   SpO2 99%   BMI 25.69 kg/m  VS noted,  Constitutional: Pt appears in NAD HENT: Head: NCAT.  Right Ear: External ear normal.  Left Ear: External ear normal.  Eyes: . Pupils are equal, round, and reactive to light. Conjunctivae and EOM are normal Nose: without d/c or deformity Neck: Neck supple. Gross normal ROM Cardiovascular: Normal rate and regular rhythm.   Pulmonary/Chest: Effort normal and breath sounds without rales or wheezing.  Abd:  Soft, NT, ND, + BS, no organomegaly Neurological: Pt is alert. At baseline orientation, motor grossly intact Skin: Skin is warm. No rashes, other new lesions, no LE edema Psychiatric: Pt behavior is normal without agitation  All otherwise neg per pt  Lab Results  Component Value Date   WBC 8.5 03/03/2020   HGB 9.5 (L) 03/03/2020   HCT 29.3 (L) 03/03/2020   PLT 620.0 (H) 03/03/2020   GLUCOSE 137 (H) 06/30/2019   CHOL 163 06/30/2019   TRIG 132.0  06/30/2019   HDL 37.20 (L) 06/30/2019   LDLDIRECT 169.0 06/24/2018   LDLCALC 100 (H) 06/30/2019   ALT 11 06/30/2019   AST 10 06/30/2019   NA 136 06/30/2019   K 4.2 06/30/2019   CL 102 06/30/2019   CREATININE 1.05 06/30/2019   BUN 17 06/30/2019   CO2 25 06/30/2019   TSH 1.35 06/30/2019   PSA 0.00 (L) 06/30/2019  HGBA1C 5.9 06/30/2019      Assessment & Plan:

## 2020-03-06 ENCOUNTER — Encounter: Payer: Self-pay | Admitting: Internal Medicine

## 2020-03-06 NOTE — Assessment & Plan Note (Signed)
stable overall by history and exam, recent data reviewed with pt, and pt to continue medical treatment as before,  to f/u any worsening symptoms or concerns  

## 2020-03-06 NOTE — Assessment & Plan Note (Addendum)
For lab f/u today, then monthly to assess for any chronic gi blood loss, but by hx sound more acute   I spent 42 minutes in preparing to see the patient by review of recent labs, imaging and procedures, obtaining and reviewing separately obtained history, communicating with the patient and family or caregiver, ordering medications, tests or procedures, and documenting clinical information in the EHR including the differential Dx, treatment, and any further evaluation and other management of anemia, cad, hyperglycemia, HLD

## 2020-03-06 NOTE — Assessment & Plan Note (Signed)
With recent atypical cp - for f/u stress test, cont asa per cards

## 2020-03-16 ENCOUNTER — Encounter: Payer: Self-pay | Admitting: Internal Medicine

## 2020-03-16 ENCOUNTER — Ambulatory Visit: Payer: Medicare Other | Admitting: Internal Medicine

## 2020-03-16 VITALS — BP 124/62 | HR 67 | Temp 98.5°F | Ht 67.0 in | Wt 162.0 lb

## 2020-03-16 DIAGNOSIS — K580 Irritable bowel syndrome with diarrhea: Secondary | ICD-10-CM

## 2020-03-16 DIAGNOSIS — D62 Acute posthemorrhagic anemia: Secondary | ICD-10-CM | POA: Diagnosis not present

## 2020-03-16 DIAGNOSIS — R0789 Other chest pain: Secondary | ICD-10-CM | POA: Diagnosis not present

## 2020-03-16 DIAGNOSIS — K5731 Diverticulosis of large intestine without perforation or abscess with bleeding: Secondary | ICD-10-CM | POA: Diagnosis not present

## 2020-03-16 NOTE — Patient Instructions (Signed)
Go back on your iron supplement.   We are giving you a diverticulosis handout to read.   Follow up with [name] as needed.   I appreciate the opportunity to care for you. Silvano Rusk, MD, [facility]

## 2020-03-16 NOTE — Progress Notes (Signed)
Erik SOUFFRONT Sr. 74 y.o. [birth year] [id number]  Assessment & Plan:   Encounter Diagnoses  Name Primary?  . Diverticulosis of colon with hemorrhage Yes  . Acute blood loss anemia   . Irritable bowel syndrome with diarrhea   . Atypical chest pain     It sounds like he had a diverticular bleed.  He should resume his iron therapy and follow-up with Dr. Jenny Reichmann as planned.  His atypical chest pain is getting a cardiac work-up he has fibromyalgia and some matization issues and that may be underlying it.  I do not think it is related to the lorazepam.  I reviewed what diverticulosis is and how we think that bleeding starts and how it may occur again but there are no medicines or lifestyle changes that are proven to change risk for this.  He will see me as needed.  CC: [name], MD  Subjective:   Chief Complaint: Follow-up after GI bleed  HPI The patient is here with his wife for follow-up, he was hospitalized in the [facility] health system last month with a GI bleed.  He had hematochezia he went to the hospital in [place] where he had an EGD that was unrevealing and he was prepping for a colonoscopy and had more hematochezia and was then transferred to [facility] where he had a repeat EGD a colonoscopy and a capsule endoscopy of the small bowel.  There was blood seen in the colon on the capsule endoscopy he has diverticulosis and as best I can tell the conclusion was he had a diverticular hemorrhage and he has not had recurrent bleeding.  He has been treated with iron and has had follow-up blood counts with Dr. Jenny Reichmann, the patient did temporarily stop his iron to see if he was having any more bleeding, his stools are dark brown.  He understands he needs to resume his iron.  Other issues have been some atypical chest pain he has seen cardiology and is going to have a nuclear stress test.  He seems to related to the initiation or restarting lorazepam which helps with irritable bowel-like  symptoms.  The pain is a 8/10 dull or can be sharp it is fleeting but he can awaken at night.  It was going on before his GI bleed.  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  He asks if prostaglandin penile injections had anything to do with the GI bleeding and wants to know if there was anything he did or what caused the diverticular hemorrhage. Allergies  Allergen Reactions  . Ambien [Zolpidem]   . Lexapro [Escitalopram Oxalate]   . Pneumovax [Pneumococcal Polysaccharide Vaccine] Other (See Comments)    pain  . Prilosec [Omeprazole] Other (See Comments)    Per patient joint pain with PPI's  . Remeron [Mirtazapine]   . Statins Other (See Comments)    Joint pain  . Tramadol Other (See Comments)   No outpatient medications have been marked as taking for the 03/16/20 encounter (Office Visit) with Gatha Mayer, MD.   Past Medical History:  Diagnosis Date  . Allergic rhinitis   . Allergy   . Anal fissure   . Anxiety   . Cataract    starting  . Coronary atherosclerosis of native coronary artery 2007   nonobstructive CAD by cath with 40%  mid-LAD stenosis  . Degenerative arthritis of knee, bilateral 10/09/2017  . Depression   . Diverticulosis 12/18/2011  . Fibromyalgia   . GERD with stricture   . Helicobacter pylori (H. pylori) infection 11/25/2012   11/2012 EGD + gastric bxs  . Hiatal hernia   . HLD (hyperlipidemia)   . HTN (hypertension)   . Hyperlipidemia   . IBS (irritable bowel syndrome)   . Impotence of organic origin   . Insomnia   . Internal and external hemorrhoids without complication   . Interstitial cystitis   . Irritable bowel syndrome 06/09/2010   Qualifier: Diagnosis of  By: Carlean Purl MD, Tonna Boehringer E   . Pelvic floor dysfunction 11/29/2017  . Reactive hypoglycemia   . Rheumatoid arthritis(714.0)   . Somatization  disorder 02/27/2012  . Vitamin D deficiency 11/29/2017   Past Surgical History:  Procedure Laterality Date  . bladder distention     x 6  . CATHETER REMOVAL     super pubic area  . COLONOSCOPY    . DENTAL SURGERY Right    #30 tooth extraction (right upper)  . INTERSTIM IMPLANT PLACEMENT    . INTERSTIM IMPLANT REMOVAL    . KNEE ARTHROSCOPY     right x 2  . NISSEN FUNDOPLICATION  123XX123  . SHOULDER ARTHROSCOPY  2010   left  . TONSILLECTOMY AND ADENOIDECTOMY  1957  . TRANSURETHRAL RESECTION OF PROSTATE     x 2  . UPPER GASTROINTESTINAL ENDOSCOPY  05/13/2008   hiatal hernia  . 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   Review of Systems As above  Objective:   Physical Exam BP 124/62   Pulse 67   Temp 98.5 F (36.9 C)   Ht 5\' 7"  (1.702 m)   Wt 162 lb (73.5 kg)   BMI 25.37 kg/m     CBC Latest Ref Rng & Units 03/03/2020 06/30/2019 06/24/2018  WBC 4.0 - 10.5 K/uL 8.5 11.8(H) 8.7  Hemoglobin 13.0 - 17.0 g/dL 9.5(L) 11.6(L) 16.0  Hematocrit 39.0 - 52.0 % 29.3(L) 35.0(L) 45.7  Platelets 150.0 - 400.0 K/uL 620.0(H) 535.0(H) 296.0   Lab Results  Component Value Date   FERRITIN 29.2 03/03/2020   Data reviewed include [facility] hospitalization records from April, primary care notes labs in the computer/EMR  Total time 32 minutes

## 2020-03-21 ENCOUNTER — Telehealth: Payer: Self-pay | Admitting: Internal Medicine

## 2020-03-21 ENCOUNTER — Encounter: Payer: Self-pay | Admitting: Internal Medicine

## 2020-03-21 DIAGNOSIS — R42 Dizziness and giddiness: Secondary | ICD-10-CM

## 2020-03-21 NOTE — Telephone Encounter (Signed)
Ok labs are ordered 

## 2020-03-22 ENCOUNTER — Other Ambulatory Visit (INDEPENDENT_AMBULATORY_CARE_PROVIDER_SITE_OTHER): Payer: Medicare Other

## 2020-03-22 DIAGNOSIS — R42 Dizziness and giddiness: Secondary | ICD-10-CM | POA: Diagnosis not present

## 2020-03-22 LAB — CBC WITH DIFFERENTIAL/PLATELET
Basophils Absolute: 0.1 10*3/uL (ref 0.0–0.1)
Basophils Relative: 0.6 % (ref 0.0–3.0)
Eosinophils Absolute: 0.1 10*3/uL (ref 0.0–0.7)
Eosinophils Relative: 0.5 % (ref 0.0–5.0)
HCT: 29.9 % — ABNORMAL LOW (ref 39.0–52.0)
Hemoglobin: 9.5 g/dL — ABNORMAL LOW (ref 13.0–17.0)
Lymphocytes Relative: 19.6 % (ref 12.0–46.0)
Lymphs Abs: 2 10*3/uL (ref 0.7–4.0)
MCHC: 31.9 g/dL (ref 30.0–36.0)
MCV: 84.5 fl (ref 78.0–100.0)
Monocytes Absolute: 1.2 10*3/uL — ABNORMAL HIGH (ref 0.1–1.0)
Monocytes Relative: 11.2 % (ref 3.0–12.0)
Neutro Abs: 7 10*3/uL (ref 1.4–7.7)
Neutrophils Relative %: 68.1 % (ref 43.0–77.0)
Platelets: 473 10*3/uL — ABNORMAL HIGH (ref 150.0–400.0)
RBC: 3.54 Mil/uL — ABNORMAL LOW (ref 4.22–5.81)
RDW: 17.6 % — ABNORMAL HIGH (ref 11.5–15.5)
WBC: 10.3 10*3/uL (ref 4.0–10.5)

## 2020-03-22 LAB — BASIC METABOLIC PANEL
BUN: 18 mg/dL (ref 6–23)
CO2: 24 mEq/L (ref 19–32)
Calcium: 9.4 mg/dL (ref 8.4–10.5)
Chloride: 103 mEq/L (ref 96–112)
Creatinine, Ser: 1.33 mg/dL (ref 0.40–1.50)
GFR: 52.7 mL/min — ABNORMAL LOW (ref >60.00)
Glucose, Bld: 160 mg/dL — ABNORMAL HIGH (ref 70–99)
Potassium: 3.9 mEq/L (ref 3.5–5.1)
Sodium: 135 mEq/L (ref 135–145)

## 2020-03-25 DIAGNOSIS — Z932 Ileostomy status: Secondary | ICD-10-CM | POA: Diagnosis not present

## 2020-04-01 DIAGNOSIS — R42 Dizziness and giddiness: Secondary | ICD-10-CM | POA: Diagnosis not present

## 2020-04-01 DIAGNOSIS — R0982 Postnasal drip: Secondary | ICD-10-CM | POA: Diagnosis not present

## 2020-04-26 DIAGNOSIS — G546 Phantom limb syndrome with pain: Secondary | ICD-10-CM | POA: Diagnosis not present

## 2020-04-26 DIAGNOSIS — Z932 Ileostomy status: Secondary | ICD-10-CM | POA: Diagnosis not present

## 2020-04-26 DIAGNOSIS — E162 Hypoglycemia, unspecified: Secondary | ICD-10-CM | POA: Diagnosis not present

## 2020-05-11 ENCOUNTER — Ambulatory Visit: Payer: Medicare Other | Admitting: Psychologist

## 2020-05-26 DIAGNOSIS — Z932 Ileostomy status: Secondary | ICD-10-CM | POA: Diagnosis not present

## 2020-05-27 ENCOUNTER — Ambulatory Visit (INDEPENDENT_AMBULATORY_CARE_PROVIDER_SITE_OTHER): Payer: Medicare Other | Admitting: Psychologist

## 2020-05-27 DIAGNOSIS — F4310 Post-traumatic stress disorder, unspecified: Secondary | ICD-10-CM

## 2020-05-27 DIAGNOSIS — F320 Major depressive disorder, single episode, mild: Secondary | ICD-10-CM | POA: Diagnosis not present

## 2020-05-31 ENCOUNTER — Telehealth: Payer: Self-pay | Admitting: Internal Medicine

## 2020-05-31 NOTE — Telephone Encounter (Signed)
Patient reports that he has been having cramping and diarrhea.  He has not taken the dicyclomine.  He is asked to start on a schedule and call back Thursday if that does not help. He asked that I schedule Dr. Celesta Aver next available for him on 07/22/09 9:10

## 2020-06-09 ENCOUNTER — Ambulatory Visit (INDEPENDENT_AMBULATORY_CARE_PROVIDER_SITE_OTHER): Payer: Medicare Other | Admitting: Psychologist

## 2020-06-09 DIAGNOSIS — F4310 Post-traumatic stress disorder, unspecified: Secondary | ICD-10-CM | POA: Diagnosis not present

## 2020-06-09 DIAGNOSIS — F320 Major depressive disorder, single episode, mild: Secondary | ICD-10-CM

## 2020-06-10 DIAGNOSIS — Z932 Ileostomy status: Secondary | ICD-10-CM | POA: Diagnosis not present

## 2020-06-27 ENCOUNTER — Other Ambulatory Visit: Payer: Self-pay

## 2020-06-27 DIAGNOSIS — Z932 Ileostomy status: Secondary | ICD-10-CM | POA: Diagnosis not present

## 2020-06-27 MED ORDER — DICYCLOMINE HCL 10 MG PO CAPS: ORAL_CAPSULE | ORAL | 0 refills | Status: DC

## 2020-06-27 NOTE — Telephone Encounter (Signed)
Dicyclomine has been refilled as patient requested in his [facility] message.

## 2020-07-04 DIAGNOSIS — I2510 Atherosclerotic heart disease of native coronary artery without angina pectoris: Secondary | ICD-10-CM | POA: Diagnosis not present

## 2020-07-04 DIAGNOSIS — R079 Chest pain, unspecified: Secondary | ICD-10-CM | POA: Diagnosis not present

## 2020-07-05 ENCOUNTER — Encounter: Payer: Self-pay | Admitting: Internal Medicine

## 2020-07-06 ENCOUNTER — Encounter: Payer: Self-pay | Admitting: Internal Medicine

## 2020-07-06 ENCOUNTER — Other Ambulatory Visit (INDEPENDENT_AMBULATORY_CARE_PROVIDER_SITE_OTHER): Payer: Medicare Other

## 2020-07-06 DIAGNOSIS — D649 Anemia, unspecified: Secondary | ICD-10-CM | POA: Diagnosis not present

## 2020-07-06 LAB — CBC WITH DIFFERENTIAL/PLATELET
Basophils Absolute: 0.1 10*3/uL (ref 0.0–0.1)
Basophils Relative: 0.9 % (ref 0.0–3.0)
Eosinophils Absolute: 0.1 10*3/uL (ref 0.0–0.7)
Eosinophils Relative: 1.3 % (ref 0.0–5.0)
HCT: 40.4 % (ref 39.0–52.0)
Hemoglobin: 13.2 g/dL (ref 13.0–17.0)
Lymphocytes Relative: 23.6 % (ref 12.0–46.0)
Lymphs Abs: 2.4 10*3/uL (ref 0.7–4.0)
MCHC: 32.6 g/dL (ref 30.0–36.0)
MCV: 78.2 fl (ref 78.0–100.0)
Monocytes Absolute: 1 10*3/uL (ref 0.1–1.0)
Monocytes Relative: 10 % (ref 3.0–12.0)
Neutro Abs: 6.6 10*3/uL (ref 1.4–7.7)
Neutrophils Relative %: 64.2 % (ref 43.0–77.0)
Platelets: 355 10*3/uL (ref 150.0–400.0)
RBC: 5.17 Mil/uL (ref 4.22–5.81)
RDW: 18.7 % — ABNORMAL HIGH (ref 11.5–15.5)
WBC: 10.2 10*3/uL (ref 4.0–10.5)

## 2020-07-17 ENCOUNTER — Other Ambulatory Visit: Payer: Self-pay | Admitting: Internal Medicine

## 2020-07-22 ENCOUNTER — Ambulatory Visit: Payer: Medicare Other | Admitting: Internal Medicine

## 2020-07-27 DIAGNOSIS — Z932 Ileostomy status: Secondary | ICD-10-CM | POA: Diagnosis not present

## 2020-07-28 DIAGNOSIS — M6289 Other specified disorders of muscle: Secondary | ICD-10-CM | POA: Diagnosis not present

## 2020-07-28 DIAGNOSIS — Z936 Other artificial openings of urinary tract status: Secondary | ICD-10-CM | POA: Diagnosis not present

## 2020-07-28 DIAGNOSIS — Z906 Acquired absence of other parts of urinary tract: Secondary | ICD-10-CM | POA: Diagnosis not present

## 2020-07-28 DIAGNOSIS — N281 Cyst of kidney, acquired: Secondary | ICD-10-CM | POA: Diagnosis not present

## 2020-08-14 ENCOUNTER — Other Ambulatory Visit: Payer: Self-pay | Admitting: Internal Medicine

## 2020-08-15 ENCOUNTER — Ambulatory Visit (INDEPENDENT_AMBULATORY_CARE_PROVIDER_SITE_OTHER): Payer: Medicare Other | Admitting: Internal Medicine

## 2020-08-15 ENCOUNTER — Encounter: Payer: Self-pay | Admitting: Internal Medicine

## 2020-08-15 VITALS — BP 124/64 | HR 66 | Ht 67.0 in | Wt 166.0 lb

## 2020-08-15 DIAGNOSIS — K580 Irritable bowel syndrome with diarrhea: Secondary | ICD-10-CM | POA: Diagnosis not present

## 2020-08-15 DIAGNOSIS — N9489 Other specified conditions associated with female genital organs and menstrual cycle: Secondary | ICD-10-CM

## 2020-08-15 DIAGNOSIS — N3010 Interstitial cystitis (chronic) without hematuria: Secondary | ICD-10-CM | POA: Diagnosis not present

## 2020-08-15 MED ORDER — ONDANSETRON HCL 4 MG PO TABS: 4.0000 mg | ORAL_TABLET | Freq: Two times a day (BID) | ORAL | 3 refills | Status: DC

## 2020-08-15 NOTE — Progress Notes (Signed)
[name]. 74 y.o. [birth year] [id number]  Assessment & Plan:   Encounter Diagnoses  Name Primary?  . Irritable bowel syndrome with diarrhea Yes  . High-tone pelvic floor dysfunction   . Interstitial cystitis    Agree with resuming pelvic floor physical therapy to try to treat his high tone pelvic floor dysfunction which is responsible for some if not many of his symptoms.  Discontinue dicyclomine due to side effects causing blurry vision  Trial of ondansetron for IBS-like symptoms with diarrhea 4 mg twice daily  I recommended he not take the D GL licorice.  That is reportedly helpful for reflux symptoms though it proclaims it can protect the gut lining I do not think it makes sense for him to try this.  Return as needed  CC: [name], MD Dr. Alona Bene   Subjective:   Chief Complaint: Irritable bowel syndrome flare  HPI The patient is a 74 year old man with interstitial cystitis status post cystectomy and significant IBS problems in the setting of high tone pelvic floor dysfunction who has chronic recurrent abdominal pain anal pain and is having diarrhea issues now.  Urgent defecation.  Recently saw [name] who recommended he retry pelvic floor physical therapy hydroxyzine and amitriptyline was also started.  He has been trying dicyclomine for symptom relief but it is causing blurry vision.  Several somewhat urgent loose stools a day at this time.  Not an unusual type of pattern for him.  Awakens early every morning with anal pain.  Has several questions about whether or not his flare of symptoms is related to his cystectomy and ileal conduit as he thinks things changed after that.  Also asking if taking an over-the-counter D GL licorice supplement makes sense to treat his problems.  Allergies  Allergen Reactions  . Ambien [Zolpidem]   . Bentyl [Dicyclomine]     Memory loss, anxiety, blurred vision  . Lexapro [Escitalopram Oxalate]   . Pneumovax  [Pneumococcal Polysaccharide Vaccine] Other (See Comments)    pain  . Prilosec [Omeprazole] Other (See Comments)    Per patient joint pain with PPI's  . Remeron [Mirtazapine]   . Statins Other (See Comments)    Joint pain  . Tramadol Other (See Comments)   Current Meds  Medication Sig  . aspirin 81 MG chewable tablet Chew by mouth daily.  . Cholecalciferol 125 MCG (5000 UT) TABS Take by mouth.  . cyclobenzaprine (FLEXERIL) 5 MG tablet Take 5 mg by mouth 2 (two) times daily.  Marland Kitchen dicyclomine (BENTYL) 10 MG capsule TAKE 1 CAPSULE BY MOUTH 4 TIMES A DAY BEFORE MEALS AND NIGHTLY AS NEEDED (Patient taking differently: TAKE 1 CAPSULE BY MOUTH daily as needed)  . famotidine (PEPCID) 40 MG tablet Take 1 tablet (40 mg total) by mouth 2 (two) times daily.  . hydrocortisone cream 1 % Apply topically.  . hydrOXYzine (ATARAX/VISTARIL) 10 MG tablet Take 10 mg by mouth at bedtime.  . hydrOXYzine (ATARAX/VISTARIL) 25 MG tablet Take 25 mg by mouth 3 (three) times daily as needed. 1/2 tablet qhs prn  . LORazepam (ATIVAN) 0.5 MG tablet Take 0.5 tablets (0.25 mg total) by mouth 2 (two) times daily as needed for anxiety.  Marland Kitchen MELATONIN PO Take by mouth.  . traMADol (ULTRAM) 50 MG tablet TAKE 1 TABLET(50 MG) BY MOUTH EVERY 6 HOURS AS NEEDED   Past Medical History:  Diagnosis Date  . Allergic rhinitis   . Allergy   . Anal fissure   .  Anxiety   . Cataract    starting  . Coronary atherosclerosis of native coronary artery 2007   nonobstructive CAD by cath with 40% mid-LAD stenosis  . Degenerative arthritis of knee, bilateral 10/09/2017  . Depression   . Diverticulosis 12/18/2011  . Diverticulosis of colon with hemorrhage    April 2021, [facility]  . Fibromyalgia   . GERD with stricture   . Helicobacter pylori (H. pylori) infection 11/25/2012   11/2012 EGD + gastric bxs  . Hiatal hernia   . HLD (hyperlipidemia)   . HTN (hypertension)   . Hyperlipidemia   . IBS (irritable bowel syndrome)   . Impotence  of organic origin   . Insomnia   . Internal and external hemorrhoids without complication   . Interstitial cystitis   . Irritable bowel syndrome 06/09/2010   Qualifier: Diagnosis of  By: Carlean Purl MD, Tonna Boehringer E   . Pelvic floor dysfunction 11/29/2017  . Reactive hypoglycemia   . Rheumatoid arthritis(714.0)   . 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 Vitamin D deficiency 11/29/2017   Past Surgical History:  Procedure Laterality Date  . bladder distention     x 6  . CATHETER REMOVAL     super pubic area  . COLONOSCOPY    . DENTAL SURGERY Right    #30 tooth extraction (right upper)  . INTERSTIM IMPLANT PLACEMENT    . INTERSTIM IMPLANT REMOVAL    . KNEE ARTHROSCOPY     right x 2  . NISSEN FUNDOPLICATION  0350  . SHOULDER ARTHROSCOPY  2010   left  . TONSILLECTOMY AND ADENOIDECTOMY  1957  . TRANSURETHRAL RESECTION OF PROSTATE     x 2  . UPPER GASTROINTESTINAL ENDOSCOPY  05/13/2008   hiatal hernia  . 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   Review of Systems As above  Objective:   Physical Exam BP 124/64   Pulse 66   Ht 5\' 7"  (1.702 m)   Wt 166 lb (75.3 kg)   BMI 26.00 kg/m    23 minutes total time

## 2020-08-15 NOTE — Patient Instructions (Addendum)
Stop your dicyclomine per Dr Carlean Purl.  We have sent the following medications to your pharmacy for you to pick up at your convenience: Ondansetron  Dr Carlean Purl agree's that you should try pelvic floor physical therapy that Dr Amalia Hailey ordered.  Per Dr Carlean Purl do not take the [facility] supplement.  I appreciate the opportunity to care for you. Silvano Rusk, MD. [name]

## 2020-08-18 DIAGNOSIS — M6289 Other specified disorders of muscle: Secondary | ICD-10-CM | POA: Diagnosis not present

## 2020-08-18 DIAGNOSIS — K582 Mixed irritable bowel syndrome: Secondary | ICD-10-CM | POA: Diagnosis not present

## 2020-08-18 DIAGNOSIS — M62838 Other muscle spasm: Secondary | ICD-10-CM | POA: Diagnosis not present

## 2020-08-23 DIAGNOSIS — M6289 Other specified disorders of muscle: Secondary | ICD-10-CM | POA: Diagnosis not present

## 2020-08-23 DIAGNOSIS — M62838 Other muscle spasm: Secondary | ICD-10-CM | POA: Diagnosis not present

## 2020-08-23 DIAGNOSIS — K582 Mixed irritable bowel syndrome: Secondary | ICD-10-CM | POA: Diagnosis not present

## 2020-08-29 DIAGNOSIS — Z932 Ileostomy status: Secondary | ICD-10-CM | POA: Diagnosis not present

## 2020-08-30 DIAGNOSIS — M62838 Other muscle spasm: Secondary | ICD-10-CM | POA: Diagnosis not present

## 2020-08-30 DIAGNOSIS — K582 Mixed irritable bowel syndrome: Secondary | ICD-10-CM | POA: Diagnosis not present

## 2020-08-30 DIAGNOSIS — M6289 Other specified disorders of muscle: Secondary | ICD-10-CM | POA: Diagnosis not present

## 2020-09-06 DIAGNOSIS — M6289 Other specified disorders of muscle: Secondary | ICD-10-CM | POA: Diagnosis not present

## 2020-09-06 DIAGNOSIS — M62838 Other muscle spasm: Secondary | ICD-10-CM | POA: Diagnosis not present

## 2020-09-06 DIAGNOSIS — K582 Mixed irritable bowel syndrome: Secondary | ICD-10-CM | POA: Diagnosis not present

## 2020-09-06 DIAGNOSIS — Z932 Ileostomy status: Secondary | ICD-10-CM | POA: Diagnosis not present

## 2020-09-12 ENCOUNTER — Encounter: Payer: Self-pay | Admitting: Internal Medicine

## 2020-09-13 MED ORDER — LORAZEPAM 0.5 MG PO TABS: 0.2500 mg | ORAL_TABLET | Freq: Two times a day (BID) | ORAL | 0 refills | Status: DC | PRN

## 2020-09-14 DIAGNOSIS — M62838 Other muscle spasm: Secondary | ICD-10-CM | POA: Diagnosis not present

## 2020-09-14 DIAGNOSIS — K582 Mixed irritable bowel syndrome: Secondary | ICD-10-CM | POA: Diagnosis not present

## 2020-09-14 DIAGNOSIS — M6289 Other specified disorders of muscle: Secondary | ICD-10-CM | POA: Diagnosis not present

## 2020-09-20 DIAGNOSIS — M25561 Pain in right knee: Secondary | ICD-10-CM | POA: Diagnosis not present

## 2020-09-21 ENCOUNTER — Telehealth: Payer: Self-pay | Admitting: Internal Medicine

## 2020-09-21 DIAGNOSIS — M6289 Other specified disorders of muscle: Secondary | ICD-10-CM | POA: Diagnosis not present

## 2020-09-21 DIAGNOSIS — K582 Mixed irritable bowel syndrome: Secondary | ICD-10-CM | POA: Diagnosis not present

## 2020-09-21 DIAGNOSIS — M62838 Other muscle spasm: Secondary | ICD-10-CM | POA: Diagnosis not present

## 2020-09-21 NOTE — Telephone Encounter (Signed)
LVM for pt to rtn my call to schedule AWV with [facility]. Please schedule appt if patient calls the office.

## 2020-09-23 ENCOUNTER — Telehealth: Payer: Self-pay | Admitting: Internal Medicine

## 2020-09-23 DIAGNOSIS — Z20822 Contact with and (suspected) exposure to covid-19: Secondary | ICD-10-CM | POA: Diagnosis not present

## 2020-09-23 NOTE — Telephone Encounter (Signed)
LVM for pt to rtn my call to schedule AWV with [facility]. Please scheudle this appt if pt calls the office.

## 2020-09-26 ENCOUNTER — Other Ambulatory Visit (HOSPITAL_COMMUNITY): Payer: Self-pay | Admitting: Nurse Practitioner

## 2020-09-26 ENCOUNTER — Other Ambulatory Visit (HOSPITAL_COMMUNITY): Payer: Self-pay | Admitting: Family

## 2020-09-26 DIAGNOSIS — U071 COVID-19: Secondary | ICD-10-CM

## 2020-09-26 DIAGNOSIS — Z932 Ileostomy status: Secondary | ICD-10-CM | POA: Diagnosis not present

## 2020-09-26 NOTE — Progress Notes (Signed)
I connected by phone with Erik Lolling Sr. on 09/26/2020 at 6:59 PM to discuss the potential use of a new treatment for mild to moderate COVID-19 viral infection in non-hospitalized patients.  This patient is a 74 y.o. male that meets the FDA criteria for Emergency Use Authorization of COVID monoclonal antibody casirivimab/imdevimab, bamlanivimab/eteseviamb, or sotrovimab.  Has a (+) direct SARS-CoV-2 viral test result  Has mild or moderate COVID-19   Is NOT hospitalized due to COVID-19  Is within 10 days of symptom onset  Has at least one of the high risk factor(s) for progression to severe COVID-19 and/or hospitalization as defined in EUA.  Specific high risk criteria : Older age (>/= 73 yo) and Cardiovascular disease or hypertension   Symptoms cough, congestion, fever, aches, fatigue began 09/21/20.   I have spoken and communicated the following to the patient or parent/caregiver regarding COVID monoclonal antibody treatment:  1. FDA has authorized the emergency use for the treatment of mild to moderate COVID-19 in adults and pediatric patients with positive results of direct SARS-CoV-2 viral testing who are 69 years of age and older weighing at least 40 kg, and who are at high risk for progressing to severe COVID-19 and/or hospitalization.  2. The significant known and potential risks and benefits of COVID monoclonal antibody, and the extent to which such potential risks and benefits are unknown.  3. Information on available alternative treatments and the risks and benefits of those alternatives, including clinical trials.  4. Patients treated with COVID monoclonal antibody should continue to self-isolate and use infection control measures (e.g., wear mask, isolate, social distance, avoid sharing personal items, clean and disinfect "high touch" surfaces, and frequent handwashing) according to CDC guidelines.   5. The patient or parent/caregiver has the option to accept or refuse  COVID monoclonal antibody treatment.  After reviewing this information with the patient, the patient has agreed to receive one of the available covid 19 monoclonal antibodies and will be provided an appropriate fact sheet prior to infusion. Asencion Gowda, NP 09/26/2020 6:59 PM

## 2020-09-27 ENCOUNTER — Telehealth (INDEPENDENT_AMBULATORY_CARE_PROVIDER_SITE_OTHER): Payer: Medicare Other | Admitting: Internal Medicine

## 2020-09-27 ENCOUNTER — Ambulatory Visit (HOSPITAL_COMMUNITY)
Admission: RE | Admit: 2020-09-27 | Discharge: 2020-09-27 | Disposition: A | Discharge status: Home / Self Care | Payer: Medicare Other | Source: Ambulatory Visit | Attending: Pulmonary Disease | Admitting: Pulmonary Disease

## 2020-09-27 ENCOUNTER — Ambulatory Visit: Payer: Medicare Other | Admitting: Internal Medicine

## 2020-09-27 DIAGNOSIS — R739 Hyperglycemia, unspecified: Secondary | ICD-10-CM | POA: Diagnosis not present

## 2020-09-27 DIAGNOSIS — U071 COVID-19: Secondary | ICD-10-CM

## 2020-09-27 DIAGNOSIS — Z23 Encounter for immunization: Secondary | ICD-10-CM | POA: Insufficient documentation

## 2020-09-27 DIAGNOSIS — F419 Anxiety disorder, unspecified: Secondary | ICD-10-CM | POA: Diagnosis not present

## 2020-09-27 MED ORDER — FAMOTIDINE IN NACL 20-0.9 MG/50ML-% IV SOLN: 20.0000 mg | Freq: Once | INTRAVENOUS | Status: DC | PRN

## 2020-09-27 MED ORDER — ONDANSETRON 4 MG PO TBDP: 4.0000 mg | ORAL_TABLET | Freq: Three times a day (TID) | ORAL | 0 refills | Status: DC | PRN

## 2020-09-27 MED ORDER — DIPHENHYDRAMINE HCL 50 MG/ML IJ SOLN: 50.0000 mg | Freq: Once | INTRAMUSCULAR | Status: DC | PRN

## 2020-09-27 MED ORDER — DIPHENOXYLATE-ATROPINE 2.5-0.025 MG PO TABS: 1.0000 | ORAL_TABLET | Freq: Four times a day (QID) | ORAL | 0 refills | Status: DC | PRN

## 2020-09-27 MED ORDER — LORAZEPAM 0.5 MG PO TABS: 0.2500 mg | ORAL_TABLET | Freq: Two times a day (BID) | ORAL | 0 refills | Status: DC | PRN

## 2020-09-27 MED ORDER — EPINEPHRINE 0.3 MG/0.3ML IJ SOAJ: 0.3000 mg | Freq: Once | INTRAMUSCULAR | Status: DC | PRN

## 2020-09-27 MED ORDER — METHYLPREDNISOLONE SODIUM SUCC 125 MG IJ SOLR: 125.0000 mg | Freq: Once | INTRAMUSCULAR | Status: DC | PRN

## 2020-09-27 MED ORDER — SODIUM CHLORIDE 0.9 % IV SOLN: INTRAVENOUS | Status: DC | PRN

## 2020-09-27 MED ORDER — ALBUTEROL SULFATE HFA 108 (90 BASE) MCG/ACT IN AERS: 2.0000 | INHALATION_SPRAY | Freq: Once | RESPIRATORY_TRACT | Status: DC | PRN

## 2020-09-27 MED ORDER — SOTROVIMAB 500 MG/8ML IV SOLN
500.0000 mg | Freq: Once | INTRAVENOUS | Status: AC
  Administered 2020-09-27: 500 mg via INTRAVENOUS

## 2020-09-27 NOTE — Progress Notes (Signed)
Diagnosis: COVID-19  Physician: Dr. Patrick Wright  Procedure: [facility] Infusion [facility] Med: Sotrovimab infusion - Provided patient with sotrovimab fact sheet for patients, parents, and caregivers prior to infusion.   Complications: No immediate complications noted  Discharge: Discharged home    

## 2020-09-27 NOTE — Discharge Instructions (Signed)

## 2020-09-27 NOTE — Patient Instructions (Signed)
Please take all new medication as prescribed  Please have your wife contact Dr Quay Burow to consider SubQ monoclonal Ab

## 2020-09-27 NOTE — Progress Notes (Signed)
Patient ID: Erik Lolling Sr., male   DOB: [birth year], 74 y.o.   MRN: [id number]  Virtual Visit via Video Note  I connected with Erik Lolling Sr. on 09/27/20 at  3:40 PM EST by a video enabled telemedicine application and verified that I am speaking with the correct person using two identifiers.  Location of all participants today Patient: at home Provider: at office   I discussed the limitations of evaluation and management by telemedicine and the availability of in person appointments. The patient expressed understanding and agreed to proceed.  History of Present Illness: Here after nov 11 uri syptoms onset, then + Covid testing Nov 15 with symptoms primarily chills, nausea, diarrhea, joint pain and minor non productive cough. Denies worsening reflux, abd pain, dysphagia, vomiting,or blood.  Pt denies polydipsia, polyuria,  Still feels rough overall but did have the monoclonal Ab tx today.  No other new complaints.  Wife has been exposed but not known to be positive.  Pt is non vaccinated Past Medical History:  Diagnosis Date  . Allergic rhinitis   . Allergy   . Anal fissure   . Anxiety   . Cataract    starting  . Coronary atherosclerosis of native coronary artery 2007   nonobstructive CAD by cath with 40% mid-LAD stenosis  . Degenerative arthritis of knee, bilateral 10/09/2017  . Depression   . Diverticulosis 12/18/2011  . Diverticulosis of colon with hemorrhage    April 2021, [facility]  . Fibromyalgia   . GERD with stricture   . Helicobacter pylori (H. pylori) infection 11/25/2012   11/2012 EGD + gastric bxs  . Hiatal hernia   . HLD (hyperlipidemia)   . HTN (hypertension)   . Hyperlipidemia   . IBS (irritable bowel syndrome)   . Impotence of organic origin   . Insomnia   . Internal and external hemorrhoids without complication   . Interstitial cystitis   . Irritable bowel syndrome 06/09/2010   Qualifier: Diagnosis of  By: Carlean Purl MD, Tonna Boehringer E   . 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 Reactive hypoglycemia   . Rheumatoid arthritis(714.0)   . 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 Vitamin D deficiency 11/29/2017   Past Surgical History:  Procedure Laterality Date  . bladder distention     x 6  . CATHETER REMOVAL     super pubic area  . COLONOSCOPY    . DENTAL SURGERY Right    #30 tooth extraction (right upper)  . INTERSTIM IMPLANT PLACEMENT    . INTERSTIM IMPLANT REMOVAL    . KNEE ARTHROSCOPY     right x 2  . NISSEN FUNDOPLICATION  4098  . SHOULDER ARTHROSCOPY  2010   left  . TONSILLECTOMY AND ADENOIDECTOMY  1957  . TRANSURETHRAL RESECTION OF PROSTATE     x 2  . UPPER GASTROINTESTINAL ENDOSCOPY  05/13/2008   hiatal hernia  . VASECTOMY      reports that he has quit smoking. He has never used smokeless tobacco. He reports that he does not drink alcohol and does not use drugs. family history includes Breast cancer in his sister; Colon polyps in his father; Diabetes in his father, paternal uncle, and sister; Heart disease in his father and mother; Hypertension in his father and mother; Stroke in his mother. Allergies  Allergen Reactions  . Ambien [Zolpidem]   . Bentyl [Dicyclomine]     Memory loss, anxiety, blurred vision  . Lexapro [Escitalopram Oxalate]   . Pneumovax [Pneumococcal Polysaccharide Vaccine]  Other (See Comments)    pain  . Prilosec [Omeprazole] Other (See Comments)    Per patient joint pain with PPI's  . Remeron [Mirtazapine]   . Statins Other (See Comments)    Joint pain  . Tramadol Other (See Comments)  . Tylenol [Acetaminophen]    Current Outpatient Medications on File Prior to Visit  Medication Sig Dispense Refill  . aspirin 81 MG chewable tablet Chew by mouth daily.    . Cholecalciferol 125 MCG (5000 UT) TABS Take by mouth.    . cyclobenzaprine (FLEXERIL) 5 MG tablet Take 5 mg by mouth 2 (two) times daily.    . famotidine (PEPCID) 40 MG tablet Take 1 tablet (40 mg total) by mouth 2 (two) times daily. 180 tablet 3   . hydrocortisone cream 1 % Apply topically.    . hydrOXYzine (ATARAX/VISTARIL) 10 MG tablet Take 10 mg by mouth at bedtime.    . hydrOXYzine (ATARAX/VISTARIL) 25 MG tablet Take 25 mg by mouth 3 (three) times daily as needed. 1/2 tablet qhs prn    . MELATONIN PO Take by mouth.    . traMADol (ULTRAM) 50 MG tablet TAKE 1 TABLET(50 MG) BY MOUTH EVERY 6 HOURS AS NEEDED 120 tablet 0   No current facility-administered medications on file prior to visit.    Observations/Objective: Alert, NAD, appropriate mood and affect, resps normal, cn 2-12 intact, moves all 4s, no visible rash or swelling Lab Results  Component Value Date   WBC 10.2 07/06/2020   HGB 13.2 07/06/2020   HCT 40.4 07/06/2020   PLT 355.0 07/06/2020   GLUCOSE 160 (H) 03/22/2020   CHOL 163 06/30/2019   TRIG 132.0 06/30/2019   HDL 37.20 (L) 06/30/2019   LDLDIRECT 169.0 06/24/2018   LDLCALC 100 (H) 06/30/2019   ALT 11 06/30/2019   AST 10 06/30/2019   NA 135 03/22/2020   K 3.9 03/22/2020   CL 103 03/22/2020   CREATININE 1.33 03/22/2020   BUN 18 03/22/2020   CO2 24 03/22/2020   TSH 1.35 06/30/2019   PSA 0.00 (L) 06/30/2019   HGBA1C 5.9 06/30/2019   Assessment and Plan: See notes  Follow Up Instructions: See notes   I discussed the assessment and treatment plan with the patient. The patient was provided an opportunity to ask questions and all were answered. The patient agreed with the plan and demonstrated an understanding of the instructions.   The patient was advised to call back or seek an in-person evaluation if the symptoms worsen or if the condition fails to improve as anticipated.  Cathlean Cower, MD

## 2020-09-29 ENCOUNTER — Encounter: Payer: Self-pay | Admitting: Internal Medicine

## 2020-10-02 ENCOUNTER — Encounter: Payer: Self-pay | Admitting: Internal Medicine

## 2020-10-02 DIAGNOSIS — U071 COVID-19: Secondary | ICD-10-CM | POA: Insufficient documentation

## 2020-10-02 NOTE — Assessment & Plan Note (Signed)
Chronic persistent without panic, for ativan refill,  to f/u any worsening symptoms or concerns

## 2020-10-02 NOTE — Assessment & Plan Note (Signed)
stable overall by history and exam, recent data reviewed with pt, and pt to continue medical treatment as before,  to f/u any worsening symptoms or concerns  

## 2020-10-02 NOTE — Assessment & Plan Note (Addendum)
Now s/p monoclonal Ab tx today and I advised he will likely feel some improved tomorrow, in the meantime ok for symptomatic meds including zofran prn, lomotil prn, and consider monoclonal Ab subq tx for wife  I spent 31 minutes in preparing to see the patient by review of recent labs, imaging and procedures, obtaining and reviewing separately obtained history, communicating with the patient and family or caregiver, ordering medications, tests or procedures, and documenting clinical information in the EHR including the differential Dx, treatment, and any further evaluation and other management of covid infection, anxiety, hyperglycemia

## 2020-10-18 ENCOUNTER — Telehealth (INDEPENDENT_AMBULATORY_CARE_PROVIDER_SITE_OTHER): Payer: Medicare Other

## 2020-10-18 DIAGNOSIS — Z0000 Encounter for general adult medical examination without abnormal findings: Secondary | ICD-10-CM | POA: Diagnosis not present

## 2020-10-18 NOTE — Patient Instructions (Signed)
[name] , Thank you for taking time to come for your Medicare Wellness Visit. I appreciate your ongoing commitment to your health goals. Please review the following plan we discussed and let me know if I can assist you in the future.   Screening recommendations/referrals: Colonoscopy: 02/2020 at [facility] Recommended yearly ophthalmology/optometry visit for glaucoma screening and checkup Recommended yearly dental visit for hygiene and checkup  Vaccinations: Influenza vaccine: declined Pneumococcal vaccine: declined Tdap vaccine: declined Shingles vaccine: declined   Covid-19: up to date with injection and infusion  Advanced directives: Please bring a copy of your health care power of attorney and living will to the office at your convenience.  Conditions/risks identified: Yes; Reviewed health maintenance screenings with patient today and relevant education, vaccines, and/or referrals were provided. Please continue to do your personal lifestyle choices by: daily care of teeth and gums, regular physical activity (goal should be 5 days a week for 30 minutes), eat a healthy diet, avoid tobacco and drug use, limiting any alcohol intake, taking a low-dose aspirin (if not allergic or have been advised by your provider otherwise) and taking vitamins and minerals as recommended by your provider. Continue doing brain stimulating activities (puzzles, reading, adult coloring books, staying active) to keep memory sharp. Continue to eat heart healthy diet (full of fruits, vegetables, whole grains, lean protein, water--limit salt, fat, and sugar intake) and increase physical activity as tolerated.  Next appointment: Please schedule your next Medicare Wellness Visit with your Nurse Health Advisor in 1 year by calling [phone].  Preventive Care 46 Years and Older, Male Preventive care refers to lifestyle choices and visits with your health care provider that can promote health and wellness. What does  preventive care include?  A yearly physical exam. This is also called an annual well check.  Dental exams once or twice a year.  Routine eye exams. Ask your health care provider how often you should have your eyes checked.  Personal lifestyle choices, including:  Daily care of your teeth and gums.  Regular physical activity.  Eating a healthy diet.  Avoiding tobacco and drug use.  Limiting alcohol use.  Practicing safe sex.  Taking low doses of aspirin every day.  Taking vitamin and mineral supplements as recommended by your health care provider. What happens during an annual well check? The services and screenings done by your health care provider during your annual well check will depend on your age, overall health, lifestyle risk factors, and family history of disease. Counseling  Your health care provider may ask you questions about your:  Alcohol use.  Tobacco use.  Drug use.  Emotional well-being.  Home and relationship well-being.  Sexual activity.  Eating habits.  History of falls.  Memory and ability to understand (cognition).  Work and work Statistician. Screening  You may have the following tests or measurements:  Height, weight, and BMI.  Blood pressure.  Lipid and cholesterol levels. These may be checked every 5 years, or more frequently if you are over 32 years old.  Skin check.  Lung cancer screening. You may have this screening every year starting at age 73 if you have a 30-pack-year history of smoking and currently smoke or have quit within the past 15 years.  Fecal occult blood test (FOBT) of the stool. You may have this test every year starting at age 40.  Flexible sigmoidoscopy or colonoscopy. You may have a sigmoidoscopy every 5 years or a colonoscopy every 10 years starting at age 73.  Prostate cancer screening. Recommendations will vary depending on your family history and other risks.  Hepatitis C blood test.  Hepatitis B  blood test.  Sexually transmitted disease (STD) testing.  Diabetes screening. This is done by checking your blood sugar (glucose) after you have not eaten for a while (fasting). You may have this done every 1-3 years.  Abdominal aortic aneurysm (AAA) screening. You may need this if you are a current or former smoker.  Osteoporosis. You may be screened starting at age 17 if you are at high risk. Talk with your health care provider about your test results, treatment options, and if necessary, the need for more tests. Vaccines  Your health care provider may recommend certain vaccines, such as:  Influenza vaccine. This is recommended every year.  Tetanus, diphtheria, and acellular pertussis (Tdap, Td) vaccine. You may need a Td booster every 10 years.  Zoster vaccine. You may need this after age 16.  Pneumococcal 13-valent conjugate (PCV13) vaccine. One dose is recommended after age 77.  Pneumococcal polysaccharide (PPSV23) vaccine. One dose is recommended after age 73. Talk to your health care provider about which screenings and vaccines you need and how often you need them. This information is not intended to replace advice given to you by your health care provider. Make sure you discuss any questions you have with your health care provider. Document Released: 11/25/2015 Document Revised: 07/18/2016 Document Reviewed: 08/30/2015 Elsevier Interactive Patient Education  2017 [place] Prevention in the Home Falls can cause injuries. They can happen to people of all ages. There are many things you can do to make your home safe and to help prevent falls. What can I do on the outside of my home?  Regularly fix the edges of walkways and driveways and fix any cracks.  Remove anything that might make you trip as you walk through a door, such as a raised step or threshold.  Trim any bushes or trees on the path to your home.  Use bright outdoor lighting.  Clear any walking paths  of anything that might make someone trip, such as rocks or tools.  Regularly check to see if handrails are loose or broken. Make sure that both sides of any steps have handrails.  Any raised decks and porches should have guardrails on the edges.  Have any leaves, snow, or ice cleared regularly.  Use sand or salt on walking paths during winter.  Clean up any spills in your garage right away. This includes oil or grease spills. What can I do in the bathroom?  Use night lights.  Install grab bars by the toilet and in the tub and shower. Do not use towel bars as grab bars.  Use non-skid mats or decals in the tub or shower.  If you need to sit down in the shower, use a plastic, non-slip stool.  Keep the floor dry. Clean up any water that spills on the floor as soon as it happens.  Remove soap buildup in the tub or shower regularly.  Attach bath mats securely with double-sided non-slip rug tape.  Do not have throw rugs and other things on the floor that can make you trip. What can I do in the bedroom?  Use night lights.  Make sure that you have a light by your bed that is easy to reach.  Do not use any sheets or blankets that are too big for your bed. They should not hang down onto the floor.  Have  a firm chair that has side arms. You can use this for support while you get dressed.  Do not have throw rugs and other things on the floor that can make you trip. What can I do in the kitchen?  Clean up any spills right away.  Avoid walking on wet floors.  Keep items that you use a lot in easy-to-reach places.  If you need to reach something above you, use a strong step stool that has a grab bar.  Keep electrical cords out of the way.  Do not use floor polish or wax that makes floors slippery. If you must use wax, use non-skid floor wax.  Do not have throw rugs and other things on the floor that can make you trip. What can I do with my stairs?  Do not leave any items on  the stairs.  Make sure that there are handrails on both sides of the stairs and use them. Fix handrails that are broken or loose. Make sure that handrails are as long as the stairways.  Check any carpeting to make sure that it is firmly attached to the stairs. Fix any carpet that is loose or worn.  Avoid having throw rugs at the top or bottom of the stairs. If you do have throw rugs, attach them to the floor with carpet tape.  Make sure that you have a light switch at the top of the stairs and the bottom of the stairs. If you do not have them, ask someone to add them for you. What else can I do to help prevent falls?  Wear shoes that:  Do not have high heels.  Have rubber bottoms.  Are comfortable and fit you well.  Are closed at the toe. Do not wear sandals.  If you use a stepladder:  Make sure that it is fully opened. Do not climb a closed stepladder.  Make sure that both sides of the stepladder are locked into place.  Ask someone to hold it for you, if possible.  Clearly mark and make sure that you can see:  Any grab bars or handrails.  First and last steps.  Where the edge of each step is.  Use tools that help you move around (mobility aids) if they are needed. These include:  Canes.  Walkers.  Scooters.  Crutches.  Turn on the lights when you go into a dark area. Replace any light bulbs as soon as they burn out.  Set up your furniture so you have a clear path. Avoid moving your furniture around.  If any of your floors are uneven, fix them.  If there are any pets around you, be aware of where they are.  Review your medicines with your doctor. Some medicines can make you feel dizzy. This can increase your chance of falling. Ask your doctor what other things that you can do to help prevent falls. This information is not intended to replace advice given to you by your health care provider. Make sure you discuss any questions you have with your health care  provider. Document Released: 08/25/2009 Document Revised: 04/05/2016 Document Reviewed: 12/03/2014 Elsevier Interactive Patient Education  2017 [employer].

## 2020-10-18 NOTE — Progress Notes (Signed)
I connected with Erik Pro Sr. today at by telephone and verified that I am speaking with the correct person using two identifiers. Location patient: home Location provider: work Persons participating in the virtual visit: Chip Canepa Sr and Erik Abu, [name].   I discussed the limitations, risks, security and privacy concerns of performing an evaluation and management service by telephone and the availability of in person appointments.  I also discussed with the patient that there may be a patient responsible charge related to this service.  The patient expressed understanding and agreed to proceed.   Interactive audio and video telecommunications were attempted between this provider and patient, however failed, due to patient having technical difficulties OR patient did not have access to video capability.   We continued and completed visit with audio only.  Subjective:   Erik Lolling Sr. is a 74 y.o. male who presents for Medicare Annual/Subsequent preventive examination.  Review of Systems    No ROS. Medicare Wellness Visit. Additional risk factors are reflected in social history. Cardiac Risk Factors include: advanced age (>71men, >82 women);family history of premature cardiovascular disease;dyslipidemia;hypertension;male gender     Objective:    There were no vitals filed for this visit. There is no height or weight on file to calculate BMI.  Advanced Directives 10/18/2020 06/20/2017 03/26/2017 11/23/2015 11/15/2015 03/21/2015  Does Patient Have a Medical Advance Directive? Yes Yes Yes Yes No No  Type of Paramedic of [place];Living will [place];Living will Living will [place];Living will - -  Does patient want to make changes to medical advance directive? No - Patient declined - - - - -  Copy of [place] in Chart? - No - copy requested - - - -  Would patient like information on creating a  medical advance directive? - - No - Patient declined - - -    Current Medications (verified) Outpatient Encounter Medications as of 10/18/2020  Medication Sig  . Cholecalciferol 125 MCG (5000 UT) TABS Take by mouth.  . cyclobenzaprine (FLEXERIL) 5 MG tablet Take 5 mg by mouth 2 (two) times daily.  . diphenoxylate-atropine (LOMOTIL) 2.5-0.025 MG tablet Take 1 tablet by mouth 4 (four) times daily as needed for diarrhea or loose stools.  . famotidine (PEPCID) 40 MG tablet Take 1 tablet (40 mg total) by mouth 2 (two) times daily.  . hydrocortisone cream 1 % Apply topically.  . hydrOXYzine (ATARAX/VISTARIL) 10 MG tablet Take 10 mg by mouth at bedtime.  Marland Kitchen LORazepam (ATIVAN) 0.5 MG tablet Take 0.5 tablets (0.25 mg total) by mouth 2 (two) times daily as needed for anxiety.  . ondansetron (ZOFRAN ODT) 4 MG disintegrating tablet Take 1 tablet (4 mg total) by mouth every 8 (eight) hours as needed for nausea or vomiting.  . Suvorexant 10 MG TABS Take by mouth.  . traMADol (ULTRAM) 50 MG tablet TAKE 1 TABLET(50 MG) BY MOUTH EVERY 6 HOURS AS NEEDED  . aspirin 81 MG chewable tablet Chew by mouth daily. (Patient not taking: Reported on 10/18/2020)  . hydrOXYzine (ATARAX/VISTARIL) 25 MG tablet Take 25 mg by mouth 3 (three) times daily as needed. 1/2 tablet qhs prn (Patient not taking: Reported on 10/18/2020)  . MELATONIN PO Take by mouth. (Patient not taking: Reported on 10/18/2020)   No facility-administered encounter medications on file as of 10/18/2020.    Allergies (verified) Ambien [zolpidem], Bentyl [dicyclomine], Lexapro [escitalopram oxalate], Pneumovax [pneumococcal polysaccharide vaccine], Prilosec [omeprazole], Remeron [mirtazapine], Statins,  Tramadol, and Tylenol [acetaminophen]   History: Past Medical History:  Diagnosis Date  . Allergic rhinitis   . Allergy   . Anal fissure   . Anxiety   . Cataract    starting  . Coronary atherosclerosis of native coronary artery 2007   nonobstructive CAD  by cath with 40% mid-LAD stenosis  . Degenerative arthritis of knee, bilateral 10/09/2017  . Depression   . Diverticulosis 12/18/2011  . Diverticulosis of colon with hemorrhage    April 2021, [facility]  . Fibromyalgia   . GERD with stricture   . Helicobacter pylori (H. pylori) infection 11/25/2012   11/2012 EGD + gastric bxs  . Hiatal hernia   . HLD (hyperlipidemia)   . HTN (hypertension)   . Hyperlipidemia   . IBS (irritable bowel syndrome)   . Impotence of organic origin   . Insomnia   . Internal and external hemorrhoids without complication   . Interstitial cystitis   . Irritable bowel syndrome 06/09/2010   Qualifier: Diagnosis of  By: Carlean Purl MD, Tonna Boehringer E   . Pelvic floor dysfunction 11/29/2017  . Reactive hypoglycemia   . Rheumatoid arthritis(714.0)   . 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 Vitamin D deficiency 11/29/2017   Past Surgical History:  Procedure Laterality Date  . bladder distention     x 6  . CATHETER REMOVAL     super pubic area  . COLONOSCOPY    . DENTAL SURGERY Right    #30 tooth extraction (right upper)  . INTERSTIM IMPLANT PLACEMENT    . INTERSTIM IMPLANT REMOVAL    . KNEE ARTHROSCOPY     right x 2  . NISSEN FUNDOPLICATION  9767  . SHOULDER ARTHROSCOPY  2010   left  . TONSILLECTOMY AND ADENOIDECTOMY  1957  . TRANSURETHRAL RESECTION OF PROSTATE     x 2  . UPPER GASTROINTESTINAL ENDOSCOPY  05/13/2008   hiatal hernia  . VASECTOMY     Family History  Problem Relation Age of Onset  . Colon polyps Father   . Diabetes Father   . Heart disease Father   . Hypertension Father   . Heart disease Mother   . Hypertension Mother   . Stroke Mother   . Breast cancer Sister   . Diabetes Sister   . Diabetes Paternal Uncle   . Colon cancer Neg Hx   . Esophageal cancer Neg Hx   . Rectal cancer Neg Hx   . Stomach cancer Neg Hx    Social History   Socioeconomic History  . Marital status: Married    Spouse name: Not on file  . Number of children: 4    . Years of education: Not on file  . Highest education level: Not on file  Occupational History  . Occupation: retired  Tobacco Use  . Smoking status: Former Research scientist (life sciences)  . Smokeless tobacco: Never Used  . Tobacco comment: quit 1980  Vaping Use  . Vaping Use: Never used  Substance and Sexual Activity  . Alcohol use: No    Alcohol/week: 0.0 standard drinks  . Drug use: No  . Sexual activity: Yes    Partners: Female  Other Topics Concern  . Not on file  Social History Narrative   Married, retired for children   He is a former smoker no alcohol or substance use   Social Determinants of Radio broadcast assistant Strain: Low Risk   . Difficulty of Paying Living Expenses: Not hard at all  Food Insecurity: No  Food Insecurity  . Worried About Charity fundraiser in the Last Year: Never true  . Ran Out of Food in the Last Year: Never true  Transportation Needs: No Transportation Needs  . Lack of Transportation (Medical): No  . Lack of Transportation (Non-Medical): No  Physical Activity: Inactive  . Days of Exercise per Week: 0 days  . Minutes of Exercise per Session: 0 min  Stress: No Stress Concern Present  . Feeling of Stress : Not at all  Social Connections:   . Frequency of Communication with Friends and Family: Not on file  . Frequency of Social Gatherings with Friends and Family: Not on file  . Attends Religious Services: Not on file  . Active Member of Clubs or Organizations: Not on file  . Attends Archivist Meetings: Not on file  . Marital Status: Not on file    Tobacco Counseling Counseling given: Not Answered Comment: quit 1980   Clinical Intake:  Pre-visit preparation completed: Yes  Pain : No/denies pain     Nutritional Risks: None Diabetes: No  How often do you need to have someone help you when you read instructions, pamphlets, or other written materials from your doctor or pharmacy?: 1 - Never What is the last grade level you completed in  school?: GED  Diabetic? no  Interpreter Needed?: No  Information entered by :: Erik Abu, [name]   Activities of Daily Living In your present state of health, do you have any difficulty performing the following activities: 10/18/2020  Hearing? N  Vision? N  Difficulty concentrating or making decisions? Y  Comment due to COVID  Walking or climbing stairs? N  Dressing or bathing? N  Doing errands, shopping? N  Preparing Food and eating ? N  Using the Toilet? N  In the past six months, have you accidently leaked urine? N  Do you have problems with loss of bowel control? N  Managing your Medications? N  Managing your Finances? N  Housekeeping or managing your Housekeeping? N  Some recent data might be hidden    Patient Care Team: [name], MD as PCP - General (Internal Medicine) [name], MD as Attending Physician (Internal Medicine) Domingo Pulse, MD (Urology)  Indicate any recent Medical Services you may have received from other than [facility] providers in the past year (date may be approximate).     Assessment:   This is a routine wellness examination for Jaise.  Hearing/Vision screen No exam data present  Dietary issues and exercise activities discussed: Current Exercise Habits: The patient does not participate in regular exercise at present, Exercise limited by: Other - see comments (recovering from [place])  Goals    . Exercise 150 minutes per week (moderate activity)     In general to be more active; Getting his sleep now and having energy;  Has been in aquatics in the past and may try again.  Start date Sept 1     . Increase my physical activity     Continue to stretch and walk as much as possible.      Depression Screen PHQ 2/9 Scores 10/18/2020 12/14/2019 06/30/2019 06/24/2018 06/24/2018 04/10/2018 10/09/2017  PHQ - 2 Score 1 3 1 3 1 1  0  PHQ- 9 Score - 9 - 14 - - 0    Fall Risk Fall Risk  10/18/2020 03/03/2020 12/14/2019 06/30/2019 06/24/2018  Falls  in the past year? 0 1 0 0 No  Number falls in past yr:  0 0 - - -  Injury with Fall? 0 0 - - -  Risk for fall due to : No Fall Risks No Fall Risks - - -  Follow up Falls evaluation completed Falls evaluation completed - - -    FALL RISK PREVENTION PERTAINING TO THE HOME:  Any stairs in or around the home? No  If so, are there any without handrails? No  Home free of loose throw rugs in walkways, pet beds, electrical cords, etc? Yes  Adequate lighting in your home to reduce risk of falls? Yes   ASSISTIVE DEVICES UTILIZED TO PREVENT FALLS:  Life alert? No  Use of a cane, walker or w/c? No  Grab bars in the bathroom? No  Shower chair or bench in shower? Yes  Elevated toilet seat or a handicapped toilet? Yes   TIMED UP AND GO:  Was the test performed? No .  Length of time to ambulate 10 feet: 0 sec.   Gait steady and fast without use of assistive device (per patient)  Cognitive Function: Patient is cogitatively intact.        Immunizations Immunization History  Administered Date(s) Administered  . Pneumococcal Polysaccharide-23 07/03/2013  . Pneumococcal-Unspecified 11/12/2012    TDAP status: Due, Education has been provided regarding the importance of this vaccine. Advised may receive this vaccine at local pharmacy or Health Dept. Aware to provide a copy of the vaccination record if obtained from local pharmacy or Health Dept. Verbalized acceptance and understanding.  Flu Vaccine status: Declined, Education has been provided regarding the importance of this vaccine but patient still declined. Advised may receive this vaccine at local pharmacy or Health Dept. Aware to provide a copy of the vaccination record if obtained from local pharmacy or Health Dept. Verbalized acceptance and understanding.  Pneumococcal vaccine status: Declined,  Education has been provided regarding the importance of this vaccine but patient still declined. Advised may receive this vaccine at local  pharmacy or Health Dept. Aware to provide a copy of the vaccination record if obtained from local pharmacy or Health Dept. Verbalized acceptance and understanding.   Covid-19 vaccine status: Completed vaccines  Qualifies for Shingles Vaccine? Yes   Zostavax completed No   Shingrix Completed?: No.    Education has been provided regarding the importance of this vaccine. Patient has been advised to call insurance company to determine out of pocket expense if they have not yet received this vaccine. Advised may also receive vaccine at local pharmacy or Health Dept. Verbalized acceptance and understanding.  Screening Tests Health Maintenance  Topic Date Due  . COVID-19 Vaccine (1) Never done  . PNA vac Low Risk Adult (2 of 2 - PCV13) 07/03/2014  . INFLUENZA VACCINE  Never done  . TETANUS/TDAP  11/12/2020  . COLONOSCOPY  11/22/2025  . Hepatitis C Screening  Completed    Health Maintenance  Health Maintenance Due  Topic Date Due  . COVID-19 Vaccine (1) Never done  . PNA vac Low Risk Adult (2 of 2 - PCV13) 07/03/2014  . INFLUENZA VACCINE  Never done    Colorectal cancer screening: Type of screening: Colonoscopy. Completed 02/2020. Repeat every 0 years  (per patient; stated done at [facility])  Lung Cancer Screening: (Low Dose CT Chest recommended if Age 3-80 years, 30 pack-year currently smoking OR have quit w/in 15years.) does not qualify.   Lung Cancer Screening Referral: no  Additional Screening:  Hepatitis C Screening: does qualify; Completed yes  Vision Screening: Recommended annual ophthalmology exams  for early detection of glaucoma and other disorders of the eye. Is the patient up to date with their annual eye exam?  Yes  Who is the provider or what is the name of the office in which the patient attends annual eye exams? Ray A. Leonia Corona, OD. If pt is not established with a provider, would they like to be referred to a provider to establish care? No .   Dental  Screening: Recommended annual dental exams for proper oral hygiene  Community Resource Referral / Chronic Care Management: CRR required this visit?  No   CCM required this visit?  No      Plan:     I have personally reviewed and noted the following in the patient's chart:   . Medical and social history . Use of alcohol, tobacco or illicit drugs  . Current medications and supplements . Functional ability and status . Nutritional status . Physical activity . Advanced directives . List of other physicians . Hospitalizations, surgeries, and ER visits in previous 12 months . Vitals . Screenings to include cognitive, depression, and falls . Referrals and appointments  In addition, I have reviewed and discussed with patient certain preventive protocols, quality metrics, and best practice recommendations. A written personalized care plan for preventive services as well as general preventive health recommendations were provided to patient.     Sheral Flow, [name]   [id number]   Nurse Notes:  Patient is cogitatively intact. There were no vitals filed for this visit. There is no height or weight on file to calculate BMI. Patient stated that he has no issues with gait or balance; does not use any assistive devices.

## 2020-10-26 DIAGNOSIS — M6289 Other specified disorders of muscle: Secondary | ICD-10-CM | POA: Diagnosis not present

## 2020-10-26 DIAGNOSIS — M62838 Other muscle spasm: Secondary | ICD-10-CM | POA: Diagnosis not present

## 2020-10-26 DIAGNOSIS — K582 Mixed irritable bowel syndrome: Secondary | ICD-10-CM | POA: Diagnosis not present

## 2020-10-27 DIAGNOSIS — Z9181 History of falling: Secondary | ICD-10-CM | POA: Diagnosis not present

## 2020-10-27 DIAGNOSIS — E785 Hyperlipidemia, unspecified: Secondary | ICD-10-CM | POA: Diagnosis not present

## 2020-10-27 DIAGNOSIS — Z932 Ileostomy status: Secondary | ICD-10-CM | POA: Diagnosis not present

## 2020-10-27 DIAGNOSIS — Z0000 Encounter for general adult medical examination without abnormal findings: Secondary | ICD-10-CM | POA: Diagnosis not present

## 2020-10-31 DIAGNOSIS — Z932 Ileostomy status: Secondary | ICD-10-CM | POA: Diagnosis not present

## 2020-11-07 ENCOUNTER — Telehealth: Payer: Self-pay | Admitting: Internal Medicine

## 2020-11-07 NOTE — Telephone Encounter (Signed)
Patient called and said that he has been experiencing light headedness, nausea, confusion, and fatigue. Transferred to [facility].

## 2020-11-07 NOTE — Telephone Encounter (Signed)
[facility] FYI   Caller states he has nausea, light headedness, confusion and fatigue. Caller was dx with Covid on Nov the 9th, advised that the symptoms have been lingering. Advised that he fills a little confused. No temp. Nauseated and dizzy at this time. Advised that it comes and goes. Two Covid vaccines.  [facility] advised: See PCP within 24 Hours   Patient understood and complied. Called back with his o2 sat which was 97% but was not able to check his blood sugar.   Patient scheduled for my-chart visit on 11/08/20.

## 2020-11-08 ENCOUNTER — Other Ambulatory Visit: Payer: Self-pay

## 2020-11-08 ENCOUNTER — Telehealth (INDEPENDENT_AMBULATORY_CARE_PROVIDER_SITE_OTHER): Payer: Medicare Other | Admitting: Family Medicine

## 2020-11-08 ENCOUNTER — Telehealth: Payer: Medicare Other | Admitting: Internal Medicine

## 2020-11-08 DIAGNOSIS — R42 Dizziness and giddiness: Secondary | ICD-10-CM

## 2020-11-08 DIAGNOSIS — R110 Nausea: Secondary | ICD-10-CM

## 2020-11-08 DIAGNOSIS — R5383 Other fatigue: Secondary | ICD-10-CM | POA: Diagnosis not present

## 2020-11-08 NOTE — Progress Notes (Signed)
Virtual Visit via Telephone Note  I connected with Erik Cola Sr. on 11/08/20 at  3:00 PM EST by telephone and verified that I am speaking with the correct person using two identifiers.   I discussed the limitations, risks, security and privacy concerns of performing an evaluation and management service by telephone and the availability of in person appointments. I also discussed with the patient that there may be a patient responsible charge related to this service. The patient expressed understanding and agreed to proceed.  Location patient: home, [place] Location provider: work or home office Participants present for the call: patient, provider Patient did not have a visit with me in the prior 7 days to address this/these issue(s).   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 I do not appreciate any SOB. Speech and thought processing are grossly intact. Patient reported vitals:  Assessment and Plan:  Other fatigue  Lightheaded  Nausea  -we discussed possible serious and likely etiologies, options for evaluation and workup, limitations of telemedicine visit vs in person visit, treatment, treatment risks and precautions. Pt prefers to treat via telemedicine empirically rather than in person at  this moment.  Discussed that while his symptoms could be long-haul symptoms from Covid, other potential etiologies would need to be ruled out.  He has not had an in person evaluation with his primary doctor or his specialist since he started.  Recommended that the best course of action would be to schedule an in person visit with his PCP for a good exam and likely some lab work to start with.  Also recommended given his history of gastric hemorrhage that he touch base with his gastroenterologist as well for the nausea and the reported black stools -those could have been from the [facility] and he is having none currently, but still advised to follow-up with his gastroenterologist.  He agrees to call his PCP and his gastroenterologist arrange follow-up.  Advised to seek prompt in person care if worsening, new symptoms arise, or if is not improving with treatment.  Spent over 22 minutes on this visit.  Advised of options for inperson care in case PCP office not available. Did let the patient know that I only do telemedicine shifts for [facility] on Tuesdays and Thursdays and advised a follow up visit with PCP or at an [facility] if has further questions or concerns.   Follow Up Instructions:  I did not refer this patient for an OV with me in the next 24 hours for this/these issue(s).  I discussed the assessment and treatment plan with the patient. The patient was provided an opportunity to ask questions and all were answered. The patient agreed with the plan and demonstrated an understanding of the instructions.   I spent 23 minutes on this encounter.   Terressa Koyanagi, DO

## 2020-11-08 NOTE — Patient Instructions (Signed)
Please call your primary care office for an in person evaluation and also your gastroenterologist regarding your gastrointestinal symptoms.

## 2020-11-10 ENCOUNTER — Ambulatory Visit (INDEPENDENT_AMBULATORY_CARE_PROVIDER_SITE_OTHER): Payer: Medicare Other | Admitting: Internal Medicine

## 2020-11-10 ENCOUNTER — Encounter: Payer: Self-pay | Admitting: Internal Medicine

## 2020-11-10 VITALS — BP 120/76 | HR 84 | Ht 67.0 in | Wt 168.0 lb

## 2020-11-10 DIAGNOSIS — R410 Disorientation, unspecified: Secondary | ICD-10-CM

## 2020-11-10 DIAGNOSIS — D649 Anemia, unspecified: Secondary | ICD-10-CM

## 2020-11-10 DIAGNOSIS — R1013 Epigastric pain: Secondary | ICD-10-CM | POA: Diagnosis not present

## 2020-11-10 DIAGNOSIS — K219 Gastro-esophageal reflux disease without esophagitis: Secondary | ICD-10-CM

## 2020-11-10 DIAGNOSIS — E559 Vitamin D deficiency, unspecified: Secondary | ICD-10-CM | POA: Diagnosis not present

## 2020-11-10 DIAGNOSIS — R5382 Chronic fatigue, unspecified: Secondary | ICD-10-CM

## 2020-11-10 DIAGNOSIS — U099 Post covid-19 condition, unspecified: Secondary | ICD-10-CM

## 2020-11-10 DIAGNOSIS — K222 Esophageal obstruction: Secondary | ICD-10-CM

## 2020-11-10 DIAGNOSIS — R202 Paresthesia of skin: Secondary | ICD-10-CM | POA: Diagnosis not present

## 2020-11-10 DIAGNOSIS — M797 Fibromyalgia: Secondary | ICD-10-CM

## 2020-11-10 DIAGNOSIS — G9332 Myalgic encephalomyelitis/chronic fatigue syndrome: Secondary | ICD-10-CM | POA: Insufficient documentation

## 2020-11-10 LAB — CBC WITH DIFFERENTIAL/PLATELET
Basophils Absolute: 0.1 10*3/uL (ref 0.0–0.1)
Basophils Relative: 1.3 % (ref 0.0–3.0)
Eosinophils Absolute: 0.1 10*3/uL (ref 0.0–0.7)
Eosinophils Relative: 1.3 % (ref 0.0–5.0)
HCT: 41.3 % (ref 39.0–52.0)
Hemoglobin: 13.7 g/dL (ref 13.0–17.0)
Lymphocytes Relative: 31.9 % (ref 12.0–46.0)
Lymphs Abs: 2.7 10*3/uL (ref 0.7–4.0)
MCHC: 33.1 g/dL (ref 30.0–36.0)
MCV: 86.1 fl (ref 78.0–100.0)
Monocytes Absolute: 0.9 10*3/uL (ref 0.1–1.0)
Monocytes Relative: 10.9 % (ref 3.0–12.0)
Neutro Abs: 4.6 10*3/uL (ref 1.4–7.7)
Neutrophils Relative %: 54.6 % (ref 43.0–77.0)
Platelets: 360 10*3/uL (ref 150.0–400.0)
RBC: 4.8 Mil/uL (ref 4.22–5.81)
RDW: 16.5 % — ABNORMAL HIGH (ref 11.5–15.5)
WBC: 8.4 10*3/uL (ref 4.0–10.5)

## 2020-11-10 LAB — VITAMIN D 25 HYDROXY (VIT D DEFICIENCY, FRACTURES): VITD: 36.16 ng/mL (ref 30.00–100.00)

## 2020-11-10 LAB — COMPREHENSIVE METABOLIC PANEL
ALT: 13 U/L (ref 0–53)
AST: 16 U/L (ref 0–37)
Albumin: 4.3 g/dL (ref 3.5–5.2)
Alkaline Phosphatase: 66 U/L (ref 39–117)
BUN: 23 mg/dL (ref 6–23)
CO2: 29 mEq/L (ref 19–32)
Calcium: 9.5 mg/dL (ref 8.4–10.5)
Chloride: 103 mEq/L (ref 96–112)
Creatinine, Ser: 1.17 mg/dL (ref 0.40–1.50)
GFR: 61.78 mL/min (ref >60.00)
Glucose, Bld: 85 mg/dL (ref 70–99)
Potassium: 4.6 mEq/L (ref 3.5–5.1)
Sodium: 138 mEq/L (ref 135–145)
Total Bilirubin: 0.4 mg/dL (ref 0.2–1.2)
Total Protein: 7.4 g/dL (ref 6.0–8.3)

## 2020-11-10 LAB — TSH: TSH: 2.91 u[IU]/mL (ref 0.35–4.50)

## 2020-11-10 LAB — CORTISOL: Cortisol, Plasma: 6.3 ug/dL

## 2020-11-10 LAB — VITAMIN B12: Vitamin B-12: 234 pg/mL (ref 211–911)

## 2020-11-10 MED ORDER — PANTOPRAZOLE SODIUM 40 MG PO TBEC: 40.0000 mg | DELAYED_RELEASE_TABLET | Freq: Every day | ORAL | 3 refills | Status: DC

## 2020-11-10 NOTE — Assessment & Plan Note (Signed)
Labs

## 2020-11-10 NOTE — Assessment & Plan Note (Signed)
Obtain labs.

## 2020-11-10 NOTE — Patient Instructions (Signed)
You can try Lion's Mane Mushroom capsules for memory, focus, neuropathy ([employer] or [employer])   

## 2020-11-10 NOTE — Assessment & Plan Note (Signed)
Will give a Rx for Protonix for nausea

## 2020-11-10 NOTE — Assessment & Plan Note (Signed)
Post-COVID  Head CT Try Lion's Mane

## 2020-11-10 NOTE — Progress Notes (Signed)
Subjective:  Patient ID: Erik Cola Sr., male    DOB: 01/25/1946  Age: 74 y.o. MRN: [id number]  CC: Dizziness   HPI Erik ALA Sr. presents for c/o nausea, confusion, blurry vision off and on after COVID in Nov 2021. C/o GERD. He was at [facility] for GI bleed in the Summer of 2021 - he had EGD, colonosc, capsule study, CT.  He has a history of multiple surgeries including prostatectomy and cystectomy for overactive bladder.  He has been on disability for many years  Outpatient Medications Prior to Visit  Medication Sig Dispense Refill  . aspirin 81 MG chewable tablet Chew by mouth daily.    . Cholecalciferol 125 MCG (5000 UT) TABS Take by mouth.    . cyclobenzaprine (FLEXERIL) 5 MG tablet Take 5 mg by mouth 2 (two) times daily.    . diphenoxylate-atropine (LOMOTIL) 2.5-0.025 MG tablet Take 1 tablet by mouth 4 (four) times daily as needed for diarrhea or loose stools. 30 tablet 0  . famotidine (PEPCID) 40 MG tablet Take 1 tablet (40 mg total) by mouth 2 (two) times daily. 180 tablet 3  . hydrocortisone cream 1 % Apply topically.    . hydrOXYzine (ATARAX/VISTARIL) 10 MG tablet Take 10 mg by mouth at bedtime.    . hydrOXYzine (ATARAX/VISTARIL) 25 MG tablet Take 25 mg by mouth 3 (three) times daily as needed. 1/2 tablet qhs prn    . LORazepam (ATIVAN) 0.5 MG tablet Take 0.5 tablets (0.25 mg total) by mouth 2 (two) times daily as needed for anxiety. 30 tablet 0  . MELATONIN PO Take by mouth.    . ondansetron (ZOFRAN ODT) 4 MG disintegrating tablet Take 1 tablet (4 mg total) by mouth every 8 (eight) hours as needed for nausea or vomiting. 40 tablet 0  . Suvorexant 10 MG TABS Take by mouth.    . traMADol (ULTRAM) 50 MG tablet TAKE 1 TABLET(50 MG) BY MOUTH EVERY 6 HOURS AS NEEDED 120 tablet 0   No facility-administered medications prior to visit.    ROS: Review of Systems  Constitutional: Positive for fatigue. Negative for appetite change and unexpected weight change.  HENT: Negative for  congestion, nosebleeds, sneezing, sore throat and trouble swallowing.   Eyes: Negative for itching and visual disturbance.  Respiratory: Negative for cough.   Cardiovascular: Negative for chest pain, palpitations and leg swelling.  Gastrointestinal: Positive for nausea. Negative for abdominal distention, blood in stool, diarrhea, rectal pain and vomiting.  Genitourinary: Negative for frequency and hematuria.  Musculoskeletal: Positive for arthralgias and gait problem. Negative for back pain, joint swelling and neck pain.  Skin: Negative for rash.  Neurological: Positive for light-headedness. Negative for dizziness, tremors, speech difficulty and weakness.  Psychiatric/Behavioral: Negative for agitation, dysphoric mood and sleep disturbance. The patient is not nervous/anxious.     Objective:  BP 120/76   Pulse 84   Ht 5\' 7"  (1.702 m)   Wt 168 lb (76.2 kg)   SpO2 96%   BMI 26.31 kg/m   BP Readings from Last 3 Encounters:  11/10/20 120/76  09/27/20 122/70  08/15/20 124/64    Wt Readings from Last 3 Encounters:  11/10/20 168 lb (76.2 kg)  08/15/20 166 lb (75.3 kg)  03/16/20 162 lb (73.5 kg)    Physical Exam Constitutional:      General: He is not in acute distress.    Appearance: He is well-developed.     Comments: NAD  HENT:     Mouth/Throat:  Mouth: Oropharynx is clear and moist.  Eyes:     Conjunctiva/sclera: Conjunctivae normal.     Pupils: Pupils are equal, round, and reactive to light.  Neck:     Thyroid: No thyromegaly.     Vascular: No JVD.  Cardiovascular:     Rate and Rhythm: Normal rate and regular rhythm.     Pulses: Intact distal pulses.     Heart sounds: Normal heart sounds. No murmur heard. No friction rub. No gallop.   Pulmonary:     Effort: Pulmonary effort is normal. No respiratory distress.     Breath sounds: Normal breath sounds. No wheezing or rales.  Chest:     Chest wall: No tenderness.  Abdominal:     General: Bowel sounds are normal.  There is no distension.     Palpations: Abdomen is soft. There is no mass.     Tenderness: There is no abdominal tenderness. There is no guarding or rebound.  Musculoskeletal:        General: No tenderness or edema. Normal range of motion.     Cervical back: Normal range of motion.  Lymphadenopathy:     Cervical: No cervical adenopathy.  Skin:    General: Skin is warm and dry.     Findings: No rash.  Neurological:     Mental Status: He is alert and oriented to person, place, and time.     Cranial Nerves: No cranial nerve deficit.     Motor: No weakness or abnormal muscle tone.     Coordination: He displays a negative Romberg sign. Coordination normal.     Gait: Gait normal.     Deep Tendon Reflexes: Reflexes are normal and symmetric.  Psychiatric:        Mood and Affect: Mood and affect normal.        Behavior: Behavior normal.        Thought Content: Thought content normal.        Judgment: Judgment normal.   He appears sad Urostomy bag    A total time of >45 minutes was spent preparing to see the patient, reviewing tests, x-rays, operative reports and outside records.  Also, obtaining history and performing comprehensive physical exam.  Additionally, counseling the patient regarding the above listed issues.   Finally, documenting clinical information in the health records, coordination of care, educating the patient.  It is a complicated case.  His multiple previous tests, summaries, labs were reviewed and discussed with the patient  Lab Results  Component Value Date   WBC 10.2 07/06/2020   HGB 13.2 07/06/2020   HCT 40.4 07/06/2020   PLT 355.0 07/06/2020   GLUCOSE 160 (H) 03/22/2020   CHOL 163 06/30/2019   TRIG 132.0 06/30/2019   HDL 37.20 (L) 06/30/2019   LDLDIRECT 169.0 06/24/2018   LDLCALC 100 (H) 06/30/2019   ALT 11 06/30/2019   AST 10 06/30/2019   NA 135 03/22/2020   K 3.9 03/22/2020   CL 103 03/22/2020   CREATININE 1.33 03/22/2020   BUN 18 03/22/2020   CO2 24  03/22/2020   TSH 1.35 06/30/2019   PSA 0.00 (L) 06/30/2019   HGBA1C 5.9 06/30/2019    No results found.  Assessment & Plan:   Walker Kehr, MD

## 2020-11-10 NOTE — Assessment & Plan Note (Signed)
He was at [facility] for GI bleed in the Summer of 2021 - he had EGD, colonosc, capsule study, CT.  Labs - CBC, iron

## 2020-11-11 ENCOUNTER — Encounter: Payer: Self-pay | Admitting: Internal Medicine

## 2020-11-11 LAB — IRON,TIBC AND FERRITIN PANEL
%SAT: 26 % (calc) (ref 20–48)
Ferritin: 21 ng/mL — ABNORMAL LOW (ref 24–380)
Iron: 92 ug/dL (ref 50–180)
TIBC: 353 mcg/dL (calc) (ref 250–425)

## 2020-11-15 ENCOUNTER — Other Ambulatory Visit: Payer: Self-pay

## 2020-11-15 ENCOUNTER — Ambulatory Visit (INDEPENDENT_AMBULATORY_CARE_PROVIDER_SITE_OTHER)
Admission: RE | Admit: 2020-11-15 | Discharge: 2020-11-15 | Disposition: A | Discharge status: Home / Self Care | Payer: Medicare Other | Source: Ambulatory Visit | Attending: Internal Medicine | Admitting: Internal Medicine

## 2020-11-15 DIAGNOSIS — G9332 Myalgic encephalomyelitis/chronic fatigue syndrome: Secondary | ICD-10-CM

## 2020-11-15 DIAGNOSIS — R410 Disorientation, unspecified: Secondary | ICD-10-CM

## 2020-11-15 DIAGNOSIS — U099 Post covid-19 condition, unspecified: Secondary | ICD-10-CM

## 2020-11-17 ENCOUNTER — Other Ambulatory Visit: Payer: Self-pay | Admitting: Internal Medicine

## 2020-11-22 ENCOUNTER — Encounter: Payer: Self-pay | Admitting: Internal Medicine

## 2020-11-22 ENCOUNTER — Other Ambulatory Visit: Payer: Self-pay

## 2020-11-22 ENCOUNTER — Ambulatory Visit (INDEPENDENT_AMBULATORY_CARE_PROVIDER_SITE_OTHER): Payer: Medicare Other | Admitting: Internal Medicine

## 2020-11-22 VITALS — BP 122/68 | HR 73 | Temp 98.2°F | Wt 171.6 lb

## 2020-11-22 DIAGNOSIS — R739 Hyperglycemia, unspecified: Secondary | ICD-10-CM

## 2020-11-22 DIAGNOSIS — F419 Anxiety disorder, unspecified: Secondary | ICD-10-CM

## 2020-11-22 DIAGNOSIS — U071 COVID-19: Secondary | ICD-10-CM

## 2020-11-22 DIAGNOSIS — E7849 Other hyperlipidemia: Secondary | ICD-10-CM | POA: Diagnosis not present

## 2020-11-22 DIAGNOSIS — U099 Post covid-19 condition, unspecified: Secondary | ICD-10-CM | POA: Diagnosis not present

## 2020-11-22 DIAGNOSIS — Z0001 Encounter for general adult medical examination with abnormal findings: Secondary | ICD-10-CM

## 2020-11-22 DIAGNOSIS — R202 Paresthesia of skin: Secondary | ICD-10-CM

## 2020-11-22 MED ORDER — HYDROXYZINE HCL 10 MG PO TABS
10.0000 mg | ORAL_TABLET | Freq: Every day | ORAL | 3 refills | Status: DC
Start: 2020-11-22 — End: 2021-12-07

## 2020-11-22 NOTE — Patient Instructions (Signed)
Ok to use the bilateral wrist splints at night to try to help the tingling  Please continue all other medications as before, and refills have been done if requested.  Please have the pharmacy call with any other refills you may need.  Please continue your efforts at being more active, low cholesterol diet, and weight control.  You are otherwise up to date with prevention measures today.  Please keep your appointments with your specialists as you may have planned  Please make an Appointment to return in 6 months, or sooner if needed

## 2020-11-22 NOTE — Progress Notes (Signed)
Established Patient Office Visit  Subjective:  Patient ID: Erik KINGBIRD Sr., male    DOB: [birth year]  Age: 75 y.o. MRN: [id number]       Chief Complaint:: wellness exam and Follow-up (Req refill on Hydroxyzine)       HPI:  Erik PENNY Sr. is a 75 y.o. male here for wellness exam, anxiety doing well except needs atarax refill, has post covid symptoms of mild confused at times, fatigue, dizzy, blurry vision and increased thirst.  Also incidentally with c/o 1 mo intermittent mild bilateral hand tingling, worse to get up in the AM, and uses his hands for work most days around the house.  Pt denies chest pain, increased sob or doe, wheezing, orthopnea, PND, increased LE swelling, palpitations, or syncope. Pt denies new neurological symptoms such as new headache, or facial or extremity weakness.   Pt denies polydipsia, polyuria,.  Pt states overall good compliance with meds, trying to follow lower cholesterol, diabetic diet, wt overall stable but little exercise however.     Wt Readings from Last 3 Encounters:  11/24/20 170 lb (77.1 kg)  11/22/20 171 lb 9.6 oz (77.8 kg)  11/10/20 168 lb (76.2 kg)   BP Readings from Last 3 Encounters:  11/24/20 118/80  11/22/20 122/68  11/10/20 120/76        Past Medical History:  Diagnosis Date  . Allergic rhinitis   . Allergy   . Anal fissure   . Anxiety   . Cataract    starting  . Coronary atherosclerosis of native coronary artery 2007   nonobstructive CAD by cath with 40% mid-LAD stenosis  . Degenerative arthritis of knee, bilateral 10/09/2017  . Depression   . Diverticulosis 12/18/2011  . Diverticulosis of colon with hemorrhage    April 2021, [facility]  . Fibromyalgia   . GERD with stricture   . Helicobacter pylori (H. pylori) infection 11/25/2012   11/2012 EGD + gastric bxs  . Hiatal hernia   . HLD (hyperlipidemia)   . HTN (hypertension)   . Hyperlipidemia   . IBS (irritable bowel syndrome)   . Impotence of organic origin   .  Insomnia   . Internal and external hemorrhoids without complication   . Interstitial cystitis   . Irritable bowel syndrome 06/09/2010   Qualifier: Diagnosis of  By: Carlean Purl MD, Tonna Boehringer E   . Pelvic floor dysfunction 11/29/2017  . Reactive hypoglycemia   . Rheumatoid arthritis(714.0)   . 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 Vitamin D deficiency 11/29/2017   Past Surgical History:  Procedure Laterality Date  . bladder distention     x 6  . CATHETER REMOVAL     super pubic area  . COLONOSCOPY    . DENTAL SURGERY Right    #30 tooth extraction (right upper)  . INTERSTIM IMPLANT PLACEMENT    . INTERSTIM IMPLANT REMOVAL    . KNEE ARTHROSCOPY     right x 2  . NISSEN FUNDOPLICATION  7035  . SHOULDER ARTHROSCOPY  2010   left  . TONSILLECTOMY AND ADENOIDECTOMY  1957  . TRANSURETHRAL RESECTION OF PROSTATE     x 2  . UPPER GASTROINTESTINAL ENDOSCOPY  05/13/2008   hiatal hernia  . VASECTOMY      reports that he has quit smoking. He has never used smokeless tobacco. He reports that he does not drink alcohol and does not use drugs. family history includes Breast cancer in his sister; Colon polyps in his father; Diabetes in his  father, paternal uncle, and sister; Heart disease in his father and mother; Hypertension in his father and mother; Stroke in his mother. Allergies  Allergen Reactions  . Ambien [Zolpidem]   . Bentyl [Dicyclomine]     Memory loss, anxiety, blurred vision  . Lexapro [Escitalopram Oxalate]   . Pneumovax [Pneumococcal Polysaccharide Vaccine] Other (See Comments)    pain  . Prilosec [Omeprazole] Other (See Comments)    Per patient joint pain with PPI's  . Remeron [Mirtazapine]   . Statins Other (See Comments)    Joint pain  . Tramadol Other (See Comments)  . Tylenol [Acetaminophen]    Current Outpatient Medications on File Prior to Visit  Medication Sig Dispense Refill  . aspirin 81 MG chewable tablet Chew by mouth daily.    . cyclobenzaprine (FLEXERIL) 5 MG  tablet Take 5 mg by mouth 2 (two) times daily.    . famotidine (PEPCID) 40 MG tablet Take 1 tablet (40 mg total) by mouth 2 (two) times daily. 180 tablet 3  . LORazepam (ATIVAN) 0.5 MG tablet TAKE 0.5 TABLETS BY MOUTH 2 TIMES DAILY AS NEEDED FOR ANXIETY. 30 tablet 2  . traMADol (ULTRAM) 50 MG tablet TAKE 1 TABLET(50 MG) BY MOUTH EVERY 6 HOURS AS NEEDED 120 tablet 0   No current facility-administered medications on file prior to visit.        ROS:  All others reviewed and negative.  Objective        PE:  BP 122/68 (BP Location: Left Arm)   Pulse 73   Temp 98.2 F (36.8 C) (Oral)   Wt 171 lb 9.6 oz (77.8 kg)   SpO2 96%   BMI 26.88 kg/m                 Constitutional: Pt appears in NAD               HENT: Head: NCAT.                Right Ear: External ear normal.                 Left Ear: External ear normal.                Eyes: . Pupils are equal, round, and reactive to light. Conjunctivae and EOM are normal               Nose: without d/c or deformity               Neck: Neck supple. Gross normal ROM               Cardiovascular: Normal rate and regular rhythm.                 Pulmonary/Chest: Effort normal and breath sounds without rales or wheezing.                Abd:  Soft, NT, ND, + BS, no organomegaly               Neurological: Pt is alert. At baseline orientation, motor grossly intact, hand with LT intact               Skin: Skin is warm. No rashes, no other new lesions, LE edema - none               Psychiatric: Pt behavior is normal without agitation   Assessment/Plan:  Erik Lolling Sr. is a 75 y.o. White or Caucasian [  1] male with  has a past medical history of Allergic rhinitis, Allergy, Anal fissure, Anxiety, Cataract, Coronary atherosclerosis of native coronary artery (2007), Degenerative arthritis of knee, bilateral (10/09/2017), Depression, Diverticulosis (12/18/2011), Diverticulosis of colon with hemorrhage, Fibromyalgia, GERD with stricture, Helicobacter pylori  (H. pylori) infection (11/25/2012), Hiatal hernia, HLD (hyperlipidemia), HTN (hypertension), Hyperlipidemia, IBS (irritable bowel syndrome), Impotence of organic origin, Insomnia, Internal and external hemorrhoids without complication, Interstitial cystitis, Irritable bowel syndrome (06/09/2010), Pelvic floor dysfunction (11/29/2017), Reactive hypoglycemia, Rheumatoid arthritis(714.0), Somatization disorder (02/27/2012), and Vitamin D deficiency (11/29/2017).    Assessment Plan  See notes Labs/data reviewed for each problem:  Micro: none  Cardiac tracings I have personally interpreted today:  none  Pertinent Radiological findings (summarize): none    There are no preventive care reminders to display for this patient.  There are no preventive care reminders to display for this patient.  Lab Results  Component Value Date   TSH 2.91 11/10/2020   Lab Results  Component Value Date   WBC 8.4 11/10/2020   HGB 13.7 11/10/2020   HCT 41.3 11/10/2020   MCV 86.1 11/10/2020   PLT 360.0 11/10/2020   Lab Results  Component Value Date   NA 138 11/10/2020   K 4.6 11/10/2020   CO2 29 11/10/2020   GLUCOSE 85 11/10/2020   BUN 23 11/10/2020   CREATININE 1.17 11/10/2020   BILITOT 0.4 11/10/2020   ALKPHOS 66 11/10/2020   AST 16 11/10/2020   ALT 13 11/10/2020   PROT 7.4 11/10/2020   ALBUMIN 4.3 11/10/2020   CALCIUM 9.5 11/10/2020   ANIONGAP 10 03/26/2017   GFR 61.78 11/10/2020   Lab Results  Component Value Date   CHOL 163 06/30/2019   Lab Results  Component Value Date   HDL 37.20 (L) 06/30/2019   Lab Results  Component Value Date   LDLCALC 100 (H) 06/30/2019   Lab Results  Component Value Date   TRIG 132.0 06/30/2019   Lab Results  Component Value Date   CHOLHDL 4 06/30/2019   Lab Results  Component Value Date   HGBA1C 5.9 06/30/2019      Assessment & Plan:   Problem List Items Addressed This Visit      High   Encounter for well adult exam with abnormal findings -  Primary    Overall doing well, age appropriate education and counseling updated, referrals for preventative services and immunizations addressed, dietary and smoking counseling addressed, most recent labs reviewed.  I have personally reviewed and have noted:  1) the patient's medical and social history 2) The pt's use of alcohol, tobacco, and illicit drugs 3) The patient's current medications and supplements 4) Functional ability including ADL's, fall risk, home safety risk, hearing and visual impairment 5) Diet and physical activities 6) Evidence for depression or mood disorder 7) The patient's height, weight, and BMI have been recorded in the chart  I have made referrals, and provided counseling and education based on review of the above Declines labs today      COVID-19 virus infection    With mild post covid symptoms, pt reasured, ok to continue to follow        Medium   Paresthesia of hand, bilateral    C/w mild neuritic abnormal such as bilat CTS, exam benign, ok to follow with bilateral wrist splints at night until improved      Hyperlipidemia    Lab Results  Component Value Date   LDLCALC 100 (H) 06/30/2019   Stable, pt conts  to declines statin, for lower chol diet     Current Outpatient Medications (Analgesics):  .  aspirin 81 MG chewable tablet, Chew by mouth daily. .  traMADol (ULTRAM) 50 MG tablet, TAKE 1 TABLET(50 MG) BY MOUTH EVERY 6 HOURS AS NEEDED   Current Outpatient Medications (Other):  .  cyclobenzaprine (FLEXERIL) 5 MG tablet, Take 5 mg by mouth 2 (two) times daily. .  famotidine (PEPCID) 40 MG tablet, Take 1 tablet (40 mg total) by mouth 2 (two) times daily. Marland Kitchen  LORazepam (ATIVAN) 0.5 MG tablet, TAKE 0.5 TABLETS BY MOUTH 2 TIMES DAILY AS NEEDED FOR ANXIETY. .  hydrOXYzine (ATARAX/VISTARIL) 10 MG tablet, Take 1 tablet (10 mg total) by mouth at bedtime.       Hyperglycemia    Lab Results  Component Value Date   HGBA1C 5.9 06/30/2019   Stable, pt  to continue current medical treatment  - diet     Current Outpatient Medications (Analgesics):  .  aspirin 81 MG chewable tablet, Chew by mouth daily. .  traMADol (ULTRAM) 50 MG tablet, TAKE 1 TABLET(50 MG) BY MOUTH EVERY 6 HOURS AS NEEDED   Current Outpatient Medications (Other):  .  cyclobenzaprine (FLEXERIL) 5 MG tablet, Take 5 mg by mouth 2 (two) times daily. .  famotidine (PEPCID) 40 MG tablet, Take 1 tablet (40 mg total) by mouth 2 (two) times daily. Marland Kitchen  LORazepam (ATIVAN) 0.5 MG tablet, TAKE 0.5 TABLETS BY MOUTH 2 TIMES DAILY AS NEEDED FOR ANXIETY. .  hydrOXYzine (ATARAX/VISTARIL) 10 MG tablet, Take 1 tablet (10 mg total) by mouth at bedtime.       Anxiety    Overall stable, but for hydroxyzine refill,  to f/u any worsening symptoms or concerns      Relevant Medications   hydrOXYzine (ATARAX/VISTARIL) 10 MG tablet      Meds ordered this encounter  Medications  . hydrOXYzine (ATARAX/VISTARIL) 10 MG tablet    Sig: Take 1 tablet (10 mg total) by mouth at bedtime.    Dispense:  90 tablet    Refill:  3    Follow-up: Return in about 6 months (around 05/22/2021).   Erik Cower, MD 11/27/2020 8:51 PM [facility]

## 2020-11-24 ENCOUNTER — Ambulatory Visit: Payer: Medicare Other | Admitting: Nurse Practitioner

## 2020-11-24 ENCOUNTER — Encounter: Payer: Self-pay | Admitting: Nurse Practitioner

## 2020-11-24 VITALS — BP 118/80 | HR 106 | Ht 67.0 in | Wt 170.0 lb

## 2020-11-24 DIAGNOSIS — R079 Chest pain, unspecified: Secondary | ICD-10-CM | POA: Diagnosis not present

## 2020-11-24 DIAGNOSIS — D509 Iron deficiency anemia, unspecified: Secondary | ICD-10-CM

## 2020-11-24 DIAGNOSIS — D508 Other iron deficiency anemias: Secondary | ICD-10-CM

## 2020-11-24 DIAGNOSIS — R1310 Dysphagia, unspecified: Secondary | ICD-10-CM | POA: Diagnosis not present

## 2020-11-24 NOTE — Progress Notes (Signed)
ASSESSMENT AND PLAN    #75 year old male with CAD, hypertension, hyperlipidemia, IBS, pelvic floor dysfunction, anxiety, depression, fibromyalgia, H. pylori infection in 2014, rheumatoid arthritis, somatization disorder, chronic chest pain, interstitial cystitis status post cystectomy,  GERD with stricture, s/p remote Nissen fundoplication, diverticulosis,  anal fissure, COVID19.   # Solid food dysphagia. Negative EGD at [facility] in April for a bleed but patient doesn't know if he was having dysphagia at that time.   --Arrange for barium swallow with tablet --will notify of results and further recommendations --Follow up with Dr. Carlean Purl.   # Chronic chest pain. Evaluated by [facility] in April 2021. Myocardial perfusion study in August was normal --continue Pepcid 2-3 times a week as tolerated.   # Iron deficiency following major GI bleed in April 2021. Required several units of blood. Hgb now norma. Ferritin in 20s. He cannot tolerate oral iron as it, along with other medications causes him to have chest pain.    # Major GI bleed in April. Admitted to [facility] with bloody stool ( red blood and ? Melena). EGD negative.  Subsequently transferred to [facility].  Colonoscopy with 15 cm examination of TI was unrevealing. Capsule study showed a single spot with no bleeding in the colon at 3-hours 10-minutes. Bleeding was seen in the lumen of the colon at 4-hours 24-minutes. There  was an initial clot of red blood, then maroon/black stool . Bleed presumed to be a diverticular hemorrhage. See Care Everywhere reports below  # IBS / Pelvic floor dysfunction. Works with PT at [employer]  # Multiple medication allergies  [place]     Primary Gastroenterologist : Silvano Rusk, MD  Chief Complaint : nausea, dizziness, swallowing  Erik Lolling Sr. is a 75 y.o. male last seen here early October of this year for diarrhea.  He has IBS with pelvic floor  dysfunction, chronic abdominal pain and chronic chest pain.   He has been tried on Dicyclomine but it caused blurry vision.  We is working with pelvic floor therapy for pelvi floor dysfunction. He missed a few session in November due to having COVID19.   Patient is her with complaints of feeling dizzy and nauseated since having COVID. PCP told him symptoms may last a few months after COViD19 infection. He cannot tolerate Zofran, says it gives him diarrhea.  He describes lightheadness but no sensation of things spinning.  He hasn't been prescribed anything for the dizziness.    Patient also complains of intermittent dysphagia to meat and bread over last several weeks. When episodes occur he stops eating and drinks water to help food pass. . He has chronic ( years ) chest pain exacerbated by certain medications like Nyquil or iron or even Pepcid. Lately he has been having burning chest pain at night. .No regurgitation since fundoplication several years ago. He takes Pepcid but only 2-3 times a week as.It doesn't always help and can even worsen the burning.  Also he believe taking Pepcid on a regular basis previously caused burning in his pelvis at one time.  He can't take PPIs as they cause diarrhea and joint pain.   Three days after 2nd COVID vaccine patient was hospitalized at [facility] for two days after presenting with bloody loose stools / black stools.CT scan unrevealing.  EGD was negative. Bleeding persisted, he was transferred to [facility]. Patient says the blood was predominantly red but several notes mention melenic stools. He required  several units of blood. For pesistient bleeding he was transferred to [facility]. Colonoscopy there was unrevealing.  Capsule studyCT scan w/ contrast   Patient says he needs an iron infusion and brings in a copy of labs showing low ferritin. He cannot take iron because if causes chest pain.     Data Reviewed:  02/17/20 CT abd / pelvis w/ contast - No acute  abdominal/pelvic findings, mass lesions or adenopathy. Status post cystoprostatectomy with ileal conduit. No complicating features are identified.  A 2.9 x 2.9 cm septated appearing cystic lesion in the right lower pelvis adjacent to the sigmoid colon. I think this is most likely a  postoperative fluid collection such as liquified hematoma or seroma.  Attention on future scans is suggested.  No findings for inflammatory bowel process or bowel mass.  5. Stable bilateral renal cysts.    11/10/2020 CBC normal Ferritin 21, TIBC 353, 26% iron saturation CMP normal  Previous Endoscopic Evaluations / Pertinent Studies:   January 2017 screening colonoscopy -- Complete exam, excellent prep -- Mild diverticulosis  January 2017 EGD for epigastric pain -- Mild to moderate gastropathy, intact fundoplication  April [id number] EGD at [facility] -small hiatal hernia, prior fundoplication, stomach, duodenum and jejunum were normal.   April 2021 colonoscopy with examination of 15 cm of TI [facility] --excellent prep.  --diverticulosis --grade I internal hemorrhoids.   April 2021 Capsule Endo at [facility] Findings: Images of the esophagus, stomach and small bowel were obtained from the swallowed capsule and reviewed. The first gastric image was captured at 0-hours 1-minute and 5-seconds.The first duodenal image was captured at 0-hours 16-minutes and 18-seconds. The first cecal image was captured at 2-hours 40-minutes and 18-seconds. On review of the recorded study, it was determined that the study lasted  7-hours 59-minutes 57-seconds (total recording time). The capsule enteroscope entered the cecum prior to the end of the recording. The small bowel passage time of the capsule enteroscope was 2-hours 24-minutes. Lymphangiectasia was found in the small bowel at 0-hours 19-minutes, 0-hours 26-minutes, and 0-hours 48-minutes.A staple and area of hypopigmented mucosa (?focal mucosal denudation)  was  visualized in the small bowel at 2-hours 23-minutes.  A single erosion with no bleeding was found in the colon at 2-hours 42-minutes. A single spot with no bleeding was found in the colon at 3-hours 10-minutes. Bleeding was seen in the lumen of the colon at 4-hours 24-minutes. There  was an initial clot of red blood, then maroon/black stool for remainder of study.                                                 Past Medical History:  Diagnosis Date  . Allergic rhinitis   . Allergy   . Anal fissure   . Anxiety   . Cataract    starting  . Coronary atherosclerosis of native coronary artery 2007   nonobstructive CAD by cath with 40% mid-LAD stenosis  . Degenerative arthritis of knee, bilateral 10/09/2017  . Depression   . Diverticulosis 12/18/2011  . Diverticulosis of colon with hemorrhage    April 2021, [facility] healthcare  . Fibromyalgia   . GERD with stricture   . Helicobacter pylori (H. pylori) infection 11/25/2012   11/2012 EGD + gastric bxs  . Hiatal hernia   . HLD (hyperlipidemia)   . HTN (hypertension)   . Hyperlipidemia   .  Impotence of organic origin   . Insomnia   . Internal and external hemorrhoids without complication   . Interstitial cystitis   . Irritable bowel syndrome 06/09/2010   Qualifier: Diagnosis of  By: Carlean Purl MD, Tonna Boehringer E   . Pelvic floor dysfunction 11/29/2017  . Reactive hypoglycemia   . Rheumatoid arthritis(714.0)   . 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 Vitamin D deficiency 11/29/2017    Current Medications, Allergies, Past Surgical History, Family History and Social History were reviewed in [employer] record.   Current Outpatient Medications  Medication Sig Dispense Refill  . aspirin 81 MG chewable tablet Chew by mouth daily.    . cyclobenzaprine (FLEXERIL) 5 MG tablet Take 5 mg by mouth 2 (two) times daily.    . famotidine (PEPCID) 40 MG tablet Take 1 tablet (40 mg total) by mouth 2  (two) times daily. 180 tablet 3  . hydrOXYzine (ATARAX/VISTARIL) 10 MG tablet Take 1 tablet (10 mg total) by mouth at bedtime. 90 tablet 3  . LORazepam (ATIVAN) 0.5 MG tablet TAKE 0.5 TABLETS BY MOUTH 2 TIMES DAILY AS NEEDED FOR ANXIETY. 30 tablet 2  . traMADol (ULTRAM) 50 MG tablet TAKE 1 TABLET(50 MG) BY MOUTH EVERY 6 HOURS AS NEEDED 120 tablet 0   No current facility-administered medications for this visit.    Review of Systems: Positive for chest pain. No shortness of breath. No urinary complaints.   PHYSICAL EXAM :    Wt Readings from Last 3 Encounters:  11/24/20 170 lb (77.1 kg)  11/22/20 171 lb 9.6 oz (77.8 kg)  11/10/20 168 lb (76.2 kg)    BP 118/80 (BP Location: Left Arm, Patient Position: Sitting)   Pulse (!) 106   Ht 5\' 7"  (1.702 m)   Wt 170 lb (77.1 kg)   SpO2 96%   BMI 26.63 kg/m  Constitutional:  male in no acute distress. Psychiatric: Normal mood and affect.Marland Kitchen EENT: Pupils normal.  Conjunctivae are normal. No scleral icterus. Neck supple.  Cardiovascular: Normal rate, regular rhythm. No edema Pulmonary/chest: Effort normal and breath sounds normal. No wheezing, rales or rhonchi. Abdominal: Soft, nondistended, nontender. Bowel sounds active throughout. There are no masses palpable. No hepatomegaly. Neurological: Alert and oriented to person place and time. Skin: Skin is warm and dry. No rashes noted.  Tye Savoy, NP  11/24/2020, 3:10 PM

## 2020-11-24 NOTE — Patient Instructions (Signed)
If you are age 74 or older, your body mass index should be between 23-30. Your Body mass index is 26.63 kg/m. If this is out of the aforementioned range listed, please consider follow up with your Primary Care Provider.  If you are age 28 or younger, your body mass index should be between 19-25. Your Body mass index is 26.63 kg/m. If this is out of the aformentioned range listed, please consider follow up with your Primary Care Provider.   You have been scheduled for a follow up with Dr. Carlean Purl on 12/29/20 at 9:30 AM   You have been scheduled for a Barium Esophogram at [facility] (1st floor of the hospital) on 11/30/2020 at 10:30AM. Please arrive 15 minutes prior to your appointment for registration. Make certain not to have anything to eat or drink 3 hours prior to your test. If you need to reschedule for any reason, please contact radiology at [phone] to do so. __________________________________________________________________ A barium swallow is an examination that concentrates on views of the esophagus. This tends to be a double contrast exam (barium and two liquids which, when combined, create a gas to distend the wall of the oesophagus) or single contrast (non-ionic iodine based). The study is usually tailored to your symptoms so a good history is essential. Attention is paid during the study to the form, structure and configuration of the esophagus, looking for functional disorders (such as aspiration, dysphagia, achalasia, motility and reflux) EXAMINATION You may be asked to change into a gown, depending on the type of swallow being performed. A radiologist and radiographer will perform the procedure. The radiologist will advise you of the type of contrast selected for your procedure and direct you during the exam. You will be asked to stand, sit or lie in several different positions and to hold a small amount of fluid in your mouth before being asked to swallow while the imaging  is performed .In some instances you may be asked to swallow barium coated marshmallows to assess the motility of a solid food bolus. The exam can be recorded as a digital or video fluoroscopy procedure. POST PROCEDURE It will take 1-2 days for the barium to pass through your system. To facilitate this, it is important, unless otherwise directed, to increase your fluids for the next 24-48hrs and to resume your normal diet.  This test typically takes about 30 minutes to perform. __________________________________________________________________________________   It was great seeing you today!  Thank you for entrusting me with your care and choosing [facility].  Tye Savoy, NP

## 2020-11-27 ENCOUNTER — Encounter: Payer: Self-pay | Admitting: Nurse Practitioner

## 2020-11-27 ENCOUNTER — Encounter: Payer: Self-pay | Admitting: Internal Medicine

## 2020-11-27 DIAGNOSIS — R202 Paresthesia of skin: Secondary | ICD-10-CM | POA: Insufficient documentation

## 2020-11-27 NOTE — Assessment & Plan Note (Signed)
Overall stable, but for hydroxyzine refill,  to f/u any worsening symptoms or concerns

## 2020-11-27 NOTE — Assessment & Plan Note (Signed)
With mild post covid symptoms, pt reasured, ok to continue to follow

## 2020-11-27 NOTE — Assessment & Plan Note (Signed)
Lab Results  Component Value Date   HGBA1C 5.9 06/30/2019   Stable, pt to continue current medical treatment  - diet     Current Outpatient Medications (Analgesics):  .  aspirin 81 MG chewable tablet, Chew by mouth daily. .  traMADol (ULTRAM) 50 MG tablet, TAKE 1 TABLET(50 MG) BY MOUTH EVERY 6 HOURS AS NEEDED   Current Outpatient Medications (Other):  .  cyclobenzaprine (FLEXERIL) 5 MG tablet, Take 5 mg by mouth 2 (two) times daily. .  famotidine (PEPCID) 40 MG tablet, Take 1 tablet (40 mg total) by mouth 2 (two) times daily. Marland Kitchen  LORazepam (ATIVAN) 0.5 MG tablet, TAKE 0.5 TABLETS BY MOUTH 2 TIMES DAILY AS NEEDED FOR ANXIETY. .  hydrOXYzine (ATARAX/VISTARIL) 10 MG tablet, Take 1 tablet (10 mg total) by mouth at bedtime.

## 2020-11-27 NOTE — Assessment & Plan Note (Signed)

## 2020-11-27 NOTE — Assessment & Plan Note (Signed)
Lab Results  Component Value Date   LDLCALC 100 (H) 06/30/2019   Stable, pt conts to declines statin, for lower chol diet     Current Outpatient Medications (Analgesics):  .  aspirin 81 MG chewable tablet, Chew by mouth daily. .  traMADol (ULTRAM) 50 MG tablet, TAKE 1 TABLET(50 MG) BY MOUTH EVERY 6 HOURS AS NEEDED   Current Outpatient Medications (Other):  .  cyclobenzaprine (FLEXERIL) 5 MG tablet, Take 5 mg by mouth 2 (two) times daily. .  famotidine (PEPCID) 40 MG tablet, Take 1 tablet (40 mg total) by mouth 2 (two) times daily. Marland Kitchen  LORazepam (ATIVAN) 0.5 MG tablet, TAKE 0.5 TABLETS BY MOUTH 2 TIMES DAILY AS NEEDED FOR ANXIETY. .  hydrOXYzine (ATARAX/VISTARIL) 10 MG tablet, Take 1 tablet (10 mg total) by mouth at bedtime.

## 2020-11-27 NOTE — Assessment & Plan Note (Signed)
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

## 2020-11-28 DIAGNOSIS — Z932 Ileostomy status: Secondary | ICD-10-CM | POA: Diagnosis not present

## 2020-11-30 ENCOUNTER — Other Ambulatory Visit: Payer: Self-pay

## 2020-11-30 ENCOUNTER — Ambulatory Visit (HOSPITAL_COMMUNITY)
Admission: RE | Admit: 2020-11-30 | Discharge: 2020-11-30 | Disposition: A | Discharge status: Home / Self Care | Payer: Medicare Other | Source: Ambulatory Visit | Attending: Nurse Practitioner | Admitting: Nurse Practitioner

## 2020-11-30 DIAGNOSIS — R079 Chest pain, unspecified: Secondary | ICD-10-CM

## 2020-11-30 DIAGNOSIS — R1310 Dysphagia, unspecified: Secondary | ICD-10-CM | POA: Insufficient documentation

## 2020-11-30 DIAGNOSIS — K2289 Other specified disease of esophagus: Secondary | ICD-10-CM | POA: Diagnosis not present

## 2020-12-15 DIAGNOSIS — H10023 Other mucopurulent conjunctivitis, bilateral: Secondary | ICD-10-CM | POA: Diagnosis not present

## 2020-12-22 ENCOUNTER — Telehealth: Payer: Self-pay | Admitting: Internal Medicine

## 2020-12-22 DIAGNOSIS — M069 Rheumatoid arthritis, unspecified: Secondary | ICD-10-CM

## 2020-12-22 NOTE — Telephone Encounter (Signed)
Patient states he can't get much sleep because his arthritis is so bad and he is wondering if there was some medicine we can send in for him for this. Denied appointment because he said Dr. Jenny Reichmann already knows about the issue

## 2020-12-23 MED ORDER — MELOXICAM 15 MG PO TABS: 15.0000 mg | ORAL_TABLET | Freq: Every day | ORAL | 5 refills | Status: DC | PRN

## 2020-12-23 NOTE — Telephone Encounter (Signed)
Ok for [employer] (nsaid) 15 mg prn- [facility]  I can also refer to Rheumatology for further management of his RA as well if he likes, thanks

## 2020-12-23 NOTE — Telephone Encounter (Signed)
Patient notified

## 2020-12-24 NOTE — Telephone Encounter (Signed)
Ok this is done 

## 2020-12-24 NOTE — Addendum Note (Signed)
Addended by: [name] on: 12/24/2020 09:04 AM   Modules accepted: Orders

## 2020-12-28 DIAGNOSIS — Z932 Ileostomy status: Secondary | ICD-10-CM | POA: Diagnosis not present

## 2020-12-29 ENCOUNTER — Encounter: Payer: Self-pay | Admitting: Internal Medicine

## 2020-12-29 ENCOUNTER — Other Ambulatory Visit: Payer: Self-pay | Admitting: Internal Medicine

## 2020-12-29 ENCOUNTER — Other Ambulatory Visit (INDEPENDENT_AMBULATORY_CARE_PROVIDER_SITE_OTHER): Payer: Medicare Other

## 2020-12-29 ENCOUNTER — Ambulatory Visit: Payer: Medicare Other | Admitting: Internal Medicine

## 2020-12-29 VITALS — BP 110/78 | HR 88 | Ht 67.0 in | Wt 168.8 lb

## 2020-12-29 DIAGNOSIS — E611 Iron deficiency: Secondary | ICD-10-CM

## 2020-12-29 DIAGNOSIS — R1319 Other dysphagia: Secondary | ICD-10-CM

## 2020-12-29 DIAGNOSIS — R0789 Other chest pain: Secondary | ICD-10-CM

## 2020-12-29 DIAGNOSIS — R079 Chest pain, unspecified: Secondary | ICD-10-CM

## 2020-12-29 DIAGNOSIS — R1310 Dysphagia, unspecified: Secondary | ICD-10-CM

## 2020-12-29 LAB — CBC WITH DIFFERENTIAL/PLATELET
Basophils Absolute: 0.1 10*3/uL (ref 0.0–0.1)
Basophils Relative: 1.2 % (ref 0.0–3.0)
Eosinophils Absolute: 0.1 10*3/uL (ref 0.0–0.7)
Eosinophils Relative: 1.4 % (ref 0.0–5.0)
HCT: 43.4 % (ref 39.0–52.0)
Hemoglobin: 15 g/dL (ref 13.0–17.0)
Lymphocytes Relative: 29.3 % (ref 12.0–46.0)
Lymphs Abs: 2.9 10*3/uL (ref 0.7–4.0)
MCHC: 34.6 g/dL (ref 30.0–36.0)
MCV: 87 fl (ref 78.0–100.0)
Monocytes Absolute: 1.1 10*3/uL — ABNORMAL HIGH (ref 0.1–1.0)
Monocytes Relative: 11 % (ref 3.0–12.0)
Neutro Abs: 5.6 10*3/uL (ref 1.4–7.7)
Neutrophils Relative %: 57.1 % (ref 43.0–77.0)
Platelets: 325 10*3/uL (ref 150.0–400.0)
RBC: 4.98 Mil/uL (ref 4.22–5.81)
RDW: 14.3 % (ref 11.5–15.5)
WBC: 9.7 10*3/uL (ref 4.0–10.5)

## 2020-12-29 LAB — FERRITIN: Ferritin: 13.3 ng/mL — ABNORMAL LOW (ref 22.0–322.0)

## 2020-12-29 MED ORDER — FERROUS SULFATE 75 (15 FE) MG/ML PO SOLN: 75.0000 mg | Freq: Every day | ORAL | 3 refills | Status: DC

## 2020-12-29 NOTE — Patient Instructions (Signed)
You have been scheduled for an endoscopy. Please follow written instructions given to you at your visit today. If you use inhalers (even only as needed), please bring them with you on the day of your procedure.  Your provider has requested that you go to the basement level for lab work before leaving today. Press "B" on the elevator. The lab is located at the first door on the left as you exit the elevator.   Due to recent changes in healthcare laws, you may see the results of your imaging and laboratory studies on MyChart before your provider has had a chance to review them.  We understand that in some cases there may be results that are confusing or concerning to you. Not all laboratory results come back in the same time frame and the provider may be waiting for multiple results in order to interpret others.  Please give [name] 48 hours in order for your provider to thoroughly review all the results before contacting the office for clarification of your results.    I appreciate the opportunity to care for you. Silvano Rusk, MD, [facility]

## 2020-12-29 NOTE — Progress Notes (Signed)
[name]. 75 y.o. 1946/02/24 [id number]  Assessment & Plan:   Encounter Diagnoses  Name Primary?  . Odynophagia Yes  . Xiphodynia   . Chest pain, unspecified type   . Esophageal dysphagia   . Iron deficiency     Proceed with EGD likely balloon dilation of the GE junction plus or minus other types of dilation.  Look for mucosal damage or other causes of dysphagia.  I explained we might not see something that is obvious but a therapeutic procedure with dilation is reasonable.  Possibilities include this coming from scarring or reflux related to fundoplication and recurrent hernia, overlay from somatization disorder, dysmotility is still in the differential.  Should the EGD be unhelpful we could consider manometry.  He is tender in the xiphoid I think that is a separate issue.  Certainly may be a contributing factor in how he feels overall.  Ferritin and CBC will be checked today he had a normal hemoglobin with a low ferritin in December.  He has not taken iron due to fear of that triggering esophageal symptoms.  We could try liquid iron supplementation if necessary.  I am suspicious that the iron deficiency is related to previous GI bleeding.    Subjective:   Chief Complaint: Odor odor 9 aphasia  HPI Patient is here for follow-up, history of Nissen fundoplication, pelvic floor dysfunction IBS-D, interstitial cystitis status post cystectomy and ileal conduit, and multiple abdominal symptoms over the years.  He has been having odynophagia and vague dysphagia symptoms and was seen by Tye Savoy recently and had a barium swallow that showed mild dysmotility small recurrent hiatal hernia mild gastric reflux with the siphon test and passage of a 13 mm barium tablet without difficulty.  He tells me with pills he has had pain in the lower chest Atarax Tylenol tramadol lorazepam and Flexeril who have symptoms for 20 minutes after that.  He has some very vague dysphagia symptoms  some mild pain when eating food and he does have some clear-cut episodic rare impact dysphagia.  He had had an EGD if not to in the spring when he was hospitalized in [facility] I think he had 1 at the hospital in [place] and then had 1 with a capsule Endo and a colonoscopy which failed to reveal a clear cause of a GI bleed.  I reviewed the notes of the EGD and it shows an intact fundoplication but no other abnormalities.  They were not focused on dysphagia though he says he had some of the symptoms then.   Lab Results  Component Value Date   FERRITIN 21 (L) 11/10/2020   Lab Results  Component Value Date   WBC 8.4 11/10/2020   HGB 13.7 11/10/2020   HCT 41.3 11/10/2020   MCV 86.1 11/10/2020   PLT 360.0 11/10/2020    Allergies  Allergen Reactions  . Ambien [Zolpidem]   . Bentyl [Dicyclomine]     Memory loss, anxiety, blurred vision  . Lexapro [Escitalopram Oxalate]   . Pneumovax [Pneumococcal Polysaccharide Vaccine] Other (See Comments)    pain  . Prilosec [Omeprazole] Other (See Comments)    Per patient joint pain with PPI's  . Remeron [Mirtazapine]   . Statins Other (See Comments)    Joint pain  . Tramadol Other (See Comments)  . Tylenol [Acetaminophen]    Current Meds  Medication Sig  . cyclobenzaprine (FLEXERIL) 5 MG tablet Take 5 mg by mouth 2 (two) times daily.  . famotidine (PEPCID)  40 MG tablet Take 1 tablet (40 mg total) by mouth 2 (two) times daily.  . hydrOXYzine (ATARAX/VISTARIL) 10 MG tablet Take 1 tablet (10 mg total) by mouth at bedtime.  Marland Kitchen LORazepam (ATIVAN) 0.5 MG tablet TAKE 0.5 TABLETS BY MOUTH 2 TIMES DAILY AS NEEDED FOR ANXIETY.  . pantoprazole (PROTONIX) 40 MG tablet Take 40 mg by mouth daily.  . traMADol (ULTRAM) 50 MG tablet tramadol 50 mg tablet  Take 1 tablet every 6 hours by oral route as needed for 5 days.  . [DISCONTINUED] aspirin 81 MG chewable tablet Chew by mouth daily.   Past Medical History:  Diagnosis Date  . Allergic rhinitis   . Allergy    . Anal fissure   . Anxiety   . Cataract    starting  . Coronary atherosclerosis of native coronary artery 2007   nonobstructive CAD by cath with 40% mid-LAD stenosis  . Degenerative arthritis of knee, bilateral 10/09/2017  . Depression   . Diverticulosis 12/18/2011  . Diverticulosis of colon with hemorrhage    April 2021, [facility] healthcare  . Fibromyalgia   . GERD with stricture   . Helicobacter pylori (H. pylori) infection 11/25/2012   11/2012 EGD + gastric bxs  . Hiatal hernia   . HLD (hyperlipidemia)   . HTN (hypertension)   . Hyperlipidemia   . IBS (irritable bowel syndrome)   . Impotence of organic origin   . Insomnia   . Internal and external hemorrhoids without complication   . Interstitial cystitis   . Irritable bowel syndrome 06/09/2010   Qualifier: Diagnosis of  By: Carlean Purl MD, Tonna Boehringer E   . Pelvic floor dysfunction 11/29/2017  . Reactive hypoglycemia   . Rheumatoid arthritis(714.0)   . 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 Vitamin D deficiency 11/29/2017   Past Surgical History:  Procedure Laterality Date  . bladder distention     x 6  . CATHETER REMOVAL     super pubic area  . COLONOSCOPY    . DENTAL SURGERY Right    #30 tooth extraction (right upper)  . INTERSTIM IMPLANT PLACEMENT    . INTERSTIM IMPLANT REMOVAL    . KNEE ARTHROSCOPY     right x 2  . NISSEN FUNDOPLICATION  4287  . SHOULDER ARTHROSCOPY  2010   left  . TONSILLECTOMY AND ADENOIDECTOMY  1957  . TRANSURETHRAL RESECTION OF PROSTATE     x 2  . UPPER GASTROINTESTINAL ENDOSCOPY  05/13/2008   hiatal hernia  . 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   Review of  Systems  As per HPI o Objective:   Physical Exam BP 110/78   Pulse 88   Ht 5\' 7"  (1.702 m)   Wt 168 lb 12.8 oz (76.6 kg)   BMI 26.44 kg/m  NAD Lungs cta Cor NL S1S2 no rmg Chest wall - tender xiphoid  - neg [place], sternum ok abd diastasis recti - nontender

## 2021-01-10 DIAGNOSIS — M542 Cervicalgia: Secondary | ICD-10-CM | POA: Diagnosis not present

## 2021-01-10 DIAGNOSIS — M1711 Unilateral primary osteoarthritis, right knee: Secondary | ICD-10-CM | POA: Diagnosis not present

## 2021-01-10 DIAGNOSIS — M519 Unspecified thoracic, thoracolumbar and lumbosacral intervertebral disc disorder: Secondary | ICD-10-CM | POA: Diagnosis not present

## 2021-01-13 ENCOUNTER — Other Ambulatory Visit: Payer: Self-pay | Admitting: Internal Medicine

## 2021-01-13 MED ORDER — METOCLOPRAMIDE HCL 10 MG PO TABS: 10.0000 mg | ORAL_TABLET | Freq: Every day | ORAL | 1 refills | Status: DC

## 2021-01-19 ENCOUNTER — Encounter: Payer: Self-pay | Admitting: Internal Medicine

## 2021-01-19 DIAGNOSIS — M6281 Muscle weakness (generalized): Secondary | ICD-10-CM | POA: Diagnosis not present

## 2021-01-19 DIAGNOSIS — M2560 Stiffness of unspecified joint, not elsewhere classified: Secondary | ICD-10-CM | POA: Diagnosis not present

## 2021-01-19 DIAGNOSIS — M542 Cervicalgia: Secondary | ICD-10-CM | POA: Diagnosis not present

## 2021-01-23 DIAGNOSIS — M542 Cervicalgia: Secondary | ICD-10-CM | POA: Diagnosis not present

## 2021-01-23 DIAGNOSIS — Z932 Ileostomy status: Secondary | ICD-10-CM | POA: Diagnosis not present

## 2021-01-23 DIAGNOSIS — M2560 Stiffness of unspecified joint, not elsewhere classified: Secondary | ICD-10-CM | POA: Diagnosis not present

## 2021-01-23 DIAGNOSIS — M6281 Muscle weakness (generalized): Secondary | ICD-10-CM | POA: Diagnosis not present

## 2021-01-24 ENCOUNTER — Encounter: Payer: Self-pay | Admitting: Internal Medicine

## 2021-01-24 ENCOUNTER — Ambulatory Visit (AMBULATORY_SURGERY_CENTER): Payer: Medicare Other | Admitting: Internal Medicine

## 2021-01-24 ENCOUNTER — Other Ambulatory Visit: Payer: Self-pay

## 2021-01-24 VITALS — BP 116/68 | HR 66 | Temp 97.8°F | Resp 12 | Ht 67.0 in | Wt 168.0 lb

## 2021-01-24 DIAGNOSIS — K2970 Gastritis, unspecified, without bleeding: Secondary | ICD-10-CM | POA: Diagnosis not present

## 2021-01-24 DIAGNOSIS — K209 Esophagitis, unspecified without bleeding: Secondary | ICD-10-CM

## 2021-01-24 DIAGNOSIS — K222 Esophageal obstruction: Secondary | ICD-10-CM | POA: Diagnosis not present

## 2021-01-24 DIAGNOSIS — K2100 Gastro-esophageal reflux disease with esophagitis, without bleeding: Secondary | ICD-10-CM

## 2021-01-24 DIAGNOSIS — R1319 Other dysphagia: Secondary | ICD-10-CM

## 2021-01-24 DIAGNOSIS — M797 Fibromyalgia: Secondary | ICD-10-CM | POA: Diagnosis not present

## 2021-01-24 DIAGNOSIS — R1310 Dysphagia, unspecified: Secondary | ICD-10-CM | POA: Diagnosis not present

## 2021-01-24 DIAGNOSIS — K2210 Ulcer of esophagus without bleeding: Secondary | ICD-10-CM | POA: Diagnosis not present

## 2021-01-24 DIAGNOSIS — K2950 Unspecified chronic gastritis without bleeding: Secondary | ICD-10-CM | POA: Diagnosis not present

## 2021-01-24 MED ORDER — SODIUM CHLORIDE 0.9 % IV SOLN: 500.0000 mL | Freq: Once | INTRAVENOUS | Status: DC

## 2021-01-24 MED ORDER — LANSOPRAZOLE 30 MG PO CPDR: 30.0000 mg | DELAYED_RELEASE_CAPSULE | Freq: Every day | ORAL | 1 refills | Status: DC

## 2021-01-24 NOTE — Progress Notes (Signed)
Called to room to assist during endoscopic procedure.  Patient ID and intended procedure confirmed with present staff. Received instructions for my participation in the procedure from the performing physician.  

## 2021-01-24 NOTE — Progress Notes (Signed)
Report to PACU, RN, vss, BBS= Clear.  

## 2021-01-24 NOTE — Progress Notes (Signed)
During admitting process pt. Stated "my heart stopped during my last surgery,july 16th 2020,immediately went to come and informed Doctor and CRNA of pt. Statement,doctor informed me that we will proceed with procedure.  Check-in-cw

## 2021-01-24 NOTE — Op Note (Addendum)
[place] Patient Name: [name] Procedure Date: 01/24/2021 9:43 AM MRN: [id number] Endoscopist: Gatha Mayer , MD Age: 75 Referring MD:  Date of Birth: [birth year] Gender: Male Account #: [id number] Procedure:                Upper GI endoscopy Indications:              Dysphagia, Odynophagia, Heartburn, Esophageal                            reflux symptoms that persist despite appropriate                            therapy Medicines:                Propofol per Anesthesia, Monitored Anesthesia Care Procedure:                Pre-Anesthesia Assessment:                           - Prior to the procedure, a History and Physical                            was performed, and patient medications and                            allergies were reviewed. The patient's tolerance of                            previous anesthesia was also reviewed. The risks                            and benefits of the procedure and the sedation                            options and risks were discussed with the patient.                            All questions were answered, and informed consent                            was obtained. Prior Anticoagulants: The patient has                            taken no previous anticoagulant or antiplatelet                            agents. ASA Grade Assessment: II - A patient with                            mild systemic disease. After reviewing the risks                            and benefits, the patient was deemed in  satisfactory condition to undergo the procedure.                           After obtaining informed consent, the endoscope was                            passed under direct vision. Throughout the                            procedure, the patient's blood pressure, pulse, and                            oxygen saturations were monitored continuously. The                            Endoscope was introduced  through the mouth, and                            advanced to the second part of duodenum. The upper                            GI endoscopy was accomplished without difficulty.                            The patient tolerated the procedure well. Scope In: Scope Out: Findings:                 LA Grade A (one or more mucosal breaks less than 5                            mm, not extending between tops of 2 mucosal folds)                            esophagitis with no bleeding was found in the                            distal esophagus. Biopsies were taken with a cold                            forceps for histology. Verification of patient                            identification for the specimen was done. Estimated                            blood loss was minimal.                           One benign-appearing, intrinsic moderate                            (circumferential scarring or stenosis; an endoscope  may pass) stenosis was found at the                            gastroesophageal junction. The stenosis was                            traversed. A TTS dilator was passed through the                            scope. Dilation with an 18-19-20 mm balloon dilator                            was performed to 20 mm. The dilation site was                            examined and showed moderate mucosal disruption.                            Estimated blood loss was minimal.                           Evidence of a Nissen fundoplication was found in                            the cardia. The wrap appeared intact. This was                            traversed.                           Diffuse mild inflammation characterized by                            erythema, friability, mottling and mucus was found                            in the gastric antrum. Biopsies were taken with a                            cold forceps for histology. Verification of patient                             identification for the specimen was done. Estimated                            blood loss was minimal.                           The exam was otherwise without abnormality.                           The cardia and gastric fundus were normal on  retroflexion. Complications:            No immediate complications. Estimated Blood Loss:     Estimated blood loss was minimal. Impression:               - LA Grade A reflux esophagitis with no bleeding.                            Biopsied.                           - Benign-appearing esophageal stenosis. Dilated.                           - A Nissen fundoplication was found. The wrap                            appears intact.                           - Gastritis. Mottled mucosa Biopsied.                           - The examination was otherwise normal. Recommendation:           - Patient has a contact number available for                            emergencies. The signs and symptoms of potential                            delayed complications were discussed with the                            patient. Return to normal activities tomorrow.                            Written discharge instructions were provided to the                            patient.                           - Clear liquids x 1 hour then soft foods rest of                            day. Start prior diet tomorrow.                           - Continue present medications.                           - Await pathology results.                           - Start previously prescribed panrtoprazole  ADDENDUM - TRY LANSOPRAZOLE INSTEAD ? PPI CAUSE                            JOINT PAINS BUT NEED TO TRY ANOTHER Gatha Mayer, MD 01/24/2021 10:17:50 AM This report has been signed electronically.

## 2021-01-24 NOTE — Patient Instructions (Addendum)
There was some inflammation in the stomach mild.  I biopsied that and there was some inflammation at the end of the esophagus that I biopsied.  It is probably from reflux.  It did look narrowed or strictured where the esophagus and stomach meet and I dilated that.  Lets see how you feel after the dilation and let me follow-up on the biopsies and I will contact you.  Start taking the Lansoprazole, stop pantoprazole due to complaints of joint pain.  I appreciate the opportunity to care for you. Gatha Mayer, MD, [facility]  Handouts given for Gastritis, GERD, Esophagitis and stricture and Post-Dilation diet.     YOU HAD AN ENDOSCOPIC PROCEDURE TODAY AT [facility]:   Refer to the procedure report that was given to you for any specific questions about what was found during the examination.  If the procedure report does not answer your questions, please call your gastroenterologist to clarify.  If you requested that your care partner not be given the details of your procedure findings, then the procedure report has been included in a sealed envelope for you to review at your convenience later.  YOU SHOULD EXPECT: Some feelings of bloating in the abdomen. Passage of more gas than usual.  Walking can help get rid of the air that was put into your GI tract during the procedure and reduce the bloating. If you had a lower endoscopy (such as a colonoscopy or flexible sigmoidoscopy) you may notice spotting of blood in your stool or on the toilet paper. If you underwent a bowel prep for your procedure, you may not have a normal bowel movement for a few days.  Please Note:  You might notice some irritation and congestion in your nose or some drainage.  This is from the oxygen used during your procedure.  There is no need for concern and it should clear up in a day or so.    SYMPTOMS TO REPORT IMMEDIATELY:   Following upper endoscopy (EGD)  Vomiting of blood or coffee ground  material  New chest pain or pain under the shoulder blades  Painful or persistently difficult swallowing  New shortness of breath  Fever of 100F or higher  Black, tarry-looking stools  For urgent or emergent issues, a gastroenterologist can be reached at any hour by calling [phone]. Do not use MyChart messaging for urgent concerns.    DIET:  SEE POST DILATION DIET HANDOUT, CLEAR LIQUIDS FOR ONE HOUR THEN SOFT DIET THE REST OF TODAY, TOMORROW may proceed to your regular diet AS TOLERATED.  Drink plenty of fluids but you should avoid alcoholic beverages for 24 hours.  ACTIVITY:  You should plan to take it easy for the rest of today and you should NOT DRIVE or use heavy machinery until tomorrow (because of the sedation medicines used during the test).    FOLLOW UP: Our staff will call the number listed on your records 48-72 hours following your procedure to check on you and address any questions or concerns that you may have regarding the information given to you following your procedure. If we do not reach you, we will leave a message.  We will attempt to reach you two times.  During this call, we will ask if you have developed any symptoms of COVID 19. If you develop any symptoms (ie: fever, flu-like symptoms, shortness of breath, cough etc.) before then, please call [phone].  If you test positive for Covid 19 in the 2 weeks  post procedure, please call and report this information to [name].    If any biopsies were taken you will be contacted by phone or by letter within the next 1-3 weeks.  Please call us at [phone] if you have not heard about the biopsies in 3 weeks.    SIGNATURES/CONFIDENTIALITY: You and/or your care partner have signed paperwork which will be entered into your electronic medical record.  These signatures attest to the fact that that the information above on your After Visit Summary has been reviewed and is understood.  Full responsibility of the  confidentiality of this discharge information lies with you and/or your care-partner.

## 2021-01-25 DIAGNOSIS — M2560 Stiffness of unspecified joint, not elsewhere classified: Secondary | ICD-10-CM | POA: Diagnosis not present

## 2021-01-25 DIAGNOSIS — M542 Cervicalgia: Secondary | ICD-10-CM | POA: Diagnosis not present

## 2021-01-25 DIAGNOSIS — M6281 Muscle weakness (generalized): Secondary | ICD-10-CM | POA: Diagnosis not present

## 2021-01-26 ENCOUNTER — Telehealth: Payer: Self-pay

## 2021-01-26 NOTE — Telephone Encounter (Signed)
  Follow up Call-  Call back number 01/24/2021  Post procedure Call Back phone  # [phone]  Permission to leave phone message Yes  Some recent data might be hidden     Patient questions:  Do you have a fever, pain , or abdominal swelling? No. Pain Score  0 *  Have you tolerated food without any problems? Yes.    Have you been able to return to your normal activities? Yes.    Do you have any questions about your discharge instructions: Diet   No. Medications  No. Follow up visit  No.  Do you have questions or concerns about your Care? No.  Actions: * If pain score is 4 or above: No action needed, pain <4.  1. Have you developed a fever since your procedure? no  2.   Have you had an respiratory symptoms (SOB or cough) since your procedure? no  3.   Have you tested positive for COVID 19 since your procedure no  4.   Have you had any family members/close contacts diagnosed with the COVID 19 since your procedure?  no   If yes to any of these questions please route to Joylene John, RN and Joella Prince, RN

## 2021-01-31 DIAGNOSIS — M542 Cervicalgia: Secondary | ICD-10-CM | POA: Diagnosis not present

## 2021-01-31 DIAGNOSIS — M2560 Stiffness of unspecified joint, not elsewhere classified: Secondary | ICD-10-CM | POA: Diagnosis not present

## 2021-01-31 DIAGNOSIS — M6281 Muscle weakness (generalized): Secondary | ICD-10-CM | POA: Diagnosis not present

## 2021-02-02 ENCOUNTER — Other Ambulatory Visit: Payer: Self-pay | Admitting: Internal Medicine

## 2021-02-02 DIAGNOSIS — M542 Cervicalgia: Secondary | ICD-10-CM | POA: Diagnosis not present

## 2021-02-02 DIAGNOSIS — M2560 Stiffness of unspecified joint, not elsewhere classified: Secondary | ICD-10-CM | POA: Diagnosis not present

## 2021-02-02 DIAGNOSIS — M6281 Muscle weakness (generalized): Secondary | ICD-10-CM | POA: Diagnosis not present

## 2021-02-02 MED ORDER — SUCRALFATE 1 G PO TABS: 1.0000 g | ORAL_TABLET | Freq: Three times a day (TID) | ORAL | 1 refills | Status: DC

## 2021-02-02 NOTE — Progress Notes (Deleted)
   Erik DYAL Sr. 75 y.o. Dec 03, 1945 [id number]  Assessment & Plan:      Subjective:   Chief Complaint:  HPI  Allergies  Allergen Reactions  . Ambien [Zolpidem]   . Bentyl [Dicyclomine]     Memory loss, anxiety, blurred vision  . Lexapro [Escitalopram Oxalate]   . Pneumovax [Pneumococcal Polysaccharide Vaccine] Other (See Comments)    pain  . Prilosec [Omeprazole] Other (See Comments)    Per patient joint pain with PPI's  . Remeron [Mirtazapine]   . Statins Other (See Comments)    Joint pain  . Tramadol Other (See Comments)  . Tylenol [Acetaminophen]    No outpatient medications have been marked as taking for the 02/02/21 encounter (Orders Only) with Gatha Mayer, MD.   Past Medical History:  Diagnosis Date  . Allergic rhinitis   . Allergy   . Anal fissure   . Anemia   . Anxiety   . Blood transfusion without reported diagnosis   . Cataract    starting  . Coronary atherosclerosis of native coronary artery 2007   nonobstructive CAD by cath with 40% mid-LAD stenosis  . Degenerative arthritis of knee, bilateral 10/09/2017  . Depression   . Diverticulosis 12/18/2011  . Diverticulosis of colon with hemorrhage    April 2021, [facility]  . Fibromyalgia   . GERD with stricture   . Helicobacter pylori (H. pylori) infection 11/25/2012   11/2012 EGD + gastric bxs  . Hiatal hernia   . HLD (hyperlipidemia)   . HTN (hypertension)   . Hyperlipidemia   . IBS (irritable bowel syndrome)   . Impotence of organic origin   . Insomnia   . Internal and external hemorrhoids without complication   . Interstitial cystitis   . Irritable bowel syndrome 06/09/2010   Qualifier: Diagnosis of  By: Carlean Purl MD, [facility]   . Osteoporosis   . Pelvic floor dysfunction 11/29/2017  . Reactive hypoglycemia   . Rheumatoid arthritis(714.0)   . 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 Vitamin D deficiency 11/29/2017   Past Surgical History:  Procedure Laterality Date  . bladder  distention     x 6  . CATHETER REMOVAL     super pubic area  . COLONOSCOPY    . DENTAL SURGERY Right    #30 tooth extraction (right upper)  . INTERSTIM IMPLANT PLACEMENT    . INTERSTIM IMPLANT REMOVAL    . KNEE ARTHROSCOPY     right x 2  . NISSEN FUNDOPLICATION  4196  . PROSTECTOMY    . SHOULDER ARTHROSCOPY  2010   left  . TONSILLECTOMY AND ADENOIDECTOMY  1957  . TRANSURETHRAL RESECTION OF PROSTATE     x 2  . UPPER GASTROINTESTINAL ENDOSCOPY  05/13/2008   hiatal hernia  . VASECTOMY     Social History   Social History Narrative   Married, retired for children   He is a former smoker no alcohol or substance use   family history includes Breast cancer in his sister; Colon polyps in his father; Diabetes in his father, paternal uncle, and sister; Heart disease in his father and mother; Hypertension in his father and mother; Liver cancer in his maternal grandfather and paternal uncle; Stroke in his mother.   Review of Systems   Objective:   Physical Exam

## 2021-02-07 DIAGNOSIS — M6281 Muscle weakness (generalized): Secondary | ICD-10-CM | POA: Diagnosis not present

## 2021-02-07 DIAGNOSIS — M2560 Stiffness of unspecified joint, not elsewhere classified: Secondary | ICD-10-CM | POA: Diagnosis not present

## 2021-02-07 DIAGNOSIS — M542 Cervicalgia: Secondary | ICD-10-CM | POA: Diagnosis not present

## 2021-02-09 DIAGNOSIS — M2560 Stiffness of unspecified joint, not elsewhere classified: Secondary | ICD-10-CM | POA: Diagnosis not present

## 2021-02-09 DIAGNOSIS — M542 Cervicalgia: Secondary | ICD-10-CM | POA: Diagnosis not present

## 2021-02-09 DIAGNOSIS — M6281 Muscle weakness (generalized): Secondary | ICD-10-CM | POA: Diagnosis not present

## 2021-02-16 DIAGNOSIS — H40003 Preglaucoma, unspecified, bilateral: Secondary | ICD-10-CM | POA: Diagnosis not present

## 2021-02-16 DIAGNOSIS — H2513 Age-related nuclear cataract, bilateral: Secondary | ICD-10-CM | POA: Diagnosis not present

## 2021-02-20 DIAGNOSIS — M6281 Muscle weakness (generalized): Secondary | ICD-10-CM | POA: Diagnosis not present

## 2021-02-20 DIAGNOSIS — M2560 Stiffness of unspecified joint, not elsewhere classified: Secondary | ICD-10-CM | POA: Diagnosis not present

## 2021-02-20 DIAGNOSIS — M542 Cervicalgia: Secondary | ICD-10-CM | POA: Diagnosis not present

## 2021-02-27 DIAGNOSIS — Z932 Ileostomy status: Secondary | ICD-10-CM | POA: Diagnosis not present

## 2021-03-05 NOTE — Progress Notes (Signed)
Office Visit Note  Patient: Erik PINKHAM Sr.             Date of Birth: 03-Jul-1946           MRN: [id number]             PCP: [name], MD Referring: [name], MD Visit Date: 03/06/2021   Subjective:  New Patient (Initial Visit) (Patient has hx of RA, patient has never been on treatment. Patient complains of neck and bilateral shoulder pain radiating into bilateral upper extremities, right knee pain. )   History of Present Illness: Erik WOLAK Sr. is a 75 y.o. male here for evaluation of rheumatoid arthritis. He was a patient of Dr. Elmon Else for many years from 2001-2016 during which time he was treated conservatively for chronic joint pain with tramadol, NSAIDs, He was followed for over a decade for his significant, chronic joint pains with no evidence of inflammatory and erosive disease during that time. However symptoms were attributed to osteoarthritis changes and fibromyalgia syndrome related to his chronic pain. Symptoms including fatigue, pain, IBS, interstitial cystitis, headache, cognitive difficulty. Currently shoulder pain is bad also bilateral hips and low back and knees causing problems walking around and standing upright.  Activities of Daily Living:  Patient reports morning stiffness for 0-24 hours.   Patient Reports nocturnal pain.  Difficulty dressing/grooming: Reports Difficulty climbing stairs: Reports Difficulty getting out of chair: Reports Difficulty using hands for taps, buttons, cutlery, and/or writing: Reports  Review of Systems  Constitutional: Positive for fatigue.  HENT: Positive for mouth sores, mouth dryness and nose dryness.   Eyes: Positive for pain, itching, visual disturbance and dryness.  Respiratory: Negative for cough, hemoptysis, shortness of breath and difficulty breathing.   Cardiovascular: Positive for palpitations and swelling in legs/feet. Negative for chest pain.  Gastrointestinal: Positive for abdominal pain, constipation and  diarrhea. Negative for blood in stool.  Endocrine: Negative for increased urination.  Genitourinary: Negative for painful urination.  Musculoskeletal: Positive for arthralgias, joint pain, myalgias, muscle weakness, morning stiffness, muscle tenderness and myalgias. Negative for joint swelling.  Skin: Negative for color change, rash and redness.  Allergic/Immunologic: Negative for susceptible to infections.  Neurological: Positive for dizziness, numbness, headaches, memory loss and weakness.  Hematological: Positive for swollen glands.  Psychiatric/Behavioral: Positive for confusion and sleep disturbance.    PMFS History:  Patient Active Problem List   Diagnosis Date Noted  . Paresthesia of hand, bilateral 11/27/2020  . 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 Confusion 11/10/2020  . COVID-19 virus infection 10/02/2020  . Anemia 03/03/2020  . S/P Nissen fundoplication (without gastrostomy tube) procedure 02/21/2020  . Gastrointestinal hemorrhage with melena 02/18/2020  . Diarrhea 12/14/2019  . Stitch granuloma 08/06/2019  . History of prostatectomy 06/12/2019  . 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 History of total cystectomy 06/11/2019  . Cervical radiculopathy 04/11/2018  . Bilateral leg pain 02/25/2018  . Pain in joint of left shoulder 12/10/2017  . Pelvic floor dysfunction 11/29/2017  . Vitamin D deficiency 11/29/2017  . CAD (coronary artery disease) 10/29/2017  . 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  . Elevated PSA 10/09/2017  . Reactive hypoglycemia 10/09/2017  . Degenerative arthritis of knee, bilateral 10/09/2017  . Upper respiratory tract infection 08/17/2017  . Family history of osteoporosis 04/02/2017  . Senile purpura ([place]) 10/14/2016  . Medication intolerances 06/26/2016  . Dyspepsia 06/26/2016  . BPH (benign prostatic hypertrophy) with urinary obstruction 03/30/2016  . Abnormal CT of the  abdomen 11/10/2015  . Right leg pain 09/01/2015  . Left  shoulder pain 09/01/2015  . Fatigue 06/28/2015  . Bladder pain 03/25/2015  . Polyarthralgia 02/02/2015  . Abdominal pain, epigastric 02/01/2015  . Bloating 02/01/2015  . Bradycardia with 31 - 40 beats per minute 06/18/2014  . Chest pain 03/16/2013  . Hyperlipidemia 02/18/2013  . Helicobacter pylori (H. pylori) infection 11/25/2012  . 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 Encounter for well adult exam with abnormal findings 02/23/2012  . Allergic rhinitis   . Insomnia   . Diverticulitis   . GERD with stricture   . IBS (irritable bowel syndrome)   . Helicobacter pylori gastritis   . Internal and external hemorrhoids without complication   . Depression 10/15/2011  . 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 Irritable bowel syndrome 06/09/2010  . Interstitial cystitis 06/09/2010  . Fibromyalgia 06/09/2010  . FLATULENCE-GAS-BLOATING 06/09/2010    Past Medical History:  Diagnosis Date  . Allergic rhinitis   . Allergy   . Anal fissure   . Anemia   . Anxiety   . Blood transfusion without reported diagnosis   . Cataract    starting  . Coronary atherosclerosis of native coronary artery 2007   nonobstructive CAD by cath with 40% mid-LAD stenosis  . Degenerative arthritis of knee, bilateral 10/09/2017  . Depression   . Diverticulosis 12/18/2011  . Diverticulosis of colon with hemorrhage    April 2021, [facility]  . Fibromyalgia   . GERD with stricture   . Helicobacter pylori (H. pylori) infection 11/25/2012   11/2012 EGD + gastric bxs  . Hiatal hernia   . HLD (hyperlipidemia)   . HTN (hypertension)   . Hyperlipidemia   . IBS (irritable bowel syndrome)   . Impotence of organic origin   . Insomnia   . Internal and external hemorrhoids without complication   . Interstitial cystitis   . Irritable bowel syndrome 06/09/2010   Qualifier: Diagnosis of  By: Carlean Purl MD, Dimas Millin   . Osteoporosis   . Pelvic floor dysfunction 11/29/2017  . Reactive hypoglycemia    . Rheumatoid arthritis(714.0)   . 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 Vitamin D deficiency 11/29/2017    Family History  Problem Relation Age of Onset  . Colon polyps Father   . Diabetes Father   . Heart disease Father   . Hypertension Father   . Heart disease Mother   . Hypertension Mother   . Stroke Mother   . Arthritis Mother   . Diabetes Sister   . Breast cancer Sister   . Diabetes Paternal Uncle   . Liver cancer Paternal Uncle   . Liver cancer Maternal Grandfather   . Colon cancer Neg Hx   . Esophageal cancer Neg Hx   . Rectal cancer Neg Hx   . Stomach cancer Neg Hx   . Pancreatic cancer Neg Hx    Past Surgical History:  Procedure Laterality Date  . bladder distention     x 6  . CATHETER REMOVAL     super pubic area  . COLONOSCOPY    . DENTAL SURGERY Right    #30 tooth extraction (right upper)  . INTERSTIM IMPLANT PLACEMENT    . INTERSTIM IMPLANT REMOVAL    . KNEE ARTHROSCOPY     right x 2  . NISSEN FUNDOPLICATION  [id number]  . PROSTECTOMY    . SHOULDER ARTHROSCOPY  2010   left  . TONSILLECTOMY AND ADENOIDECTOMY  1957  . TRANSURETHRAL  RESECTION OF PROSTATE     x 2  . UPPER GASTROINTESTINAL ENDOSCOPY  05/13/2008   hiatal hernia  . VASECTOMY     Social History   Social History Narrative   Married, retired for children   He is a former smoker no alcohol or substance use   Immunization History  Administered Date(s) Administered  . Moderna Sars-Covid-2 Vaccination 01/11/2020, 02/08/2020  . Pneumococcal Polysaccharide-23 07/03/2013  . Pneumococcal-Unspecified 11/12/2012  . Tdap 08/23/2014     Objective: Vital Signs: BP (!) 145/76 (BP Location: Right Arm, Patient Position: Sitting, Cuff Size: Normal)   Pulse 71   Ht 5\' 6"  (1.676 m)   Wt 172 lb 12.8 oz (78.4 kg)   BMI 27.89 kg/m    Physical Exam HENT:     Right Ear: External ear normal.     Left Ear: External ear normal.     Mouth/Throat:     Mouth: Mucous membranes are moist.     Pharynx: Oropharynx  is clear.  Eyes:     Conjunctiva/sclera: Conjunctivae normal.  Cardiovascular:     Rate and Rhythm: Normal rate and regular rhythm.  Pulmonary:     Effort: Pulmonary effort is normal.     Breath sounds: Normal breath sounds.  Skin:    General: Skin is warm and dry.     Findings: No rash.  Neurological:     General: No focal deficit present.     Mental Status: He is alert.     Deep Tendon Reflexes: Reflexes normal.  Psychiatric:        Mood and Affect: Mood normal.    Musculoskeletal Exam:  Neck full ROM no tenderness Shoulders full ROM no tenderness or swelling Elbows full ROM no tenderness or swelling Wrists full ROM no tenderness or swelling Fingers full ROM no tenderness or swelling Reduced internal rotation and adduction with Pace maneuver, mild lateral hip tenderness to palpation Knees slightly reduced flexion ROM but near normal, patellofemoral crepitus present, joint line tenderness and tight on lateral side and proximally Ankles full ROM no tenderness or swelling  Investigation: No additional findings.  Imaging: No results found.  Recent Labs: Lab Results  Component Value Date   WBC 9.7 12/29/2020   HGB 15.0 12/29/2020   PLT 325.0 12/29/2020   NA 138 11/10/2020   K 4.6 11/10/2020   CL 103 11/10/2020   CO2 29 11/10/2020   GLUCOSE 85 11/10/2020   BUN 23 11/10/2020   CREATININE 1.17 11/10/2020   BILITOT 0.4 11/10/2020   ALKPHOS 66 11/10/2020   AST 16 11/10/2020   ALT 13 11/10/2020   PROT 7.4 11/10/2020   ALBUMIN 4.3 11/10/2020   CALCIUM 9.5 11/10/2020   GFRAA 57 (L) 03/26/2017    Speciality Comments: No specialty comments available.  Procedures:  No procedures performed Allergies: Ambien [zolpidem], Bentyl [dicyclomine], Lexapro [escitalopram oxalate], Pneumovax [pneumococcal polysaccharide vaccine], Prilosec [omeprazole], Remeron [mirtazapine], Statins, Tramadol, and Tylenol [acetaminophen]   Assessment / Plan:     Visit Diagnoses: Osteoarthritis  of both knees, unspecified osteoarthritis type Bilateral leg pain  Bilateral knee pain with osteoarthritis but I also suspect his very tight low back and lateral hip muscles exacerbating symptom severity. No findings suggestive for inflammatory disease process on exam today. Recommended home exercises with IT band and glueteal muscle stretching. He has done recent PT for upper body issue but would have low threshold to refer for recommendations on exercises if not progressing.  Fibromyalgia  Does appear to have FMS symptoms with  the chronic pain and associated problems headache, concentration, IBS, interstitial cystitis. He is not on current medication for this that I can see, has apparently had issues with some SSRIs before not sure exact severity but might be a candidate for SNRI medication in the future.  Orders: No orders of the defined types were placed in this encounter.  No orders of the defined types were placed in this encounter.   Follow-Up Instructions: Return in about 7 weeks (around 04/24/2021) for Shoulder and knee pain, FMS, therapy.   Collier Salina, MD  Note - This record has been created using [employer].  Chart creation errors have been sought, but may not always  have been located. Such creation errors do not reflect on  the standard of medical care.

## 2021-03-06 ENCOUNTER — Ambulatory Visit: Payer: Medicare Other | Admitting: Internal Medicine

## 2021-03-06 ENCOUNTER — Other Ambulatory Visit: Payer: Self-pay

## 2021-03-06 ENCOUNTER — Encounter: Payer: Self-pay | Admitting: Internal Medicine

## 2021-03-06 VITALS — BP 145/76 | HR 71 | Ht 66.0 in | Wt 172.8 lb

## 2021-03-06 DIAGNOSIS — M79604 Pain in right leg: Secondary | ICD-10-CM

## 2021-03-06 DIAGNOSIS — M797 Fibromyalgia: Secondary | ICD-10-CM | POA: Diagnosis not present

## 2021-03-06 DIAGNOSIS — M170 Bilateral primary osteoarthritis of knee: Secondary | ICD-10-CM

## 2021-03-06 DIAGNOSIS — M6289 Other specified disorders of muscle: Secondary | ICD-10-CM

## 2021-03-06 DIAGNOSIS — M79605 Pain in left leg: Secondary | ICD-10-CM

## 2021-03-06 NOTE — Patient Instructions (Addendum)
I believe a lot of your current leg and knee pain is related to shortening and weakening of the muscles and tendons that attach from the side of the hips. I recommend stretching and strengthening exercises for this. Use of muscle relaxant medication and topical antiinflammatory medication may be helpful as well, especially early on.  You should aim to complete each stretch for at least 40-60 seconds total with each muscle group per day to increase range of motion.  I do not see evidence of inflammatory disease activity as a cause of symptoms currently.   If symptoms are not getting any better within 6-8 weeks we can take another look and may recommend formal therapy again.  Iliotibial Band Syndrome Rehab Stretching and range-of-motion exercises These exercises warm up your muscles and joints and improve the movement and flexibility of your hip and pelvis. Lunge This exercise is also called hip flexor stretch. 1. Kneel on the floor on your left / right knee. Bend your other knee so it is directly over your ankle. 2. Keep good posture with your head over your shoulders. Tuck your tailbone underneath you. This will prevent your back from arching too much. 3. You should feel a gentle stretch in the front of your back thigh or hip (hip flexors). If you do not feel a stretch, slowly lunge forward with your chest up. 4. Hold this position for __________ seconds. 5. Slowly return to the starting position. Repeat __________ times. Complete this exercise __________ times a day.    Quadriceps stretch, prone 1. Lie on your abdomen (prone position) on a firm surface, such as a bed or padded floor. 2. Bend your left / right knee and reach back to hold your ankle or pant leg. If you cannot reach your ankle or pant leg, loop a belt around your foot and grab the belt instead. 3. Gently pull your heel toward your buttocks. Your knee should not slide out to the side. You should feel a stretch in the front of  your thigh and knee (quadriceps). 4. Hold this position for __________ seconds. Repeat __________ times. Complete this exercise __________ times a day.   Iliotibial band stretch An iliotibial band is a strong band of muscle tissue that runs from the outer side of your hip to the outer side of your thigh and knee. 1. Lie on your side with your left / right leg in the top position. 2. Bend both of your knees and grab your left / right ankle. Stretch out your bottom arm to help you balance. 3. Slowly bring your top knee back so your thigh goes behind your trunk. 4. Slowly lower your top leg toward the floor until you feel a gentle stretch on the outside of your left / right hip and thigh. If you do not feel a stretch and your knee will not fall farther, place the heel of your other foot on top of your knee and pull your knee down toward the floor with your foot. 5. Hold this position for __________ seconds. Repeat __________ times. Complete this exercise __________ times a day.   Strengthening exercises These exercises build strength and endurance in your hip and pelvis. Endurance is the ability to use your muscles for a long time, even after they get tired. Straight leg raises, side-lying This exercise strengthens the muscles that rotate the leg at the hip and move it away from your body (hip abductors). 1. Lie on your side with your left / right leg  in the top position. Lie so your head, shoulder, hip, and knee line up. You may bend your bottom knee to help you balance. 2. Roll your hips slightly forward so your hips are stacked directly over each other and your left / right knee is facing forward. 3. Tense the muscles in your outer thigh and lift your top leg 4-6 inches (10-15 cm). 4. Hold this position for __________ seconds. 5. Slowly lower your leg to return to the starting position. Let your muscles relax completely before doing another repetition. Repeat __________ times. Complete this  exercise __________ times a day.   Leg raises, prone This exercise strengthens the muscles that move the hips backward (hip extensors). 1. Lie on your abdomen (prone position) on your bed or a firm surface. You can put a pillow under your hips if that is more comfortable for your lower back. 2. Bend your left / right knee so your foot is straight up in the air. 3. Squeeze your buttocks muscles and lift your left / right thigh off the bed. Do not let your back arch. 4. Tense your thigh muscle as hard as you can without increasing any knee pain. 5. Hold this position for __________ seconds. 6. Slowly lower your leg to return to the starting position and allow it to relax completely. Repeat __________ times. Complete this exercise __________ times a day. Hip hike 1. Stand sideways on a bottom step. Stand on your left / right leg with your other foot unsupported next to the step. You can hold on to a railing or wall for balance if needed. 2. Keep your knees straight and your torso square. Then lift your left / right hip up toward the ceiling. 3. Slowly let your left / right hip lower toward the floor, past the starting position. Your foot should get closer to the floor. Do not lean or bend your knees. Repeat __________ times. Complete this exercise __________ times a day.

## 2021-03-08 ENCOUNTER — Telehealth: Payer: Self-pay | Admitting: Internal Medicine

## 2021-03-08 DIAGNOSIS — M6289 Other specified disorders of muscle: Secondary | ICD-10-CM

## 2021-03-08 DIAGNOSIS — M797 Fibromyalgia: Secondary | ICD-10-CM

## 2021-03-08 DIAGNOSIS — M79604 Pain in right leg: Secondary | ICD-10-CM

## 2021-03-08 DIAGNOSIS — M79605 Pain in left leg: Secondary | ICD-10-CM

## 2021-03-08 DIAGNOSIS — M170 Bilateral primary osteoarthritis of knee: Secondary | ICD-10-CM

## 2021-03-08 NOTE — Telephone Encounter (Signed)
Patient calling stating the exercises doctor gave him, he is not able to do. Patient would like to know if you recommend anything else? Please call to advise.

## 2021-03-09 ENCOUNTER — Telehealth: Payer: Self-pay

## 2021-03-09 ENCOUNTER — Encounter: Payer: Self-pay | Admitting: Internal Medicine

## 2021-03-09 NOTE — Telephone Encounter (Signed)
Called patient to assist in scheduling an appointment. Left voicemail.

## 2021-03-10 ENCOUNTER — Other Ambulatory Visit: Payer: Self-pay

## 2021-03-10 NOTE — Telephone Encounter (Signed)
Attempted to contact patient, LVM advising patient to call the office. 

## 2021-03-10 NOTE — Telephone Encounter (Signed)
Spoke with patient, he would like PT referral. Referral has been ordered. Patient would like to go to [facility].

## 2021-03-10 NOTE — Telephone Encounter (Signed)
Would he be interested in seeing physical therapy for this problem? I believe his low back and hip flexibility is causing muscle spasticity and contributing to his chronic pain and we need to achieve some kind of exercise for it.

## 2021-03-13 ENCOUNTER — Encounter: Payer: Self-pay | Admitting: Internal Medicine

## 2021-03-13 ENCOUNTER — Ambulatory Visit (INDEPENDENT_AMBULATORY_CARE_PROVIDER_SITE_OTHER): Payer: Medicare Other | Admitting: Internal Medicine

## 2021-03-13 ENCOUNTER — Other Ambulatory Visit: Payer: Self-pay

## 2021-03-13 VITALS — BP 126/70 | HR 83 | Temp 98.2°F | Ht 66.0 in | Wt 170.0 lb

## 2021-03-13 DIAGNOSIS — E7849 Other hyperlipidemia: Secondary | ICD-10-CM

## 2021-03-13 DIAGNOSIS — M797 Fibromyalgia: Secondary | ICD-10-CM | POA: Diagnosis not present

## 2021-03-13 DIAGNOSIS — M791 Myalgia, unspecified site: Secondary | ICD-10-CM

## 2021-03-13 DIAGNOSIS — R5383 Other fatigue: Secondary | ICD-10-CM | POA: Diagnosis not present

## 2021-03-13 DIAGNOSIS — G4700 Insomnia, unspecified: Secondary | ICD-10-CM | POA: Diagnosis not present

## 2021-03-13 DIAGNOSIS — E559 Vitamin D deficiency, unspecified: Secondary | ICD-10-CM

## 2021-03-13 DIAGNOSIS — F419 Anxiety disorder, unspecified: Secondary | ICD-10-CM

## 2021-03-13 LAB — BASIC METABOLIC PANEL WITH GFR
BUN: 25 mg/dL — ABNORMAL HIGH (ref 6–23)
CO2: 26 meq/L (ref 19–32)
Calcium: 9.5 mg/dL (ref 8.4–10.5)
Chloride: 102 meq/L (ref 96–112)
Creatinine, Ser: 1.36 mg/dL (ref 0.40–1.50)
GFR: 51.45 mL/min — ABNORMAL LOW
Glucose, Bld: 122 mg/dL — ABNORMAL HIGH (ref 70–99)
Potassium: 3.9 meq/L (ref 3.5–5.1)
Sodium: 137 meq/L (ref 135–145)

## 2021-03-13 LAB — CBC WITH DIFFERENTIAL/PLATELET
Basophils Absolute: 0.1 K/uL (ref 0.0–0.1)
Basophils Relative: 0.9 % (ref 0.0–3.0)
Eosinophils Absolute: 0.1 K/uL (ref 0.0–0.7)
Eosinophils Relative: 1.1 % (ref 0.0–5.0)
HCT: 44 % (ref 39.0–52.0)
Hemoglobin: 14.8 g/dL (ref 13.0–17.0)
Lymphocytes Relative: 30.7 % (ref 12.0–46.0)
Lymphs Abs: 2.2 K/uL (ref 0.7–4.0)
MCHC: 33.6 g/dL (ref 30.0–36.0)
MCV: 88.8 fl (ref 78.0–100.0)
Monocytes Absolute: 0.7 K/uL (ref 0.1–1.0)
Monocytes Relative: 9.7 % (ref 3.0–12.0)
Neutro Abs: 4.1 K/uL (ref 1.4–7.7)
Neutrophils Relative %: 57.6 % (ref 43.0–77.0)
Platelets: 303 K/uL (ref 150.0–400.0)
RBC: 4.95 Mil/uL (ref 4.22–5.81)
RDW: 13.1 % (ref 11.5–15.5)
WBC: 7.1 K/uL (ref 4.0–10.5)

## 2021-03-13 LAB — HEPATIC FUNCTION PANEL
ALT: 14 U/L (ref 0–53)
AST: 17 U/L (ref 0–37)
Albumin: 4.3 g/dL (ref 3.5–5.2)
Alkaline Phosphatase: 68 U/L (ref 39–117)
Bilirubin, Direct: 0.1 mg/dL (ref 0.0–0.3)
Total Bilirubin: 0.4 mg/dL (ref 0.2–1.2)
Total Protein: 7.4 g/dL (ref 6.0–8.3)

## 2021-03-13 LAB — MAGNESIUM: Magnesium: 2.1 mg/dL (ref 1.5–2.5)

## 2021-03-13 LAB — TSH: TSH: 2.12 u[IU]/mL (ref 0.35–4.50)

## 2021-03-13 MED ORDER — TIZANIDINE HCL 2 MG PO TABS: 2.0000 mg | ORAL_TABLET | Freq: Four times a day (QID) | ORAL | 5 refills | Status: DC | PRN

## 2021-03-13 MED ORDER — CLONAZEPAM 0.5 MG PO TABS: ORAL_TABLET | ORAL | 2 refills | Status: DC

## 2021-03-13 NOTE — Patient Instructions (Signed)
Ok to change the ativan to klonopin as prescribed  Ok to change the flexeril to tizanidine which is safer for over 74 yo  Please continue all other medications as before, and refills have been done if requested.  Please have the pharmacy call with any other refills you may need.  Please continue your efforts at being more active, low cholesterol diet, and weight control.  Please keep your appointments with your specialists as you may have planned  Please go to the LAB at the blood drawing area for the tests to be done  You will be contacted by phone if any changes need to be made immediately.  Otherwise, you will receive a letter about your results with an explanation, but please check with MyChart first.  Please remember to sign up for MyChart if you have not done so, as this will be important to you in the future with finding out test results, communicating by private email, and scheduling acute appointments online when needed.

## 2021-03-13 NOTE — Progress Notes (Signed)
Patient ID: Erik Lolling Sr., male   DOB: 1946/03/12, 75 y.o.   MRN: [id number]        Chief Complaint: system tension       HPI:  Erik NITSCHKE Sr. is a 75 y.o. male here with c/o "systemic tension" and lists multiple areas including hand, leg, pelvic floor, abd, arms going to fall off, neck and upper back, and dysphagia/esophagus.  Denies worsening depressive symptoms, suicidal ideation, or panic; has ongoing anxiety not well controlled, trouble speaking at time, and memory difficulties, wondering about parkinsons since he has been reading online, and gait has been worsening, with a fall last PM.  Has seen rheumatology and given referral to PT but only seemed to make the pain worse.  He does say reflux has been improved recently, though insomnia has only gotten worse.         Wt Readings from Last 3 Encounters:  03/16/21 170 lb (77.1 kg)  03/13/21 170 lb (77.1 kg)  03/06/21 172 lb 12.8 oz (78.4 kg)   BP Readings from Last 3 Encounters:  03/16/21 136/64  03/13/21 126/70  03/06/21 (!) 145/76         Past Medical History:  Diagnosis Date  . Allergic rhinitis   . Allergy   . Anal fissure   . Anemia   . Anxiety   . Blood transfusion without reported diagnosis   . Cataract    starting  . Coronary atherosclerosis of native coronary artery 2007   nonobstructive CAD by cath with 40% mid-LAD stenosis  . Degenerative arthritis of knee, bilateral 10/09/2017  . Depression   . Diverticulosis 12/18/2011  . Diverticulosis of colon with hemorrhage    April 2021, [facility]  . Fibromyalgia   . GERD with stricture   . Helicobacter pylori (H. pylori) infection 11/25/2012   11/2012 EGD + gastric bxs  . Hiatal hernia   . HLD (hyperlipidemia)   . HTN (hypertension)   . Hyperlipidemia   . IBS (irritable bowel syndrome)   . Impotence of organic origin   . Insomnia   . Internal and external hemorrhoids without complication   . Interstitial cystitis   . Irritable bowel syndrome 06/09/2010    Qualifier: Diagnosis of  By: Carlean Purl MD, Dimas Millin   . Osteoporosis   . Pelvic floor dysfunction 11/29/2017  . Reactive hypoglycemia   . Rheumatoid arthritis(714.0)   . 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 Vitamin D deficiency 11/29/2017   Past Surgical History:  Procedure Laterality Date  . bladder distention     x 6  . CATHETER REMOVAL     super pubic area  . COLONOSCOPY    . DENTAL SURGERY Right    #30 tooth extraction (right upper)  . INTERSTIM IMPLANT PLACEMENT    . INTERSTIM IMPLANT REMOVAL    . KNEE ARTHROSCOPY     right x 2  . NISSEN FUNDOPLICATION  9562  . PROSTECTOMY    . SHOULDER ARTHROSCOPY  2010   left  . TONSILLECTOMY AND ADENOIDECTOMY  1957  . TRANSURETHRAL RESECTION OF PROSTATE     x 2  . UPPER GASTROINTESTINAL ENDOSCOPY  05/13/2008   hiatal hernia  . VASECTOMY      reports that he has quit smoking. He has never used smokeless tobacco. He reports that he does not drink alcohol and does not use drugs. family history includes Arthritis in his mother; Breast cancer in his sister; Colon polyps in his father; Diabetes in his father,  paternal uncle, and sister; Heart disease in his father and mother; Hypertension in his father and mother; Liver cancer in his maternal grandfather and paternal uncle; Stroke in his mother. Allergies  Allergen Reactions  . Ambien [Zolpidem]   . Bentyl [Dicyclomine]     Memory loss, anxiety, blurred vision  . Carafate [Sucralfate] Nausea And Vomiting  . Lansoprazole Diarrhea  . Lexapro [Escitalopram Oxalate]   . Pneumovax [Pneumococcal Polysaccharide Vaccine] Other (See Comments)    pain  . Prilosec [Omeprazole] Other (See Comments)    Per patient joint pain with PPI's  . Remeron [Mirtazapine]   . Seroquel [Quetiapine] Other (See Comments)    "terrible reaction"  . Statins Other (See Comments)    Joint pain  . Tramadol Other (See Comments)  . Tylenol [Acetaminophen]    Current Outpatient Medications on File Prior to Visit   Medication Sig Dispense Refill  . famotidine (PEPCID) 40 MG tablet Take 1 tablet (40 mg total) by mouth 2 (two) times daily. 180 tablet 3  . hydrOXYzine (ATARAX/VISTARIL) 10 MG tablet Take 1 tablet (10 mg total) by mouth at bedtime. (Patient taking differently: Take 10 mg by mouth as needed.) 90 tablet 3  . metoCLOPramide (REGLAN) 10 MG tablet Take 1 tablet (10 mg total) by mouth at bedtime. (Patient not taking: Reported on 03/16/2021) 30 tablet 1  . traMADol (ULTRAM) 50 MG tablet tramadol 50 mg tablet  Take 1 tablet every 6 hours by oral route as needed for 5 days.     No current facility-administered medications on file prior to visit.        ROS:  All others reviewed and negative.  Objective        PE:  BP 126/70 (BP Location: Right Arm, Patient Position: Sitting, Cuff Size: Normal)   Pulse 83   Temp 98.2 F (36.8 C) (Oral)   Ht 5\' 6"  (1.676 m)   Wt 170 lb (77.1 kg)   SpO2 97%   BMI 27.44 kg/m                 Constitutional: Pt appears in NAD               HENT: Head: NCAT.                Right Ear: External ear normal.                 Left Ear: External ear normal.                Eyes: . Pupils are equal, round, and reactive to light. Conjunctivae and EOM are normal               Nose: without d/c or deformity               Neck: Neck supple. Gross normal ROM               Cardiovascular: Normal rate and regular rhythm.                 Pulmonary/Chest: Effort normal and breath sounds without rales or wheezing.                Abd:  Soft, NT, ND, + BS, no organomegaly               Neurological: Pt is alert. At baseline orientation, motor grossly intact               Skin: Skin  is warm. No rashes, no other new lesions, LE edema - none               Psychiatric: Pt behavior is normal without agitation   Micro: none  Cardiac tracings I have personally interpreted today:  none  Pertinent Radiological findings (summarize): none   Lab Results  Component Value Date   WBC 7.1  03/13/2021   HGB 14.8 03/13/2021   HCT 44.0 03/13/2021   PLT 303.0 03/13/2021   GLUCOSE 122 (H) 03/13/2021   CHOL 163 06/30/2019   TRIG 132.0 06/30/2019   HDL 37.20 (L) 06/30/2019   LDLDIRECT 169.0 06/24/2018   LDLCALC 100 (H) 06/30/2019   ALT 14 03/13/2021   AST 17 03/13/2021   NA 137 03/13/2021   K 3.9 03/13/2021   CL 102 03/13/2021   CREATININE 1.36 03/13/2021   BUN 25 (H) 03/13/2021   CO2 26 03/13/2021   TSH 2.12 03/13/2021   PSA 0.00 (L) 06/30/2019   HGBA1C 5.9 06/30/2019   Assessment/Plan:  Erik Lolling Sr. is a 75 y.o. White or Caucasian [1] male with  has a past medical history of Allergic rhinitis, Allergy, Anal fissure, Anemia, Anxiety, Blood transfusion without reported diagnosis, Cataract, Coronary atherosclerosis of native coronary artery (2007), Degenerative arthritis of knee, bilateral (10/09/2017), Depression, Diverticulosis (12/18/2011), Diverticulosis of colon with hemorrhage, Fibromyalgia, GERD with stricture, Helicobacter pylori (H. pylori) infection (11/25/2012), Hiatal hernia, HLD (hyperlipidemia), HTN (hypertension), Hyperlipidemia, IBS (irritable bowel syndrome), Impotence of organic origin, Insomnia, Internal and external hemorrhoids without complication, Interstitial cystitis, Irritable bowel syndrome (06/09/2010), Osteoporosis, Pelvic floor dysfunction (11/29/2017), Reactive hypoglycemia, Rheumatoid arthritis(714.0), Somatization disorder (02/27/2012), and Vitamin D deficiency (11/29/2017).  Fibromyalgia Ok for trial change flexeril to tizanidine prn, check lab with magnesium,  to f/u any worsening symptoms or concerns; declines PT  Insomnia Ok for seroquel qhs prn  Fatigue Etiology unclear, Exam otherwise benign, to check labs as documented, follow with expectant management   Vitamin D deficiency Last vitamin D Lab Results  Component Value Date   VD25OH 36.16 11/10/2020   Stable, cont oral replacement   Hyperlipidemia Lab Results  Component Value  Date   LDLCALC 100 (H) 06/30/2019   Stable, pt to continue current low chol diet, declines statin   Anxiety Ok for trial change ativan to klonopin bid prn   Followup: Return if symptoms worsen or fail to improve.  Cathlean Cower, MD 03/19/2021 9:24 PM [facility]

## 2021-03-16 ENCOUNTER — Encounter: Payer: Self-pay | Admitting: Internal Medicine

## 2021-03-16 ENCOUNTER — Ambulatory Visit: Payer: Medicare Other | Admitting: Internal Medicine

## 2021-03-16 ENCOUNTER — Other Ambulatory Visit (INDEPENDENT_AMBULATORY_CARE_PROVIDER_SITE_OTHER): Payer: Medicare Other

## 2021-03-16 VITALS — BP 136/64 | HR 70 | Ht 66.0 in | Wt 170.0 lb

## 2021-03-16 DIAGNOSIS — R631 Polydipsia: Secondary | ICD-10-CM

## 2021-03-16 DIAGNOSIS — E611 Iron deficiency: Secondary | ICD-10-CM

## 2021-03-16 DIAGNOSIS — R0789 Other chest pain: Secondary | ICD-10-CM

## 2021-03-16 DIAGNOSIS — K222 Esophageal obstruction: Secondary | ICD-10-CM

## 2021-03-16 DIAGNOSIS — K582 Mixed irritable bowel syndrome: Secondary | ICD-10-CM

## 2021-03-16 DIAGNOSIS — K2970 Gastritis, unspecified, without bleeding: Secondary | ICD-10-CM

## 2021-03-16 DIAGNOSIS — K209 Esophagitis, unspecified without bleeding: Secondary | ICD-10-CM

## 2021-03-16 LAB — FERRITIN: Ferritin: 21.5 ng/mL — ABNORMAL LOW (ref 22.0–322.0)

## 2021-03-16 NOTE — Patient Instructions (Signed)
Your provider has requested that you go to the basement level for lab work before leaving today. Press "B" on the elevator. The lab is located at the first door on the left as you exit the elevator.  Due to recent changes in healthcare laws, you may see the results of your imaging and laboratory studies on MyChart before your provider has had a chance to review them.  We understand that in some cases there may be results that are confusing or concerning to you. Not all laboratory results come back in the same time frame and the provider may be waiting for multiple results in order to interpret others.  Please give [name] 48 hours in order for your provider to thoroughly review all the results before contacting the office for clarification of your results.   If you are age 41 or older, your body mass index should be between 23-30. Your Body mass index is 27.44 kg/m. If this is out of the aforementioned range listed, please consider follow up with your Primary Care Provider.  Thank you for choosing me and [facility].  Dr.Carl Carlean Purl

## 2021-03-16 NOTE — Progress Notes (Signed)
Erik LEWINSKI Sr. 75 y.o. 01-Jun-1946 [id number]  Assessment & Plan:   Encounter Diagnoses  Name Primary?  . Esophagitis Yes  . Esophageal stricture   . Atypical chest pain   . Iron deficiency   . Irritable bowel syndrome with both constipation and diarrhea   . Gastritis without bleeding, unspecified chronicity, unspecified gastritis type   . Excessive thirst     No significant changes right now.  Monitor if he has recurrent issues he can go back on his famotidine.  Antacids as needed are reasonable as well.  I am going to perform a Sjogren's antibody panel given his complaints of excessive thirst.  I suspect this will be negative but it will reassure him.  And if it is positive we will work that up further.  Further plans pending this.  CC: [name], MD  Subjective:   Chief Complaint:  HPI Patient is here for follow-up after recent EGD January 24, 2021 showed the following:  - LA Grade A reflux esophagitis with no bleeding. Biopsied. - Benign-appearing esophageal stenosis. Dilated. - A Nissen fundoplication was found. The wrap appears intact. - Gastritis. Mottled mucosa Biopsied. - The examination was otherwise normal.  Pathology demonstrated gastritis without H. pylori and severe acute ulcerative esophagitis no Candida  He has a lot of problems with PPI another drug sensitivities.  Lansoprazole gave him diarrhea so we stopped it.  Then he was on famotidine 40 mg daily and Reglan 10 mg at bedtime.  He was doing well.  He was able to wean off these medicines and was on apple cider vinegar.  Dr. Jenny Reichmann changed from lorazepam and Flexeril to Klonopin and ties entity recently.  Rush Landmark said he got chest pain which she had had a while and he is convinced it is from the [place].  He had 1 episode earlier today it was mild he drank milk and it was effective.  He goes on to say that he has a high metabolism and needs convinced that is the reason he is thirsty all the time.  He  also says he has dry eyes.  He does not recall ever being worked up for [employer]. Allergies  Allergen Reactions  . Ambien [Zolpidem]   . Bentyl [Dicyclomine]     Memory loss, anxiety, blurred vision  . Carafate [Sucralfate] Nausea And Vomiting  . Lansoprazole Diarrhea  . Lexapro [Escitalopram Oxalate]   . Pneumovax [Pneumococcal Polysaccharide Vaccine] Other (See Comments)    pain  . Prilosec [Omeprazole] Other (See Comments)    Per patient joint pain with PPI's  . Remeron [Mirtazapine]   . Statins Other (See Comments)    Joint pain  . Tramadol Other (See Comments)  . Tylenol [Acetaminophen]    Current Meds  Medication Sig  . clonazePAM (KLONOPIN) 0.5 MG tablet 1 tab by mouth in the AM and 2 in the PM as needed  . famotidine (PEPCID) 40 MG tablet Take 1 tablet (40 mg total) by mouth 2 (two) times daily. (Patient taking differently: Take 40 mg by mouth as needed.)  . hydrOXYzine (ATARAX/VISTARIL) 10 MG tablet Take 1 tablet (10 mg total) by mouth at bedtime. (Patient taking differently: Take 10 mg by mouth as needed.)  . metoCLOPramide (REGLAN) 10 MG tablet Take 1 tablet (10 mg total) by mouth at bedtime.  Marland Kitchen tiZANidine (ZANAFLEX) 2 MG tablet Take 1 tablet (2 mg total) by mouth every 6 (six) hours as needed for muscle spasms.  . traMADol (ULTRAM) 50  MG tablet tramadol 50 mg tablet  Take 1 tablet every 6 hours by oral route as needed for 5 days.   Past Medical History:  Diagnosis Date  . Allergic rhinitis   . Allergy   . Anal fissure   . Anemia   . Anxiety   . Blood transfusion without reported diagnosis   . Cataract    starting  . Coronary atherosclerosis of native coronary artery 2007   nonobstructive CAD by cath with 40% mid-LAD stenosis  . Degenerative arthritis of knee, bilateral 10/09/2017  . Depression   . Diverticulosis 12/18/2011  . Diverticulosis of colon with hemorrhage    April 2021, [facility]  . Fibromyalgia   . GERD with stricture   . Helicobacter pylori  (H. pylori) infection 11/25/2012   11/2012 EGD + gastric bxs  . Hiatal hernia   . HLD (hyperlipidemia)   . HTN (hypertension)   . Hyperlipidemia   . IBS (irritable bowel syndrome)   . Impotence of organic origin   . Insomnia   . Internal and external hemorrhoids without complication   . Interstitial cystitis   . Irritable bowel syndrome 06/09/2010   Qualifier: Diagnosis of  By: Carlean Purl MD, Dimas Millin   . Osteoporosis   . Pelvic floor dysfunction 11/29/2017  . Reactive hypoglycemia   . Rheumatoid arthritis(714.0)   . 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 Vitamin D deficiency 11/29/2017   Past Surgical History:  Procedure Laterality Date  . bladder distention     x 6  . CATHETER REMOVAL     super pubic area  . COLONOSCOPY    . DENTAL SURGERY Right    #30 tooth extraction (right upper)  . INTERSTIM IMPLANT PLACEMENT    . INTERSTIM IMPLANT REMOVAL    . KNEE ARTHROSCOPY     right x 2  . NISSEN FUNDOPLICATION  0240  . PROSTECTOMY    . SHOULDER ARTHROSCOPY  2010   left  . TONSILLECTOMY AND ADENOIDECTOMY  1957  . TRANSURETHRAL RESECTION OF PROSTATE     x 2  . UPPER GASTROINTESTINAL ENDOSCOPY  05/13/2008   hiatal hernia  . 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   Review of Systems As above  Objective:   Physical Exam BP 136/64   Pulse 70   Ht 5\' 6"  (1.676 m)   Wt 170 lb (77.1 kg)   BMI 27.44 kg/m  Lungs are clear Chest wall is nontender Heart sounds are normal  I reviewed recent primary care notes and labs in the EMR

## 2021-03-17 ENCOUNTER — Encounter: Payer: Self-pay | Admitting: Internal Medicine

## 2021-03-17 LAB — SJOGRENS SYNDROME-B EXTRACTABLE NUCLEAR ANTIBODY: SSB (La) (ENA) Antibody, IgG: <1 AI

## 2021-03-17 LAB — SJOGRENS SYNDROME-A EXTRACTABLE NUCLEAR ANTIBODY: SSA (Ro) (ENA) Antibody, IgG: <1 AI

## 2021-03-18 ENCOUNTER — Other Ambulatory Visit: Payer: Self-pay | Admitting: Internal Medicine

## 2021-03-18 MED ORDER — TRAZODONE HCL 50 MG PO TABS: 50.0000 mg | ORAL_TABLET | Freq: Every day | ORAL | 1 refills | Status: DC

## 2021-03-19 ENCOUNTER — Encounter: Payer: Self-pay | Admitting: Internal Medicine

## 2021-03-19 DIAGNOSIS — F419 Anxiety disorder, unspecified: Secondary | ICD-10-CM | POA: Insufficient documentation

## 2021-03-19 NOTE — Assessment & Plan Note (Signed)
Etiology unclear, Exam otherwise benign, to check labs as documented, follow with expectant management  

## 2021-03-19 NOTE — Assessment & Plan Note (Signed)
Ok for seroquel qhs prn

## 2021-03-19 NOTE — Assessment & Plan Note (Signed)
Last vitamin D Lab Results  Component Value Date   VD25OH 36.16 11/10/2020   Stable, cont oral replacement

## 2021-03-19 NOTE — Assessment & Plan Note (Addendum)
Ok for trial change flexeril to tizanidine prn, check lab with magnesium,  to f/u any worsening symptoms or concerns; declines PT

## 2021-03-19 NOTE — Assessment & Plan Note (Addendum)
Ok for trial change ativan to klonopin bid prn

## 2021-03-19 NOTE — Assessment & Plan Note (Signed)
Lab Results  Component Value Date   LDLCALC 100 (H) 06/30/2019   Stable, pt to continue current low chol diet, declines statin

## 2021-03-20 ENCOUNTER — Other Ambulatory Visit: Payer: Self-pay

## 2021-03-20 DIAGNOSIS — E611 Iron deficiency: Secondary | ICD-10-CM

## 2021-03-21 DIAGNOSIS — M25562 Pain in left knee: Secondary | ICD-10-CM | POA: Diagnosis not present

## 2021-03-21 DIAGNOSIS — M25561 Pain in right knee: Secondary | ICD-10-CM | POA: Diagnosis not present

## 2021-03-21 DIAGNOSIS — M6281 Muscle weakness (generalized): Secondary | ICD-10-CM | POA: Diagnosis not present

## 2021-03-21 DIAGNOSIS — M2560 Stiffness of unspecified joint, not elsewhere classified: Secondary | ICD-10-CM | POA: Diagnosis not present

## 2021-03-22 ENCOUNTER — Telehealth: Payer: Self-pay | Admitting: Hematology and Oncology

## 2021-03-22 NOTE — Telephone Encounter (Signed)
Received a hem referral from Dr. Carlean Purl for iron deficiency. [name] returned my car and has been scheduled to see Dr.  Chryl Heck on 5/12 at 1040am. Pt aware to arrive 20 minutes early.

## 2021-03-23 ENCOUNTER — Telehealth: Payer: Self-pay | Admitting: Hematology and Oncology

## 2021-03-23 ENCOUNTER — Inpatient Hospital Stay: Payer: Medicare Other | Attending: Hematology and Oncology | Admitting: Hematology and Oncology

## 2021-03-23 ENCOUNTER — Other Ambulatory Visit: Payer: Self-pay

## 2021-03-23 ENCOUNTER — Encounter: Payer: Self-pay | Admitting: Hematology and Oncology

## 2021-03-23 ENCOUNTER — Inpatient Hospital Stay: Payer: Medicare Other

## 2021-03-23 DIAGNOSIS — G629 Polyneuropathy, unspecified: Secondary | ICD-10-CM | POA: Insufficient documentation

## 2021-03-23 DIAGNOSIS — M25561 Pain in right knee: Secondary | ICD-10-CM | POA: Diagnosis not present

## 2021-03-23 DIAGNOSIS — K5900 Constipation, unspecified: Secondary | ICD-10-CM | POA: Insufficient documentation

## 2021-03-23 DIAGNOSIS — R197 Diarrhea, unspecified: Secondary | ICD-10-CM | POA: Diagnosis not present

## 2021-03-23 DIAGNOSIS — Z808 Family history of malignant neoplasm of other organs or systems: Secondary | ICD-10-CM | POA: Insufficient documentation

## 2021-03-23 DIAGNOSIS — D500 Iron deficiency anemia secondary to blood loss (chronic): Secondary | ICD-10-CM | POA: Diagnosis not present

## 2021-03-23 DIAGNOSIS — K922 Gastrointestinal hemorrhage, unspecified: Secondary | ICD-10-CM | POA: Diagnosis not present

## 2021-03-23 DIAGNOSIS — Z87891 Personal history of nicotine dependence: Secondary | ICD-10-CM | POA: Diagnosis not present

## 2021-03-23 DIAGNOSIS — M6281 Muscle weakness (generalized): Secondary | ICD-10-CM | POA: Diagnosis not present

## 2021-03-23 DIAGNOSIS — D509 Iron deficiency anemia, unspecified: Secondary | ICD-10-CM | POA: Insufficient documentation

## 2021-03-23 DIAGNOSIS — M2560 Stiffness of unspecified joint, not elsewhere classified: Secondary | ICD-10-CM | POA: Diagnosis not present

## 2021-03-23 DIAGNOSIS — Z8371 Family history of colonic polyps: Secondary | ICD-10-CM | POA: Insufficient documentation

## 2021-03-23 DIAGNOSIS — M25562 Pain in left knee: Secondary | ICD-10-CM | POA: Diagnosis not present

## 2021-03-23 NOTE — Telephone Encounter (Signed)
Scheduled follow-up appointment per 5/12 los. Patient is aware. 

## 2021-03-23 NOTE — Assessment & Plan Note (Signed)
This is a very pleasant 75 year old male patient with past medical history significant for GI bleed referred to hematology for further recommendations regarding his persistent iron deficiency and his intolerance to oral iron.  [name] arrived to the appointment today with his wife.  He complains of ongoing fatigue, nausea, intermittent constipation, diarrhea and chronic neuropathy.  He has been following up with his gastroenterologist who recommended hematology since he was worried about poor tolerance to oral iron.  [name] tells me that he is very reluctant to try new medication, he is very sensitive to many medications. Physical examination, no overt findings today, mild lower extremity swelling no palpable adenopathy or splenomegaly.  It appears that he most likely has iron deficiency anemia because of his GI bleed last year.  He however does not have any evidence of anemia which is very reassuring.  I encouraged him to try oral iron and if he still remains intolerant to it, we can certainly consider intravenous iron.  He is also very reluctant to try intravenous iron because he is very worried about the drug side effects.  He actually bought some liquid iron to try but he does not know the strength of it, he will reach out back to [name] with the strength of it so we can make adequate recommendations for administration.  He wants to come back in 2 to 3 months for repeat labs and if he still does not have any improvement in his ferritin despite compliance with liquid iron, he will certainly consider intravenous iron.  We have discussed that intravenous iron is very well-tolerated in general but may have adverse effects such as flulike symptoms, some electrolyte imbalance and there is a small risk of severe allergy/anaphylactic reaction. Thank you for consulting on the care of this patient.  Please not hesitate to contact us with any additional questions or concerns.

## 2021-03-23 NOTE — Progress Notes (Signed)
[place] NOTE  Patient Care Team: [name], MD as PCP - General (Internal Medicine) [name], MD as Attending Physician (Internal Medicine) Domingo Pulse, MD (Urology) Phylliss Blakes, OD as Consulting Physician (Optometry)  CHIEF COMPLAINTS/PURPOSE OF CONSULTATION:  Iron deficiency and recommendation discussion ASSESSMENT & PLAN:  Iron deficiency anemia due to chronic blood loss This is a very pleasant 75 year old male patient with past medical history significant for GI bleed referred to hematology for further recommendations regarding his persistent iron deficiency and his intolerance to oral iron.  [name] arrived to the appointment today with his wife.  He complains of ongoing fatigue, nausea, intermittent constipation, diarrhea and chronic neuropathy.  He has been following up with his gastroenterologist who recommended hematology since he was worried about poor tolerance to oral iron.  [name] tells me that he is very reluctant to try new medication, he is very sensitive to many medications. Physical examination, no overt findings today, mild lower extremity swelling no palpable adenopathy or splenomegaly.  It appears that he most likely has iron deficiency anemia because of his GI bleed last year.  He however does not have any evidence of anemia which is very reassuring.  I encouraged him to try oral iron and if he still remains intolerant to it, we can certainly consider intravenous iron.  He is also very reluctant to try intravenous iron because he is very worried about the drug side effects.  He actually bought some liquid iron to try but he does not know the strength of it, he will reach out back to [name] with the strength of it so we can make adequate recommendations for administration.  He wants to come back in 2 to 3 months for repeat labs and if he still does not have any improvement in his ferritin despite compliance with liquid iron, he will  certainly consider intravenous iron.  We have discussed that intravenous iron is very well-tolerated in general but may have adverse effects such as flulike symptoms, some electrolyte imbalance and there is a small risk of severe allergy/anaphylactic reaction. Thank you for consulting on the care of this patient.  Please not hesitate to contact us with any additional questions or concerns.  No orders of the defined types were placed in this encounter.    HISTORY OF PRESENTING ILLNESS:  Erik Lolling Sr. 75 y.o. male is here because of Iron deficiency  This is a very pleasant 75 year old male patient with multiple medical comorbidities, GI bleed last year who presents to hematology for further recommendations regarding his iron deficiency.  Patient arrived to the appointment with his wife.  He tells me that he feels fatigued and has some nausea which has been ongoing along with chronic gastrointestinal issues for which he follows up with gastroenterology regularly.  He is very sensitive to medications and has had a lot of trouble with proton pump inhibitors in the past.  He denies any recent blood loss or melena.  He has had blood transfusion in the hospital which has led to some improvement.  He has tried taking oral iron supplement about 3 months ago but did not tolerate it long.  He has worn a liquid iron supplement but has not tried it yet.  He is very worried about taking a new drug.  Besides the chronic fatigue, neuropathy, gastrointestinal problems, he denies any new complaints.  He may have lost about 5 to 6 pounds overall but no overt  weight loss.  Some mild chronic arthritis in his knees.  Rest of the pertinent 10 point ROS reviewed and negative.  REVIEW OF SYSTEMS:   Constitutional: Denies fevers, chills or abnormal night sweats Eyes: Denies blurriness of vision, double vision or watery eyes Ears, nose, mouth, throat, and face: Denies mucositis or sore throat Respiratory: Denies cough,  dyspnea or wheezes Cardiovascular: Denies palpitation, chest discomfort or lower extremity swelling Gastrointestinal:  Denies nausea, heartburn or change in bowel habits Skin: Denies abnormal skin rashes Lymphatics: Denies new lymphadenopathy or easy bruising Neurological:Denies numbness, tingling or new weaknesses Behavioral/Psych: Mood is stable, no new changes  All other systems were reviewed with the patient and are negative.  MEDICAL HISTORY:  Past Medical History:  Diagnosis Date  . Allergic rhinitis   . Allergy   . Anal fissure   . Anemia   . Anxiety   . Blood transfusion without reported diagnosis   . Cataract    starting  . Coronary atherosclerosis of native coronary artery 2007   nonobstructive CAD by cath with 40% mid-LAD stenosis  . Degenerative arthritis of knee, bilateral 10/09/2017  . Depression   . Diverticulosis 12/18/2011  . Diverticulosis of colon with hemorrhage    April 2021, [facility] healthcare  . Fibromyalgia   . GERD with stricture   . Helicobacter pylori (H. pylori) infection 11/25/2012   11/2012 EGD + gastric bxs  . Hiatal hernia   . HLD (hyperlipidemia)   . HTN (hypertension)   . Hyperlipidemia   . IBS (irritable bowel syndrome)   . Impotence of organic origin   . Insomnia   . Internal and external hemorrhoids without complication   . Interstitial cystitis   . Irritable bowel syndrome 06/09/2010   Qualifier: Diagnosis of  By: Carlean Purl MD, Dimas Millin   . Osteoporosis   . Pelvic floor dysfunction 11/29/2017  . Reactive hypoglycemia   . Rheumatoid arthritis(714.0)   . 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 Vitamin D deficiency 11/29/2017    SURGICAL HISTORY: Past Surgical History:  Procedure Laterality Date  . bladder distention     x 6  . CATHETER REMOVAL     super pubic area  . COLONOSCOPY    . DENTAL SURGERY Right    #30 tooth extraction (right upper)  . INTERSTIM IMPLANT PLACEMENT    . INTERSTIM IMPLANT REMOVAL    . KNEE ARTHROSCOPY      right x 2  . NISSEN FUNDOPLICATION  4098  . PROSTECTOMY    . SHOULDER ARTHROSCOPY  2010   left  . TONSILLECTOMY AND ADENOIDECTOMY  1957  . TRANSURETHRAL RESECTION OF PROSTATE     x 2  . UPPER GASTROINTESTINAL ENDOSCOPY  05/13/2008   hiatal hernia  . VASECTOMY      SOCIAL HISTORY: Social History   Socioeconomic History  . Marital status: Married    Spouse name: Not on file  . Number of children: 4  . Years of education: Not on file  . Highest education level: Not on file  Occupational History  . Occupation: retired  Tobacco Use  . Smoking status: Former Research scientist (life sciences)  . Smokeless tobacco: Never Used  . Tobacco comment: quit 1980  Vaping Use  . Vaping Use: Never used  Substance and Sexual Activity  . Alcohol use: No    Alcohol/week: 0.0 standard drinks  . Drug use: No  . Sexual activity: Yes    Partners: Female  Other Topics Concern  . Not on file  Social History  Narrative   Married, retired for children   He is a former smoker no alcohol or substance use   Social Determinants of Radio broadcast assistant Strain: Low Risk   . Difficulty of Paying Living Expenses: Not hard at all  Food Insecurity: No Food Insecurity  . Worried About Charity fundraiser in the Last Year: Never true  . Ran Out of Food in the Last Year: Never true  Transportation Needs: No Transportation Needs  . Lack of Transportation (Medical): No  . Lack of Transportation (Non-Medical): No  Physical Activity: Inactive  . Days of Exercise per Week: 0 days  . Minutes of Exercise per Session: 0 min  Stress: No Stress Concern Present  . Feeling of Stress : Not at all  Social Connections: Not on file  Intimate Partner Violence: Not on file    FAMILY HISTORY: Family History  Problem Relation Age of Onset  . Colon polyps Father   . Diabetes Father   . Heart disease Father   . Hypertension Father   . Heart disease Mother   . Hypertension Mother   . Stroke Mother   . Arthritis Mother   . Diabetes  Sister   . Breast cancer Sister   . Diabetes Paternal Uncle   . Liver cancer Paternal Uncle   . Liver cancer Maternal Grandfather   . Colon cancer Neg Hx   . Esophageal cancer Neg Hx   . Rectal cancer Neg Hx   . Stomach cancer Neg Hx   . Pancreatic cancer Neg Hx     ALLERGIES:  is allergic to ambien [zolpidem], bentyl [dicyclomine], carafate [sucralfate], lansoprazole, lexapro [escitalopram oxalate], pneumovax [pneumococcal polysaccharide vaccine], prilosec [omeprazole], remeron [mirtazapine], seroquel [quetiapine], statins, tramadol, and tylenol [acetaminophen].  MEDICATIONS:  Current Outpatient Medications  Medication Sig Dispense Refill  . clonazePAM (KLONOPIN) 0.5 MG tablet 1 tab by mouth in the AM and 2 in the PM as needed 90 tablet 2  . famotidine (PEPCID) 40 MG tablet Take 1 tablet (40 mg total) by mouth 2 (two) times daily. 180 tablet 3  . hydrOXYzine (ATARAX/VISTARIL) 10 MG tablet Take 1 tablet (10 mg total) by mouth at bedtime. (Patient taking differently: Take 10 mg by mouth as needed.) 90 tablet 3  . metoCLOPramide (REGLAN) 10 MG tablet Take 1 tablet (10 mg total) by mouth at bedtime. (Patient not taking: Reported on 03/16/2021) 30 tablet 1  . tiZANidine (ZANAFLEX) 2 MG tablet Take 1 tablet (2 mg total) by mouth every 6 (six) hours as needed for muscle spasms. 40 tablet 5  . traMADol (ULTRAM) 50 MG tablet tramadol 50 mg tablet  Take 1 tablet every 6 hours by oral route as needed for 5 days.    . traZODone (DESYREL) 50 MG tablet Take 1 tablet (50 mg total) by mouth at bedtime. 90 tablet 1   No current facility-administered medications for this visit.     PHYSICAL EXAMINATION: ECOG PERFORMANCE STATUS: 1 - Symptomatic but completely ambulatory  Vitals:   03/23/21 1048  BP: (!) 148/77  Pulse: 79  Resp: 18  Temp: (!) 97.4 F (36.3 C)  SpO2: 100%   Filed Weights   03/23/21 1048  Weight: 172 lb 9.6 oz (78.3 kg)    GENERAL:alert, no distress and comfortable SKIN:  skin color, texture, turgor are normal, no rashes or significant lesions EYES: normal, conjunctiva are pink and non-injected, sclera clear OROPHARYNX:no exudate, no erythema and lips, buccal mucosa, and tongue normal  NECK: supple,  thyroid normal size, non-tender, without nodularity LYMPH:  no palpable lymphadenopathy in the cervical, axillary or inguinal LUNGS: clear to auscultation and percussion with normal breathing effort HEART: regular rate & rhythm and no murmurs and no lower extremity edema ABDOMEN:abdomen soft, non-tender and normal bowel sounds Musculoskeletal:no cyanosis of digits and no clubbing.  Mild ankle swelling, no major swelling. PSYCH: alert & oriented x 3 with fluent speech NEURO: no focal motor/sensory deficits  LABORATORY DATA:  I have reviewed the data as listed Lab Results  Component Value Date   WBC 7.1 03/13/2021   HGB 14.8 03/13/2021   HCT 44.0 03/13/2021   MCV 88.8 03/13/2021   PLT 303.0 03/13/2021     Chemistry      Component Value Date/Time   NA 137 03/13/2021 1400   K 3.9 03/13/2021 1400   CL 102 03/13/2021 1400   CO2 26 03/13/2021 1400   BUN 25 (H) 03/13/2021 1400   CREATININE 1.36 03/13/2021 1400      Component Value Date/Time   CALCIUM 9.5 03/13/2021 1400   ALKPHOS 68 03/13/2021 1400   AST 17 03/13/2021 1400   ALT 14 03/13/2021 1400   BILITOT 0.4 03/13/2021 1400     I reviewed his labs.  His most recent labs showed no evidence of anemia, hemoglobin of 14.8 g/dL, ferritin of 21 and his rest of the CBC appears quite unremarkable.  RADIOGRAPHIC STUDIES: I have personally reviewed the radiological images as listed and agreed with the findings in the report. No results found.  All questions were answered. The patient knows to call the clinic with any problems, questions or concerns. I spent 45 minutes in the care of this patient including H and P, review of records, counseling and coordination of care. I reviewed his records, discussed  about common cause of iron deficiency anemia, discussed about possible iron supplements as well as intravenous iron, adverse effects of intravenous iron.    Benay Pike, MD 03/23/2021 12:33 PM

## 2021-03-24 ENCOUNTER — Encounter: Payer: Self-pay | Admitting: Hematology and Oncology

## 2021-03-28 DIAGNOSIS — M25562 Pain in left knee: Secondary | ICD-10-CM | POA: Diagnosis not present

## 2021-03-28 DIAGNOSIS — M25561 Pain in right knee: Secondary | ICD-10-CM | POA: Diagnosis not present

## 2021-03-28 DIAGNOSIS — M2560 Stiffness of unspecified joint, not elsewhere classified: Secondary | ICD-10-CM | POA: Diagnosis not present

## 2021-03-28 DIAGNOSIS — M6281 Muscle weakness (generalized): Secondary | ICD-10-CM | POA: Diagnosis not present

## 2021-03-29 DIAGNOSIS — Z932 Ileostomy status: Secondary | ICD-10-CM | POA: Diagnosis not present

## 2021-03-30 DIAGNOSIS — M25562 Pain in left knee: Secondary | ICD-10-CM | POA: Diagnosis not present

## 2021-03-30 DIAGNOSIS — M6281 Muscle weakness (generalized): Secondary | ICD-10-CM | POA: Diagnosis not present

## 2021-03-30 DIAGNOSIS — M25561 Pain in right knee: Secondary | ICD-10-CM | POA: Diagnosis not present

## 2021-03-30 DIAGNOSIS — M2560 Stiffness of unspecified joint, not elsewhere classified: Secondary | ICD-10-CM | POA: Diagnosis not present

## 2021-04-04 DIAGNOSIS — M2560 Stiffness of unspecified joint, not elsewhere classified: Secondary | ICD-10-CM | POA: Diagnosis not present

## 2021-04-04 DIAGNOSIS — M25562 Pain in left knee: Secondary | ICD-10-CM | POA: Diagnosis not present

## 2021-04-04 DIAGNOSIS — M6281 Muscle weakness (generalized): Secondary | ICD-10-CM | POA: Diagnosis not present

## 2021-04-04 DIAGNOSIS — M25561 Pain in right knee: Secondary | ICD-10-CM | POA: Diagnosis not present

## 2021-04-06 DIAGNOSIS — M25561 Pain in right knee: Secondary | ICD-10-CM | POA: Diagnosis not present

## 2021-04-06 DIAGNOSIS — M25562 Pain in left knee: Secondary | ICD-10-CM | POA: Diagnosis not present

## 2021-04-06 DIAGNOSIS — M6281 Muscle weakness (generalized): Secondary | ICD-10-CM | POA: Diagnosis not present

## 2021-04-06 DIAGNOSIS — M2560 Stiffness of unspecified joint, not elsewhere classified: Secondary | ICD-10-CM | POA: Diagnosis not present

## 2021-04-11 DIAGNOSIS — M2560 Stiffness of unspecified joint, not elsewhere classified: Secondary | ICD-10-CM | POA: Diagnosis not present

## 2021-04-11 DIAGNOSIS — M25561 Pain in right knee: Secondary | ICD-10-CM | POA: Diagnosis not present

## 2021-04-11 DIAGNOSIS — M25562 Pain in left knee: Secondary | ICD-10-CM | POA: Diagnosis not present

## 2021-04-11 DIAGNOSIS — M6281 Muscle weakness (generalized): Secondary | ICD-10-CM | POA: Diagnosis not present

## 2021-04-13 DIAGNOSIS — M2560 Stiffness of unspecified joint, not elsewhere classified: Secondary | ICD-10-CM | POA: Diagnosis not present

## 2021-04-13 DIAGNOSIS — M6281 Muscle weakness (generalized): Secondary | ICD-10-CM | POA: Diagnosis not present

## 2021-04-13 DIAGNOSIS — M25562 Pain in left knee: Secondary | ICD-10-CM | POA: Diagnosis not present

## 2021-04-13 DIAGNOSIS — M25561 Pain in right knee: Secondary | ICD-10-CM | POA: Diagnosis not present

## 2021-04-17 DIAGNOSIS — M25562 Pain in left knee: Secondary | ICD-10-CM | POA: Diagnosis not present

## 2021-04-17 DIAGNOSIS — M2560 Stiffness of unspecified joint, not elsewhere classified: Secondary | ICD-10-CM | POA: Diagnosis not present

## 2021-04-17 DIAGNOSIS — M6281 Muscle weakness (generalized): Secondary | ICD-10-CM | POA: Diagnosis not present

## 2021-04-17 DIAGNOSIS — M25561 Pain in right knee: Secondary | ICD-10-CM | POA: Diagnosis not present

## 2021-04-18 ENCOUNTER — Telehealth: Payer: Self-pay | Admitting: Internal Medicine

## 2021-04-18 NOTE — Telephone Encounter (Signed)
I like the otc salonpaz lidocaine patch

## 2021-04-18 NOTE — Telephone Encounter (Signed)
Seeking advice  Patient seeking advice for OTC medication for low back pain Declined appointment

## 2021-04-18 NOTE — Telephone Encounter (Signed)
Patient states that he is also having bowel cramps. Patient scheduled for 04/21/21 with Dr. Jenny Reichmann

## 2021-04-21 ENCOUNTER — Encounter: Payer: Self-pay | Admitting: Internal Medicine

## 2021-04-21 ENCOUNTER — Telehealth (INDEPENDENT_AMBULATORY_CARE_PROVIDER_SITE_OTHER): Payer: Medicare Other | Admitting: Internal Medicine

## 2021-04-21 ENCOUNTER — Other Ambulatory Visit (INDEPENDENT_AMBULATORY_CARE_PROVIDER_SITE_OTHER): Payer: Medicare Other

## 2021-04-21 ENCOUNTER — Telehealth: Payer: Self-pay | Admitting: Internal Medicine

## 2021-04-21 DIAGNOSIS — R1084 Generalized abdominal pain: Secondary | ICD-10-CM

## 2021-04-21 DIAGNOSIS — D500 Iron deficiency anemia secondary to blood loss (chronic): Secondary | ICD-10-CM | POA: Diagnosis not present

## 2021-04-21 DIAGNOSIS — F419 Anxiety disorder, unspecified: Secondary | ICD-10-CM

## 2021-04-21 LAB — CBC WITH DIFFERENTIAL/PLATELET
Basophils Absolute: 0.1 10*3/uL (ref 0.0–0.1)
Basophils Relative: 0.7 % (ref 0.0–3.0)
Eosinophils Absolute: 0 10*3/uL (ref 0.0–0.7)
Eosinophils Relative: 0.1 % (ref 0.0–5.0)
HCT: 44.2 % (ref 39.0–52.0)
Hemoglobin: 14.9 g/dL (ref 13.0–17.0)
Lymphocytes Relative: 17 % (ref 12.0–46.0)
Lymphs Abs: 1.8 10*3/uL (ref 0.7–4.0)
MCHC: 33.8 g/dL (ref 30.0–36.0)
MCV: 88.8 fl (ref 78.0–100.0)
Monocytes Absolute: 1.2 10*3/uL — ABNORMAL HIGH (ref 0.1–1.0)
Monocytes Relative: 11.6 % (ref 3.0–12.0)
Neutro Abs: 7.5 10*3/uL (ref 1.4–7.7)
Neutrophils Relative %: 70.6 % (ref 43.0–77.0)
Platelets: 271 10*3/uL (ref 150.0–400.0)
RBC: 4.97 Mil/uL (ref 4.22–5.81)
RDW: 13.5 % (ref 11.5–15.5)
WBC: 10.6 10*3/uL — ABNORMAL HIGH (ref 4.0–10.5)

## 2021-04-21 LAB — URINALYSIS, ROUTINE W REFLEX MICROSCOPIC
Bilirubin Urine: NEGATIVE
Ketones, ur: NEGATIVE
Nitrite: NEGATIVE
Specific Gravity, Urine: 1.01 (ref 1.000–1.030)
Total Protein, Urine: NEGATIVE
Urine Glucose: NEGATIVE
Urobilinogen, UA: 0.2 (ref 0.0–1.0)
pH: 7.5 (ref 5.0–8.0)

## 2021-04-21 LAB — BASIC METABOLIC PANEL
BUN: 18 mg/dL (ref 6–23)
CO2: 24 mEq/L (ref 19–32)
Calcium: 9.4 mg/dL (ref 8.4–10.5)
Chloride: 98 mEq/L (ref 96–112)
Creatinine, Ser: 1.35 mg/dL (ref 0.40–1.50)
GFR: 51.87 mL/min — ABNORMAL LOW (ref >60.00)
Glucose, Bld: 129 mg/dL — ABNORMAL HIGH (ref 70–99)
Potassium: 3.7 mEq/L (ref 3.5–5.1)
Sodium: 131 mEq/L — ABNORMAL LOW (ref 135–145)

## 2021-04-21 LAB — HEPATIC FUNCTION PANEL
ALT: 10 U/L (ref 0–53)
AST: 12 U/L (ref 0–37)
Albumin: 4.3 g/dL (ref 3.5–5.2)
Alkaline Phosphatase: 65 U/L (ref 39–117)
Bilirubin, Direct: 0.1 mg/dL (ref 0.0–0.3)
Total Bilirubin: 0.7 mg/dL (ref 0.2–1.2)
Total Protein: 7.8 g/dL (ref 6.0–8.3)

## 2021-04-21 LAB — LIPASE: Lipase: 24 U/L (ref 11.0–59.0)

## 2021-04-21 MED ORDER — HYDROCODONE-ACETAMINOPHEN 5-325 MG PO TABS: 1.0000 | ORAL_TABLET | Freq: Four times a day (QID) | ORAL | 0 refills | Status: DC | PRN

## 2021-04-21 MED ORDER — LEVOFLOXACIN 500 MG PO TABS: 500.0000 mg | ORAL_TABLET | Freq: Every day | ORAL | 0 refills | Status: AC

## 2021-04-21 NOTE — Telephone Encounter (Signed)
Patient calling to report nausea, vomiting, diarrhea, low back pain.He has concerns about possibly having UTI   Patient is requesting stat lab orders. Explained to patient an order from Dr Jenny Reichmann would be needed to determine what type of test appropriate. Patient was advised several times that he needs the appointment first.   He has declined going to UC/ED Negative home COVID test  6/10 Appointment changed to virtual.

## 2021-04-21 NOTE — Progress Notes (Signed)
Patient ID: Erik Lolling Sr., male   DOB: 1946/07/09, 75 y.o.   MRN: [id number]  Virtual Visit via Video Note  I connected with Erik Lolling Sr. on 04/21/21 at  1:20 PM EDT by a video enabled telemedicine application and verified that I am speaking with the correct person using two identifiers.  Location of all participants today Patient;  at home with wife Provider: at office   I discussed the limitations of evaluation and management by telemedicine and the availability of in person appointments. The patient expressed understanding and agreed to proceed.  History of Present Illness: Here with c/o back pain c/w "from the kidneys to the intestines" x 3 days, mild to now mod, feels ill and feverish with chills, fatigue, nausea, diffuse abd pain, diffuse arthralgias just hurts all over, temp up to 101 and diarrhea watery and loose with vomiting or blood.  Hard to be more specific, no ST, cough,  and Pt denies chest pain, increased sob or doe, wheezing, orthopnea, PND, increased LE swelling, palpitations, dizziness or syncope.  Denies worsening reflux, other abd pain, dysphagia.  Tested home covid neg yesterday.  Denies other urinary symptoms such as dysuria, frequency, urgency,  hematuria  Denies worsening depressive symptoms, suicidal ideation, or panic; has ongoing anxiety Past Medical History:  Diagnosis Date   Allergic rhinitis    Allergy    Anal fissure    Anemia    Anxiety    Blood transfusion without reported diagnosis    Cataract    starting   Coronary atherosclerosis of native coronary artery 2007   nonobstructive CAD by cath with 40% mid-LAD stenosis   Degenerative arthritis of knee, bilateral 10/09/2017   Depression    Diverticulosis 12/18/2011   Diverticulosis of colon with hemorrhage    April 2021, [facility] healthcare   Fibromyalgia    GERD with stricture    Helicobacter pylori (H. 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   Vitamin D deficiency 11/29/2017   Past Surgical History:  Procedure Laterality Date   bladder distention     x 6   CATHETER REMOVAL     super pubic area   COLONOSCOPY     DENTAL SURGERY Right    #30 tooth extraction (right upper)   INTERSTIM IMPLANT PLACEMENT     INTERSTIM IMPLANT REMOVAL     KNEE ARTHROSCOPY     right x 2   NISSEN FUNDOPLICATION  3903   PROSTECTOMY     SHOULDER ARTHROSCOPY  2010   left   TONSILLECTOMY AND ADENOIDECTOMY  1957   TRANSURETHRAL RESECTION OF PROSTATE     x 2   UPPER GASTROINTESTINAL ENDOSCOPY  05/13/2008   hiatal hernia   VASECTOMY      reports that he has quit smoking. He has never used smokeless tobacco. He reports that he does not drink alcohol and does not use drugs. family history includes Arthritis in  his mother; Breast cancer in his sister; Colon polyps in his father; Diabetes in his father, paternal uncle, and sister; Heart disease in his father and mother; Hypertension in his father and mother; Liver cancer in his maternal grandfather and paternal uncle; Stroke in his mother. Allergies  Allergen Reactions   Ambien [Zolpidem]    Bentyl [Dicyclomine]     Memory loss, anxiety, blurred vision   Carafate [Sucralfate] Nausea And Vomiting   Lansoprazole Diarrhea   Lexapro [Escitalopram Oxalate]    Pneumovax [Pneumococcal Polysaccharide Vaccine] Other (See Comments)    pain   Prilosec [Omeprazole] Other (See Comments)    Per patient joint pain with PPI's   Remeron [Mirtazapine]    Seroquel [Quetiapine]  Other (See Comments)    "terrible reaction"   Statins Other (See Comments)    Joint pain   Tramadol Other (See Comments)   Tylenol [Acetaminophen]    Current Outpatient Medications on File Prior to Visit  Medication Sig Dispense Refill   clonazePAM (KLONOPIN) 0.5 MG tablet 1 tab by mouth in the AM and 2 in the PM as needed 90 tablet 2   famotidine (PEPCID) 40 MG tablet Take 1 tablet (40 mg total) by mouth 2 (two) times daily. 180 tablet 3   hydrOXYzine (ATARAX/VISTARIL) 10 MG tablet Take 1 tablet (10 mg total) by mouth at bedtime. (Patient taking differently: Take 10 mg by mouth as needed.) 90 tablet 3   metoCLOPramide (REGLAN) 10 MG tablet Take 1 tablet (10 mg total) by mouth at bedtime. (Patient not taking: Reported on 03/16/2021) 30 tablet 1   tiZANidine (ZANAFLEX) 2 MG tablet Take 1 tablet (2 mg total) by mouth every 6 (six) hours as needed for muscle spasms. 40 tablet 5   traMADol (ULTRAM) 50 MG tablet tramadol 50 mg tablet  Take 1 tablet every 6 hours by oral route as needed for 5 days.     traZODone (DESYREL) 50 MG tablet Take 1 tablet (50 mg total) by mouth at bedtime. 90 tablet 1   No current facility-administered medications on file prior to visit.    Observations/Objective: Alert, NAD, appropriate mood and affect, resps normal, cn 2-12 intact, moves all 4s, no visible rash or swelling Lab Results  Component Value Date   WBC 10.6 (H) 04/21/2021   HGB 14.9 04/21/2021   HCT 44.2 04/21/2021   PLT 271.0 04/21/2021   GLUCOSE 129 (H) 04/21/2021   CHOL 163 06/30/2019   TRIG 132.0 06/30/2019   HDL 37.20 (L) 06/30/2019   LDLDIRECT 169.0 06/24/2018   LDLCALC 100 (H) 06/30/2019   ALT 10 04/21/2021   AST 12 04/21/2021   NA 131 (L) 04/21/2021   K 3.7 04/21/2021   CL 98 04/21/2021   CREATININE 1.35 04/21/2021   BUN 18 04/21/2021   CO2 24 04/21/2021   TSH 2.12 03/13/2021   PSA 0.00 (L) 06/30/2019   HGBA1C 5.9 06/30/2019   Assessment and Plan: See notes  Follow Up  Instructions: See notes   I discussed the assessment and treatment plan with the patient. The patient was provided an opportunity to ask questions and all were answered. The patient agreed with the plan and demonstrated an understanding of the instructions.   The patient was advised to call back or seek an in-person evaluation if the symptoms worsen or if the condition fails to improve as anticipated.   Cathlean Cower, MD

## 2021-04-22 ENCOUNTER — Encounter: Payer: Self-pay | Admitting: Internal Medicine

## 2021-04-22 DIAGNOSIS — R1084 Generalized abdominal pain: Secondary | ICD-10-CM | POA: Insufficient documentation

## 2021-04-22 NOTE — Assessment & Plan Note (Signed)
D/w pt, declines need for change in tx at this time, has mult med intoelerances

## 2021-04-22 NOTE — Assessment & Plan Note (Signed)
Without overt bleeding, but with cbc with labs,  to f/u any worsening symptoms or concerns

## 2021-04-22 NOTE — Patient Instructions (Signed)
Please take all new medication as prescribed  Please go to the LAB at the blood drawing area for the tests to be done

## 2021-04-22 NOTE — Assessment & Plan Note (Addendum)
Pt is somewhat difficult historian but appears acutely ill likely infectious, most likely GI I suspect such as colitis; pt declines asking him to go to ED an GI panel if not covered by insurance,; now for urine and blood culture, cbc cmet with labs as ordered; empiric levaquin asd,  to f/u any worsening symptoms or concerns

## 2021-04-25 ENCOUNTER — Other Ambulatory Visit: Payer: Medicare Other

## 2021-04-25 ENCOUNTER — Encounter: Payer: Self-pay | Admitting: Internal Medicine

## 2021-04-25 ENCOUNTER — Other Ambulatory Visit: Payer: Self-pay

## 2021-04-25 ENCOUNTER — Ambulatory Visit: Payer: Medicare Other | Admitting: Internal Medicine

## 2021-04-25 VITALS — BP 125/75 | HR 73 | Ht 66.0 in | Wt 179.4 lb

## 2021-04-25 DIAGNOSIS — K921 Melena: Secondary | ICD-10-CM

## 2021-04-25 DIAGNOSIS — M170 Bilateral primary osteoarthritis of knee: Secondary | ICD-10-CM

## 2021-04-25 DIAGNOSIS — M797 Fibromyalgia: Secondary | ICD-10-CM

## 2021-04-25 DIAGNOSIS — D649 Anemia, unspecified: Secondary | ICD-10-CM

## 2021-04-25 NOTE — Progress Notes (Signed)
Office Visit Note  Patient: Erik COMP Sr.             Date of Birth: 1946/10/11           MRN: [id number]             PCP: [name], MD Referring: [name], MD Visit Date: 04/25/2021   Subjective:  Follow-up (Patient complains of continued neck and bilateral shoulder pain radiating into bilateral upper extremities, right knee pain. //Patient had an episode of chills, fatigue, nausea, fever, joint pain and body aches X 1 week, improving within the last day. )   History of Present Illness: Erik CONERY Sr. is a 75 y.o. male here for follow up for chronic joint pains, osteoarthritis, and fibromyalgia syndrome involving fatigue, IBS, cystitis, headaches, and cognitive difficulty.  Symptoms previously seen with Dr. Pasty Spillers low for about 15 years for chronic joint pain syndromes.  He was referred to see physical therapy due to knee arthritis symptoms he found working with them twice per week has improved his mobility and level of activity though the knee pain is equal or more persistent with a sharp painful sensation especially resting at night.  Since last visit he is continued having bilateral shoulder and neck pain that has been at a fairly stable amount though his overall symptoms increased notably last week.  At that time he was found to have esophagitis also had increased urinary and IBS symptoms.  He is currently on treatment with antibiotic though apparently urine culture was negative.  He is about 5 days through the antibiotics course with then oral fluoroquinolone and does feel his symptoms improving dramatically with this.    Review of Systems  Constitutional:  Positive for fatigue.  HENT:  Positive for mouth dryness and nose dryness. Negative for mouth sores.   Eyes:  Positive for pain, itching, visual disturbance and dryness.  Respiratory:  Positive for cough. Negative for hemoptysis, shortness of breath and difficulty breathing.   Cardiovascular:  Positive for  swelling in legs/feet. Negative for chest pain and palpitations.  Gastrointestinal:  Positive for abdominal pain. Negative for blood in stool, constipation and diarrhea.  Endocrine: Negative for increased urination.  Genitourinary:  Negative for painful urination.  Musculoskeletal:  Positive for joint pain, joint pain, joint swelling, myalgias, muscle weakness, morning stiffness, muscle tenderness and myalgias.  Skin:  Negative for color change, rash and redness.  Allergic/Immunologic: Negative for susceptible to infections.  Neurological:  Positive for dizziness, numbness, headaches, memory loss and weakness.  Hematological:  Positive for swollen glands.  Psychiatric/Behavioral:  Positive for confusion and sleep disturbance.    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   History of total cystectomy 06/11/2019   Cervical radiculopathy 04/11/2018   Bilateral leg pain 02/25/2018   Pain in joint of left shoulder 12/10/2017   Pelvic floor dysfunction 11/29/2017   Vitamin D deficiency 11/29/2017   CAD (coronary artery disease) 10/29/2017  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   Elevated PSA 10/09/2017   Reactive hypoglycemia 10/09/2017   Degenerative arthritis of knee, bilateral 10/09/2017   Upper respiratory tract infection 08/17/2017   Family history of osteoporosis 04/02/2017   Senile purpura ([place])  10/14/2016   Medication intolerances 06/26/2016   Dyspepsia 06/26/2016   BPH (benign prostatic hypertrophy) with urinary obstruction 03/30/2016   Abnormal CT of the abdomen 11/10/2015   Right leg pain 09/01/2015   Left shoulder pain 09/01/2015   Fatigue 06/28/2015   Bladder pain 03/25/2015   Polyarthralgia 02/02/2015   Abdominal pain, epigastric 02/01/2015   Bloating 02/01/2015   Bradycardia with 31 - 40 beats per minute 06/18/2014   Chest pain 03/16/2013   Hyperlipidemia 02/77/4128   Helicobacter pylori (H. pylori) infection 11/25/2012   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   Encounter for well adult exam with abnormal findings 02/23/2012   Allergic rhinitis    Insomnia    Diverticulitis    GERD with stricture    IBS (irritable bowel syndrome)    Helicobacter pylori gastritis    Internal and external hemorrhoids without complication    Depression 10/15/2011   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   Irritable bowel syndrome 06/09/2010   Interstitial cystitis 06/09/2010   Fibromyalgia 06/09/2010   FLATULENCE-GAS-BLOATING 06/09/2010    Past Medical History:  Diagnosis Date   Allergic rhinitis    Allergy    Anal fissure    Anemia    Anxiety    Blood transfusion without reported diagnosis    Cataract    starting   Coronary atherosclerosis of native coronary artery 2007   nonobstructive CAD by cath with 40% mid-LAD stenosis   Degenerative arthritis of knee, bilateral 10/09/2017   Depression    Diverticulosis 12/18/2011   Diverticulosis of colon with hemorrhage    April 2021, [facility] healthcare   Fibromyalgia    GERD with stricture    Helicobacter pylori (H. 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   Vitamin D deficiency 11/29/2017    Family History  Problem Relation Age of Onset   Colon polyps Father    Diabetes Father    Heart disease Father    Hypertension Father    Heart disease Mother    Hypertension Mother    Stroke Mother    Arthritis Mother    Diabetes Sister    Breast cancer Sister    Diabetes Paternal Uncle    Liver cancer Paternal Uncle    Liver cancer Maternal Grandfather    Colon cancer Neg Hx    Esophageal cancer Neg Hx    Rectal cancer Neg Hx    Stomach cancer Neg Hx    Pancreatic cancer Neg Hx    Past Surgical History:  Procedure Laterality Date   bladder distention     x 6   CATHETER REMOVAL     super pubic area   COLONOSCOPY  DENTAL SURGERY Right    #30 tooth extraction (right upper)   INTERSTIM IMPLANT PLACEMENT     INTERSTIM IMPLANT REMOVAL     KNEE ARTHROSCOPY     right x 2   NISSEN FUNDOPLICATION  1607   PROSTECTOMY     SHOULDER ARTHROSCOPY  2010   left   TONSILLECTOMY AND ADENOIDECTOMY  1957   TRANSURETHRAL RESECTION OF PROSTATE     x 2   UPPER GASTROINTESTINAL ENDOSCOPY  05/13/2008   hiatal hernia   VASECTOMY     Social History   Social History Narrative   Married, retired for children   He is a former smoker no alcohol or substance use   Immunization History  Administered Date(s) Administered   [employer] Vaccination 01/11/2020, 02/08/2020   Pneumococcal Polysaccharide-23 07/03/2013   Pneumococcal-Unspecified 11/12/2012   Tdap 08/23/2014     Objective: Vital Signs: BP 125/75 (BP Location: Left Arm, Patient Position: Sitting, Cuff Size: Normal)   Pulse 73   Ht 5\' 6"  (1.676 m)   Wt 179 lb 6.4 oz (81.4 kg)   BMI 28.96 kg/m    Physical Exam Skin:    General: Skin is warm  and dry.     Findings: No rash.  Neurological:     General: No focal deficit present.     Mental Status: He is alert.       Musculoskeletal Exam:  Shoulders full ROM, proximal muscle tenderness bilaterally Elbows full ROM no tenderness or swelling Wrists full ROM no tenderness or swelling Fingers full ROM no tenderness or swelling Knees slightly reduced flexion ROM but near normal, patellofemoral crepitus present, joint line tenderness and tight on lateral side and proximally Ankles full ROM no tenderness or swelling  Investigation: No additional findings.  Imaging: No results found.  Recent Labs: Lab Results  Component Value Date   WBC 10.6 (H) 04/21/2021   HGB 14.9 04/21/2021   PLT 271.0 04/21/2021   NA 131 (L) 04/21/2021   K 3.7 04/21/2021   CL 98 04/21/2021   CO2 24 04/21/2021   GLUCOSE 129 (H) 04/21/2021   BUN 18 04/21/2021   CREATININE 1.35 04/21/2021   BILITOT 0.7 04/21/2021   ALKPHOS 65 04/21/2021   AST 12 04/21/2021   ALT 10 04/21/2021   PROT 7.8 04/21/2021   ALBUMIN 4.3 04/21/2021   CALCIUM 9.4 04/21/2021   GFRAA 57 (L) 03/26/2017    Speciality Comments: No specialty comments available.  Procedures:  No procedures performed Allergies: Ambien [zolpidem], Bentyl [dicyclomine], Carafate [sucralfate], Lansoprazole, Lexapro [escitalopram oxalate], Pneumovax [pneumococcal polysaccharide vaccine], Prilosec [omeprazole], Remeron [mirtazapine], Seroquel [quetiapine], Statins, Tramadol, and Tylenol [acetaminophen]   Assessment / Plan:     Visit Diagnoses: Fibromyalgia  Discussed management of fibromyalgia syndrome symptoms he feels his digestive issues are the most prominent feature recently.  Currently these are doing better while taking the antibiotics and taking antacid medicines for the gastritis.  Discussed there is not a specific optimize diet though certain anti-inflammatory diet features such as lower glycemic contents may be of some benefit.  He is also  following up with gastroenterology for this.  He has not yet tried taking any of the tizanidine to say whether it is helpful or not.  No additional medication start recommend at this time.  Osteoarthritis of both knees, unspecified osteoarthritis type  Activity and mobility is reportedly improved with physical therapy.  I discussed he could consider whether decreasing session frequency or transitioning entirely to home exercise program would be good  options since he does feel that these cause some subsequent rest pain.  Gastrointestinal hemorrhage with melena  Esophagitis gastritis and gastrointestinal symptoms seem reasonably controlled.  Unfortunately he is a poor candidate for oral NSAIDs for his chronic arthritis symptoms due to this problem.  Orders: No orders of the defined types were placed in this encounter.  No orders of the defined types were placed in this encounter.    Follow-Up Instructions: Return in about 6 months (around 10/25/2021).   Collier Salina, MD  Note - This record has been created using [employer].  Chart creation errors have been sought, but may not always  have been located. Such creation errors do not reflect on  the standard of medical care.

## 2021-04-27 LAB — CULTURE, BLOOD (SINGLE)
MICRO NUMBER:: 11993873
MICRO NUMBER:: 11993876
Result:: NO GROWTH
Result:: NO GROWTH
SPECIMEN QUALITY:: ADEQUATE
SPECIMEN QUALITY:: ADEQUATE

## 2021-04-27 LAB — URINE CULTURE
MICRO NUMBER:: 11993874
SPECIMEN QUALITY:: ADEQUATE

## 2021-05-01 DIAGNOSIS — Z932 Ileostomy status: Secondary | ICD-10-CM | POA: Diagnosis not present

## 2021-05-08 DIAGNOSIS — R3911 Hesitancy of micturition: Secondary | ICD-10-CM | POA: Diagnosis not present

## 2021-05-08 DIAGNOSIS — N3010 Interstitial cystitis (chronic) without hematuria: Secondary | ICD-10-CM | POA: Diagnosis not present

## 2021-05-08 DIAGNOSIS — Z906 Acquired absence of other parts of urinary tract: Secondary | ICD-10-CM | POA: Diagnosis not present

## 2021-05-08 DIAGNOSIS — K580 Irritable bowel syndrome with diarrhea: Secondary | ICD-10-CM | POA: Diagnosis not present

## 2021-05-08 DIAGNOSIS — R3915 Urgency of urination: Secondary | ICD-10-CM | POA: Diagnosis not present

## 2021-05-08 DIAGNOSIS — R350 Frequency of micturition: Secondary | ICD-10-CM | POA: Diagnosis not present

## 2021-05-08 DIAGNOSIS — R338 Other retention of urine: Secondary | ICD-10-CM | POA: Diagnosis not present

## 2021-05-08 DIAGNOSIS — R3912 Poor urinary stream: Secondary | ICD-10-CM | POA: Diagnosis not present

## 2021-05-09 DIAGNOSIS — Z906 Acquired absence of other parts of urinary tract: Secondary | ICD-10-CM | POA: Diagnosis not present

## 2021-05-09 DIAGNOSIS — M6289 Other specified disorders of muscle: Secondary | ICD-10-CM | POA: Diagnosis not present

## 2021-05-18 ENCOUNTER — Other Ambulatory Visit: Payer: Self-pay

## 2021-05-18 ENCOUNTER — Encounter: Payer: Self-pay | Admitting: Internal Medicine

## 2021-05-18 ENCOUNTER — Ambulatory Visit (INDEPENDENT_AMBULATORY_CARE_PROVIDER_SITE_OTHER): Payer: Medicare Other | Admitting: Internal Medicine

## 2021-05-18 VITALS — BP 130/72 | HR 74 | Ht 66.0 in | Wt 169.0 lb

## 2021-05-18 DIAGNOSIS — M797 Fibromyalgia: Secondary | ICD-10-CM | POA: Diagnosis not present

## 2021-05-18 DIAGNOSIS — E559 Vitamin D deficiency, unspecified: Secondary | ICD-10-CM

## 2021-05-18 DIAGNOSIS — F419 Anxiety disorder, unspecified: Secondary | ICD-10-CM | POA: Diagnosis not present

## 2021-05-18 NOTE — Assessment & Plan Note (Signed)
D/w pt, ok for tizanidine prn use,  to f/u any worsening symptoms or concerns

## 2021-05-18 NOTE — Assessment & Plan Note (Signed)
Overall stable, cont current med tx - klonopin prn

## 2021-05-18 NOTE — Progress Notes (Signed)
Patient ID: Erik Lolling Sr., male   DOB: 04-Apr-1946, 75 y.o.   MRN: [id number]        Chief Complaint: follow up pain       HPI:  Erik DRUDGE Sr. is a 75 y.o. male here with c/o diffuse leg and arm cramps with generalized torso pain, has been better with tizanidine but has been reluctant to take though did not have specific side effect, Pt denies chest pain, increased sob or doe, wheezing, orthopnea, PND, increased LE swelling, palpitations, dizziness or syncope.   Pt denies polydipsia, polyuria, or new focal neuro s/s.   Pt denies fever, night sweats, loss of appetite, or other constitutional symptoms , though has lost wt recently everal lbs. Denies worsening depressive symptoms, suicidal ideation, or panic  Takes Vit D intermittently       Wt Readings from Last 3 Encounters:  05/18/21 169 lb (76.7 kg)  04/25/21 179 lb 6.4 oz (81.4 kg)  03/23/21 172 lb 9.6 oz (78.3 kg)   BP Readings from Last 3 Encounters:  05/18/21 130/72  04/25/21 125/75  03/23/21 (!) 148/77         Past Medical History:  Diagnosis Date   Allergic rhinitis    Allergy    Anal fissure    Anemia    Anxiety    Blood transfusion without reported diagnosis    Cataract    starting   Coronary atherosclerosis of native coronary artery 2007   nonobstructive CAD by cath with 40% mid-LAD stenosis   Degenerative arthritis of knee, bilateral 10/09/2017   Depression    Diverticulosis 12/18/2011   Diverticulosis of colon with hemorrhage    April 2021, [facility]   Fibromyalgia    GERD with stricture    Helicobacter pylori (H. 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   Vitamin D deficiency 11/29/2017   Past Surgical History:  Procedure Laterality Date   bladder distention     x 6   CATHETER REMOVAL     super pubic area   COLONOSCOPY     DENTAL SURGERY Right    #30 tooth extraction (right upper)   INTERSTIM IMPLANT PLACEMENT     INTERSTIM IMPLANT REMOVAL     KNEE ARTHROSCOPY     right x 2   NISSEN FUNDOPLICATION  5681   PROSTECTOMY     SHOULDER ARTHROSCOPY  2010   left   TONSILLECTOMY AND ADENOIDECTOMY  1957   TRANSURETHRAL RESECTION OF PROSTATE     x 2   UPPER GASTROINTESTINAL ENDOSCOPY  05/13/2008   hiatal hernia   VASECTOMY      reports that he has quit smoking. He has never used smokeless tobacco. He reports that he does not drink alcohol and does not use drugs. family history includes Arthritis in his mother; Breast cancer in his sister; Colon polyps in his father; Diabetes in his father, paternal uncle, and sister;  Heart disease in his father and mother; Hypertension in his father and mother; Liver cancer in his maternal grandfather and paternal uncle; Stroke in his mother. Allergies  Allergen Reactions   Ambien [Zolpidem]    Bentyl [Dicyclomine]     Memory loss, anxiety, blurred vision   Carafate [Sucralfate] Nausea And Vomiting   Lansoprazole Diarrhea   Lexapro [Escitalopram Oxalate]    Pneumovax [Pneumococcal Polysaccharide Vaccine] Other (See Comments)    pain   Prilosec [Omeprazole] Other (See Comments)    Per patient joint pain with PPI's   Remeron [Mirtazapine]    Seroquel [Quetiapine] Other (See Comments)    "terrible reaction"   Statins Other (See Comments)    Joint pain   Tramadol Other (See Comments)   Tylenol [Acetaminophen]    Current Outpatient Medications on File Prior to Visit  Medication Sig Dispense Refill   clonazePAM (KLONOPIN) 0.5 MG tablet 1 tab by mouth in  the AM and 2 in the PM as needed 90 tablet 2   famotidine (PEPCID) 40 MG tablet Take 1 tablet (40 mg total) by mouth 2 (two) times daily. 180 tablet 3   tiZANidine (ZANAFLEX) 2 MG tablet Take 1 tablet (2 mg total) by mouth every 6 (six) hours as needed for muscle spasms. 40 tablet 5   traMADol (ULTRAM) 50 MG tablet tramadol 50 mg tablet  Take 1 tablet every 6 hours by oral route as needed for 5 days.     traZODone (DESYREL) 50 MG tablet Take 1 tablet (50 mg total) by mouth at bedtime. 90 tablet 1   HYDROcodone-acetaminophen (NORCO/VICODIN) 5-325 MG tablet Take 1 tablet by mouth every 6 (six) hours as needed. (Patient not taking: Reported on 05/18/2021) 30 tablet 0   hydrOXYzine (ATARAX/VISTARIL) 10 MG tablet Take 1 tablet (10 mg total) by mouth at bedtime. (Patient not taking: Reported on 05/18/2021) 90 tablet 3   metoCLOPramide (REGLAN) 10 MG tablet Take 1 tablet (10 mg total) by mouth at bedtime. (Patient not taking: No sig reported) 30 tablet 1   No current facility-administered medications on file prior to visit.        ROS:  All others reviewed and negative.  Objective        PE:  BP 130/72 (BP Location: Right Arm, Patient Position: Sitting, Cuff Size: Normal)   Pulse 74   Ht 5\' 6"  (1.676 m)   Wt 169 lb (76.7 kg)   SpO2 97%   BMI 27.28 kg/m                 Constitutional: Pt appears in NAD               HENT: Head: NCAT.                Right Ear: External ear normal.                 Left Ear: External ear normal.                Eyes: . Pupils are equal, round, and reactive to light. Conjunctivae and EOM are normal               Nose: without d/c or deformity               Neck: Neck supple. Gross normal ROM               Cardiovascular: Normal rate and regular rhythm.  Pulmonary/Chest: Effort normal and breath sounds without rales or wheezing.                Abd:  Soft, NT, ND, + BS, no organomegaly               Neurological: Pt is alert. At baseline orientation,  motor grossly intact               Skin: Skin is warm. No rashes, no other new lesions, LE edema - none               Psychiatric: Pt behavior is normal without agitation , nervous  Micro: none  Cardiac tracings I have personally interpreted today:  none  Pertinent Radiological findings (summarize): none   Lab Results  Component Value Date   WBC 10.6 (H) 04/21/2021   HGB 14.9 04/21/2021   HCT 44.2 04/21/2021   PLT 271.0 04/21/2021   GLUCOSE 129 (H) 04/21/2021   CHOL 163 06/30/2019   TRIG 132.0 06/30/2019   HDL 37.20 (L) 06/30/2019   LDLDIRECT 169.0 06/24/2018   LDLCALC 100 (H) 06/30/2019   ALT 10 04/21/2021   AST 12 04/21/2021   NA 131 (L) 04/21/2021   K 3.7 04/21/2021   CL 98 04/21/2021   CREATININE 1.35 04/21/2021   BUN 18 04/21/2021   CO2 24 04/21/2021   TSH 2.12 03/13/2021   PSA 0.00 (L) 06/30/2019   HGBA1C 5.9 06/30/2019   Assessment/Plan:  Erik Lolling Sr. is a 75 y.o. White or Caucasian [1] male with  has a past medical history of Allergic rhinitis, Allergy, Anal fissure, Anemia, Anxiety, Blood transfusion without reported diagnosis, Cataract, Coronary atherosclerosis of native coronary artery (2007), Degenerative arthritis of knee, bilateral (10/09/2017), Depression, Diverticulosis (12/18/2011), Diverticulosis of colon with hemorrhage, Fibromyalgia, GERD with stricture, Helicobacter pylori (H. pylori) infection (11/25/2012), Hiatal hernia, HLD (hyperlipidemia), HTN (hypertension), Hyperlipidemia, IBS (irritable bowel syndrome), Impotence of organic origin, Insomnia, Internal and external hemorrhoids without complication, Interstitial cystitis, Irritable bowel syndrome (06/09/2010), Osteoporosis, Pelvic floor dysfunction (11/29/2017), Reactive hypoglycemia, Rheumatoid arthritis(714.0), Somatization disorder (02/27/2012), and Vitamin D deficiency (11/29/2017).  Fibromyalgia D/w pt, ok for tizanidine prn use,  to f/u any worsening symptoms or concerns  Anxiety Overall stable,  cont current med tx - klonopin prn   Vitamin D deficiency Last vitamin D Lab Results  Component Value Date   VD25OH 36.16 11/10/2020   Stable, cont oral replacement  Followup: Return if symptoms worsen or fail to improve.  Cathlean Cower, MD 05/18/2021 10:47 PM [facility]

## 2021-05-18 NOTE — Assessment & Plan Note (Signed)
Last vitamin D Lab Results  Component Value Date   VD25OH 36.16 11/10/2020   Stable, cont oral replacement

## 2021-05-18 NOTE — Patient Instructions (Addendum)
Please continue all other medications as before, including the muscle relaxer as needed  Please have the pharmacy call with any other refills you may need.  Please continue your efforts at being more active, low cholesterol diet, and weight control.  Please keep your appointments with your specialists as you may have planned  Please make an Appointment to return in 4 months, or sooner if needed

## 2021-05-23 ENCOUNTER — Ambulatory Visit: Payer: Medicare Other | Admitting: Internal Medicine

## 2021-05-31 DIAGNOSIS — Z932 Ileostomy status: Secondary | ICD-10-CM | POA: Diagnosis not present

## 2021-05-31 DIAGNOSIS — M25511 Pain in right shoulder: Secondary | ICD-10-CM | POA: Diagnosis not present

## 2021-06-18 NOTE — Progress Notes (Signed)
Office Visit Note  Patient: Erik MANJARRES Sr.             Date of Birth: 18-Jul-1946           MRN: [id number]             PCP: [name], MD Referring: [name], MD Visit Date: 06/19/2021   Subjective:  Follow-up (Patient complains of continued pain. Patient is taking Tramadol PRN. )   History of Present Illness: Erik CISNEY Sr. is a 75 y.o. male here for follow up for fibromyalgia and osteoarthrtis joint pains currently on low dose norco and PRN tizanidine and had been working with physical therapy.  He felt very unpleasant general effects with the tizanidine so only took this once.  He is interested in going back to the believe previous Flexeril that was partially beneficial.  He is having the most trouble still in the shoulders and knees bilaterally.  He is taking tramadol as needed pretty infrequent this is helpful.  He has seen orthopedic surgery about the right knee is under discussion waiting to schedule probable knee arthroplasty.  He feels the right calf pain has not improved very much.   Previous HPI: 04/25/21 Erik Lolling Sr. is a 75 y.o. male here for follow up for chronic joint pains, osteoarthritis, and fibromyalgia syndrome involving fatigue, IBS, cystitis, headaches, and cognitive difficulty.  Symptoms previously seen with Dr. Pasty Spillers low for about 15 years for chronic joint pain syndromes.  He was referred to see physical therapy due to knee arthritis symptoms he found working with them twice per week has improved his mobility and level of activity though the knee pain is equal or more persistent with a sharp painful sensation especially resting at night. Since last visit he is continued having bilateral shoulder and neck pain that has been at a fairly stable amount though his overall symptoms increased notably last week.  At that time he was found to have esophagitis also had increased urinary and IBS symptoms.  He is currently on treatment with antibiotic though  apparently urine culture was negative.  He is about 5 days through the antibiotics course with then oral fluoroquinolone and does feel his symptoms improving dramatically with this.  03/06/21 Erik Lolling Sr. is a 75 y.o. male here for evaluation of rheumatoid arthritis. He was a patient of Dr. Elmon Else for many years from 2001-2016 during which time he was treated conservatively for chronic joint pain with tramadol, NSAIDs, He was followed for over a decade for his significant, chronic joint pains with no evidence of inflammatory and erosive disease during that time. However symptoms were attributed to osteoarthritis changes and fibromyalgia syndrome related to his chronic pain. Symptoms including fatigue, pain, IBS, interstitial cystitis, headache, cognitive difficulty. Currently shoulder pain is bad also bilateral hips and low back and knees causing problems walking around and standing upright.   Review of Systems  Constitutional:  Positive for fatigue.  HENT:  Positive for bleeding gums, mouth dryness and nose dryness. Negative for mouth sores.   Eyes:  Positive for pain, itching, visual disturbance and dryness.  Respiratory:  Negative for cough, hemoptysis, shortness of breath and difficulty breathing.   Cardiovascular:  Positive for swelling in legs/feet. Negative for chest pain and palpitations.  Gastrointestinal:  Positive for constipation and diarrhea. Negative for abdominal pain and blood in stool.  Endocrine: Negative for increased urination.  Genitourinary:  Negative for painful urination.  Musculoskeletal:  Positive  for joint pain, joint pain, joint swelling, myalgias, muscle weakness, morning stiffness, muscle tenderness and myalgias.  Skin:  Negative for color change, rash and redness.  Allergic/Immunologic: Negative for susceptible to infections.  Neurological:  Positive for dizziness, headaches, memory loss and weakness. Negative for numbness.  Hematological:  Negative for  swollen glands.  Psychiatric/Behavioral:  Positive for confusion. Negative for sleep disturbance.    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   History of total cystectomy 06/11/2019   Cervical radiculopathy 04/11/2018   Bilateral leg pain 02/25/2018   Pain in joint of left shoulder 12/10/2017   Pelvic floor dysfunction 11/29/2017   Vitamin D deficiency 11/29/2017   CAD (coronary artery disease) 10/29/2017   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   Elevated PSA 10/09/2017   Degenerative arthritis of knee, bilateral 10/09/2017   Senile purpura ([place]) 10/14/2016   Medication intolerances 06/26/2016   Dyspepsia 06/26/2016   BPH (benign prostatic hypertrophy) with urinary obstruction 03/30/2016   Abnormal CT of the abdomen 11/10/2015   Right leg pain 09/01/2015   Bilateral shoulder pain 09/01/2015   Fatigue 06/28/2015   Bladder pain 03/25/2015   Polyarthralgia 02/02/2015   Abdominal pain, epigastric 02/01/2015   Bloating 02/01/2015   Bradycardia with 31 - 40 beats per minute 06/18/2014   Chest pain 03/16/2013   Hyperlipidemia 02/18/2013   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   Encounter for well adult exam with abnormal findings 02/23/2012   Allergic rhinitis    Insomnia    Diverticulitis    GERD with stricture    Helicobacter pylori gastritis    Internal and external hemorrhoids without complication     Depression 10/15/2011   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   Irritable bowel syndrome 06/09/2010   Interstitial cystitis 06/09/2010   Fibromyalgia 06/09/2010   FLATULENCE-GAS-BLOATING 06/09/2010    Past Medical History:  Diagnosis Date   Allergic rhinitis    Allergy    Anal fissure    Anemia    Anxiety    Blood transfusion without reported diagnosis    Cataract    starting   Coronary atherosclerosis of native coronary artery 2007   nonobstructive CAD by cath with 40% mid-LAD stenosis   Degenerative arthritis of knee, bilateral 10/09/2017   Depression    Diverticulosis 12/18/2011   Diverticulosis of colon with hemorrhage    April 2021, [facility] healthcare   Fibromyalgia    GERD with stricture    Helicobacter pylori (H. 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   Vitamin D deficiency 11/29/2017    Family History  Problem Relation Age of Onset   Colon polyps Father    Diabetes Father    Heart disease Father    Hypertension Father    Heart disease Mother    Hypertension Mother    Stroke Mother    Arthritis Mother    Diabetes Sister    Breast cancer Sister    Diabetes Paternal Uncle    Liver cancer Paternal Uncle    Liver cancer Maternal Grandfather    Colon cancer Neg Hx    Esophageal cancer Neg Hx    Rectal cancer Neg Hx    Stomach cancer Neg Hx    Pancreatic cancer Neg Hx    Past Surgical History:  Procedure Laterality Date   bladder distention     x 6    CATHETER REMOVAL     super pubic area   COLONOSCOPY     DENTAL SURGERY Right    #30 tooth extraction (right upper)   INTERSTIM IMPLANT PLACEMENT     INTERSTIM IMPLANT REMOVAL     KNEE ARTHROSCOPY     right x 2   NISSEN FUNDOPLICATION  123XX123   PROSTECTOMY     SHOULDER ARTHROSCOPY  2010   left   TONSILLECTOMY AND ADENOIDECTOMY  1957   TRANSURETHRAL RESECTION OF PROSTATE     x 2   UPPER GASTROINTESTINAL ENDOSCOPY  05/13/2008   hiatal hernia   VASECTOMY     Social History   Social History Narrative   Married, retired for children   He is a former smoker no alcohol or substance use   Immunization History  Administered Date(s) Administered   [employer] Vaccination 01/11/2020, 02/08/2020   Pneumococcal Polysaccharide-23 07/03/2013   Pneumococcal-Unspecified 11/12/2012   Tdap 08/23/2014     Objective: Vital Signs: BP 123/81 (BP Location: Left Arm, Patient Position: Sitting, Cuff Size: Normal)   Pulse 76   Ht 5' 6.5" (1.689 m)   Wt 169 lb 12.8 oz (77 kg)   BMI 27.00 kg/m    Physical Exam Skin:    General: Skin is warm and dry.     Findings: No rash.  Neurological:     General: No focal deficit present.     Mental Status: He is alert.  Psychiatric:        Mood and Affect: Mood normal.     Musculoskeletal Exam:  Shoulders pain worse laterally and anteriorly worsens with overhead abduction and extension not particularly worse with resistance Right knee decreased flexion and extension range of motion with crepitus and tenderness to palpation with no effusion General tenderness to pressure over posterior calf muscles worse towards the knee with no swelling or erythema  Investigation: No additional findings.  Imaging: No results found.  Recent Labs: Lab Results  Component Value Date   WBC 10.6 (H) 04/21/2021   HGB 14.9 04/21/2021   PLT 271.0 04/21/2021   NA 131 (L) 04/21/2021   K 3.7 04/21/2021   CL 98 04/21/2021   CO2 24 04/21/2021   GLUCOSE 129 (H)  04/21/2021   BUN 18 04/21/2021   CREATININE 1.35 04/21/2021   BILITOT 0.7 04/21/2021   ALKPHOS 65 04/21/2021   AST 12 04/21/2021   ALT 10 04/21/2021   PROT 7.8 04/21/2021   ALBUMIN 4.3 04/21/2021   CALCIUM 9.4 04/21/2021   GFRAA 57 (  L) 03/26/2017    Speciality Comments: No specialty comments available.  Procedures:  No procedures performed Allergies: Ambien [zolpidem], Bentyl [dicyclomine], Carafate [sucralfate], Lansoprazole, Lexapro [escitalopram oxalate], Pneumovax [pneumococcal polysaccharide vaccine], Prilosec [omeprazole], Remeron [mirtazapine], Seroquel [quetiapine], Statins, Tizanidine, Tramadol, and Tylenol [acetaminophen]   Assessment / Plan:     Visit Diagnoses: Acute pain of both shoulders  Ongoing shoulder symptoms seem most consistent with rotator cuff or bursitis type pathology rather than severe shoulder OA.  There is no inflammatory changes and no strength deficit on exam.  Provided printed range of motion exercise instructions for continuing to work on this at home.  Polyarthralgia Fibromyalgia  Not tolerating tizanidine and is not continuing to take this medicine.  Recommend he can go back to trying the Flexeril 1 or 2 tablets at night as needed for pains and sleep difficulty.  Osteoarthritis of both knees, unspecified osteoarthritis type  He is seeing orthopedic surgery believe would be a candidate for the right knee replacement given how much this is limiting his activities and a chronic pain generator.  Avoiding NSAIDs due to prior hematochezia.  Discussed turmeric as a supplement I believe this is reasonable at low-dose to take with food but monitor for any recurrence of bright red blood per rectum or epigastric pain.   Orders: No orders of the defined types were placed in this encounter.  Meds ordered this encounter  Medications   cyclobenzaprine (FLEXERIL) 5 MG tablet    Sig: 1-2 tablets by mouth daily/at night as needed    Dispense:  180 tablet     Refill:  1      Follow-Up Instructions: Return in about 6 months (around 12/20/2021).   Collier Salina, MD  Note - This record has been created using [employer].  Chart creation errors have been sought, but may not always  have been located. Such creation errors do not reflect on  the standard of medical care.

## 2021-06-19 ENCOUNTER — Encounter: Payer: Self-pay | Admitting: Internal Medicine

## 2021-06-19 ENCOUNTER — Other Ambulatory Visit: Payer: Self-pay

## 2021-06-19 ENCOUNTER — Ambulatory Visit: Payer: Medicare Other | Admitting: Internal Medicine

## 2021-06-19 VITALS — BP 123/81 | HR 76 | Ht 66.5 in | Wt 169.8 lb

## 2021-06-19 DIAGNOSIS — M2550 Pain in unspecified joint: Secondary | ICD-10-CM | POA: Diagnosis not present

## 2021-06-19 DIAGNOSIS — M25512 Pain in left shoulder: Secondary | ICD-10-CM

## 2021-06-19 DIAGNOSIS — M170 Bilateral primary osteoarthritis of knee: Secondary | ICD-10-CM | POA: Diagnosis not present

## 2021-06-19 DIAGNOSIS — M25511 Pain in right shoulder: Secondary | ICD-10-CM

## 2021-06-19 DIAGNOSIS — M797 Fibromyalgia: Secondary | ICD-10-CM

## 2021-06-19 MED ORDER — CYCLOBENZAPRINE HCL 5 MG PO TABS: ORAL_TABLET | ORAL | 1 refills | Status: DC

## 2021-06-21 ENCOUNTER — Other Ambulatory Visit: Payer: Self-pay

## 2021-06-21 DIAGNOSIS — D500 Iron deficiency anemia secondary to blood loss (chronic): Secondary | ICD-10-CM

## 2021-06-22 ENCOUNTER — Inpatient Hospital Stay: Payer: Medicare Other | Admitting: Hematology and Oncology

## 2021-06-22 ENCOUNTER — Telehealth: Payer: Self-pay

## 2021-06-22 ENCOUNTER — Telehealth: Payer: Self-pay | Admitting: Hematology and Oncology

## 2021-06-22 ENCOUNTER — Inpatient Hospital Stay: Payer: Medicare Other

## 2021-06-22 NOTE — Telephone Encounter (Signed)
R/s appts per 8/11 sch msg. PT aware.

## 2021-06-22 NOTE — Telephone Encounter (Signed)
Called and spoke to patient regarding appt today; he asked to be rescheduled. Scheduling message sent to set up appt at patient's earliest convenience.

## 2021-06-26 ENCOUNTER — Telehealth: Payer: Self-pay | Admitting: Internal Medicine

## 2021-06-26 NOTE — Telephone Encounter (Signed)
I sent him a My Chart message response - am behind on those  But I told him I am not going to answer about that as I do not recommend those supplements and do not have the knowledge

## 2021-06-26 NOTE — Telephone Encounter (Signed)
Pt called inquiring if it is ok to take Tumeric Curcumin  with Bioperine 1500 mg.? Pls call him.

## 2021-07-04 ENCOUNTER — Inpatient Hospital Stay: Payer: Medicare Other | Admitting: Hematology and Oncology

## 2021-07-04 ENCOUNTER — Inpatient Hospital Stay: Payer: Medicare Other

## 2021-07-11 DIAGNOSIS — Z932 Ileostomy status: Secondary | ICD-10-CM | POA: Diagnosis not present

## 2021-07-18 ENCOUNTER — Telehealth: Payer: Self-pay | Admitting: Internal Medicine

## 2021-07-18 NOTE — Telephone Encounter (Signed)
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 Now it is not letting him sleep fully and joints hurting and irritable and confused. These symptoms 2-3 weeks ago. He knows the date and the year. He can walk around. He feels his arms will fall off at night. He has had covid in the past but it didnt feel like this.  [facility] will be scanned into media tab

## 2021-07-19 ENCOUNTER — Other Ambulatory Visit: Payer: Self-pay

## 2021-07-19 ENCOUNTER — Ambulatory Visit (INDEPENDENT_AMBULATORY_CARE_PROVIDER_SITE_OTHER): Payer: Medicare Other | Admitting: Internal Medicine

## 2021-07-19 ENCOUNTER — Encounter: Payer: Self-pay | Admitting: Internal Medicine

## 2021-07-19 VITALS — BP 118/80 | HR 82 | Resp 18 | Ht 66.5 in | Wt 167.2 lb

## 2021-07-19 DIAGNOSIS — F32A Depression, unspecified: Secondary | ICD-10-CM | POA: Diagnosis not present

## 2021-07-19 DIAGNOSIS — G4700 Insomnia, unspecified: Secondary | ICD-10-CM | POA: Diagnosis not present

## 2021-07-19 DIAGNOSIS — E559 Vitamin D deficiency, unspecified: Secondary | ICD-10-CM

## 2021-07-19 MED ORDER — TEMAZEPAM 15 MG PO CAPS: ORAL_CAPSULE | ORAL | 2 refills | Status: DC

## 2021-07-19 NOTE — Patient Instructions (Signed)
Ok to stop the clonazepam and the trazodone  Please take all new medication as prescribed - the temazepam as needed for sleep  Please continue all other medications as before, and refills have been done if requested.  Please have the pharmacy call with any other refills you may need.  Please continue your efforts at being more active, low cholesterol diet, and weight control.  Please keep your appointments with your specialists as you may have planned

## 2021-07-22 NOTE — Assessment & Plan Note (Signed)
Worsening, for hange to temazepam qhs prn,  to f/u any worsening symptoms or concerns

## 2021-07-22 NOTE — Assessment & Plan Note (Signed)
Stable per pt, declines need for change in tx,  to f/u any worsening symptoms or concerns 

## 2021-07-22 NOTE — Progress Notes (Signed)
Patient ID: Erik Lolling Sr., male   DOB: [birth year], 75 y.o.   MRN: [id number]        Chief Complaint: follow up chronic insomnia, depression, low vit d       HPI:  Erik CETINA Sr. is a 75 y.o. male here with c/o 1 mo worsening persistent nightly insomnia just cant get to sleep, up all night many nights it seems, and prior meds no longer seems to be helping.  Does c/o ongoing fatigue, but denies signficant daytime hypersomnolence.Denies worsening reflux, abd pain, dysphagia, n/v, bowel change or blood except many things he eats just seems to go straight through him.  For knee surgury soon.  Denies worsening depressive symptoms, suicidal ideation, or panic; has ongoing anxiety.           Wt Readings from Last 3 Encounters:  07/19/21 167 lb 3.2 oz (75.8 kg)  06/19/21 169 lb 12.8 oz (77 kg)  05/18/21 169 lb (76.7 kg)   BP Readings from Last 3 Encounters:  07/19/21 118/80  06/19/21 123/81  05/18/21 130/72         Past Medical History:  Diagnosis Date   Allergic rhinitis    Allergy    Anal fissure    Anemia    Anxiety    Blood transfusion without reported diagnosis    Cataract    starting   Coronary atherosclerosis of native coronary artery 2007   nonobstructive CAD by cath with 40% mid-LAD stenosis   Degenerative arthritis of knee, bilateral 10/09/2017   Depression    Diverticulosis 12/18/2011   Diverticulosis of colon with hemorrhage    April 2021, [facility] healthcare   Fibromyalgia    GERD with stricture    Helicobacter pylori (H. 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   Vitamin D deficiency 11/29/2017   Past Surgical History:  Procedure Laterality Date   bladder distention     x 6   CATHETER REMOVAL     super pubic area   COLONOSCOPY     DENTAL SURGERY Right    #30 tooth extraction (right upper)   INTERSTIM IMPLANT PLACEMENT     INTERSTIM IMPLANT REMOVAL     KNEE ARTHROSCOPY     right x 2   NISSEN FUNDOPLICATION  [id number]   PROSTECTOMY     SHOULDER ARTHROSCOPY  2010   left   TONSILLECTOMY AND ADENOIDECTOMY  1957   TRANSURETHRAL RESECTION OF PROSTATE     x 2   UPPER GASTROINTESTINAL ENDOSCOPY  05/13/2008   hiatal hernia   VASECTOMY      reports that he has quit smoking. He has never used smokeless tobacco. He reports that he does not drink alcohol and does not use drugs. family history includes Arthritis in his mother; Breast cancer in his sister; Colon polyps in his father; Diabetes in his father, paternal uncle, and sister; Heart disease in his father and mother;  Hypertension in his father and mother; Liver cancer in his maternal grandfather and paternal uncle; Stroke in his mother. Allergies  Allergen Reactions   Ambien [Zolpidem]    Bentyl [Dicyclomine]     Memory loss, anxiety, blurred vision   Carafate [Sucralfate] Nausea And Vomiting   Lansoprazole Diarrhea   Lexapro [Escitalopram Oxalate]    Pneumovax [Pneumococcal Polysaccharide Vaccine] Other (See Comments)    pain   Prilosec [Omeprazole] Other (See Comments)    Per patient joint pain with PPI's   Remeron [Mirtazapine]    Seroquel [Quetiapine] Other (See Comments)    "terrible reaction"   Statins Other (See Comments)    Joint pain   Tizanidine Other (See Comments)    Made patient "feel crazy"   Tramadol Other (See Comments)   Tylenol [Acetaminophen]    Current Outpatient Medications on File Prior to Visit  Medication Sig Dispense Refill   cyclobenzaprine (FLEXERIL) 5 MG tablet 1-2  tablets by mouth daily/at night as needed 180 tablet 1   famotidine (PEPCID) 40 MG tablet Take 1 tablet (40 mg total) by mouth 2 (two) times daily. 180 tablet 3   HYDROcodone-acetaminophen (NORCO/VICODIN) 5-325 MG tablet Take 1 tablet by mouth every 6 (six) hours as needed. 30 tablet 0   hydrOXYzine (ATARAX/VISTARIL) 10 MG tablet Take 1 tablet (10 mg total) by mouth at bedtime. 90 tablet 3   metoCLOPramide (REGLAN) 10 MG tablet Take 1 tablet (10 mg total) by mouth at bedtime. 30 tablet 1   traMADol (ULTRAM) 50 MG tablet tramadol 50 mg tablet  Take 1 tablet every 6 hours by oral route as needed for 5 days.     No current facility-administered medications on file prior to visit.        ROS:  All others reviewed and negative.  Objective        PE:  BP 118/80   Pulse 82   Resp 18   Ht 5' 6.5" (1.689 m)   Wt 167 lb 3.2 oz (75.8 kg)   SpO2 98%   BMI 26.58 kg/m                 Constitutional: Pt appears in NAD               HENT: Head: NCAT.                Right Ear: External ear normal.                 Left Ear: External ear normal.                Eyes: . Pupils are equal, round, and reactive to light. Conjunctivae and EOM are normal               Nose: without d/c or deformity               Neck: Neck supple. Gross normal ROM               Cardiovascular: Normal rate and regular rhythm.                 Pulmonary/Chest: Effort normal and breath sounds without rales or wheezing.                Abd:  Soft, NT, ND, + BS, no organomegaly               Neurological: Pt is alert. At baseline orientation, motor grossly intact  Skin: Skin is warm. No rashes, no other new lesions, LE edema - none               Psychiatric: Pt behavior is normal without agitation   Micro: none  Cardiac tracings I have personally interpreted today:  none  Pertinent Radiological findings (summarize): none   Lab Results  Component Value Date   WBC 10.6 (H) 04/21/2021   HGB 14.9 04/21/2021    HCT 44.2 04/21/2021   PLT 271.0 04/21/2021   GLUCOSE 129 (H) 04/21/2021   CHOL 163 06/30/2019   TRIG 132.0 06/30/2019   HDL 37.20 (L) 06/30/2019   LDLDIRECT 169.0 06/24/2018   LDLCALC 100 (H) 06/30/2019   ALT 10 04/21/2021   AST 12 04/21/2021   NA 131 (L) 04/21/2021   K 3.7 04/21/2021   CL 98 04/21/2021   CREATININE 1.35 04/21/2021   BUN 18 04/21/2021   CO2 24 04/21/2021   TSH 2.12 03/13/2021   PSA 0.00 (L) 06/30/2019   HGBA1C 5.9 06/30/2019   Assessment/Plan:  Erik Lolling Sr. is a 75 y.o. White or Caucasian [1] male with  has a past medical history of Allergic rhinitis, Allergy, Anal fissure, Anemia, Anxiety, Blood transfusion without reported diagnosis, Cataract, Coronary atherosclerosis of native coronary artery (2007), Degenerative arthritis of knee, bilateral (10/09/2017), Depression, Diverticulosis (12/18/2011), Diverticulosis of colon with hemorrhage, Fibromyalgia, GERD with stricture, Helicobacter pylori (H. pylori) infection (11/25/2012), Hiatal hernia, HLD (hyperlipidemia), HTN (hypertension), Hyperlipidemia, IBS (irritable bowel syndrome), Impotence of organic origin, Insomnia, Internal and external hemorrhoids without complication, Interstitial cystitis, Irritable bowel syndrome (06/09/2010), Osteoporosis, Pelvic floor dysfunction (11/29/2017), Reactive hypoglycemia, Rheumatoid arthritis(714.0), Somatization disorder (02/27/2012), and Vitamin D deficiency (11/29/2017).  Vitamin D deficiency Last vitamin D Lab Results  Component Value Date   VD25OH 36.16 11/10/2020   Low normal, to increase oral replacement by doubling  Insomnia Worsening, for hange to temazepam qhs prn,  to f/u any worsening symptoms or concerns  Depression Stable per pt, declines need for change in tx,  to f/u any worsening symptoms or concerns  Followup: No follow-ups on file.  Erik Cower, MD 07/22/2021 10:44 PM [facility]

## 2021-07-22 NOTE — Assessment & Plan Note (Signed)
Last vitamin D Lab Results  Component Value Date   VD25OH 36.16 11/10/2020   Low normal, to increase oral replacement by doubling

## 2021-07-25 DIAGNOSIS — G546 Phantom limb syndrome with pain: Secondary | ICD-10-CM | POA: Diagnosis not present

## 2021-07-25 DIAGNOSIS — M79601 Pain in right arm: Secondary | ICD-10-CM | POA: Diagnosis not present

## 2021-07-25 DIAGNOSIS — T7840XA Allergy, unspecified, initial encounter: Secondary | ICD-10-CM | POA: Diagnosis not present

## 2021-07-25 DIAGNOSIS — G4700 Insomnia, unspecified: Secondary | ICD-10-CM | POA: Diagnosis not present

## 2021-07-27 ENCOUNTER — Ambulatory Visit: Payer: Self-pay | Admitting: Orthopedic Surgery

## 2021-08-07 DIAGNOSIS — M25511 Pain in right shoulder: Secondary | ICD-10-CM | POA: Diagnosis not present

## 2021-08-07 DIAGNOSIS — M542 Cervicalgia: Secondary | ICD-10-CM | POA: Diagnosis not present

## 2021-08-07 DIAGNOSIS — M545 Low back pain, unspecified: Secondary | ICD-10-CM | POA: Diagnosis not present

## 2021-08-08 DIAGNOSIS — Z932 Ileostomy status: Secondary | ICD-10-CM | POA: Diagnosis not present

## 2021-08-09 ENCOUNTER — Other Ambulatory Visit: Payer: Self-pay | Admitting: Internal Medicine

## 2021-08-09 ENCOUNTER — Encounter: Payer: Self-pay | Admitting: Internal Medicine

## 2021-08-10 MED ORDER — LORAZEPAM 1 MG PO TABS: 1.0000 mg | ORAL_TABLET | Freq: Two times a day (BID) | ORAL | 1 refills | Status: DC | PRN

## 2021-08-21 DIAGNOSIS — K580 Irritable bowel syndrome with diarrhea: Secondary | ICD-10-CM | POA: Diagnosis not present

## 2021-08-21 DIAGNOSIS — Z906 Acquired absence of other parts of urinary tract: Secondary | ICD-10-CM | POA: Diagnosis not present

## 2021-08-21 DIAGNOSIS — R1030 Lower abdominal pain, unspecified: Secondary | ICD-10-CM | POA: Diagnosis not present

## 2021-08-21 DIAGNOSIS — R8290 Unspecified abnormal findings in urine: Secondary | ICD-10-CM | POA: Diagnosis not present

## 2021-08-21 DIAGNOSIS — R1084 Generalized abdominal pain: Secondary | ICD-10-CM | POA: Diagnosis not present

## 2021-08-21 DIAGNOSIS — Z9889 Other specified postprocedural states: Secondary | ICD-10-CM | POA: Diagnosis not present

## 2021-08-21 DIAGNOSIS — G8929 Other chronic pain: Secondary | ICD-10-CM | POA: Diagnosis not present

## 2021-08-21 DIAGNOSIS — R82998 Other abnormal findings in urine: Secondary | ICD-10-CM | POA: Diagnosis not present

## 2021-08-24 ENCOUNTER — Encounter: Payer: Self-pay | Admitting: Internal Medicine

## 2021-08-25 DIAGNOSIS — M25561 Pain in right knee: Secondary | ICD-10-CM | POA: Diagnosis not present

## 2021-08-25 MED ORDER — DIAZEPAM 5 MG PO TABS: ORAL_TABLET | ORAL | 1 refills | Status: DC

## 2021-08-25 NOTE — Progress Notes (Addendum)
Anesthesia Review:  PCP: DR Kathleene Hazel 07/19/21  Cardiologist : Chest x-ray : EKG : Echo : Stress test: Cardiac Cath :  Activity level:  Sleep Study/ CPAP : Fasting Blood Sugar :      / Checks Blood Sugar -- times a day:   Blood Thinner/ Instructions /Last Dose: ASA / Instructions/ Last Dose :

## 2021-08-25 NOTE — Progress Notes (Signed)
DUE TO COVID-19 ONLY ONE VISITOR IS ALLOWED TO COME WITH YOU AND STAY IN THE WAITING ROOM ONLY DURING PRE OP AND PROCEDURE DAY OF SURGERY.  2 VISITOR  MAY VISIT WITH YOU AFTER SURGERY IN YOUR PRIVATE ROOM DURING VISITING HOURS ONLY!  YOU NEED TO HAVE A COVID 19 TEST ON__  09/05/2021 @_  @_from  8am-3pm _____, THIS TEST MUST BE DONE BEFORE SURGERY,  Covid test is done at [place], [place] [address].  This is a drive thru.  No appt required. Please see map.                 Your procedure is scheduled on:    09/08/21   Report to [facility] Main  Entrance   Report to admitting at    1030 AM     Call this number if you have problems the morning of surgery [phone]    REMEMBER: NO  SOLID FOOD CANDY OR GUM AFTER MIDNIGHT. CLEAR LIQUIDS UNTIL    1015am       . NOTHING BY MOUTH EXCEPT CLEAR LIQUIDS UNTIL   1015am  . PLEASE FINISH ENSURE DRINK PER SURGEON ORDER  WHICH NEEDS TO BE COMPLETED AT   1015am       CLEAR LIQUID DIET   Foods Allowed                                                                    Coffee and tea, regular and decaf                            Fruit ices (not with fruit pulp)                                      Iced Popsicles                                    Carbonated beverages, regular and diet                                    Cranberry, grape and apple juices Sports drinks like Gatorade Lightly seasoned clear broth or consume(fat free) Sugar, honey syrup ___________________________________________________________________      BRUSH YOUR TEETH MORNING OF SURGERY AND RINSE YOUR MOUTH OUT, NO CHEWING GUM CANDY OR MINTS.     Take these medicines the morning of surgery with A SIP OF WATER:     DO NOT TAKE ANY DIABETIC MEDICATIONS DAY OF YOUR SURGERY                               You may not have any metal on your body including hair pins and              piercings  Do not wear jewelry, make-up, lotions, powders or perfumes,  deodorant             Do  not wear nail polish on your fingernails.  Do not shave  48 hours prior to surgery.              Men may shave face and neck.   Do not bring valuables to the hospital. [place].  Contacts, dentures or bridgework may not be worn into surgery.  Leave suitcase in the car. After surgery it may be brought to your room.     Patients discharged the day of surgery will not be allowed to drive home. IF YOU ARE HAVING SURGERY AND GOING HOME THE SAME DAY, YOU MUST HAVE AN ADULT TO DRIVE YOU HOME AND BE WITH YOU FOR 24 HOURS. YOU MAY GO HOME BY TAXI OR UBER OR ORTHERWISE, BUT AN ADULT MUST ACCOMPANY YOU HOME AND STAY WITH YOU FOR 24 HOURS.  Name and phone number of your driver:  Special Instructions: N/A              Please read over the following fact sheets you were given: _____________________________________________________________________  [facility] - Preparing for Surgery Before surgery, you can play an important role.  Because skin is not sterile, your skin needs to be as free of germs as possible.  You can reduce the number of germs on your skin by washing with CHG (chlorahexidine gluconate) soap before surgery.  CHG is an antiseptic cleaner which kills germs and bonds with the skin to continue killing germs even after washing. Please DO NOT use if you have an allergy to CHG or antibacterial soaps.  If your skin becomes reddened/irritated stop using the CHG and inform your nurse when you arrive at Short Stay. Do not shave (including legs and underarms) for at least 48 hours prior to the first CHG shower.  You may shave your face/neck. Please follow these instructions carefully:  1.  Shower with CHG Soap the night before surgery and the  morning of Surgery.  2.  If you choose to wash your hair, wash your hair first as usual with your  normal  shampoo.  3.  After you shampoo, rinse your hair and body thoroughly to remove  the  shampoo.                           4.  Use CHG as you would any other liquid soap.  You can apply chg directly  to the skin and wash                       Gently with a scrungie or clean washcloth.  5.  Apply the CHG Soap to your body ONLY FROM THE NECK DOWN.   Do not use on face/ open                           Wound or open sores. Avoid contact with eyes, ears mouth and genitals (private parts).                       Wash face,  Genitals (private parts) with your normal soap.             6.  Wash thoroughly, paying special attention to the area where your surgery  will be performed.  7.  Thoroughly rinse your body with warm  water from the neck down.  8.  DO NOT shower/wash with your normal soap after using and rinsing off  the CHG Soap.                9.  Pat yourself dry with a clean towel.            10.  Wear clean pajamas.            11.  Place clean sheets on your bed the night of your first shower and do not  sleep with pets. Day of Surgery : Do not apply any lotions/deodorants the morning of surgery.  Please wear clean clothes to the hospital/surgery center.  FAILURE TO FOLLOW THESE INSTRUCTIONS MAY RESULT IN THE CANCELLATION OF YOUR SURGERY PATIENT SIGNATURE_________________________________  NURSE SIGNATURE__________________________________  ________________________________________________________________________

## 2021-08-28 DIAGNOSIS — M25561 Pain in right knee: Secondary | ICD-10-CM | POA: Insufficient documentation

## 2021-08-29 ENCOUNTER — Encounter (HOSPITAL_COMMUNITY)
Admission: RE | Admit: 2021-08-29 | Discharge: 2021-08-29 | Disposition: A | Discharge status: Home / Self Care | Payer: Medicare Other | Source: Ambulatory Visit | Attending: Anesthesiology | Admitting: Anesthesiology

## 2021-08-29 DIAGNOSIS — H1132 Conjunctival hemorrhage, left eye: Secondary | ICD-10-CM | POA: Diagnosis not present

## 2021-08-29 NOTE — Progress Notes (Addendum)
COVID swab appointment:  09-06-21  COVID Vaccine Completed: Yes x2 Date COVID Vaccine completed: 01-11-20 02-08-20 Has received booster: COVID vaccine manufacturer:    Moderna     Date of COVID positive in last 90 days: N/A  PCP -Cathlean Cower, MD  Cardiologist - Dimas Alexandria, MD.  Last OV 2021, has not seen anyone since.  Chest x-ray - N/A EKG - 08-31-21 Epic Stress Test - 07-05-20 [facility] ECHO - greater than 2 years Cardiac Cath - greater than 2 years Pacemaker/ICD device last checked: Spinal Cord Stimulator:  Sleep Study - N/A CPAP -   Fasting Blood Sugar - N/A Checks Blood Sugar _____ times a day  Blood Thinner Instructions:  N/A Aspirin Instructions: Last Dose:  Activity level:  Can go up a flight of stairs and perform activities of daily living without stopping and without symptoms of chest pain or shortness of breath.    Anesthesia review:  CAD,  hx of chest pain, bradycardia.  Stop Bang 6 (results sent to PCP)  Patient denies shortness of breath, fever, cough and chest pain at PAT appointment   Patient verbalized understanding of instructions that were given to them at the PAT appointment. Patient was also instructed that they will need to review over the PAT instructions again at home before surgery.

## 2021-08-29 NOTE — Patient Instructions (Addendum)
DUE TO COVID-19 ONLY ONE VISITOR IS ALLOWED TO COME WITH YOU AND STAY IN THE WAITING ROOM ONLY DURING PRE OP AND PROCEDURE.   **NO VISITORS ARE ALLOWED IN THE SHORT STAY AREA OR RECOVERY ROOM!!**  IF YOU WILL BE ADMITTED INTO THE HOSPITAL YOU ARE ALLOWED ONLY TWO SUPPORT PEOPLE DURING VISITATION HOURS ONLY (7 AM -8PM)    Up to two visitors ages 23+ are allowed at one time in a patient's room.  The visitors may rotate out with other people throughout the day.  Additionally, up to two children between the ages of 44 and 29 are allowed and do not count toward the number of allowed visitors.  Children within this age range must be accompanied by an adult visitor.  One adult visitor may remain with the patient overnight and must be in the room by 8 PM.  COVID SWAB TESTING MUST BE COMPLETED ON:  Wednesday, 09-06-21, between the hours of 8 and 3  **MUST PRESENT COMPLETED FORM AT TESTING SITE**    [place] (backside of the building)  You are not required to quarantine, however you are required to wear a well-fitted mask when you are out and around people not in your household.  Hand Hygiene often Do NOT share personal items Notify your provider if you are in close contact with someone who has COVID or you develop fever 100.4 or greater, new onset of sneezing, cough, sore throat, shortness of breath or body aches.        Your procedure is scheduled on:  Friday, 09-08-21   Report to [facility] Main  Entrance     Report to admitting at 10:30 AM   Call this number if you have problems the morning of surgery [phone]   Do not eat food :After Midnight.   May have liquids until 10:15 AM day of surgery  CLEAR LIQUID DIET  Foods Allowed                                                                     Foods Excluded  Water, Black Coffee (no milk/no creamer) and tea, regular and decaf                              liquids that you cannot  Plain Jell-O in  any flavor  (No red)                         see through such as: Fruit ices (not with fruit pulp)                                 milk, soups, orange juice  Iced Popsicles (No red)                                    All solid food                             Apple juices  Sports drinks like Gatorade (No red) Lightly seasoned clear broth or consume(fat free) Sugar     Complete one Ensure drink the morning of surgery at  10:15 AM  the day of surgery.     The day of surgery:  Drink ONE (1) Pre-Surgery Clear Ensure the morning of surgery. Drink in one sitting. Do not sip.  This drink was given to you during your hospital  pre-op appointment visit. Nothing else to drink after completing the Pre-Surgery Clear Ensure.          If you have questions, please contact your surgeon's office.     Oral Hygiene is also important to reduce your risk of infection.                                    Remember - BRUSH YOUR TEETH THE MORNING OF SURGERY WITH YOUR REGULAR TOOTHPASTE   Do NOT smoke after Midnight   Take these medicines the morning of surgery with A SIP OF WATER:  Famotidine, Lorazepam, Nortriptyline, Tramadol if needed   Stop all vitamins and herbal supplements a week before surgery             You may not have any metal on your body including jewelry, and body piercing             Do not wear  lotions, powders, cologne, or deodorant              Men may shave face and neck.  Do not bring valuables to the hospital. [place].   Contacts, dentures or bridgework may not be worn into surgery.   Bring small overnight bag day of surgery.    Special Instructions: Bring a copy of your healthcare power of attorney and living will documents the day of surgery if you haven't scanned them in before.  Please read over the following fact sheets you were given: IF YOU HAVE QUESTIONS ABOUT YOUR PRE OP INSTRUCTIONS PLEASE CALL [place]  - Preparing for Surgery Before surgery, you can play an important role.  Because skin is not sterile, your skin needs to be as free of germs as possible.  You can reduce the number of germs on your skin by washing with CHG (chlorahexidine gluconate) soap before surgery.  CHG is an antiseptic cleaner which kills germs and bonds with the skin to continue killing germs even after washing. Please DO NOT use if you have an allergy to CHG or antibacterial soaps.  If your skin becomes reddened/irritated stop using the CHG and inform your nurse when you arrive at Short Stay. Do not shave (including legs and underarms) for at least 48 hours prior to the first CHG shower.  You may shave your face/neck.  Please follow these instructions carefully:  1.  Shower with CHG Soap the night before surgery and the  morning of surgery.  2.  If you choose to wash your hair, wash your hair first as usual with your normal  shampoo.  3.  After you shampoo, rinse your hair and body thoroughly to remove the shampoo.                             4.  Use CHG as you would any other liquid soap.  You can apply chg directly  to the skin and wash.  Gently with a scrungie or clean washcloth.  5.  Apply the CHG Soap to your body ONLY FROM THE NECK DOWN.   Do   not use on face/ open                           Wound or open sores. Avoid contact with eyes, ears mouth and   genitals (private parts).                       Wash face,  Genitals (private parts) with your normal soap.             6.  Wash thoroughly, paying special attention to the area where your    surgery  will be performed.  7.  Thoroughly rinse your body with warm water from the neck down.  8.  DO NOT shower/wash with your normal soap after using and rinsing off the CHG Soap.                9.  Pat yourself dry with a clean towel.            10.  Wear clean pajamas.            11.  Place clean sheets on your bed the night of your first shower and do not  sleep with  pets. Day of Surgery : Do not apply any lotions/deodorants the morning of surgery.  Please wear clean clothes to the hospital/surgery center.  FAILURE TO FOLLOW THESE INSTRUCTIONS MAY RESULT IN THE CANCELLATION OF YOUR SURGERY  PATIENT SIGNATURE_________________________________  NURSE SIGNATURE__________________________________  ________________________________________________________________________   [name]  An incentive spirometer is a tool that can help keep your lungs clear and active. This tool measures how well you are filling your lungs with each breath. Taking long deep breaths may help reverse or decrease the chance of developing breathing (pulmonary) problems (especially infection) following: A long period of time when you are unable to move or be active. BEFORE THE PROCEDURE  If the spirometer includes an indicator to show your best effort, your nurse or respiratory therapist will set it to a desired goal. If possible, sit up straight or lean slightly forward. Try not to slouch. Hold the incentive spirometer in an upright position. INSTRUCTIONS FOR USE  Sit on the edge of your bed if possible, or sit up as far as you can in bed or on a chair. Hold the incentive spirometer in an upright position. Breathe out normally. Place the mouthpiece in your mouth and seal your lips tightly around it. Breathe in slowly and as deeply as possible, raising the piston or the ball toward the top of the column. Hold your breath for 3-5 seconds or for as long as possible. Allow the piston or ball to fall to the bottom of the column. Remove the mouthpiece from your mouth and breathe out normally. Rest for a few seconds and repeat Steps 1 through 7 at least 10 times every 1-2 hours when you are awake. Take your time and take a few normal breaths between deep breaths. The spirometer may include an indicator to show your best effort. Use the indicator as a goal to work toward during  each repetition. After each set of 10 deep breaths, practice coughing to be sure your lungs are clear. If you have an incision (the cut made at the time of surgery),  support your incision when coughing by placing a pillow or rolled up towels firmly against it. Once you are able to get out of bed, walk around indoors and cough well. You may stop using the incentive spirometer when instructed by your caregiver.  RISKS AND COMPLICATIONS Take your time so you do not get dizzy or light-headed. If you are in pain, you may need to take or ask for pain medication before doing incentive spirometry. It is harder to take a deep breath if you are having pain. AFTER USE Rest and breathe slowly and easily. It can be helpful to keep track of a log of your progress. Your caregiver can provide you with a simple table to help with this. If you are using the spirometer at home, follow these instructions: [place] IF:  You are having difficultly using the spirometer. You have trouble using the spirometer as often as instructed. Your pain medication is not giving enough relief while using the spirometer. You develop fever of 100.5 F (38.1 C) or higher. SEEK IMMEDIATE MEDICAL CARE IF:  You cough up bloody sputum that had not been present before. You develop fever of 102 F (38.9 C) or greater. You develop worsening pain at or near the incision site. MAKE SURE YOU:  Understand these instructions. Will watch your condition. Will get help right away if you are not doing well or get worse. Document Released: 03/11/2007 Document Revised: 01/21/2012 Document Reviewed: 05/12/2007 [facility] Patient Information 2014 [place], [place].   ________________________________________________________________________

## 2021-08-31 ENCOUNTER — Other Ambulatory Visit: Payer: Self-pay

## 2021-08-31 ENCOUNTER — Encounter (HOSPITAL_COMMUNITY)
Admission: RE | Admit: 2021-08-31 | Discharge: 2021-08-31 | Disposition: A | Discharge status: Home / Self Care | Payer: Medicare Other | Source: Ambulatory Visit | Attending: Specialist | Admitting: Specialist

## 2021-08-31 ENCOUNTER — Encounter (HOSPITAL_COMMUNITY): Payer: Self-pay

## 2021-08-31 DIAGNOSIS — Z01818 Encounter for other preprocedural examination: Secondary | ICD-10-CM | POA: Insufficient documentation

## 2021-08-31 HISTORY — DX: Pneumonia, unspecified organism: J18.9

## 2021-08-31 LAB — SURGICAL PCR SCREEN
MRSA, PCR: NEGATIVE
Staphylococcus aureus: NEGATIVE

## 2021-08-31 LAB — CBC
HCT: 48.5 % (ref 39.0–52.0)
Hemoglobin: 16.3 g/dL (ref 13.0–17.0)
MCH: 29.8 pg (ref 26.0–34.0)
MCHC: 33.6 g/dL (ref 30.0–36.0)
MCV: 88.7 fL (ref 80.0–100.0)
Platelets: 230 10*3/uL (ref 150–400)
RBC: 5.47 MIL/uL (ref 4.22–5.81)
RDW: 12.8 % (ref 11.5–15.5)
WBC: 6.2 10*3/uL (ref 4.0–10.5)
nRBC: 0 % (ref 0.0–0.2)

## 2021-08-31 LAB — BASIC METABOLIC PANEL
Anion gap: 10 (ref 5–15)
BUN: 21 mg/dL (ref 8–23)
CO2: 21 mmol/L — ABNORMAL LOW (ref 22–32)
Calcium: 9.2 mg/dL (ref 8.9–10.3)
Chloride: 102 mmol/L (ref 98–111)
Creatinine, Ser: 1.42 mg/dL — ABNORMAL HIGH (ref 0.61–1.24)
GFR, Estimated: 52 mL/min — ABNORMAL LOW (ref >60)
Glucose, Bld: 184 mg/dL — ABNORMAL HIGH (ref 70–99)
Potassium: 4.1 mmol/L (ref 3.5–5.1)
Sodium: 133 mmol/L — ABNORMAL LOW (ref 135–145)

## 2021-08-31 NOTE — Progress Notes (Signed)
Anesthesia Chart Review:   Case: [id number] Date/Time: 09/08/21 1300   Procedure: TOTAL KNEE ARTHROPLASTY (Right: Knee)   Anesthesia type: Spinal   Pre-op diagnosis: Degenerative joint disease right knee   Location: [facility] ROOM 07 / [facility]   Surgeons: Susa Day, MD       DISCUSSION: Pt is 75 years old with hx CAD (40% LAD, 25% D1 on 2007 cath), HTN, RA, anemia  VS: BP (!) 150/77   Pulse 99   Temp (!) 36.4 C (Oral)   Resp 18   Ht 5\' 7"  (1.702 m)   Wt 73.6 kg   SpO2 100%   BMI 25.40 kg/m   PROVIDERS: - PCP is [name], MD who is aware of upcoming surgery. Last office visit 07/19/21 - Saw cardiologist Dimas Alexandria, MD (notes in care everywhere) in 2021 for an episode of chest pain. Stress test done, results low risk. Has not been seen by cardiology since   LABS: Labs reviewed: Acceptable for surgery. (all labs ordered are listed, but only abnormal results are displayed)  Labs Reviewed  BASIC METABOLIC PANEL - Abnormal; Notable for the following components:      Result Value   Sodium 133 (*)    CO2 21 (*)    Glucose, Bld 184 (*)    Creatinine, Ser 1.42 (*)    GFR, Estimated 52 (*)    All other components within normal limits  SURGICAL PCR SCREEN  CBC    EKG 08/31/21: NSR. Possible Anterior infarct , age undetermined. Stable compared to prior EKG 03/26/17   CV: Nuclear stress test 07/05/20 (care everywhere):  - Normal myocardial perfusion study  - No evidence for significant ischemia or scar is noted.  - Post stress: Global systolic function is normal. The ejection fraction calculated at 57%.    Cardiac cath 09/30/2006:  1. No evidence of obstructive coronary disease is noted.  There is plaquing with calcification in the left anterior descending.  The most severe region of apparent narrowing in the left anterior descending is just beyond the first diagonal, where there is 40 to perhaps 50% narrowing depending upon the view visualized.  The diagonal origin  contains 25-40% narrowing.  Irregularities are noted in the circumflex and in the right coronary mid body.  2. Normal left ventricular function.  3. No evidence of annuloaortic ectasia or aortic ascending aneurysm.  4. Normal left ventricular function.  5. Chest discomfort, etiology uncertain.  No high-grade obstructive coronary lesions are noted.  Past Medical History:  Diagnosis Date   Allergic rhinitis    Allergy    Anal fissure    Anemia    Anxiety    Blood transfusion without reported diagnosis    Cataract    starting   Coronary atherosclerosis of native coronary artery 2007   nonobstructive CAD by cath with 40% mid-LAD stenosis   Degenerative arthritis of knee, bilateral 10/09/2017   Depression    Diverticulosis 12/18/2011   Diverticulosis of colon with hemorrhage    April 2021, [facility] healthcare   Fibromyalgia    GERD with stricture    Helicobacter pylori (H. 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   Vitamin D deficiency 11/29/2017    Past Surgical History:  Procedure Laterality Date   bladder distention     x 6   CATHETER REMOVAL     super pubic area   COLONOSCOPY     DENTAL SURGERY Right    #30 tooth extraction (right upper)   INTERSTIM IMPLANT PLACEMENT     INTERSTIM IMPLANT REMOVAL     KNEE ARTHROSCOPY     right x 2   NISSEN FUNDOPLICATION  5625   PROSTECTOMY     ROBOT ASSISTED LAPAROSCOPIC COMPLETE CYSTECT ILEAL CONDUIT     SHOULDER ARTHROSCOPY   2010   left   TONSILLECTOMY AND ADENOIDECTOMY  1957   TRANSURETHRAL RESECTION OF PROSTATE     x 2   UPPER GASTROINTESTINAL ENDOSCOPY  05/13/2008   hiatal hernia   VASECTOMY      MEDICATIONS:  pantoprazole (PROTONIX) 40 MG tablet   predniSONE (STERAPRED UNI-PAK 21 TAB) 10 MG (21) TBPK tablet   cholecalciferol (VITAMIN D3) 25 MCG (1000 UNIT) tablet   cyclobenzaprine (FLEXERIL) 5 MG tablet   diazepam (VALIUM) 5 MG tablet   famotidine (PEPCID) 40 MG tablet   HYDROcodone-acetaminophen (NORCO/VICODIN) 5-325 MG tablet   hydrOXYzine (ATARAX/VISTARIL) 10 MG tablet   LORazepam (ATIVAN) 1 MG tablet   metoCLOPramide (REGLAN) 10 MG tablet   nortriptyline (PAMELOR) 10 MG capsule   Polyethyl Glycol-Propyl Glycol (SYSTANE OP)   traMADol (ULTRAM) 50 MG tablet   trolamine salicylate (ASPERCREME) 10 % cream   No current facility-administered medications for this encounter.    If no changes, I anticipate pt can proceed with surgery as scheduled.   Willeen Cass, PhD, FNP-BC [facility] Short Stay Surgical Center/Anesthesiology Phone: [phone] 08/31/2021 3:29 PM

## 2021-08-31 NOTE — Anesthesia Preprocedure Evaluation (Addendum)
Anesthesia Evaluation  Patient identified by MRN, date of birth, ID band Patient awake    Reviewed: Allergy & Precautions, NPO status , Patient's Chart, lab work & pertinent test results  Airway Mallampati: III  TM Distance: >3 FB Neck ROM: Full    Dental  (+) Teeth Intact, Dental Advisory Given   Pulmonary former smoker,    breath sounds clear to auscultation       Cardiovascular hypertension, + CAD and + Peripheral Vascular Disease   Rhythm:Regular Rate:Normal     Neuro/Psych PSYCHIATRIC DISORDERS Anxiety Depression    GI/Hepatic Neg liver ROS, hiatal hernia, GERD  ,  Endo/Other  negative endocrine ROS  Renal/GU negative Renal ROS     Musculoskeletal  (+) Arthritis , Rheumatoid disorders,  Fibromyalgia -  Abdominal Normal abdominal exam  (+)   Peds  Hematology negative hematology ROS (+)   Anesthesia Other Findings   Reproductive/Obstetrics                           Anesthesia Physical Anesthesia Plan  ASA: 2  Anesthesia Plan: Spinal   Post-op Pain Management:  Regional for Post-op pain   Induction: Intravenous  PONV Risk Score and Plan: 2 and Ondansetron and Propofol infusion  Airway Management Planned: Simple Face Mask and Natural Airway  Additional Equipment: None  Intra-op Plan:   Post-operative Plan:   Informed Consent: I have reviewed the patients History and Physical, chart, labs and discussed the procedure including the risks, benefits and alternatives for the proposed anesthesia with the patient or authorized representative who has indicated his/her understanding and acceptance.     Dental advisory given  Plan Discussed with: CRNA  Anesthesia Plan Comments: (See APP note by Durel Salts, FNP )      Anesthesia Quick Evaluation

## 2021-08-31 NOTE — Progress Notes (Signed)
   08/31/21 0818  OBSTRUCTIVE SLEEP APNEA  Have you ever been diagnosed with sleep apnea through a sleep study? No  Do you snore loudly (loud enough to be heard through closed doors)?  1  Do you often feel tired, fatigued, or sleepy during the daytime (such as falling asleep during driving or talking to someone)? 1  Has anyone observed you stop breathing during your sleep? 1  Do you have, or are you being treated for high blood pressure? 1  BMI more than 35 kg/m2? 0  Age > 50 (1-yes) 1  Neck circumference greater than:Male 16 inches or larger, Male 17inches or larger? 0  Male Gender (Yes=1) 1  Obstructive Sleep Apnea Score 6  Score 5 or greater  Results sent to PCP

## 2021-09-01 ENCOUNTER — Other Ambulatory Visit: Payer: Self-pay | Admitting: Internal Medicine

## 2021-09-01 MED ORDER — DIAZEPAM 5 MG PO TABS: ORAL_TABLET | ORAL | 0 refills | Status: DC

## 2021-09-04 DIAGNOSIS — Z932 Ileostomy status: Secondary | ICD-10-CM | POA: Diagnosis not present

## 2021-09-06 ENCOUNTER — Other Ambulatory Visit: Payer: Self-pay | Admitting: Specialist

## 2021-09-06 DIAGNOSIS — M25511 Pain in right shoulder: Secondary | ICD-10-CM | POA: Diagnosis not present

## 2021-09-06 DIAGNOSIS — M542 Cervicalgia: Secondary | ICD-10-CM | POA: Diagnosis not present

## 2021-09-07 ENCOUNTER — Ambulatory Visit: Payer: Self-pay | Admitting: Orthopedic Surgery

## 2021-09-07 LAB — SARS CORONAVIRUS 2 (TAT 6-24 HRS): SARS Coronavirus 2: NEGATIVE

## 2021-09-07 NOTE — H&P (View-Only) (Signed)
Erik Lolling Sr. is an 75 y.o. male.   Chief Complaint: right knee pain HPI: The patient is here for his H&P. He is scheduled for a right total knee replacement by Dr. Tonita Cong on 09/08/21 at [facility].  He reports ongoing R knee pain refractory to conservative tx including injection therapy, quad strengthening, activity modifications, relative rest, medications. Pain is interfering with ADLs and QOL and he desires to proceed with surgery.  Dr. Tonita Cong and the patient mutually agreed to proceed with a total knee replacement. Risks and benefits of the procedure were discussed including stiffness, suboptimal range of motion, persistent pain, infection requiring removal of prosthesis and reinsertion, need for prophylactic antibiotics in the future, for example, dental procedures, possible need for manipulation, revision in the future and also anesthetic complications including DVT, PE, etc. We discussed the perioperative course, time in the hospital, postoperative recovery and the need for elevation to control swelling. We also discussed the predicted range of motion and the probability that squatting and kneeling would be unobtainable in the future. In addition, postoperative anticoagulation was discussed. We have obtained preoperative medical clearance as necessary. Provided illustrated handout and discussed it in detail. They will enroll in the total joint replacement educational forum at the hospital.  Reports no hx of DVT or MRSA. Prior hx GI bleed, unable to take NSAIDs. He does have a urostomy. He is concerned about his wife's ability to help care for him post-op as she has some stages of dementia. He is also awaiting an MRI of the neck to see further tx for that as it's currently more painful than the knee.  Past Medical History:  Diagnosis Date   Allergic rhinitis    Allergy    Anal fissure    Anemia    Anxiety    Blood transfusion without reported diagnosis    Cataract    starting    Coronary atherosclerosis of native coronary artery 2007   nonobstructive CAD by cath with 40% mid-LAD stenosis   Degenerative arthritis of knee, bilateral 10/09/2017   Depression    Diverticulosis 12/18/2011   Diverticulosis of colon with hemorrhage    April 2021, [facility] healthcare   Fibromyalgia    GERD with stricture    Helicobacter pylori (H. 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   Vitamin D deficiency 11/29/2017    Past Surgical History:  Procedure Laterality Date   bladder distention     x 6   CATHETER REMOVAL     super pubic area   COLONOSCOPY     DENTAL SURGERY Right    #30 tooth extraction (right upper)   INTERSTIM IMPLANT PLACEMENT     INTERSTIM IMPLANT REMOVAL     KNEE ARTHROSCOPY     right x 2   NISSEN FUNDOPLICATION  2800   PROSTECTOMY     ROBOT ASSISTED LAPAROSCOPIC COMPLETE CYSTECT ILEAL CONDUIT  SHOULDER ARTHROSCOPY  2010   left   TONSILLECTOMY AND ADENOIDECTOMY  1957   TRANSURETHRAL RESECTION OF PROSTATE     x 2   UPPER GASTROINTESTINAL ENDOSCOPY  05/13/2008   hiatal hernia   VASECTOMY      Family History  Problem Relation Age of Onset   Colon polyps Father    Diabetes Father    Heart disease Father    Hypertension Father    Heart disease Mother    Hypertension Mother    Stroke Mother    Arthritis Mother    Diabetes Sister    Breast cancer Sister    Diabetes Paternal Uncle    Liver cancer Paternal Uncle    Liver cancer  Maternal Grandfather    Colon cancer Neg Hx    Esophageal cancer Neg Hx    Rectal cancer Neg Hx    Stomach cancer Neg Hx    Pancreatic cancer Neg Hx    Social History:  reports that he has quit smoking. He has never used smokeless tobacco. He reports that he does not drink alcohol and does not use drugs.  Allergies:  Allergies  Allergen Reactions   Ambien [Zolpidem]     Unknown reaction   Bentyl [Dicyclomine]     Memory loss, anxiety, blurred vision   Carafate [Sucralfate] Nausea And Vomiting   Lansoprazole Diarrhea   Lexapro [Escitalopram Oxalate]    Other     Pt states he is sensitive to all oral medications   Pneumovax [Pneumococcal Polysaccharide Vaccine] Other (See Comments)    pain   Prilosec [Omeprazole] Other (See Comments)    Per patient joint pain with PPI's   Remeron [Mirtazapine]    Seroquel [Quetiapine] Other (See Comments)    "terrible reaction"   Statins Other (See Comments)    Joint pain   Tizanidine Other (See Comments)    Made patient "feel crazy"   Tramadol Other (See Comments)    Can only take for 2 takes then he starts to have pain   Tylenol [Acetaminophen]     Can only take for 2 takes then he starts to have pain    Medications: famotidine 40 mg tablet LORazepam 0.5 mg tablet nortriptyline   Results for orders placed or performed in visit on 09/06/21 (from the past 48 hour(s))  SARS Coronavirus 2 (TAT 6-24 hrs)     Status: None   Collection Time: 09/06/21 12:00 AM  Result Value Ref Range   SARS Coronavirus 2 RESULT: NEGATIVE     Comment: RESULT: NEGATIVESARS-CoV-2 INTERPRETATION:A NEGATIVE  test result means that SARS-CoV-2 RNA was not present in the specimen above the limit of detection of this test. This does not preclude a possible SARS-CoV-2 infection and should not be used as the  sole basis for patient management decisions. Negative results must be combined with clinical observations, patient history, and epidemiological information.  Optimum specimen types and timing for peak viral levels during infections caused by SARS-CoV-2  have not been determined. Collection of multiple specimens or types of specimens may be necessary to detect virus. Improper specimen collection and handling, sequence variability under primers/probes, or organism present below the limit of detection may  lead to false negative results. Positive and negative predictive values of testing are highly dependent on prevalence. False negative test results are more likely when prevalence of disease is high.The expected result is NEGATIVE.Fact S heet for  Healthcare Providers: LocalChronicle.no Sheet for Patients: SalonLookup.es Reference Range - Negative    No  results found.  Review of Systems  Constitutional: Negative.   HENT: Negative.    Eyes: Negative.   Respiratory: Negative.    Gastrointestinal: Negative.   Endocrine: Negative.   Genitourinary: Negative.   Musculoskeletal:  Positive for arthralgias, gait problem, myalgias, neck pain and neck stiffness.  Skin: Negative.   Hematological: Negative.    There were no vitals taken for this visit. Physical Exam Constitutional:      Appearance: Normal appearance.  HENT:     Head: Normocephalic.     Right Ear: External ear normal.     Left Ear: External ear normal.     Nose: Nose normal.     Mouth/Throat:     Pharynx: Oropharynx is clear.  Eyes:     Conjunctiva/sclera: Conjunctivae normal.  Cardiovascular:     Rate and Rhythm: Normal rate and regular rhythm.     Pulses: Normal pulses.  Pulmonary:     Effort: Pulmonary effort is normal.     Breath sounds: Normal breath sounds.  Abdominal:     General: Bowel sounds are normal.  Musculoskeletal:     Cervical back: Normal range of motion.     Comments: Walks an antalgic gait. Tender medial joint line mild effusion. Ranges -5 -1 10. Patient does have a slight varus laxity. No DVT.  Ipsilateral hip and ankle exam is unremarkable.  Skin:    General: Skin is warm and dry.  Neurological:     Mental Status: He is alert.    Xrays with end-stage R knee DJD, bone on bone medially with varus deformity.  Assessment/Plan Impression: R knee end-stage DJD  Plan: Pt with end-stage R knee DJD, bone-on-bone, refractory to conservative tx, scheduled for R total knee replacement by Dr. Tonita Cong on 10-28. We again discussed the procedure itself as well as risks, complications and alternatives, including but not limited to DVT, PE, infx, bleeding, failure of procedure, need for secondary procedure including manipulation, nerve injury, ongoing pain/symptoms, anesthesia risk, even stroke or death. Also discussed typical post-op protocols, activity restrictions, need for PT, flexion/extension exercises, time out of work. Discussed need for DVT ppx post-op per protocol. Discussed dental ppx and infx prevention. Also discussed limitations post-operatively such as kneeling and squatting. All questions were answered. Patient desires to proceed with surgery as scheduled.   Will hold supplements, ASA and NSAIDs accordingly. Will remain NPO after midnight the night before surgery. Will present to [facility] for pre-op testing. Anticipate hospital stay to include at least 2 midnights given medical history and to ensure proper pain control. Plan ASA for DVT ppx post-op. Plan oxy, Robaxin, Colace, Miralax. Plan home with OPPT post-op with family members at home for assistance. Will follow up 10-14 days post-op for staple removal and xrays.  Plan Right total knee replacement  Cecilie Kicks, PA-C for Dr Tonita Cong 09/07/2021, 5:52 PM

## 2021-09-07 NOTE — H&P (Signed)
Erik Lolling Sr. is an 75 y.o. male.   Chief Complaint: right knee pain HPI: The patient is here for his H&P. He is scheduled for a right total knee replacement by Dr. Tonita Cong on 09/08/21 at [facility].  He reports ongoing R knee pain refractory to conservative tx including injection therapy, quad strengthening, activity modifications, relative rest, medications. Pain is interfering with ADLs and QOL and he desires to proceed with surgery.  Dr. Tonita Cong and the patient mutually agreed to proceed with a total knee replacement. Risks and benefits of the procedure were discussed including stiffness, suboptimal range of motion, persistent pain, infection requiring removal of prosthesis and reinsertion, need for prophylactic antibiotics in the future, for example, dental procedures, possible need for manipulation, revision in the future and also anesthetic complications including DVT, PE, etc. We discussed the perioperative course, time in the hospital, postoperative recovery and the need for elevation to control swelling. We also discussed the predicted range of motion and the probability that squatting and kneeling would be unobtainable in the future. In addition, postoperative anticoagulation was discussed. We have obtained preoperative medical clearance as necessary. Provided illustrated handout and discussed it in detail. They will enroll in the total joint replacement educational forum at the hospital.  Reports no hx of DVT or MRSA. Prior hx GI bleed, unable to take NSAIDs. He does have a urostomy. He is concerned about his wife's ability to help care for him post-op as she has some stages of dementia. He is also awaiting an MRI of the neck to see further tx for that as it's currently more painful than the knee.  Past Medical History:  Diagnosis Date   Allergic rhinitis    Allergy    Anal fissure    Anemia    Anxiety    Blood transfusion without reported diagnosis    Cataract    starting    Coronary atherosclerosis of native coronary artery 2007   nonobstructive CAD by cath with 40% mid-LAD stenosis   Degenerative arthritis of knee, bilateral 10/09/2017   Depression    Diverticulosis 12/18/2011   Diverticulosis of colon with hemorrhage    April 2021, [facility] healthcare   Fibromyalgia    GERD with stricture    Helicobacter pylori (H. 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   Vitamin D deficiency 11/29/2017    Past Surgical History:  Procedure Laterality Date   bladder distention     x 6   CATHETER REMOVAL     super pubic area   COLONOSCOPY     DENTAL SURGERY Right    #30 tooth extraction (right upper)   INTERSTIM IMPLANT PLACEMENT     INTERSTIM IMPLANT REMOVAL     KNEE ARTHROSCOPY     right x 2   NISSEN FUNDOPLICATION  6222   PROSTECTOMY     ROBOT ASSISTED LAPAROSCOPIC COMPLETE CYSTECT ILEAL CONDUIT  SHOULDER ARTHROSCOPY  2010   left   TONSILLECTOMY AND ADENOIDECTOMY  1957   TRANSURETHRAL RESECTION OF PROSTATE     x 2   UPPER GASTROINTESTINAL ENDOSCOPY  05/13/2008   hiatal hernia   VASECTOMY      Family History  Problem Relation Age of Onset   Colon polyps Father    Diabetes Father    Heart disease Father    Hypertension Father    Heart disease Mother    Hypertension Mother    Stroke Mother    Arthritis Mother    Diabetes Sister    Breast cancer Sister    Diabetes Paternal Uncle    Liver cancer Paternal Uncle    Liver cancer  Maternal Grandfather    Colon cancer Neg Hx    Esophageal cancer Neg Hx    Rectal cancer Neg Hx    Stomach cancer Neg Hx    Pancreatic cancer Neg Hx    Social History:  reports that he has quit smoking. He has never used smokeless tobacco. He reports that he does not drink alcohol and does not use drugs.  Allergies:  Allergies  Allergen Reactions   Ambien [Zolpidem]     Unknown reaction   Bentyl [Dicyclomine]     Memory loss, anxiety, blurred vision   Carafate [Sucralfate] Nausea And Vomiting   Lansoprazole Diarrhea   Lexapro [Escitalopram Oxalate]    Other     Pt states he is sensitive to all oral medications   Pneumovax [Pneumococcal Polysaccharide Vaccine] Other (See Comments)    pain   Prilosec [Omeprazole] Other (See Comments)    Per patient joint pain with PPI's   Remeron [Mirtazapine]    Seroquel [Quetiapine] Other (See Comments)    "terrible reaction"   Statins Other (See Comments)    Joint pain   Tizanidine Other (See Comments)    Made patient "feel crazy"   Tramadol Other (See Comments)    Can only take for 2 takes then he starts to have pain   Tylenol [Acetaminophen]     Can only take for 2 takes then he starts to have pain    Medications: famotidine 40 mg tablet LORazepam 0.5 mg tablet nortriptyline   Results for orders placed or performed in visit on 09/06/21 (from the past 48 hour(s))  SARS Coronavirus 2 (TAT 6-24 hrs)     Status: None   Collection Time: 09/06/21 12:00 AM  Result Value Ref Range   SARS Coronavirus 2 RESULT: NEGATIVE     Comment: RESULT: NEGATIVESARS-CoV-2 INTERPRETATION:A NEGATIVE  test result means that SARS-CoV-2 RNA was not present in the specimen above the limit of detection of this test. This does not preclude a possible SARS-CoV-2 infection and should not be used as the  sole basis for patient management decisions. Negative results must be combined with clinical observations, patient history, and epidemiological information.  Optimum specimen types and timing for peak viral levels during infections caused by SARS-CoV-2  have not been determined. Collection of multiple specimens or types of specimens may be necessary to detect virus. Improper specimen collection and handling, sequence variability under primers/probes, or organism present below the limit of detection may  lead to false negative results. Positive and negative predictive values of testing are highly dependent on prevalence. False negative test results are more likely when prevalence of disease is high.The expected result is NEGATIVE.Fact S heet for  Healthcare Providers: LocalChronicle.no Sheet for Patients: SalonLookup.es Reference Range - Negative    No  results found.  Review of Systems  Constitutional: Negative.   HENT: Negative.    Eyes: Negative.   Respiratory: Negative.    Gastrointestinal: Negative.   Endocrine: Negative.   Genitourinary: Negative.   Musculoskeletal:  Positive for arthralgias, gait problem, myalgias, neck pain and neck stiffness.  Skin: Negative.   Hematological: Negative.    There were no vitals taken for this visit. Physical Exam Constitutional:      Appearance: Normal appearance.  HENT:     Head: Normocephalic.     Right Ear: External ear normal.     Left Ear: External ear normal.     Nose: Nose normal.     Mouth/Throat:     Pharynx: Oropharynx is clear.  Eyes:     Conjunctiva/sclera: Conjunctivae normal.  Cardiovascular:     Rate and Rhythm: Normal rate and regular rhythm.     Pulses: Normal pulses.  Pulmonary:     Effort: Pulmonary effort is normal.     Breath sounds: Normal breath sounds.  Abdominal:     General: Bowel sounds are normal.  Musculoskeletal:     Cervical back: Normal range of motion.     Comments: Walks an antalgic gait. Tender medial joint line mild effusion. Ranges -5 -1 10. Patient does have a slight varus laxity. No DVT.  Ipsilateral hip and ankle exam is unremarkable.  Skin:    General: Skin is warm and dry.  Neurological:     Mental Status: He is alert.    Xrays with end-stage R knee DJD, bone on bone medially with varus deformity.  Assessment/Plan Impression: R knee end-stage DJD  Plan: Pt with end-stage R knee DJD, bone-on-bone, refractory to conservative tx, scheduled for R total knee replacement by Dr. Tonita Cong on 10-28. We again discussed the procedure itself as well as risks, complications and alternatives, including but not limited to DVT, PE, infx, bleeding, failure of procedure, need for secondary procedure including manipulation, nerve injury, ongoing pain/symptoms, anesthesia risk, even stroke or death. Also discussed typical post-op protocols, activity restrictions, need for PT, flexion/extension exercises, time out of work. Discussed need for DVT ppx post-op per protocol. Discussed dental ppx and infx prevention. Also discussed limitations post-operatively such as kneeling and squatting. All questions were answered. Patient desires to proceed with surgery as scheduled.   Will hold supplements, ASA and NSAIDs accordingly. Will remain NPO after midnight the night before surgery. Will present to [facility] for pre-op testing. Anticipate hospital stay to include at least 2 midnights given medical history and to ensure proper pain control. Plan ASA for DVT ppx post-op. Plan oxy, Robaxin, Colace, Miralax. Plan home with OPPT post-op with family members at home for assistance. Will follow up 10-14 days post-op for staple removal and xrays.  Plan Right total knee replacement  Cecilie Kicks, PA-C for Dr Tonita Cong 09/07/2021, 5:52 PM

## 2021-09-08 ENCOUNTER — Encounter (HOSPITAL_COMMUNITY): Payer: Self-pay | Admitting: Specialist

## 2021-09-08 ENCOUNTER — Ambulatory Visit (HOSPITAL_COMMUNITY): Payer: Medicare Other

## 2021-09-08 ENCOUNTER — Encounter (HOSPITAL_COMMUNITY)
Admission: RE | Disposition: A | Discharge status: Home / Self Care | Payer: Self-pay | Source: Home / Self Care | Attending: Specialist

## 2021-09-08 ENCOUNTER — Ambulatory Visit (HOSPITAL_COMMUNITY): Payer: Medicare Other | Admitting: Emergency Medicine

## 2021-09-08 ENCOUNTER — Ambulatory Visit (HOSPITAL_COMMUNITY): Payer: Medicare Other | Admitting: Certified Registered Nurse Anesthetist

## 2021-09-08 ENCOUNTER — Inpatient Hospital Stay (HOSPITAL_COMMUNITY)
Admission: RE | Admit: 2021-09-08 | Discharge: 2021-09-11 | DRG: 470 | Disposition: A | Discharge status: Home / Self Care | Payer: Medicare Other | Attending: Specialist | Admitting: Specialist

## 2021-09-08 DIAGNOSIS — Z888 Allergy status to other drugs, medicaments and biological substances status: Secondary | ICD-10-CM | POA: Diagnosis not present

## 2021-09-08 DIAGNOSIS — Z20822 Contact with and (suspected) exposure to covid-19: Secondary | ICD-10-CM | POA: Diagnosis not present

## 2021-09-08 DIAGNOSIS — M25761 Osteophyte, right knee: Secondary | ICD-10-CM | POA: Diagnosis not present

## 2021-09-08 DIAGNOSIS — G4700 Insomnia, unspecified: Secondary | ICD-10-CM | POA: Diagnosis not present

## 2021-09-08 DIAGNOSIS — K449 Diaphragmatic hernia without obstruction or gangrene: Secondary | ICD-10-CM | POA: Diagnosis not present

## 2021-09-08 DIAGNOSIS — M069 Rheumatoid arthritis, unspecified: Secondary | ICD-10-CM | POA: Diagnosis not present

## 2021-09-08 DIAGNOSIS — M810 Age-related osteoporosis without current pathological fracture: Secondary | ICD-10-CM | POA: Diagnosis present

## 2021-09-08 DIAGNOSIS — M1711 Unilateral primary osteoarthritis, right knee: Secondary | ICD-10-CM | POA: Diagnosis not present

## 2021-09-08 DIAGNOSIS — Z8249 Family history of ischemic heart disease and other diseases of the circulatory system: Secondary | ICD-10-CM | POA: Diagnosis not present

## 2021-09-08 DIAGNOSIS — Z8261 Family history of arthritis: Secondary | ICD-10-CM | POA: Diagnosis not present

## 2021-09-08 DIAGNOSIS — M21161 Varus deformity, not elsewhere classified, right knee: Secondary | ICD-10-CM | POA: Diagnosis present

## 2021-09-08 DIAGNOSIS — Z885 Allergy status to narcotic agent status: Secondary | ICD-10-CM

## 2021-09-08 DIAGNOSIS — R350 Frequency of micturition: Secondary | ICD-10-CM | POA: Diagnosis not present

## 2021-09-08 DIAGNOSIS — M179 Osteoarthritis of knee, unspecified: Secondary | ICD-10-CM | POA: Diagnosis present

## 2021-09-08 DIAGNOSIS — Z887 Allergy status to serum and vaccine status: Secondary | ICD-10-CM | POA: Diagnosis not present

## 2021-09-08 DIAGNOSIS — K219 Gastro-esophageal reflux disease without esophagitis: Secondary | ICD-10-CM | POA: Diagnosis not present

## 2021-09-08 DIAGNOSIS — Z87891 Personal history of nicotine dependence: Secondary | ICD-10-CM | POA: Diagnosis not present

## 2021-09-08 DIAGNOSIS — K5790 Diverticulosis of intestine, part unspecified, without perforation or abscess without bleeding: Secondary | ICD-10-CM | POA: Diagnosis present

## 2021-09-08 DIAGNOSIS — E785 Hyperlipidemia, unspecified: Secondary | ICD-10-CM | POA: Diagnosis present

## 2021-09-08 DIAGNOSIS — Z9852 Vasectomy status: Secondary | ICD-10-CM

## 2021-09-08 DIAGNOSIS — M797 Fibromyalgia: Secondary | ICD-10-CM | POA: Diagnosis present

## 2021-09-08 DIAGNOSIS — R3915 Urgency of urination: Secondary | ICD-10-CM | POA: Diagnosis not present

## 2021-09-08 DIAGNOSIS — Z9079 Acquired absence of other genital organ(s): Secondary | ICD-10-CM

## 2021-09-08 DIAGNOSIS — E559 Vitamin D deficiency, unspecified: Secondary | ICD-10-CM | POA: Diagnosis not present

## 2021-09-08 DIAGNOSIS — I2510 Atherosclerotic heart disease of native coronary artery without angina pectoris: Secondary | ICD-10-CM | POA: Diagnosis present

## 2021-09-08 DIAGNOSIS — Z96659 Presence of unspecified artificial knee joint: Secondary | ICD-10-CM

## 2021-09-08 DIAGNOSIS — M7989 Other specified soft tissue disorders: Secondary | ICD-10-CM | POA: Diagnosis not present

## 2021-09-08 DIAGNOSIS — Z96651 Presence of right artificial knee joint: Secondary | ICD-10-CM | POA: Diagnosis not present

## 2021-09-08 DIAGNOSIS — G8918 Other acute postprocedural pain: Secondary | ICD-10-CM | POA: Diagnosis not present

## 2021-09-08 HISTORY — PX: TOTAL KNEE ARTHROPLASTY: SHX125

## 2021-09-08 LAB — BASIC METABOLIC PANEL
Anion gap: 9 (ref 5–15)
BUN: 19 mg/dL (ref 8–23)
CO2: 22 mmol/L (ref 22–32)
Calcium: 9 mg/dL (ref 8.9–10.3)
Chloride: 105 mmol/L (ref 98–111)
Creatinine, Ser: 1.26 mg/dL — ABNORMAL HIGH (ref 0.61–1.24)
GFR, Estimated: 60 mL/min — ABNORMAL LOW (ref >60)
Glucose, Bld: 192 mg/dL — ABNORMAL HIGH (ref 70–99)
Potassium: 3.7 mmol/L (ref 3.5–5.1)
Sodium: 136 mmol/L (ref 135–145)

## 2021-09-08 LAB — CBC
HCT: 46.2 % (ref 39.0–52.0)
Hemoglobin: 15.5 g/dL (ref 13.0–17.0)
MCH: 30.3 pg (ref 26.0–34.0)
MCHC: 33.5 g/dL (ref 30.0–36.0)
MCV: 90.2 fL (ref 80.0–100.0)
Platelets: 322 10*3/uL (ref 150–400)
RBC: 5.12 MIL/uL (ref 4.22–5.81)
RDW: 12.5 % (ref 11.5–15.5)
WBC: 8.6 10*3/uL (ref 4.0–10.5)
nRBC: 0 % (ref 0.0–0.2)

## 2021-09-08 SURGERY — ARTHROPLASTY, KNEE, TOTAL
Anesthesia: Spinal | Site: Knee | Laterality: Right

## 2021-09-08 MED ORDER — RISAQUAD PO CAPS
1.0000 | ORAL_CAPSULE | Freq: Every day | ORAL | Status: DC
  Administered 2021-09-08 – 2021-09-11 (×4): 1 via ORAL
  Filled 2021-09-08 (×4): qty 1

## 2021-09-08 MED ORDER — TRAMADOL HCL 50 MG PO TABS
50.0000 mg | ORAL_TABLET | Freq: Four times a day (QID) | ORAL | Status: DC | PRN
  Administered 2021-09-08: 50 mg via ORAL
  Filled 2021-09-08: qty 1

## 2021-09-08 MED ORDER — MEPERIDINE HCL 50 MG/ML IJ SOLN: 6.2500 mg | INTRAMUSCULAR | Status: DC | PRN

## 2021-09-08 MED ORDER — MENTHOL 3 MG MT LOZG: 1.0000 | LOZENGE | OROMUCOSAL | Status: DC | PRN

## 2021-09-08 MED ORDER — PHENYLEPHRINE HCL (PRESSORS) 10 MG/ML IV SOLN
INTRAVENOUS | Status: AC
  Filled 2021-09-08: qty 2

## 2021-09-08 MED ORDER — GLYCOPYRROLATE PF 0.2 MG/ML IJ SOSY
PREFILLED_SYRINGE | INTRAMUSCULAR | Status: DC | PRN
  Administered 2021-09-08: .1 mg via INTRAVENOUS

## 2021-09-08 MED ORDER — OXYCODONE HCL 5 MG PO TABS: 5.0000 mg | ORAL_TABLET | ORAL | 0 refills | Status: DC | PRN

## 2021-09-08 MED ORDER — DOCUSATE SODIUM 100 MG PO CAPS
100.0000 mg | ORAL_CAPSULE | Freq: Two times a day (BID) | ORAL | Status: DC
  Administered 2021-09-08 – 2021-09-11 (×6): 100 mg via ORAL
  Filled 2021-09-08 (×6): qty 1

## 2021-09-08 MED ORDER — AMISULPRIDE (ANTIEMETIC) 5 MG/2ML IV SOLN: 10.0000 mg | Freq: Once | INTRAVENOUS | Status: DC | PRN

## 2021-09-08 MED ORDER — ONDANSETRON HCL 4 MG PO TABS: 4.0000 mg | ORAL_TABLET | Freq: Four times a day (QID) | ORAL | Status: DC | PRN

## 2021-09-08 MED ORDER — ROPIVACAINE HCL 5 MG/ML IJ SOLN
INTRAMUSCULAR | Status: DC | PRN
  Administered 2021-09-08: 30 mL via PERINEURAL

## 2021-09-08 MED ORDER — HYDROMORPHONE HCL 1 MG/ML IJ SOLN: 0.2500 mg | INTRAMUSCULAR | Status: DC | PRN

## 2021-09-08 MED ORDER — PROPOFOL 10 MG/ML IV BOLUS
INTRAVENOUS | Status: DC | PRN
  Administered 2021-09-08 (×2): 20 mg via INTRAVENOUS
  Administered 2021-09-08: 10 mg via INTRAVENOUS

## 2021-09-08 MED ORDER — ACETAMINOPHEN 10 MG/ML IV SOLN: 1000.0000 mg | Freq: Once | INTRAVENOUS | Status: DC | PRN

## 2021-09-08 MED ORDER — DOCUSATE SODIUM 100 MG PO CAPS: 100.0000 mg | ORAL_CAPSULE | Freq: Two times a day (BID) | ORAL | 1 refills | Status: DC | PRN

## 2021-09-08 MED ORDER — HYDROMORPHONE HCL 1 MG/ML IJ SOLN
INTRAMUSCULAR | Status: AC
  Administered 2021-09-08: 0.5 mg via INTRAVENOUS
  Filled 2021-09-08: qty 1

## 2021-09-08 MED ORDER — METHOCARBAMOL 500 MG PO TABS
500.0000 mg | ORAL_TABLET | Freq: Four times a day (QID) | ORAL | Status: DC | PRN
  Administered 2021-09-08 – 2021-09-10 (×3): 500 mg via ORAL
  Filled 2021-09-08 (×3): qty 1

## 2021-09-08 MED ORDER — TRANEXAMIC ACID-NACL 1000-0.7 MG/100ML-% IV SOLN
1000.0000 mg | INTRAVENOUS | Status: AC
  Administered 2021-09-08: 1000 mg via INTRAVENOUS
  Filled 2021-09-08: qty 100

## 2021-09-08 MED ORDER — FAMOTIDINE 20 MG PO TABS
40.0000 mg | ORAL_TABLET | Freq: Two times a day (BID) | ORAL | Status: DC
  Filled 2021-09-08 (×3): qty 2

## 2021-09-08 MED ORDER — PHENYLEPHRINE HCL-NACL 20-0.9 MG/250ML-% IV SOLN
INTRAVENOUS | Status: DC | PRN
  Administered 2021-09-08: 40 ug/min via INTRAVENOUS

## 2021-09-08 MED ORDER — NON FORMULARY
Status: DC | PRN
  Administered 2021-09-08: 350 mL

## 2021-09-08 MED ORDER — BUPIVACAINE IN DEXTROSE 0.75-8.25 % IT SOLN
INTRATHECAL | Status: DC | PRN
  Administered 2021-09-08: 1.8 mL via INTRATHECAL

## 2021-09-08 MED ORDER — OXYCODONE HCL 5 MG PO TABS
10.0000 mg | ORAL_TABLET | ORAL | Status: DC | PRN
  Administered 2021-09-08 – 2021-09-11 (×11): 10 mg via ORAL
  Filled 2021-09-08 (×11): qty 2

## 2021-09-08 MED ORDER — ONDANSETRON HCL 4 MG/2ML IJ SOLN: 4.0000 mg | Freq: Four times a day (QID) | INTRAMUSCULAR | Status: DC | PRN

## 2021-09-08 MED ORDER — ASPIRIN 81 MG PO CHEW
81.0000 mg | CHEWABLE_TABLET | Freq: Two times a day (BID) | ORAL | Status: DC
  Administered 2021-09-09 (×2): 81 mg via ORAL
  Filled 2021-09-08 (×2): qty 1

## 2021-09-08 MED ORDER — POLYETHYLENE GLYCOL 3350 17 G PO PACK: 17.0000 g | PACK | Freq: Every day | ORAL | 0 refills | Status: DC

## 2021-09-08 MED ORDER — DIPHENHYDRAMINE HCL 12.5 MG/5ML PO ELIX: 12.5000 mg | ORAL_SOLUTION | ORAL | Status: DC | PRN

## 2021-09-08 MED ORDER — POLYVINYL ALCOHOL 1.4 % OP SOLN
Freq: Two times a day (BID) | OPHTHALMIC | Status: DC | PRN
  Filled 2021-09-08: qty 15

## 2021-09-08 MED ORDER — CEFAZOLIN SODIUM-DEXTROSE 2-4 GM/100ML-% IV SOLN
2.0000 g | INTRAVENOUS | Status: AC
  Administered 2021-09-08: 2 g via INTRAVENOUS
  Filled 2021-09-08: qty 100

## 2021-09-08 MED ORDER — ALUM & MAG HYDROXIDE-SIMETH 200-200-20 MG/5ML PO SUSP
30.0000 mL | ORAL | Status: DC | PRN
Start: 2021-09-08 — End: 2021-09-11

## 2021-09-08 MED ORDER — ONDANSETRON HCL 4 MG/2ML IJ SOLN
INTRAMUSCULAR | Status: DC | PRN
  Administered 2021-09-08: 4 mg via INTRAVENOUS

## 2021-09-08 MED ORDER — METHOCARBAMOL 500 MG IVPB - SIMPLE MED
500.0000 mg | Freq: Four times a day (QID) | INTRAVENOUS | Status: DC | PRN
  Administered 2021-09-08: 500 mg via INTRAVENOUS
  Filled 2021-09-08: qty 50

## 2021-09-08 MED ORDER — OXYCODONE HCL 5 MG PO TABS
5.0000 mg | ORAL_TABLET | ORAL | Status: DC | PRN
  Administered 2021-09-09 – 2021-09-10 (×2): 5 mg via ORAL
  Filled 2021-09-08 (×2): qty 1

## 2021-09-08 MED ORDER — NORTRIPTYLINE HCL 10 MG PO CAPS
10.0000 mg | ORAL_CAPSULE | Freq: Every day | ORAL | Status: DC
  Administered 2021-09-09 – 2021-09-10 (×2): 10 mg via ORAL
  Filled 2021-09-08 (×3): qty 1

## 2021-09-08 MED ORDER — KCL IN DEXTROSE-NACL 20-5-0.45 MEQ/L-%-% IV SOLN
INTRAVENOUS | Status: AC
  Filled 2021-09-08 (×2): qty 1000

## 2021-09-08 MED ORDER — SODIUM CHLORIDE 0.9 % IR SOLN
Status: DC | PRN
  Administered 2021-09-08: 1000 mL

## 2021-09-08 MED ORDER — HYDROXYZINE HCL 10 MG PO TABS
10.0000 mg | ORAL_TABLET | Freq: Every day | ORAL | Status: DC
  Filled 2021-09-08 (×4): qty 1

## 2021-09-08 MED ORDER — BUPIVACAINE LIPOSOME 1.3 % IJ SUSP
INTRAMUSCULAR | Status: AC
  Filled 2021-09-08: qty 20

## 2021-09-08 MED ORDER — CEFAZOLIN SODIUM-DEXTROSE 1-4 GM/50ML-% IV SOLN
1.0000 g | Freq: Four times a day (QID) | INTRAVENOUS | Status: AC
  Administered 2021-09-08 (×2): 1 g via INTRAVENOUS
  Filled 2021-09-08 (×2): qty 50

## 2021-09-08 MED ORDER — FENTANYL CITRATE PF 50 MCG/ML IJ SOSY
50.0000 ug | PREFILLED_SYRINGE | INTRAMUSCULAR | Status: DC
Start: 2021-09-08 — End: 2021-09-08
  Administered 2021-09-08: 50 ug via INTRAVENOUS
  Filled 2021-09-08: qty 2

## 2021-09-08 MED ORDER — PANTOPRAZOLE SODIUM 40 MG PO TBEC
40.0000 mg | DELAYED_RELEASE_TABLET | Freq: Every day | ORAL | Status: DC
  Administered 2021-09-08 – 2021-09-11 (×4): 40 mg via ORAL
  Filled 2021-09-08 (×4): qty 1

## 2021-09-08 MED ORDER — STERILE WATER FOR IRRIGATION IR SOLN
Status: DC | PRN
  Administered 2021-09-08: 2000 mL

## 2021-09-08 MED ORDER — ACETAMINOPHEN 160 MG/5ML PO SOLN: 325.0000 mg | Freq: Once | ORAL | Status: DC | PRN

## 2021-09-08 MED ORDER — VITAMIN D 25 MCG (1000 UNIT) PO TABS
1000.0000 [IU] | ORAL_TABLET | Freq: Every day | ORAL | Status: DC
  Administered 2021-09-09 – 2021-09-11 (×3): 1000 [IU] via ORAL
  Filled 2021-09-08 (×3): qty 1

## 2021-09-08 MED ORDER — POLYETHYLENE GLYCOL 3350 17 G PO PACK
17.0000 g | PACK | Freq: Every day | ORAL | Status: DC
  Administered 2021-09-09 – 2021-09-11 (×2): 17 g via ORAL
  Filled 2021-09-08 (×3): qty 1

## 2021-09-08 MED ORDER — LACTATED RINGERS IV SOLN: INTRAVENOUS | Status: DC

## 2021-09-08 MED ORDER — BUPIVACAINE LIPOSOME 1.3 % IJ SUSP
INTRAMUSCULAR | Status: DC | PRN
  Administered 2021-09-08: 20 mL

## 2021-09-08 MED ORDER — METOCLOPRAMIDE HCL 5 MG PO TABS: 5.0000 mg | ORAL_TABLET | Freq: Three times a day (TID) | ORAL | Status: DC | PRN

## 2021-09-08 MED ORDER — MIDAZOLAM HCL 2 MG/2ML IJ SOLN
1.0000 mg | INTRAMUSCULAR | Status: DC
  Filled 2021-09-08: qty 2

## 2021-09-08 MED ORDER — CHLORHEXIDINE GLUCONATE 0.12 % MT SOLN
15.0000 mL | Freq: Once | OROMUCOSAL | Status: AC
  Administered 2021-09-08: 15 mL via OROMUCOSAL

## 2021-09-08 MED ORDER — METOCLOPRAMIDE HCL 5 MG/ML IJ SOLN: 5.0000 mg | Freq: Three times a day (TID) | INTRAMUSCULAR | Status: DC | PRN

## 2021-09-08 MED ORDER — BISACODYL 5 MG PO TBEC: 5.0000 mg | DELAYED_RELEASE_TABLET | Freq: Every day | ORAL | Status: DC | PRN

## 2021-09-08 MED ORDER — 0.9 % SODIUM CHLORIDE (POUR BTL) OPTIME
TOPICAL | Status: DC | PRN
  Administered 2021-09-08: 1000 mL

## 2021-09-08 MED ORDER — SODIUM CHLORIDE 0.9% FLUSH
INTRAVENOUS | Status: DC | PRN
  Administered 2021-09-08: 40 mL

## 2021-09-08 MED ORDER — ASPIRIN EC 81 MG PO TBEC: 81.0000 mg | DELAYED_RELEASE_TABLET | Freq: Two times a day (BID) | ORAL | 1 refills | Status: DC

## 2021-09-08 MED ORDER — PHENYLEPHRINE HCL (PRESSORS) 10 MG/ML IV SOLN
INTRAVENOUS | Status: DC | PRN
  Administered 2021-09-08: 120 ug via INTRAVENOUS

## 2021-09-08 MED ORDER — LORAZEPAM 1 MG PO TABS
1.0000 mg | ORAL_TABLET | Freq: Two times a day (BID) | ORAL | Status: DC | PRN
  Administered 2021-09-08 – 2021-09-09 (×2): 1 mg via ORAL
  Filled 2021-09-08 (×2): qty 1

## 2021-09-08 MED ORDER — ACETAMINOPHEN 325 MG PO TABS: 325.0000 mg | ORAL_TABLET | Freq: Once | ORAL | Status: DC | PRN

## 2021-09-08 MED ORDER — BUPIVACAINE-EPINEPHRINE 0.25% -1:200000 IJ SOLN
INTRAMUSCULAR | Status: DC | PRN
  Administered 2021-09-08: 30 mL

## 2021-09-08 MED ORDER — PROMETHAZINE HCL 25 MG/ML IJ SOLN: 6.2500 mg | INTRAMUSCULAR | Status: DC | PRN

## 2021-09-08 MED ORDER — METHOCARBAMOL 500 MG IVPB - SIMPLE MED
INTRAVENOUS | Status: AC
  Filled 2021-09-08: qty 50

## 2021-09-08 MED ORDER — PHENOL 1.4 % MT LIQD: 1.0000 | OROMUCOSAL | Status: DC | PRN

## 2021-09-08 MED ORDER — PROPOFOL 500 MG/50ML IV EMUL
INTRAVENOUS | Status: DC | PRN
  Administered 2021-09-08: 100 ug/kg/min via INTRAVENOUS

## 2021-09-08 MED ORDER — ORAL CARE MOUTH RINSE: 15.0000 mL | Freq: Once | OROMUCOSAL | Status: AC

## 2021-09-08 MED ORDER — HYDROMORPHONE HCL 1 MG/ML IJ SOLN
0.5000 mg | INTRAMUSCULAR | Status: DC | PRN
  Administered 2021-09-08 – 2021-09-10 (×5): 1 mg via INTRAVENOUS
  Filled 2021-09-08 (×5): qty 1

## 2021-09-08 SURGICAL SUPPLY — 80 items
AGENT HMST SPONGE THK3/8 (HEMOSTASIS)
ATTUNE MED DOME PAT 38 KNEE (Knees) ×1 IMPLANT
ATTUNE PS FEM RT SZ 6 CEM KNEE (Femur) ×1 IMPLANT
ATTUNE PSRP INSR SZ6 7 KNEE (Insert) ×1 IMPLANT
BAG COUNTER SPONGE SURGICOUNT (BAG) ×1 IMPLANT
BAG DECANTER FOR FLEXI CONT (MISCELLANEOUS) ×2 IMPLANT
BAG SPEC THK2 15X12 ZIP CLS (MISCELLANEOUS) ×1
BAG SPNG CNTER NS LX DISP (BAG) ×1
BAG ZIPLOCK 12X15 (MISCELLANEOUS) ×1 IMPLANT
BASE TIBIA ATTUNE KNEE SYS SZ6 (Knees) IMPLANT
BLADE SAW SGTL 11.0X1.19X90.0M (BLADE) ×2 IMPLANT
BLADE SAW SGTL 13.0X1.19X90.0M (BLADE) ×2 IMPLANT
BLADE SURG SZ10 CARB STEEL (BLADE) ×4 IMPLANT
BNDG COHESIVE 4X5 TAN ST LF (GAUZE/BANDAGES/DRESSINGS) ×2 IMPLANT
BNDG ELASTIC 4X5.8 VLCR STR LF (GAUZE/BANDAGES/DRESSINGS) ×2 IMPLANT
BNDG ELASTIC 6X5.8 VLCR STR LF (GAUZE/BANDAGES/DRESSINGS) ×2 IMPLANT
BOWL SMART MIX CTS (DISPOSABLE) ×2 IMPLANT
BSPLAT TIB 6 CMNT ROT PLAT STR (Knees) ×1 IMPLANT
CEMENT HV SMART SET (Cement) ×4 IMPLANT
COVER SURGICAL LIGHT HANDLE (MISCELLANEOUS) ×2 IMPLANT
CUFF TOURN SGL QUICK 34 (TOURNIQUET CUFF) ×2
CUFF TRNQT CYL 34X4.125X (TOURNIQUET CUFF) ×1 IMPLANT
DECANTER SPIKE VIAL GLASS SM (MISCELLANEOUS) ×1 IMPLANT
DRAPE INCISE IOBAN 66X45 STRL (DRAPES) ×2 IMPLANT
DRAPE ORTHO SPLIT 77X108 STRL (DRAPES) ×4
DRAPE SHEET LG 3/4 BI-LAMINATE (DRAPES) ×4 IMPLANT
DRAPE SURG ORHT 6 SPLT 77X108 (DRAPES) ×2 IMPLANT
DRAPE U-SHAPE 47X51 STRL (DRAPES) ×2 IMPLANT
DRSG AQUACEL AG ADV 3.5X10 (GAUZE/BANDAGES/DRESSINGS) ×2 IMPLANT
DRSG TEGADERM 4X4.75 (GAUZE/BANDAGES/DRESSINGS) IMPLANT
DURAPREP 26ML APPLICATOR (WOUND CARE) ×2 IMPLANT
ELECT BLADE TIP CTD 4 INCH (ELECTRODE) ×2 IMPLANT
ELECT REM PT RETURN 15FT ADLT (MISCELLANEOUS) ×2 IMPLANT
EVACUATOR 1/8 PVC DRAIN (DRAIN) IMPLANT
GAUZE SPONGE 2X2 8PLY STRL LF (GAUZE/BANDAGES/DRESSINGS) IMPLANT
GLOVE SRG 8 PF TXTR STRL LF DI (GLOVE) ×1 IMPLANT
GLOVE SURG POLYISO LF SZ7.5 (GLOVE) ×4 IMPLANT
GLOVE SURG POLYISO LF SZ8 (GLOVE) ×4 IMPLANT
GLOVE SURG UNDER POLY LF SZ7.5 (GLOVE) ×2 IMPLANT
GLOVE SURG UNDER POLY LF SZ8 (GLOVE) ×2
GOWN STRL REUS W/TWL XL LVL3 (GOWN DISPOSABLE) ×4 IMPLANT
HANDPIECE INTERPULSE COAX TIP (DISPOSABLE) ×2
HEMOSTAT SPONGE AVITENE ULTRA (HEMOSTASIS) IMPLANT
HOLDER FOLEY CATH W/STRAP (MISCELLANEOUS) IMPLANT
IMMOBILIZER KNEE 20 (SOFTGOODS) ×2
IMMOBILIZER KNEE 20 THIGH 36 (SOFTGOODS) ×1 IMPLANT
JET LAVAGE IRRISEPT WOUND (IRRIGATION / IRRIGATOR) ×2
KIT TURNOVER KIT A (KITS) ×1 IMPLANT
LAVAGE JET IRRISEPT WOUND (IRRIGATION / IRRIGATOR) ×1 IMPLANT
MANIFOLD NEPTUNE II (INSTRUMENTS) ×2 IMPLANT
NDL SAFETY ECLIPSE 18X1.5 (NEEDLE) IMPLANT
NEEDLE HYPO 18GX1.5 SHARP (NEEDLE)
NS IRRIG 1000ML POUR BTL (IV SOLUTION) ×1 IMPLANT
PACK TOTAL KNEE CUSTOM (KITS) ×2 IMPLANT
PROTECTOR NERVE ULNAR (MISCELLANEOUS) ×2 IMPLANT
SAW OSC TIP CART 19.5X105X1.3 (SAW) ×2 IMPLANT
SEALER BIPOLAR AQUA 6.0 (INSTRUMENTS) ×2 IMPLANT
SET HNDPC FAN SPRY TIP SCT (DISPOSABLE) ×1 IMPLANT
SOLUTION PRONTOSAN WOUND 350ML (IRRIGATION / IRRIGATOR) ×1 IMPLANT
SPONGE GAUZE 2X2 STER 10/PKG (GAUZE/BANDAGES/DRESSINGS)
SPONGE SURGIFOAM ABS GEL 100 (HEMOSTASIS) IMPLANT
STAPLER VISISTAT (STAPLE) IMPLANT
STRIP CLOSURE SKIN 1/2X4 (GAUZE/BANDAGES/DRESSINGS) ×1 IMPLANT
SUT BONE WAX W31G (SUTURE) ×2 IMPLANT
SUT MNCRL AB 4-0 PS2 18 (SUTURE) ×1 IMPLANT
SUT STRATAFIX 0 PDS 27 VIOLET (SUTURE) ×2
SUT VIC AB 1 CT1 27 (SUTURE) ×4
SUT VIC AB 1 CT1 27XBRD ANTBC (SUTURE) ×2 IMPLANT
SUT VIC AB 1 CTX 36 (SUTURE)
SUT VIC AB 1 CTX36XBRD ANBCTR (SUTURE) IMPLANT
SUT VIC AB 2-0 CT1 27 (SUTURE) ×6
SUT VIC AB 2-0 CT1 TAPERPNT 27 (SUTURE) ×3 IMPLANT
SUTURE STRATFX 0 PDS 27 VIOLET (SUTURE) ×1 IMPLANT
SYR 3ML LL SCALE MARK (SYRINGE) IMPLANT
SYR 50ML LL SCALE MARK (SYRINGE) IMPLANT
TIBIA ATTUNE KNEE SYS BASE SZ6 (Knees) ×2 IMPLANT
TRAY FOLEY MTR SLVR 16FR STAT (SET/KITS/TRAYS/PACK) ×2 IMPLANT
WATER STERILE IRR 1000ML POUR (IV SOLUTION) ×2 IMPLANT
WIPE CHG CHLORHEXIDINE 2% (PERSONAL CARE ITEMS) ×2 IMPLANT
WRAP KNEE MAXI GEL POST OP (GAUZE/BANDAGES/DRESSINGS) ×2 IMPLANT

## 2021-09-08 NOTE — Care Plan (Signed)
Ortho Bundle Case Management Note  Patient Details  Name: JAMOL GINYARD Sr. MRN: [id number] Date of Birth: 22-May-1946                  R TKA on 09-08-21 DCP: Home with wife. 1 story home with ramp entrance. DME: RW and 3-in-1 ordered through [facility] 09-12-21 at 2:00 pm   DME Arranged:  3-N-1, Walker rolling DME Agency:  [employer]  HH Arranged:    [place] Agency:     Additional Comments: Please contact me with any questions of if this plan should need to change.  Marianne Sofia, RN,CCM [facility]  [phone] 09/08/2021, 11:49 AM

## 2021-09-08 NOTE — Progress Notes (Signed)
Assisted Dr. Hollis with right, ultrasound guided, adductor canal block. Side rails up, monitors on throughout procedure. See vital signs in flow sheet. Tolerated Procedure well.  

## 2021-09-08 NOTE — Interval H&P Note (Signed)
History and Physical Interval Note:  09/08/2021 1:09 PM  Erik Lolling Sr.  has presented today for surgery, with the diagnosis of Degenerative joint disease right knee.  The various methods of treatment have been discussed with the patient and family. After consideration of risks, benefits and other options for treatment, the patient has consented to  Procedure(s): TOTAL KNEE ARTHROPLASTY (Right) as a surgical intervention.  The patient's history has been reviewed, patient examined, no change in status, stable for surgery.  I have reviewed the patient's chart and labs.  Questions were answered to the patient's satisfaction.     [name]

## 2021-09-08 NOTE — Evaluation (Signed)
Physical Therapy Evaluation Patient Details Name: [name] Sr. MRN: [id number] DOB: 1946/04/01 Today's Date: 09/08/2021  History of Present Illness  Patient is 75 y.o. 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    Clinical Impression  [name] is a 75 y.o. male POD 0 s/p Rt TKA. Patient reports independence with mobility at baseline. Patient is now limited by functional impairments (see PT problem list below) and requires min assist for transfers and gait with RW. Patient was able to ambulate ~40 feet with RW and min assist. End of gait pt c/o slight weakness/dizziness that resolved in sitting. Patient instructed in exercise to facilitate circulation to manage edema and reduce risk of DVT. Patient will benefit from continued skilled PT interventions to address impairments and progress towards PLOF. Acute PT will follow to progress mobility and stair training in preparation for safe discharge home.        Recommendations for follow up therapy are one component of a multi-disciplinary discharge planning process, led by the attending physician.  Recommendations may be updated based on patient status, additional functional criteria and insurance authorization.  Follow Up Recommendations Follow physician's recommendations for discharge plan and follow up therapies    Assistance Recommended at Discharge Intermittent Supervision/Assistance  Functional Status Assessment Patient has had a recent decline in their functional status and demonstrates the ability to make significant improvements in function in a reasonable and predictable amount of time.  Equipment Recommendations  Rolling walker (2 wheels)    Recommendations for Other Services       Precautions / Restrictions Precautions Precautions: Fall Restrictions Weight Bearing Restrictions: No      Mobility  Bed Mobility Overal bed  mobility: Needs Assistance Bed Mobility: Supine to Sit     Supine to sit: Supervision;HOB elevated          Transfers Overall transfer level: Needs assistance Equipment used: Rolling walker (2 wheels) Transfers: Sit to/from Stand Sit to Stand: From elevated surface;Min guard           General transfer comment: pt moves quickly, close gaurd for safety and cues for hand placement on RW.    Ambulation/Gait Ambulation/Gait assistance: Min guard;Supervision Gait Distance (Feet): 40 Feet Assistive device: Rolling walker (2 wheels) Gait Pattern/deviations: Step-to pattern;Decreased stride length;Decreased weight shift to right Gait velocity: decr   General Gait Details: cues needed for safe step to pattern and position of RW. pt steady throughout and no overt LOB or knee buckling noted. pt c/o dizziness towards end of gait and chair provided for seat.  Stairs            Wheelchair Mobility    Modified Rankin (Stroke Patients Only)       Balance Overall balance assessment: Needs assistance Sitting-balance support: Feet supported Sitting balance-Leahy Scale: Good     Standing balance support: Reliant on assistive device for balance;During functional activity;Bilateral upper extremity supported Standing balance-Leahy Scale: Poor                               Pertinent Vitals/Pain Pain Assessment: 0-10 Pain Score: 5  Pain Location: Rt knee Pain Descriptors / Indicators: Aching;Discomfort Pain Intervention(s): Limited activity within patient's tolerance;Monitored during session;Repositioned;RN gave pain meds during session    [place] expects to be discharged to:: Private residence Living Arrangements: Spouse/significant other Available Help at Discharge: Family  Type of Home: House Home Access: Ramped entrance       Home Layout: One level Home Equipment: None      Prior Function Prior Level of Function :  Independent/Modified Independent                     Hand Dominance        Extremity/Trunk Assessment   Upper Extremity Assessment Upper Extremity Assessment: Overall WFL for tasks assessed    Lower Extremity Assessment Lower Extremity Assessment: Overall WFL for tasks assessed;RLE deficits/detail RLE Deficits / Details: good quad activation, no extensor lag with SLR RLE Sensation: WNL RLE Coordination: WNL    Cervical / Trunk Assessment Cervical / Trunk Assessment: Normal  Communication   Communication: No difficulties  Cognition Arousal/Alertness: Awake/alert Behavior During Therapy: WFL for tasks assessed/performed Overall Cognitive Status: Within Functional Limits for tasks assessed                                          General Comments      Exercises Total Joint Exercises Ankle Circles/Pumps: AROM;Both;20 reps;Seated   Assessment/Plan    PT Assessment Patient needs continued PT services  PT Problem List Decreased strength;Decreased range of motion;Decreased activity tolerance;Decreased balance;Decreased mobility;Decreased knowledge of use of DME;Decreased knowledge of precautions;Pain       PT Treatment Interventions DME instruction;Gait training;Stair training;Functional mobility training;Therapeutic activities;Therapeutic exercise;Balance training;Patient/family education    PT Goals (Current goals can be found in the Care Plan section)  Acute Rehab PT Goals Patient Stated Goal: recover independence PT Goal Formulation: With patient Time For Goal Achievement: 09/15/21 Potential to Achieve Goals: Good    Frequency 7X/week   Barriers to discharge        Co-evaluation               AM-PAC PT "6 Clicks" Mobility  Outcome Measure Help needed turning from your back to your side while in a flat bed without using bedrails?: A Little Help needed moving from lying on your back to sitting on the side of a flat bed without  using bedrails?: A Little Help needed moving to and from a bed to a chair (including a wheelchair)?: A Little Help needed standing up from a chair using your arms (e.g., wheelchair or bedside chair)?: A Little Help needed to walk in hospital room?: A Little Help needed climbing 3-5 steps with a railing? : A Little 6 Click Score: 18    End of Session Equipment Utilized During Treatment: Gait belt Activity Tolerance: Patient tolerated treatment well Patient left: in chair;with call bell/phone within reach;with chair alarm set;with family/visitor present Nurse Communication: Mobility status PT Visit Diagnosis: Muscle weakness (generalized) (M62.81);Difficulty in walking, not elsewhere classified (R26.2)    Time: 1750-1810 PT Time Calculation (min) (ACUTE ONLY): 20 min   Charges:   PT Evaluation $PT Eval Low Complexity: 1 Low          Verner Mould, DPT Acute Rehabilitation Services Office [phone] Pager [phone]   Jacques Navy 09/08/2021, 6:29 PM

## 2021-09-08 NOTE — Discharge Instructions (Addendum)
Elevate leg above heart 6x a day for 8minutes each Use knee immobilizer while walking until can SLR x 10 Use knee immobilizer in bed to keep knee in extension Aquacel dressing may remain in place until follow up. May shower with aquacel dressing in place. If the dressing becomes saturated or peels off, you may remove aquacel dressing. Do not remove steri-strips if they are present. Place new dressing with gauze and tape or ACE bandage which should be kept clean and dry and changed daily.   INSTRUCTIONS AFTER JOINT REPLACEMENT   Remove items at home which could result in a fall. This includes throw rugs or furniture in walking pathways ICE to the affected joint every three hours while awake for 30 minutes at a time, for at least the first 3-5 days, and then as needed for pain and swelling.  Continue to use ice for pain and swelling. You may notice swelling that will progress down to the foot and ankle.  This is normal after surgery.  Elevate your leg when you are not up walking on it.   Continue to use the breathing machine you got in the hospital (incentive spirometer) which will help keep your temperature down.  It is common for your temperature to cycle up and down following surgery, especially at night when you are not up moving around and exerting yourself.  The breathing machine keeps your lungs expanded and your temperature down.   DIET:  As you were doing prior to hospitalization, we recommend a well-balanced diet.  DRESSING / WOUND CARE / SHOWERING  Keep the surgical dressing until follow up.  The dressing is water proof, so you can shower without any extra covering.  IF THE DRESSING FALLS OFF or the wound gets wet inside, change the dressing with sterile gauze.  Please use good hand washing techniques before changing the dressing.  Do not use any lotions or creams on the incision until instructed by your surgeon.    ACTIVITY  Increase activity slowly as tolerated, but follow the weight  bearing instructions below.   No driving for 6 weeks or until further direction given by your physician.  You cannot drive while taking narcotics.  No lifting or carrying greater than 10 lbs. until further directed by your surgeon. Avoid periods of inactivity such as sitting longer than an hour when not asleep. This helps prevent blood clots.  You may return to work once you are authorized by your doctor.     WEIGHT BEARING   Weight bearing as tolerated with assist device (walker, cane, etc) as directed, use it as long as suggested by your surgeon or therapist, typically at least 4-6 weeks.   EXERCISES  Results after joint replacement surgery are often greatly improved when you follow the exercise, range of motion and muscle strengthening exercises prescribed by your doctor. Safety measures are also important to protect the joint from further injury. Any time any of these exercises cause you to have increased pain or swelling, decrease what you are doing until you are comfortable again and then slowly increase them. If you have problems or questions, call your caregiver or physical therapist for advice.   Rehabilitation is important following a joint replacement. After just a few days of immobilization, the muscles of the leg can become weakened and shrink (atrophy).  These exercises are designed to build up the tone and strength of the thigh and leg muscles and to improve motion. Often times heat used for twenty to thirty  minutes before working out will loosen up your tissues and help with improving the range of motion but do not use heat for the first two weeks following surgery (sometimes heat can increase post-operative swelling).   These exercises can be done on a training (exercise) mat, on the floor, on a table or on a bed. Use whatever works the best and is most comfortable for you.    Use music or television while you are exercising so that the exercises are a pleasant break in your day.  This will make your life better with the exercises acting as a break in your routine that you can look forward to.   Perform all exercises about fifteen times, three times per day or as directed.  You should exercise both the operative leg and the other leg as well.  Exercises include:   Quad Sets - Tighten up the muscle on the front of the thigh (Quad) and hold for 5-10 seconds.   Straight Leg Raises - With your knee straight (if you were given a brace, keep it on), lift the leg to 60 degrees, hold for 3 seconds, and slowly lower the leg.  Perform this exercise against resistance later as your leg gets stronger.  Leg Slides: Lying on your back, slowly slide your foot toward your buttocks, bending your knee up off the floor (only go as far as is comfortable). Then slowly slide your foot back down until your leg is flat on the floor again.  Angel Wings: Lying on your back spread your legs to the side as far apart as you can without causing discomfort.  Hamstring Strength:  Lying on your back, push your heel against the floor with your leg straight by tightening up the muscles of your buttocks.  Repeat, but this time bend your knee to a comfortable angle, and push your heel against the floor.  You may put a pillow under the heel to make it more comfortable if necessary.   A rehabilitation program following joint replacement surgery can speed recovery and prevent re-injury in the future due to weakened muscles. Contact your doctor or a physical therapist for more information on knee rehabilitation.    CONSTIPATION  Constipation is defined medically as fewer than three stools per week and severe constipation as less than one stool per week.  Even if you have a regular bowel pattern at home, your normal regimen is likely to be disrupted due to multiple reasons following surgery.  Combination of anesthesia, postoperative narcotics, change in appetite and fluid intake all can affect your bowels.   YOU MUST  use at least one of the following options; they are listed in order of increasing strength to get the job done.  They are all available over the counter, and you may need to use some, POSSIBLY even all of these options:    Drink plenty of fluids (prune juice may be helpful) and high fiber foods Colace 100 mg by mouth twice a day  Senokot for constipation as directed and as needed Dulcolax (bisacodyl), take with full glass of water  Miralax (polyethylene glycol) once or twice a day as needed.  If you have tried all these things and are unable to have a bowel movement in the first 3-4 days after surgery call either your surgeon or your primary doctor.    If you experience loose stools or diarrhea, hold the medications until you stool forms back up.  If your symptoms do not get better within  1 week or if they get worse, check with your doctor.  If you experience "the worst abdominal pain ever" or develop nausea or vomiting, please contact the office immediately for further recommendations for treatment.   ITCHING:  If you experience itching with your medications, try taking only a single pain pill, or even half a pain pill at a time.  You can also use Benadryl over the counter for itching or also to help with sleep.   TED HOSE STOCKINGS:  Use stockings on both legs until for at least 2 weeks or as directed by physician office. They may be removed at night for sleeping.  MEDICATIONS:  See your medication summary on the "After Visit Summary" that nursing will review with you.  You may have some home medications which will be placed on hold until you complete the course of blood thinner medication.  It is important for you to complete the blood thinner medication as prescribed.  PRECAUTIONS:  If you experience chest pain or shortness of breath - call 911 immediately for transfer to the hospital emergency department.   If you develop a fever greater that 101 F, purulent drainage from wound, increased  redness or drainage from wound, foul odor from the wound/dressing, or calf pain - CONTACT YOUR SURGEON.                                                   FOLLOW-UP APPOINTMENTS:  If you do not already have a post-op appointment, please call the office for an appointment to be seen by your surgeon.  Guidelines for how soon to be seen are listed in your "After Visit Summary", but are typically between 1-4 weeks after surgery.  OTHER INSTRUCTIONS:   Knee Replacement:  Do not place pillow under knee, focus on keeping the knee straight while resting. CPM instructions: 0-90 degrees, 2 hours in the morning, 2 hours in the afternoon, and 2 hours in the evening. Place foam block, curve side up under heel at all times except when in CPM or when walking.  DO NOT modify, tear, cut, or change the foam block in any way.  POST-OPERATIVE OPIOID TAPER INSTRUCTIONS: It is important to wean off of your opioid medication as soon as possible. If you do not need pain medication after your surgery it is ok to stop day one. Opioids include: Codeine, Hydrocodone(Norco, Vicodin), Oxycodone(Percocet, oxycontin) and hydromorphone amongst others.  Long term and even short term use of opiods can cause: Increased pain response Dependence Constipation Depression Respiratory depression And more.  Withdrawal symptoms can include Flu like symptoms Nausea, vomiting And more Techniques to manage these symptoms Hydrate well Eat regular healthy meals Stay active Use relaxation techniques(deep breathing, meditating, yoga) Do Not substitute Alcohol to help with tapering If you have been on opioids for less than two weeks and do not have pain than it is ok to stop all together.  Plan to wean off of opioids This plan should start within one week post op of your joint replacement. Maintain the same interval or time between taking each dose and first decrease the dose.  Cut the total daily intake of opioids by one tablet each  day Next start to increase the time between doses. The last dose that should be eliminated is the evening dose.   MAKE SURE YOU:  Understand  these instructions.  Get help right away if you are not doing well or get worse.    Thank you for letting us be a part of your medical care team.  It is a privilege we respect greatly.  We hope these instructions will help you stay on track for a fast and full recovery!     Information on my medicine - ELIQUIS (apixaban)  This medication education was reviewed with me or my healthcare representative as part of my discharge preparation.   Why was Eliquis prescribed for you? Eliquis was prescribed for you to reduce the risk of blood clots forming after orthopedic surgery.    What do You need to know about Eliquis? Take your Eliquis TWICE DAILY - one tablet in the morning and one tablet in the evening with or without food.  It would be best to take the dose about the same time each day.  If you have difficulty swallowing the tablet whole please discuss with your pharmacist how to take the medication safely.  Take Eliquis exactly as prescribed by your doctor and DO NOT stop taking Eliquis without talking to the doctor who prescribed the medication.  Stopping without other medication to take the place of Eliquis may increase your risk of developing a clot.  After discharge, you should have regular check-up appointments with your healthcare provider that is prescribing your Eliquis.  What do you do if you miss a dose? If a dose of ELIQUIS is not taken at the scheduled time, take it as soon as possible on the same day and twice-daily administration should be resumed.  The dose should not be doubled to make up for a missed dose.  Do not take more than one tablet of ELIQUIS at the same time.  Important Safety Information A possible side effect of Eliquis is bleeding. You should call your healthcare provider right away if you experience any of  the following: Bleeding from an injury or your nose that does not stop. Unusual colored urine (red or dark brown) or unusual colored stools (red or black). Unusual bruising for unknown reasons. A serious fall or if you hit your head (even if there is no bleeding).  Some medicines may interact with Eliquis and might increase your risk of bleeding or clotting while on Eliquis. To help avoid this, consult your healthcare provider or pharmacist prior to using any new prescription or non-prescription medications, including herbals, vitamins, non-steroidal anti-inflammatory drugs (NSAIDs) and supplements.  This website has more information on Eliquis (apixaban): http://www.eliquis.com/eliquis/home

## 2021-09-08 NOTE — Brief Op Note (Signed)
09/08/2021  3:51 PM  PATIENT:  [name]  75 y.o. male  PRE-OPERATIVE DIAGNOSIS:  Degenerative joint disease right knee  POST-OPERATIVE DIAGNOSIS:  Degenerative joint disease right knee  PROCEDURE:  Procedure(s): TOTAL KNEE ARTHROPLASTY (Right)  SURGEON:  Surgeon(s) and Role:    Susa Day, MD - Primary  PHYSICIAN ASSISTANT:   ASSISTANTS: Bissell   ANESTHESIA:   spinal  EBL:  50 mL   BLOOD ADMINISTERED:none  DRAINS: none   LOCAL MEDICATIONS USED:  MARCAINE     SPECIMEN:  No Specimen  DISPOSITION OF SPECIMEN:  N/A  COUNTS:  YES  TOURNIQUET:   Total Tourniquet Time Documented: Thigh (Right) - 66 minutes Total: Thigh (Right) - 66 minutes   DICTATION: .Other Dictation: Dictation Number   [id number]  PLAN OF CARE: Admit for overnight observation  PATIENT DISPOSITION:  PACU - hemodynamically stable.   Delay start of Pharmacological VTE agent (>24hrs) due to surgical blood loss or risk of bleeding: no

## 2021-09-08 NOTE — Anesthesia Postprocedure Evaluation (Signed)
Anesthesia Post Note  Patient: Erik Lolling Sr.  Procedure(s) Performed: TOTAL KNEE ARTHROPLASTY (Right: Knee)     Patient location during evaluation: PACU Anesthesia Type: Spinal Level of consciousness: oriented and awake and alert Pain management: pain level controlled Vital Signs Assessment: post-procedure vital signs reviewed and stable Respiratory status: spontaneous breathing, respiratory function stable and patient connected to nasal cannula oxygen Cardiovascular status: blood pressure returned to baseline and stable Postop Assessment: no headache, no backache, no apparent nausea or vomiting and spinal receding Anesthetic complications: no   No notable events documented.  Last Vitals:  Vitals:   09/08/21 1715 09/08/21 1731  BP:  (!) 141/77  Pulse: (!) 57 (!) 58  Resp: 11   Temp:  36.5 C  SpO2: 98% 100%    Last Pain:  Vitals:   09/08/21 1731  TempSrc: Oral  PainSc:                  Effie Berkshire

## 2021-09-08 NOTE — Transfer of Care (Signed)
Immediate Anesthesia Transfer of Care Note  Patient: Erik Lolling Sr.  Procedure(s) Performed: TOTAL KNEE ARTHROPLASTY (Right: Knee)  Patient Location: PACU  Anesthesia Type:MAC and Spinal  Level of Consciousness: awake, alert  and oriented  Airway & Oxygen Therapy: Patient Spontanous Breathing and Patient connected to face mask  Post-op Assessment: Report given to RN and Post -op Vital signs reviewed and stable  Post vital signs: Reviewed and stable  Last Vitals:  Vitals Value Taken Time  BP 116/72 09/08/21 1608  Temp    Pulse 75 09/08/21 1615  Resp 18 09/08/21 1615  SpO2 96 % 09/08/21 1615    Last Pain:  Vitals:   09/08/21 1310  TempSrc:   PainSc: 0-No pain      Patients Stated Pain Goal: 3 (83/09/40 7680)  Complications: No notable events documented.

## 2021-09-08 NOTE — Anesthesia Procedure Notes (Signed)
Anesthesia Regional Block: Adductor canal block   Pre-Anesthetic Checklist: , timeout performed,  Correct Patient, Correct Site, Correct Laterality,  Correct Procedure, Correct Position, site marked,  Risks and benefits discussed,  Surgical consent,  Pre-op evaluation,  At surgeon's request and post-op pain management  Laterality: Right  Prep: chloraprep       Needles:  Injection technique: Single-shot  Needle Type: Echogenic Stimulator Needle     Needle Length: 9cm  Needle Gauge: 21     Additional Needles:   Procedures:,,,, ultrasound used (permanent image in chart),,    Narrative:  Start time: 09/08/2021 12:25 PM End time: 09/08/2021 12:30 PM Injection made incrementally with aspirations every 5 mL.  Performed by: Personally  Anesthesiologist: Effie Berkshire, MD  Additional Notes: Patient tolerated the procedure well. Local anesthetic introduced in an incremental fashion under minimal resistance after negative aspirations. No paresthesias were elicited. After completion of the procedure, no acute issues were identified and patient continued to be monitored by RN.

## 2021-09-09 ENCOUNTER — Other Ambulatory Visit: Payer: Self-pay

## 2021-09-09 DIAGNOSIS — R3915 Urgency of urination: Secondary | ICD-10-CM | POA: Diagnosis not present

## 2021-09-09 DIAGNOSIS — R350 Frequency of micturition: Secondary | ICD-10-CM | POA: Diagnosis not present

## 2021-09-09 LAB — CBC
HCT: 40.5 % (ref 39.0–52.0)
Hemoglobin: 13.5 g/dL (ref 13.0–17.0)
MCH: 29.8 pg (ref 26.0–34.0)
MCHC: 33.3 g/dL (ref 30.0–36.0)
MCV: 89.4 fL (ref 80.0–100.0)
Platelets: 344 10*3/uL (ref 150–400)
RBC: 4.53 MIL/uL (ref 4.22–5.81)
RDW: 12.6 % (ref 11.5–15.5)
WBC: 17.2 10*3/uL — ABNORMAL HIGH (ref 4.0–10.5)
nRBC: 0 % (ref 0.0–0.2)

## 2021-09-09 LAB — BASIC METABOLIC PANEL
Anion gap: 10 (ref 5–15)
BUN: 15 mg/dL (ref 8–23)
CO2: 21 mmol/L — ABNORMAL LOW (ref 22–32)
Calcium: 8.5 mg/dL — ABNORMAL LOW (ref 8.9–10.3)
Chloride: 102 mmol/L (ref 98–111)
Creatinine, Ser: 1.22 mg/dL (ref 0.61–1.24)
GFR, Estimated: >60 mL/min (ref >60)
Glucose, Bld: 158 mg/dL — ABNORMAL HIGH (ref 70–99)
Potassium: 4.3 mmol/L (ref 3.5–5.1)
Sodium: 133 mmol/L — ABNORMAL LOW (ref 135–145)

## 2021-09-09 NOTE — TOC Transition Note (Signed)
Transition of Care [facility]) - CM/SW Discharge Note  Patient Details  Name: Erik CHIOU Sr. MRN: [id number] Date of Birth: 1946/01/31  Transition of Care [facility]) CM/SW Contact:  Sherie Don, LCSW Phone Number: 09/09/2021, 12:44 PM  Clinical Narrative: Patient is expected to discharge home after working with PT. CSW met with patient to review discharge plan and needs. Patient will go to OPPT at [facility] with the first appointment scheduled for 09/12/21. Patient will need a rolling walker and 3N1. CSW made DME referral to [facility] with [employer]. [employer] to deliver DME to patient's room. TOC signing off.  Final next level of care: OP Rehab Barriers to Discharge: No Barriers Identified  Patient Goals and CMS Choice Patient states their goals for this hospitalization and ongoing recovery are:: Discharge home with [place] CMS Medicare.gov Compare Post Acute Care list provided to:: Patient Choice offered to / list presented to : Patient  Discharge Plan and Services        DME Arranged: 3-N-1, Walker rolling DME Agency: [employer] Date DME Agency Contacted: 09/09/21 Time DME Agency Contacted: 1211 Representative spoke with at DME Agency: [place]  Readmission Risk Interventions No flowsheet data found.

## 2021-09-09 NOTE — Plan of Care (Signed)
  Problem: Pain Management: Goal: Pain level will decrease with appropriate interventions Outcome: Progressing   

## 2021-09-09 NOTE — Progress Notes (Signed)
Physical Therapy Treatment Patient Details Name: Erik EYER Sr. MRN: [id number] DOB: 19-Oct-1946 Today's Date: 09/09/2021   History of Present Illness Patient is 75 y.o. male s/p Rt TKA on 09/08/21 with PMH significant for anemia, anxiety, depression, fibromyalgia, urostomy, GERD, HLD, HTN, osteoporosis, RA.    PT Comments    Pt with increased pain that limited today.  He also had difficulty  with terminal knee ext - educated and positioned with leg straight in low load stretch.  Will continue to benefit from therapy prior to return home.    Recommendations for follow up therapy are one component of a multi-disciplinary discharge planning process, led by the attending physician.  Recommendations may be updated based on patient status, additional functional criteria and insurance authorization.  Follow Up Recommendations  Follow physician's recommendations for discharge plan and follow up therapies     Assistance Recommended at Discharge Intermittent Supervision/Assistance  Equipment Recommendations  Rolling walker (2 wheels)    Recommendations for Other Services       Precautions / Restrictions Precautions Precautions: Fall Precaution Comments: urostomy bag Restrictions RLE Weight Bearing: Weight bearing as tolerated     Mobility  Bed Mobility               General bed mobility comments: in chair    Transfers Overall transfer level: Needs assistance Equipment used: Rolling walker (2 wheels) Transfers: Sit to/from Stand Sit to Stand: Min assist           General transfer comment: Cues for hand placement and R LE management; min A to rise and steady    Ambulation/Gait Ambulation/Gait assistance: Min guard Gait Distance (Feet): 16 Feet Assistive device: Rolling walker (2 wheels) Gait Pattern/deviations: Step-to pattern;Decreased weight shift to right Gait velocity: decr   General Gait Details: Step to R with limited weight on R LE due to pain, also  difficulty with terminal knee ext.  Cues for sequence and small steps for pain control.  Pt with some dizziness that limited distance (had received dilaudid prior to session)   Stairs             Wheelchair Mobility    Modified Rankin (Stroke Patients Only)       Balance Overall balance assessment: Needs assistance Sitting-balance support: Feet supported Sitting balance-Leahy Scale: Good     Standing balance support: Bilateral upper extremity supported Standing balance-Leahy Scale: Poor Standing balance comment: requiring RW at all times                            Cognition Arousal/Alertness: Awake/alert Behavior During Therapy: WFL for tasks assessed/performed Overall Cognitive Status: Within Functional Limits for tasks assessed                                          Exercises Total Joint Exercises Ankle Circles/Pumps: AROM;Both;20 reps;Seated Quad Sets: AROM;Both;10 reps;Supine Long Arc Quad: AAROM;Right;5 reps;Seated Knee Flexion: AAROM;Right;5 reps;Seated Goniometric ROM: R knee 15 to 85 degrees Other Exercises Other Exercises: ues for exercise techniques and limited reps/ROM due to pain    General Comments General comments (skin integrity, edema, etc.): Educated on importance of resting with leg striaght.  Pt lacking at least 15 degrees knee ext today.  Positioned with pillow under lower leg for long duration low load stretch at end of session.  Pertinent Vitals/Pain Pain Assessment: 0-10 Pain Score: 6  Pain Location: Rt knee Pain Descriptors / Indicators: Aching;Discomfort Pain Intervention(s): Limited activity within patient's tolerance;Monitored during session;Premedicated before session;Ice applied    Home Living                          Prior Function            PT Goals (current goals can now be found in the care plan section) Progress towards PT goals: Progressing toward goals     Frequency    7X/week      PT Plan Current plan remains appropriate    Co-evaluation              AM-PAC PT "6 Clicks" Mobility   Outcome Measure  Help needed turning from your back to your side while in a flat bed without using bedrails?: A Little Help needed moving from lying on your back to sitting on the side of a flat bed without using bedrails?: A Little Help needed moving to and from a bed to a chair (including a wheelchair)?: A Little Help needed standing up from a chair using your arms (e.g., wheelchair or bedside chair)?: A Little Help needed to walk in hospital room?: A Little Help needed climbing 3-5 steps with a railing? : A Lot 6 Click Score: 17    End of Session Equipment Utilized During Treatment: Gait belt (positioned high) Activity Tolerance: Patient tolerated treatment well Patient left: in chair;with call bell/phone within reach;with chair alarm set;with family/visitor present Nurse Communication: Mobility status PT Visit Diagnosis: Muscle weakness (generalized) (M62.81);Difficulty in walking, not elsewhere classified (R26.2)     Time: 0623-7628 PT Time Calculation (min) (ACUTE ONLY): 23 min  Charges:  $Gait Training: 8-22 mins $Therapeutic Exercise: 8-22 mins                     Abran Richard, PT Acute Rehab Services Pager [phone] [facility] [place] 09/09/2021, 10:08 AM

## 2021-09-09 NOTE — Progress Notes (Signed)
Orthopedics Progress Note  Subjective: Right knee is hurting a lot this morning and he complained of ostomy bag coming loose last night x3  Objective:  Vitals:   09/09/21 0303 09/09/21 0651  BP: (!) 169/79 (!) 174/81  Pulse: 76 79  Resp: 16 16  Temp: 98.6 F (37 C) 98.3 F (36.8 C)  SpO2: 94% 94%    General: Awake and alert  Musculoskeletal: Right knee dressing CDI. No pain with AROM of the ankle, severe pain with AROM of the knee Neurovascularly intact  Lab Results  Component Value Date   WBC 17.2 (H) 09/09/2021   HGB 13.5 09/09/2021   HCT 40.5 09/09/2021   MCV 89.4 09/09/2021   PLT 344 09/09/2021       Component Value Date/Time   NA 133 (L) 09/09/2021 0300   K 4.3 09/09/2021 0300   CL 102 09/09/2021 0300   CO2 21 (L) 09/09/2021 0300   GLUCOSE 158 (H) 09/09/2021 0300   BUN 15 09/09/2021 0300   CREATININE 1.22 09/09/2021 0300   CALCIUM 8.5 (L) 09/09/2021 0300   GFRNONAA >60 09/09/2021 0300   GFRAA 57 (L) 03/26/2017 1506    No results found for: INR, PROTIME  Assessment/Plan: POD #1 s/p Procedure(s): TOTAL KNEE ARTHROPLASTY PT,OT mobilization, DVT prophylaxis Will have outpatient PT(bundle), home equipment ordered already Likely discharge tomorrow  Doran Heater. Veverly Fells, MD 09/09/2021 7:58 AM

## 2021-09-09 NOTE — Op Note (Signed)
NAMEZEBEDIAH, [name] MEDICAL RECORD NO: [id number] ACCOUNT NO: [id number] DATE OF BIRTH: 1946-02-20 FACILITY: [facility] LOCATION: [facility] PHYSICIAN: [name], MD  Operative Report   DATE OF PROCEDURE: 09/08/2021  PREOPERATIVE DIAGNOSIS:  End-stage osteoarthrosis of the right knee.  POSTOPERATIVE DIAGNOSIS:  End-stage osteoarthrosis of the right knee.  PROCEDURE PERFORMED:  Right total knee arthroplasty.  ANESTHESIA:  Spinal.  ASSISTANT:  Lacie Draft, PA.  HISTORY:  A 75 year old with end-stage osteoarthrosis, medial compartment of the right knee, varus deformity, patellofemoral joint.  Indicated for replacement of the degenerated joint, failing conservative treatment.  Risks and benefits were discussed  including bleeding, infection, damage to neurovascular structures, no change in symptoms, worsening symptoms, DVT, PE, anesthetic complication, etc.  DESCRIPTION OF PROCEDURE:  With the patient in supine position, after induction of adequate general anesthesia, 1 gram of Kefzol, the right lower extremity was prepped and draped and exsanguinated in the usual sterile fashion.  Thigh tourniquet inflated  to 225 mmHg.  Midline incision was then made over the patella.  Full thickness flaps were developed.  Median parapatellar arthrotomy was performed.  Patella was everted and knee flexed.  Tricompartmental osteoarthrosis was noted, particularly medial  compartment bone-on-bone.  Osteophytes were removed with a rongeur.  Remnants of the ACL, medial and lateral menisci were removed.  Fat pad was debrided.  I used the Leksell to place the notch just above the femoral notch, which was the insertion of the  PCL.  We then used the step drill to enter the femoral canal.  It was very hard bone sclerotic.  I widened the space in line with the axis of the femur.  I then entered the femoral canal, irrigated.  I used a T-handle reamer, which was somewhat tight.  I  enlarged the reaming of the aperture  above the notch.  Then, I used a fin intramedullary guide, which was then placed satisfactorily.  The AP and lateral coplanar with the axis of the femur.  Introduced an intramedullary guide, 5-degree right 9 off the  distal femur.  This was pinned and I performed the cut. I then sized off the distal femur off the anterior cortex to a 6.  This was pinned in 3 degrees of external rotation.  I put a distal femoral cutting block.  I performed an anterior, posterior and  chamfer cuts with soft tissues protected at all times with a Crego posteriorly.  No notching of the femur occurred.  Following this, I subluxed the tibia. Again any remnants of the menisci were then removed.  PCL was recessed.  Osteophytes were removed  with a rongeur medially.  Soft tissue had been elevated medially, preserving the MCL.  Osteophytes were removed medially.  This was sized to a 6 just in the medial aspect of tibial tubercle.  This was then pinned.  I harvested bone centrally to place in  the distal femur.  It was drilled centrally and a punch fin guide was utilized.  This seated without difficulty and then turned attention back to the femur.  Box cut was utilized for the femur 6, just bisecting the canal and the condyles.  This was then  pinned.  I then used an oscillating saw and performed the box cut.  The residual bone removed.  PCL recessed.  Box was rasped.  I then placed the trial femur, impacted on the distal femur, drilled our lug holes.  This sat flush.  I then placed a 6 insert  and reduced the  knee and I had slight hyperextension and good stability.  Slight anterior drawer.  Everted the patella, measured to a 25, planed to 15 with a patellar guide.  I then again planed the patella, I used the patellar paddle parallel to the  shaft just lateralizing it somewhat, drilled our holes, then placed a trial patella and reduced it and had excellent patellofemoral tracking.  I then removed all trials.  I checked posteriorly, any  remnants of the menisci were removed.  Popliteus was  intact as was the capsule.  I cauterized the geniculates with Aquamantys.  I then copiously irrigated with pulsatile lavage, flexed the knee, subluxed the tibial surfaces, thoroughly dried, mixed cement on the back table under vacuum.  I placed cement in  the tibia, digitally pressurizing it, cemented and impacted the tibial component.  I cemented and impacted the femur.  Cement was on both the bone and the components.  I placed the 6 insert, reduced it, held in axial load throughout the curing of the  cement.  Redundant cement removed. I cemented and clamped the patella as well.  Marcaine with epinephrine was placed in the joint during the curing of the cement as was Prontosan irrigant.  Following the curing of the cement, tourniquet was released at  65 minutes.  Any mild bleeding was cauterized, slight hyperextension.  I tried a 7, which gave me a neutral full extension and good stability to varus valgus stressing at 0 and 30 degrees, negative anterior drawer.  This was then removed and the  redundant cement was then meticulously removed, copiously irrigated with pulsatile lavage and then the Prontosan.  I then flexed the knee and inserted the 7 permanent polyethylene insert.  This was reduced and I had again full flexion and good stability  to varus valgus stressing at 0 and 30 degrees, negative anterior drawer.  I then flexed the knee slightly 30 degrees and reapproximated the patellar arthrotomy with #1 Vicryl in interrupted figure-of-eight sutures.  This was oversewn with a running  Stratafix.  Following this, I had excellent patellofemoral tracking, negative anterior drawer and excellent stability.  Copiously irrigated the subcutaneous tissue, which was then closed with 2-0 and skin with staples.  Wound was dressed sterilely.   Patient was awoken without difficulty and transported to the recovery room in satisfactory condition.   The patient  tolerated the procedure well.  No complications.  PA assistant was utilized throughout the case to hold the retractors, exposure, helping with closure and positioning of the leg.   PAA D: 09/08/2021 3:59:38 pm T: 09/09/2021 2:20:00 am  JOB: [id number]/ [id number]

## 2021-09-09 NOTE — Plan of Care (Signed)
  Problem: Pain Managment: Goal: General experience of comfort will improve 09/09/2021 0046 by Blase Mess, RN Outcome: Progressing 09/09/2021 0044 by Blase Mess, RN Outcome: Progressing

## 2021-09-09 NOTE — Progress Notes (Signed)
Physical Therapy Treatment Patient Details Name: [name] Sr. MRN: [id number] DOB: 08/06/1946 Today's Date: 09/09/2021   History of Present Illness Patient is 75 y.o. male s/p Rt TKA on 09/08/21 with PMH significant for anemia, anxiety, depression, fibromyalgia, urostomy, GERD, HLD, HTN, osteoporosis, RA.    PT Comments    Pt making gradual progress but still limited by pain and c/o weakness in standing.  He did have slight drop in BP (155/78 sitting and 137/80 standing) but also reports has not been able to eat today and taking pain meds - encouraged eating and drinking.  Pt ambulated 20' with min A.  Needs further therapy prior to return home.     Recommendations for follow up therapy are one component of a multi-disciplinary discharge planning process, led by the attending physician.  Recommendations may be updated based on patient status, additional functional criteria and insurance authorization.  Follow Up Recommendations  Follow physician's recommendations for discharge plan and follow up therapies     Assistance Recommended at Discharge Intermittent Supervision/Assistance  Equipment Recommendations  Rolling walker (2 wheels)    Recommendations for Other Services       Precautions / Restrictions Precautions Precautions: Fall Precaution Comments: urostomy bag Restrictions RLE Weight Bearing: Weight bearing as tolerated     Mobility  Bed Mobility Overal bed mobility: Needs Assistance Bed Mobility: Supine to Sit;Sit to Supine     Supine to sit: Min assist Sit to supine: Min assist   General bed mobility comments: min A for  R LE    Transfers Overall transfer level: Needs assistance Equipment used: Rolling walker (2 wheels) Transfers: Sit to/from Stand Sit to Stand: Min assist           General transfer comment: Cues for hand placement and R LE management; min A to rise and steady; performed x 3    Ambulation/Gait Ambulation/Gait assistance: Min  guard Gait Distance (Feet): 20 Feet Assistive device: Rolling walker (2 wheels) Gait Pattern/deviations: Step-to pattern;Decreased weight shift to right Gait velocity: decr   General Gait Details: Step to R with limited weight on R LE due to pain, also difficulty with terminal knee ext.  Cues for sequence and small steps for pain control. Pt with c/o lightheadedness and weakness that limited (obtained BP, see belwo)   Stairs             Wheelchair Mobility    Modified Rankin (Stroke Patients Only)       Balance Overall balance assessment: Needs assistance Sitting-balance support: Feet supported Sitting balance-Leahy Scale: Good     Standing balance support: Bilateral upper extremity supported Standing balance-Leahy Scale: Poor Standing balance comment: requiring RW at all times                            Cognition Arousal/Alertness: Awake/alert Behavior During Therapy: WFL for tasks assessed/performed Overall Cognitive Status: Within Functional Limits for tasks assessed                                          Exercises Total Joint Exercises Ankle Circles/Pumps: AROM;Both;20 reps;Seated Quad Sets: AROM;Both;10 reps;Supine Towel Squeeze: AROM;Both;10 reps;Supine Heel Slides: AAROM;Right;10 reps;Supine (educated on gait belt use for AAROM) Hip ABduction/ADduction: AAROM;Right;10 reps;Supine (educated on gait belt use for AAROM) Long Arc Quad: AAROM;Right;Seated;10 reps Knee Flexion: AAROM;Right;Seated;10 reps Goniometric ROM: R knee  10 to 85 degrees    General Comments General comments (skin integrity, edema, etc.): BP sitting post walk 155/78; BP standing 137/80; also reports has not ate much today and received IV meds      Pertinent Vitals/Pain Pain Assessment: 0-10 Pain Score: 5  Pain Location: Rt knee Pain Descriptors / Indicators: Aching;Discomfort Pain Intervention(s): Limited activity within patient's tolerance;Monitored  during session;Premedicated before session;Ice applied    Home Living                          Prior Function            PT Goals (current goals can now be found in the care plan section) Progress towards PT goals: Progressing toward goals    Frequency    7X/week      PT Plan Current plan remains appropriate    Co-evaluation              AM-PAC PT "6 Clicks" Mobility   Outcome Measure  Help needed turning from your back to your side while in a flat bed without using bedrails?: A Little Help needed moving from lying on your back to sitting on the side of a flat bed without using bedrails?: A Little Help needed moving to and from a bed to a chair (including a wheelchair)?: A Little Help needed standing up from a chair using your arms (e.g., wheelchair or bedside chair)?: A Little Help needed to walk in hospital room?: A Little Help needed climbing 3-5 steps with a railing? : A Lot 6 Click Score: 17    End of Session Equipment Utilized During Treatment: Gait belt Activity Tolerance: Patient tolerated treatment well Patient left: with call bell/phone within reach;with family/visitor present;in bed;with bed alarm set;with SCD's reapplied Nurse Communication: Mobility status PT Visit Diagnosis: Muscle weakness (generalized) (M62.81);Difficulty in walking, not elsewhere classified (R26.2)     Time: 2010-0712 PT Time Calculation (min) (ACUTE ONLY): 26 min  Charges:  $Gait Training: 8-22 mins $Therapeutic Exercise: 8-22 mins                     Abran Richard, PT Acute Rehab Services Pager [phone] [facility] [place] 09/09/2021, 3:29 PM

## 2021-09-10 DIAGNOSIS — M179 Osteoarthritis of knee, unspecified: Secondary | ICD-10-CM | POA: Diagnosis present

## 2021-09-10 DIAGNOSIS — M1711 Unilateral primary osteoarthritis, right knee: Secondary | ICD-10-CM | POA: Diagnosis present

## 2021-09-10 DIAGNOSIS — Z8261 Family history of arthritis: Secondary | ICD-10-CM | POA: Diagnosis not present

## 2021-09-10 DIAGNOSIS — Z87891 Personal history of nicotine dependence: Secondary | ICD-10-CM | POA: Diagnosis not present

## 2021-09-10 DIAGNOSIS — M069 Rheumatoid arthritis, unspecified: Secondary | ICD-10-CM | POA: Diagnosis present

## 2021-09-10 DIAGNOSIS — K5790 Diverticulosis of intestine, part unspecified, without perforation or abscess without bleeding: Secondary | ICD-10-CM | POA: Diagnosis present

## 2021-09-10 DIAGNOSIS — M25761 Osteophyte, right knee: Secondary | ICD-10-CM | POA: Diagnosis present

## 2021-09-10 DIAGNOSIS — K219 Gastro-esophageal reflux disease without esophagitis: Secondary | ICD-10-CM | POA: Diagnosis present

## 2021-09-10 DIAGNOSIS — K449 Diaphragmatic hernia without obstruction or gangrene: Secondary | ICD-10-CM | POA: Diagnosis present

## 2021-09-10 DIAGNOSIS — Z8249 Family history of ischemic heart disease and other diseases of the circulatory system: Secondary | ICD-10-CM | POA: Diagnosis not present

## 2021-09-10 DIAGNOSIS — I2510 Atherosclerotic heart disease of native coronary artery without angina pectoris: Secondary | ICD-10-CM | POA: Diagnosis present

## 2021-09-10 DIAGNOSIS — M797 Fibromyalgia: Secondary | ICD-10-CM | POA: Diagnosis present

## 2021-09-10 DIAGNOSIS — Z885 Allergy status to narcotic agent status: Secondary | ICD-10-CM | POA: Diagnosis not present

## 2021-09-10 DIAGNOSIS — M810 Age-related osteoporosis without current pathological fracture: Secondary | ICD-10-CM | POA: Diagnosis present

## 2021-09-10 DIAGNOSIS — Z9852 Vasectomy status: Secondary | ICD-10-CM | POA: Diagnosis not present

## 2021-09-10 DIAGNOSIS — Z9079 Acquired absence of other genital organ(s): Secondary | ICD-10-CM | POA: Diagnosis not present

## 2021-09-10 DIAGNOSIS — M21161 Varus deformity, not elsewhere classified, right knee: Secondary | ICD-10-CM | POA: Diagnosis present

## 2021-09-10 DIAGNOSIS — Z20822 Contact with and (suspected) exposure to covid-19: Secondary | ICD-10-CM | POA: Diagnosis present

## 2021-09-10 DIAGNOSIS — Z887 Allergy status to serum and vaccine status: Secondary | ICD-10-CM | POA: Diagnosis not present

## 2021-09-10 DIAGNOSIS — Z888 Allergy status to other drugs, medicaments and biological substances status: Secondary | ICD-10-CM | POA: Diagnosis not present

## 2021-09-10 DIAGNOSIS — E785 Hyperlipidemia, unspecified: Secondary | ICD-10-CM | POA: Diagnosis present

## 2021-09-10 LAB — CBC
HCT: 39.8 % (ref 39.0–52.0)
Hemoglobin: 13.4 g/dL (ref 13.0–17.0)
MCH: 29.9 pg (ref 26.0–34.0)
MCHC: 33.7 g/dL (ref 30.0–36.0)
MCV: 88.8 fL (ref 80.0–100.0)
Platelets: 349 10*3/uL (ref 150–400)
RBC: 4.48 MIL/uL (ref 4.22–5.81)
RDW: 12.8 % (ref 11.5–15.5)
WBC: 16.7 10*3/uL — ABNORMAL HIGH (ref 4.0–10.5)
nRBC: 0 % (ref 0.0–0.2)

## 2021-09-10 MED ORDER — APIXABAN 2.5 MG PO TABS: 2.5000 mg | ORAL_TABLET | Freq: Two times a day (BID) | ORAL | 1 refills | Status: DC

## 2021-09-10 MED ORDER — APIXABAN 2.5 MG PO TABS: 2.5000 mg | ORAL_TABLET | Freq: Two times a day (BID) | ORAL | Status: DC

## 2021-09-10 MED ORDER — SODIUM CHLORIDE 0.9 % IV BOLUS: 500.0000 mL | Freq: Once | INTRAVENOUS | Status: DC

## 2021-09-10 MED ORDER — SODIUM CHLORIDE 0.9 % IV BOLUS
500.0000 mL | Freq: Once | INTRAVENOUS | Status: AC
  Administered 2021-09-10: 500 mL via INTRAVENOUS

## 2021-09-10 MED ORDER — APIXABAN 2.5 MG PO TABS
2.5000 mg | ORAL_TABLET | Freq: Two times a day (BID) | ORAL | Status: DC
  Administered 2021-09-10 – 2021-09-11 (×3): 2.5 mg via ORAL
  Filled 2021-09-10 (×3): qty 1

## 2021-09-10 NOTE — Progress Notes (Signed)
Physical Therapy Treatment Patient Details Name: [name] Sr. MRN: [id number] DOB: Jul 24, 1946 Today's Date: 09/10/2021   History of Present Illness Patient is 75 y.o. male s/p Rt TKA on 09/08/21 with PMH significant for anemia, anxiety, depression, fibromyalgia, urostomy, GERD, HLD, HTN, osteoporosis, RA.    PT Comments    Pt very cooperative but ltd this am by orthostatic hypotension with attempts at OOB activity.  BP supine 147/73; sitting 135/78; and standing 116/74. Pt reports increasing dizziness - RN and physician aware.    Recommendations for follow up therapy are one component of a multi-disciplinary discharge planning process, led by the attending physician.  Recommendations may be updated based on patient status, additional functional criteria and insurance authorization.  Follow Up Recommendations  Follow physician's recommendations for discharge plan and follow up therapies     Assistance Recommended at Discharge Intermittent Supervision/Assistance  Equipment Recommendations  Rolling walker (2 wheels)    Recommendations for Other Services       Precautions / Restrictions Precautions Precautions: Fall Precaution Comments: urostomy bag Restrictions Weight Bearing Restrictions: No RLE Weight Bearing: Weight bearing as tolerated     Mobility  Bed Mobility Overal bed mobility: Needs Assistance Bed Mobility: Supine to Sit;Sit to Supine     Supine to sit: Min assist     General bed mobility comments: min A for  R LE    Transfers Overall transfer level: Needs assistance Equipment used: Rolling walker (2 wheels) Transfers: Sit to/from [facility] Sit to Stand: Min assist Stand pivot transfers: Min assist         General transfer comment: Cues for hand placement and R LE management; min A to rise and steady    Ambulation/Gait Ambulation/Gait assistance: Min guard Gait Distance (Feet): 5 Feet Assistive device: Rolling walker (2  wheels) Gait Pattern/deviations: Step-to pattern;Decreased weight shift to right Gait velocity: decr   General Gait Details: short distance bed to chair only 2* increasing dizziness   Stairs             Wheelchair Mobility    Modified Rankin (Stroke Patients Only)       Balance Overall balance assessment: Needs assistance Sitting-balance support: Feet supported Sitting balance-Leahy Scale: Good     Standing balance support: Bilateral upper extremity supported Standing balance-Leahy Scale: Poor Standing balance comment: requiring RW at all times                            Cognition Arousal/Alertness: Awake/alert Behavior During Therapy: WFL for tasks assessed/performed Overall Cognitive Status: Within Functional Limits for tasks assessed                                          Exercises Total Joint Exercises Ankle Circles/Pumps: AROM;Both;20 reps;Seated Quad Sets: AROM;Both;10 reps;Supine Heel Slides: AAROM;Right;Supine;20 reps Hip ABduction/ADduction: AAROM;Right;Supine;15 reps Goniometric ROM: R knee -5 - 60 pain limited    General Comments        Pertinent Vitals/Pain Pain Assessment: 0-10 Pain Score: 6  Pain Location: Rt knee Pain Descriptors / Indicators: Aching;Discomfort Pain Intervention(s): Limited activity within patient's tolerance;Monitored during session;Premedicated before session;Ice applied    Home Living                          Prior Function  PT Goals (current goals can now be found in the care plan section) Acute Rehab PT Goals Patient Stated Goal: recover independence PT Goal Formulation: With patient Time For Goal Achievement: 09/15/21 Potential to Achieve Goals: Good Progress towards PT goals: Not progressing toward goals - comment (orthostatic)    Frequency    7X/week      PT Plan Current plan remains appropriate    Co-evaluation              AM-PAC PT  "6 Clicks" Mobility   Outcome Measure  Help needed turning from your back to your side while in a flat bed without using bedrails?: A Little Help needed moving from lying on your back to sitting on the side of a flat bed without using bedrails?: A Little Help needed moving to and from a bed to a chair (including a wheelchair)?: A Little Help needed standing up from a chair using your arms (e.g., wheelchair or bedside chair)?: A Little Help needed to walk in hospital room?: A Little Help needed climbing 3-5 steps with a railing? : A Lot 6 Click Score: 17    End of Session Equipment Utilized During Treatment: Gait belt Activity Tolerance: Other (comment) (orthostatic) Patient left: with call bell/phone within reach;with family/visitor present;in bed;with bed alarm set;with SCD's reapplied Nurse Communication: Mobility status PT Visit Diagnosis: Muscle weakness (generalized) (M62.81);Difficulty in walking, not elsewhere classified (R26.2)     Time: 0812-0850 PT Time Calculation (min) (ACUTE ONLY): 38 min  Charges:  $Therapeutic Exercise: 8-22 mins $Therapeutic Activity: 8-22 mins                     Debe Coder PT Acute Rehabilitation Services Pager [phone] Office [phone]    Whitney Bingaman 09/10/2021, 9:06 AM

## 2021-09-10 NOTE — Final Progress Note (Addendum)
Subjective: 2 Days Post-Op Procedure(s) (LRB): TOTAL KNEE ARTHROPLASTY (Right) Patient reports pain as 3 on 0-10 scale.   Denies CP or SOB.  Voiding without difficulty. Positive flatus. Objective: Vital signs in last 24 hours: Temp:  [98.4 F (36.9 C)-99.6 F (37.6 C)] 99.6 F (37.6 C) (10/30 0349) Pulse Rate:  [77-96] 96 (10/30 0349) Resp:  [16-20] 20 (10/30 0349) BP: (139-164)/(71-75) 139/71 (10/30 0349) SpO2:  [89 %-94 %] 89 % (10/30 0349)  Intake/Output from previous day: 10/29 0701 - 10/30 0700 In: 291.6 [P.O.:240; I.V.:51.6] Out: 1000 [Urine:1000] Intake/Output this shift: No intake/output data recorded.  Recent Labs    09/08/21 1037 09/09/21 0300 09/10/21 0259  HGB 15.5 13.5 13.4   Recent Labs    09/09/21 0300 09/10/21 0259  WBC 17.2* 16.7*  RBC 4.53 4.48  HCT 40.5 39.8  PLT 344 349   Recent Labs    09/08/21 1037 09/09/21 0300  NA 136 133*  K 3.7 4.3  CL 105 102  CO2 22 21*  BUN 19 15  CREATININE 1.26* 1.22  GLUCOSE 192* 158*  CALCIUM 9.0 8.5*   No results for input(s): LABPT, INR in the last 72 hours.  Neurovascular intact Sensation intact distally Intact pulses distally Dorsiflexion/Plantar flexion intact Incision: dressing C/D/I, no drainage, and no DVT  Assessment/Plan:  2 Days Post-Op Procedure(s) (LRB): TOTAL KNEE ARTHROPLASTY (Right) Up with therapy Discharge home with home health OPT PT D/C instructions Eliquis  Glucose 158. No history of diabetes. Hx of hypoglycemia Check A1c Advise patient on Low glucose diet. Follow up with PCP Increase po fluids   Active Problems:   Right knee DJD      [name] 09/10/2021, @[facility] 

## 2021-09-10 NOTE — Plan of Care (Signed)
  Problem: Education: Goal: Knowledge of the prescribed therapeutic regimen will improve Outcome: Progressing   Problem: Activity: Goal: Ability to avoid complications of mobility impairment will improve Outcome: Progressing   Problem: Pain Management: Goal: Pain level will decrease with appropriate interventions Outcome: Progressing   

## 2021-09-11 ENCOUNTER — Encounter (HOSPITAL_COMMUNITY): Payer: Self-pay | Admitting: Specialist

## 2021-09-11 LAB — HEMOGLOBIN A1C
Hgb A1c MFr Bld: 5.9 % — ABNORMAL HIGH (ref 4.8–5.6)
Mean Plasma Glucose: 122.63 mg/dL

## 2021-09-11 NOTE — Progress Notes (Signed)
Physical Therapy Treatment Patient Details Name: Erik ROZA Sr. MRN: [id number] DOB: Oct 12, 1946 Today's Date: 09/11/2021   History of Present Illness Patient is 75 y.o. male s/p Rt TKA on 09/08/21 with PMH significant for anemia, anxiety, depression, fibromyalgia, urostomy, GERD, HLD, HTN, osteoporosis, RA.    PT Comments    POD # 3 am session Pt unable to perform SLR and has limited mobility so instructed to wear KI for amb.  Instructed spouse "hands on"proper application.  Assisted OOB was difficult.  General bed mobility comments: min A for  R LE instructed spouse on proper handling tech.  General transfer comment: Cues for hand placement and R LE management; min A to rise and steady.  General Gait Details: short distance bed to chair only 2* increasing dizziness and nausea.  BP WNL  Suspect OXY for dizziness.  Unsteady.   Returned to room in recliner and performed a few TE's followed by ICE.  Pt has NOT met Therapy goals and will need another session this afternoon.   Recommendations for follow up therapy are one component of a multi-disciplinary discharge planning process, led by the attending physician.  Recommendations may be updated based on patient status, additional functional criteria and insurance authorization.  Follow Up Recommendations  Follow physician's recommendations for discharge plan and follow up therapies     Assistance Recommended at Discharge Intermittent Supervision/Assistance  Equipment Recommendations  Rolling walker (2 wheels)    Recommendations for Other Services       Precautions / Restrictions Precautions Precautions: Fall Precaution Comments: urostomy bag Required Braces or Orthoses: Knee Immobilizer - Right Knee Immobilizer - Right: On when out of bed or walking Restrictions Weight Bearing Restrictions: No RLE Weight Bearing: Weight bearing as tolerated     Mobility  Bed Mobility Overal bed mobility: Needs Assistance Bed Mobility:  Supine to Sit;Sit to Supine     Supine to sit: Min assist Sit to supine: Min assist   General bed mobility comments: min A for  R LE instructed spouse on proper handling tech    Transfers Overall transfer level: Needs assistance Equipment used: Rolling walker (2 wheels) Transfers: Sit to/from [employer] Transfers Sit to Stand: Supervision           General transfer comment: Cues for hand placement and R LE management; min A to rise and steady    Ambulation/Gait Ambulation/Gait assistance: Min guard Gait Distance (Feet): 12 Feet Assistive device: Rolling walker (2 wheels) Gait Pattern/deviations: Step-to pattern;Decreased weight shift to right Gait velocity: decr   General Gait Details: short distance bed to chair only 2* increasing dizziness and nausea.  BP WNL  Suspect OXY for dizziness   Stairs             Wheelchair Mobility    Modified Rankin (Stroke Patients Only)       Balance                                            Cognition Arousal/Alertness: Awake/alert Behavior During Therapy: WFL for tasks assessed/performed Overall Cognitive Status: Within Functional Limits for tasks assessed                                 General Comments: AxO x 3 pleasant        Exercises  General Comments        Pertinent Vitals/Pain Pain Assessment: Faces Faces Pain Scale: Hurts little more Pain Location: Rt knee Pain Descriptors / Indicators: Aching;Discomfort;Operative site guarding;Tightness;Sharp Pain Intervention(s): Monitored during session;Premedicated before session;Repositioned;Ice applied    Home Living                          Prior Function            PT Goals (current goals can now be found in the care plan section) Progress towards PT goals: Progressing toward goals    Frequency    7X/week      PT Plan Current plan remains appropriate    Co-evaluation               AM-PAC PT "6 Clicks" Mobility   Outcome Measure  Help needed turning from your back to your side while in a flat bed without using bedrails?: A Little Help needed moving from lying on your back to sitting on the side of a flat bed without using bedrails?: A Little Help needed moving to and from a bed to a chair (including a wheelchair)?: A Little Help needed standing up from a chair using your arms (e.g., wheelchair or bedside chair)?: A Little Help needed to walk in hospital room?: A Little Help needed climbing 3-5 steps with a railing? : A Lot 6 Click Score: 17    End of Session Equipment Utilized During Treatment: Gait belt Activity Tolerance: Other (comment) (dizzy/nausea) Patient left: with call bell/phone within reach;with family/visitor present;in bed;with bed alarm set;with SCD's reapplied Nurse Communication: Mobility status PT Visit Diagnosis: Muscle weakness (generalized) (M62.81);Difficulty in walking, not elsewhere classified (R26.2)     Time: 3539-1225 PT Time Calculation (min) (ACUTE ONLY): 40 min  Charges:  $Gait Training: 8-22 mins $Therapeutic Activity: 23-37 mins                     Rica Koyanagi  PTA Acute  Rehabilitation Services Pager      [phone] Office      [phone]

## 2021-09-11 NOTE — Discharge Summary (Addendum)
Physician Discharge Summary   Patient ID: Erik Lolling Sr. MRN: [id number] DOB/AGE: 1946-06-28 75 y.o.  Admit date: 09/08/2021 Discharge date: 09/11/2021  Primary Diagnosis: primary right knee osteoarthritis  Admission Diagnoses:  Past Medical History:  Diagnosis Date   Allergic rhinitis    Allergy    Anal fissure    Anemia    Anxiety    Blood transfusion without reported diagnosis    Cataract    starting   Coronary atherosclerosis of native coronary artery 2007   nonobstructive CAD by cath with 40% mid-LAD stenosis   Degenerative arthritis of knee, bilateral 10/09/2017   Depression    Diverticulosis 12/18/2011   Diverticulosis of colon with hemorrhage    April 2021, [facility] healthcare   Fibromyalgia    GERD with stricture    Helicobacter pylori (H. 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   Weight as of this encounter: 73.6 kg.  Procedure:  Procedure(s) (LRB): TOTAL KNEE ARTHROPLASTY (Right)   Consults: None  HPI: see H&P Laboratory Data: Admission on 09/08/2021  Component Date Value Ref Range Status   Sodium 09/08/2021 136  135 - 145 mmol/L Final   Potassium 09/08/2021 3.7  3.5 - 5.1 mmol/L Final   Chloride 09/08/2021 105  98 - 111  mmol/L Final   CO2 09/08/2021 22  22 - 32 mmol/L Final   Glucose, Bld 09/08/2021 192 (A)  70 - 99 mg/dL Final   Glucose reference range applies only to samples taken after fasting for at least 8 hours.   BUN 09/08/2021 19  8 - 23 mg/dL Final   Creatinine, Ser 09/08/2021 1.26 (A)  0.61 - 1.24 mg/dL Final   Calcium 09/08/2021 9.0  8.9 - 10.3 mg/dL Final   GFR, Estimated 09/08/2021 60 (A)  >60 mL/min Final   Comment: (NOTE) Calculated using the CKD-EPI Creatinine Equation (2021)    Anion gap 09/08/2021 9  5 - 15 Final   Performed at [facility], [place] [address] [place], [place] [address]   WBC 09/08/2021 8.6  4.0 - 10.5 K/uL Final   RBC 09/08/2021 5.12  4.22 - 5.81 MIL/uL Final   Hemoglobin 09/08/2021 15.5  13.0 - 17.0 g/dL Final   HCT 09/08/2021 46.2  39.0 - 52.0 % Final   MCV 09/08/2021 90.2  80.0 - 100.0 fL Final   MCH 09/08/2021 30.3  26.0 - 34.0 pg Final   MCHC 09/08/2021 33.5  30.0 - 36.0 g/dL Final   RDW 09/08/2021 12.5  11.5 - 15.5 % Final   Platelets 09/08/2021 322  150 - 400 K/uL Final   nRBC 09/08/2021 0.0  0.0 - 0.2 %  Final   Performed at [facility], [place] [address], [place], [place] [address]   WBC 09/09/2021 17.2 (A)  4.0 - 10.5 K/uL Final   RBC 09/09/2021 4.53  4.22 - 5.81 MIL/uL Final   Hemoglobin 09/09/2021 13.5  13.0 - 17.0 g/dL Final   HCT 09/09/2021 40.5  39.0 - 52.0 % Final   MCV 09/09/2021 89.4  80.0 - 100.0 fL Final   MCH 09/09/2021 29.8  26.0 - 34.0 pg Final   MCHC 09/09/2021 33.3  30.0 - 36.0 g/dL Final   RDW 09/09/2021 12.6  11.5 - 15.5 % Final   Platelets 09/09/2021 344  150 - 400 K/uL Final   nRBC 09/09/2021 0.0  0.0 - 0.2 % Final   Performed at [facility], [place] [address] [place], [place] [address]   Sodium 09/09/2021 133 (A)  135 - 145 mmol/L Final   Potassium 09/09/2021 4.3  3.5 - 5.1 mmol/L Final   Chloride 09/09/2021 102  98 - 111 mmol/L Final   CO2 09/09/2021 21 (A)  22 - 32 mmol/L Final    Glucose, Bld 09/09/2021 158 (A)  70 - 99 mg/dL Final   Glucose reference range applies only to samples taken after fasting for at least 8 hours.   BUN 09/09/2021 15  8 - 23 mg/dL Final   Creatinine, Ser 09/09/2021 1.22  0.61 - 1.24 mg/dL Final   Calcium 09/09/2021 8.5 (A)  8.9 - 10.3 mg/dL Final   GFR, Estimated 09/09/2021 >60  >60 mL/min Final   Comment: (NOTE) Calculated using the CKD-EPI Creatinine Equation (2021)    Anion gap 09/09/2021 10  5 - 15 Final   Performed at [facility], [place] [address] [place], [place] [address]   WBC 09/10/2021 16.7 (A)  4.0 - 10.5 K/uL Final   RBC 09/10/2021 4.48  4.22 - 5.81 MIL/uL Final   Hemoglobin 09/10/2021 13.4  13.0 - 17.0 g/dL Final   HCT 09/10/2021 39.8  39.0 - 52.0 % Final   MCV 09/10/2021 88.8  80.0 - 100.0 fL Final   MCH 09/10/2021 29.9  26.0 - 34.0 pg Final   MCHC 09/10/2021 33.7  30.0 - 36.0 g/dL Final   RDW 09/10/2021 12.8  11.5 - 15.5 % Final   Platelets 09/10/2021 349  150 - 400 K/uL Final   nRBC 09/10/2021 0.0  0.0 - 0.2 % Final   Performed at [facility], [place] [address], [place], [place] [address]   Hgb A1c MFr Bld 09/10/2021 5.9 (A)  4.8 - 5.6 % Final   Comment: (NOTE) Pre diabetes:          5.7%-6.4%  Diabetes:              >6.4%  Glycemic control for   <7.0% adults with diabetes    Mean Plasma Glucose 09/10/2021 122.63  mg/dL Final   Performed at [facility], [place] [address], [place], [place] [address]  Orders Only on 09/06/2021  Component Date Value Ref Range Status   SARS Coronavirus 2 09/06/2021 RESULT: NEGATIVE   Final   Comment: RESULT: NEGATIVESARS-CoV-2 INTERPRETATION:A NEGATIVE  test result means that SARS-CoV-2 RNA was not present in the specimen above the limit of detection of this test. This does not preclude a possible SARS-CoV-2 infection and should not be used as the  sole basis for patient management decisions. Negative results must be combined with clinical  observations, patient history, and epidemiological information. Optimum specimen  types and timing for peak viral levels during infections caused by SARS-CoV-2  have not been determined. Collection of multiple specimens or types of specimens may be necessary to detect virus. Improper specimen collection and handling, sequence variability under primers/probes, or organism present below the limit of detection may  lead to false negative results. Positive and negative predictive values of testing are highly dependent on prevalence. False negative test results are more likely when prevalence of disease is high.The expected result is NEGATIVE.Fact S                          heet for  Healthcare Providers: LocalChronicle.no Sheet for Patients: SalonLookup.es Reference Range - Negative   Hospital Outpatient Visit on 08/31/2021  Component Date Value Ref Range Status   MRSA, PCR 08/31/2021 NEGATIVE  NEGATIVE Final   Staphylococcus aureus 08/31/2021 NEGATIVE  NEGATIVE Final   Comment: (NOTE) The Xpert SA Assay (FDA approved for NASAL specimens in patients 84 years of age and older), is one component of a comprehensive surveillance program. It is not intended to diagnose infection nor to guide or monitor treatment. Performed at [facility], [place] [address], [place], [place] [address]    WBC 08/31/2021 6.2  4.0 - 10.5 K/uL Final   RBC 08/31/2021 5.47  4.22 - 5.81 MIL/uL Final   Hemoglobin 08/31/2021 16.3  13.0 - 17.0 g/dL Final   HCT 08/31/2021 48.5  39.0 - 52.0 % Final   MCV 08/31/2021 88.7  80.0 - 100.0 fL Final   MCH 08/31/2021 29.8  26.0 - 34.0 pg Final   MCHC 08/31/2021 33.6  30.0 - 36.0 g/dL Final   RDW 08/31/2021 12.8  11.5 - 15.5 % Final   Platelets 08/31/2021 230  150 - 400 K/uL Final   nRBC 08/31/2021 0.0  0.0 - 0.2 % Final   Performed at [facility], [place] [address], [place], [place] [address]    Sodium 08/31/2021 133 (A)  135 - 145 mmol/L Final   Potassium 08/31/2021 4.1  3.5 - 5.1 mmol/L Final   Chloride 08/31/2021 102  98 - 111 mmol/L Final   CO2 08/31/2021 21 (A)  22 - 32 mmol/L Final   Glucose, Bld 08/31/2021 184 (A)  70 - 99 mg/dL Final   Glucose reference range applies only to samples taken after fasting for at least 8 hours.   BUN 08/31/2021 21  8 - 23 mg/dL Final   Creatinine, Ser 08/31/2021 1.42 (A)  0.61 - 1.24 mg/dL Final   Calcium 08/31/2021 9.2  8.9 - 10.3 mg/dL Final   GFR, Estimated 08/31/2021 52 (A)  >60 mL/min Final   Comment: (NOTE) Calculated using the CKD-EPI Creatinine Equation (2021)    Anion gap 08/31/2021 10  5 - 15 Final   Performed at [facility], [place] [address], [place], [place] [address]     X-Rays:DG Knee 1-2 Views Right  Result Date: 09/08/2021 CLINICAL DATA:  Status post total knee replacement using cement. EXAM: RIGHT KNEE - 1-2 VIEW COMPARISON:  None FINDINGS: Sequela of prior total knee arthroplasty. The femoral and tibial components appear well-seated. No evidence of fracture. Adjacent soft tissue swelling and soft tissue gas, not unexpected in the immediate postoperative setting. IMPRESSION: Sequela of right total knee arthroplasty, without unexpected finding. Electronically Signed   By: Kellie Simmering D.O.   On: 09/08/2021 16:43    EKG: Orders placed or performed during the hospital encounter of  08/31/21   EKG 12-Lead   EKG 12-Lead     Hospital Course: Erik Lolling Sr. is a 75 y.o. who was admitted to [facility]. They were brought to the operating room on 09/08/2021 and underwent Procedure(s): TOTAL KNEE ARTHROPLASTY.  Patient tolerated the procedure well and was later transferred to the recovery room and then to the orthopaedic floor for postoperative care.  They were given PO and IV analgesics for pain control following their surgery.  They were given 24 hours of postoperative antibiotics of   Anti-infectives (From admission, onward)    Start     Dose/Rate Route Frequency Ordered Stop   09/08/21 1930  ceFAZolin (ANCEF) IVPB 1 g/50 mL premix        1 g 100 mL/hr over 30 Minutes Intravenous Every 6 hours 09/08/21 1738 09/09/21 0002   09/08/21 1045  ceFAZolin (ANCEF) IVPB 2g/100 mL premix        2 g 200 mL/hr over 30 Minutes Intravenous On call to O.R. 09/08/21 1036 09/08/21 1324      and started on DVT prophylaxis in the form of TED hose and SCDs and Eliquis .   PT and OT were ordered for total joint protocol.  Discharge planning consulted to help with postop disposition and equipment needs.  Patient had a fair night on the evening of surgery.  They started to get up OOB with therapy on day one. HContinued to work with therapy into day two.  By day three, the patient had progressed with therapy and meeting their goals.  Incision was healing well.  Patient was seen in rounds and was ready to go home.   Diet: Regular diet Activity:WBAT Follow-up:in 10-1 days Disposition - Home Discharged Condition: good   Discharge Instructions     Call MD / Call 911   Complete by: As directed    If you experience chest pain or shortness of breath, CALL 911 and be transported to the hospital emergency room.  If you develope a fever above 101 F, pus (white drainage) or increased drainage or redness at the wound, or calf pain, call your surgeon's office.   Constipation Prevention   Complete by: As directed    Drink plenty of fluids.  Prune juice may be helpful.  You may use a stool softener, such as Colace (over the counter) 100 mg twice a day.  Use MiraLax (over the counter) for constipation as needed.   Diet - low sodium heart healthy   Complete by: As directed    Increase activity slowly as tolerated   Complete by: As directed    Post-operative opioid taper instructions:   Complete by: As directed    POST-OPERATIVE OPIOID TAPER INSTRUCTIONS: It is important to wean off of your opioid  medication as soon as possible. If you do not need pain medication after your surgery it is ok to stop day one. Opioids include: Codeine, Hydrocodone(Norco, Vicodin), Oxycodone(Percocet, oxycontin) and hydromorphone amongst others.  Long term and even short term use of opiods can cause: Increased pain response Dependence Constipation Depression Respiratory depression And more.  Withdrawal symptoms can include Flu like symptoms Nausea, vomiting And more Techniques to manage these symptoms Hydrate well Eat regular healthy meals Stay active Use relaxation techniques(deep breathing, meditating, yoga) Do Not substitute Alcohol to help with tapering If you have been on opioids for less than two weeks and do not have pain than it is ok to stop all together.  Plan to wean off  of opioids This plan should start within one week post op of your joint replacement. Maintain the same interval or time between taking each dose and first decrease the dose.  Cut the total daily intake of opioids by one tablet each day Next start to increase the time between doses. The last dose that should be eliminated is the evening dose.         Allergies as of 09/11/2021       Reactions   Ambien [zolpidem]    Unknown reaction   Bentyl [dicyclomine]    Memory loss, anxiety, blurred vision   Carafate [sucralfate] Nausea And Vomiting   Lansoprazole Diarrhea   Lexapro [escitalopram Oxalate]    Other    Pt states he is sensitive to all oral medications   Pneumovax [pneumococcal Polysaccharide Vaccine] Other (See Comments)   pain   Prilosec [omeprazole] Other (See Comments)   Per patient joint pain with PPI's   Remeron [mirtazapine]    Seroquel [quetiapine] Other (See Comments)   "terrible reaction"   Statins Other (See Comments)   Joint pain   Tizanidine Other (See Comments)   Made patient "feel crazy"   Tramadol Other (See Comments)   Can only take for 2 takes then he starts to have pain    Tylenol [acetaminophen]    Can only take for 2 takes then he starts to have pain        Medication List     STOP taking these medications    cyclobenzaprine 5 MG tablet Commonly known as: FLEXERIL   HYDROcodone-acetaminophen 5-325 MG tablet Commonly known as: NORCO/VICODIN   predniSONE 10 MG (21) Tbpk tablet Commonly known as: STERAPRED UNI-PAK 21 TAB   traMADol 50 MG tablet Commonly known as: ULTRAM   trolamine salicylate 10 % cream Commonly known as: ASPERCREME       TAKE these medications    apixaban 2.5 MG Tabs tablet Commonly known as: ELIQUIS Take 1 tablet (2.5 mg total) by mouth 2 (two) times daily.   cholecalciferol 25 MCG (1000 UNIT) tablet Commonly known as: VITAMIN D3 Take 1,000 Units by mouth daily.   diazepam 5 MG tablet Commonly known as: VALIUM 1 tab by mouth x 1 at one hour prior to procedure   docusate sodium 100 MG capsule Commonly known as: Colace Take 1 capsule (100 mg total) by mouth 2 (two) times daily as needed for mild constipation.   famotidine 40 MG tablet Commonly known as: Pepcid Take 1 tablet (40 mg total) by mouth 2 (two) times daily. What changed: when to take this   hydrOXYzine 10 MG tablet Commonly known as: ATARAX/VISTARIL Take 1 tablet (10 mg total) by mouth at bedtime.   LORazepam 1 MG tablet Commonly known as: ATIVAN Take 1 tablet (1 mg total) by mouth 2 (two) times daily as needed for anxiety. What changed: how much to take   metoCLOPramide 10 MG tablet Commonly known as: Reglan Take 1 tablet (10 mg total) by mouth at bedtime.   nortriptyline 10 MG capsule Commonly known as: PAMELOR Take 10 mg by mouth daily.   oxyCODONE 5 MG immediate release tablet Commonly known as: Oxy IR/ROXICODONE Take 1 tablet (5 mg total) by mouth every 4 (four) hours as needed for severe pain.   pantoprazole 40 MG tablet Commonly known as: PROTONIX Take 40 mg by mouth daily.   polyethylene glycol 17 g packet Commonly known as:  MIRALAX / GLYCOLAX Take 17 g by mouth daily.   SYSTANE  OP Place 1 drop into both eyes 2 (two) times daily as needed (dry/irritated eyes).               Durable Medical Equipment  (From admission, onward)           Start     Ordered   09/09/21 1229  For home use only DME Walker rolling  Once       Question Answer Comment  Walker: With 5 Inch Wheels   Patient needs a walker to treat with the following condition S/P knee surgery      09/09/21 1229   09/09/21 1229  For home use only DME Bedside commode  Once       Question:  Patient needs a bedside commode to treat with the following condition  Answer:  S/P knee surgery   09/09/21 1229            Follow-up Information     Susa Day, MD. Go on 09/22/2021.   Specialty: Orthopedic Surgery Why: You are scheduled for first post op appointment on Friday November 11th at 10:45am. Contact information: [address] [place] [address] [phone]                 Signed:  Lacie Draft, PA-C Orthopaedic Surgery 09/11/2021, 10:58 AM

## 2021-09-11 NOTE — Progress Notes (Signed)
Subjective: 3 Days Post-Op Procedure(s) (LRB): TOTAL KNEE ARTHROPLASTY (Right) Patient reports pain as moderate.   Reports some foot pain and swelling today.  Objective: Vital signs in last 24 hours: Temp:  [99.5 F (37.5 C)-99.8 F (37.7 C)] 99.8 F (37.7 C) (10/31 0621) Pulse Rate:  [91-100] 100 (10/31 0621) Resp:  [16-18] 16 (10/31 0621) BP: (149-159)/(75-77) 149/76 (10/31 0621) SpO2:  [92 %-95 %] 92 % (10/31 0621) Weight:  [73.6 kg] 73.6 kg (10/30 2300)  Intake/Output from previous day: 10/30 0701 - 10/31 0700 In: 683 [P.O.:250; IV Piggyback:433] Out: 1500 [Urine:1500] Intake/Output this shift: Total I/O In: 340 [P.O.:340] Out: 200 [Urine:200]  Recent Labs    09/09/21 0300 09/10/21 0259  HGB 13.5 13.4   Recent Labs    09/09/21 0300 09/10/21 0259  WBC 17.2* 16.7*  RBC 4.53 4.48  HCT 40.5 39.8  PLT 344 349   Recent Labs    09/09/21 0300  NA 133*  K 4.3  CL 102  CO2 21*  BUN 15  CREATININE 1.22  GLUCOSE 158*  CALCIUM 8.5*   No results for input(s): LABPT, INR in the last 72 hours.  Neurologically intact ABD soft Neurovascular intact Sensation intact distally Intact pulses distally Dorsiflexion/Plantar flexion intact Incision: dressing C/D/I and no drainage No cellulitis present Compartment soft No calf pain or sign of DVT. ACE bunched some at ankle. Sensation intact foot.   Assessment/Plan: 3 Days Post-Op Procedure(s) (LRB): TOTAL KNEE ARTHROPLASTY (Right) Advance diet Up with therapy D/C IV fluids D/C ACE Switch to TEDS B/L LE Plan D/C to home today, outpt therapy arranged Continue ice and elevation for swelling, toes above the nose if possible Will discuss with Dr Valentino Saxon Deatra Robinson 09/11/2021, 10:55 AM

## 2021-09-11 NOTE — Progress Notes (Signed)
Physical Therapy Treatment Patient Details Name: Erik FITZHENRY Sr. MRN: [id number] DOB: 12/10/1945 Today's Date: 09/11/2021   History of Present Illness Patient is 75 y.o. male s/p Rt TKA on 09/08/21 with PMH significant for anemia, anxiety, depression, fibromyalgia, urostomy, GERD, HLD, HTN, osteoporosis, RA.    PT Comments    POD # 3 pm session Pt performing better enough to D/C to home with family support.  Spouse present at both Therapy session today for education on safe handling, KI use, use of ICE and TE's. Addressed all mobility questions, discussed appropriate activity, educated on use of ICE.  Pt ready for D/C to home.   Recommendations for follow up therapy are one component of a multi-disciplinary discharge planning process, led by the attending physician.  Recommendations may be updated based on patient status, additional functional criteria and insurance authorization.  Follow Up Recommendations  Follow physician's recommendations for discharge plan and follow up therapies     Assistance Recommended at Discharge Intermittent Supervision/Assistance  Equipment Recommendations  Rolling walker (2 wheels)    Recommendations for Other Services       Precautions / Restrictions Precautions Precautions: Fall Precaution Comments: urostomy bag Required Braces or Orthoses: Knee Immobilizer - Right Knee Immobilizer - Right: On when out of bed or walking Restrictions Weight Bearing Restrictions: No RLE Weight Bearing: Weight bearing as tolerated     Mobility  Bed Mobility Overal bed mobility: Needs Assistance Bed Mobility: Supine to Sit;Sit to Supine     Supine to sit: Min assist Sit to supine: Min assist   General bed mobility comments: min A for  R LE instructed spouse on proper handling tech    Transfers Overall transfer level: Needs assistance Equipment used: Rolling walker (2 wheels) Transfers: Sit to/from [employer] Transfers Sit to Stand:  Supervision           General transfer comment: Cues for hand placement and R LE management; min A to rise and steady    Ambulation/Gait Ambulation/Gait assistance: Min guard Gait Distance (Feet): 45 Feet Assistive device: Rolling walker (2 wheels) Gait Pattern/deviations: Step-to pattern;Decreased weight shift to right Gait velocity: decr   General Gait Details: tolerated an increased distance.  Amb with KI for support.  Had spouse "hands on" assist pt with gait.  Pt still c/o dizziness but improved from this morning.  Educated to avoid taking OXY and use Tramadol esp during the day.  Take OXY HS "if needed"   Stairs             Wheelchair Mobility    Modified Rankin (Stroke Patients Only)       Balance                                            Cognition Arousal/Alertness: Awake/alert Behavior During Therapy: WFL for tasks assessed/performed Overall Cognitive Status: Within Functional Limits for tasks assessed                                 General Comments: AxO x 3 pleasant        Exercises      General Comments        Pertinent Vitals/Pain Pain Assessment: Faces Faces Pain Scale: Hurts little more Pain Location: Rt knee Pain Descriptors / Indicators: Aching;Discomfort;Operative site guarding;Tightness;Sharp Pain Intervention(s): Monitored  during session;Premedicated before session;Repositioned;Ice applied    Home Living                          Prior Function            PT Goals (current goals can now be found in the care plan section) Progress towards PT goals: Progressing toward goals    Frequency    7X/week      PT Plan Current plan remains appropriate    Co-evaluation              AM-PAC PT "6 Clicks" Mobility   Outcome Measure  Help needed turning from your back to your side while in a flat bed without using bedrails?: A Little Help needed moving from lying on your back to  sitting on the side of a flat bed without using bedrails?: A Little Help needed moving to and from a bed to a chair (including a wheelchair)?: A Little Help needed standing up from a chair using your arms (e.g., wheelchair or bedside chair)?: A Little Help needed to walk in hospital room?: A Little Help needed climbing 3-5 steps with a railing? : A Lot 6 Click Score: 17    End of Session Equipment Utilized During Treatment: Gait belt Activity Tolerance: Other (comment) (dizzy/nausea) Patient left: with call bell/phone within reach;with family/visitor present;in bed;with bed alarm set;with SCD's reapplied Nurse Communication: Mobility status PT Visit Diagnosis: Muscle weakness (generalized) (M62.81);Difficulty in walking, not elsewhere classified (R26.2)     Time: 1415-1440 PT Time Calculation (min) (ACUTE ONLY): 25 min  Charges:  $Gait Training: 8-22 mins $Therapeutic Activity: 8-22 mins                     {Eyva Califano  PTA Acute  Rehabilitation Services Pager      [phone] Office      [phone]

## 2021-09-15 DIAGNOSIS — E785 Hyperlipidemia, unspecified: Secondary | ICD-10-CM | POA: Diagnosis not present

## 2021-09-15 DIAGNOSIS — G4700 Insomnia, unspecified: Secondary | ICD-10-CM | POA: Diagnosis not present

## 2021-09-15 DIAGNOSIS — G2581 Restless legs syndrome: Secondary | ICD-10-CM | POA: Diagnosis not present

## 2021-09-15 DIAGNOSIS — Z96651 Presence of right artificial knee joint: Secondary | ICD-10-CM | POA: Diagnosis not present

## 2021-09-15 DIAGNOSIS — M810 Age-related osteoporosis without current pathological fracture: Secondary | ICD-10-CM | POA: Diagnosis not present

## 2021-09-15 DIAGNOSIS — F32A Depression, unspecified: Secondary | ICD-10-CM | POA: Diagnosis not present

## 2021-09-15 DIAGNOSIS — I2510 Atherosclerotic heart disease of native coronary artery without angina pectoris: Secondary | ICD-10-CM | POA: Diagnosis not present

## 2021-09-15 DIAGNOSIS — M797 Fibromyalgia: Secondary | ICD-10-CM | POA: Diagnosis not present

## 2021-09-15 DIAGNOSIS — M069 Rheumatoid arthritis, unspecified: Secondary | ICD-10-CM | POA: Diagnosis not present

## 2021-09-15 DIAGNOSIS — K219 Gastro-esophageal reflux disease without esophagitis: Secondary | ICD-10-CM | POA: Diagnosis not present

## 2021-09-15 DIAGNOSIS — Z471 Aftercare following joint replacement surgery: Secondary | ICD-10-CM | POA: Diagnosis not present

## 2021-09-19 DIAGNOSIS — M810 Age-related osteoporosis without current pathological fracture: Secondary | ICD-10-CM | POA: Diagnosis not present

## 2021-09-19 DIAGNOSIS — M069 Rheumatoid arthritis, unspecified: Secondary | ICD-10-CM | POA: Diagnosis not present

## 2021-09-19 DIAGNOSIS — F32A Depression, unspecified: Secondary | ICD-10-CM | POA: Diagnosis not present

## 2021-09-19 DIAGNOSIS — G4700 Insomnia, unspecified: Secondary | ICD-10-CM | POA: Diagnosis not present

## 2021-09-19 DIAGNOSIS — E785 Hyperlipidemia, unspecified: Secondary | ICD-10-CM | POA: Diagnosis not present

## 2021-09-19 DIAGNOSIS — K219 Gastro-esophageal reflux disease without esophagitis: Secondary | ICD-10-CM | POA: Diagnosis not present

## 2021-09-19 DIAGNOSIS — I2510 Atherosclerotic heart disease of native coronary artery without angina pectoris: Secondary | ICD-10-CM | POA: Diagnosis not present

## 2021-09-19 DIAGNOSIS — G2581 Restless legs syndrome: Secondary | ICD-10-CM | POA: Diagnosis not present

## 2021-09-19 DIAGNOSIS — Z471 Aftercare following joint replacement surgery: Secondary | ICD-10-CM | POA: Diagnosis not present

## 2021-09-19 DIAGNOSIS — M797 Fibromyalgia: Secondary | ICD-10-CM | POA: Diagnosis not present

## 2021-09-19 DIAGNOSIS — Z96651 Presence of right artificial knee joint: Secondary | ICD-10-CM | POA: Diagnosis not present

## 2021-09-21 DIAGNOSIS — K219 Gastro-esophageal reflux disease without esophagitis: Secondary | ICD-10-CM | POA: Diagnosis not present

## 2021-09-21 DIAGNOSIS — F32A Depression, unspecified: Secondary | ICD-10-CM | POA: Diagnosis not present

## 2021-09-21 DIAGNOSIS — M797 Fibromyalgia: Secondary | ICD-10-CM | POA: Diagnosis not present

## 2021-09-21 DIAGNOSIS — G4700 Insomnia, unspecified: Secondary | ICD-10-CM | POA: Diagnosis not present

## 2021-09-21 DIAGNOSIS — E785 Hyperlipidemia, unspecified: Secondary | ICD-10-CM | POA: Diagnosis not present

## 2021-09-21 DIAGNOSIS — G2581 Restless legs syndrome: Secondary | ICD-10-CM | POA: Diagnosis not present

## 2021-09-21 DIAGNOSIS — M810 Age-related osteoporosis without current pathological fracture: Secondary | ICD-10-CM | POA: Diagnosis not present

## 2021-09-21 DIAGNOSIS — Z96651 Presence of right artificial knee joint: Secondary | ICD-10-CM | POA: Diagnosis not present

## 2021-09-21 DIAGNOSIS — I2510 Atherosclerotic heart disease of native coronary artery without angina pectoris: Secondary | ICD-10-CM | POA: Diagnosis not present

## 2021-09-21 DIAGNOSIS — Z471 Aftercare following joint replacement surgery: Secondary | ICD-10-CM | POA: Diagnosis not present

## 2021-09-21 DIAGNOSIS — M069 Rheumatoid arthritis, unspecified: Secondary | ICD-10-CM | POA: Diagnosis not present

## 2021-09-22 DIAGNOSIS — Z4789 Encounter for other orthopedic aftercare: Secondary | ICD-10-CM | POA: Diagnosis not present

## 2021-09-26 DIAGNOSIS — M810 Age-related osteoporosis without current pathological fracture: Secondary | ICD-10-CM | POA: Diagnosis not present

## 2021-09-26 DIAGNOSIS — Z471 Aftercare following joint replacement surgery: Secondary | ICD-10-CM | POA: Diagnosis not present

## 2021-09-26 DIAGNOSIS — E785 Hyperlipidemia, unspecified: Secondary | ICD-10-CM | POA: Diagnosis not present

## 2021-09-26 DIAGNOSIS — M069 Rheumatoid arthritis, unspecified: Secondary | ICD-10-CM | POA: Diagnosis not present

## 2021-09-26 DIAGNOSIS — G4700 Insomnia, unspecified: Secondary | ICD-10-CM | POA: Diagnosis not present

## 2021-09-26 DIAGNOSIS — M797 Fibromyalgia: Secondary | ICD-10-CM | POA: Diagnosis not present

## 2021-09-26 DIAGNOSIS — Z96651 Presence of right artificial knee joint: Secondary | ICD-10-CM | POA: Diagnosis not present

## 2021-09-26 DIAGNOSIS — G2581 Restless legs syndrome: Secondary | ICD-10-CM | POA: Diagnosis not present

## 2021-09-26 DIAGNOSIS — I2510 Atherosclerotic heart disease of native coronary artery without angina pectoris: Secondary | ICD-10-CM | POA: Diagnosis not present

## 2021-09-26 DIAGNOSIS — K219 Gastro-esophageal reflux disease without esophagitis: Secondary | ICD-10-CM | POA: Diagnosis not present

## 2021-09-26 DIAGNOSIS — F32A Depression, unspecified: Secondary | ICD-10-CM | POA: Diagnosis not present

## 2021-09-28 DIAGNOSIS — Z471 Aftercare following joint replacement surgery: Secondary | ICD-10-CM | POA: Diagnosis not present

## 2021-09-28 DIAGNOSIS — F32A Depression, unspecified: Secondary | ICD-10-CM | POA: Diagnosis not present

## 2021-09-28 DIAGNOSIS — M069 Rheumatoid arthritis, unspecified: Secondary | ICD-10-CM | POA: Diagnosis not present

## 2021-09-28 DIAGNOSIS — Z96651 Presence of right artificial knee joint: Secondary | ICD-10-CM | POA: Diagnosis not present

## 2021-09-28 DIAGNOSIS — M797 Fibromyalgia: Secondary | ICD-10-CM | POA: Diagnosis not present

## 2021-09-28 DIAGNOSIS — E785 Hyperlipidemia, unspecified: Secondary | ICD-10-CM | POA: Diagnosis not present

## 2021-09-28 DIAGNOSIS — G4700 Insomnia, unspecified: Secondary | ICD-10-CM | POA: Diagnosis not present

## 2021-09-28 DIAGNOSIS — I2510 Atherosclerotic heart disease of native coronary artery without angina pectoris: Secondary | ICD-10-CM | POA: Diagnosis not present

## 2021-09-28 DIAGNOSIS — K219 Gastro-esophageal reflux disease without esophagitis: Secondary | ICD-10-CM | POA: Diagnosis not present

## 2021-09-28 DIAGNOSIS — M810 Age-related osteoporosis without current pathological fracture: Secondary | ICD-10-CM | POA: Diagnosis not present

## 2021-09-28 DIAGNOSIS — G2581 Restless legs syndrome: Secondary | ICD-10-CM | POA: Diagnosis not present

## 2021-10-02 DIAGNOSIS — Z932 Ileostomy status: Secondary | ICD-10-CM | POA: Diagnosis not present

## 2021-10-03 DIAGNOSIS — K219 Gastro-esophageal reflux disease without esophagitis: Secondary | ICD-10-CM | POA: Diagnosis not present

## 2021-10-03 DIAGNOSIS — I2510 Atherosclerotic heart disease of native coronary artery without angina pectoris: Secondary | ICD-10-CM | POA: Diagnosis not present

## 2021-10-03 DIAGNOSIS — M810 Age-related osteoporosis without current pathological fracture: Secondary | ICD-10-CM | POA: Diagnosis not present

## 2021-10-03 DIAGNOSIS — Z96651 Presence of right artificial knee joint: Secondary | ICD-10-CM | POA: Diagnosis not present

## 2021-10-03 DIAGNOSIS — M797 Fibromyalgia: Secondary | ICD-10-CM | POA: Diagnosis not present

## 2021-10-03 DIAGNOSIS — F32A Depression, unspecified: Secondary | ICD-10-CM | POA: Diagnosis not present

## 2021-10-03 DIAGNOSIS — M069 Rheumatoid arthritis, unspecified: Secondary | ICD-10-CM | POA: Diagnosis not present

## 2021-10-03 DIAGNOSIS — G2581 Restless legs syndrome: Secondary | ICD-10-CM | POA: Diagnosis not present

## 2021-10-03 DIAGNOSIS — Z471 Aftercare following joint replacement surgery: Secondary | ICD-10-CM | POA: Diagnosis not present

## 2021-10-03 DIAGNOSIS — E785 Hyperlipidemia, unspecified: Secondary | ICD-10-CM | POA: Diagnosis not present

## 2021-10-03 DIAGNOSIS — G4700 Insomnia, unspecified: Secondary | ICD-10-CM | POA: Diagnosis not present

## 2021-10-09 DIAGNOSIS — I2510 Atherosclerotic heart disease of native coronary artery without angina pectoris: Secondary | ICD-10-CM | POA: Diagnosis not present

## 2021-10-09 DIAGNOSIS — E785 Hyperlipidemia, unspecified: Secondary | ICD-10-CM | POA: Diagnosis not present

## 2021-10-09 DIAGNOSIS — M810 Age-related osteoporosis without current pathological fracture: Secondary | ICD-10-CM | POA: Diagnosis not present

## 2021-10-09 DIAGNOSIS — K219 Gastro-esophageal reflux disease without esophagitis: Secondary | ICD-10-CM | POA: Diagnosis not present

## 2021-10-09 DIAGNOSIS — F32A Depression, unspecified: Secondary | ICD-10-CM | POA: Diagnosis not present

## 2021-10-09 DIAGNOSIS — G2581 Restless legs syndrome: Secondary | ICD-10-CM | POA: Diagnosis not present

## 2021-10-09 DIAGNOSIS — M069 Rheumatoid arthritis, unspecified: Secondary | ICD-10-CM | POA: Diagnosis not present

## 2021-10-09 DIAGNOSIS — Z471 Aftercare following joint replacement surgery: Secondary | ICD-10-CM | POA: Diagnosis not present

## 2021-10-09 DIAGNOSIS — M797 Fibromyalgia: Secondary | ICD-10-CM | POA: Diagnosis not present

## 2021-10-09 DIAGNOSIS — G4700 Insomnia, unspecified: Secondary | ICD-10-CM | POA: Diagnosis not present

## 2021-10-09 DIAGNOSIS — Z96651 Presence of right artificial knee joint: Secondary | ICD-10-CM | POA: Diagnosis not present

## 2021-10-10 DIAGNOSIS — M25561 Pain in right knee: Secondary | ICD-10-CM | POA: Diagnosis not present

## 2021-10-10 DIAGNOSIS — M25661 Stiffness of right knee, not elsewhere classified: Secondary | ICD-10-CM | POA: Diagnosis not present

## 2021-10-10 DIAGNOSIS — M6281 Muscle weakness (generalized): Secondary | ICD-10-CM | POA: Diagnosis not present

## 2021-10-13 DIAGNOSIS — M25661 Stiffness of right knee, not elsewhere classified: Secondary | ICD-10-CM | POA: Diagnosis not present

## 2021-10-13 DIAGNOSIS — M6281 Muscle weakness (generalized): Secondary | ICD-10-CM | POA: Diagnosis not present

## 2021-10-13 DIAGNOSIS — M25561 Pain in right knee: Secondary | ICD-10-CM | POA: Diagnosis not present

## 2021-10-16 DIAGNOSIS — M25661 Stiffness of right knee, not elsewhere classified: Secondary | ICD-10-CM | POA: Diagnosis not present

## 2021-10-16 DIAGNOSIS — M25561 Pain in right knee: Secondary | ICD-10-CM | POA: Diagnosis not present

## 2021-10-16 DIAGNOSIS — M6281 Muscle weakness (generalized): Secondary | ICD-10-CM | POA: Diagnosis not present

## 2021-10-18 DIAGNOSIS — M6281 Muscle weakness (generalized): Secondary | ICD-10-CM | POA: Diagnosis not present

## 2021-10-18 DIAGNOSIS — M25661 Stiffness of right knee, not elsewhere classified: Secondary | ICD-10-CM | POA: Diagnosis not present

## 2021-10-18 DIAGNOSIS — M25561 Pain in right knee: Secondary | ICD-10-CM | POA: Diagnosis not present

## 2021-10-20 DIAGNOSIS — Z96651 Presence of right artificial knee joint: Secondary | ICD-10-CM | POA: Diagnosis not present

## 2021-10-24 DIAGNOSIS — M6281 Muscle weakness (generalized): Secondary | ICD-10-CM | POA: Diagnosis not present

## 2021-10-24 DIAGNOSIS — M25661 Stiffness of right knee, not elsewhere classified: Secondary | ICD-10-CM | POA: Diagnosis not present

## 2021-10-24 DIAGNOSIS — M25561 Pain in right knee: Secondary | ICD-10-CM | POA: Diagnosis not present

## 2021-10-26 DIAGNOSIS — M6281 Muscle weakness (generalized): Secondary | ICD-10-CM | POA: Diagnosis not present

## 2021-10-26 DIAGNOSIS — M25561 Pain in right knee: Secondary | ICD-10-CM | POA: Diagnosis not present

## 2021-10-26 DIAGNOSIS — M25511 Pain in right shoulder: Secondary | ICD-10-CM | POA: Diagnosis not present

## 2021-10-26 DIAGNOSIS — M25512 Pain in left shoulder: Secondary | ICD-10-CM | POA: Diagnosis not present

## 2021-10-26 DIAGNOSIS — M25661 Stiffness of right knee, not elsewhere classified: Secondary | ICD-10-CM | POA: Diagnosis not present

## 2021-10-30 DIAGNOSIS — Z932 Ileostomy status: Secondary | ICD-10-CM | POA: Diagnosis not present

## 2021-10-31 DIAGNOSIS — M6281 Muscle weakness (generalized): Secondary | ICD-10-CM | POA: Diagnosis not present

## 2021-10-31 DIAGNOSIS — M25512 Pain in left shoulder: Secondary | ICD-10-CM | POA: Diagnosis not present

## 2021-10-31 DIAGNOSIS — M25661 Stiffness of right knee, not elsewhere classified: Secondary | ICD-10-CM | POA: Diagnosis not present

## 2021-10-31 DIAGNOSIS — M25511 Pain in right shoulder: Secondary | ICD-10-CM | POA: Diagnosis not present

## 2021-10-31 DIAGNOSIS — M25561 Pain in right knee: Secondary | ICD-10-CM | POA: Diagnosis not present

## 2021-11-02 DIAGNOSIS — M25512 Pain in left shoulder: Secondary | ICD-10-CM | POA: Diagnosis not present

## 2021-11-02 DIAGNOSIS — M25561 Pain in right knee: Secondary | ICD-10-CM | POA: Diagnosis not present

## 2021-11-02 DIAGNOSIS — M6281 Muscle weakness (generalized): Secondary | ICD-10-CM | POA: Diagnosis not present

## 2021-11-02 DIAGNOSIS — M25511 Pain in right shoulder: Secondary | ICD-10-CM | POA: Diagnosis not present

## 2021-11-02 DIAGNOSIS — M25661 Stiffness of right knee, not elsewhere classified: Secondary | ICD-10-CM | POA: Diagnosis not present

## 2021-11-07 ENCOUNTER — Encounter: Payer: Self-pay | Admitting: Internal Medicine

## 2021-11-07 ENCOUNTER — Other Ambulatory Visit: Payer: Self-pay

## 2021-11-07 ENCOUNTER — Ambulatory Visit (INDEPENDENT_AMBULATORY_CARE_PROVIDER_SITE_OTHER): Payer: Medicare Other | Admitting: Internal Medicine

## 2021-11-07 VITALS — BP 124/70 | HR 66 | Temp 98.6°F | Ht 67.0 in | Wt 163.0 lb

## 2021-11-07 DIAGNOSIS — R197 Diarrhea, unspecified: Secondary | ICD-10-CM

## 2021-11-07 DIAGNOSIS — F5101 Primary insomnia: Secondary | ICD-10-CM | POA: Diagnosis not present

## 2021-11-07 DIAGNOSIS — R739 Hyperglycemia, unspecified: Secondary | ICD-10-CM

## 2021-11-07 DIAGNOSIS — F32A Depression, unspecified: Secondary | ICD-10-CM

## 2021-11-07 DIAGNOSIS — M1711 Unilateral primary osteoarthritis, right knee: Secondary | ICD-10-CM | POA: Diagnosis not present

## 2021-11-07 LAB — POCT GLYCOSYLATED HEMOGLOBIN (HGB A1C): Hemoglobin A1C: 5.5 % (ref 4.0–5.6)

## 2021-11-07 MED ORDER — AMITRIPTYLINE HCL 100 MG PO TABS: 100.0000 mg | ORAL_TABLET | Freq: Every evening | ORAL | 1 refills | Status: DC | PRN

## 2021-11-07 MED ORDER — LORAZEPAM 1 MG PO TABS: 1.0000 mg | ORAL_TABLET | Freq: Two times a day (BID) | ORAL | 1 refills | Status: DC | PRN

## 2021-11-07 MED ORDER — TRAMADOL HCL 50 MG PO TABS: ORAL_TABLET | ORAL | 2 refills | Status: DC

## 2021-11-07 NOTE — Addendum Note (Signed)
Addended by: Terence Lux A on: 11/07/2021 01:44 PM   Modules accepted: Orders

## 2021-11-07 NOTE — Assessment & Plan Note (Signed)
S/p TKR right  - cont PT, f/u ortho, and tramadol prn

## 2021-11-07 NOTE — Assessment & Plan Note (Signed)
Pt wants to focus on this first as primary issue, will restart elavil 100 qhs prn,  to f/u any worsening symptoms or concerns

## 2021-11-07 NOTE — Assessment & Plan Note (Signed)
Benign exam, no blood or fever, ok for otc align,  to f/u any worsening symptoms or concerns

## 2021-11-07 NOTE — Patient Instructions (Addendum)
Ok to take the [employer] probiotic for the post antibiotic diarrhea  Please take all new medication as prescribed- the amytryptilene 100 mg at bedtime (and stop the nortryptilene)  Please continue all other medications as before, including the tramadol and lorazepam (sent to pharmacy today)  Please have the pharmacy call with any other refills you may need.  Please continue your efforts at being more active, low cholesterol diet, and weight control  Please keep your appointments with your specialists as you may have planned  Your A1c was done today in the office  Please make an Appointment to return in 3 months, or sooner if needed

## 2021-11-07 NOTE — Progress Notes (Addendum)
Patient ID: Erik Lolling Sr., male   DOB: 10-26-1946, 75 y.o.   MRN: [id number]        Chief Complaint: follow up recent post op knee surgury oct 31       HPI:  Erik PORTE Sr. is a 75 y.o. male here with above, struggling with pain post op, states this is the most and worst of all his 71 procedures, is currently out of oxycodone and hydrocodone, asks to restart the tramadol;  Denies worsening depressive symptoms, suicidal ideation, or panic; has ongoing anxiety, asks to restart lorazepam.  Also with worsening difficutly sleeping, nortryptilene not working, so asks for elavil as this has been successful in the past, though he has long hx of meds that start successfuly in the past but then mysteriously stop working after some time.  Also with worsening irritability and low mood but does not want celexa for now, or referral for counseling.  Also with some mild recurring diarrhea s/p 2 course antibx for knee cellulitis post op, but Denies worsening reflux, abd pain, dysphagia, n/v, other bowel change or blood. Wt Readings from Last 3 Encounters:  11/07/21 163 lb (73.9 kg)  09/10/21 162 lb 3.2 oz (73.6 kg)  08/31/21 162 lb 3.2 oz (73.6 kg)   BP Readings from Last 3 Encounters:  11/07/21 124/70  09/11/21 (!) 148/74  08/31/21 (!) 150/77         Past Medical History:  Diagnosis Date   Allergic rhinitis    Allergy    Anal fissure    Anemia    Anxiety    Blood transfusion without reported diagnosis    Cataract    starting   Coronary atherosclerosis of native coronary artery 2007   nonobstructive CAD by cath with 40% mid-LAD stenosis   Degenerative arthritis of knee, bilateral 10/09/2017   Depression    Diverticulosis 12/18/2011   Diverticulosis of colon with hemorrhage    April 2021, [facility] healthcare   Fibromyalgia    GERD with stricture    Helicobacter pylori (H. 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   Vitamin D deficiency 11/29/2017   Past Surgical History:  Procedure Laterality Date   bladder distention     x 6   CATHETER REMOVAL     super pubic area   COLONOSCOPY     DENTAL SURGERY Right    #30 tooth extraction (right upper)   INTERSTIM IMPLANT PLACEMENT     INTERSTIM IMPLANT REMOVAL     KNEE ARTHROSCOPY     right x 2   NISSEN FUNDOPLICATION  3299   PROSTECTOMY     ROBOT ASSISTED LAPAROSCOPIC COMPLETE CYSTECT ILEAL CONDUIT     SHOULDER ARTHROSCOPY  2010   left   TONSILLECTOMY AND ADENOIDECTOMY  1957   TOTAL KNEE ARTHROPLASTY Right 09/08/2021   Procedure: TOTAL KNEE ARTHROPLASTY;  Surgeon: Erik Day, MD;  Location: [facility];  Service: Orthopedics;  Laterality: Right;   TRANSURETHRAL RESECTION OF PROSTATE     x 2   UPPER GASTROINTESTINAL ENDOSCOPY  05/13/2008   hiatal hernia   VASECTOMY      reports that he has quit smoking. He has never used smokeless tobacco. He reports that he does not drink alcohol and does not use drugs. family history includes Arthritis in his mother; Breast cancer in his sister; Colon polyps in his father; Diabetes in his father, paternal uncle, and sister; Heart disease in his father and mother; Hypertension in his father and mother; Liver cancer in his maternal grandfather and paternal uncle; Stroke in his mother. Allergies  Allergen Reactions   Ambien [Zolpidem]     Unknown reaction   Bentyl [Dicyclomine]     Memory loss, anxiety, blurred vision   Carafate [Sucralfate] Nausea And Vomiting   Lansoprazole  Diarrhea   Lexapro [Escitalopram Oxalate]    Other     Pt states he is sensitive to all oral medications   Pneumovax [Pneumococcal Polysaccharide Vaccine] Other (See Comments)    pain   Prilosec [Omeprazole] Other (See Comments)    Per patient joint pain with PPI's   Remeron [Mirtazapine]    Seroquel [Quetiapine] Other (See Comments)    "terrible reaction"   Statins Other (See Comments)    Joint pain   Tizanidine Other (See Comments)    Made patient "feel crazy"   Tramadol Other (See Comments)    Can only take for 2 takes then he starts to have pain   Tylenol [Acetaminophen]     Can only take for 2 takes then he starts to have pain   Current Outpatient Medications on File Prior to Visit  Medication Sig Dispense Refill   cholecalciferol (VITAMIN D3) 25 MCG (1000 UNIT) tablet Take 1,000 Units by mouth daily.     Polyethyl Glycol-Propyl Glycol (SYSTANE OP) Place 1 drop into both eyes 2 (two) times daily as needed (dry/irritated eyes).     docusate sodium (COLACE) 100 MG capsule Take 1 capsule (100 mg total) by mouth 2 (two) times daily as needed for mild constipation. (Patient not taking: Reported on 11/07/2021) 30 capsule 1   famotidine (PEPCID) 40 MG tablet Take 1 tablet (40 mg total) by mouth 2 (two) times daily. (Patient not taking: Reported on 11/07/2021) 180 tablet 3   hydrOXYzine (ATARAX/VISTARIL) 10 MG tablet Take 1 tablet (10 mg total) by mouth at bedtime. (Patient not taking: Reported on 08/22/2021) 90 tablet 3   metoCLOPramide (REGLAN) 10 MG tablet Take 1 tablet (10 mg total) by mouth at bedtime. (Patient not taking: Reported on 08/22/2021) 30 tablet 1   pantoprazole (PROTONIX) 40 MG tablet Take 40 mg by mouth daily. (Patient not taking: Reported on 11/07/2021)     polyethylene glycol (MIRALAX / GLYCOLAX) 17 g packet Take 17 g by mouth daily. (Patient not taking: Reported on 11/07/2021) 14 each 0   No current facility-administered medications on file prior to visit.         ROS:  All others reviewed and negative.  Objective        PE:  BP 124/70 (BP Location: Right Arm, Patient Position: Sitting, Cuff Size: Large)    Pulse 66    Temp 98.6 F (37 C) (Oral)    Ht 5\' 7"  (1.702 m)    Wt 163 lb (73.9 kg)    SpO2 98%    BMI 25.53 kg/m  Constitutional: Pt appears in NAD               HENT: Head: NCAT.                Right Ear: External ear normal.                 Left Ear: External ear normal.                Eyes: . Pupils are equal, round, and reactive to light. Conjunctivae and EOM are normal               Nose: without d/c or deformity               Neck: Neck supple. Gross normal ROM               Cardiovascular: Normal rate and regular rhythm.                 Pulmonary/Chest: Effort normal and breath sounds without rales or wheezing.                Abd:  Soft, NT, ND, + BS, no organomegaly               Neurological: Pt is alert. At baseline orientation, motor grossly intact               Skin: Skin is warm. No rashes, no other new lesions, LE edema - none               Psychiatric: Pt behavior is normal without agitation but depressed anxious affect  Micro: none  Cardiac tracings I have personally interpreted today:  none  Pertinent Radiological findings (summarize): none   Lab Results  Component Value Date   WBC 16.7 (H) 09/10/2021   HGB 13.4 09/10/2021   HCT 39.8 09/10/2021   PLT 349 09/10/2021   GLUCOSE 158 (H) 09/09/2021   CHOL 163 06/30/2019   TRIG 132.0 06/30/2019   HDL 37.20 (L) 06/30/2019   LDLDIRECT 169.0 06/24/2018   LDLCALC 100 (H) 06/30/2019   ALT 10 04/21/2021   AST 12 04/21/2021   NA 133 (L) 09/09/2021   K 4.3 09/09/2021   CL 102 09/09/2021   CREATININE 1.22 09/09/2021   BUN 15 09/09/2021   CO2 21 (L) 09/09/2021   TSH 2.12 03/13/2021   PSA 0.00 (L) 06/30/2019   HGBA1C 5.9 (H) 09/10/2021   Hemoglobin A1C 4.0 - 5.6 % 5.5  5.9 High  R, CM  5.9 R, CM  6.1 R, CM  5.9 R, CM  6.1 R, CM  5.9    Assessment/Plan:   Erik Lolling Sr. is a 75 y.o. White or Caucasian [1] male with  has a past medical history of Allergic rhinitis, Allergy, Anal fissure, Anemia, Anxiety, Blood transfusion without reported diagnosis, Cataract, Coronary atherosclerosis of native coronary artery (2007), Degenerative arthritis of knee, bilateral (10/09/2017), Depression, Diverticulosis (12/18/2011), Diverticulosis of colon with hemorrhage, Fibromyalgia, GERD with stricture, Helicobacter pylori (H. 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  Insomnia Pt wants to focus on this first as primary issue, will restart elavil 100 qhs prn,  to f/u any worsening symptoms or concerns   Depression Not currently  taking SSRI such as paxil or celexa, declines for now and defers referral counseling  Right knee DJD S/p TKR right  - cont PT, f/u ortho, and tramadol prn  Diarrhea Benign exam, no blood or fever, ok for otc align,  to f/u any worsening symptoms or concerns  Followup: No follow-ups on file.  Cathlean Cower, MD 11/07/2021 11:11 AM [facility]

## 2021-11-07 NOTE — Assessment & Plan Note (Signed)
Not currently taking SSRI such as paxil or celexa, declines for now and defers referral counseling

## 2021-11-08 DIAGNOSIS — M25561 Pain in right knee: Secondary | ICD-10-CM | POA: Diagnosis not present

## 2021-11-08 DIAGNOSIS — M25511 Pain in right shoulder: Secondary | ICD-10-CM | POA: Diagnosis not present

## 2021-11-08 DIAGNOSIS — M25661 Stiffness of right knee, not elsewhere classified: Secondary | ICD-10-CM | POA: Diagnosis not present

## 2021-11-08 DIAGNOSIS — M6281 Muscle weakness (generalized): Secondary | ICD-10-CM | POA: Diagnosis not present

## 2021-11-08 DIAGNOSIS — M25512 Pain in left shoulder: Secondary | ICD-10-CM | POA: Diagnosis not present

## 2021-11-10 DIAGNOSIS — M25511 Pain in right shoulder: Secondary | ICD-10-CM | POA: Diagnosis not present

## 2021-11-10 DIAGNOSIS — M6281 Muscle weakness (generalized): Secondary | ICD-10-CM | POA: Diagnosis not present

## 2021-11-10 DIAGNOSIS — M25661 Stiffness of right knee, not elsewhere classified: Secondary | ICD-10-CM | POA: Diagnosis not present

## 2021-11-10 DIAGNOSIS — M25512 Pain in left shoulder: Secondary | ICD-10-CM | POA: Diagnosis not present

## 2021-11-10 DIAGNOSIS — M25561 Pain in right knee: Secondary | ICD-10-CM | POA: Diagnosis not present

## 2021-11-14 DIAGNOSIS — M25512 Pain in left shoulder: Secondary | ICD-10-CM | POA: Diagnosis not present

## 2021-11-14 DIAGNOSIS — M25561 Pain in right knee: Secondary | ICD-10-CM | POA: Diagnosis not present

## 2021-11-14 DIAGNOSIS — M25511 Pain in right shoulder: Secondary | ICD-10-CM | POA: Diagnosis not present

## 2021-11-14 DIAGNOSIS — M25661 Stiffness of right knee, not elsewhere classified: Secondary | ICD-10-CM | POA: Diagnosis not present

## 2021-11-14 DIAGNOSIS — M6281 Muscle weakness (generalized): Secondary | ICD-10-CM | POA: Diagnosis not present

## 2021-11-15 ENCOUNTER — Encounter: Payer: Self-pay | Admitting: Internal Medicine

## 2021-11-16 DIAGNOSIS — M25561 Pain in right knee: Secondary | ICD-10-CM | POA: Diagnosis not present

## 2021-11-16 DIAGNOSIS — M25512 Pain in left shoulder: Secondary | ICD-10-CM | POA: Diagnosis not present

## 2021-11-16 DIAGNOSIS — M25511 Pain in right shoulder: Secondary | ICD-10-CM | POA: Diagnosis not present

## 2021-11-16 DIAGNOSIS — M6281 Muscle weakness (generalized): Secondary | ICD-10-CM | POA: Diagnosis not present

## 2021-11-16 DIAGNOSIS — M25661 Stiffness of right knee, not elsewhere classified: Secondary | ICD-10-CM | POA: Diagnosis not present

## 2021-11-17 DIAGNOSIS — H2513 Age-related nuclear cataract, bilateral: Secondary | ICD-10-CM | POA: Diagnosis not present

## 2021-11-17 DIAGNOSIS — H40003 Preglaucoma, unspecified, bilateral: Secondary | ICD-10-CM | POA: Diagnosis not present

## 2021-11-21 DIAGNOSIS — M25512 Pain in left shoulder: Secondary | ICD-10-CM | POA: Diagnosis not present

## 2021-11-21 DIAGNOSIS — M6281 Muscle weakness (generalized): Secondary | ICD-10-CM | POA: Diagnosis not present

## 2021-11-21 DIAGNOSIS — M25661 Stiffness of right knee, not elsewhere classified: Secondary | ICD-10-CM | POA: Diagnosis not present

## 2021-11-21 DIAGNOSIS — M25561 Pain in right knee: Secondary | ICD-10-CM | POA: Diagnosis not present

## 2021-11-21 DIAGNOSIS — M25511 Pain in right shoulder: Secondary | ICD-10-CM | POA: Diagnosis not present

## 2021-11-23 DIAGNOSIS — M25561 Pain in right knee: Secondary | ICD-10-CM | POA: Diagnosis not present

## 2021-11-23 DIAGNOSIS — M25511 Pain in right shoulder: Secondary | ICD-10-CM | POA: Diagnosis not present

## 2021-11-23 DIAGNOSIS — M25661 Stiffness of right knee, not elsewhere classified: Secondary | ICD-10-CM | POA: Diagnosis not present

## 2021-11-23 DIAGNOSIS — M25512 Pain in left shoulder: Secondary | ICD-10-CM | POA: Diagnosis not present

## 2021-11-23 DIAGNOSIS — M6281 Muscle weakness (generalized): Secondary | ICD-10-CM | POA: Diagnosis not present

## 2021-11-27 ENCOUNTER — Encounter: Payer: Self-pay | Admitting: Internal Medicine

## 2021-11-27 DIAGNOSIS — Z932 Ileostomy status: Secondary | ICD-10-CM | POA: Diagnosis not present

## 2021-11-28 DIAGNOSIS — M6281 Muscle weakness (generalized): Secondary | ICD-10-CM | POA: Diagnosis not present

## 2021-11-28 DIAGNOSIS — M25511 Pain in right shoulder: Secondary | ICD-10-CM | POA: Diagnosis not present

## 2021-11-28 DIAGNOSIS — M25661 Stiffness of right knee, not elsewhere classified: Secondary | ICD-10-CM | POA: Diagnosis not present

## 2021-11-28 DIAGNOSIS — M25561 Pain in right knee: Secondary | ICD-10-CM | POA: Diagnosis not present

## 2021-11-28 DIAGNOSIS — M25512 Pain in left shoulder: Secondary | ICD-10-CM | POA: Diagnosis not present

## 2021-11-28 MED ORDER — CITALOPRAM HYDROBROMIDE 10 MG PO TABS: 10.0000 mg | ORAL_TABLET | Freq: Every day | ORAL | 3 refills | Status: DC

## 2021-11-30 DIAGNOSIS — M25512 Pain in left shoulder: Secondary | ICD-10-CM | POA: Diagnosis not present

## 2021-11-30 DIAGNOSIS — M25511 Pain in right shoulder: Secondary | ICD-10-CM | POA: Diagnosis not present

## 2021-11-30 DIAGNOSIS — M6281 Muscle weakness (generalized): Secondary | ICD-10-CM | POA: Diagnosis not present

## 2021-11-30 DIAGNOSIS — M25561 Pain in right knee: Secondary | ICD-10-CM | POA: Diagnosis not present

## 2021-11-30 DIAGNOSIS — M25661 Stiffness of right knee, not elsewhere classified: Secondary | ICD-10-CM | POA: Diagnosis not present

## 2021-12-05 DIAGNOSIS — M25512 Pain in left shoulder: Secondary | ICD-10-CM | POA: Diagnosis not present

## 2021-12-05 DIAGNOSIS — M6281 Muscle weakness (generalized): Secondary | ICD-10-CM | POA: Diagnosis not present

## 2021-12-05 DIAGNOSIS — M25661 Stiffness of right knee, not elsewhere classified: Secondary | ICD-10-CM | POA: Diagnosis not present

## 2021-12-05 DIAGNOSIS — M25511 Pain in right shoulder: Secondary | ICD-10-CM | POA: Diagnosis not present

## 2021-12-05 DIAGNOSIS — M25561 Pain in right knee: Secondary | ICD-10-CM | POA: Diagnosis not present

## 2021-12-07 ENCOUNTER — Encounter: Payer: Self-pay | Admitting: Internal Medicine

## 2021-12-07 ENCOUNTER — Other Ambulatory Visit: Payer: Medicare Other

## 2021-12-07 ENCOUNTER — Ambulatory Visit: Payer: Medicare Other | Admitting: Internal Medicine

## 2021-12-07 VITALS — BP 130/80 | HR 60 | Ht 67.0 in | Wt 164.0 lb

## 2021-12-07 DIAGNOSIS — M6281 Muscle weakness (generalized): Secondary | ICD-10-CM | POA: Diagnosis not present

## 2021-12-07 DIAGNOSIS — R197 Diarrhea, unspecified: Secondary | ICD-10-CM | POA: Diagnosis not present

## 2021-12-07 DIAGNOSIS — N9489 Other specified conditions associated with female genital organs and menstrual cycle: Secondary | ICD-10-CM

## 2021-12-07 DIAGNOSIS — M25661 Stiffness of right knee, not elsewhere classified: Secondary | ICD-10-CM | POA: Diagnosis not present

## 2021-12-07 DIAGNOSIS — K580 Irritable bowel syndrome with diarrhea: Secondary | ICD-10-CM | POA: Diagnosis not present

## 2021-12-07 DIAGNOSIS — M25561 Pain in right knee: Secondary | ICD-10-CM | POA: Diagnosis not present

## 2021-12-07 DIAGNOSIS — M25512 Pain in left shoulder: Secondary | ICD-10-CM | POA: Diagnosis not present

## 2021-12-07 DIAGNOSIS — M25511 Pain in right shoulder: Secondary | ICD-10-CM | POA: Diagnosis not present

## 2021-12-07 NOTE — Patient Instructions (Signed)

## 2021-12-07 NOTE — Progress Notes (Signed)
Erik MCELHANNON Sr. 76 y.o. February 01, 1946 [id number]  Assessment & Plan:   Encounter Diagnoses  Name Primary?   Diarrhea, unspecified type Yes   High-tone pelvic floor dysfunction    Irritable bowel syndrome with diarrhea    I suspect this is his persistent high tone pelvic floor dysfunction plus or minus IBS.  I am going to check a fecal calprotectin to see if there is any inflammatory component.  I offered therapy but he does not want to "mask" any problem.  I suspect retrying pelvic floor physical therapy will be the recommendation.  Beyond that he has been through multiple tricyclic agents and other pain medications etc. other neuromodulators.  I know this is frustrating for him but I do not know what else I have to offer. 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  He has had these problems many times in the past.  He tends to have postprandial stools that tend to be watery.  They "burned like battery acid".  He has been to [name] who told him now that the bladder is out he does not "own any of this".  That is with respect to his pain.  He has persistent burning pains in the pelvic area also.  There is also 10 out of 10 stabbing pelvic pain at times.  There is no rectal bleeding or fever.  Recent procedural history notable for total knee replacement in October 2022, colonoscopy with GI bleeding problem at [facility] 2021 was negative.   Allergies  Allergen Reactions   Ambien [Zolpidem]     Unknown reaction   Bentyl [Dicyclomine]     Memory loss, anxiety, blurred vision   Carafate [Sucralfate] Nausea And Vomiting   Lansoprazole Diarrhea   Lexapro [Escitalopram  Oxalate]    Other     Pt states he is sensitive to all oral medications   Pneumovax [Pneumococcal Polysaccharide Vaccine] Other (See Comments)    pain   Prilosec [Omeprazole] Other (See Comments)    Per patient joint pain with PPI's   Remeron [Mirtazapine]    Seroquel [Quetiapine] Other (See Comments)    "terrible reaction"   Statins Other (See Comments)    Joint pain   Tizanidine Other (See Comments)    Made patient "feel crazy"   Tramadol Other (See Comments)    Can only take for 2 takes then he starts to have pain   Tylenol [Acetaminophen]     Can only take for 2 takes then he starts to have pain   Current Meds  Medication Sig   cholecalciferol (VITAMIN D3) 25 MCG (1000 UNIT) tablet Take 1,000 Units by mouth daily.   citalopram (CELEXA) 10 MG tablet Take 1 tablet (10 mg total) by mouth daily.   famotidine (PEPCID) 20 MG tablet Take 20 mg by mouth daily.   LORazepam (ATIVAN) 1 MG tablet Take 1 tablet (1 mg total) by mouth 2 (two) times daily as needed for anxiety. (Patient taking differently: Take 0.5 mg by mouth at bedtime.)   Polyethyl Glycol-Propyl Glycol (SYSTANE OP) Place 1 drop into both eyes 2 (two) times daily as needed (dry/irritated eyes).   traMADol (ULTRAM) 50 MG tablet TAKE 1 TABLET BY MOUTH TWICE A DAY  AS NEEDED (Patient taking differently: 25 mg. TAKE 1 TABLET BY MOUTH TWICE A DAY AS NEEDED)   Past Medical History:  Diagnosis Date   Allergic rhinitis    Allergy    Anal fissure    Anemia    Anxiety    Blood transfusion without reported diagnosis    Cataract    starting   Coronary atherosclerosis of native coronary artery 2007   nonobstructive CAD by cath with 40% mid-LAD stenosis   Degenerative arthritis of knee, bilateral 10/09/2017   Depression    Diverticulosis 12/18/2011   Diverticulosis of colon with hemorrhage    April 2021, [facility] healthcare   Fibromyalgia    GERD with stricture    Helicobacter pylori (H. 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   Vitamin D deficiency 11/29/2017   Past Surgical History:  Procedure Laterality Date   bladder distention     x 6   CATHETER REMOVAL     super pubic area   COLONOSCOPY     DENTAL SURGERY Right    #30 tooth extraction (right upper)   INTERSTIM IMPLANT PLACEMENT     INTERSTIM IMPLANT REMOVAL     KNEE ARTHROSCOPY     right x 2   NISSEN FUNDOPLICATION  6578   PROSTECTOMY     ROBOT ASSISTED LAPAROSCOPIC COMPLETE CYSTECT ILEAL CONDUIT     SHOULDER ARTHROSCOPY  2010   left   TONSILLECTOMY AND ADENOIDECTOMY  1957   TOTAL KNEE ARTHROPLASTY Right 09/08/2021   Procedure: TOTAL KNEE ARTHROPLASTY;  Surgeon: Susa Day, MD;  Location: [facility];  Service: Orthopedics;  Laterality: Right;   TRANSURETHRAL RESECTION OF PROSTATE     x 2   UPPER GASTROINTESTINAL ENDOSCOPY  05/13/2008   hiatal hernia   VASECTOMY     Social History   Social History Narrative   Married, retired for children   He is a former smoker no alcohol or substance use   family history includes Arthritis in his mother; Breast cancer in his sister; Colon polyps in his father; Diabetes in his father, paternal uncle, and sister; Heart disease in his father and mother; Hypertension in his father and mother; Liver cancer in his maternal grandfather and paternal uncle; Stroke in his mother.   Review of Systems As above  Objective:   Physical Exam BP 130/80    Pulse 60    Ht 5\' 7"  (1.702 m)    Wt 164 lb (74.4 kg)     BMI 25.69 kg/m  Abd soft and NT - uretrostomy RLQ

## 2021-12-08 ENCOUNTER — Other Ambulatory Visit: Payer: Self-pay

## 2021-12-08 ENCOUNTER — Ambulatory Visit (INDEPENDENT_AMBULATORY_CARE_PROVIDER_SITE_OTHER): Payer: Medicare Other

## 2021-12-08 DIAGNOSIS — Z0000 Encounter for general adult medical examination without abnormal findings: Secondary | ICD-10-CM

## 2021-12-08 NOTE — Patient Instructions (Signed)
[name] , Thank you for taking time to come for your Medicare Wellness Visit. I appreciate your ongoing commitment to your health goals. Please review the following plan we discussed and let me know if I can assist you in the future.   Screening recommendations/referrals: Colonoscopy: 02/22/2020 Recommended yearly ophthalmology/optometry visit for glaucoma screening and checkup Recommended yearly dental visit for hygiene and checkup  Vaccinations: Influenza vaccine: Allergy Pneumococcal vaccine: Allergy Tdap vaccine: 08/23/2014 Shingles vaccine: will discuss with PCP     Advanced directives: yes  Conditions/risks identified: Abdominal pain with N&V made appointment with PCP  Next appointment: 12/12/2021  0140pm with Dr. Jenny Reichmann  Preventive Care 38 Years and Older, Male Preventive care refers to lifestyle choices and visits with your health care provider that can promote health and wellness. What does preventive care include? A yearly physical exam. This is also called an annual well check. Dental exams once or twice a year. Routine eye exams. Ask your health care provider how often you should have your eyes checked. Personal lifestyle choices, including: Daily care of your teeth and gums. Regular physical activity. Eating a healthy diet. Avoiding tobacco and drug use. Limiting alcohol use. Practicing safe sex. Taking low doses of aspirin every day. Taking vitamin and mineral supplements as recommended by your health care provider. What happens during an annual well check? The services and screenings done by your health care provider during your annual well check will depend on your age, overall health, lifestyle risk factors, and family history of disease. Counseling  Your health care provider may ask you questions about your: Alcohol use. Tobacco use. Drug use. Emotional well-being. Home and relationship well-being. Sexual activity. Eating habits. History of  falls. Memory and ability to understand (cognition). Work and work Statistician. Screening  You may have the following tests or measurements: Height, weight, and BMI. Blood pressure. Lipid and cholesterol levels. These may be checked every 5 years, or more frequently if you are over 31 years old. Skin check. Lung cancer screening. You may have this screening every year starting at age 36 if you have a 30-pack-year history of smoking and currently smoke or have quit within the past 15 years. Fecal occult blood test (FOBT) of the stool. You may have this test every year starting at age 42. Flexible sigmoidoscopy or colonoscopy. You may have a sigmoidoscopy every 5 years or a colonoscopy every 10 years starting at age 18. Prostate cancer screening. Recommendations will vary depending on your family history and other risks. Hepatitis C blood test. Hepatitis B blood test. Sexually transmitted disease (STD) testing. Diabetes screening. This is done by checking your blood sugar (glucose) after you have not eaten for a while (fasting). You may have this done every 1-3 years. Abdominal aortic aneurysm (AAA) screening. You may need this if you are a current or former smoker. Osteoporosis. You may be screened starting at age 68 if you are at high risk. Talk with your health care provider about your test results, treatment options, and if necessary, the need for more tests. Vaccines  Your health care provider may recommend certain vaccines, such as: Influenza vaccine. This is recommended every year. Tetanus, diphtheria, and acellular pertussis (Tdap, Td) vaccine. You may need a Td booster every 10 years. Zoster vaccine. You may need this after age 65. Pneumococcal 13-valent conjugate (PCV13) vaccine. One dose is recommended after age 15. Pneumococcal polysaccharide (PPSV23) vaccine. One dose is recommended after age 11. Talk to your health care provider about which  screenings and vaccines you need and  how often you need them. This information is not intended to replace advice given to you by your health care provider. Make sure you discuss any questions you have with your health care provider. Document Released: 11/25/2015 Document Revised: 07/18/2016 Document Reviewed: 08/30/2015 Elsevier Interactive Patient Education  2017 [place] Prevention in the Home Falls can cause injuries. They can happen to people of all ages. There are many things you can do to make your home safe and to help prevent falls. What can I do on the outside of my home? Regularly fix the edges of walkways and driveways and fix any cracks. Remove anything that might make you trip as you walk through a door, such as a raised step or threshold. Trim any bushes or trees on the path to your home. Use bright outdoor lighting. Clear any walking paths of anything that might make someone trip, such as rocks or tools. Regularly check to see if handrails are loose or broken. Make sure that both sides of any steps have handrails. Any raised decks and porches should have guardrails on the edges. Have any leaves, snow, or ice cleared regularly. Use sand or salt on walking paths during winter. Clean up any spills in your garage right away. This includes oil or grease spills. What can I do in the bathroom? Use night lights. Install grab bars by the toilet and in the tub and shower. Do not use towel bars as grab bars. Use non-skid mats or decals in the tub or shower. If you need to sit down in the shower, use a plastic, non-slip stool. Keep the floor dry. Clean up any water that spills on the floor as soon as it happens. Remove soap buildup in the tub or shower regularly. Attach bath mats securely with double-sided non-slip rug tape. Do not have throw rugs and other things on the floor that can make you trip. What can I do in the bedroom? Use night lights. Make sure that you have a light by your bed that is easy to  reach. Do not use any sheets or blankets that are too big for your bed. They should not hang down onto the floor. Have a firm chair that has side arms. You can use this for support while you get dressed. Do not have throw rugs and other things on the floor that can make you trip. What can I do in the kitchen? Clean up any spills right away. Avoid walking on wet floors. Keep items that you use a lot in easy-to-reach places. If you need to reach something above you, use a strong step stool that has a grab bar. Keep electrical cords out of the way. Do not use floor polish or wax that makes floors slippery. If you must use wax, use non-skid floor wax. Do not have throw rugs and other things on the floor that can make you trip. What can I do with my stairs? Do not leave any items on the stairs. Make sure that there are handrails on both sides of the stairs and use them. Fix handrails that are broken or loose. Make sure that handrails are as long as the stairways. Check any carpeting to make sure that it is firmly attached to the stairs. Fix any carpet that is loose or worn. Avoid having throw rugs at the top or bottom of the stairs. If you do have throw rugs, attach them to the floor with carpet  tape. Make sure that you have a light switch at the top of the stairs and the bottom of the stairs. If you do not have them, ask someone to add them for you. What else can I do to help prevent falls? Wear shoes that: Do not have high heels. Have rubber bottoms. Are comfortable and fit you well. Are closed at the toe. Do not wear sandals. If you use a stepladder: Make sure that it is fully opened. Do not climb a closed stepladder. Make sure that both sides of the stepladder are locked into place. Ask someone to hold it for you, if possible. Clearly mark and make sure that you can see: Any grab bars or handrails. First and last steps. Where the edge of each step is. Use tools that help you move  around (mobility aids) if they are needed. These include: Canes. Walkers. Scooters. Crutches. Turn on the lights when you go into a dark area. Replace any light bulbs as soon as they burn out. Set up your furniture so you have a clear path. Avoid moving your furniture around. If any of your floors are uneven, fix them. If there are any pets around you, be aware of where they are. Review your medicines with your doctor. Some medicines can make you feel dizzy. This can increase your chance of falling. Ask your doctor what other things that you can do to help prevent falls. This information is not intended to replace advice given to you by your health care provider. Make sure you discuss any questions you have with your health care provider. Document Released: 08/25/2009 Document Revised: 04/05/2016 Document Reviewed: 12/03/2014 Elsevier Interactive Patient Education  2017 [employer].

## 2021-12-08 NOTE — Progress Notes (Signed)
Subjective:   Erik Lolling Sr. is a 76 y.o. male who presents for an Subsequent Medicare Annual Wellness Visit.   I connected with Jairon Ripberger today by telephone and verified that I am speaking with the correct person using two identifiers. Location patient: home Location provider: work Persons participating in the virtual visit: patient, provider.   I discussed the limitations, risks, security and privacy concerns of performing an evaluation and management service by telephone and the availability of in person appointments. I also discussed with the patient that there may be a patient responsible charge related to this service. The patient expressed understanding and verbally consented to this telephonic visit.    Interactive audio and video telecommunications were attempted between this provider and patient, however failed, due to patient having technical difficulties OR patient did not have access to video capability.  We continued and completed visit with audio only.    Review of Systems     Cardiac Risk Factors include: advanced age (>29men, >58 women);male gender     Objective:    Today's Vitals   12/08/21 1035  PainSc: 3    There is no height or weight on file to calculate BMI.  Advanced Directives 12/08/2021 09/09/2021 09/08/2021 09/08/2021 08/31/2021 03/23/2021 10/18/2020  Does Patient Have a Medical Advance Directive? Yes Yes - Yes Yes Yes Yes  Type of Advance Directive [place];Living will [place];Living will [place];Living will [place];Living will [place];Living will Living will [place];Living will  Does patient want to make changes to medical advance directive? - No - Patient declined - - - No - Patient declined No - Patient declined  Copy of [place] in Chart? No - copy requested No - copy requested No - copy requested Yes -  validated most recent copy scanned in chart (See row information) No - copy requested - -  Would patient like information on creating a medical advance directive? - - - - - - -    Current Medications (verified) Outpatient Encounter Medications as of 12/08/2021  Medication Sig   cholecalciferol (VITAMIN D3) 25 MCG (1000 UNIT) tablet Take 1,000 Units by mouth daily.   famotidine (PEPCID) 20 MG tablet Take 20 mg by mouth daily.   LORazepam (ATIVAN) 1 MG tablet Take 1 tablet (1 mg total) by mouth 2 (two) times daily as needed for anxiety. (Patient taking differently: Take 0.5 mg by mouth at bedtime.)   Polyethyl Glycol-Propyl Glycol (SYSTANE OP) Place 1 drop into both eyes 2 (two) times daily as needed (dry/irritated eyes).   traMADol (ULTRAM) 50 MG tablet TAKE 1 TABLET BY MOUTH TWICE A DAY AS NEEDED (Patient taking differently: 25 mg. TAKE 1 TABLET BY MOUTH TWICE A DAY AS NEEDED)   citalopram (CELEXA) 10 MG tablet Take 1 tablet (10 mg total) by mouth daily. (Patient not taking: Reported on 12/08/2021)   No facility-administered encounter medications on file as of 12/08/2021.    Allergies (verified) Ambien [zolpidem], Bentyl [dicyclomine], Carafate [sucralfate], Lansoprazole, Lexapro [escitalopram oxalate], Other, Pneumovax [pneumococcal polysaccharide vaccine], Prilosec [omeprazole], Remeron [mirtazapine], Seroquel [quetiapine], Statins, Tizanidine, Tramadol, and Tylenol [acetaminophen]   History: Past Medical History:  Diagnosis Date   Allergic rhinitis    Allergy    Anal fissure    Anemia    Anxiety    Blood transfusion without reported diagnosis    Cataract    starting   Coronary atherosclerosis of  native coronary artery 2007   nonobstructive CAD by cath with 40% mid-LAD stenosis   Degenerative arthritis of knee, bilateral 10/09/2017   Depression    Diverticulosis 12/18/2011   Diverticulosis of colon with hemorrhage    April 2021, [facility]   Fibromyalgia    GERD with  stricture    Helicobacter pylori (H. 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   Vitamin D deficiency 11/29/2017   Past Surgical History:  Procedure Laterality Date   bladder distention     x 6   CATHETER REMOVAL     super pubic area   COLONOSCOPY     DENTAL SURGERY Right    #30 tooth extraction (right upper)   INTERSTIM IMPLANT PLACEMENT     INTERSTIM IMPLANT REMOVAL     KNEE ARTHROSCOPY     right x 2   NISSEN FUNDOPLICATION  7741   PROSTECTOMY     ROBOT ASSISTED LAPAROSCOPIC COMPLETE CYSTECT ILEAL CONDUIT     SHOULDER ARTHROSCOPY  2010   left   TONSILLECTOMY AND ADENOIDECTOMY  1957   TOTAL KNEE ARTHROPLASTY Right 09/08/2021   Procedure: TOTAL KNEE ARTHROPLASTY;  Surgeon: Susa Day, MD;  Location: [facility];  Service: Orthopedics;  Laterality: Right;   TRANSURETHRAL RESECTION OF PROSTATE     x 2   UPPER GASTROINTESTINAL ENDOSCOPY  05/13/2008   hiatal hernia   VASECTOMY     Family History  Problem Relation Age of Onset   Colon polyps Father    Diabetes Father    Heart disease Father    Hypertension Father    Heart disease Mother    Hypertension Mother    Stroke Mother    Arthritis Mother    Diabetes Sister    Breast cancer Sister    Diabetes Paternal Uncle    Liver cancer Paternal Uncle    Liver cancer Maternal Grandfather    Colon cancer Neg Hx    Esophageal cancer Neg Hx    Rectal cancer Neg Hx    Stomach cancer Neg Hx    Pancreatic cancer Neg Hx     Social History   Socioeconomic History   Marital status: Married    Spouse name: Not on file   Number of children: 4   Years of education: Not on file   Highest education level: Not on file  Occupational History   Occupation: retired  Tobacco Use   Smoking status: Former   Smokeless tobacco: Never   Tobacco comments:    quit 1980  Vaping Use   Vaping Use: Never used  Substance and Sexual Activity   Alcohol use: No    Alcohol/week: 0.0 standard drinks   Drug use: No   Sexual activity: Yes    Partners: Female  Other Topics Concern   Not on file  Social History Narrative   Married, retired for children   He is a former  smoker no alcohol or substance use   Social Determinants of Radio broadcast assistant Strain: Low Risk    Difficulty of Paying Living Expenses: Not hard at all  Food Insecurity: No Food Insecurity   Worried About Charity fundraiser in the Last Year: Never true   Arboriculturist in the Last Year: Never true  Transportation Needs: No Transportation Needs   Lack of Transportation (Medical): No   Lack of Transportation (Non-Medical): No  Physical Activity: Insufficiently Active   Days of Exercise per Week: 2 days   Minutes of Exercise per Session: 30 min  Stress: No Stress Concern Present   Feeling of Stress : Only a little  Social Connections: Moderately Integrated   Frequency of Communication with Friends and Family: Three times a week   Frequency of Social Gatherings with Friends and Family: Three times a week   Attends Religious Services: More than 4 times per year   Active Member of Clubs or Organizations: No   Attends Archivist Meetings: Never   Marital Status: Married    Tobacco Counseling Counseling given: Not Answered Tobacco comments: quit 1980   Clinical Intake:  Pre-visit preparation completed: Yes  Pain : 0-10 Pain Score: 3  Pain Type: Chronic pain Pain Location: Abdomen Pain Descriptors / Indicators:  Constant Pain Onset: Other (comment) Pain Frequency: Constant Pain Relieving Factors: Tramadol Effect of Pain on Daily Activities: yes  Pain Relieving Factors: Tramadol  Nutritional Risks: Other (Comment) (diarrhea treament [place]) Diabetes: No  How often do you need to have someone help you when you read instructions, pamphlets, or other written materials from your doctor or pharmacy?: 1 - Never What is the last grade level you completed in school?: GED  Diabetic?no   Interpreter Needed?: No  Information entered by :: L.Terril Amaro,LPN   Activities of Daily Living In your present state of health, do you have any difficulty performing the following activities: 12/08/2021 09/09/2021  Hearing? N -  Vision? N -  Difficulty concentrating or making decisions? N -  Comment - -  Walking or climbing stairs? N -  Comment - -  Dressing or bathing? N -  Doing errands, shopping? N N  Preparing Food and eating ? N -  Using the Toilet? N -  In the past six months, have you accidently leaked urine? N -  Do you have problems with loss of bowel control? N -  Managing your Medications? N -  Managing your Finances? N -  Housekeeping or managing your Housekeeping? N -  Some recent data might be hidden    Patient Care Team: [name], MD as PCP - General (Internal Medicine) [name], MD as Attending Physician (Internal Medicine) Domingo Pulse, MD (Urology) Phylliss Blakes, OD as Consulting Physician (Optometry)  Indicate any recent Medical Services you may have received from other than [facility] providers in the past year (date may be approximate).     Assessment:   This is a routine wellness examination for [name].  Hearing/Vision screen Vision Screening - Comments:: Annual eye exams wears glasses   Dietary issues and exercise activities discussed: Current Exercise Habits: Home exercise routine, Type of exercise: walking, Time (Minutes): 30, Frequency (Times/Week): 2, Weekly  Exercise (Minutes/Week): 60, Intensity: Mild, Exercise limited by: None identified   Goals Addressed             This Visit's Progress    Exercise 150 minutes per week (moderate activity)  On track    In general to be more active; Getting his sleep now and having energy;  Has been in aquatics in the past and may try again.  Start date Sept 1        Depression Screen PHQ 2/9 Scores 12/08/2021 12/08/2021 05/18/2021 03/13/2021 11/22/2020 10/18/2020 12/14/2019  PHQ - 2 Score 0 1 1 1 1 1 3   PHQ- 9 Score - - - - - - 9    Fall Risk Fall Risk  12/08/2021 05/18/2021 03/13/2021 11/22/2020 10/18/2020  Falls in the past year? 0 1 1 0 0  Number falls in past yr: 0 0 0 - 0  Injury with Fall? 0 0 0 - 0  Risk for fall due to : No Fall Risks - - - No Fall Risks  Follow up Falls evaluation completed - - - Falls evaluation completed    FALL RISK PREVENTION PERTAINING TO THE HOME:  Any stairs in or around the home? No  If so, are there any without handrails? No  Home free of loose throw rugs in walkways, pet beds, electrical cords, etc? Yes  Adequate lighting in your home to reduce risk of falls? Yes   ASSISTIVE DEVICES UTILIZED TO PREVENT FALLS:  Life alert? No  Use of a cane, walker or w/c? No  Grab bars in the bathroom? Yes  Shower chair or bench in shower? No  Elevated toilet seat or a handicapped toilet? No     Cognitive Function:    Normal cognitive status assessed by direct observation by this Nurse Health Advisor. No abnormalities found.      Immunizations Immunization History  Administered Date(s) Administered   [employer] Vaccination 01/11/2020, 02/08/2020   Pneumococcal Polysaccharide-23 07/03/2013   Pneumococcal-Unspecified 11/12/2012   Tdap 08/23/2014    TDAP status: Up to date  Flu Vaccine status: Declined, Education has been provided regarding the importance of this vaccine but patient still declined. Advised may receive this vaccine at local pharmacy or Health  Dept. Aware to provide a copy of the vaccination record if obtained from local pharmacy or Health Dept. Verbalized acceptance and understanding.  Pneumococcal vaccine status: Declined,  Education has been provided regarding the importance of this vaccine but patient still declined. Advised may receive this vaccine at local pharmacy or Health Dept. Aware to provide a copy of the vaccination record if obtained from local pharmacy or Health Dept. Verbalized acceptance and understanding.   Covid-19 vaccine status: Completed vaccines  Qualifies for Shingles Vaccine? Yes   Zostavax completed No   Shingrix Completed?: No.    Education has been provided regarding the importance of this vaccine. Patient has been advised to call insurance company to determine out of pocket expense if they have not yet received this vaccine. Advised may also receive vaccine at local pharmacy or Health Dept. Verbalized acceptance and understanding.  Screening Tests Health Maintenance  Topic Date Due   Zoster Vaccines- Shingrix (1 of 2) Never done   Pneumonia Vaccine 50+ Years old (2 - PCV) 07/03/2014   COVID-19 Vaccine (3 - Moderna risk series) 03/07/2020   INFLUENZA VACCINE  Never done   TETANUS/TDAP  08/23/2024   COLONOSCOPY (Pts 45-83yrs Insurance coverage will need to be confirmed)  02/21/2030   Hepatitis C Screening  Completed   HPV VACCINES  Aged Out    Health Maintenance  Health Maintenance Due  Topic Date Due   Zoster Vaccines- Shingrix (1 of 2) Never done   Pneumonia Vaccine 57+ Years old (  2 - PCV) 07/03/2014   COVID-19 Vaccine (3 - Moderna risk series) 03/07/2020   INFLUENZA VACCINE  Never done    Colorectal cancer screening: Type of screening: Colonoscopy. Completed 02/22/2020. Repeat every 10 years  Lung Cancer Screening: (Low Dose CT Chest recommended if Age 63-80 years, 30 pack-year currently smoking OR have quit w/in 15years.) does not qualify.   Lung Cancer Screening Referral:  n/a  Additional Screening:  Hepatitis C Screening: does not qualify; Completed 07/26/2016  Vision Screening: Recommended annual ophthalmology exams for early detection of glaucoma and other disorders of the eye. Is the patient up to date with their annual eye exam?  Yes  Who is the provider or what is the name of the office in which the patient attends annual eye exams? Dr.hager  If pt is not established with a provider, would they like to be referred to a provider to establish care? No .   Dental Screening: Recommended annual dental exams for proper oral hygiene  Community Resource Referral / Chronic Care Management: CRR required this visit?  No   CCM required this visit?  No      Plan:     I have personally reviewed and noted the following in the patients chart:   Medical and social history Use of alcohol, tobacco or illicit drugs  Current medications and supplements including opioid prescriptions. Patient is currently taking opioid prescriptions. Information provided to patient regarding non-opioid alternatives. Patient advised to discuss non-opioid treatment plan with their provider. Functional ability and status Nutritional status Physical activity Advanced directives List of other physicians Hospitalizations, surgeries, and ER visits in previous 12 months Vitals Screenings to include cognitive, depression, and falls Referrals and appointments  In addition, I have reviewed and discussed with patient certain preventive protocols, quality metrics, and best practice recommendations. A written personalized care plan for preventive services as well as general preventive health recommendations were provided to patient.     Randel Pigg, LPN   05/10/4764   Nurse Notes: Abdominal pain with N&V made appointment with PCP

## 2021-12-11 ENCOUNTER — Other Ambulatory Visit: Payer: Medicare Other

## 2021-12-11 DIAGNOSIS — R197 Diarrhea, unspecified: Secondary | ICD-10-CM | POA: Diagnosis not present

## 2021-12-12 ENCOUNTER — Other Ambulatory Visit: Payer: Self-pay

## 2021-12-12 ENCOUNTER — Ambulatory Visit (INDEPENDENT_AMBULATORY_CARE_PROVIDER_SITE_OTHER): Payer: Medicare Other | Admitting: Internal Medicine

## 2021-12-12 ENCOUNTER — Encounter: Payer: Self-pay | Admitting: Internal Medicine

## 2021-12-12 DIAGNOSIS — F5101 Primary insomnia: Secondary | ICD-10-CM

## 2021-12-12 DIAGNOSIS — E559 Vitamin D deficiency, unspecified: Secondary | ICD-10-CM | POA: Diagnosis not present

## 2021-12-12 DIAGNOSIS — R1030 Lower abdominal pain, unspecified: Secondary | ICD-10-CM | POA: Diagnosis not present

## 2021-12-12 MED ORDER — ESZOPICLONE 2 MG PO TABS: 2.0000 mg | ORAL_TABLET | Freq: Every evening | ORAL | 1 refills | Status: DC | PRN

## 2021-12-12 MED ORDER — TRAMADOL HCL 50 MG PO TABS: ORAL_TABLET | ORAL | 2 refills | Status: DC

## 2021-12-12 MED ORDER — PANTOPRAZOLE SODIUM 40 MG PO TBEC: 40.0000 mg | DELAYED_RELEASE_TABLET | Freq: Every day | ORAL | 3 refills | Status: DC

## 2021-12-12 NOTE — Assessment & Plan Note (Signed)
Has tried multiple agents in past, ok for trial lunesta 2 mg qhs prn

## 2021-12-12 NOTE — Assessment & Plan Note (Signed)
Last vitamin D Lab Results  Component Value Date   VD25OH 36.16 11/10/2020   Low, to start oral replacement

## 2021-12-12 NOTE — Assessment & Plan Note (Signed)
Difficult historian, for trial protonix, tramadol prn, f/u Dr Carlean Purl as planned, Consider CT abd/pelvis, declines further lab today

## 2021-12-12 NOTE — Progress Notes (Signed)
Patient ID: Erik Lolling Sr., male   DOB: [birth year], 76 y.o.   MRN: [id number]        Chief Complaint:  Chief Complaint  Patient presents with   Office Visit    Burning lower abdominal pain and nausea, diarrhea, fatigue, dizziness, blurred vision, body aches,          HPI:  Erik MARCOTT Sr. is a 76 y.o. male here with c/o above for 2.5 yrs, no vomiting s/p nissen.  Has seen Gi, has known pelvic floor dysfxn.  Pt denies chest pain, increased sob or doe, wheezing, orthopnea, PND, increased LE swelling, palpitations, dizziness or syncope.   Pt denies polydipsia, polyuria, or new focal neuro s/s  Denies worsening depressive symptoms, suicidal ideation, or panic; has ongoing persistent chronic insomnia where just cannot get to sleep.           Wt Readings from Last 3 Encounters:  12/12/21 165 lb (74.8 kg)  12/07/21 164 lb (74.4 kg)  11/07/21 163 lb (73.9 kg)   BP Readings from Last 3 Encounters:  12/12/21 126/74  12/07/21 130/80  11/07/21 124/70         Past Medical History:  Diagnosis Date   Allergic rhinitis    Allergy    Anal fissure    Anemia    Anxiety    Blood transfusion without reported diagnosis    Cataract    starting   Coronary atherosclerosis of native coronary artery 2007   nonobstructive CAD by cath with 40% mid-LAD stenosis   Degenerative arthritis of knee, bilateral 10/09/2017   Depression    Diverticulosis 12/18/2011   Diverticulosis of colon with hemorrhage    April 2021, [facility]   Fibromyalgia    GERD with stricture    Helicobacter pylori (H. 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   Vitamin D deficiency 11/29/2017   Past Surgical History:  Procedure Laterality Date   bladder distention     x 6   CATHETER REMOVAL     super pubic area   COLONOSCOPY     DENTAL SURGERY Right    #30 tooth extraction (right upper)   INTERSTIM IMPLANT PLACEMENT     INTERSTIM IMPLANT REMOVAL     KNEE ARTHROSCOPY     right x 2   NISSEN FUNDOPLICATION  3557   PROSTECTOMY     ROBOT ASSISTED LAPAROSCOPIC COMPLETE CYSTECT ILEAL CONDUIT     SHOULDER ARTHROSCOPY  2010   left   TONSILLECTOMY AND ADENOIDECTOMY  1957   TOTAL KNEE ARTHROPLASTY Right 09/08/2021   Procedure: TOTAL KNEE ARTHROPLASTY;  Surgeon: Susa Day, MD;  Location: [facility];  Service: Orthopedics;  Laterality: Right;   TRANSURETHRAL RESECTION OF PROSTATE     x 2   UPPER GASTROINTESTINAL ENDOSCOPY  05/13/2008   hiatal hernia   VASECTOMY      reports that he has quit  smoking. He has never used smokeless tobacco. He reports that he does not drink alcohol and does not use drugs. family history includes Arthritis in his mother; Breast cancer in his sister; Colon polyps in his father; Diabetes in his father, paternal uncle, and sister; Heart disease in his father and mother; Hypertension in his father and mother; Liver cancer in his maternal grandfather and paternal uncle; Stroke in his mother. Allergies  Allergen Reactions   Ambien [Zolpidem]     Unknown reaction   Bentyl [Dicyclomine]     Memory loss, anxiety, blurred vision   Carafate [Sucralfate] Nausea And Vomiting   Lansoprazole Diarrhea   Lexapro [Escitalopram Oxalate]    Other     Pt states he is sensitive to all oral medications   Pneumovax [Pneumococcal Polysaccharide Vaccine] Other (See Comments)    pain   Prilosec [Omeprazole] Other (See Comments)    Per patient joint pain with PPI's   Remeron  [Mirtazapine]    Seroquel [Quetiapine] Other (See Comments)    "terrible reaction"   Statins Other (See Comments)    Joint pain   Tizanidine Other (See Comments)    Made patient "feel crazy"   Tramadol Other (See Comments)    Can only take for 2 takes then he starts to have pain   Tylenol [Acetaminophen]     Can only take for 2 takes then he starts to have pain   Current Outpatient Medications on File Prior to Visit  Medication Sig Dispense Refill   cholecalciferol (VITAMIN D3) 25 MCG (1000 UNIT) tablet Take 1,000 Units by mouth daily.     famotidine (PEPCID) 20 MG tablet Take 20 mg by mouth daily.     LORazepam (ATIVAN) 1 MG tablet Take 1 tablet (1 mg total) by mouth 2 (two) times daily as needed for anxiety. (Patient taking differently: Take 0.5 mg by mouth at bedtime.) 60 tablet 1   Polyethyl Glycol-Propyl Glycol (SYSTANE OP) Place 1 drop into both eyes 2 (two) times daily as needed (dry/irritated eyes).     citalopram (CELEXA) 10 MG tablet Take 1 tablet (10 mg total) by mouth daily. (Patient not taking: Reported on 12/08/2021) 90 tablet 3   No current facility-administered medications on file prior to visit.        ROS:  All others reviewed and negative.  Objective        PE:  BP 126/74 (BP Location: Left Arm, Patient Position: Sitting, Cuff Size: Large)    Pulse 95    Temp 99.1 F (37.3 C) (Oral)    Ht 5\' 7"  (1.702 m)    Wt 165 lb (74.8 kg)    SpO2 98%    BMI 25.84 kg/m                 Constitutional: Pt appears in NAD               HENT: Head: NCAT.                Right Ear: External ear normal.                 Left Ear: External ear normal.                Eyes: . Pupils are equal, round, and reactive to light. Conjunctivae and EOM are normal               Nose: without d/c or deformity  Neck: Neck supple. Gross normal ROM               Cardiovascular: Normal rate and regular rhythm.                 Pulmonary/Chest: Effort normal and breath sounds without rales  or wheezing.                Abd:  Soft, NT, ND, + BS, no organomegaly with iliostomy RLQ               Neurological: Pt is alert. At baseline orientation, motor grossly intact               Skin: Skin is warm. No rashes, no other new lesions, LE edema - none               Psychiatric: Pt behavior is normal without agitation   Micro: none  Cardiac tracings I have personally interpreted today:  none  Pertinent Radiological findings (summarize): none   Lab Results  Component Value Date   WBC 16.7 (H) 09/10/2021   HGB 13.4 09/10/2021   HCT 39.8 09/10/2021   PLT 349 09/10/2021   GLUCOSE 158 (H) 09/09/2021   CHOL 163 06/30/2019   TRIG 132.0 06/30/2019   HDL 37.20 (L) 06/30/2019   LDLDIRECT 169.0 06/24/2018   LDLCALC 100 (H) 06/30/2019   ALT 10 04/21/2021   AST 12 04/21/2021   NA 133 (L) 09/09/2021   K 4.3 09/09/2021   CL 102 09/09/2021   CREATININE 1.22 09/09/2021   BUN 15 09/09/2021   CO2 21 (L) 09/09/2021   TSH 2.12 03/13/2021   PSA 0.00 (L) 06/30/2019   HGBA1C 5.5 11/07/2021   Assessment/Plan:  Erik Lolling Sr. is a 76 y.o. White or Caucasian [1] male with  has a past medical history of Allergic rhinitis, Allergy, Anal fissure, Anemia, Anxiety, Blood transfusion without reported diagnosis, Cataract, Coronary atherosclerosis of native coronary artery (2007), Degenerative arthritis of knee, bilateral (10/09/2017), Depression, Diverticulosis (12/18/2011), Diverticulosis of colon with hemorrhage, Fibromyalgia, GERD with stricture, Helicobacter pylori (H. pylori) infection (11/25/2012), Hiatal hernia, HLD (hyperlipidemia), HTN (hypertension), Hyperlipidemia, IBS (irritable bowel syndrome), Impotence of organic origin, Insomnia, Internal and external hemorrhoids without complication, Interstitial cystitis, Irritable bowel syndrome (06/09/2010), Osteoporosis, Pelvic floor dysfunction (11/29/2017), Pneumonia, Reactive hypoglycemia, Rheumatoid arthritis(714.0), Somatization disorder  (02/27/2012), and Vitamin D deficiency (11/29/2017).  Abdominal pain, lower Difficult historian, for trial protonix, tramadol prn, f/u Dr Carlean Purl as planned, Consider CT abd/pelvis, declines further lab today  Insomnia Has tried multiple agents in past, ok for trial lunesta 2 mg qhs prn  Vitamin D deficiency Last vitamin D Lab Results  Component Value Date   VD25OH 36.16 11/10/2020   Low, to start oral replacement  Followup: Return if symptoms worsen or fail to improve.  Cathlean Cower, MD 12/12/2021 7:44 PM [facility]

## 2021-12-12 NOTE — Patient Instructions (Signed)
Please take all new medication as prescribed - the protonix, and lunesta  Please continue all other medications as before, and refills have been done if requested - the tramadol as needed  Please have the pharmacy call with any other refills you may need.  Please continue your efforts at being more active, low cholesterol diet, and weight control.  Please keep your appointments with your specialists as you may have planned

## 2021-12-13 DIAGNOSIS — Z886 Allergy status to analgesic agent status: Secondary | ICD-10-CM | POA: Diagnosis not present

## 2021-12-13 DIAGNOSIS — Z885 Allergy status to narcotic agent status: Secondary | ICD-10-CM | POA: Diagnosis not present

## 2021-12-13 DIAGNOSIS — Z887 Allergy status to serum and vaccine status: Secondary | ICD-10-CM | POA: Diagnosis not present

## 2021-12-13 DIAGNOSIS — Z433 Encounter for attention to colostomy: Secondary | ICD-10-CM | POA: Diagnosis not present

## 2021-12-13 DIAGNOSIS — Z888 Allergy status to other drugs, medicaments and biological substances status: Secondary | ICD-10-CM | POA: Diagnosis not present

## 2021-12-13 DIAGNOSIS — N99523 Herniation of incontinent stoma of urinary tract: Secondary | ICD-10-CM | POA: Diagnosis not present

## 2021-12-13 DIAGNOSIS — K469 Unspecified abdominal hernia without obstruction or gangrene: Secondary | ICD-10-CM | POA: Diagnosis not present

## 2021-12-14 ENCOUNTER — Telehealth: Payer: Self-pay | Admitting: Internal Medicine

## 2021-12-14 ENCOUNTER — Other Ambulatory Visit: Payer: Self-pay | Admitting: General Surgery

## 2021-12-14 DIAGNOSIS — M25511 Pain in right shoulder: Secondary | ICD-10-CM | POA: Diagnosis not present

## 2021-12-14 DIAGNOSIS — M6281 Muscle weakness (generalized): Secondary | ICD-10-CM | POA: Diagnosis not present

## 2021-12-14 DIAGNOSIS — K469 Unspecified abdominal hernia without obstruction or gangrene: Secondary | ICD-10-CM

## 2021-12-14 DIAGNOSIS — M25561 Pain in right knee: Secondary | ICD-10-CM | POA: Diagnosis not present

## 2021-12-14 DIAGNOSIS — M25512 Pain in left shoulder: Secondary | ICD-10-CM | POA: Diagnosis not present

## 2021-12-14 DIAGNOSIS — M25661 Stiffness of right knee, not elsewhere classified: Secondary | ICD-10-CM | POA: Diagnosis not present

## 2021-12-14 LAB — CALPROTECTIN, FECAL: Calprotectin, Fecal: <16 ug/g (ref 0–120)

## 2021-12-14 MED ORDER — DIPHENOXYLATE-ATROPINE 2.5-0.025 MG PO TABS: 1.0000 | ORAL_TABLET | Freq: Three times a day (TID) | ORAL | 0 refills | Status: DC

## 2021-12-14 NOTE — Telephone Encounter (Signed)
Left message for pt to call back  °

## 2021-12-14 NOTE — Telephone Encounter (Signed)
I have rxed lomotil 1 tid - it is antidiarrheal and anti-spasm medication

## 2021-12-14 NOTE — Telephone Encounter (Signed)
Pt called in earlier today. Phone Message was created: Pt came up to the Third Floor waiting room requesting to speak to someone. I personally spoke to this pt. Pt stated that he wants Dr. Carlean Purl to know that he has seen Dr. Zoe Lan Westcott a General Surgeon at [facility] and they ordered a CT scan abdomen and pelvis for a peristomal hernia.  Pt is questioning if should take pain medication or something to relax the pelvic floor. Pt states that he has burning in the lower abdomen/ pelvic floor followed by diarrhea; Please advise:

## 2021-12-14 NOTE — Telephone Encounter (Signed)
Pt made aware of Dr. Carlean Purl recommendations: pt verbalized understanding with all questions answered.

## 2021-12-14 NOTE — Telephone Encounter (Signed)
Patient called and stated that Dr Carlean Purl had mentioned to him about getting a CT done and is wanting to schedule. Seeking advice, please advise.

## 2021-12-18 ENCOUNTER — Ambulatory Visit: Payer: Medicare Other | Admitting: Internal Medicine

## 2021-12-18 ENCOUNTER — Ambulatory Visit
Admission: RE | Admit: 2021-12-18 | Discharge: 2021-12-18 | Disposition: A | Discharge status: Home / Self Care | Payer: Medicare Other | Source: Ambulatory Visit | Attending: General Surgery | Admitting: General Surgery

## 2021-12-18 ENCOUNTER — Other Ambulatory Visit: Payer: Self-pay

## 2021-12-18 DIAGNOSIS — K5730 Diverticulosis of large intestine without perforation or abscess without bleeding: Secondary | ICD-10-CM | POA: Diagnosis not present

## 2021-12-18 DIAGNOSIS — K469 Unspecified abdominal hernia without obstruction or gangrene: Secondary | ICD-10-CM

## 2021-12-18 DIAGNOSIS — N281 Cyst of kidney, acquired: Secondary | ICD-10-CM | POA: Diagnosis not present

## 2021-12-19 DIAGNOSIS — Z932 Ileostomy status: Secondary | ICD-10-CM | POA: Diagnosis not present

## 2021-12-20 DIAGNOSIS — N3010 Interstitial cystitis (chronic) without hematuria: Secondary | ICD-10-CM | POA: Diagnosis not present

## 2021-12-20 DIAGNOSIS — K435 Parastomal hernia without obstruction or  gangrene: Secondary | ICD-10-CM | POA: Diagnosis not present

## 2021-12-20 DIAGNOSIS — Z936 Other artificial openings of urinary tract status: Secondary | ICD-10-CM | POA: Diagnosis not present

## 2021-12-25 DIAGNOSIS — Z932 Ileostomy status: Secondary | ICD-10-CM | POA: Diagnosis not present

## 2021-12-27 DIAGNOSIS — Z932 Ileostomy status: Secondary | ICD-10-CM | POA: Diagnosis not present

## 2021-12-30 DIAGNOSIS — J019 Acute sinusitis, unspecified: Secondary | ICD-10-CM | POA: Diagnosis not present

## 2021-12-30 DIAGNOSIS — U071 COVID-19: Secondary | ICD-10-CM | POA: Diagnosis not present

## 2022-01-15 ENCOUNTER — Encounter: Payer: Self-pay | Admitting: Internal Medicine

## 2022-01-16 ENCOUNTER — Telehealth: Payer: Self-pay | Admitting: *Deleted

## 2022-01-16 NOTE — Telephone Encounter (Signed)
Patient is requesting results from his CT scan. Please advise ?

## 2022-01-17 NOTE — Telephone Encounter (Signed)
Spoke with patient and explained that Dr. Jenny Reichmann is not able to interpret the CT scan and that results would need to be discussed with the ordering provider.  ?

## 2022-01-17 NOTE — Telephone Encounter (Signed)
Called patient and left voice message in regards to his CT test results.  ?

## 2022-01-17 NOTE — Telephone Encounter (Signed)
Sorry, - I would direct the patient to contact the ordering provider for the interpretation of the CT scan, as the provider who orders is legally responsible for the interpretation of the radiologist interpretation and thereafter making recommendations for any further need for evaluation and treatment ? ?This is a legal issue that I should not get in the middle of ?

## 2022-01-18 ENCOUNTER — Ambulatory Visit: Payer: Medicare Other | Admitting: Internal Medicine

## 2022-01-22 ENCOUNTER — Ambulatory Visit (INDEPENDENT_AMBULATORY_CARE_PROVIDER_SITE_OTHER): Payer: Medicare Other | Admitting: Internal Medicine

## 2022-01-22 ENCOUNTER — Encounter: Payer: Self-pay | Admitting: Internal Medicine

## 2022-01-22 ENCOUNTER — Other Ambulatory Visit: Payer: Self-pay

## 2022-01-22 VITALS — BP 122/68 | HR 74 | Temp 98.0°F | Ht 67.0 in | Wt 164.0 lb

## 2022-01-22 DIAGNOSIS — Z0001 Encounter for general adult medical examination with abnormal findings: Secondary | ICD-10-CM

## 2022-01-22 DIAGNOSIS — E538 Deficiency of other specified B group vitamins: Secondary | ICD-10-CM

## 2022-01-22 DIAGNOSIS — E7849 Other hyperlipidemia: Secondary | ICD-10-CM | POA: Diagnosis not present

## 2022-01-22 DIAGNOSIS — E559 Vitamin D deficiency, unspecified: Secondary | ICD-10-CM

## 2022-01-22 DIAGNOSIS — R739 Hyperglycemia, unspecified: Secondary | ICD-10-CM

## 2022-01-22 DIAGNOSIS — R935 Abnormal findings on diagnostic imaging of other abdominal regions, including retroperitoneum: Secondary | ICD-10-CM

## 2022-01-22 DIAGNOSIS — R1011 Right upper quadrant pain: Secondary | ICD-10-CM

## 2022-01-22 LAB — BASIC METABOLIC PANEL
BUN: 18 mg/dL (ref 6–23)
CO2: 27 mEq/L (ref 19–32)
Calcium: 10.1 mg/dL (ref 8.4–10.5)
Chloride: 101 mEq/L (ref 96–112)
Creatinine, Ser: 1.27 mg/dL (ref 0.40–1.50)
GFR: 55.52 mL/min — ABNORMAL LOW (ref >60.00)
Glucose, Bld: 113 mg/dL — ABNORMAL HIGH (ref 70–99)
Potassium: 5.1 mEq/L (ref 3.5–5.1)
Sodium: 137 mEq/L (ref 135–145)

## 2022-01-22 LAB — LIPID PANEL
Cholesterol: 238 mg/dL — ABNORMAL HIGH (ref 0–200)
HDL: 47.1 mg/dL (ref >39.00)
LDL Cholesterol: 153 mg/dL — ABNORMAL HIGH (ref 0–99)
NonHDL: 190.42
Total CHOL/HDL Ratio: 5
Triglycerides: 185 mg/dL — ABNORMAL HIGH (ref 0.0–149.0)
VLDL: 37 mg/dL (ref 0.0–40.0)

## 2022-01-22 LAB — URINALYSIS, ROUTINE W REFLEX MICROSCOPIC
Bilirubin Urine: NEGATIVE
Ketones, ur: NEGATIVE
Nitrite: POSITIVE — AB
Specific Gravity, Urine: 1.01 (ref 1.000–1.030)
Total Protein, Urine: NEGATIVE
Urine Glucose: NEGATIVE
Urobilinogen, UA: 0.2 (ref 0.0–1.0)
pH: 6.5 (ref 5.0–8.0)

## 2022-01-22 LAB — CBC WITH DIFFERENTIAL/PLATELET
Basophils Absolute: 0.1 10*3/uL (ref 0.0–0.1)
Basophils Relative: 0.8 % (ref 0.0–3.0)
Eosinophils Absolute: 0.1 10*3/uL (ref 0.0–0.7)
Eosinophils Relative: 1.6 % (ref 0.0–5.0)
HCT: 44.9 % (ref 39.0–52.0)
Hemoglobin: 15 g/dL (ref 13.0–17.0)
Lymphocytes Relative: 37.4 % (ref 12.0–46.0)
Lymphs Abs: 3.3 10*3/uL (ref 0.7–4.0)
MCHC: 33.4 g/dL (ref 30.0–36.0)
MCV: 87.4 fl (ref 78.0–100.0)
Monocytes Absolute: 1 10*3/uL (ref 0.1–1.0)
Monocytes Relative: 11 % (ref 3.0–12.0)
Neutro Abs: 4.3 10*3/uL (ref 1.4–7.7)
Neutrophils Relative %: 49.2 % (ref 43.0–77.0)
Platelets: 305 10*3/uL (ref 150.0–400.0)
RBC: 5.14 Mil/uL (ref 4.22–5.81)
RDW: 14.2 % (ref 11.5–15.5)
WBC: 8.8 10*3/uL (ref 4.0–10.5)

## 2022-01-22 LAB — HEPATIC FUNCTION PANEL
ALT: 11 U/L (ref 0–53)
AST: 15 U/L (ref 0–37)
Albumin: 4.5 g/dL (ref 3.5–5.2)
Alkaline Phosphatase: 78 U/L (ref 39–117)
Bilirubin, Direct: 0.1 mg/dL (ref 0.0–0.3)
Total Bilirubin: 0.5 mg/dL (ref 0.2–1.2)
Total Protein: 7.6 g/dL (ref 6.0–8.3)

## 2022-01-22 LAB — VITAMIN B12: Vitamin B-12: 321 pg/mL (ref 211–911)

## 2022-01-22 LAB — LIPASE: Lipase: 30 U/L (ref 11.0–59.0)

## 2022-01-22 LAB — VITAMIN D 25 HYDROXY (VIT D DEFICIENCY, FRACTURES): VITD: 48.9 ng/mL (ref 30.00–100.00)

## 2022-01-22 LAB — HEMOGLOBIN A1C: Hgb A1c MFr Bld: 6.3 % (ref 4.6–6.5)

## 2022-01-22 LAB — TSH: TSH: 3.25 u[IU]/mL (ref 0.35–5.50)

## 2022-01-22 NOTE — Assessment & Plan Note (Signed)
Last vitamin D ?Lab Results  ?Component Value Date  ? VD25OH 36.16 11/10/2020  ? ?Low, to start oral replacement ? ?

## 2022-01-22 NOTE — Assessment & Plan Note (Signed)
Recent worsening, ? msk vs other given the abormal CT - pt is s/p EGD with normal appearing duodum in mar 2022 but given pain and CT will need urgent referral GI for possible repeat EGD ?

## 2022-01-22 NOTE — Assessment & Plan Note (Signed)
With ? 1.2 cm duodenal abnormal vs artifact - for f/u GI as planned ?

## 2022-01-22 NOTE — Assessment & Plan Note (Signed)
Lab Results  ?Component Value Date  ? LDLCALC 100 (H) 06/30/2019  ? ?Uncontrolled, goal ldl < 70, pt to continue low chol diet, declines statin for now ? ?

## 2022-01-22 NOTE — Progress Notes (Signed)
Patient ID: Erik Lolling Sr., male   DOB: [birth year], 76 y.o.   MRN: [id number]         Chief Complaint:: wellness exam and Office Visit (Burning sensation in right upper abdomen)        HPI:  Erik Lolling Sr. is a 76 y.o. male here for wellness exam; declines flu shot, shingrix, pneumovax o/w up to date                        Also c/w > 3 wks gradually worsening RUQ pain, mild now moderate, intermittent, worse he thought his right knee PT excercises so quit that x 2 wks but has not helped, tylenol not helping enough, Denies worsening reflux, other abd pain, dysphagia, n/v, bowel change or blood.  Remains with chronic right sided ostomy without bleeding pain.  Recent CT with ? 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  Pt denies chest pain, increased sob or doe, wheezing, orthopnea, PND, increased LE swelling, palpitations, dizziness or syncope.  Pt denies polydipsia, polyuria, or new focal neuro s/s.    Wt Readings from Last 3 Encounters:  01/22/22 164 lb (74.4 kg)  12/12/21 165 lb (74.8 kg)  12/07/21 164 lb (74.4 kg)   BP Readings from Last 3 Encounters:  01/22/22 122/68  12/12/21 126/74  12/07/21 130/80   Immunization History  Administered Date(s) Administered   Moderna Sars-Covid-2 Vaccination 01/11/2020, 02/08/2020   Pneumococcal Polysaccharide-23 07/03/2013   Pneumococcal-Unspecified 11/12/2012   Tdap 08/23/2014  There are no preventive care reminders to display for this patient.    Past Medical History:  Diagnosis Date   Allergic rhinitis    Allergy    Anal fissure    Anemia    Anxiety    Blood transfusion without reported diagnosis    Cataract    starting   Coronary atherosclerosis of native coronary artery 2007   nonobstructive CAD by cath with 40% mid-LAD stenosis   Degenerative arthritis of knee, bilateral 10/09/2017   Depression    Diverticulosis 12/18/2011   Diverticulosis of colon with hemorrhage    April 2021, [facility]    Fibromyalgia    GERD with stricture    Helicobacter pylori (H. 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   Vitamin D deficiency 11/29/2017   Past Surgical History:  Procedure Laterality Date   bladder distention     x 6   CATHETER REMOVAL     super pubic area   COLONOSCOPY     DENTAL SURGERY Right    #30 tooth extraction (right upper)   INTERSTIM IMPLANT PLACEMENT     INTERSTIM IMPLANT REMOVAL     KNEE ARTHROSCOPY     right x 2   NISSEN FUNDOPLICATION  0086   PROSTECTOMY     ROBOT ASSISTED LAPAROSCOPIC COMPLETE CYSTECT ILEAL CONDUIT  SHOULDER ARTHROSCOPY  2010   left   TONSILLECTOMY AND ADENOIDECTOMY  1957   TOTAL KNEE ARTHROPLASTY Right 09/08/2021   Procedure: TOTAL KNEE ARTHROPLASTY;  Surgeon: Susa Day, MD;  Location: [facility];  Service: Orthopedics;  Laterality: Right;   TRANSURETHRAL RESECTION OF PROSTATE     x 2   UPPER GASTROINTESTINAL ENDOSCOPY  05/13/2008   hiatal hernia   VASECTOMY      reports that he has quit smoking. He has never used smokeless tobacco. He reports that he does not drink alcohol and does not use drugs. family history includes Arthritis in his mother; Breast cancer in his sister; Colon polyps in his father; Diabetes in his father, paternal uncle, and sister; Heart disease in his father and mother; Hypertension in his father and mother; Liver cancer in his maternal grandfather and paternal uncle; Stroke in his mother. Allergies   Allergen Reactions   Ambien [Zolpidem]     Unknown reaction   Bentyl [Dicyclomine]     Memory loss, anxiety, blurred vision   Carafate [Sucralfate] Nausea And Vomiting   Lansoprazole Diarrhea   Lexapro [Escitalopram Oxalate]    Other     Pt states he is sensitive to all oral medications   Pneumovax [Pneumococcal Polysaccharide Vaccine] Other (See Comments)    pain   Prilosec [Omeprazole] Other (See Comments)    Per patient joint pain with PPI's   Remeron [Mirtazapine]    Seroquel [Quetiapine] Other (See Comments)    "terrible reaction"   Statins Other (See Comments)    Joint pain   Tizanidine Other (See Comments)    Made patient "feel crazy"   Tramadol Other (See Comments)    Can only take for 2 takes then he starts to have pain   Tylenol [Acetaminophen]     Can only take for 2 takes then he starts to have pain   Current Outpatient Medications on File Prior to Visit  Medication Sig Dispense Refill   cholecalciferol (VITAMIN D3) 25 MCG (1000 UNIT) tablet Take 1,000 Units by mouth daily.     diphenoxylate-atropine (LOMOTIL) 2.5-0.025 MG tablet Take 1 tablet by mouth in the morning, at noon, and at bedtime. 90 tablet 0   eszopiclone (LUNESTA) 2 MG TABS tablet Take 1 tablet (2 mg total) by mouth at bedtime as needed for sleep. Take immediately before bedtime 90 tablet 1   famotidine (PEPCID) 20 MG tablet Take 20 mg by mouth daily.     LORazepam (ATIVAN) 1 MG tablet Take 1 tablet (1 mg total) by mouth 2 (two) times daily as needed for anxiety. (Patient taking differently: Take 0.5 mg by mouth at bedtime.) 60 tablet 1   pantoprazole (PROTONIX) 40 MG tablet Take 1 tablet (40 mg total) by mouth daily. 90 tablet 3   Polyethyl Glycol-Propyl Glycol (SYSTANE OP) Place 1 drop into both eyes 2 (two) times daily as needed (dry/irritated eyes).     traMADol (ULTRAM) 50 MG tablet TAKE 1 TABLET BY MOUTH TWICE A DAY AS NEEDED 120 tablet 2   citalopram (CELEXA) 10 MG tablet Take 1 tablet (10 mg  total) by mouth daily. (Patient not taking: Reported on 12/08/2021) 90 tablet 3   No current facility-administered medications on file prior to visit.        ROS:  All others reviewed and negative.  Objective        PE:  BP 122/68 (BP Location: Right Arm, Patient Position: Sitting, Cuff Size: Large)    Pulse 74  Temp 98 F (36.7 C) (Oral)    Ht '5\' 7"'$  (1.702 m)    Wt 164 lb (74.4 kg)    SpO2 97%    BMI 25.69 kg/m                 Constitutional: Pt appears in NAD               HENT: Head: NCAT.                Right Ear: External ear normal.                 Left Ear: External ear normal.                Eyes: . Pupils are equal, round, and reactive to light. Conjunctivae and EOM are normal               Nose: without d/c or deformity               Neck: Neck supple. Gross normal ROM               Cardiovascular: Normal rate and regular rhythm.                 Pulmonary/Chest: Effort normal and breath sounds without rales or wheezing.                Abd:  Soft, NT, ND, + BS, no organomegaly               Neurological: Pt is alert. At baseline orientation, motor grossly intact               Skin: Skin is warm. No rashes, no other new lesions, LE edema - none               Psychiatric: Pt behavior is normal without agitation   Micro: none  Cardiac tracings I have personally interpreted today:  none  Pertinent Radiological findings (summarize): none   Lab Results  Component Value Date   WBC 16.7 (H) 09/10/2021   HGB 13.4 09/10/2021   HCT 39.8 09/10/2021   PLT 349 09/10/2021   GLUCOSE 158 (H) 09/09/2021   CHOL 163 06/30/2019   TRIG 132.0 06/30/2019   HDL 37.20 (L) 06/30/2019   LDLDIRECT 169.0 06/24/2018   LDLCALC 100 (H) 06/30/2019   ALT 10 04/21/2021   AST 12 04/21/2021   NA 133 (L) 09/09/2021   K 4.3 09/09/2021   CL 102 09/09/2021   CREATININE 1.22 09/09/2021   BUN 15 09/09/2021   CO2 21 (L) 09/09/2021   TSH 2.12 03/13/2021   PSA 0.00 (L) 06/30/2019   HGBA1C 5.5  11/07/2021   Assessment/Plan:  Erik Lolling Sr. is a 76 y.o. White or Caucasian [1] male with  has a past medical history of Allergic rhinitis, Allergy, Anal fissure, Anemia, Anxiety, Blood transfusion without reported diagnosis, Cataract, Coronary atherosclerosis of native coronary artery (2007), Degenerative arthritis of knee, bilateral (10/09/2017), Depression, Diverticulosis (12/18/2011), Diverticulosis of colon with hemorrhage, Fibromyalgia, GERD with stricture, Helicobacter pylori (H. 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  Encounter for well adult exam with abnormal findings Age and sex appropriate education and counseling updated with regular exercise  and diet Referrals for preventative services - none needed Immunizations addressed - declines flu shot, shingrix, pneumovax Smoking counseling  - none needed Evidence for depression or other mood disorder - chronic anxiety stable Most recent labs reviewed. I have personally reviewed and have noted: 1) the patient's medical and social history 2) The patient's current medications and supplements 3) The patient's height, weight, and BMI have been recorded in the chart   Vitamin D deficiency Last vitamin D Lab Results  Component Value Date   VD25OH 36.16 11/10/2020   Low, to start oral replacement   RUQ pain Recent worsening, ? msk vs other given the abormal CT - pt is s/p EGD with normal appearing duodum in mar 2022 but given pain and CT will need urgent referral GI for possible repeat  EGD  Hyperlipidemia Lab Results  Component Value Date   LDLCALC 100 (H) 06/30/2019   Uncontrolled, goal ldl < 70, pt to continue low chol diet, declines statin for now   Abnormal CT of the abdomen With ? 1.2 cm duodenal abnormal vs artifact - for f/u GI as planned  Followup: Return in about 6 months (around 07/25/2022).  Cathlean Cower, MD 01/22/2022 1:00 PM [facility]

## 2022-01-22 NOTE — Patient Instructions (Signed)
Please continue all other medications as before, and refills have been done if requested. ? ?Please have the pharmacy call with any other refills you may need. ? ?Please continue your efforts at being more active, low cholesterol diet, and weight control. ? ?You are otherwise up to date with prevention measures today. ? ?Please keep your appointments with your specialists as you may have planned ? ?You will be contacted regarding the referral for: GI ? ?Please go to the LAB at the blood drawing area for the tests to be done ? ?You will be contacted by phone if any changes need to be made immediately.  Otherwise, you will receive a letter about your results with an explanation, but please check with MyChart first. ? ?Please remember to sign up for MyChart if you have not done so, as this will be important to you in the future with finding out test results, communicating by private email, and scheduling acute appointments online when needed. ? ?Please make an Appointment to return in 6 months, or sooner if needed ?

## 2022-01-22 NOTE — Assessment & Plan Note (Signed)
Age and sex appropriate education and counseling updated with regular exercise and diet ?Referrals for preventative services - none needed ?Immunizations addressed - declines flu shot, shingrix, pneumovax ?Smoking counseling  - none needed ?Evidence for depression or other mood disorder - chronic anxiety stable ?Most recent labs reviewed. ?I have personally reviewed and have noted: ?1) the patient's medical and social history ?2) The patient's current medications and supplements ?3) The patient's height, weight, and BMI have been recorded in the chart ? ?

## 2022-01-23 DIAGNOSIS — Z932 Ileostomy status: Secondary | ICD-10-CM | POA: Diagnosis not present

## 2022-01-23 LAB — PSA: PSA: 0 ng/mL — ABNORMAL LOW (ref 0.10–4.00)

## 2022-01-24 DIAGNOSIS — K3189 Other diseases of stomach and duodenum: Secondary | ICD-10-CM | POA: Diagnosis not present

## 2022-02-01 ENCOUNTER — Ambulatory Visit: Payer: Medicare Other | Admitting: Physician Assistant

## 2022-02-01 ENCOUNTER — Encounter: Payer: Self-pay | Admitting: Physician Assistant

## 2022-02-01 VITALS — HR 66 | Ht 67.0 in | Wt 167.8 lb

## 2022-02-01 DIAGNOSIS — R1030 Lower abdominal pain, unspecified: Secondary | ICD-10-CM | POA: Diagnosis not present

## 2022-02-01 DIAGNOSIS — R935 Abnormal findings on diagnostic imaging of other abdominal regions, including retroperitoneum: Secondary | ICD-10-CM

## 2022-02-01 DIAGNOSIS — R1013 Epigastric pain: Secondary | ICD-10-CM | POA: Diagnosis not present

## 2022-02-01 NOTE — Progress Notes (Signed)
? ?Chief Complaint: Abnormal CT of the abdomen ? ?HPI: ?   [name]. is a 76 year old Caucasian male with a past medical history as listed below, known to Dr. Carlean Purl, who presents clinic today for complaint of abnormal CT of the abdomen.  ?   11/23/2015 colonoscopy with mild diverticulosis in the sigmoid colon otherwise normal.  Repeat not recommended ?   01/24/2021 EGD with LA grade a reflux esophagitis and benign-appearing esophageal stenosis which was dilated as well as Nissen fundoplication and gastritis.  Biopsy showed inflammation. ?   12/07/2021 patient seen in clinic for diarrhea and lower abdominal pain by Dr. Carlean Purl.  At that time discussed persistent high tone pelvic floor dysfunction plus or minus IBS. ?   12/14/2021 patient came to clinic and discussed with one of his nurses that Dr. Zoe Lan Wescott a general surgeon at [facility] would order CT scan of the abdomen pelvis for a peristomal hernia.  He continued diarrhea at that point and was given Lomotil 1 tab 3 times daily for his diarrhea and spasms. ?   12/18/2021 CT of the abdomen pelvis without contrast done by general surgery for placement of ileal conduit, evaluate for periosteal hernia.  Findings of a 12 mm focal wall thickening in the right lateral margin of the first portion of the duodenum.  Also wall thickening of the distal rectum which may be due to incomplete distention or suggest infiltrative process in the wall.  There was also a periosteal hernia containing short segment of small bowel loop without evidence of obstruction. ?   01/22/2022 normal CBC and hepatic function panel. ?   01/24/2022 patient followed with general surgery and at that time was recommended he have a duodenal endoscopy and possible biopsy with EUS. ?   Today, patient seen in clinic with Dr. Carlean Purl as well as patient's wife.  Patient describes that he was having a pinching/burning sensation in his epigastrium/right upper quadrant off and on which is why he  went to his doctor and they ended up evaluating him for possible stomal hernia with CT that found mass.  Patient tells me he has also been having an increase in reflux symptoms over the past 6 weeks tells me that he is "regurgitating in my sleep".  Currently taking Pantoprazole 40 mg before dinner.  Tells me he has been on twice daily dosing before but "my bones are bad and I would rather not go back on that". ?   Also describes some lower abdominal pain which is relieved after a bowel movement over the past 6 weeks or so. ?   Patient is very concerned about CT findings and tells me that he just "had a feeling" that something was going on and needed to get it checked out. ?   Also tells me he recently stopped his Ativan which she has been on for 10 to 12 years about 5 days ago.  Tells me he feels "more clear of mind" and has not really noticed a difference. ?   Denies fever, chills, weight loss or blood in his stool. ? ?Past Medical History:  ?Diagnosis Date  ? Allergic rhinitis   ? Allergy   ? Anal fissure   ? Anemia   ? Anxiety   ? Blood transfusion without reported diagnosis   ? Cataract   ? starting  ? Coronary atherosclerosis of native coronary artery 2007  ? nonobstructive CAD by cath with 40% mid-LAD stenosis  ? Degenerative arthritis of knee,  bilateral 10/09/2017  ? Depression   ? Diverticulosis 12/18/2011  ? Diverticulosis of colon with hemorrhage   ? April 2021, [facility]  ? Fibromyalgia   ? GERD with stricture   ? Helicobacter pylori (H. pylori) infection 11/25/2012  ? 11/2012 EGD + gastric bxs  ? Hiatal hernia   ? HLD (hyperlipidemia)   ? HTN (hypertension)   ? Hyperlipidemia   ? IBS (irritable bowel syndrome)   ? Impotence of organic origin   ? Insomnia   ? Internal and external hemorrhoids without complication   ? Interstitial cystitis   ? Irritable bowel syndrome 06/09/2010  ? Qualifier: Diagnosis of  By: Carlean Purl MD, Dimas Millin   ? Osteoporosis   ? Pelvic floor dysfunction 11/29/2017  ?  Pneumonia   ? Reactive hypoglycemia   ? Rheumatoid arthritis(714.0)   ? 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 Vitamin D deficiency 11/29/2017  ? ? ?Past Surgical History:  ?Procedure Laterality Date  ? bladder distention    ? x 6  ? CATHETER REMOVAL    ? super pubic area  ? COLONOSCOPY    ? DENTAL SURGERY Right   ? #30 tooth extraction (right upper)  ? INTERSTIM IMPLANT PLACEMENT    ? INTERSTIM IMPLANT REMOVAL    ? KNEE ARTHROSCOPY    ? right x 2  ? NISSEN FUNDOPLICATION  7681  ? PROSTECTOMY    ? ROBOT ASSISTED LAPAROSCOPIC COMPLETE CYSTECT ILEAL CONDUIT    ? SHOULDER ARTHROSCOPY  2010  ? left  ? TONSILLECTOMY AND ADENOIDECTOMY  1957  ? TOTAL KNEE ARTHROPLASTY Right 09/08/2021  ? Procedure: TOTAL KNEE ARTHROPLASTY;  Surgeon: Susa Day, MD;  Location: [facility];  Service: Orthopedics;  Laterality: Right;  ? TRANSURETHRAL RESECTION OF PROSTATE    ? x 2  ? UPPER GASTROINTESTINAL ENDOSCOPY  05/13/2008  ? hiatal hernia  ? VASECTOMY    ? ? ?Current Outpatient Medications  ?Medication Sig Dispense Refill  ? cholecalciferol (VITAMIN D3) 25 MCG (1000 UNIT) tablet Take 1,000 Units by mouth daily.    ? citalopram (CELEXA) 10 MG tablet Take 1 tablet (10 mg total) by mouth daily. (Patient not taking: Reported on 12/08/2021) 90 tablet 3  ? diphenoxylate-atropine (LOMOTIL) 2.5-0.025 MG tablet Take 1 tablet by mouth in the morning, at noon, and at bedtime. 90 tablet 0  ? eszopiclone (LUNESTA) 2 MG TABS tablet Take 1 tablet (2 mg total) by mouth at bedtime as needed for sleep. Take immediately before bedtime 90 tablet 1  ? famotidine (PEPCID) 20 MG tablet Take 20 mg by mouth daily.    ? LORazepam (ATIVAN) 1 MG tablet Take 1 tablet (1 mg total) by mouth 2 (two) times daily as needed for anxiety. (Patient taking differently: Take 0.5 mg by mouth at bedtime.) 60 tablet 1  ? pantoprazole (PROTONIX) 40 MG tablet Take 1 tablet (40 mg total) by mouth daily. 90 tablet 3  ? Polyethyl Glycol-Propyl Glycol (SYSTANE OP) Place 1 drop into both  eyes 2 (two) times daily as needed (dry/irritated eyes).    ? traMADol (ULTRAM) 50 MG tablet TAKE 1 TABLET BY MOUTH TWICE A DAY AS NEEDED 120 tablet 2  ? ?No current facility-administered medications for this visit.  ? ? ?Allergies as of 02/01/2022 - Review Complete 01/22/2022  ?Allergen Reaction Noted  ? Ambien [zolpidem]  07/26/2016  ? Bentyl [dicyclomine]  08/15/2020  ? Carafate [sucralfate] Nausea And Vomiting 03/16/2021  ? Lansoprazole Diarrhea 03/16/2021  ? Lexapro [escitalopram oxalate]  07/26/2016  ?  Other  08/22/2021  ? Pneumovax [pneumococcal polysaccharide vaccine] Other (See Comments) 02/02/2015  ? Prilosec [omeprazole] Other (See Comments) 12/12/2012  ? Remeron [mirtazapine]  07/26/2016  ? Seroquel [quetiapine] Other (See Comments) 03/18/2021  ? Statins Other (See Comments) 02/27/2012  ? Tizanidine Other (See Comments) 06/19/2021  ? Tramadol Other (See Comments) 11/08/2015  ? Tylenol [acetaminophen]  09/27/2020  ? ? ?Family History  ?Problem Relation Age of Onset  ? Colon polyps Father   ? Diabetes Father   ? Heart disease Father   ? Hypertension Father   ? Heart disease Mother   ? Hypertension Mother   ? Stroke Mother   ? Arthritis Mother   ? Diabetes Sister   ? Breast cancer Sister   ? Diabetes Paternal Uncle   ? Liver cancer Paternal Uncle   ? Liver cancer Maternal Grandfather   ? Colon cancer Neg Hx   ? Esophageal cancer Neg Hx   ? Rectal cancer Neg Hx   ? Stomach cancer Neg Hx   ? Pancreatic cancer Neg Hx   ? ? ?Social History  ? ?Socioeconomic History  ? Marital status: Married  ?  Spouse name: Not on file  ? Number of children: 4  ? Years of education: Not on file  ? Highest education level: Not on file  ?Occupational History  ? Occupation: retired  ?Tobacco Use  ? Smoking status: Former  ? Smokeless tobacco: Never  ? Tobacco comments:  ?  quit 1980  ?Vaping Use  ? Vaping Use: Never used  ?Substance and Sexual Activity  ? Alcohol use: No  ?  Alcohol/week: 0.0 standard drinks  ? Drug use: No  ?  Sexual activity: Yes  ?  Partners: Female  ?Other Topics Concern  ? Not on file  ?Social History Narrative  ? Married, retired for children  ? He is a former smoker no alcohol or substance use  ? ?Social Determinan

## 2022-02-01 NOTE — Patient Instructions (Addendum)
You will get a call soon about scheduling a EGD/EUS and Flexible Sigmoidoscopy  ? ?Start Pepcid 20 mg every night at bedtime ? ?Use Pantoprazole 40 mg daily ? ?If you are age 75 or older, your body mass index should be between 23-30. Your Body mass index is 26.28 kg/m?Marland Kitchen If this is out of the aforementioned range listed, please consider follow up with your Primary Care Provider. ? ?If you are age 33 or younger, your body mass index should be between 19-25. Your Body mass index is 26.28 kg/m?Marland Kitchen If this is out of the aformentioned range listed, please consider follow up with your Primary Care Provider.  ? ?________________________________________________________ ? ?The [facility] providers would like to encourage you to use [facility] to communicate with providers for non-urgent requests or questions.  Due to long hold times on the telephone, sending your provider a message by [facility] may be a faster and more efficient way to get a response.  Please allow 48 business hours for a response.  Please remember that this is for non-urgent requests.  ?_______________________________________________________  ? ?Thank you for choosing [facility] ? ?Lovett Calender   ?

## 2022-02-02 ENCOUNTER — Telehealth: Payer: Self-pay

## 2022-02-02 ENCOUNTER — Encounter: Payer: Self-pay | Admitting: Internal Medicine

## 2022-02-02 DIAGNOSIS — K219 Gastro-esophageal reflux disease without esophagitis: Secondary | ICD-10-CM

## 2022-02-02 DIAGNOSIS — R1013 Epigastric pain: Secondary | ICD-10-CM

## 2022-02-02 DIAGNOSIS — R935 Abnormal findings on diagnostic imaging of other abdominal regions, including retroperitoneum: Secondary | ICD-10-CM

## 2022-02-02 DIAGNOSIS — R1030 Lower abdominal pain, unspecified: Secondary | ICD-10-CM

## 2022-02-02 NOTE — Telephone Encounter (Signed)
Called and spoke with patient to discuss plan as outlined below. Pt states that his schedule is open and would be ok with any date. Pt knows that I will discuss dates with Dr. Carlean Purl and get back with him. Pt stated to leave appt information on his vm if he does not answer. ? ?Dr. Carlean Purl, your next available appt for a double in the [place] is not until 03/22/22. Is this timing OK or did you want pt scheduled sooner? Thanks ?

## 2022-02-02 NOTE — Telephone Encounter (Signed)
This is an EGD + flex sig - we can do both in 30 mins ?

## 2022-02-02 NOTE — Telephone Encounter (Signed)
Pt was given tramadol jan 31 for pain control from me ? ?I can refer to pain management if he likes but of course may take several months to accomplish ?

## 2022-02-02 NOTE — Telephone Encounter (Signed)
See below

## 2022-02-02 NOTE — Telephone Encounter (Signed)
-----   Message from Levin Erp, [place] sent at 02/02/2022 11:25 AM EDT ----- ?Regarding: RE: ? EUS ?Will do, thanks. ? ?Dariela Stoker can you set patient up for an EGD and flex sig with Dr. Carlean Purl at his next availability.  Please let him know that our specialists have decided that this abnormality is likely on the inside of his small intestine and should be able to be reached by Dr. Carlean Purl during a regular EGD. ? ?Thanks, JLL ?----- Message ----- ?From: Gatha Mayer, MD ?Sent: 02/02/2022  10:25 AM EDT ?To: Milus Banister, MD, Levin Erp, [place], # ?Subject: RE: ? EUS                                     ? ?OK ? ?Anderson Malta - set him up for EGD and flex sig -  ?----- Message ----- ?From: Milus Banister, MD ?Sent: 02/02/2022   6:24 AM EDT ?To: Gatha Mayer, MD, Levin Erp, [place], # ?Subject: RE: ? EUS                                     ? ?I agree, the area in the duodenum looks to be inside the duodenum.   EGD and flex sig first.  Let us know if there is anything he needs after that. ? ?Thanks ? ? ?----- Message ----- ?From: Mansouraty, Telford Nab., MD ?Sent: 02/01/2022   5:30 PM EDT ?To: Milus Banister, MD, Gatha Mayer, MD, # ?Subject: RE: ? EUS                                     ? ?CG, ?I think EUS is reasonable.  However as I look at the imaging it certainly looks to be polypoid in appearance rather than just wall like.  So I think if your plan to do a flex as well, then you will probably get that in sooner than we will be able to work for an EUS, and you will probably find the answer.  If you end up not being able to see much then certainly let DJ or I know and we can move forward from there. ?Thanks. ?GM ?----- Message ----- ?From: Gatha Mayer, MD ?Sent: 02/01/2022   3:25 PM EDT ?To: Milus Banister, MD, Levin Erp, [place], # ?Subject: ? EUS                                         ? ?Fletcher Anon, ? ?This patient saw Anderson Malta (just now, actually) and I was in clinic. 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 And had Ct by Judeth Cornfield. ? ?Review of recent CT scan would be appreciated - ? Duodenal mass ? ?Seems like EUS would be definitive but I can do EGD if you think best to start w/ that ? ?I was thinking flex sig for rectal changes as well ? ?Thanks for your time and thoughts, ? ?Glendell Docker ? ? ? ? ? ?

## 2022-02-05 ENCOUNTER — Encounter: Payer: Self-pay | Admitting: Internal Medicine

## 2022-02-05 NOTE — Addendum Note (Signed)
Addended by: Yevette Edwards on: 02/05/2022 09:55 AM ? ? Modules accepted: Orders ? ?

## 2022-02-05 NOTE — Telephone Encounter (Signed)
Called and spoke with patient regarding his appt. Pt has been scheduled for EGD + flex sig in the [facility] on Firday, 02/09/22 at 9:30 am. Pt is aware that he will need to arrive at 8:30 am with a care partner. Pt reports that he has access to his my chart. Pt knows that I will send his instructions via my chart. Pt verbalized understanding and had no concerns at the end of the call. ? ?Ambulatory referral to GI in epic. Instructions sent to pt via my chart. ?

## 2022-02-06 ENCOUNTER — Encounter: Payer: Self-pay | Admitting: Internal Medicine

## 2022-02-08 ENCOUNTER — Encounter: Payer: Self-pay | Admitting: Internal Medicine

## 2022-02-08 ENCOUNTER — Ambulatory Visit (INDEPENDENT_AMBULATORY_CARE_PROVIDER_SITE_OTHER): Payer: Medicare Other | Admitting: Internal Medicine

## 2022-02-08 VITALS — BP 120/72 | HR 82 | Temp 97.8°F | Ht 67.0 in | Wt 165.8 lb

## 2022-02-08 DIAGNOSIS — F419 Anxiety disorder, unspecified: Secondary | ICD-10-CM | POA: Diagnosis not present

## 2022-02-08 DIAGNOSIS — F5101 Primary insomnia: Secondary | ICD-10-CM

## 2022-02-08 DIAGNOSIS — E559 Vitamin D deficiency, unspecified: Secondary | ICD-10-CM

## 2022-02-08 DIAGNOSIS — E7800 Pure hypercholesterolemia, unspecified: Secondary | ICD-10-CM | POA: Diagnosis not present

## 2022-02-08 MED ORDER — LORAZEPAM 0.5 MG PO TABS: 0.2500 mg | ORAL_TABLET | Freq: Two times a day (BID) | ORAL | 2 refills | Status: DC | PRN

## 2022-02-08 NOTE — Assessment & Plan Note (Signed)
Lab Results  ?Component Value Date  ? LDLCALC 153 (H) 01/22/2022  ? ?Uncontrolled, goal ldl < 70, pt to continue low chol diet, declines statin zetia or repatha ? ?

## 2022-02-08 NOTE — Assessment & Plan Note (Signed)
Last vitamin D Lab Results  Component Value Date   VD25OH 48.90 01/22/2022   Stable, cont oral replacement  

## 2022-02-08 NOTE — Assessment & Plan Note (Signed)
Chronic persistent, for add flexeril 10 qhs combines with gabapentin 300 - 600 qhs,  to f/u any worsening symptoms or concerns ?

## 2022-02-08 NOTE — Progress Notes (Signed)
Patient ID: Erik Lolling Sr., male   DOB: 06/07/1946, 76 y.o.   MRN: [id number] ? ? ? ?    Chief Complaint: follow up hld, anxiety, insomnia, low vit d ? ?     HPI:  Erik HIGHAM Sr. is a 76 y.o. male here with c/o worsening sleep difficulty with awakening multiple times per night; Pt denies chest pain, increased sob or doe, wheezing, orthopnea, PND, increased LE swelling, palpitations, dizziness or syncope.   Pt denies polydipsia, polyuria, or new focal neuro s/s.    Pt denies fever, wt loss, night sweats, loss of appetite, or other constitutional symptoms  Trying to follow lower chol diet, declines statin.  Does c/o ongoing fatigue, but denies signficant daytime hypersomnolence.  Trying to wean off ativan, now down to  bid ativan for the last 2 wks, but pills cant be cut further to .25.  Denies worsening depressive symptoms, suicidal ideation, or panic; has ongoing anxiety ?      ?Wt Readings from Last 3 Encounters:  ?02/08/22 165 lb 12.8 oz (75.2 kg)  ?02/01/22 167 lb 12.8 oz (76.1 kg)  ?01/22/22 164 lb (74.4 kg)  ? ?BP Readings from Last 3 Encounters:  ?02/08/22 120/72  ?01/22/22 122/68  ?12/12/21 126/74  ? ?      ?Past Medical History:  ?Diagnosis Date  ? Allergic rhinitis   ? Allergy   ? Anal fissure   ? Anemia   ? Anxiety   ? Blood transfusion without reported diagnosis   ? Cataract   ? starting  ? Coronary atherosclerosis of native coronary artery 2007  ? nonobstructive CAD by cath with 40% mid-LAD stenosis  ? Degenerative arthritis of knee, bilateral 10/09/2017  ? Depression   ? Diverticulosis 12/18/2011  ? Diverticulosis of colon with hemorrhage   ? April 2021, [facility]  ? Duodenal mass   ? Fibromyalgia   ? GERD with stricture   ? Helicobacter pylori (H. pylori) infection 11/25/2012  ? 11/2012 EGD + gastric bxs  ? Hiatal hernia   ? HLD (hyperlipidemia)   ? HTN (hypertension)   ? Hyperlipidemia   ? IBS (irritable bowel syndrome)   ? Impotence of organic origin   ? Insomnia   ? Internal and external  hemorrhoids without complication   ? Interstitial cystitis   ? Irritable bowel syndrome 06/09/2010  ? Qualifier: Diagnosis of  By: Carlean Purl MD, Dimas Millin   ? Osteoporosis   ? Pelvic floor dysfunction 11/29/2017  ? Pneumonia   ? Reactive hypoglycemia   ? Rheumatoid arthritis(714.0)   ? 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 Vitamin D deficiency 11/29/2017  ? ?Past Surgical History:  ?Procedure Laterality Date  ? bladder distention    ? x 6  ? CATHETER REMOVAL    ? super pubic area  ? COLONOSCOPY    ? DENTAL SURGERY Right   ? #30 tooth extraction (right upper)  ? INTERSTIM IMPLANT PLACEMENT    ? INTERSTIM IMPLANT REMOVAL    ? KNEE ARTHROSCOPY    ? right x 2  ? NISSEN FUNDOPLICATION  5726  ? PROSTECTOMY    ? ROBOT ASSISTED LAPAROSCOPIC COMPLETE CYSTECT ILEAL CONDUIT    ? SHOULDER ARTHROSCOPY  2010  ? left  ? TONSILLECTOMY AND ADENOIDECTOMY  1957  ? TOTAL KNEE ARTHROPLASTY Right 09/08/2021  ? Procedure: TOTAL KNEE ARTHROPLASTY;  Surgeon: Susa Day, MD;  Location: [facility];  Service: Orthopedics;  Laterality: Right;  ? TRANSURETHRAL RESECTION OF PROSTATE    ?  x 2  ? UPPER GASTROINTESTINAL ENDOSCOPY  05/13/2008  ? hiatal hernia  ? VASECTOMY    ? ? reports that he has quit smoking. He has never used smokeless tobacco. He reports that he does not drink alcohol and does not use drugs. ?family history includes Arthritis in his mother; Breast cancer in his sister; Colon polyps in his father; Diabetes in his father, paternal uncle, and sister; Heart disease in his father and mother; Hypertension in his father and mother; Liver cancer in his maternal grandfather and paternal uncle; Stroke in his mother. ?Allergies  ?Allergen Reactions  ? Ambien [Zolpidem]   ?  Unknown reaction  ? Bentyl [Dicyclomine]   ?  Memory loss, anxiety, blurred vision  ? Carafate [Sucralfate] Nausea And Vomiting  ? Lansoprazole Diarrhea  ? Lexapro [Escitalopram Oxalate]   ? Other   ?  Pt states he is sensitive to all oral medications  ? Pneumovax  [Pneumococcal Polysaccharide Vaccine] Other (See Comments)  ?  pain  ? Prilosec [Omeprazole] Other (See Comments)  ?  Per patient joint pain with PPI's  ? Remeron [Mirtazapine]   ? Seroquel [Quetiapine] Other (See Comments)  ?  "terrible reaction"  ? Statins Other (See Comments)  ?  Joint pain  ? Tizanidine Other (See Comments)  ?  Made patient "feel crazy"  ? Tramadol Other (See Comments)  ?  Can only take for 2 takes then he starts to have pain  ? Tylenol [Acetaminophen]   ?  Can only take for 2 takes then he starts to have pain  ? ?Current Outpatient Medications on File Prior to Visit  ?Medication Sig Dispense Refill  ? cholecalciferol (VITAMIN D3) 25 MCG (1000 UNIT) tablet Take 1,000 Units by mouth daily.    ? citalopram (CELEXA) 10 MG tablet Take 1 tablet (10 mg total) by mouth daily. (Patient not taking: Reported on 12/08/2021) 90 tablet 3  ? famotidine (PEPCID) 20 MG tablet Take 20 mg by mouth daily as needed.    ? Melatonin 3 MG CAPS Take 3 mg by mouth at bedtime.    ? pantoprazole (PROTONIX) 40 MG tablet Take 1 tablet (40 mg total) by mouth daily. 90 tablet 3  ? Polyethyl Glycol-Propyl Glycol (SYSTANE OP) Place 1 drop into both eyes 2 (two) times daily as needed (dry/irritated eyes).    ? traMADol (ULTRAM) 50 MG tablet TAKE 1 TABLET BY MOUTH TWICE A DAY AS NEEDED 120 tablet 2  ? ?No current facility-administered medications on file prior to visit.  ? ?     ROS:  All others reviewed and negative. ? ?Objective  ? ?     PE:  BP 120/72 (BP Location: Left Arm, Patient Position: Sitting, Cuff Size: Large)   Pulse 82   Temp 97.8 ?F (36.6 ?C) (Oral)   Ht '5\' 7"'$  (1.702 m)   Wt 165 lb 12.8 oz (75.2 kg)   SpO2 97%   BMI 25.97 kg/m?  ? ?              Constitutional: Pt appears in NAD ?              HENT: Head: NCAT.  ?              Right Ear: External ear normal.   ?              Left Ear: External ear normal.  ?  Eyes: . Pupils are equal, round, and reactive to light. Conjunctivae and EOM are normal ?               Nose: without d/c or deformity ?              Neck: Neck supple. Gross normal ROM ?              Cardiovascular: Normal rate and regular rhythm.   ?              Pulmonary/Chest: Effort normal and breath sounds without rales or wheezing.  ?              Abd:  Soft, NT, ND, + BS, no organomegaly ?              Neurological: Pt is alert. At baseline orientation, motor grossly intact ?              Skin: Skin is warm. No rashes, no other new lesions, LE edema - none ?              Psychiatric: Pt behavior is normal without agitation  ? ?Micro: none ? ?Cardiac tracings I have personally interpreted today:  none ? ?Pertinent Radiological findings (summarize): none  ? ?Lab Results  ?Component Value Date  ? WBC 8.8 01/22/2022  ? HGB 15.0 01/22/2022  ? HCT 44.9 01/22/2022  ? PLT 305.0 01/22/2022  ? GLUCOSE 113 (H) 01/22/2022  ? CHOL 238 (H) 01/22/2022  ? TRIG 185.0 (H) 01/22/2022  ? HDL 47.10 01/22/2022  ? LDLDIRECT 169.0 06/24/2018  ? LDLCALC 153 (H) 01/22/2022  ? ALT 11 01/22/2022  ? AST 15 01/22/2022  ? NA 137 01/22/2022  ? K 5.1 01/22/2022  ? CL 101 01/22/2022  ? CREATININE 1.27 01/22/2022  ? BUN 18 01/22/2022  ? CO2 27 01/22/2022  ? TSH 3.25 01/22/2022  ? PSA 0.00 (L) 01/22/2022  ? HGBA1C 6.3 01/22/2022  ? ?Assessment/Plan:  ?Erik Lolling Sr. is a 76 y.o. White or Caucasian [1] male with  has a past medical history of Allergic rhinitis, Allergy, Anal fissure, Anemia, Anxiety, Blood transfusion without reported diagnosis, Cataract, Coronary atherosclerosis of native coronary artery (2007), Degenerative arthritis of knee, bilateral (10/09/2017), Depression, Diverticulosis (12/18/2011), Diverticulosis of colon with hemorrhage, Duodenal mass, Fibromyalgia, GERD with stricture, Helicobacter pylori (H. pylori) infection (11/25/2012), Hiatal hernia, HLD (hyperlipidemia), HTN (hypertension), Hyperlipidemia, IBS (irritable bowel syndrome), Impotence of organic origin, Insomnia, Internal and external hemorrhoids  without complication, Interstitial cystitis, Irritable bowel syndrome (06/09/2010), Osteoporosis, Pelvic floor dysfunction (11/29/2017), Pneumonia, Reactive hypoglycemia, Rheumatoid arthritis(714.0), Somatization di

## 2022-02-08 NOTE — Assessment & Plan Note (Signed)
Chronic stable, ok to continue to wean off ativan at .25 bid prn and off after 2-3 wks ?

## 2022-02-08 NOTE — Patient Instructions (Addendum)
Ok to try to wean off the lorazepam with 0.5 mg - 1/2 tab twice per day as needed ? ?Ok to take flexeril 10 mg AND the gabapentin up to 600 mg at bedtime for sleep if needed ? ?Please continue all other medications as before, and refills have been done if requested. ? ?Please have the pharmacy call with any other refills you may need. ? ?Please continue your efforts at being more active, low cholesterol diet, and weight control. ? ?Please keep your appointments with your specialists as you may have planned ? ? ? ? ? ?

## 2022-02-09 ENCOUNTER — Encounter: Payer: Self-pay | Admitting: Internal Medicine

## 2022-02-09 ENCOUNTER — Ambulatory Visit (AMBULATORY_SURGERY_CENTER): Payer: Medicare Other | Admitting: Internal Medicine

## 2022-02-09 VITALS — BP 115/58 | HR 64 | Temp 98.9°F | Resp 11 | Ht 67.0 in | Wt 167.0 lb

## 2022-02-09 DIAGNOSIS — R935 Abnormal findings on diagnostic imaging of other abdominal regions, including retroperitoneum: Secondary | ICD-10-CM

## 2022-02-09 DIAGNOSIS — K2970 Gastritis, unspecified, without bleeding: Secondary | ICD-10-CM | POA: Diagnosis not present

## 2022-02-09 DIAGNOSIS — R933 Abnormal findings on diagnostic imaging of other parts of digestive tract: Secondary | ICD-10-CM | POA: Diagnosis not present

## 2022-02-09 MED ORDER — SODIUM CHLORIDE 0.9 % IV SOLN: 500.0000 mL | Freq: Once | INTRAVENOUS | Status: DC

## 2022-02-09 NOTE — Progress Notes (Signed)
To Pacu, VSS. Report to [email or web address] 

## 2022-02-09 NOTE — Op Note (Signed)
[name] ?Patient Name: [name] ?Procedure Date: 02/09/2022 9:47 AM ?MRN: [id number] ?Endoscopist: Gatha Mayer , MD ?Age: 76 ?Referring MD:  ?Date of Birth: 1946/10/16 ?Gender: Male ?Account #: [id number] ?Procedure:                Upper GI endoscopy ?Indications:              Abnormal CT of the GI tract abnormal wall of D1 ?Medicines:                Monitored Anesthesia Care ?Procedure:                Pre-Anesthesia Assessment: ?                          - Prior to the procedure, a History and Physical  ?                          was performed, and patient medications and  ?                          allergies were reviewed. The patient's tolerance of  ?                          previous anesthesia was also reviewed. The risks  ?                          and benefits of the procedure and the sedation  ?                          options and risks were discussed with the patient.  ?                          All questions were answered, and informed consent  ?                          was obtained. Prior Anticoagulants: The patient has  ?                          taken no previous anticoagulant or antiplatelet  ?                          agents. ASA Grade Assessment: II - A patient with  ?                          mild systemic disease. After reviewing the risks  ?                          and benefits, the patient was deemed in  ?                          satisfactory condition to undergo the procedure. ?                          After obtaining informed consent, the endoscope was  ?  passed under direct vision. Throughout the  ?                          procedure, the patient's blood pressure, pulse, and  ?                          oxygen saturations were monitored continuously. The  ?                          GIF [id number] was introduced through the  ?                          mouth, and advanced to the second part of duodenum.  ?                          The upper GI  endoscopy was accomplished without  ?                          difficulty. The patient tolerated the procedure  ?                          well. ?Scope In: ?Scope Out: ?Findings:                 The examined esophagus was normal. ?                          Diffuse mild inflammation was found in the gastric  ?                          antrum. ?                          The examined duodenum was normal. ?                          The cardia and gastric fundus were otherwise normal  ?                          on retroflexion. ?Complications:            No immediate complications. ?Estimated Blood Loss:     Estimated blood loss: none. ?Impression:               - Normal esophagus. ?                          - Chronic gastritis. Known from prior biopsies. ?                          - Normal examined duodenum. No abnormality to  ?                          correlate with thickened wall of D1 seen on CT -  ?                          suspect that was contraction of  the duodenum. ?                          - No specimens collected. ?Recommendation:           - Patient has a contact number available for  ?                          emergencies. The signs and symptoms of potential  ?                          delayed complications were discussed with the  ?                          patient. Return to normal activities tomorrow.  ?                          Written discharge instructions were provided to the  ?                          patient. ?                          - See the other procedure note for documentation of  ?                          additional recommendations. Flex sig next -  ?                          thickened rectum on CT ?Gatha Mayer, MD ?02/09/2022 10:12:21 AM ?This report has been signed electronically. ?

## 2022-02-09 NOTE — Op Note (Signed)
[facility] ?Patient Name: [name] ?Procedure Date: 02/09/2022 9:47 AM ?MRN: [id number] ?Endoscopist: Gatha Mayer , MD ?Age: 76 ?Referring MD:  ?Date of Birth: 07/13/1946 ?Gender: Male ?Account #: [id number] ?Procedure:                Flexible Sigmoidoscopy ?Indications:              Abnormal CT of the GI tract thickened rectal wall ?Medicines:                Monitored Anesthesia Care ?Procedure:                Pre-Anesthesia Assessment: ?                          - Prior to the procedure, a History and Physical  ?                          was performed, and patient medications and  ?                          allergies were reviewed. The patient's tolerance of  ?                          previous anesthesia was also reviewed. The risks  ?                          and benefits of the procedure and the sedation  ?                          options and risks were discussed with the patient.  ?                          All questions were answered, and informed consent  ?                          was obtained. Prior Anticoagulants: The patient has  ?                          taken no previous anticoagulant or antiplatelet  ?                          agents. ASA Grade Assessment: II - A patient with  ?                          mild systemic disease. After reviewing the risks  ?                          and benefits, the patient was deemed in  ?                          satisfactory condition to undergo the procedure. ?                          After obtaining informed consent, the scope was  ?  passed under direct vision. The GIF [id number]  ?                          was introduced through the anus and advanced to the  ?                          the sigmoid colon. The flexible sigmoidoscopy was  ?                          accomplished without difficulty. The patient  ?                          tolerated the procedure well. The quality of the  ?                          bowel  preparation was adequate. ?Scope In: ?Scope Out: ?Findings:                 The perianal and digital rectal examinations were  ?                          normal. ?                          The rectum, recto-sigmoid colon, sigmoid colon and  ?                          distal sigmoid colon appeared normal. ?Complications:            No immediate complications. ?Estimated Blood Loss:     Estimated blood loss: none. ?Impression:               - The rectum, recto-sigmoid colon, sigmoid colon  ?                          and distal sigmoid colon are normal. Ct findings  ?                          must have been due to contraction. ?                          - No specimens collected. ?Recommendation:           - Patient has a contact number available for  ?                          emergencies. The signs and symptoms of potential  ?                          delayed complications were discussed with the  ?                          patient. Return to normal activities tomorrow.  ?                          Written discharge instructions were provided to the  ?  patient. ?                          - Resume previous diet. ?Gatha Mayer, MD ?02/09/2022 10:14:24 AM ?This report has been signed electronically. ?

## 2022-02-09 NOTE — Patient Instructions (Addendum)
The CT scan abnormalities were not seen. This happens and is typically related to contraction of the intestines during the exam. All ok. ? ?I appreciate the opportunity to care for you. ?Gatha Mayer, MD, [facility] ? ? ?YOU HAD AN ENDOSCOPIC PROCEDURE TODAY AT [facility]:   Refer to the procedure report that was given to you for any specific questions about what was found during the examination.  If the procedure report does not answer your questions, please call your gastroenterologist to clarify.  If you requested that your care partner not be given the details of your procedure findings, then the procedure report has been included in a sealed envelope for you to review at your convenience later. ? ?YOU SHOULD EXPECT: Some feelings of bloating in the abdomen. Passage of more gas than usual.  Walking can help get rid of the air that was put into your GI tract during the procedure and reduce the bloating. If you had a lower endoscopy (such as a colonoscopy or flexible sigmoidoscopy) you may notice spotting of blood in your stool or on the toilet paper. If you underwent a bowel prep for your procedure, you may not have a normal bowel movement for a few days. ? ?Please Note:  You might notice some irritation and congestion in your nose or some drainage.  This is from the oxygen used during your procedure.  There is no need for concern and it should clear up in a day or so. ? ?SYMPTOMS TO REPORT IMMEDIATELY: ? ?Following lower endoscopy (colonoscopy or flexible sigmoidoscopy): ? Excessive amounts of blood in the stool ? Significant tenderness or worsening of abdominal pains ? Swelling of the abdomen that is new, acute ? Fever of 100?F or higher ? ?Following upper endoscopy (EGD) ? Vomiting of blood or coffee ground material ? New chest pain or pain under the shoulder blades ? Painful or persistently difficult swallowing ? New shortness of breath ? Fever of 100?F or higher ? Black, tarry-looking  stools ? ?For urgent or emergent issues, a gastroenterologist can be reached at any hour by calling [phone]. ?Do not use MyChart messaging for urgent concerns.  ? ? ?DIET:  We do recommend a small meal at first, but then you may proceed to your regular diet.  Drink plenty of fluids but you should avoid alcoholic beverages for 24 hours. ? ?ACTIVITY:  You should plan to take it easy for the rest of today and you should NOT DRIVE or use heavy machinery until tomorrow (because of the sedation medicines used during the test).   ? ?FOLLOW UP: ?Our staff will call the number listed on your records 48-72 hours following your procedure to check on you and address any questions or concerns that you may have regarding the information given to you following your procedure. If we do not reach you, we will leave a message.  We will attempt to reach you two times.  During this call, we will ask if you have developed any symptoms of COVID 19. If you develop any symptoms (ie: fever, flu-like symptoms, shortness of breath, cough etc.) before then, please call [phone].  If you test positive for Covid 19 in the 2 weeks post procedure, please call and report this information to [name].   ? ?If any biopsies were taken you will be contacted by phone or by letter within the next 1-3 weeks.  Please call us at [phone] if you have not heard about the biopsies  in 3 weeks.  ? ? ?SIGNATURES/CONFIDENTIALITY: ?You and/or your care partner have signed paperwork which will be entered into your electronic medical record.  These signatures attest to the fact that that the information above on your After Visit Summary has been reviewed and is understood.  Full responsibility of the confidentiality of this discharge information lies with you and/or your care-partner.  ?

## 2022-02-09 NOTE — Progress Notes (Signed)
Pt's states no medical or surgical changes since previsit or office visit. 

## 2022-02-09 NOTE — Progress Notes (Signed)
History and Physical Interval Note: ? ?02/09/2022 ?9:44 AM ? ?Erik Lolling Sr.  has presented today for endoscopic procedure(s), with the diagnosis of  ?Encounter Diagnosis  ?Name Primary?  ? Abnormal CT of the abdomen Yes  ?Marland Kitchen  The various methods of evaluation and treatment have been discussed with the patient and/or family. After consideration of risks, benefits and other options for treatment, the patient has consented to  the endoscopic procedure(s). ? ? The patient's history has been reviewed, patient examined, no change in status, stable for endoscopic procedure(s).  I have reviewed the patient's chart and labs.  Questions were answered to the patient's satisfaction.   ? ? ?Gatha Mayer, MD, [facility] ? ?

## 2022-02-11 ENCOUNTER — Encounter: Payer: Self-pay | Admitting: Internal Medicine

## 2022-02-12 DIAGNOSIS — R935 Abnormal findings on diagnostic imaging of other abdominal regions, including retroperitoneum: Secondary | ICD-10-CM | POA: Diagnosis not present

## 2022-02-13 ENCOUNTER — Telehealth: Payer: Self-pay

## 2022-02-13 MED ORDER — GABAPENTIN 100 MG PO CAPS: 100.0000 mg | ORAL_CAPSULE | Freq: Three times a day (TID) | ORAL | 3 refills | Status: DC

## 2022-02-13 NOTE — Telephone Encounter (Signed)
?  Follow up Call- ? ? ?  02/09/2022  ?  9:18 AM 01/24/2021  ?  9:12 AM  ?Call back number  ?Post procedure Call Back phone  # [phone]  ?Permission to leave phone message Yes Yes  ?  ? ?Patient questions: ? ?Do you have a fever, pain , or abdominal swelling? No. ?Pain Score  0 * ? ?Have you tolerated food without any problems? Yes.   ? ?Have you been able to return to your normal activities? Yes.   ? ?Do you have any questions about your discharge instructions: ?Diet   No. ?Medications  No. ?Follow up visit  No. ? ?Do you have questions or concerns about your Care? No. ? ?Actions: ?* If pain score is 4 or above: ?No action needed, pain <4. ? ? ?

## 2022-02-19 DIAGNOSIS — Z932 Ileostomy status: Secondary | ICD-10-CM | POA: Diagnosis not present

## 2022-02-22 ENCOUNTER — Telehealth: Payer: Self-pay | Admitting: Internal Medicine

## 2022-02-22 ENCOUNTER — Encounter: Payer: Self-pay | Admitting: Internal Medicine

## 2022-02-22 NOTE — Telephone Encounter (Signed)
Spoke to Dr. Jerene Pitch.  He and I agree no further assessment of CT abnormality (possible) in duodenum needed after negative EGD.  He will relay to the patient and so I. ?

## 2022-02-22 NOTE — Telephone Encounter (Signed)
Good Morning Dr. Carlean Purl,  ? ? ?Dr. Jerene Pitch from [facility] called wanting to speak with you in regards to patient. Stated just to give him a call whenever you get the chance. ? ?[phone] ?

## 2022-03-09 DIAGNOSIS — M722 Plantar fascial fibromatosis: Secondary | ICD-10-CM | POA: Diagnosis not present

## 2022-03-13 DIAGNOSIS — M25511 Pain in right shoulder: Secondary | ICD-10-CM | POA: Diagnosis not present

## 2022-03-13 DIAGNOSIS — M542 Cervicalgia: Secondary | ICD-10-CM | POA: Diagnosis not present

## 2022-03-14 DIAGNOSIS — Z936 Other artificial openings of urinary tract status: Secondary | ICD-10-CM | POA: Diagnosis not present

## 2022-03-14 DIAGNOSIS — H04129 Dry eye syndrome of unspecified lacrimal gland: Secondary | ICD-10-CM | POA: Diagnosis not present

## 2022-03-14 DIAGNOSIS — R631 Polydipsia: Secondary | ICD-10-CM | POA: Insufficient documentation

## 2022-03-14 DIAGNOSIS — E782 Mixed hyperlipidemia: Secondary | ICD-10-CM | POA: Diagnosis not present

## 2022-03-14 DIAGNOSIS — Z906 Acquired absence of other parts of urinary tract: Secondary | ICD-10-CM | POA: Diagnosis not present

## 2022-03-14 DIAGNOSIS — F5101 Primary insomnia: Secondary | ICD-10-CM | POA: Diagnosis not present

## 2022-03-19 ENCOUNTER — Encounter: Payer: Self-pay | Admitting: Internal Medicine

## 2022-03-21 ENCOUNTER — Encounter: Payer: Self-pay | Admitting: Internal Medicine

## 2022-03-21 MED ORDER — AMITRIPTYLINE HCL 50 MG PO TABS: 50.0000 mg | ORAL_TABLET | Freq: Every day | ORAL | 1 refills | Status: DC

## 2022-03-22 DIAGNOSIS — H2513 Age-related nuclear cataract, bilateral: Secondary | ICD-10-CM | POA: Diagnosis not present

## 2022-03-22 DIAGNOSIS — Z932 Ileostomy status: Secondary | ICD-10-CM | POA: Diagnosis not present

## 2022-03-22 DIAGNOSIS — H40003 Preglaucoma, unspecified, bilateral: Secondary | ICD-10-CM | POA: Diagnosis not present

## 2022-03-27 ENCOUNTER — Encounter: Payer: Self-pay | Admitting: Internal Medicine

## 2022-03-27 DIAGNOSIS — K580 Irritable bowel syndrome with diarrhea: Secondary | ICD-10-CM | POA: Diagnosis not present

## 2022-03-27 DIAGNOSIS — K219 Gastro-esophageal reflux disease without esophagitis: Secondary | ICD-10-CM | POA: Diagnosis not present

## 2022-04-12 DIAGNOSIS — N3010 Interstitial cystitis (chronic) without hematuria: Secondary | ICD-10-CM | POA: Diagnosis not present

## 2022-04-16 ENCOUNTER — Encounter (HOSPITAL_COMMUNITY): Payer: Self-pay | Admitting: Emergency Medicine

## 2022-04-16 ENCOUNTER — Emergency Department (HOSPITAL_COMMUNITY)
Admission: EM | Admit: 2022-04-16 | Discharge: 2022-04-17 | Disposition: A | Discharge status: Home / Self Care | Payer: Medicare Other | Attending: Emergency Medicine | Admitting: Emergency Medicine

## 2022-04-16 ENCOUNTER — Emergency Department (HOSPITAL_COMMUNITY): Payer: Medicare Other

## 2022-04-16 DIAGNOSIS — J9811 Atelectasis: Secondary | ICD-10-CM | POA: Diagnosis not present

## 2022-04-16 DIAGNOSIS — R6889 Other general symptoms and signs: Secondary | ICD-10-CM | POA: Diagnosis not present

## 2022-04-16 DIAGNOSIS — R0789 Other chest pain: Secondary | ICD-10-CM | POA: Diagnosis not present

## 2022-04-16 DIAGNOSIS — I10 Essential (primary) hypertension: Secondary | ICD-10-CM | POA: Insufficient documentation

## 2022-04-16 DIAGNOSIS — Z79899 Other long term (current) drug therapy: Secondary | ICD-10-CM | POA: Insufficient documentation

## 2022-04-16 DIAGNOSIS — M25511 Pain in right shoulder: Secondary | ICD-10-CM | POA: Diagnosis not present

## 2022-04-16 DIAGNOSIS — R079 Chest pain, unspecified: Secondary | ICD-10-CM | POA: Diagnosis not present

## 2022-04-16 DIAGNOSIS — I2510 Atherosclerotic heart disease of native coronary artery without angina pectoris: Secondary | ICD-10-CM | POA: Diagnosis not present

## 2022-04-16 LAB — BASIC METABOLIC PANEL
Anion gap: 13 (ref 5–15)
BUN: 18 mg/dL (ref 8–23)
CO2: 19 mmol/L — ABNORMAL LOW (ref 22–32)
Calcium: 9.1 mg/dL (ref 8.9–10.3)
Chloride: 106 mmol/L (ref 98–111)
Creatinine, Ser: 1.27 mg/dL — ABNORMAL HIGH (ref 0.61–1.24)
GFR, Estimated: 59 mL/min — ABNORMAL LOW (ref >60)
Glucose, Bld: 93 mg/dL (ref 70–99)
Potassium: 4.1 mmol/L (ref 3.5–5.1)
Sodium: 138 mmol/L (ref 135–145)

## 2022-04-16 LAB — CBC
HCT: 46.1 % (ref 39.0–52.0)
Hemoglobin: 15.4 g/dL (ref 13.0–17.0)
MCH: 30.2 pg (ref 26.0–34.0)
MCHC: 33.4 g/dL (ref 30.0–36.0)
MCV: 90.4 fL (ref 80.0–100.0)
Platelets: 291 10*3/uL (ref 150–400)
RBC: 5.1 MIL/uL (ref 4.22–5.81)
RDW: 12.7 % (ref 11.5–15.5)
WBC: 9.5 10*3/uL (ref 4.0–10.5)
nRBC: 0 % (ref 0.0–0.2)

## 2022-04-16 LAB — TROPONIN I (HIGH SENSITIVITY): Troponin I (High Sensitivity): 4 ng/L (ref <18)

## 2022-04-16 NOTE — ED Provider Triage Note (Signed)
Emergency Medicine Provider Triage Evaluation Note  Erik Lolling Sr. , a 76 y.o. male  was evaluated in triage.  Pt complains of chest pain.  Pt was on the table getting ready to have surgery and had chest pain.  Pt was suppose to have rotator cuff surgery   Review of Systems  Positive: No current pain Negative: fever  Physical Exam  BP 129/77 (BP Location: Left Arm)   Pulse 100   Temp 98.6 F (37 C) (Oral)   Resp 18   SpO2 97%  Gen:   Awake, no distress   Resp:  Normal effort  MSK:   Sling right arm Other:    Medical Decision Making  Medically screening exam initiated at 4:06 PM.  Appropriate orders placed.  Erik Lolling Sr. was informed that the remainder of the evaluation will be completed by another provider, this initial triage assessment does not replace that evaluation, and the importance of remaining in the ED until their evaluation is complete.     Fransico Meadow, [place] 04/16/22 1608

## 2022-04-16 NOTE — ED Provider Notes (Signed)
[facility] EMERGENCY DEPARTMENT Provider Note   CSN: [id number] Arrival date & time: 04/16/22  1538     History {Add pertinent medical, surgical, social history, OB history to HPI:1} Chief Complaint  Patient presents with   Chest Pain    Erik Lolling Sr. is a 76 y.o. male with a history of hypertension, hyperlipidemia, rheumatoid arthritis, IBS, GERD, and CAD who presents to the ED with complaints of an episode of chest pain that occurred around 14:30 today. Patient reports he was scheduled for right upper extremity rotator cuff surgery today, after he had his nerve block, he was laying down waiting to be put to sleep he developed lower central/epigastric chest pain described as a soreness.  EMS was called who gave aspirin and nitroglycerin.  Patient states after receiving these his pain gradually started to improve.  He is currently pain-free.  No recurrence of pain.  No other alleviating or aggravating factors.  No history of similar pain.  He denies any associated nausea, vomiting, diaphoresis, lightheadedness, dyspnea, dizziness, or syncope.  HPI     Home Medications Prior to Admission medications   Medication Sig Start Date End Date Taking? Authorizing Provider  amitriptyline (ELAVIL) 50 MG tablet Take 1 tablet (50 mg total) by mouth at bedtime. 03/21/22   [name], MD  cholecalciferol (VITAMIN D3) 25 MCG (1000 UNIT) tablet Take 1,000 Units by mouth daily.    [provider]  famotidine (PEPCID) 20 MG tablet Take 20 mg by mouth daily as needed.    [provider]  gabapentin (NEURONTIN) 100 MG capsule Take 1 capsule (100 mg total) by mouth 3 (three) times daily. 02/13/22   [name], MD  LORazepam (ATIVAN) 0.5 MG tablet Take 0.5 tablets (0.25 mg total) by mouth 2 (two) times daily as needed for anxiety. 02/08/22   [name], MD  Melatonin 3 MG CAPS Take 3 mg by mouth at bedtime.    [provider]  pantoprazole (PROTONIX) 40  MG tablet Take 1 tablet (40 mg total) by mouth daily. 12/12/21   [name], MD  Polyethyl Glycol-Propyl Glycol (SYSTANE OP) Place 1 drop into both eyes 2 (two) times daily as needed (dry/irritated eyes).    [provider]  traMADol (ULTRAM) 50 MG tablet TAKE 1 TABLET BY MOUTH TWICE A DAY AS NEEDED 12/12/21   [name], MD      Allergies    Ambien [zolpidem], Bentyl [dicyclomine], Carafate [sucralfate], Lansoprazole, Lexapro [escitalopram oxalate], Other, Pneumovax [pneumococcal polysaccharide vaccine], Prilosec [omeprazole], Remeron [mirtazapine], Seroquel [quetiapine], Statins, Tizanidine, Tramadol, and Tylenol [acetaminophen]    Review of Systems   Review of Systems  Constitutional:  Negative for chills and fever.  Respiratory:  Negative for shortness of breath.   Cardiovascular:  Positive for chest pain. Negative for leg swelling.  Gastrointestinal:  Negative for nausea and vomiting.  Neurological:  Negative for syncope.  All other systems reviewed and are negative.  Physical Exam Updated Vital Signs BP (!) 149/72   Pulse 76   Temp 98.6 F (37 C) (Oral)   Resp 10   SpO2 98%  Physical Exam Vitals and nursing note reviewed.  Constitutional:      General: He is not in acute distress.    Appearance: He is well-developed. He is not toxic-appearing.  HENT:     Head: Normocephalic and atraumatic.  Eyes:     General:        Right eye: No discharge.  Left eye: No discharge.     Conjunctiva/sclera: Conjunctivae normal.  Cardiovascular:     Rate and Rhythm: Normal rate and regular rhythm.     Pulses:          Radial pulses are 2+ on the right side and 2+ on the left side.  Pulmonary:     Effort: No respiratory distress.     Breath sounds: Normal breath sounds. No wheezing or rales.  Abdominal:     General: There is no distension.     Palpations: Abdomen is soft.     Tenderness: There is no abdominal tenderness.  Musculoskeletal:     Cervical back: Neck  supple.     Right lower leg: No tenderness. No edema.     Left lower leg: No tenderness. No edema.     Comments: RUE S/p nerve block  Skin:    General: Skin is warm and dry.  Neurological:     Mental Status: He is alert.     Comments: Clear speech.   Psychiatric:        Behavior: Behavior normal.    ED Results / Procedures / Treatments   Labs (all labs ordered are listed, but only abnormal results are displayed) Labs Reviewed  BASIC METABOLIC PANEL - Abnormal; Notable for the following components:      Result Value   CO2 19 (*)    Creatinine, Ser 1.27 (*)    GFR, Estimated 59 (*)    All other components within normal limits  CBC  TROPONIN I (HIGH SENSITIVITY)  TROPONIN I (HIGH SENSITIVITY)    EKG None  Radiology DG Chest 2 View  Result Date: 04/16/2022 CLINICAL DATA:  Chest pain.  Pain onset after a nerve block. EXAM: CHEST - 2 VIEW COMPARISON:  Chest radiograph 06/30/2019 FINDINGS: Very low lung volumes limit assessment. The heart is normal in size for technique. Bibasilar atelectasis, right greater than left. No pulmonary edema, pneumothorax, or large pleural effusion. Grossly unremarkable osseous structures. IMPRESSION: Very low lung volumes with bibasilar atelectasis, right greater than left. Electronically Signed   By: Keith Rake M.D.   On: 04/16/2022 16:43    Procedures Procedures  {Document cardiac monitor, telemetry assessment procedure when appropriate:1}  Medications Ordered in ED Medications - No data to display  ED Course/ Medical Decision Making/ A&P                           Medical Decision Making Amount and/or Complexity of Data Reviewed Labs: ordered. Radiology: ordered.   Patient presents to the emergency department with chest pain. Patient nontoxic appearing, in no apparent distress, vitals without significant abnormality***. Fairly benign physical exam.   DDX including but not limited to: ACS, pulmonary embolism, dissection, pneumothorax,  pneumonia, arrhythmia, severe anemia, MSK, GERD, anxiety, abdominal process ***.   Additional history obtained:  Chart & nursing note reviewed.  CT A/P 12/2021: IMPRESSION: There is no evidence of intestinal obstruction or pneumoperitoneum. Appendix is not dilated.   There is 1.2 cm soft tissue density in the right lateral margin of the first portion of duodenum. This may be an artifact due to incomplete distention or suggest a polyp or some other neoplastic process.   There is wall thickening in the distal rectum which may be due to incomplete distention or neoplastic process in the wall.   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 There is no hydronephrosis. There is para-ostial  hernia containing short segment of small bowel loop without evidence of obstruction.   Possible 4 mm right renal stone. Multiple bilateral renal cysts. Diverticulosis of colon without signs of focal acute diverticulitis.   Other findings as described in the body of the report.     Electronically Signed   By: Elmer Picker M.D.   On: 12/18/2021 20:27    EKG: ***  Lab Tests:  I reviewed & interpreted labs including:  ***  Imaging Studies ordered:  I ordered and viewed the following imaging, agree with radiologist impression:  ***  ED Course:  I ordered medications including *** for ***  ***: RE-EVAL: ***  Heart Pathway Score ***- EKG without obvious acute ischemia, delta troponin negative, doubt ACS. Patient is low risk wells, PERC negative, doubt pulmonary embolism. Pain is not a tearing sensation, symmetric pulses, no widening of mediastinum on CXR, doubt dissection. Cardiac monitor reviewed, no notable arrhythmias or tachycardia ***  Based on patient's chief complaint, I considered admission might be necessary, however after reassuring ED workup feel patient is reasonable for discharge.   I discussed results, treatment plan, need for PCP  follow-up, and return precautions with the patient. Provided opportunity for questions, patient confirmed understanding and is in agreement with plan.   Critical Interventions: ***  Social determinants:  - ***  Portions of this note were generated with [employer]. Dictation errors may occur despite best attempts at proofreading.   {Document critical care time when appropriate:1} {Document review of labs and clinical decision tools ie heart score, Chads2Vasc2 etc:1}  {Document your independent review of radiology images, and any outside records:1} {Document your discussion with family members, caretakers, and with consultants:1} {Document social determinants of health affecting pt's care:1} {Document your decision making why or why not admission, treatments were needed:1} Final Clinical Impression(s) / ED Diagnoses Final diagnoses:  None    Rx / DC Orders ED Discharge Orders     None

## 2022-04-16 NOTE — ED Triage Notes (Signed)
Patient BIB [facility] from a surgery center for evaluation of dull substernal chest pain that started after a nerve block. Patient received '324mg'$  ASA, 1x SL NTG PTA. Pain 2/10 on arrival to ED. Patient denies shortness of breath, nausea, and is alert, oriented, and in no apparent distress at this time.

## 2022-04-17 LAB — HEPATIC FUNCTION PANEL
ALT: 14 U/L (ref 0–44)
AST: 20 U/L (ref 15–41)
Albumin: 3.4 g/dL — ABNORMAL LOW (ref 3.5–5.0)
Alkaline Phosphatase: 63 U/L (ref 38–126)
Bilirubin, Direct: 0.2 mg/dL (ref 0.0–0.2)
Indirect Bilirubin: 0.5 mg/dL (ref 0.3–0.9)
Total Bilirubin: 0.7 mg/dL (ref 0.3–1.2)
Total Protein: 6.6 g/dL (ref 6.5–8.1)

## 2022-04-17 LAB — LIPASE, BLOOD: Lipase: 32 U/L (ref 11–51)

## 2022-04-17 LAB — TROPONIN I (HIGH SENSITIVITY): Troponin I (High Sensitivity): 6 ng/L (ref <18)

## 2022-04-17 NOTE — ED Notes (Signed)
Pt ambulated independently with steady gait in the hallway, pt denies any chest pain or shortness of breath while ambulating

## 2022-04-17 NOTE — Discharge Instructions (Signed)
You were seen in the emergency department today for chest pain. Your work-up in the emergency department has been overall reassuring. Your labs have been fairly normal and or similar to previous blood work you have had done. Your EKG and the enzyme we use to check your heart did not show an acute heart attack at this time. Your chest x-ray showed some low lung volume- please discuss with your primary care provider.   We would like you to follow up closely with the cardiologist provided in your discharge instructions within 1-3 days, we have sent a referral for them to call you to schedule an appointment. If you do not hear from them we have also provided their contact information. Return to the ER immediately should you experience any new or worsening symptoms including but not limited to return of pain, worsened pain, vomiting, shortness of breath, dizziness, lightheadedness, passing out, or any other concerns that you may have.

## 2022-04-19 NOTE — Progress Notes (Signed)
Cardiology Office Note:   Date:  04/20/2022  NAME:  Erik PETSCH Sr.    MRN: [id number] DOB:  [birth year]   PCP:  [name], MD  Cardiologist:  None  Electrophysiologist:  None   Referring MD: Amaryllis Dyke,*   Chief Complaint  Patient presents with   Chest Pain    History of Present Illness:   Erik Lolling Sr. is a 76 y.o. male with a hx of CAD, HTN, HLD who is being seen today for the evaluation of chest pain at the request of [name], MD. Seen in the ER for CP.  He reports he was at the surgical center and was undergoing shoulder surgery.  Apparently the case had not started.  He was being given anesthesia and the breathing mask was placed and he developed chest pain.  He reports it felt like dullness in his chest.  Felt sore.  He reports some anxiety.  He is on amitriptyline and other medications for anxiety.  Noticeably tachycardic at most recent primary care physician visit.  He reports symptoms resolved with aspirin as well as nitroglycerin.  He was taken to the emergency room and ruled out for an acute coronary syndrome.  His EKG demonstrates sinus rhythm with an inferior infarct possibly.  He had a left heart catheterization in 2007 that showed a 50% LAD lesion.  He has been off cholesterol-lowering agents since that time.  He reports he cannot tolerate statin therapy.  His blood pressure is well controlled.  He has never had a heart attack or stroke.  He does suffer from anxiety and has been on Ativan.  Now on amitriptyline.  I did inform this is not a good medication.  Recent TSH is normal.  He is not anemic.  He is prediabetic.  He is a never smoker.  No alcohol or drug use is reported.  He presents with his wife.  He is not exercising due to arthritis.  He had some dullness in his chest last night.  He has not taken any further nitroglycerin pills.   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 HTN 3. HLD -Tchol 238, HDL 47, LDL 153, TG  185  Past Medical History: Past Medical History:  Diagnosis Date   Allergic rhinitis    Allergy    Anal fissure    Anemia    Anxiety    Blood transfusion without reported diagnosis    Cataract    starting   Coronary atherosclerosis of native coronary artery 2007   nonobstructive CAD by cath with 40% mid-LAD stenosis   Degenerative arthritis of knee, bilateral 10/09/2017   Depression    Diverticulosis 12/18/2011   Diverticulosis of colon with hemorrhage    April 2021, [facility]   Duodenal mass    Fibromyalgia    GERD with stricture    Helicobacter pylori (H. 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   Vitamin D deficiency 11/29/2017    Past Surgical History: Past Surgical History:  Procedure Laterality Date   bladder distention     x 6   CARDIAC CATHETERIZATION     CATHETER REMOVAL     super pubic area   COLONOSCOPY     DENTAL SURGERY Right    #30 tooth extraction (right upper)   INTERSTIM IMPLANT PLACEMENT     INTERSTIM IMPLANT REMOVAL     KNEE ARTHROSCOPY     right x 2   NISSEN FUNDOPLICATION  4010   PROSTECTOMY     ROBOT ASSISTED LAPAROSCOPIC COMPLETE CYSTECT ILEAL CONDUIT     SHOULDER ARTHROSCOPY  2010   left   TONSILLECTOMY AND ADENOIDECTOMY  1957   TOTAL KNEE ARTHROPLASTY Right 09/08/2021   Procedure: TOTAL KNEE ARTHROPLASTY;  Surgeon: Susa Day, MD;  Location: [facility];  Service: Orthopedics;  Laterality: Right;   TRANSURETHRAL RESECTION  OF PROSTATE     x 2   UPPER GASTROINTESTINAL ENDOSCOPY  05/13/2008   hiatal hernia   VASECTOMY      Current Medications: Current Meds  Medication Sig   amitriptyline (ELAVIL) 50 MG tablet Take 1 tablet (50 mg total) by mouth at bedtime.   aspirin EC 81 MG tablet Take 1 tablet (81 mg total) by mouth daily. Swallow whole.   cholecalciferol (VITAMIN D3) 25 MCG (1000 UNIT) tablet Take 1,000 Units by mouth daily.   famotidine (PEPCID) 20 MG tablet Take 20 mg by mouth daily as needed.   gabapentin (NEURONTIN) 100 MG capsule Take 1 capsule (100 mg total) by mouth 3 (three) times daily.   LORazepam (ATIVAN) 0.5 MG tablet Take 0.5 tablets (0.25 mg total) by mouth 2 (two) times daily as needed for anxiety.   Melatonin 3 MG CAPS Take 3 mg by mouth at bedtime.   metoprolol tartrate (LOPRESSOR) 100 MG tablet Take 1 tablet by mouth once for procedure.   nitroGLYCERIN (NITROSTAT) 0.4 MG SL tablet Place 1 tablet (0.4 mg total) under the tongue every 5 (five) minutes as needed for chest pain.   pantoprazole (PROTONIX) 40 MG tablet Take 1 tablet (40 mg total) by mouth daily.   Polyethyl Glycol-Propyl Glycol (SYSTANE OP) Place 1 drop into both eyes 2 (two) times daily as needed (dry/irritated eyes).   traMADol (ULTRAM) 50 MG tablet TAKE 1 TABLET BY MOUTH TWICE A DAY AS NEEDED     Allergies:    Ambien [zolpidem], Bentyl [dicyclomine], Carafate [sucralfate], Lansoprazole, Lexapro [escitalopram oxalate], Other, Pneumovax [pneumococcal polysaccharide vaccine], Prilosec [omeprazole], Remeron [mirtazapine], Seroquel [quetiapine], Statins, Tizanidine, Tramadol, and Tylenol [acetaminophen]   Social History: Social History   Socioeconomic History   Marital status: Married    Spouse name: Not on file   Number of children: 4   Years of education: Not on file   Highest education level: Not on file  Occupational History   Occupation: retired  Tobacco Use   Smoking status: Former   Smokeless tobacco: Never    Tobacco comments:    quit 1980  Vaping Use   Vaping Use: Never used  Substance and Sexual Activity   Alcohol use: No    Alcohol/week: 0.0 standard drinks of alcohol   Drug use: No   Sexual activity: Yes    Partners: Female  Other Topics Concern   Not on file  Social History Narrative   Married, retired for children   He is a former smoker no alcohol or substance use  Social Determinants of Health   Financial Resource Strain: Low Risk  (12/08/2021)   Overall Financial Resource Strain (CARDIA)    Difficulty of Paying Living Expenses: Not hard at all  Food Insecurity: No Food Insecurity (12/08/2021)   Hunger Vital Sign    Worried About Running Out of Food in the Last Year: Never true    Ran Out of Food in the Last Year: Never true  Transportation Needs: No Transportation Needs (12/08/2021)   PRAPARE - Hydrologist (Medical): No    Lack of Transportation (Non-Medical): No  Physical Activity: Insufficiently Active (12/08/2021)   Exercise Vital Sign    Days of Exercise per Week: 2 days    Minutes of Exercise per Session: 30 min  Stress: No Stress Concern Present (12/08/2021)   [place]    Feeling of Stress : Only a little  Social Connections: Moderately Integrated (12/08/2021)   Social Connection and Isolation Panel [NHANES]    Frequency of Communication with Friends and Family: Three times a week    Frequency of Social Gatherings with Friends and Family: Three times a week    Attends Religious Services: More than 4 times per year    Active Member of Clubs or Organizations: No    Attends Archivist Meetings: Never    Marital Status: Married     Family History: The patient's family history includes Arthritis in his mother; Breast cancer in his sister; Colon polyps in his father; Diabetes in his father, paternal uncle, and sister; Heart disease in his father and mother;  Hypertension in his father and mother; Liver cancer in his maternal grandfather and paternal uncle; Stroke in his mother. There is no history of Colon cancer, Esophageal cancer, Rectal cancer, Stomach cancer, or Pancreatic cancer.  ROS:   All other ROS reviewed and negative. Pertinent positives noted in the HPI.     EKGs/Labs/Other Studies Reviewed:   The following studies were personally reviewed by me today:  EKG:  EKG is  ordered today.  The ekg ordered today demonstrates normal sinus rhythm rate 94, inferior infarct, and was personally reviewed by me.   Recent Labs: 01/22/2022: TSH 3.25 04/16/2022: ALT 14; BUN 18; Creatinine, Ser 1.27; Hemoglobin 15.4; Platelets 291; Potassium 4.1; Sodium 138   Recent Lipid Panel    Component Value Date/Time   CHOL 238 (H) 01/22/2022 1049   TRIG 185.0 (H) 01/22/2022 1049   HDL 47.10 01/22/2022 1049   CHOLHDL 5 01/22/2022 1049   VLDL 37.0 01/22/2022 1049   LDLCALC 153 (H) 01/22/2022 1049   LDLDIRECT 169.0 06/24/2018 1408    Physical Exam:   VS:  BP 136/86 (BP Location: Right Arm, Patient Position: Sitting, Cuff Size: Normal)   Pulse 94   Ht '5\' 7"'$  (1.702 m)   Wt 164 lb (74.4 kg)   SpO2 96%   BMI 25.69 kg/m    Wt Readings from Last 3 Encounters:  04/20/22 164 lb (74.4 kg)  02/09/22 167 lb (75.8 kg)  02/08/22 165 lb 12.8 oz (75.2 kg)    General: Well nourished, well developed, in no acute distress Head: Atraumatic, normal size  Eyes: PEERLA, EOMI  Neck: Supple, no JVD Endocrine: No thryomegaly Cardiac: Normal S1, S2; RRR; no murmurs, rubs, or gallops Lungs: Clear to auscultation bilaterally, no wheezing, rhonchi or rales  Abd: Soft, nontender, no hepatomegaly  Ext: No edema, pulses 2+ Musculoskeletal: No deformities, BUE and BLE strength normal  and equal Skin: Warm and dry, no rashes   Neuro: Alert and oriented to person, place, time, and situation, CNII-XII grossly intact, no focal deficits  Psych: Normal mood and affect    ASSESSMENT:   Erik ESGUERRA Sr. is a 76 y.o. male who presents for the following: 1. Precordial pain   2. Mixed hyperlipidemia     PLAN:   1. Precordial pain 2. Mixed hyperlipidemia -He presents with dullness in his chest after attempting to undergo shoulder surgery.  EKG without acute changes.  Troponins were negative in the emergency room.  He did have a heart catheterization in 2007 with a 50% LAD lesion.  He has been on no statin lowering agents.  He cannot tolerate statins due to arthritis.  I do have concerns that he may have progression of disease.  He did not have an acute coronary syndrome.  We will start aspirin 81 mg daily.  He will get a coronary CTA with 100 mg metoprolol tartrate 2 hours before the scan.  We will also set him up for an echo.  He does not need a BMP.  We will also give him a prescription for nitroglycerin.  He is currently not having any chest pain symptoms.  Hopefully that will remain the case.  Of note, he is also been started on amitriptyline recently.  This is also coincided with tachycardia and his symptoms.  I recommended he reach out to his prescribing physician to determine if he can be on an alternate agent.  I do not believe this is a good medication given his age.  Disposition: Return in about 3 months (around 07/21/2022).  Medication Adjustments/Labs and Tests Ordered: Current medicines are reviewed at length with the patient today.  Concerns regarding medicines are outlined above.  Orders Placed This Encounter  Procedures   CT CORONARY MORPH W/CTA COR W/SCORE W/CA W/CM &/OR WO/CM   EKG 12-Lead   ECHOCARDIOGRAM COMPLETE   Meds ordered this encounter  Medications   metoprolol tartrate (LOPRESSOR) 100 MG tablet    Sig: Take 1 tablet by mouth once for procedure.    Dispense:  1 tablet    Refill:  0   nitroGLYCERIN (NITROSTAT) 0.4 MG SL tablet    Sig: Place 1 tablet (0.4 mg total) under the tongue every 5 (five) minutes as needed for chest pain.     Dispense:  25 tablet    Refill:  3   aspirin EC 81 MG tablet    Sig: Take 1 tablet (81 mg total) by mouth daily. Swallow whole.    Dispense:  90 tablet    Refill:  3    Patient Instructions  Medication Instructions:  START Aspirin USE Nitroglycerin as prescribed. TAKE Metoprolol 100 mg two hours before CT when scheduled.   *If you need a refill on your cardiac medications before your next appointment, please call your pharmacy*   Testing/Procedures: Your physician has requested that you have cardiac CT. Cardiac computed tomography (CT) is a painless test that uses an x-ray machine to take clear, detailed pictures of your heart. For further information please visit HugeFiesta.tn. Please follow instruction sheet as given.  Echocardiogram - Your physician has requested that you have an echocardiogram. Echocardiography is a painless test that uses sound waves to create images of your heart. It provides your doctor with information about the size and shape of your heart and how well your heart's chambers and valves are working. This procedure takes approximately one hour. There  are no restrictions for this procedure.    Follow-Up: At [facility], you and your health needs are our priority.  As part of our continuing mission to provide you with exceptional heart care, we have created designated Provider Care Teams.  These Care Teams include your primary Cardiologist (physician) and Advanced Practice Providers (APPs -  Physician Assistants and Nurse Practitioners) who all work together to provide you with the care you need, when you need it.  We recommend signing up for the patient portal called "MyChart".  Sign up information is provided on this After Visit Summary.  MyChart is used to connect with patients for Virtual Visits (Telemedicine).  Patients are able to view lab/test results, encounter notes, upcoming appointments, etc.  Non-urgent messages can be sent to your provider as  well.   To learn more about what you can do with MyChart, go to [email or web address].    Your next appointment:   3 month(s)  The format for your next appointment:   In Person  Provider:   Eleonore Chiquito, MD     Other Instructions   Your cardiac CT will be scheduled at one of the below locations:   [facility] [address] [place], [place] [address] [phone]  If scheduled at [facility], please arrive at the [facility] and Children's Entrance (Entrance C2) of [facility] 30 minutes prior to test start time. You can use the FREE valet parking offered at entrance C (encouraged to control the heart rate for the test)  Proceed to the [facility] Radiology Department (first floor) to check-in and test prep.  All radiology patients and guests should use entrance C2 at [facility], accessed from [facility], even though the hospital's physical address listed is [address]      Please follow these instructions carefully (unless otherwise directed):  Hold all erectile dysfunction medications at least 3 days (72 hrs) prior to test.  On the Night Before the Test: Be sure to Drink plenty of water. Do not consume any caffeinated/decaffeinated beverages or chocolate 12 hours prior to your test. Do not take any antihistamines 12 hours prior to your test.  On the Day of the Test: Drink plenty of water until 1 hour prior to the test. Do not eat any food 4 hours prior to the test. You may take your regular medications prior to the test.  Take metoprolol (Lopressor) two hours prior to test. HOLD Furosemide/Hydrochlorothiazide morning of the test.       After the Test: Drink plenty of water. After receiving IV contrast, you may experience a mild flushed feeling. This is normal. On occasion, you may experience a mild rash up to 24 hours after the test. This is not dangerous. If this occurs, you can take Benadryl 25  mg and increase your fluid intake. If you experience trouble breathing, this can be serious. If it is severe call 911 IMMEDIATELY. If it is mild, please call our office. If you take any of these medications: Glipizide/Metformin, Avandament, Glucavance, please do not take 48 hours after completing test unless otherwise instructed.  We will call to schedule your test 2-4 weeks out understanding that some insurance companies will need an authorization prior to the service being performed.   For non-scheduling related questions, please contact the cardiac imaging nurse navigator should you have any questions/concerns: Marchia Bond, Cardiac Imaging Nurse Navigator Gordy Clement, Cardiac Imaging Nurse Navigator [facility] Direct  Office Dial: [phone]   For scheduling needs, including cancellations and rescheduling, please call [place], [phone].            Signed, Addison Naegeli. Audie Box, MD, [place]  [address] [place], [place] [address] [phone]  04/20/2022 2:53 PM

## 2022-04-20 ENCOUNTER — Encounter: Payer: Self-pay | Admitting: Cardiovascular Disease

## 2022-04-20 ENCOUNTER — Ambulatory Visit (INDEPENDENT_AMBULATORY_CARE_PROVIDER_SITE_OTHER): Payer: Medicare Other | Admitting: Cardiovascular Disease

## 2022-04-20 VITALS — BP 136/86 | HR 94 | Ht 67.0 in | Wt 164.0 lb

## 2022-04-20 DIAGNOSIS — E782 Mixed hyperlipidemia: Secondary | ICD-10-CM

## 2022-04-20 DIAGNOSIS — R072 Precordial pain: Secondary | ICD-10-CM | POA: Diagnosis not present

## 2022-04-20 MED ORDER — NITROGLYCERIN 0.4 MG SL SUBL
0.4000 mg | SUBLINGUAL_TABLET | SUBLINGUAL | 3 refills | Status: DC | PRN
Start: 2022-04-20 — End: 2022-05-17

## 2022-04-20 MED ORDER — ASPIRIN 81 MG PO TBEC: 81.0000 mg | DELAYED_RELEASE_TABLET | Freq: Every day | ORAL | 3 refills | Status: DC

## 2022-04-20 MED ORDER — METOPROLOL TARTRATE 100 MG PO TABS: ORAL_TABLET | ORAL | 0 refills | Status: DC

## 2022-04-20 NOTE — Patient Instructions (Addendum)
Medication Instructions:  START Aspirin USE Nitroglycerin as prescribed. TAKE Metoprolol 100 mg two hours before CT when scheduled.   *If you need a refill on your cardiac medications before your next appointment, please call your pharmacy*   Testing/Procedures: Your physician has requested that you have cardiac CT. Cardiac computed tomography (CT) is a painless test that uses an x-ray machine to take clear, detailed pictures of your heart. For further information please visit HugeFiesta.tn. Please follow instruction sheet as given.  Echocardiogram - Your physician has requested that you have an echocardiogram. Echocardiography is a painless test that uses sound waves to create images of your heart. It provides your doctor with information about the size and shape of your heart and how well your heart's chambers and valves are working. This procedure takes approximately one hour. There are no restrictions for this procedure.    Follow-Up: At [facility], you and your health needs are our priority.  As part of our continuing mission to provide you with exceptional heart care, we have created designated Provider Care Teams.  These Care Teams include your primary Cardiologist (physician) and Advanced Practice Providers (APPs -  Physician Assistants and Nurse Practitioners) who all work together to provide you with the care you need, when you need it.  We recommend signing up for the patient portal called "MyChart".  Sign up information is provided on this After Visit Summary.  MyChart is used to connect with patients for Virtual Visits (Telemedicine).  Patients are able to view lab/test results, encounter notes, upcoming appointments, etc.  Non-urgent messages can be sent to your provider as well.   To learn more about what you can do with MyChart, go to [email or web address].    Your next appointment:   3 month(s)  The format for your next appointment:   In Person  Provider:    Eleonore Chiquito, MD     Other Instructions   Your cardiac CT will be scheduled at one of the below locations:   [facility] [address] [place], [place] [address] [phone]  If scheduled at [facility], please arrive at the [facility] and Children's Entrance (Entrance C2) of [facility] 30 minutes prior to test start time. You can use the FREE valet parking offered at entrance C (encouraged to control the heart rate for the test)  Proceed to the [facility] Radiology Department (first floor) to check-in and test prep.  All radiology patients and guests should use entrance C2 at [facility], accessed from [facility], even though the hospital's physical address listed is [address]      Please follow these instructions carefully (unless otherwise directed):  Hold all erectile dysfunction medications at least 3 days (72 hrs) prior to test.  On the Night Before the Test: Be sure to Drink plenty of water. Do not consume any caffeinated/decaffeinated beverages or chocolate 12 hours prior to your test. Do not take any antihistamines 12 hours prior to your test.  On the Day of the Test: Drink plenty of water until 1 hour prior to the test. Do not eat any food 4 hours prior to the test. You may take your regular medications prior to the test.  Take metoprolol (Lopressor) two hours prior to test. HOLD Furosemide/Hydrochlorothiazide morning of the test.       After the Test: Drink plenty of water. After receiving IV contrast, you may experience a mild flushed feeling. This is normal. On occasion,  you may experience a mild rash up to 24 hours after the test. This is not dangerous. If this occurs, you can take Benadryl 25 mg and increase your fluid intake. If you experience trouble breathing, this can be serious. If it is severe call 911 IMMEDIATELY. If it is mild, please call our office. If you take any of these  medications: Glipizide/Metformin, Avandament, Glucavance, please do not take 48 hours after completing test unless otherwise instructed.  We will call to schedule your test 2-4 weeks out understanding that some insurance companies will need an authorization prior to the service being performed.   For non-scheduling related questions, please contact the cardiac imaging nurse navigator should you have any questions/concerns: Marchia Bond, Cardiac Imaging Nurse Navigator Gordy Clement, Cardiac Imaging Nurse Navigator [facility] Direct Office Dial: [phone]   For scheduling needs, including cancellations and rescheduling, please call [place], [phone].

## 2022-04-21 DIAGNOSIS — Z932 Ileostomy status: Secondary | ICD-10-CM | POA: Diagnosis not present

## 2022-04-23 ENCOUNTER — Encounter: Payer: Self-pay | Admitting: Cardiovascular Disease

## 2022-04-26 DIAGNOSIS — Z932 Ileostomy status: Secondary | ICD-10-CM | POA: Diagnosis not present

## 2022-05-03 ENCOUNTER — Telehealth: Payer: Self-pay

## 2022-05-03 NOTE — Telephone Encounter (Signed)
   Pre-operative Risk Assessment    Patient Name: Erik LOCKRIDGE Sr.  DOB: [birth year] MRN: [id number]      Request for Surgical Clearance    Procedure:   Right Shoulder Scope, SAD, Mini Open RCR, Labral Debridement, Possible Distal Clavicle Resection  Date of Surgery:  Clearance TBD                                 Surgeon:  Dr. Susa Day Surgeon's Group or Practice Name:  [facility] Phone number:  [phone] Fax number:  [phone] [name]   Type of Clearance Requested:   - Medical    Type of Anesthesia:   Choice   Additional requests/questions:  Please advise surgeon/provider what medications should be held.  Signed, Elsie Lincoln Aycen Porreca   05/03/2022, 2:30 PM

## 2022-05-08 ENCOUNTER — Ambulatory Visit (HOSPITAL_COMMUNITY): Payer: Medicare Other

## 2022-05-14 ENCOUNTER — Telehealth (HOSPITAL_COMMUNITY): Payer: Self-pay | Admitting: *Deleted

## 2022-05-14 NOTE — Telephone Encounter (Signed)
Attempted to call patient regarding upcoming cardiac CT appointment. °Left message on voicemail with name and callback number ° °Dallis Darden RN Navigator Cardiac Imaging °[facility] °[phone] Office °[phone] Cell ° °

## 2022-05-14 NOTE — Telephone Encounter (Signed)
Reaching out to patient to offer assistance regarding upcoming cardiac imaging study; pt verbalizes understanding of appt date/time, parking situation and where to check in, pre-test NPO status and medications ordered, and verified current allergies; name and call back number provided for further questions should they arise  Gordy Clement RN Navigator Cardiac Imaging [facility] [phone] office [phone] cell  Patient to take '100mg'$  metoprolol tartrate two hours prior to his cardiac CT scan.  He is aware to arrive at 2:30pm.

## 2022-05-16 ENCOUNTER — Other Ambulatory Visit: Payer: Self-pay | Admitting: Cardiovascular Disease

## 2022-05-16 ENCOUNTER — Ambulatory Visit (HOSPITAL_COMMUNITY)
Admission: RE | Admit: 2022-05-16 | Discharge: 2022-05-16 | Disposition: A | Discharge status: Home / Self Care | Payer: Medicare Other | Source: Ambulatory Visit | Attending: Cardiovascular Disease | Admitting: Cardiovascular Disease

## 2022-05-16 DIAGNOSIS — I2510 Atherosclerotic heart disease of native coronary artery without angina pectoris: Secondary | ICD-10-CM | POA: Diagnosis not present

## 2022-05-16 DIAGNOSIS — R931 Abnormal findings on diagnostic imaging of heart and coronary circulation: Secondary | ICD-10-CM | POA: Insufficient documentation

## 2022-05-16 DIAGNOSIS — R072 Precordial pain: Secondary | ICD-10-CM | POA: Diagnosis not present

## 2022-05-16 MED ORDER — IOHEXOL 350 MG/ML SOLN
100.0000 mL | Freq: Once | INTRAVENOUS | Status: AC | PRN
  Administered 2022-05-16: 100 mL via INTRAVENOUS

## 2022-05-16 MED ORDER — NITROGLYCERIN 0.4 MG SL SUBL
0.8000 mg | SUBLINGUAL_TABLET | Freq: Once | SUBLINGUAL | Status: AC
  Administered 2022-05-16: 0.8 mg via SUBLINGUAL

## 2022-05-16 MED ORDER — NITROGLYCERIN 0.4 MG SL SUBL
SUBLINGUAL_TABLET | SUBLINGUAL | Status: AC
  Filled 2022-05-16: qty 2

## 2022-05-16 NOTE — Progress Notes (Signed)
Ct ffr ordered.   [place] T. Audie Box, MD, [place]  [address], [place], [place] [address] [phone]  5:28 PM

## 2022-05-17 ENCOUNTER — Ambulatory Visit (HOSPITAL_COMMUNITY)
Admission: RE | Admit: 2022-05-17 | Discharge: 2022-05-17 | Disposition: A | Discharge status: Home / Self Care | Payer: Medicare Other | Source: Ambulatory Visit | Attending: Cardiovascular Disease | Admitting: Cardiovascular Disease

## 2022-05-17 ENCOUNTER — Other Ambulatory Visit: Payer: Self-pay

## 2022-05-17 ENCOUNTER — Ambulatory Visit (HOSPITAL_BASED_OUTPATIENT_CLINIC_OR_DEPARTMENT_OTHER)
Admission: RE | Admit: 2022-05-17 | Discharge: 2022-05-17 | Disposition: A | Discharge status: Home / Self Care | Payer: Medicare Other | Source: Ambulatory Visit | Attending: Cardiovascular Disease | Admitting: Cardiovascular Disease

## 2022-05-17 DIAGNOSIS — R931 Abnormal findings on diagnostic imaging of heart and coronary circulation: Secondary | ICD-10-CM

## 2022-05-17 DIAGNOSIS — R072 Precordial pain: Secondary | ICD-10-CM | POA: Diagnosis not present

## 2022-05-17 MED ORDER — NITROGLYCERIN 0.4 MG SL SUBL: 0.4000 mg | SUBLINGUAL_TABLET | SUBLINGUAL | 3 refills | Status: DC | PRN

## 2022-05-17 NOTE — Progress Notes (Signed)
Cardiology Office Note:   Date:  05/18/2022  NAME:  Erik INNES Sr.    MRN: [id number] DOB:  [birth year]   PCP:  [name], MD  Cardiologist:  None  Electrophysiologist:  None   Referring MD: [name], MD   Chief Complaint  Patient presents with   Coronary Artery Disease   History of Present Illness:   Erik Lolling Sr. is a 76 y.o. male with a hx of CAD who presents for follow-up.  He reports he is getting sharp chest discomfort at night.  Reports tightness and shortness of breath.  Symptoms do not occur with exertion.  He had chest discomfort when undergoing shoulder surgery with anesthesia.  The procedure was stopped.  Coronary CTA shows severe proximal LAD stenosis.  He has severe reduction in blood flow based on FFR analysis.  The location is alarming.  I have recommended PCI.  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  He was admitted to [facility] in 2021 with massive GI bleed.  Recent EGD in March of this year shows gastritis.  No bleeding.  Hemoglobin was stable.  We will recheck labs.  We will set him up for PCI in 2 weeks.  I will reach out to Dr. Carlean Purl to determine if he has any recommendations.  He is statin intolerant.  Without exertional symptoms.  EKG shows nonspecific ST-T changes.  Echocardiogram is pending.  Problem List CAD -70-99% pLAD; CT FFR 0.57 2. HTN 3. HLD -Tchol 238, HDL 47, LDL 153, TG 185 -Statin intolerant 4.  Gastritis -Massive GI bleed [facility] in 2021 -EGD 01/2022 with gastritis  Past Medical History: Past Medical History:  Diagnosis Date   Allergic rhinitis    Allergy    Anal fissure    Anemia    Anxiety    Blood transfusion without reported diagnosis    Cataract    starting   Coronary atherosclerosis of native coronary artery 2007   nonobstructive CAD by cath with 40% mid-LAD stenosis   Degenerative arthritis of knee, bilateral 10/09/2017   Depression    Diverticulosis 12/18/2011   Diverticulosis  of colon with hemorrhage    April 2021, [facility]   Duodenal mass    Fibromyalgia    GERD with stricture    Helicobacter pylori (H. 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   Vitamin D deficiency 11/29/2017    Past Surgical History: Past Surgical History:  Procedure Laterality Date   bladder distention     x 6   CARDIAC CATHETERIZATION     CATHETER REMOVAL     super pubic area   COLONOSCOPY     DENTAL SURGERY Right    #30 tooth extraction (right upper)   INTERSTIM IMPLANT PLACEMENT     INTERSTIM IMPLANT REMOVAL     KNEE ARTHROSCOPY     right  x 2   NISSEN FUNDOPLICATION  4782   PROSTECTOMY     ROBOT ASSISTED LAPAROSCOPIC COMPLETE CYSTECT ILEAL CONDUIT     SHOULDER ARTHROSCOPY  2010   left   TONSILLECTOMY AND ADENOIDECTOMY  1957   TOTAL KNEE ARTHROPLASTY Right 09/08/2021   Procedure: TOTAL KNEE ARTHROPLASTY;  Surgeon: Susa Day, MD;  Location: [facility] ORS;  Service: Orthopedics;  Laterality: Right;   TRANSURETHRAL RESECTION OF PROSTATE     x 2   UPPER GASTROINTESTINAL ENDOSCOPY  05/13/2008   hiatal hernia   VASECTOMY      Current Medications: Current Meds  Medication Sig   amLODipine (NORVASC) 2.5 MG tablet Take 1 tablet (2.5 mg total) by mouth daily.   cholecalciferol (VITAMIN D3) 25 MCG (1000 UNIT) tablet Take 1,000 Units by mouth daily.   ezetimibe (ZETIA) 10 MG tablet Take 1 tablet (10 mg total) by mouth daily.   LORazepam (ATIVAN) 0.5 MG tablet  Take 0.5 tablets (0.25 mg total) by mouth 2 (two) times daily as needed for anxiety.   Polyethyl Glycol-Propyl Glycol (SYSTANE OP) Place 1 drop into both eyes 2 (two) times daily as needed (dry/irritated eyes).   traMADol (ULTRAM) 50 MG tablet TAKE 1 TABLET BY MOUTH TWICE A DAY AS NEEDED   [DISCONTINUED] famotidine (PEPCID) 20 MG tablet Take 20 mg by mouth daily as needed.     Allergies:    Ambien [zolpidem], Bentyl [dicyclomine], Carafate [sucralfate], Lansoprazole, Lexapro [escitalopram oxalate], Other, Pneumovax [pneumococcal polysaccharide vaccine], Prilosec [omeprazole], Remeron [mirtazapine], Seroquel [quetiapine], Statins, Tizanidine, Tramadol, and Tylenol [acetaminophen]   Social History: Social History   Socioeconomic History   Marital status: Married    Spouse name: Not on file   Number of children: 4   Years of education: Not on file   Highest education level: Not on file  Occupational History   Occupation: retired  Tobacco Use   Smoking status: Former   Smokeless tobacco: Never   Tobacco comments:    quit 1980  Vaping Use   Vaping Use: Never used  Substance and Sexual Activity   Alcohol use: No    Alcohol/week: 0.0 standard drinks of alcohol   Drug use: No   Sexual activity: Yes    Partners: Female  Other Topics Concern   Not on file  Social History Narrative   Married, retired for children   He is a former smoker no alcohol or substance use   Social Determinants of Radio broadcast assistant Strain: Low Risk  (12/08/2021)   Overall Financial Resource Strain (CARDIA)    Difficulty of Paying Living Expenses: Not hard at all  Food Insecurity: No Food Insecurity (12/08/2021)   Hunger Vital Sign    Worried About Running Out of Food in the Last Year: Never true    Ran Out of Food in the Last Year: Never true  Transportation Needs: No Transportation Needs (12/08/2021)   PRAPARE - Hydrologist (Medical): No    Lack of Transportation  (Non-Medical): No  Physical Activity: Insufficiently Active (12/08/2021)   Exercise Vital Sign    Days of Exercise per Week: 2 days    Minutes of Exercise per Session: 30 min  Stress: No Stress Concern Present (12/08/2021)   [place]    Feeling of Stress : Only a little  Social Connections: Moderately Integrated (12/08/2021)   Social Connection and Isolation Panel [NHANES]    Frequency of Communication with Friends and  Family: Three times a week    Frequency of Social Gatherings with Friends and Family: Three times a week    Attends Religious Services: More than 4 times per year    Active Member of Clubs or Organizations: No    Attends Archivist Meetings: Never    Marital Status: Married     Family History: The patient's family history includes Arthritis in his mother; Breast cancer in his sister; Colon polyps in his father; Diabetes in his father, paternal uncle, and sister; Heart disease in his father and mother; Hypertension in his father and mother; Liver cancer in his maternal grandfather and paternal uncle; Stroke in his mother. There is no history of Colon cancer, Esophageal cancer, Rectal cancer, Stomach cancer, or Pancreatic cancer.  ROS:   All other ROS reviewed and negative. Pertinent positives noted in the HPI.     EKGs/Labs/Other Studies Reviewed:   The following studies were personally reviewed by me today:  EKG:  EKG is ordered today.  The ekg ordered today demonstrates normal sinus rhythm heart rate 62, nonspecific ST-T changes, and was personally reviewed by me.   CCTA 05/16/2022 IMPRESSION: 1. Coronary calcium score of 675. This was 70th percentile for age-, sex, and race-matched controls. Plaque volume 549 mm3.   2. Normal coronary origin with right dominance.   3. Severe stenosis in the proximal LAD (70-99%).   4. Minimal CAD (<25%) in the LCX/RCA.  Recent Labs: 01/22/2022: TSH  3.25 04/16/2022: ALT 14; BUN 18; Creatinine, Ser 1.27; Hemoglobin 15.4; Platelets 291; Potassium 4.1; Sodium 138   Recent Lipid Panel    Component Value Date/Time   CHOL 238 (H) 01/22/2022 1049   TRIG 185.0 (H) 01/22/2022 1049   HDL 47.10 01/22/2022 1049   CHOLHDL 5 01/22/2022 1049   VLDL 37.0 01/22/2022 1049   LDLCALC 153 (H) 01/22/2022 1049   LDLDIRECT 169.0 06/24/2018 1408    Physical Exam:   VS:  BP 128/62 (BP Location: Left Arm, Patient Position: Sitting, Cuff Size: Large)   Pulse 62   Ht '5\' 6"'$  (1.676 m)   Wt 168 lb 6.4 oz (76.4 kg)   SpO2 96%   BMI 27.18 kg/m    Wt Readings from Last 3 Encounters:  05/18/22 168 lb 6.4 oz (76.4 kg)  04/20/22 164 lb (74.4 kg)  02/09/22 167 lb (75.8 kg)    General: Well nourished, well developed, in no acute distress Head: Atraumatic, normal size  Eyes: PEERLA, EOMI  Neck: Supple, no JVD Endocrine: No thryomegaly Cardiac: Normal S1, S2; RRR; no murmurs, rubs, or gallops Lungs: Clear to auscultation bilaterally, no wheezing, rhonchi or rales  Abd: Soft, nontender, no hepatomegaly  Ext: No edema, pulses 2+ Musculoskeletal: No deformities, BUE and BLE strength normal and equal Skin: Warm and dry, no rashes   Neuro: Alert and oriented to person, place, time, and situation, CNII-XII grossly intact, no focal deficits  Psych: Normal mood and affect   ASSESSMENT:   Erik Lolling Sr. is a 76 y.o. male who presents for the following: 1. Coronary artery disease involving native coronary artery of native heart with unstable angina pectoris ([place])   2. Mixed hyperlipidemia     PLAN:   1. Coronary artery disease involving native coronary artery of native heart with unstable angina pectoris ([place]) 2. Mixed hyperlipidemia -Coronary CTA with severe proximal LAD stenosis.  Still having symptoms of chest discomfort at night.  Had chest discomfort with induction of anesthesia for shoulder surgery.  He  has an ongoing need for procedures which will  include intestinal biopsy.  I believe he needs PCI as his stenosis is quite severe and in the proximal LAD.  He will proceed with his echocardiogram.  His history is complicated by gastritis and recurrent GI bleed.  Most recent EGD in March 2023 was with gastritis.  He had a admission to [facility] in 2021 with massive GI bleed.  He did receive several transfusions.  No bleeding source was identified.  We discussed rechallenging him on aspirin and I will reach out to his gastrointestinal doctor.  We will stop his famotidine and put him on Protonix.  Given his ongoing symptoms of chest discomfort he does merit PCI.  The proximal LAD is also a dangerous location which was discussed with him in the office today.  If he notices any bleeding in the next 2 weeks before his procedure we will have to delay as cardiac catheterization and percutaneous coronary intervention.  In the meantime we will start him on amlodipine 2.5 mg daily.  We will set him up for PCSK9 inhibitor at a later date.  He will proceed with his echo.  He has nitroglycerin and was given strict return precautions.  We will get a full panel of labs including CBC and BMP today.  I will be in touch after I discussed his case with his GI doctor.   Shared Decision Making/Informed Consent The risks [stroke (1 in 1000), death (1 in 1000), kidney failure [usually temporary] (1 in 500), bleeding (1 in 200), allergic reaction [possibly serious] (1 in 200)], benefits (diagnostic support and management of coronary artery disease) and alternatives of a cardiac catheterization were discussed in detail with [name] and he is willing to proceed.  Disposition: Return in about 1 month (around 06/18/2022).  Medication Adjustments/Labs and Tests Ordered: Current medicines are reviewed at length with the patient today.  Concerns regarding medicines are outlined above.  Orders Placed This Encounter  Procedures   Basic metabolic panel   CBC   EKG 12-Lead    Meds ordered this encounter  Medications   pantoprazole (PROTONIX) 40 MG tablet    Sig: Take 1 tablet (40 mg total) by mouth daily.    Dispense:  90 tablet    Refill:  3   aspirin EC 81 MG tablet    Sig: Take 1 tablet (81 mg total) by mouth daily. Swallow whole.    Dispense:  90 tablet    Refill:  3   amLODipine (NORVASC) 2.5 MG tablet    Sig: Take 1 tablet (2.5 mg total) by mouth daily.    Dispense:  90 tablet    Refill:  3   ezetimibe (ZETIA) 10 MG tablet    Sig: Take 1 tablet (10 mg total) by mouth daily.    Dispense:  90 tablet    Refill:  3    Patient Instructions  Medication Instructions:  STOP Famotidine   START Protonix 40 mg daily  STAT Zetia 10 mg daily  START Norvasc 2.5 mg daily   CONTINUE Aspirin 81 mg daily   *If you need a refill on your cardiac medications before your next appointment, please call your pharmacy*   Lab Work: CBC, BMET today   If you have labs (blood work) drawn today and your tests are completely normal, you will receive your results only by: [place] (if you have MyChart) OR A paper copy in the mail If you have any lab test  that is abnormal or we need to change your treatment, we will call you to review the results.   Testing/Procedures: Your physician has requested that you have a cardiac catheterization. Cardiac catheterization is used to diagnose and/or treat various heart conditions. Doctors may recommend this procedure for a number of different reasons. The most common reason is to evaluate chest pain. Chest pain can be a symptom of coronary artery disease (CAD), and cardiac catheterization can show whether plaque is narrowing or blocking your heart's arteries. This procedure is also used to evaluate the valves, as well as measure the blood flow and oxygen levels in different parts of your heart. For further information please visit [email or web address]. Please follow instruction sheet, as given.   Follow-Up: At [facility], you and your health needs are our priority.  As part of our continuing mission to provide you with exceptional heart care, we have created designated Provider Care Teams.  These Care Teams include your primary Cardiologist (physician) and Advanced Practice Providers (APPs -  Physician Assistants and Nurse Practitioners) who all work together to provide you with the care you need, when you need it.  We recommend signing up for the patient portal called "MyChart".  Sign up information is provided on this After Visit Summary.  MyChart is used to connect with patients for Virtual Visits (Telemedicine).  Patients are able to view lab/test results, encounter notes, upcoming appointments, etc.  Non-urgent messages can be sent to your provider as well.   To learn more about what you can do with MyChart, go to [email or web address].    Your next appointment:   1-2 weeks post CATH   The format for your next appointment:   In Person  Provider:   Eleonore Chiquito, MD   Other Instructions  [place] [address] Dept: [phone] Loc: [place].  05/18/2022  You are scheduled for a Cardiac Catheterization on July 18th ,  with Dr. Martinique .  1. Please arrive at the Main Entrance A at [facility]: [place], [place] [address] at 7:00 AM (This time is two hours before your procedure to ensure your preparation). Free valet parking service is available.   Special note: Every effort is made to have your procedure done on time. Please understand that emergencies sometimes delay scheduled procedures.  2. Diet: Do not eat solid foods after midnight.  You may have clear liquids until 5 AM upon the day of the procedure.  3. Labs: You will need to have blood drawn today- CBC, BMET. You do not need to be fasting.  4. Medication instructions in  preparation for your procedure:   Contrast Allergy: No  On the morning of your procedure, take Aspirin and any morning medicines NOT listed above.  You may use sips of water.  5. Plan to go home the same day, you will only stay overnight if medically necessary. 6. You MUST have a responsible adult to drive you home. 7. An adult MUST be with you the first 24 hours after you arrive home. 8. Bring a current list of your medications, and the last time and date medication taken. 9. Bring ID and current insurance cards. 10.Please wear clothes that are easy to get on and off and wear slip-on shoes.  Thank you for allowing [name] to care for you!   -- [facility]  Time Spent with Patient: I have spent a total of 35 minutes with patient reviewing hospital notes, telemetry, EKGs, labs and examining the patient as well as establishing an assessment and plan that was discussed with the patient.  > 50% of time was spent in direct patient care.  Signed, Addison Naegeli. Audie Box, MD, [place]  [address] [place], [place] [address] [phone]  05/18/2022 10:26 AM

## 2022-05-17 NOTE — H&P (View-Only) (Signed)
Cardiology Office Note:   Date:  05/18/2022  NAME:  Erik ARQUETTE Sr.    MRN: [id number] DOB:  [birth year]   PCP:  [name], MD  Cardiologist:  None  Electrophysiologist:  None   Referring MD: [name], MD   Chief Complaint  Patient presents with   Coronary Artery Disease   History of Present Illness:   Erik Lolling Sr. is a 76 y.o. male with a hx of CAD who presents for follow-up.  He reports he is getting sharp chest discomfort at night.  Reports tightness and shortness of breath.  Symptoms do not occur with exertion.  He had chest discomfort when undergoing shoulder surgery with anesthesia.  The procedure was stopped.  Coronary CTA shows severe proximal LAD stenosis.  He has severe reduction in blood flow based on FFR analysis.  The location is alarming.  I have recommended PCI.  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  He was admitted to [facility] in 2021 with massive GI bleed.  Recent EGD in March of this year shows gastritis.  No bleeding.  Hemoglobin was stable.  We will recheck labs.  We will set him up for PCI in 2 weeks.  I will reach out to Dr. Carlean Purl to determine if he has any recommendations.  He is statin intolerant.  Without exertional symptoms.  EKG shows nonspecific ST-T changes.  Echocardiogram is pending.  Problem List CAD -70-99% pLAD; CT FFR 0.57 2. HTN 3. HLD -Tchol 238, HDL 47, LDL 153, TG 185 -Statin intolerant 4.  Gastritis -Massive GI bleed [facility] in 2021 -EGD 01/2022 with gastritis  Past Medical History: Past Medical History:  Diagnosis Date   Allergic rhinitis    Allergy    Anal fissure    Anemia    Anxiety    Blood transfusion without reported diagnosis    Cataract    starting   Coronary atherosclerosis of native coronary artery 2007   nonobstructive CAD by cath with 40% mid-LAD stenosis   Degenerative arthritis of knee, bilateral 10/09/2017   Depression    Diverticulosis 12/18/2011   Diverticulosis  of colon with hemorrhage    April 2021, [facility]   Duodenal mass    Fibromyalgia    GERD with stricture    Helicobacter pylori (H. 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   Vitamin D deficiency 11/29/2017    Past Surgical History: Past Surgical History:  Procedure Laterality Date   bladder distention     x 6   CARDIAC CATHETERIZATION     CATHETER REMOVAL     super pubic area   COLONOSCOPY     DENTAL SURGERY Right    #30 tooth extraction (right upper)   INTERSTIM IMPLANT PLACEMENT     INTERSTIM IMPLANT REMOVAL     KNEE ARTHROSCOPY     right  x 2   NISSEN FUNDOPLICATION  8119   PROSTECTOMY     ROBOT ASSISTED LAPAROSCOPIC COMPLETE CYSTECT ILEAL CONDUIT     SHOULDER ARTHROSCOPY  2010   left   TONSILLECTOMY AND ADENOIDECTOMY  1957   TOTAL KNEE ARTHROPLASTY Right 09/08/2021   Procedure: TOTAL KNEE ARTHROPLASTY;  Surgeon: Susa Day, MD;  Location: [facility];  Service: Orthopedics;  Laterality: Right;   TRANSURETHRAL RESECTION OF PROSTATE     x 2   UPPER GASTROINTESTINAL ENDOSCOPY  05/13/2008   hiatal hernia   VASECTOMY      Current Medications: Current Meds  Medication Sig   amLODipine (NORVASC) 2.5 MG tablet Take 1 tablet (2.5 mg total) by mouth daily.   cholecalciferol (VITAMIN D3) 25 MCG (1000 UNIT) tablet Take 1,000 Units by mouth daily.   ezetimibe (ZETIA) 10 MG tablet Take 1 tablet (10 mg total) by mouth daily.   LORazepam (ATIVAN) 0.5 MG tablet  Take 0.5 tablets (0.25 mg total) by mouth 2 (two) times daily as needed for anxiety.   Polyethyl Glycol-Propyl Glycol (SYSTANE OP) Place 1 drop into both eyes 2 (two) times daily as needed (dry/irritated eyes).   traMADol (ULTRAM) 50 MG tablet TAKE 1 TABLET BY MOUTH TWICE A DAY AS NEEDED   [DISCONTINUED] famotidine (PEPCID) 20 MG tablet Take 20 mg by mouth daily as needed.     Allergies:    Ambien [zolpidem], Bentyl [dicyclomine], Carafate [sucralfate], Lansoprazole, Lexapro [escitalopram oxalate], Other, Pneumovax [pneumococcal polysaccharide vaccine], Prilosec [omeprazole], Remeron [mirtazapine], Seroquel [quetiapine], Statins, Tizanidine, Tramadol, and Tylenol [acetaminophen]   Social History: Social History   Socioeconomic History   Marital status: Married    Spouse name: Not on file   Number of children: 4   Years of education: Not on file   Highest education level: Not on file  Occupational History   Occupation: retired  Tobacco Use   Smoking status: Former   Smokeless tobacco: Never   Tobacco comments:    quit 1980  Vaping Use   Vaping Use: Never used  Substance and Sexual Activity   Alcohol use: No    Alcohol/week: 0.0 standard drinks of alcohol   Drug use: No   Sexual activity: Yes    Partners: Female  Other Topics Concern   Not on file  Social History Narrative   Married, retired for children   He is a former smoker no alcohol or substance use   Social Determinants of Radio broadcast assistant Strain: Low Risk  (12/08/2021)   Overall Financial Resource Strain (CARDIA)    Difficulty of Paying Living Expenses: Not hard at all  Food Insecurity: No Food Insecurity (12/08/2021)   Hunger Vital Sign    Worried About Running Out of Food in the Last Year: Never true    Ran Out of Food in the Last Year: Never true  Transportation Needs: No Transportation Needs (12/08/2021)   PRAPARE - Hydrologist (Medical): No    Lack of Transportation  (Non-Medical): No  Physical Activity: Insufficiently Active (12/08/2021)   Exercise Vital Sign    Days of Exercise per Week: 2 days    Minutes of Exercise per Session: 30 min  Stress: No Stress Concern Present (12/08/2021)   [place]    Feeling of Stress : Only a little  Social Connections: Moderately Integrated (12/08/2021)   Social Connection and Isolation Panel [NHANES]    Frequency of Communication with Friends and  Family: Three times a week    Frequency of Social Gatherings with Friends and Family: Three times a week    Attends Religious Services: More than 4 times per year    Active Member of Clubs or Organizations: No    Attends Archivist Meetings: Never    Marital Status: Married     Family History: The patient's family history includes Arthritis in his mother; Breast cancer in his sister; Colon polyps in his father; Diabetes in his father, paternal uncle, and sister; Heart disease in his father and mother; Hypertension in his father and mother; Liver cancer in his maternal grandfather and paternal uncle; Stroke in his mother. There is no history of Colon cancer, Esophageal cancer, Rectal cancer, Stomach cancer, or Pancreatic cancer.  ROS:   All other ROS reviewed and negative. Pertinent positives noted in the HPI.     EKGs/Labs/Other Studies Reviewed:   The following studies were personally reviewed by me today:  EKG:  EKG is ordered today.  The ekg ordered today demonstrates normal sinus rhythm heart rate 62, nonspecific ST-T changes, and was personally reviewed by me.   CCTA 05/16/2022 IMPRESSION: 1. Coronary calcium score of 675. This was 70th percentile for age-, sex, and race-matched controls. Plaque volume 549 mm3.   2. Normal coronary origin with right dominance.   3. Severe stenosis in the proximal LAD (70-99%).   4. Minimal CAD (<25%) in the LCX/RCA.  Recent Labs: 01/22/2022: TSH  3.25 04/16/2022: ALT 14; BUN 18; Creatinine, Ser 1.27; Hemoglobin 15.4; Platelets 291; Potassium 4.1; Sodium 138   Recent Lipid Panel    Component Value Date/Time   CHOL 238 (H) 01/22/2022 1049   TRIG 185.0 (H) 01/22/2022 1049   HDL 47.10 01/22/2022 1049   CHOLHDL 5 01/22/2022 1049   VLDL 37.0 01/22/2022 1049   LDLCALC 153 (H) 01/22/2022 1049   LDLDIRECT 169.0 06/24/2018 1408    Physical Exam:   VS:  BP 128/62 (BP Location: Left Arm, Patient Position: Sitting, Cuff Size: Large)   Pulse 62   Ht '5\' 6"'$  (1.676 m)   Wt 168 lb 6.4 oz (76.4 kg)   SpO2 96%   BMI 27.18 kg/m    Wt Readings from Last 3 Encounters:  05/18/22 168 lb 6.4 oz (76.4 kg)  04/20/22 164 lb (74.4 kg)  02/09/22 167 lb (75.8 kg)    General: Well nourished, well developed, in no acute distress Head: Atraumatic, normal size  Eyes: PEERLA, EOMI  Neck: Supple, no JVD Endocrine: No thryomegaly Cardiac: Normal S1, S2; RRR; no murmurs, rubs, or gallops Lungs: Clear to auscultation bilaterally, no wheezing, rhonchi or rales  Abd: Soft, nontender, no hepatomegaly  Ext: No edema, pulses 2+ Musculoskeletal: No deformities, BUE and BLE strength normal and equal Skin: Warm and dry, no rashes   Neuro: Alert and oriented to person, place, time, and situation, CNII-XII grossly intact, no focal deficits  Psych: Normal mood and affect   ASSESSMENT:   Erik Lolling Sr. is a 76 y.o. male who presents for the following: 1. Coronary artery disease involving native coronary artery of native heart with unstable angina pectoris ([place])   2. Mixed hyperlipidemia     PLAN:   1. Coronary artery disease involving native coronary artery of native heart with unstable angina pectoris ([place]) 2. Mixed hyperlipidemia -Coronary CTA with severe proximal LAD stenosis.  Still having symptoms of chest discomfort at night.  Had chest discomfort with induction of anesthesia for shoulder surgery.  He  has an ongoing need for procedures which will  include intestinal biopsy.  I believe he needs PCI as his stenosis is quite severe and in the proximal LAD.  He will proceed with his echocardiogram.  His history is complicated by gastritis and recurrent GI bleed.  Most recent EGD in March 2023 was with gastritis.  He had a admission to [facility] in 2021 with massive GI bleed.  He did receive several transfusions.  No bleeding source was identified.  We discussed rechallenging him on aspirin and I will reach out to his gastrointestinal doctor.  We will stop his famotidine and put him on Protonix.  Given his ongoing symptoms of chest discomfort he does merit PCI.  The proximal LAD is also a dangerous location which was discussed with him in the office today.  If he notices any bleeding in the next 2 weeks before his procedure we will have to delay as cardiac catheterization and percutaneous coronary intervention.  In the meantime we will start him on amlodipine 2.5 mg daily.  We will set him up for PCSK9 inhibitor at a later date.  He will proceed with his echo.  He has nitroglycerin and was given strict return precautions.  We will get a full panel of labs including CBC and BMP today.  I will be in touch after I discussed his case with his GI doctor.   Shared Decision Making/Informed Consent The risks [stroke (1 in 1000), death (1 in 1000), kidney failure [usually temporary] (1 in 500), bleeding (1 in 200), allergic reaction [possibly serious] (1 in 200)], benefits (diagnostic support and management of coronary artery disease) and alternatives of a cardiac catheterization were discussed in detail with Mr. Muldrew and he is willing to proceed.  Disposition: Return in about 1 month (around 06/18/2022).  Medication Adjustments/Labs and Tests Ordered: Current medicines are reviewed at length with the patient today.  Concerns regarding medicines are outlined above.  Orders Placed This Encounter  Procedures   Basic metabolic panel   CBC   EKG 12-Lead    Meds ordered this encounter  Medications   pantoprazole (PROTONIX) 40 MG tablet    Sig: Take 1 tablet (40 mg total) by mouth daily.    Dispense:  90 tablet    Refill:  3   aspirin EC 81 MG tablet    Sig: Take 1 tablet (81 mg total) by mouth daily. Swallow whole.    Dispense:  90 tablet    Refill:  3   amLODipine (NORVASC) 2.5 MG tablet    Sig: Take 1 tablet (2.5 mg total) by mouth daily.    Dispense:  90 tablet    Refill:  3   ezetimibe (ZETIA) 10 MG tablet    Sig: Take 1 tablet (10 mg total) by mouth daily.    Dispense:  90 tablet    Refill:  3    Patient Instructions  Medication Instructions:  STOP Famotidine   START Protonix 40 mg daily  STAT Zetia 10 mg daily  START Norvasc 2.5 mg daily   CONTINUE Aspirin 81 mg daily   *If you need a refill on your cardiac medications before your next appointment, please call your pharmacy*   Lab Work: CBC, BMET today   If you have labs (blood work) drawn today and your tests are completely normal, you will receive your results only by: [place] (if you have MyChart) OR A paper copy in the mail If you have any lab test  that is abnormal or we need to change your treatment, we will call you to review the results.   Testing/Procedures: Your physician has requested that you have a cardiac catheterization. Cardiac catheterization is used to diagnose and/or treat various heart conditions. Doctors may recommend this procedure for a number of different reasons. The most common reason is to evaluate chest pain. Chest pain can be a symptom of coronary artery disease (CAD), and cardiac catheterization can show whether plaque is narrowing or blocking your heart's arteries. This procedure is also used to evaluate the valves, as well as measure the blood flow and oxygen levels in different parts of your heart. For further information please visit [email or web address].[email or web address]. Please follow instruction sheet, as given.   Follow-Up: At [facility], you and your health needs are our priority.  As part of our continuing mission to provide you with exceptional heart care, we have created designated Provider Care Teams.  These Care Teams include your primary Cardiologist (physician) and Advanced Practice Providers (APPs -  Physician Assistants and Nurse Practitioners) who all work together to provide you with the care you need, when you need it.  We recommend signing up for the patient portal called "MyChart".  Sign up information is provided on this After Visit Summary.  MyChart is used to connect with patients for Virtual Visits (Telemedicine).  Patients are able to view lab/test results, encounter notes, upcoming appointments, etc.  Non-urgent messages can be sent to your provider as well.   To learn more about what you can do with MyChart, go to [email or web address].    Your next appointment:   1-2 weeks post CATH   The format for your next appointment:   In Person  Provider:   Eleonore Chiquito, MD   Other Instructions  [place] [address] Dept: [phone] Loc: [place].  05/18/2022  You are scheduled for a Cardiac Catheterization on July 18th ,  with Dr. Martinique .  1. Please arrive at the Main Entrance A at [facility]: [place], [place] [address] at 7:00 AM (This time is two hours before your procedure to ensure your preparation). Free valet parking service is available.   Special note: Every effort is made to have your procedure done on time. Please understand that emergencies sometimes delay scheduled procedures.  2. Diet: Do not eat solid foods after midnight.  You may have clear liquids until 5 AM upon the day of the procedure.  3. Labs: You will need to have blood drawn today- CBC, BMET. You do not need to be fasting.  4. Medication instructions in  preparation for your procedure:   Contrast Allergy: No  On the morning of your procedure, take Aspirin and any morning medicines NOT listed above.  You may use sips of water.  5. Plan to go home the same day, you will only stay overnight if medically necessary. 6. You MUST have a responsible adult to drive you home. 7. An adult MUST be with you the first 24 hours after you arrive home. 8. Bring a current list of your medications, and the last time and date medication taken. 9. Bring ID and current insurance cards. 10.Please wear clothes that are easy to get on and off and wear slip-on shoes.  Thank you for allowing [name] to care for you!   -- [facility] [facility]  Time Spent with Patient: I have spent a total of 35 minutes with patient reviewing hospital notes, telemetry, EKGs, labs and examining the patient as well as establishing an assessment and plan that was discussed with the patient.  > 50% of time was spent in direct patient care.  Signed, Addison Naegeli. Audie Box, MD, [place]  [address], [place], [place] [address] [phone]  05/18/2022 10:26 AM

## 2022-05-18 ENCOUNTER — Encounter: Payer: Self-pay | Admitting: Cardiovascular Disease

## 2022-05-18 ENCOUNTER — Ambulatory Visit: Payer: Medicare Other | Admitting: Cardiovascular Disease

## 2022-05-18 VITALS — BP 128/62 | HR 62 | Ht 66.0 in | Wt 168.4 lb

## 2022-05-18 DIAGNOSIS — I25110 Atherosclerotic heart disease of native coronary artery with unstable angina pectoris: Secondary | ICD-10-CM

## 2022-05-18 DIAGNOSIS — E782 Mixed hyperlipidemia: Secondary | ICD-10-CM

## 2022-05-18 DIAGNOSIS — I2510 Atherosclerotic heart disease of native coronary artery without angina pectoris: Secondary | ICD-10-CM

## 2022-05-18 MED ORDER — PANTOPRAZOLE SODIUM 40 MG PO TBEC: 40.0000 mg | DELAYED_RELEASE_TABLET | Freq: Every day | ORAL | 3 refills | Status: DC

## 2022-05-18 MED ORDER — EZETIMIBE 10 MG PO TABS: 10.0000 mg | ORAL_TABLET | Freq: Every day | ORAL | 3 refills | Status: DC

## 2022-05-18 MED ORDER — ASPIRIN 81 MG PO TBEC: 81.0000 mg | DELAYED_RELEASE_TABLET | Freq: Every day | ORAL | 3 refills | Status: DC

## 2022-05-18 MED ORDER — AMLODIPINE BESYLATE 2.5 MG PO TABS: 2.5000 mg | ORAL_TABLET | Freq: Every day | ORAL | 3 refills | Status: DC

## 2022-05-18 NOTE — Patient Instructions (Addendum)
Medication Instructions:  STOP Famotidine   START Protonix 40 mg daily  STAT Zetia 10 mg daily  START Norvasc 2.5 mg daily   CONTINUE Aspirin 81 mg daily   *If you need a refill on your cardiac medications before your next appointment, please call your pharmacy*   Lab Work: CBC, BMET today   If you have labs (blood work) drawn today and your tests are completely normal, you will receive your results only by: [place] (if you have MyChart) OR A paper copy in the mail If you have any lab test that is abnormal or we need to change your treatment, we will call you to review the results.   Testing/Procedures: Your physician has requested that you have a cardiac catheterization. Cardiac catheterization is used to diagnose and/or treat various heart conditions. Doctors may recommend this procedure for a number of different reasons. The most common reason is to evaluate chest pain. Chest pain can be a symptom of coronary artery disease (CAD), and cardiac catheterization can show whether plaque is narrowing or blocking your heart's arteries. This procedure is also used to evaluate the valves, as well as measure the blood flow and oxygen levels in different parts of your heart. For further information please visit [email or web address]. Please follow instruction sheet, as given.   Follow-Up: At [facility], you and your health needs are our priority.  As part of our continuing mission to provide you with exceptional heart care, we have created designated Provider Care Teams.  These Care Teams include your primary Cardiologist (physician) and Advanced Practice Providers (APPs -  Physician Assistants and Nurse Practitioners) who all work together to provide you with the care you need, when you need it.  We recommend signing up for the patient portal called "MyChart".  Sign up information is provided on this After Visit Summary.  MyChart is used to connect with patients for Virtual Visits  (Telemedicine).  Patients are able to view lab/test results, encounter notes, upcoming appointments, etc.  Non-urgent messages can be sent to your provider as well.   To learn more about what you can do with MyChart, go to [email or web address].    Your next appointment:   1-2 weeks post CATH   The format for your next appointment:   In Person  Provider:   Eleonore Chiquito, MD   Other Instructions  [place] [address] Dept: [phone] Loc: [place].  05/18/2022  You are scheduled for a Cardiac Catheterization on July 18th ,  with Dr. Martinique .  1. Please arrive at the Main Entrance A at [facility]: [place], [place] [address] at 7:00 AM (This time is two hours before your procedure to ensure your preparation). Free valet parking service is available.   Special note: Every effort is made to have your procedure done on time. Please understand that emergencies sometimes delay scheduled procedures.  2. Diet: Do not eat solid foods after midnight.  You may have clear liquids until 5 AM upon the day of the procedure.  3. Labs: You will need to have blood drawn today- CBC, BMET. You do not need to be fasting.  4. Medication instructions in preparation for your procedure:   Contrast Allergy: No  On the morning of your procedure, take Aspirin and any morning medicines NOT listed above.  You may use sips of water.  5. Plan  to go home the same day, you will only stay overnight if medically necessary. 6. You MUST have a responsible adult to drive you home. 7. An adult MUST be with you the first 24 hours after you arrive home. 8. Bring a current list of your medications, and the last time and date medication taken. 9. Bring ID and current insurance cards. 10.Please wear clothes that are easy to get on and off and wear slip-on  shoes.  Thank you for allowing [name] to care for you!   -- [place] [facility]

## 2022-05-19 LAB — CBC
Hematocrit: 43.2 % (ref 37.5–51.0)
Hemoglobin: 14.5 g/dL (ref 13.0–17.7)
MCH: 29.7 pg (ref 26.6–33.0)
MCHC: 33.6 g/dL (ref 31.5–35.7)
MCV: 89 fL (ref 79–97)
Platelets: 326 10*3/uL (ref 150–450)
RBC: 4.88 x10E6/uL (ref 4.14–5.80)
RDW: 12.7 % (ref 11.6–15.4)
WBC: 7.6 10*3/uL (ref 3.4–10.8)

## 2022-05-19 LAB — BASIC METABOLIC PANEL
BUN/Creatinine Ratio: 16 (ref 10–24)
BUN: 22 mg/dL (ref 8–27)
CO2: 24 mmol/L (ref 20–29)
Calcium: 9.7 mg/dL (ref 8.6–10.2)
Chloride: 103 mmol/L (ref 96–106)
Creatinine, Ser: 1.41 mg/dL — ABNORMAL HIGH (ref 0.76–1.27)
Glucose: 68 mg/dL — ABNORMAL LOW (ref 70–99)
Potassium: 5.2 mmol/L (ref 3.5–5.2)
Sodium: 139 mmol/L (ref 134–144)
eGFR: 52 mL/min/{1.73_m2} — ABNORMAL LOW (ref >59)

## 2022-05-21 ENCOUNTER — Ambulatory Visit (HOSPITAL_COMMUNITY)
Admission: RE | Admit: 2022-05-21 | Discharge: 2022-05-21 | Disposition: A | Discharge status: Home / Self Care | Payer: Medicare Other | Source: Ambulatory Visit | Attending: Cardiovascular Disease | Admitting: Cardiovascular Disease

## 2022-05-21 DIAGNOSIS — R072 Precordial pain: Secondary | ICD-10-CM

## 2022-05-21 DIAGNOSIS — I10 Essential (primary) hypertension: Secondary | ICD-10-CM | POA: Diagnosis not present

## 2022-05-21 DIAGNOSIS — E785 Hyperlipidemia, unspecified: Secondary | ICD-10-CM | POA: Diagnosis not present

## 2022-05-21 DIAGNOSIS — I351 Nonrheumatic aortic (valve) insufficiency: Secondary | ICD-10-CM | POA: Diagnosis not present

## 2022-05-21 LAB — ECHOCARDIOGRAM COMPLETE
Area-P 1/2: 3.72 cm2
Calc EF: 63.8 %
S' Lateral: 2.2 cm
Single Plane A2C EF: 65.9 %
Single Plane A4C EF: 61.8 %

## 2022-05-25 ENCOUNTER — Encounter: Payer: Self-pay | Admitting: Cardiovascular Disease

## 2022-05-27 ENCOUNTER — Encounter: Payer: Self-pay | Admitting: Cardiovascular Disease

## 2022-05-28 ENCOUNTER — Telehealth: Payer: Self-pay | Admitting: *Deleted

## 2022-05-28 DIAGNOSIS — Z932 Ileostomy status: Secondary | ICD-10-CM | POA: Diagnosis not present

## 2022-05-28 NOTE — Telephone Encounter (Signed)
Cardiac Catheterization scheduled at [facility] for: Tuesday May 29, 2022 9 AM Arrival time and place: [facility] Main Entrance A at: 7 AM   Nothing to eat after midnight prior to procedure, clear liquids until 5 AM day of procedure.  Medication instructions: -Usual morning medications can be taken with sips of water including aspirin 81 mg.  Confirmed patient has responsible adult to drive home post procedure and be with patient first 24 hours after arriving home.  Patient reports no new symptoms concerning for COVID-19 in the past 10 days.  Reviewed procedure instructions with patient.

## 2022-05-29 ENCOUNTER — Ambulatory Visit (HOSPITAL_COMMUNITY)
Admission: RE | Admit: 2022-05-29 | Discharge: 2022-05-29 | Disposition: A | Discharge status: Home / Self Care | Payer: Medicare Other | Attending: Cardiology | Admitting: Cardiology

## 2022-05-29 ENCOUNTER — Other Ambulatory Visit (HOSPITAL_COMMUNITY): Payer: Self-pay

## 2022-05-29 ENCOUNTER — Other Ambulatory Visit: Payer: Self-pay

## 2022-05-29 ENCOUNTER — Ambulatory Visit (HOSPITAL_COMMUNITY)
Admission: RE | Disposition: A | Discharge status: Home / Self Care | Payer: Self-pay | Source: Home / Self Care | Attending: Cardiology

## 2022-05-29 DIAGNOSIS — I25110 Atherosclerotic heart disease of native coronary artery with unstable angina pectoris: Secondary | ICD-10-CM | POA: Diagnosis not present

## 2022-05-29 DIAGNOSIS — Z955 Presence of coronary angioplasty implant and graft: Secondary | ICD-10-CM | POA: Insufficient documentation

## 2022-05-29 DIAGNOSIS — E782 Mixed hyperlipidemia: Secondary | ICD-10-CM | POA: Diagnosis not present

## 2022-05-29 DIAGNOSIS — E785 Hyperlipidemia, unspecified: Secondary | ICD-10-CM | POA: Diagnosis present

## 2022-05-29 DIAGNOSIS — I25118 Atherosclerotic heart disease of native coronary artery with other forms of angina pectoris: Secondary | ICD-10-CM

## 2022-05-29 DIAGNOSIS — I2584 Coronary atherosclerosis due to calcified coronary lesion: Secondary | ICD-10-CM | POA: Diagnosis not present

## 2022-05-29 DIAGNOSIS — Z87891 Personal history of nicotine dependence: Secondary | ICD-10-CM | POA: Insufficient documentation

## 2022-05-29 DIAGNOSIS — I10 Essential (primary) hypertension: Secondary | ICD-10-CM | POA: Insufficient documentation

## 2022-05-29 DIAGNOSIS — I2510 Atherosclerotic heart disease of native coronary artery without angina pectoris: Secondary | ICD-10-CM | POA: Diagnosis present

## 2022-05-29 HISTORY — PX: LEFT HEART CATH AND CORONARY ANGIOGRAPHY: CATH118249

## 2022-05-29 HISTORY — PX: CORONARY STENT INTERVENTION: CATH118234

## 2022-05-29 LAB — GLUCOSE, CAPILLARY: Glucose-Capillary: 122 mg/dL — ABNORMAL HIGH (ref 70–99)

## 2022-05-29 LAB — POCT ACTIVATED CLOTTING TIME
Activated Clotting Time: 269 seconds
Activated Clotting Time: 269 seconds

## 2022-05-29 SURGERY — LEFT HEART CATH AND CORONARY ANGIOGRAPHY
Anesthesia: LOCAL

## 2022-05-29 MED ORDER — CLOPIDOGREL BISULFATE 300 MG PO TABS
ORAL_TABLET | ORAL | Status: AC
Start: 2022-05-29 — End: ?
  Filled 2022-05-29: qty 1

## 2022-05-29 MED ORDER — HEPARIN SODIUM (PORCINE) 1000 UNIT/ML IJ SOLN
INTRAMUSCULAR | Status: DC | PRN
  Administered 2022-05-29: 2000 [IU] via INTRAVENOUS
  Administered 2022-05-29 (×2): 4000 [IU] via INTRAVENOUS

## 2022-05-29 MED ORDER — TICAGRELOR 90 MG PO TABS: ORAL_TABLET | ORAL | Status: DC | PRN

## 2022-05-29 MED ORDER — SODIUM CHLORIDE 0.9% FLUSH: 3.0000 mL | INTRAVENOUS | Status: DC | PRN

## 2022-05-29 MED ORDER — ASPIRIN 81 MG PO CHEW: 81.0000 mg | CHEWABLE_TABLET | ORAL | Status: DC

## 2022-05-29 MED ORDER — SODIUM CHLORIDE 0.9 % WEIGHT BASED INFUSION
3.0000 mL/kg/h | INTRAVENOUS | Status: AC
  Administered 2022-05-29: 3 mL/kg/h via INTRAVENOUS

## 2022-05-29 MED ORDER — VERAPAMIL HCL 2.5 MG/ML IV SOLN
INTRAVENOUS | Status: DC | PRN
  Administered 2022-05-29: 10 mL via INTRA_ARTERIAL

## 2022-05-29 MED ORDER — EZETIMIBE 10 MG PO TABS: 10.0000 mg | ORAL_TABLET | Freq: Every day | ORAL | Status: DC

## 2022-05-29 MED ORDER — ASPIRIN 81 MG PO TBEC: 81.0000 mg | DELAYED_RELEASE_TABLET | Freq: Every day | ORAL | Status: DC

## 2022-05-29 MED ORDER — PANTOPRAZOLE SODIUM 40 MG PO TBEC
40.0000 mg | DELAYED_RELEASE_TABLET | Freq: Every day | ORAL | Status: DC
Start: 2022-05-29 — End: 2022-05-29

## 2022-05-29 MED ORDER — LIDOCAINE HCL (PF) 1 % IJ SOLN
INTRAMUSCULAR | Status: DC | PRN
  Administered 2022-05-29: 2 mL

## 2022-05-29 MED ORDER — CLOPIDOGREL BISULFATE 75 MG PO TABS: 75.0000 mg | ORAL_TABLET | Freq: Every day | ORAL | Status: DC

## 2022-05-29 MED ORDER — SODIUM CHLORIDE 0.9 % IV SOLN: 250.0000 mL | INTRAVENOUS | Status: DC | PRN

## 2022-05-29 MED ORDER — CLOPIDOGREL BISULFATE 300 MG PO TABS
ORAL_TABLET | ORAL | Status: DC | PRN
  Administered 2022-05-29: 600 mg via ORAL

## 2022-05-29 MED ORDER — MIDAZOLAM HCL 2 MG/2ML IJ SOLN
INTRAMUSCULAR | Status: AC
  Filled 2022-05-29: qty 2

## 2022-05-29 MED ORDER — FENTANYL CITRATE (PF) 100 MCG/2ML IJ SOLN
INTRAMUSCULAR | Status: DC | PRN
  Administered 2022-05-29: 25 ug via INTRAVENOUS

## 2022-05-29 MED ORDER — VERAPAMIL HCL 2.5 MG/ML IV SOLN
INTRAVENOUS | Status: AC
  Filled 2022-05-29: qty 2

## 2022-05-29 MED ORDER — MIDAZOLAM HCL 2 MG/2ML IJ SOLN
INTRAMUSCULAR | Status: DC | PRN
  Administered 2022-05-29: 2 mg via INTRAVENOUS

## 2022-05-29 MED ORDER — IOHEXOL 350 MG/ML SOLN
INTRAVENOUS | Status: DC | PRN
  Administered 2022-05-29: 125 mL

## 2022-05-29 MED ORDER — FAMOTIDINE IN NACL 20-0.9 MG/50ML-% IV SOLN
INTRAVENOUS | Status: AC
  Filled 2022-05-29: qty 50

## 2022-05-29 MED ORDER — NITROGLYCERIN 1 MG/10 ML FOR IR/CATH LAB
INTRA_ARTERIAL | Status: AC
  Filled 2022-05-29: qty 10

## 2022-05-29 MED ORDER — CLOPIDOGREL BISULFATE 75 MG PO TABS
75.0000 mg | ORAL_TABLET | Freq: Every day | ORAL | 1 refills | Status: DC
  Filled 2022-05-29: qty 90, 90d supply, fill #0

## 2022-05-29 MED ORDER — FAMOTIDINE IN NACL 20-0.9 MG/50ML-% IV SOLN
INTRAVENOUS | Status: DC | PRN
  Administered 2022-05-29: 20 mg via INTRAVENOUS

## 2022-05-29 MED ORDER — LIDOCAINE HCL (PF) 1 % IJ SOLN
INTRAMUSCULAR | Status: AC
  Filled 2022-05-29: qty 30

## 2022-05-29 MED ORDER — HEPARIN (PORCINE) IN NACL 1000-0.9 UT/500ML-% IV SOLN
INTRAVENOUS | Status: DC | PRN
  Administered 2022-05-29 (×2): 500 mL

## 2022-05-29 MED ORDER — SODIUM CHLORIDE 0.9 % WEIGHT BASED INFUSION: 1.0000 mL/kg/h | INTRAVENOUS | Status: DC

## 2022-05-29 MED ORDER — ACETAMINOPHEN 325 MG PO TABS: 650.0000 mg | ORAL_TABLET | ORAL | Status: DC | PRN

## 2022-05-29 MED ORDER — HEPARIN SODIUM (PORCINE) 1000 UNIT/ML IJ SOLN
INTRAMUSCULAR | Status: AC
  Filled 2022-05-29: qty 10

## 2022-05-29 MED ORDER — SODIUM CHLORIDE 0.9% FLUSH: 3.0000 mL | Freq: Two times a day (BID) | INTRAVENOUS | Status: DC

## 2022-05-29 MED ORDER — CHOLECALCIFEROL 125 MCG (5000 UT) PO CAPS: 5000.0000 [IU] | ORAL_CAPSULE | Freq: Every day | ORAL | Status: DC

## 2022-05-29 MED ORDER — AMLODIPINE BESYLATE 5 MG PO TABS: 2.5000 mg | ORAL_TABLET | Freq: Every day | ORAL | Status: DC

## 2022-05-29 MED ORDER — ONDANSETRON HCL 4 MG/2ML IJ SOLN: 4.0000 mg | Freq: Four times a day (QID) | INTRAMUSCULAR | Status: DC | PRN

## 2022-05-29 MED ORDER — HEPARIN (PORCINE) IN NACL 1000-0.9 UT/500ML-% IV SOLN
INTRAVENOUS | Status: AC
  Filled 2022-05-29: qty 1000

## 2022-05-29 MED ORDER — FENTANYL CITRATE (PF) 100 MCG/2ML IJ SOLN
INTRAMUSCULAR | Status: AC
  Filled 2022-05-29: qty 2

## 2022-05-29 SURGICAL SUPPLY — 22 items
BALL SAPPHIRE NC24 3.5X12 (BALLOONS) ×2
BALLN SAPPHIRE 2.5X12 (BALLOONS) ×2
BALLOON SAPPHIRE 2.5X12 (BALLOONS) IMPLANT
BALLOON SAPPHIRE NC24 3.5X12 (BALLOONS) IMPLANT
BAND CMPR LRG ZPHR (HEMOSTASIS) ×1
BAND ZEPHYR COMPRESS 30 LONG (HEMOSTASIS) ×1 IMPLANT
CATH 5FR JL3.5 JR4 ANG PIG MP (CATHETERS) ×1 IMPLANT
CATH DRAGONFLY OPSTAR (CATHETERS) ×1 IMPLANT
CATH VISTA GUIDE 6FR XBLAD3.5 (CATHETERS) ×1 IMPLANT
GLIDESHEATH SLEND SS 6F .021 (SHEATH) ×1 IMPLANT
GUIDEWIRE INQWIRE 1.5J.035X260 (WIRE) IMPLANT
INQWIRE 1.5J .035X260CM (WIRE) ×2
KIT ENCORE 26 ADVANTAGE (KITS) ×1 IMPLANT
KIT HEART LEFT (KITS) ×2 IMPLANT
PACK CARDIAC CATHETERIZATION (CUSTOM PROCEDURE TRAY) ×2 IMPLANT
STENT SYNERGY XD 3.0X20 (Permanent Stent) IMPLANT
SYNERGY XD 3.0X20 (Permanent Stent) ×2 IMPLANT
SYR MEDRAD MARK 7 150ML (SYRINGE) ×2 IMPLANT
TRANSDUCER W/STOPCOCK (MISCELLANEOUS) ×2 IMPLANT
TUBING CIL FLEX 10 FLL-RA (TUBING) ×2 IMPLANT
WIRE ASAHI PROWATER 180CM (WIRE) ×1 IMPLANT
WIRE COUGAR XT STRL 190CM (WIRE) ×1 IMPLANT

## 2022-05-29 NOTE — Discharge Summary (Signed)
Discharge Summary for Same Day PCI   Patient ID: Erik Lolling Sr. MRN: [id number]; DOB: 1946/07/27  Admit date: 05/29/2022 Discharge date: 05/29/2022  Primary Care Provider: Maggie Schwalbe, PA-C  Primary Cardiologist: Evalina Field, MD  Primary Electrophysiologist:  None   Discharge Diagnoses    Principal Problem:   CAD (coronary artery disease) Active Problems:   Hyperlipidemia  Diagnostic Studies/Procedures    Cardiac Catheterization 05/29/2022:    Ost RCA lesion is 50% stenosed.   Prox LAD to Mid LAD lesion is 70% stenosed.   Prox LAD lesion is 90% stenosed.   Mid Cx lesion is 50% stenosed.   A drug-eluting stent was successfully placed using a SYNERGY XD 3.0X20.   Post intervention, there is a 0% residual stenosis.   Post intervention, there is a 0% residual stenosis.   LV end diastolic pressure is normal.   Single vessel obstructive CAD Normal LVEDP Successful PCI of the proximal to mid LAD with OCT guidance and DES x 1   Plan: DAPT for 6 months. Anticipate same day DC.  Diagnostic Dominance: Right  Intervention     _____________   History of Present Illness     Erik DELDUCA Sr. is a 76 y.o. male with past medical history of CAD, hypertension, hyperlipidemia who was recently seen in the office by Dr. Davina Poke after a referral from the ER for chest pain.  He was at the surgical center recently undergoing shoulder surgery.  After given anesthesia while breathing in the mask he developed chest pain which was a dull sensation.  His symptoms resolved with aspirin as well as nitroglycerin and he was sent to the ED to rule out ACS.  His EKG showed possible inferior infarct.  He did have a cardiac catheterization in 2007 that showed 50% mid LAD lesion.  Stated he had been off statin therapy as he was unable to tolerate.  Given his symptoms he was started on aspirin 81 mg daily.  Set up for outpatient coronary CTA.  Coronary CT showed severe proximal LAD stenosis.   He was started on amlodipine 2.5 mg daily and referred to the lipid clinic for PCSK9 inhibitor.  Also set up for echocardiogram. Cardiac catheterization was arranged for further evaluation.  Hospital Course     The patient underwent cardiac cath as noted above with severe single-vessel CAD with 90% proximal mid LAD stenosis treated with PCI/OTC guidance and DES x1. Plan for DAPT with ASA/Plavix for at least 6 months. The patient was seen by cardiac rehab while in short stay. There were no observed complications post cath. Radial cath site was re-evaluated prior to discharge and found to be stable without any complications. Instructions/precautions regarding cath site care were given prior to discharge.  Erik Lolling Sr. was seen by Dr. Martinique and determined stable for discharge home. Follow up with our office has been arranged. Medications are listed below. Pertinent changes include plavix.  _____________  Cath/PCI Registry Performance & Quality Measures: Aspirin prescribed? - Yes ADP Receptor Inhibitor (Plavix/Clopidogrel, Brilinta/Ticagrelor or Effient/Prasugrel) prescribed (includes medically managed patients)? - Yes High Intensity Statin (Lipitor 40-'80mg'$  or Crestor 20-'40mg'$ ) prescribed? - No - intolerant to statins For EF <40%, was ACEI/ARB prescribed? - Not Applicable (EF >/= 60%) For EF <40%, Aldosterone Antagonist (Spironolactone or Eplerenone) prescribed? - Not Applicable (EF >/= 73%) Cardiac Rehab Phase II ordered (Included Medically managed Patients)? - Yes  _____________   Discharge Vitals Blood pressure 113/65, pulse 77, temperature 97.7 F (36.5  C), resp. rate 18, height '5\' 7"'$  (1.702 m), weight 72.6 kg, SpO2 95 %.  Filed Weights   05/29/22 0712  Weight: 72.6 kg    Last Labs & Radiologic Studies    CBC No results for input(s): "WBC", "NEUTROABS", "HGB", "HCT", "MCV", "PLT" in the last 72 hours. Basic Metabolic Panel No results for input(s): "NA", "K", "CL", "CO2",  "GLUCOSE", "BUN", "CREATININE", "CALCIUM", "MG", "PHOS" in the last 72 hours. Liver Function Tests No results for input(s): "AST", "ALT", "ALKPHOS", "BILITOT", "PROT", "ALBUMIN" in the last 72 hours. No results for input(s): "LIPASE", "AMYLASE" in the last 72 hours. High Sensitivity Troponin:   No results for input(s): "TROPONINIHS" in the last 720 hours.  BNP Invalid input(s): "POCBNP" D-Dimer No results for input(s): "DDIMER" in the last 72 hours. Hemoglobin A1C No results for input(s): "HGBA1C" in the last 72 hours. Fasting Lipid Panel No results for input(s): "CHOL", "HDL", "LDLCALC", "TRIG", "CHOLHDL", "LDLDIRECT" in the last 72 hours. Thyroid Function Tests No results for input(s): "TSH", "T4TOTAL", "T3FREE", "THYROIDAB" in the last 72 hours.  Invalid input(s): "FREET3" _____________  CARDIAC CATHETERIZATION  Result Date: 05/29/2022   Ost RCA lesion is 50% stenosed.   Prox LAD to Mid LAD lesion is 70% stenosed.   Prox LAD lesion is 90% stenosed.   Mid Cx lesion is 50% stenosed.   A drug-eluting stent was successfully placed using a SYNERGY XD 3.0X20.   Post intervention, there is a 0% residual stenosis.   Post intervention, there is a 0% residual stenosis.   LV end diastolic pressure is normal. Single vessel obstructive CAD Normal LVEDP Successful PCI of the proximal to mid LAD with OCT guidance and DES x 1 Plan: DAPT for 6 months. Anticipate same day DC.   ECHOCARDIOGRAM COMPLETE  Result Date: 05/21/2022    ECHOCARDIOGRAM REPORT   Patient Name:   Erik HOEHN Sr. Date of Exam: 05/21/2022 Medical Rec #:  [id number]          Height:       66.0 in Accession #:    [id number]         Weight:       168.4 lb Date of Birth:  1946/05/28           BSA:          1.859 m Patient Age:    52 years           BP:           128/62 mmHg Patient Gender: M                  HR:           79 bpm. Exam Location:  Outpatient Procedure: 2D Echo, Cardiac Doppler and Color Doppler Indications:    Chest Pain  R07.2  History:        Patient has no prior history of Echocardiogram examinations.                 Risk Factors:Hypertension and Dyslipidemia.  Sonographer:    Mikki Santee RDCS Referring Phys: [id number] [place]  1. Left ventricular ejection fraction, by estimation, is 60 to 65%. The left ventricle has normal function. The left ventricle has no regional wall motion abnormalities. Left ventricular diastolic parameters are consistent with Grade I diastolic dysfunction (impaired relaxation).  2. Right ventricular systolic function is normal. The right ventricular size is normal. There is normal pulmonary artery systolic pressure. The estimated right ventricular  systolic pressure is 84.6 mmHg.  3. The mitral valve is grossly normal. Trivial mitral valve regurgitation. No evidence of mitral stenosis.  4. The aortic valve is tricuspid. Aortic valve regurgitation is mild. No aortic stenosis is present.  5. The inferior vena cava is dilated in size with >50% respiratory variability, suggesting right atrial pressure of 8 mmHg. FINDINGS  Left Ventricle: Left ventricular ejection fraction, by estimation, is 60 to 65%. The left ventricle has normal function. The left ventricle has no regional wall motion abnormalities. The left ventricular internal cavity size was normal in size. There is  no left ventricular hypertrophy. Left ventricular diastolic parameters are consistent with Grade I diastolic dysfunction (impaired relaxation). Right Ventricle: The right ventricular size is normal. No increase in right ventricular wall thickness. Right ventricular systolic function is normal. There is normal pulmonary artery systolic pressure. The tricuspid regurgitant velocity is 2.20 m/s, and  with an assumed right atrial pressure of 8 mmHg, the estimated right ventricular systolic pressure is 96.2 mmHg. Left Atrium: Left atrial size was normal in size. Right Atrium: Right atrial size was normal in size.  Pericardium: There is no evidence of pericardial effusion. Mitral Valve: The mitral valve is grossly normal. Trivial mitral valve regurgitation. No evidence of mitral valve stenosis. Tricuspid Valve: The tricuspid valve is grossly normal. Tricuspid valve regurgitation is not demonstrated. No evidence of tricuspid stenosis. Aortic Valve: The aortic valve is tricuspid. Aortic valve regurgitation is mild. No aortic stenosis is present. Pulmonic Valve: The pulmonic valve was grossly normal. Pulmonic valve regurgitation is trivial. No evidence of pulmonic stenosis. Aorta: The aortic root and ascending aorta are structurally normal, with no evidence of dilitation. Venous: The inferior vena cava is dilated in size with greater than 50% respiratory variability, suggesting right atrial pressure of 8 mmHg. IAS/Shunts: The atrial septum is grossly normal.  LEFT VENTRICLE PLAX 2D LVIDd:         4.00 cm     Diastology LVIDs:         2.20 cm     LV e' medial:    5.61 cm/s LV PW:         1.00 cm     LV E/e' medial:  11.0 LV IVS:        1.00 cm     LV e' lateral:   8.76 cm/s LVOT diam:     2.20 cm     LV E/e' lateral: 7.0 LV SV:         67 LV SV Index:   36 LVOT Area:     3.80 cm  LV Volumes (MOD) LV vol d, MOD A2C: 80.6 ml LV vol d, MOD A4C: 75.6 ml LV vol s, MOD A2C: 27.5 ml LV vol s, MOD A4C: 28.9 ml LV SV MOD A2C:     53.1 ml LV SV MOD A4C:     75.6 ml LV SV MOD BP:      50.6 ml RIGHT VENTRICLE RV Basal diam:  3.60 cm RV Mid diam:    3.20 cm RV S prime:     9.73 cm/s TAPSE (M-mode): 1.8 cm LEFT ATRIUM             Index        RIGHT ATRIUM           Index LA diam:        2.60 cm 1.40 cm/m   RA Area:     11.30 cm LA Vol (A2C):  24.0 ml 12.91 ml/m  RA Volume:   22.00 ml  11.84 ml/m LA Vol (A4C):   35.3 ml 18.99 ml/m LA Biplane Vol: 29.7 ml 15.98 ml/m  AORTIC VALVE LVOT Vmax:   80.90 cm/s LVOT Vmean:  50.600 cm/s LVOT VTI:    0.176 m  AORTA Ao Root diam: 3.90 cm Ao Asc diam:  3.60 cm MITRAL VALVE               TRICUSPID  VALVE MV Area (PHT): 3.72 cm    TR Peak grad:   19.4 mmHg MV Decel Time: 204 msec    TR Vmax:        220.00 cm/s MV E velocity: 61.70 cm/s MV A velocity: 57.40 cm/s  SHUNTS MV E/A ratio:  1.07        Systemic VTI:  0.18 m                            Systemic Diam: 2.20 cm Eleonore Chiquito MD Electronically signed by Eleonore Chiquito MD Signature Date/Time: 05/21/2022/7:44:50 PM    Final    CT CORONARY FRACTIONAL FLOW RESERVE FLUID ANALYSIS  Result Date: 05/16/2022 EXAM: CT FFR analysis was performed on the original cardiac CTA dataset. Diagrammatic representation of the CT FFR analysis is provided in a separate PDF document in PACS. This dictation was created using the PDF document and an interactive 3D model of the results. The 3D model is not available in the EMR/PACS. INTERPRETATION: CT FFR provides simultaneous calculation of pressure and flow across the entire coronary tree. For clinical decision making, CT FFR values should be obtained 1-2 cm distal to the lower border of each stenosis measured. Coronary CTA-related artifacts may impair the diagnostic accuracy of the original cardiac CTA and FFR CT results. *Due to the fact that CT FFR represents a mathematically-derived analysis, it is recommended that the results be interpreted as follows: 1. CT FFR >0.80: Low likelihood of hemodynamic significance. 2. CT FFR 0.76-0.80: Borderline likelihood of hemodynamic significance. 3. CT FFR =< 0.75: High likelihood of hemodynamic significance. *Coronary CT Angiography-derived Fractional Flow Reserve Testing in Patients with Stable Coronary Artery Disease: Recommendations on Interpretation and Reporting. Radiology: Cardiothoracic Imaging. 2019;1(5):e190050 FINDINGS: 1. Left Main: 0.99; low likelihood of hemodynamic significance. 2. Prox LAD: 0.57; low likelihood of hemodynamic significance. 3. LCX: 0.91; low likelihood of hemodynamic significance. 4. RCA: 0.88; low likelihood of hemodynamic significance. IMPRESSION: 1.   Flow limiting stenosis in the proximal LAD (CT FFR 0.57). Eleonore Chiquito, MD Electronically Signed   By: Eleonore Chiquito M.D.   On: 05/16/2022 17:32   CT CORONARY MORPH W/CTA COR W/SCORE W/CA W/CM &/OR WO/CM  Addendum Date: 05/16/2022   ADDENDUM REPORT: 05/16/2022 17:28 CLINICAL DATA:  Chest pain EXAM: Cardiac/Coronary CTA TECHNIQUE: A non-contrast, gated CT scan was obtained with axial slices of 3 mm through the heart for calcium scoring. Calcium scoring was performed using the Agatston method. A 120 kV prospective, gated, contrast cardiac scan was obtained. Gantry rotation speed was 250 msecs and collimation was 0.6 mm. Two sublingual nitroglycerin tablets (0.8 mg) were given. The 3D data set was reconstructed in 5% intervals of the 35-75% of the R-R cycle. Diastolic phases were analyzed on a dedicated workstation using MPR, MIP, and VRT modes. The patient received 95 cc of contrast. FINDINGS: Image quality: Excellent. Noise artifact is: Limited. Coronary Arteries:  Normal coronary origin.  Right dominance. Left main: The left main is a  large caliber vessel with a normal take off from the left coronary cusp that bifurcates to form a left anterior descending artery and a left circumflex artery. There is minimal mixed density plaque (<25%). Left anterior descending artery: The proximal LAD contains a severe mixed density plaque (70-99%). The mid and distal segments are patent. D1 is patent. D2 contains minimal non-calcified plaque (<25%). Left circumflex artery: The LCX is non-dominant. The proximal and mid segments contains minimal mixed density plaque (<25%). OM1 contains minimal mixed density plaque (<25%). OM2 is patent. Right coronary artery: The RCA is dominant with normal take off from the right coronary cusp. There is minimal calcified plaque (<25%). The RCA terminates as a PDA and right posterolateral branch without evidence of plaque or stenosis. Right Atrium: Right atrial size is within normal limits.  Right Ventricle: The right ventricular cavity is within normal limits. Left Atrium: Left atrial size is normal in size with no left atrial appendage filling defect. Left Ventricle: The ventricular cavity size is within normal limits. Pulmonary arteries: Normal in size without proximal filling defect. Pulmonary veins: Normal pulmonary venous drainage. Pericardium: Normal thickness without significant effusion or calcium present. Cardiac valves: The aortic valve is trileaflet without significant calcification. The mitral valve is normal without significant calcification. Aorta: Normal caliber without significant disease. Extra-cardiac findings: See attached radiology report for non-cardiac structures. IMPRESSION: 1. Coronary calcium score of 675. This was 70th percentile for age-, sex, and race-matched controls. Plaque volume 549 mm3. 2. Normal coronary origin with right dominance. 3. Severe stenosis in the proximal LAD (70-99%). 4. Minimal CAD (<25%) in the LCX/RCA. RECOMMENDATIONS: 1. Severe stenosis in the proximal LAD. (70-99% or > 50% left main). Cardiac catheterization or CT FFR is recommended. Consider symptom-guided anti-ischemic pharmacotherapy as well as risk factor modification per guideline directed care. Invasive coronary angiography recommended with revascularization per published guideline statements. Eleonore Chiquito, MD Electronically Signed   By: Eleonore Chiquito M.D.   On: 05/16/2022 17:28   Result Date: 05/16/2022 EXAM: OVER-READ INTERPRETATION  CT CHEST The following report is a limited chest CT over-read performed by radiologist Dr. Marijo Sanes of [facility], [place] on 05/16/2022. The coronary CTA interpretation by the cardiologist is attached. COMPARISON:  None Available. FINDINGS: Vascular: Normal caliber thoracic aorta.  No dissection. Mediastinum/Nodes: No mediastinal or hilar mass or lymphadenopathy. The esophagus is grossly normal. There is a small hiatal hernia noted. Lungs/Pleura: No  acute pulmonary process. No pleural effusions. Mild dependent subpleural atelectasis. No worrisome pulmonary lesions or pulmonary nodules. Upper Abdomen: No significant upper abdominal findings. Musculoskeletal: No significant bony findings. IMPRESSION: No significant extracardiac findings. Electronically Signed: By: Marijo Sanes M.D. On: 05/16/2022 16:16    Disposition   Pt is being discharged home today in good condition.  Follow-up Plans & Appointments     Follow-up Information     O'Neal, Cassie Freer, MD Follow up on 06/13/2022.   Specialties: Cardiology, Internal Medicine, Radiology Why: at 8:40am for your follow up appt Contact information: [address] [place] [address] [phone]                Discharge Instructions     AMB Referral to Cardiac Rehabilitation - Phase II   Complete by: As directed    Diagnosis: Coronary Stents   After initial evaluation and assessments completed: Virtual Based Care may be provided alone or in conjunction with Phase 2 Cardiac Rehab based on patient barriers.: Yes      Discharge Medications   Allergies  as of 05/29/2022       Reactions   Ambien [zolpidem]    Unknown reaction   Bentyl [dicyclomine]    Memory loss, anxiety, blurred vision   Carafate [sucralfate] Nausea And Vomiting   Lansoprazole Diarrhea   Lexapro [escitalopram Oxalate]    Unknown reaction    Other    Pt states he is sensitive to all oral medications   Pneumovax [pneumococcal Polysaccharide Vaccine] Other (See Comments)   pain   Prilosec [omeprazole] Other (See Comments)   Per patient joint pain with PPI's   Remeron [mirtazapine]    Unknown reaction    Seroquel [quetiapine] Other (See Comments)   Unknown reaction    Statins Other (See Comments)   Joint pain   Tizanidine Other (See Comments)   Made patient "feel crazy"   Tramadol Other (See Comments)   Pt can take small amounts up to twice daily but continued use causes chest / abdominal  pain   Tylenol [acetaminophen] Nausea And Vomiting   Can only take for 2 takes then he starts to have pain        Medication List     STOP taking these medications    amitriptyline 50 MG tablet Commonly known as: ELAVIL   gabapentin 100 MG capsule Commonly known as: NEURONTIN       TAKE these medications    alum hydroxide-mag trisilicate 63-84 MG Chew chewable tablet Commonly known as: GAVISCON Chew 1-2 tablets by mouth 3 (three) times daily as needed for indigestion or heartburn.   amLODipine 2.5 MG tablet Commonly known as: NORVASC Take 1 tablet (2.5 mg total) by mouth daily.   aspirin EC 81 MG tablet Take 1 tablet (81 mg total) by mouth daily. Swallow whole.   clopidogrel 75 MG tablet Commonly known as: PLAVIX Take 1 tablet (75 mg total) by mouth daily with breakfast. Start taking on: May 30, 2022   Dialyvite Vitamin D 5000 125 MCG (5000 UT) capsule Generic drug: Cholecalciferol Take 5,000 Units by mouth daily.   diclofenac Sodium 1 % Gel Commonly known as: VOLTAREN Apply 1 Application topically 4 (four) times daily as needed (pain).   diphenoxylate-atropine 2.5-0.025 MG tablet Commonly known as: LOMOTIL Take 1 tablet by mouth 4 (four) times daily as needed for diarrhea or loose stools.   ezetimibe 10 MG tablet Commonly known as: ZETIA Take 1 tablet (10 mg total) by mouth daily.   LORazepam 0.5 MG tablet Commonly known as: Ativan Take 0.5 tablets (0.25 mg total) by mouth 2 (two) times daily as needed for anxiety.   nitroGLYCERIN 0.4 MG SL tablet Commonly known as: NITROSTAT Place 1 tablet (0.4 mg total) under the tongue every 5 (five) minutes as needed for chest pain.   pantoprazole 40 MG tablet Commonly known as: PROTONIX Take 1 tablet (40 mg total) by mouth daily.   traMADol 50 MG tablet Commonly known as: ULTRAM TAKE 1 TABLET BY MOUTH TWICE A DAY AS NEEDED   VICKS VAPORUB EX Apply 1 Application topically daily as needed (congestion).            Allergies Allergies  Allergen Reactions   Ambien [Zolpidem]     Unknown reaction   Bentyl [Dicyclomine]     Memory loss, anxiety, blurred vision   Carafate [Sucralfate] Nausea And Vomiting   Lansoprazole Diarrhea   Lexapro [Escitalopram Oxalate]     Unknown reaction    Other     Pt states he is sensitive to all oral medications  Pneumovax [Pneumococcal Polysaccharide Vaccine] Other (See Comments)    pain   Prilosec [Omeprazole] Other (See Comments)    Per patient joint pain with PPI's   Remeron [Mirtazapine]     Unknown reaction    Seroquel [Quetiapine] Other (See Comments)    Unknown reaction    Statins Other (See Comments)    Joint pain   Tizanidine Other (See Comments)    Made patient "feel crazy"   Tramadol Other (See Comments)    Pt can take small amounts up to twice daily but continued use causes chest / abdominal pain   Tylenol [Acetaminophen] Nausea And Vomiting    Can only take for 2 takes then he starts to have pain    Outstanding Labs/Studies   N/a   Duration of Discharge Encounter   Greater than 30 minutes including physician time.  Signed, Reino Bellis, NP 05/29/2022, 2:07 PM

## 2022-05-29 NOTE — Interval H&P Note (Signed)
History and Physical Interval Note:  05/29/2022 10:24 AM  Erik Lolling Sr.  has presented today for surgery, with the diagnosis of unstable angina.  The various methods of treatment have been discussed with the patient and family. After consideration of risks, benefits and other options for treatment, the patient has consented to  Procedure(s): LEFT HEART CATH AND CORONARY ANGIOGRAPHY (N/A) as a surgical intervention.  The patient's history has been reviewed, patient examined, no change in status, stable for surgery.  I have reviewed the patient's chart and labs.  Questions were answered to the patient's satisfaction.   Cath Lab Visit (complete for each Cath Lab visit)  Clinical Evaluation Leading to the Procedure:   ACS: No.  Non-ACS:    Anginal Classification: CCS III  Anti-ischemic medical therapy: Minimal Therapy (1 class of medications)  Non-Invasive Test Results: Intermediate-risk stress test findings: cardiac mortality 1-3%/year  Prior CABG: No previous CABG        Collier [facility]ina [facility] 05/29/2022 10:24 AM

## 2022-05-29 NOTE — Progress Notes (Signed)
CARDIAC REHAB PHASE I     Pt home education including ASA and Plavix importance, exercise guidelines, site restrictions and care, heart healthy diet, and CRP2. Pt expressed interest regarding CRP2. Order placed for [place]   Vanessa Barbara, RN BSN 05/29/2022 2:42 PM

## 2022-05-30 ENCOUNTER — Encounter: Payer: Self-pay | Admitting: Cardiovascular Disease

## 2022-05-30 ENCOUNTER — Encounter (HOSPITAL_COMMUNITY): Payer: Self-pay | Admitting: Cardiology

## 2022-05-30 DIAGNOSIS — Z932 Ileostomy status: Secondary | ICD-10-CM | POA: Diagnosis not present

## 2022-05-30 MED FILL — Nitroglycerin IV Soln 100 MCG/ML in D5W: INTRA_ARTERIAL | Qty: 10 | Status: AC

## 2022-05-31 ENCOUNTER — Telehealth (HOSPITAL_COMMUNITY): Payer: Self-pay

## 2022-05-31 IMAGING — RF DG ESOPHAGUS
12 of 15 series · 16 of 24 positions shown · non-contrast
Comparison: 02/17/2020 CT abdomen/pelvis.

CLINICAL DATA: History of Nissen fundoplication in 6558. Patient
reports history of esophageal dilation a few years prior. Patient
presents with dysphagia and chest pain with globus sensation in the
upper chest.

EXAM:
ESOPHOGRAM / BARIUM SWALLOW / BARIUM TABLET STUDY
TECHNIQUE: Combined double contrast and single contrast examination performed
using effervescent crystals, thick barium liquid, and thin barium
liquid. The patient was observed with fluoroscopy swallowing a 13 mm
barium sulphate tablet.
FLUOROSCOPY TIME:  Fluoroscopy Time:  2 minutes 42 seconds
Radiation Exposure Index (if provided by the fluoroscopic device):
49 mGy
Number of Acquired Spot Images: 9

[Series 1: fluoro_barium 2fps_bw · 0.17mm/px · 1 of 1 slices shown (1 of 3)]
[im 1/1]
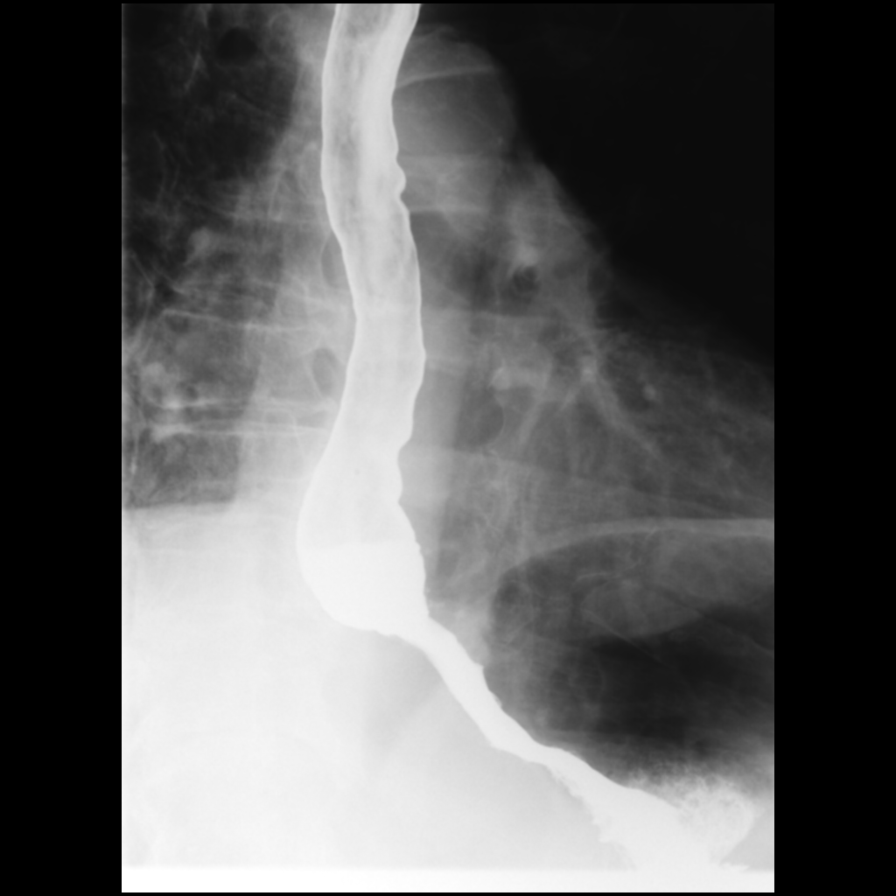

[Series 5: fluoro_barium 2fps_bw · 0.17mm/px · 1 of 1 slices shown (2 of 3)]
[im 1/1]
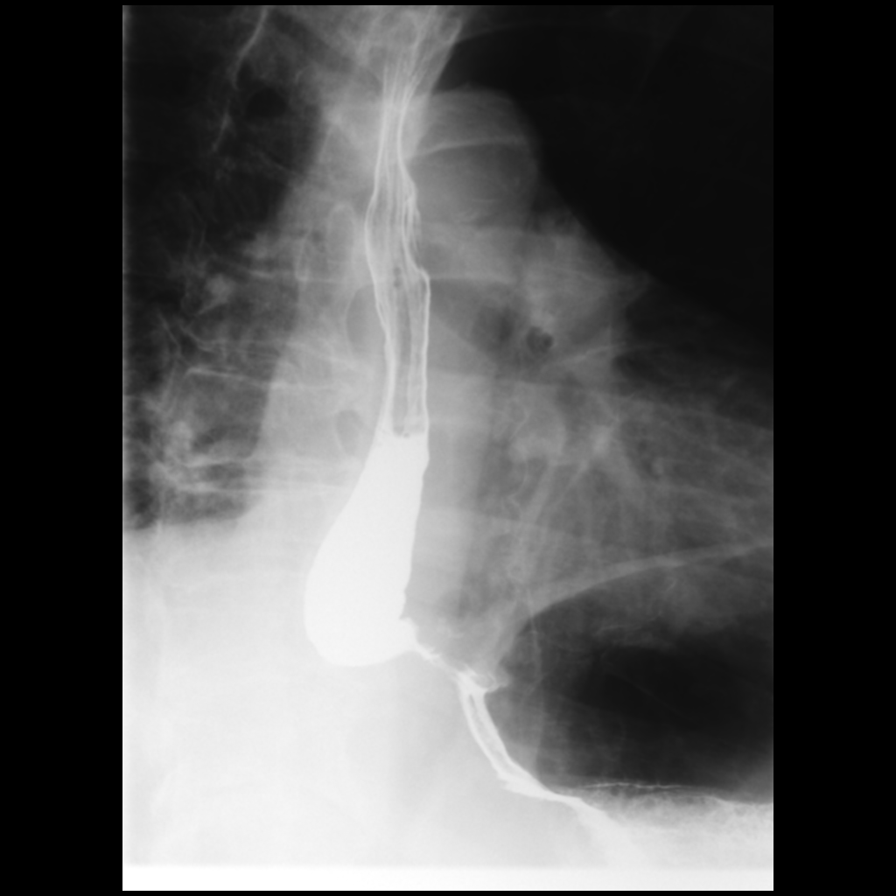

[Series 6: cp_standard · 0.35mm/px · 1 of 62 frames shown (1 of 9)]
[frame 32/62]
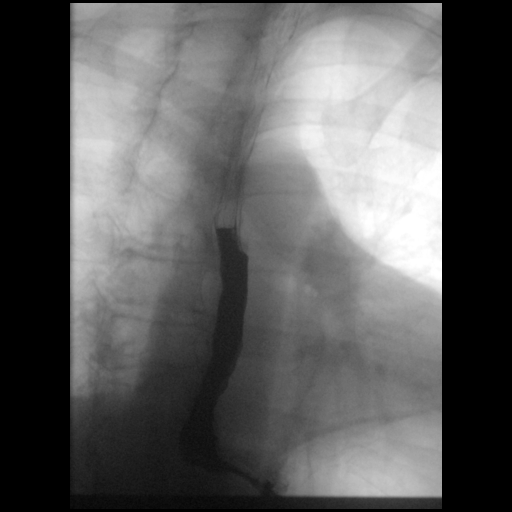

[Series 7: cp_standard · 0.34mm/px · 2 of 24 frames shown (2 of 9)]
[frame 13/24]
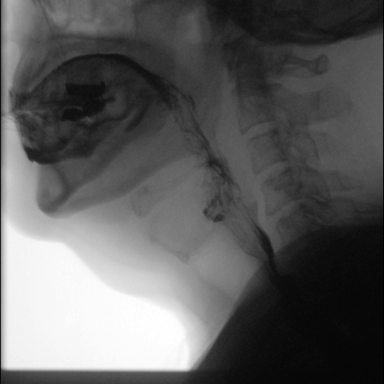
[frame 21/24]
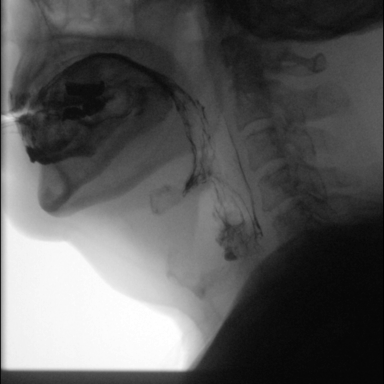

[Series 9: cp_standard · 0.34mm/px · 2 of 87 frames shown (3 of 9)]
[frame 3/87]
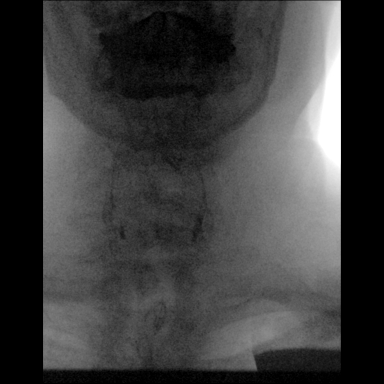
[frame 44/87]
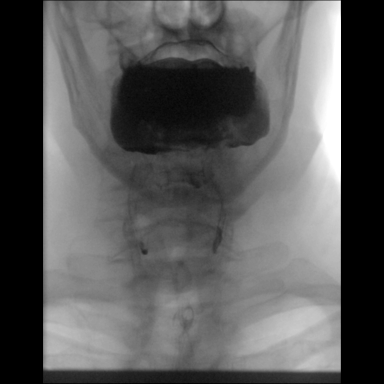

[Series 11: cp_standard · 0.53mm/px · 2 of 311 frames shown (4 of 9)]
[frame 90/311]
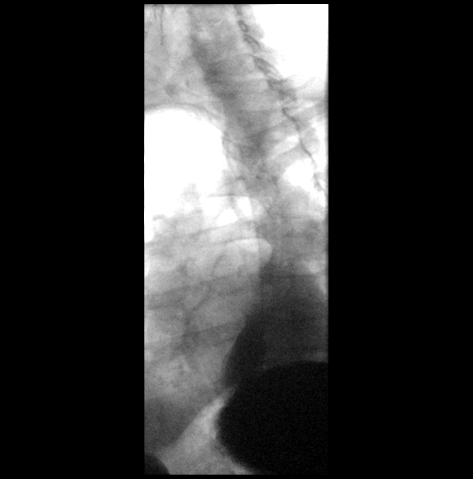
[frame 265/311]
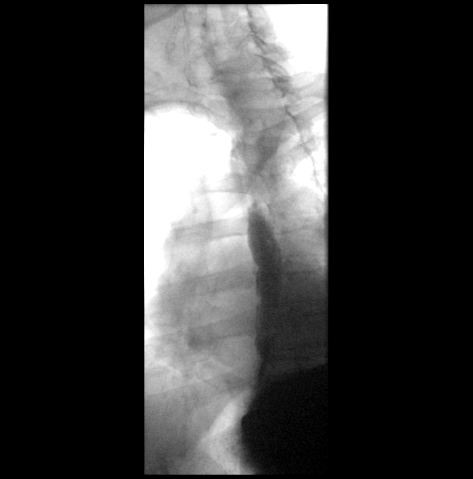

[Series 12: cp_standard · 0.54mm/px · 1 of 194 frames shown (5 of 9)]
[frame 165/194]
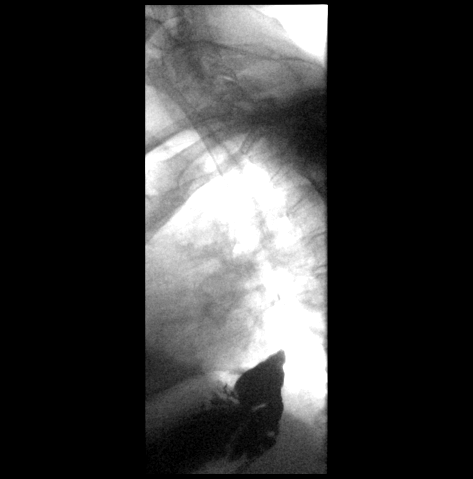

[Series 13: cp_standard · 0.36mm/px · 1 of 230 frames shown (6 of 9)]
[frame 116/230]
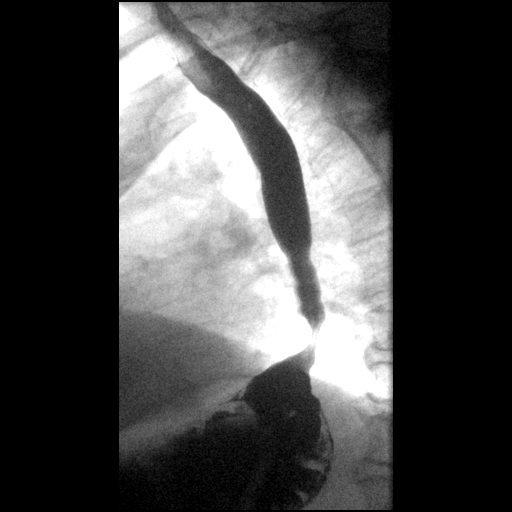

[Series 15: cp_standard · 0.55mm/px · 2 of 73 frames shown (7 of 9)]
[frame 10/73]
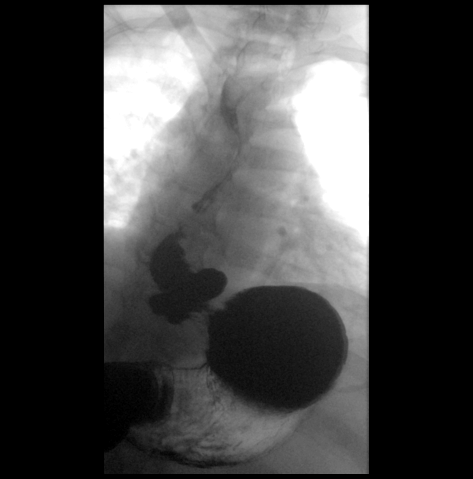
[frame 11/73]
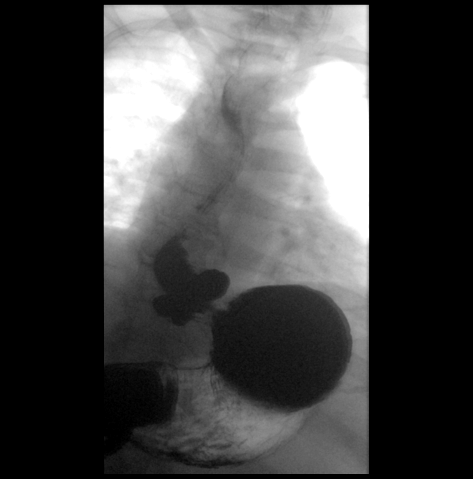

[Series 16: cp_standard · 0.55mm/px · 1 of 95 frames shown (8 of 9)]
[frame 48/95]
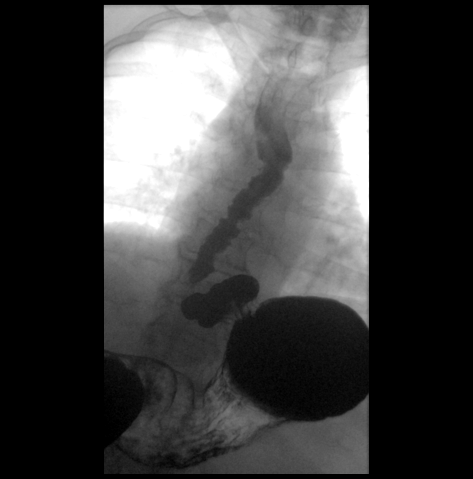

[Series 17: cp_standard · 0.53mm/px · 1 of 256 frames shown (9 of 9)]
[frame 13/256]
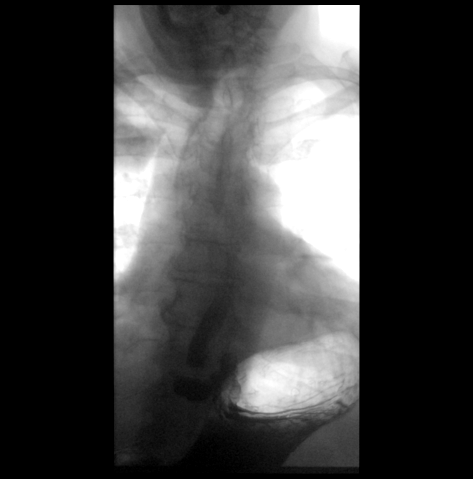

[Series 18: fluoro_barium 2fps_bw · 0.18mm/px · 1 of 1 slices shown (3 of 3)]
[im 1/1]
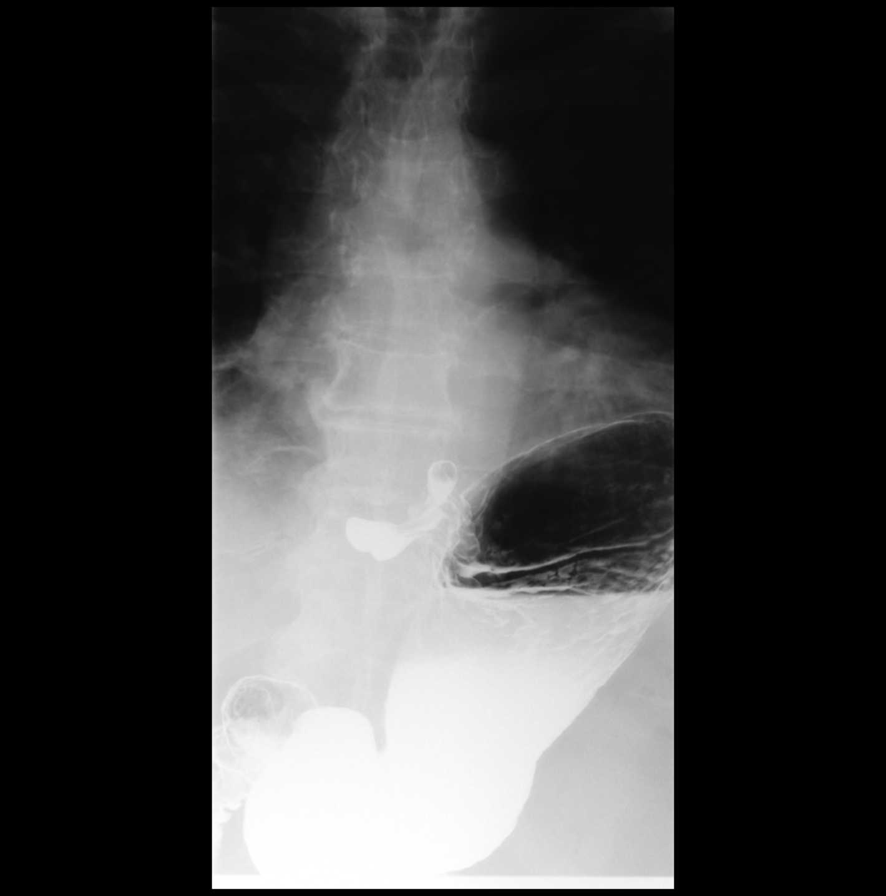

[16 of 24 positions shown; findings below may reference images not displayed]

FINDINGS: Normal oral and pharyngeal phases of swallowing, with no laryngeal
penetration or tracheobronchial aspiration. No significant barium
retention in the pharynx. No evidence of pharyngeal mass, stricture
or diverticulum. No significant cricopharyngeus muscle dysfunction.

Mild esophageal dysmotility, characterized by mild scattered
tertiary contractions and intermittent mild weakening in the lower
thoracic esophagus. Status post Nissen fundoplication with recurrent
small hiatal hernia. Mild gastroesophageal reflux elicited to the
level of mid thoracic esophagus with water siphon test. Normal
esophageal mucosa with no evidence of reflux esophagitis. Normal
esophageal distensibility, with no evidence of esophageal mass,
stricture or ulcer. Barium tablet traversed the esophagus into the
stomach without significant delay.
IMPRESSION: 1. Status post Nissen fundoplication with recurrent small hiatal
hernia. Mild gastroesophageal reflux elicited.
2. No evidence of reflux esophagitis. No evidence of esophageal
mass, stricture or ulcer.
3. Mild esophageal dysmotility, with features of both reflux related
dysmotility and presbyesophagus.

## 2022-06-07 ENCOUNTER — Encounter: Payer: Self-pay | Admitting: Cardiovascular Disease

## 2022-06-08 NOTE — Telephone Encounter (Addendum)
Patient stated he is not able to determine if pain is heart of gastric related. Recommended when he has pain, to take ntg to see if he gets relief. He stated he does not like ntg because it makes him irritable and aggressive. No pain at present. States BM is dark brown. Current BP 130/63, P 79. He denies sob or lightheadedness when he has pain, which lasts about 15-20 minutes. Last night, his heart raced for about 5 minutes. He wakens every night from 3-4 am since before his procedure (no symptoms at those times).  He stated the pain he gets is usually in the evening or at bedtime. He has taken 2 ntg that did provide relief one night recently. 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 He wants to know if it is safe to take antacids.

## 2022-06-12 NOTE — Progress Notes (Unsigned)
Cardiology Office Note:   Date:  06/13/2022  NAME:  Erik OTTEY Sr.    MRN: [id number] DOB:  [birth year]   PCP:  Maggie Schwalbe, PA-C  Cardiologist:  Evalina Field, MD  Electrophysiologist:  None   Referring MD: [name], MD   Chief Complaint  Patient presents with   Follow-up    History of Present Illness:   Erik Lolling Sr. is a 76 y.o. male with a hx of CAD s/p PCI, GI bleed, HLD who presents for follow-up.  He reports he is doing well since his stent procedure.  He has noticed some bruising and bleeding in the arms.  Overall still having some burning in his chest at night.  I suspect this is acid reflux related.  No bleeding from his ostomy site.  His EKG is normal.  His blood pressure is normal as well.  He does report some dizziness with medications.  We discussed stopping his amlodipine.  He has had for revascularization so he should not need this.  He also is noticing some bloating.  We discussed stopping Zetia.  I think he should be referred for PCSK9 inhibitor.  He is interested.  Overall doing well.  No overt bleeding.  Seems to be doing somewhat well.  He also is going to do cardiac rehab which I agree with.  Problem List CAD -70-99% pLAD; CT FFR 0.57 -PCI pLAD 05/28/2022 2. HTN 3. HLD -Tchol 238, HDL 47, LDL 153, TG 185 -Statin intolerant 4.  Gastritis -Massive GI bleed [facility] in 2021 -EGD 01/2022 with gastritis  Past Medical History: Past Medical History:  Diagnosis Date   Allergic rhinitis    Allergy    Anal fissure    Anemia    Anxiety    Blood transfusion without reported diagnosis    Cataract    starting   Coronary atherosclerosis of native coronary artery 2007   nonobstructive CAD by cath with 40% mid-LAD stenosis   Degenerative arthritis of knee, bilateral 10/09/2017   Depression    Diverticulosis 12/18/2011   Diverticulosis of colon with hemorrhage    April 2021, [facility]   Duodenal mass    Fibromyalgia    GERD with  stricture    Helicobacter pylori (H. 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   Vitamin D deficiency 11/29/2017    Past Surgical History: Past Surgical History:  Procedure Laterality Date   bladder distention     x 6   CARDIAC CATHETERIZATION     CATHETER REMOVAL     super pubic area   COLONOSCOPY     CORONARY STENT INTERVENTION N/A 05/29/2022   Procedure: CORONARY STENT INTERVENTION;  Surgeon: Martinique, Peter M, MD;  Location: [place] [facility];  Service: Cardiovascular;  Laterality: N/A;   DENTAL SURGERY Right    #30 tooth extraction (right upper)   INTERSTIM IMPLANT  PLACEMENT     INTERSTIM IMPLANT REMOVAL     KNEE ARTHROSCOPY     right x 2   LEFT HEART CATH AND CORONARY ANGIOGRAPHY N/A 05/29/2022   Procedure: LEFT HEART CATH AND CORONARY ANGIOGRAPHY;  Surgeon: Martinique, Peter M, MD;  Location: [place] [facility];  Service: Cardiovascular;  Laterality: N/A;   NISSEN FUNDOPLICATION  7858   PROSTECTOMY     ROBOT ASSISTED LAPAROSCOPIC COMPLETE CYSTECT ILEAL CONDUIT     SHOULDER ARTHROSCOPY  2010   left   TONSILLECTOMY AND ADENOIDECTOMY  1957   TOTAL KNEE ARTHROPLASTY Right 09/08/2021   Procedure: TOTAL KNEE ARTHROPLASTY;  Surgeon: Susa Day, MD;  Location: [facility];  Service: Orthopedics;  Laterality: Right;   TRANSURETHRAL RESECTION OF PROSTATE     x 2   UPPER GASTROINTESTINAL ENDOSCOPY  05/13/2008   hiatal hernia   VASECTOMY      Current Medications: Current  Meds  Medication Sig   aspirin EC 81 MG tablet Take 1 tablet (81 mg total) by mouth daily. Swallow whole.   Camphor-Eucalyptus-Menthol (VICKS VAPORUB EX) Apply 1 Application topically daily as needed (congestion).   Cholecalciferol (DIALYVITE VITAMIN D 5000) 125 MCG (5000 UT) capsule Take 5,000 Units by mouth daily.   clopidogrel (PLAVIX) 75 MG tablet Take 1 tablet (75 mg total) by mouth daily with breakfast.   diclofenac Sodium (VOLTAREN) 1 % GEL Apply 1 Application topically 4 (four) times daily as needed (pain).   diphenoxylate-atropine (LOMOTIL) 2.5-0.025 MG tablet Take 1 tablet by mouth 4 (four) times daily as needed for diarrhea or loose stools.   LORazepam (ATIVAN) 0.5 MG tablet Take 0.5 tablets (0.25 mg total) by mouth 2 (two) times daily as needed for anxiety.   nitroGLYCERIN (NITROSTAT) 0.4 MG SL tablet Place 1 tablet (0.4 mg total) under the tongue every 5 (five) minutes as needed for chest pain.   pantoprazole (PROTONIX) 40 MG tablet Take 1 tablet (40 mg total) by mouth daily.   traMADol (ULTRAM) 50 MG tablet TAKE 1 TABLET BY MOUTH TWICE A DAY AS NEEDED   [DISCONTINUED] amLODipine (NORVASC) 2.5 MG tablet Take 1 tablet (2.5 mg total) by mouth daily.   [DISCONTINUED] ezetimibe (ZETIA) 10 MG tablet Take 1 tablet (10 mg total) by mouth daily.     Allergies:    Ambien [zolpidem], Bentyl [dicyclomine], Carafate [sucralfate], Lansoprazole, Lexapro [escitalopram oxalate], Other, Pneumovax [pneumococcal polysaccharide vaccine], Prilosec [omeprazole], Remeron [mirtazapine], Seroquel [quetiapine], Statins, Tizanidine, Tramadol, and Tylenol [acetaminophen]   Social History: Social History   Socioeconomic History   Marital status: Married    Spouse name: Not on file   Number of children: 4   Years of education: Not on file   Highest education level: Not on file  Occupational History   Occupation: retired  Tobacco Use   Smoking status: Former   Smokeless tobacco: Never   Tobacco  comments:    quit 1980  Vaping Use   Vaping Use: Never used  Substance and Sexual Activity   Alcohol use: No    Alcohol/week: 0.0 standard drinks of alcohol   Drug use: No   Sexual activity: Yes    Partners: Female  Other Topics Concern   Not on file  Social History Narrative   Married, retired for children   He is a former smoker no alcohol or substance use   Social Determinants of Radio broadcast assistant Strain: Low Risk  (12/08/2021)   Overall Financial Resource Strain (CARDIA)    Difficulty of  Paying Living Expenses: Not hard at all  Food Insecurity: No Food Insecurity (12/08/2021)   Hunger Vital Sign    Worried About Running Out of Food in the Last Year: Never true    Ran Out of Food in the Last Year: Never true  Transportation Needs: No Transportation Needs (12/08/2021)   PRAPARE - Hydrologist (Medical): No    Lack of Transportation (Non-Medical): No  Physical Activity: Insufficiently Active (12/08/2021)   Exercise Vital Sign    Days of Exercise per Week: 2 days    Minutes of Exercise per Session: 30 min  Stress: No Stress Concern Present (12/08/2021)   [place]    Feeling of Stress : Only a little  Social Connections: Moderately Integrated (12/08/2021)   Social Connection and Isolation Panel [NHANES]    Frequency of Communication with Friends and Family: Three times a week    Frequency of Social Gatherings with Friends and Family: Three times a week    Attends Religious Services: More than 4 times per year    Active Member of Clubs or Organizations: No    Attends Archivist Meetings: Never    Marital Status: Married     Family History: The patient's family history includes Arthritis in his mother; Breast cancer in his sister; Colon polyps in his father; Diabetes in his father, paternal uncle, and sister; Heart disease in his father and mother; Hypertension in  his father and mother; Liver cancer in his maternal grandfather and paternal uncle; Stroke in his mother. There is no history of Colon cancer, Esophageal cancer, Rectal cancer, Stomach cancer, or Pancreatic cancer.  ROS:   All other ROS reviewed and negative. Pertinent positives noted in the HPI.     EKGs/Labs/Other Studies Reviewed:   The following studies were personally reviewed by me today:  EKG:  EKG is ordered today.  The ekg ordered today demonstrates NSR 69 bpm, no acute changes, and was personally reviewed by me.   TTE 05/21/2022  1. Left ventricular ejection fraction, by estimation, is 60 to 65%. The  left ventricle has normal function. The left ventricle has no regional  wall motion abnormalities. Left ventricular diastolic parameters are  consistent with Grade I diastolic  dysfunction (impaired relaxation).   2. Right ventricular systolic function is normal. The right ventricular  size is normal. There is normal pulmonary artery systolic pressure. The  estimated right ventricular systolic pressure is 27.0 mmHg.   3. The mitral valve is grossly normal. Trivial mitral valve  regurgitation. No evidence of mitral stenosis.   4. The aortic valve is tricuspid. Aortic valve regurgitation is mild. No  aortic stenosis is present.   5. The inferior vena cava is dilated in size with >50% respiratory  variability, suggesting right atrial pressure of 8 mmHg.   LHC 05/29/2022 Single vessel obstructive CAD Normal LVEDP Successful PCI of the proximal to mid LAD with OCT guidance and DES x 1  Recent Labs: 01/22/2022: TSH 3.25 04/16/2022: ALT 14 05/18/2022: BUN 22; Creatinine, Ser 1.41; Hemoglobin 14.5; Platelets 326; Potassium 5.2; Sodium 139   Recent Lipid Panel    Component Value Date/Time   CHOL 238 (H) 01/22/2022 1049   TRIG 185.0 (H) 01/22/2022 1049   HDL 47.10 01/22/2022 1049   CHOLHDL 5 01/22/2022 1049   VLDL 37.0 01/22/2022 1049   LDLCALC 153 (H) 01/22/2022 1049   LDLDIRECT  169.0 06/24/2018 1408    Physical  Exam:   VS:  BP 120/68   Pulse 69   Ht 5' 6.5" (1.689 m)   Wt 169 lb 12.8 oz (77 kg)   SpO2 97%   BMI 27.00 kg/m    Wt Readings from Last 3 Encounters:  06/13/22 169 lb 12.8 oz (77 kg)  05/29/22 160 lb (72.6 kg)  05/18/22 168 lb 6.4 oz (76.4 kg)    General: Well nourished, well developed, in no acute distress Head: Atraumatic, normal size  Eyes: PEERLA, EOMI  Neck: Supple, no JVD Endocrine: No thryomegaly Cardiac: Normal S1, S2; RRR; no murmurs, rubs, or gallops Lungs: Clear to auscultation bilaterally, no wheezing, rhonchi or rales  Abd: Soft, nontender, no hepatomegaly  Ext: No edema, pulses 2+ Musculoskeletal: No deformities, BUE and BLE strength normal and equal Skin: Warm and dry, no rashes   Neuro: Alert and oriented to person, place, time, and situation, CNII-XII grossly intact, no focal deficits  Psych: Normal mood and affect   ASSESSMENT:   Erik Lolling Sr. is a 76 y.o. male who presents for the following: 1. Coronary artery disease of native artery of native heart with stable angina pectoris ([place])   2. Mixed hyperlipidemia   3. Status post primary angioplasty with coronary stent     PLAN:   1. Coronary artery disease of native artery of native heart with stable angina pectoris ([place]) 2. Mixed hyperlipidemia 3. Status post primary angioplasty with coronary stent -Status post PCI to the proximal LAD.  On aspirin and Plavix.  He is having some GI distress.  We will stop Zetia.  Statin intolerant.  Referral to pharmacy clinic for PCSK9 inhibitor therapy.  He also reports some dizziness.  Stop amlodipine.  EF is normal.  No other significant stenoses.  EKG is normal as well.  Would recommend to continue aspirin Plavix for at least 6 months.  He is not having any bleeding.  I want him on Protonix while he is on these medications.  We discussed switching to Brilinta but he would like continue Plavix for now.  I think this is reasonable.   We will stop amlodipine as well as Zetia to see if this makes a difference.  He will see me back in 6 to 8 weeks for further reassessment.  Disposition: Return in about 6 weeks (around 07/25/2022).  Medication Adjustments/Labs and Tests Ordered: Current medicines are reviewed at length with the patient today.  Concerns regarding medicines are outlined above.  Orders Placed This Encounter  Procedures   AMB Referral to [facility]   EKG 12-Lead   No orders of the defined types were placed in this encounter.   Patient Instructions  Medication Instructions:  STOP Amlodipine  STOP Zetia  *If you need a refill on your cardiac medications before your next appointment, please call your pharmacy*   Follow-Up: At [facility], you and your health needs are our priority.  As part of our continuing mission to provide you with exceptional heart care, we have created designated Provider Care Teams.  These Care Teams include your primary Cardiologist (physician) and Advanced Practice Providers (APPs -  Physician Assistants and Nurse Practitioners) who all work together to provide you with the care you need, when you need it.  We recommend signing up for the patient portal called "MyChart".  Sign up information is provided on this After Visit Summary.  MyChart is used to connect with patients for Virtual Visits (Telemedicine).  Patients are able to view lab/test results, encounter  notes, upcoming appointments, etc.  Non-urgent messages can be sent to your provider as well.   To learn more about what you can do with MyChart, go to [email or web address].    Your next appointment:   6 week(s)  The format for your next appointment:   In Person  Provider:   Evalina Field, MD       PharmD referral- they will contact you for an appointment.       Time Spent with Patient: I have spent a total of 35 minutes with patient reviewing hospital notes, telemetry, EKGs, labs and examining the  patient as well as establishing an assessment and plan that was discussed with the patient.  > 50% of time was spent in direct patient care.  Signed, Addison Naegeli. Audie Box, MD, [place]  [address] [place], [place] [address] [phone]  06/13/2022 9:10 AM

## 2022-06-13 ENCOUNTER — Ambulatory Visit: Payer: Medicare Other | Admitting: Cardiovascular Disease

## 2022-06-13 ENCOUNTER — Encounter: Payer: Self-pay | Admitting: Cardiovascular Disease

## 2022-06-13 VITALS — BP 120/68 | HR 69 | Ht 66.5 in | Wt 169.8 lb

## 2022-06-13 DIAGNOSIS — Z955 Presence of coronary angioplasty implant and graft: Secondary | ICD-10-CM

## 2022-06-13 DIAGNOSIS — E782 Mixed hyperlipidemia: Secondary | ICD-10-CM

## 2022-06-13 DIAGNOSIS — I25118 Atherosclerotic heart disease of native coronary artery with other forms of angina pectoris: Secondary | ICD-10-CM | POA: Diagnosis not present

## 2022-06-13 NOTE — Patient Instructions (Signed)
Medication Instructions:  STOP Amlodipine  STOP Zetia  *If you need a refill on your cardiac medications before your next appointment, please call your pharmacy*   Follow-Up: At [facility], you and your health needs are our priority.  As part of our continuing mission to provide you with exceptional heart care, we have created designated Provider Care Teams.  These Care Teams include your primary Cardiologist (physician) and Advanced Practice Providers (APPs -  Physician Assistants and Nurse Practitioners) who all work together to provide you with the care you need, when you need it.  We recommend signing up for the patient portal called "MyChart".  Sign up information is provided on this After Visit Summary.  MyChart is used to connect with patients for Virtual Visits (Telemedicine).  Patients are able to view lab/test results, encounter notes, upcoming appointments, etc.  Non-urgent messages can be sent to your provider as well.   To learn more about what you can do with MyChart, go to [email or web address].    Your next appointment:   6 week(s)  The format for your next appointment:   In Person  Provider:   Evalina Field, MD       PharmD referral- they will contact you for an appointment.

## 2022-06-18 DIAGNOSIS — K435 Parastomal hernia without obstruction or  gangrene: Secondary | ICD-10-CM | POA: Diagnosis not present

## 2022-06-18 DIAGNOSIS — Z9889 Other specified postprocedural states: Secondary | ICD-10-CM | POA: Diagnosis not present

## 2022-06-18 DIAGNOSIS — N3010 Interstitial cystitis (chronic) without hematuria: Secondary | ICD-10-CM | POA: Diagnosis not present

## 2022-06-19 DIAGNOSIS — Z932 Ileostomy status: Secondary | ICD-10-CM | POA: Diagnosis not present

## 2022-06-21 DIAGNOSIS — K1379 Other lesions of oral mucosa: Secondary | ICD-10-CM | POA: Diagnosis not present

## 2022-06-28 DIAGNOSIS — M79671 Pain in right foot: Secondary | ICD-10-CM | POA: Diagnosis not present

## 2022-06-28 DIAGNOSIS — M722 Plantar fascial fibromatosis: Secondary | ICD-10-CM | POA: Diagnosis not present

## 2022-06-28 DIAGNOSIS — M79672 Pain in left foot: Secondary | ICD-10-CM | POA: Diagnosis not present

## 2022-07-02 ENCOUNTER — Encounter: Payer: Self-pay | Admitting: Cardiovascular Disease

## 2022-07-02 ENCOUNTER — Telehealth: Payer: Self-pay | Admitting: Cardiovascular Disease

## 2022-07-02 ENCOUNTER — Other Ambulatory Visit: Payer: Self-pay

## 2022-07-02 MED ORDER — TICAGRELOR 90 MG PO TABS: 90.0000 mg | ORAL_TABLET | Freq: Two times a day (BID) | ORAL | 3 refills | Status: DC

## 2022-07-02 NOTE — Telephone Encounter (Signed)
  Pt c/o medication issue:  1. Name of Medication: clopidogrel (PLAVIX) 75 MG tablet pantoprazole (PROTONIX) 40 MG tablet LORazepam (ATIVAN) 0.5 MG tablet aspirin EC 81 MG tablet And Vit. D  2. How are you currently taking this medication (dosage and times per day)? As written  3. Are you having a reaction (difficulty breathing--STAT)?   4. What is your medication issue? Pt said, he has some questions about this medications

## 2022-07-02 NOTE — Telephone Encounter (Signed)
Called patient- advised of message below.  RX sent to pharmacy.  Patient aware to let us know of how he is feeling.

## 2022-07-02 NOTE — Telephone Encounter (Signed)
Called patient, he states that since July he has been having an issue with not sleeping and feeling of his "insides burning". He states this all started when he began taking all of the medications listed below (plavix, protonix are the two he specifically mentioned). He is not sure which medication is causing it, but he states he has went the last 7 days with hardly no sleep and feeling uncomfortable. He states if something is not done he is going to go crazy.    I advised I would route a message to MD to review, patient verbalized understanding. Will await call back.

## 2022-07-04 ENCOUNTER — Encounter: Payer: Self-pay | Admitting: Cardiovascular Disease

## 2022-07-04 ENCOUNTER — Other Ambulatory Visit: Payer: Self-pay

## 2022-07-04 MED ORDER — CLOPIDOGREL BISULFATE 75 MG PO TABS: 75.0000 mg | ORAL_TABLET | Freq: Every day | ORAL | 3 refills | Status: DC

## 2022-07-04 NOTE — Telephone Encounter (Signed)
Patient informed to stop brilinta and to go back on Plavix '75mg'$  daily, along with ASA '81mg'$  daily. Pt. verbalized understanding. Prescription for plavix '75mg'$  daily sent to patient's preferred pharmacy.

## 2022-07-05 ENCOUNTER — Encounter: Payer: Self-pay | Admitting: Pharmacist Clinician (PhC)/ Clinical Pharmacy Specialist

## 2022-07-05 ENCOUNTER — Ambulatory Visit: Payer: Medicare Other | Admitting: Pharmacist Clinician (PhC)/ Clinical Pharmacy Specialist

## 2022-07-05 DIAGNOSIS — E7800 Pure hypercholesterolemia, unspecified: Secondary | ICD-10-CM

## 2022-07-05 NOTE — Assessment & Plan Note (Signed)
Patient with new ASCVD, s/p stenting just last month.  Has been unable to tolerate statins in the past, but he is unsure how many different ones were tried.  Stopped ezetimibe, as he thought it might be the cause of his current GI symptoms, but has been off for several weeks and symptoms are unchanged.   He is not interested in starting any medication at this time, instead wants to clear up whatever GI issue he is dealing with.  Advised that he reach out to his GI practice and let them know what he is feeling, and should his symptoms be severe enough, he needs to go to ED.  Gave him my contact information and encouraged him to reach out when he is ready to go on medications, stressing that we should not wait too long.  Patient voiced understanding.

## 2022-07-05 NOTE — Patient Instructions (Addendum)
Your Results:             Your most recent labs Goal  Total Cholesterol 238 < 200  Triglycerides 185 < 150  HDL (happy/good cholesterol) 47.1 > 40  LDL (lousy/bad cholesterol 153 < 70   When you are ready to start medications to lower your cholesterol, please give me a call or send me a MyChart Message  - Tommy Medal at [phone]     Thank you for choosing [facility]

## 2022-07-05 NOTE — Progress Notes (Signed)
07/05/2022 Erik Lolling Sr. [birth year] [id number]   HPI:  Erik Lolling Sr. is a 76 y.o. male patient of Dr Audie Box, who presents today for a lipid clinic evaluation.  See pertinent past medical history below.  Today he tells me that he has been having severe stomach pains, bloating and diarrhea for the past several weeks.  He thought it was medications started after his stent placement in July, but he is only on aspirin, clopidogrel and pantoprazole.  After stopping ezetimibe, there was no appreciable change in his symptoms.  He has been working with GI, states that they want to do endoscopy and biopsy but that cardiology won't clear him for this.  Explained that we can't stop the clopidogrel for at least 6 months after stenting and he understands.  States that he was started on a gluten free diet about 10 days ago, but again, nothing has changed yet.     Past Medical History: ASCVD 7/23 S/P PCI to pLAD   GI bleed 2021 - treated at [facility]  prediabetes 3/23 A1c 6.3  hypertension Currently controlled w/o meds       Current Medications: none   Cholesterol Goals: LDL < 70   Intolerant/previously tried: ezetimibe - GI distress (probably not); statins caused joint pain  Family history: mother died from stroke at 1, had heart issues prior; father had hypertension, died at 25; sister w/rheumatic fever, 4 children all with high cholesterol, 2 on meds tolerating  Diet: more home cooked , no fried foods, variety of proteins; vegetables mostly fresh in summer months, frozen/canned in winter, snacks on chips/c/c most days  Exercise:  walks 15-20 minutes per day  Labs: 3/23: TC 238, TG 185, HDL 47.1, LDL 153   Current Outpatient Medications  Medication Sig Dispense Refill   amitriptyline (ELAVIL) 25 MG tablet Take 25 mg by mouth at bedtime.     aspirin EC 81 MG tablet Take 1 tablet (81 mg total) by mouth daily. Swallow whole. 90 tablet 3   Camphor-Eucalyptus-Menthol (VICKS VAPORUB  EX) Apply 1 Application topically daily as needed (congestion).     Cholecalciferol (DIALYVITE VITAMIN D 5000) 125 MCG (5000 UT) capsule Take 1,000 Units by mouth daily.     clopidogrel (PLAVIX) 75 MG tablet Take 1 tablet (75 mg total) by mouth daily. 90 tablet 3   diclofenac Sodium (VOLTAREN) 1 % GEL Apply 1 Application topically 4 (four) times daily as needed (pain).     diphenoxylate-atropine (LOMOTIL) 2.5-0.025 MG tablet Take 1 tablet by mouth 4 (four) times daily as needed for diarrhea or loose stools.     LORazepam (ATIVAN) 0.5 MG tablet Take 0.5 tablets (0.25 mg total) by mouth 2 (two) times daily as needed for anxiety. 30 tablet 2   nitroGLYCERIN (NITROSTAT) 0.4 MG SL tablet Place 1 tablet (0.4 mg total) under the tongue every 5 (five) minutes as needed for chest pain. 25 tablet 3   pantoprazole (PROTONIX) 40 MG tablet Take 1 tablet (40 mg total) by mouth daily. 90 tablet 3   traMADol (ULTRAM) 50 MG tablet TAKE 1 TABLET BY MOUTH TWICE A DAY AS NEEDED 120 tablet 2   No current facility-administered medications for this visit.    Allergies  Allergen Reactions   Ambien [Zolpidem]     Unknown reaction   Bentyl [Dicyclomine]     Memory loss, anxiety, blurred vision   Carafate [Sucralfate] Nausea And Vomiting   Lansoprazole Diarrhea   Lexapro [Escitalopram Oxalate]  Unknown reaction    Other     Pt states he is sensitive to all oral medications   Pneumovax [Pneumococcal Polysaccharide Vaccine] Other (See Comments)    pain   Prilosec [Omeprazole] Other (See Comments)    Per patient joint pain with PPI's   Remeron [Mirtazapine]     Unknown reaction    Seroquel [Quetiapine] Other (See Comments)    Unknown reaction    Statins Other (See Comments)    Joint pain   Tizanidine Other (See Comments)    Made patient "feel crazy"   Tramadol Other (See Comments)    Pt can take small amounts up to twice daily but continued use causes chest / abdominal pain   Tylenol [Acetaminophen]  Nausea And Vomiting    Can only take for 2 takes then he starts to have pain    Past Medical History:  Diagnosis Date   Allergic rhinitis    Allergy    Anal fissure    Anemia    Anxiety    Blood transfusion without reported diagnosis    Cataract    starting   Coronary atherosclerosis of native coronary artery 2007   nonobstructive CAD by cath with 40% mid-LAD stenosis   Degenerative arthritis of knee, bilateral 10/09/2017   Depression    Diverticulosis 12/18/2011   Diverticulosis of colon with hemorrhage    April 2021, [facility]   Duodenal mass    Fibromyalgia    GERD with stricture    Helicobacter pylori (H. 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   Hyperlipidemia Patient with new ASCVD, s/p stenting just last month.  Has been unable to tolerate statins in the past, but he is unsure how many different ones were tried.  Stopped ezetimibe, as he thought it might be the cause of his current GI symptoms, but has been off for several weeks and symptoms are unchanged.   He is not interested in starting any medication at this time, instead wants to clear up whatever GI issue he is dealing with.  Advised that he reach out to his GI practice and let them know what he is feeling, and should his symptoms be severe enough, he needs to go to ED.  Gave him my contact information and encouraged  him to reach out when he is ready to go on medications, stressing that we should not wait too long.  Patient voiced understanding.    Tommy Medal PharmD CPP [facility] [address] [place], [place] [address] [phone]

## 2022-07-12 ENCOUNTER — Encounter: Payer: Self-pay | Admitting: Cardiovascular Disease

## 2022-07-14 DIAGNOSIS — Z932 Ileostomy status: Secondary | ICD-10-CM | POA: Diagnosis not present

## 2022-07-16 NOTE — Progress Notes (Signed)
Cardiology Office Note:   Date:  07/19/2022  NAME:  Erik ARSCOTT Sr.    MRN: [id number] DOB:  1946/10/14   PCP:  Maggie Schwalbe, PA-C  Cardiologist:  Evalina Field, MD  Electrophysiologist:  None   Referring MD: [name], MD   Chief Complaint  Patient presents with   Follow-up   History of Present Illness:   Erik Lolling Sr. is a 76 y.o. male with a hx of CAD s/p PCI to the pLAD, GI bleed who presents for follow-up.  He reports he is doing better on plavix. Stopped Brilinta.  Still having issues with headache and fatigue.  Unsure if Plavix is causing this.  He is trying to get off amitriptyline as well as lorazepam.  These clearly could be contributing.  He has had difficulties tolerating medications.  Not wanting to try Repatha until he is off Plavix.  Unclear what is causing most of his symptoms.  Blood pressure is well controlled.  No further chest pain episodes.  Problem List CAD -70-99% pLAD; CT FFR 0.57 -PCI pLAD 05/28/2022 2. HTN 3. HLD -Tchol 238, HDL 47, LDL 153, TG 185 -Statin intolerant 4.  Gastritis -Massive GI bleed [facility] in 2021 -EGD 01/2022 with gastritis  Past Medical History: Past Medical History:  Diagnosis Date   Allergic rhinitis    Allergy    Anal fissure    Anemia    Anxiety    Blood transfusion without reported diagnosis    Cataract    starting   Coronary atherosclerosis of native coronary artery 2007   nonobstructive CAD by cath with 40% mid-LAD stenosis   Degenerative arthritis of knee, bilateral 10/09/2017   Depression    Diverticulosis 12/18/2011   Diverticulosis of colon with hemorrhage    April 2021, [facility] healthcare   Duodenal mass    Fibromyalgia    GERD with stricture    Helicobacter pylori (H. 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   Vitamin D deficiency 11/29/2017    Past Surgical History: Past Surgical History:  Procedure Laterality Date   bladder distention     x 6   CARDIAC CATHETERIZATION     CATHETER REMOVAL     super pubic area   COLONOSCOPY     CORONARY STENT INTERVENTION N/A 05/29/2022   Procedure: CORONARY STENT INTERVENTION;  Surgeon: Martinique, Peter M, MD;  Location: [place] [facility];  Service: Cardiovascular;  Laterality: N/A;   DENTAL SURGERY Right    #30 tooth extraction (right upper)   INTERSTIM IMPLANT PLACEMENT     INTERSTIM IMPLANT REMOVAL     KNEE ARTHROSCOPY     right x 2   LEFT HEART CATH AND CORONARY ANGIOGRAPHY N/A 05/29/2022   Procedure: LEFT HEART CATH AND CORONARY ANGIOGRAPHY;  Surgeon: Martinique, Peter M, MD;  Location: [place] [facility];  Service: Cardiovascular;  Laterality:  N/A;   NISSEN FUNDOPLICATION  4332   PROSTECTOMY     ROBOT ASSISTED LAPAROSCOPIC COMPLETE CYSTECT ILEAL CONDUIT     SHOULDER ARTHROSCOPY  2010   left   TONSILLECTOMY AND ADENOIDECTOMY  1957   TOTAL KNEE ARTHROPLASTY Right 09/08/2021   Procedure: TOTAL KNEE ARTHROPLASTY;  Surgeon: Susa Day, MD;  Location: [facility];  Service: Orthopedics;  Laterality: Right;   TRANSURETHRAL RESECTION OF PROSTATE     x 2   UPPER GASTROINTESTINAL ENDOSCOPY  05/13/2008   hiatal hernia   VASECTOMY      Current Medications: Current Meds  Medication Sig   amitriptyline (ELAVIL) 25 MG tablet Take 25 mg by mouth at bedtime.   aspirin EC 81 MG tablet Take 1 tablet (81 mg total) by mouth daily. Swallow whole.   Camphor-Eucalyptus-Menthol (VICKS VAPORUB EX) Apply 1 Application topically daily  as needed (congestion).   Cholecalciferol (DIALYVITE VITAMIN D 5000) 125 MCG (5000 UT) capsule Take 1,000 Units by mouth daily.   clopidogrel (PLAVIX) 75 MG tablet Take 1 tablet (75 mg total) by mouth daily.   diclofenac Sodium (VOLTAREN) 1 % GEL Apply 1 Application topically 4 (four) times daily as needed (pain).   diphenoxylate-atropine (LOMOTIL) 2.5-0.025 MG tablet Take 1 tablet by mouth 4 (four) times daily as needed for diarrhea or loose stools.   LORazepam (ATIVAN) 0.5 MG tablet Take 0.5 tablets (0.25 mg total) by mouth 2 (two) times daily as needed for anxiety.   nitroGLYCERIN (NITROSTAT) 0.4 MG SL tablet Place 1 tablet (0.4 mg total) under the tongue every 5 (five) minutes as needed for chest pain.   pantoprazole (PROTONIX) 40 MG tablet Take 1 tablet (40 mg total) by mouth daily.   traMADol (ULTRAM) 50 MG tablet TAKE 1 TABLET BY MOUTH TWICE A DAY AS NEEDED     Allergies:    Ambien [zolpidem], Bentyl [dicyclomine], Carafate [sucralfate], Lansoprazole, Lexapro [escitalopram oxalate], Other, Pneumovax [pneumococcal polysaccharide vaccine], Prilosec [omeprazole], Remeron [mirtazapine], Seroquel [quetiapine], Statins, Tizanidine, Tramadol, and Tylenol [acetaminophen]   Social History: Social History   Socioeconomic History   Marital status: Married    Spouse name: Not on file   Number of children: 4   Years of education: Not on file   Highest education level: Not on file  Occupational History   Occupation: retired  Tobacco Use   Smoking status: Former   Smokeless tobacco: Never   Tobacco comments:    quit 1980  Vaping Use   Vaping Use: Never used  Substance and Sexual Activity   Alcohol use: No    Alcohol/week: 0.0 standard drinks of alcohol   Drug use: No   Sexual activity: Yes    Partners: Female  Other Topics Concern   Not on file  Social History Narrative   Married, retired for children   He is a former smoker no alcohol or substance use   Social Determinants of  Radio broadcast assistant Strain: Low Risk  (12/08/2021)   Overall Financial Resource Strain (CARDIA)    Difficulty of Paying Living Expenses: Not hard at all  Food Insecurity: No Food Insecurity (12/08/2021)   Hunger Vital Sign    Worried About Running Out of Food in the Last Year: Never true    Ran Out of Food in the Last Year: Never true  Transportation Needs: No Transportation Needs (12/08/2021)   PRAPARE - Hydrologist (Medical): No    Lack of Transportation (Non-Medical): No  Physical Activity: Insufficiently  Active (12/08/2021)   Exercise Vital Sign    Days of Exercise per Week: 2 days    Minutes of Exercise per Session: 30 min  Stress: No Stress Concern Present (12/08/2021)   [place]    Feeling of Stress : Only a little  Social Connections: Moderately Integrated (12/08/2021)   Social Connection and Isolation Panel [NHANES]    Frequency of Communication with Friends and Family: Three times a week    Frequency of Social Gatherings with Friends and Family: Three times a week    Attends Religious Services: More than 4 times per year    Active Member of Clubs or Organizations: No    Attends Archivist Meetings: Never    Marital Status: Married     Family History: The patient's family history includes Arthritis in his mother; Breast cancer in his sister; Colon polyps in his father; Diabetes in his father, paternal uncle, and sister; Heart disease in his father and mother; Hypertension in his father and mother; Liver cancer in his maternal grandfather and paternal uncle; Stroke in his mother. There is no history of Colon cancer, Esophageal cancer, Rectal cancer, Stomach cancer, or Pancreatic cancer.  ROS:   All other ROS reviewed and negative. Pertinent positives noted in the HPI.     EKGs/Labs/Other Studies Reviewed:   The following studies were personally reviewed by me  today:  LHC 05/29/2022 Single vessel obstructive CAD Normal LVEDP Successful PCI of the proximal to mid LAD with OCT guidance and DES x 1  Recent Labs: 01/22/2022: TSH 3.25 04/16/2022: ALT 14 05/18/2022: BUN 22; Creatinine, Ser 1.41; Hemoglobin 14.5; Platelets 326; Potassium 5.2; Sodium 139   Recent Lipid Panel    Component Value Date/Time   CHOL 238 (H) 01/22/2022 1049   TRIG 185.0 (H) 01/22/2022 1049   HDL 47.10 01/22/2022 1049   CHOLHDL 5 01/22/2022 1049   VLDL 37.0 01/22/2022 1049   LDLCALC 153 (H) 01/22/2022 1049   LDLDIRECT 169.0 06/24/2018 1408    Physical Exam:   VS:  BP 132/78 (BP Location: Left Arm, Patient Position: Sitting, Cuff Size: Normal)   Pulse 89   Ht '5\' 7"'$  (1.702 m)   Wt 171 lb (77.6 kg)   BMI 26.78 kg/m    Wt Readings from Last 3 Encounters:  07/19/22 171 lb (77.6 kg)  06/13/22 169 lb 12.8 oz (77 kg)  05/29/22 160 lb (72.6 kg)    General: Well nourished, well developed, in no acute distress Head: Atraumatic, normal size  Eyes: PEERLA, EOMI  Neck: Supple, no JVD Endocrine: No thryomegaly Cardiac: Normal S1, S2; RRR; no murmurs, rubs, or gallops Lungs: Clear to auscultation bilaterally, no wheezing, rhonchi or rales  Abd: Soft, nontender, no hepatomegaly  Ext: No edema, pulses 2+ Musculoskeletal: No deformities, BUE and BLE strength normal and equal Skin: Warm and dry, no rashes   Neuro: Alert and oriented to person, place, time, and situation, CNII-XII grossly intact, no focal deficits  Psych: Normal mood and affect   ASSESSMENT:   Erik Lolling Sr. is a 76 y.o. male who presents for the following: 1. Coronary artery disease of native artery of native heart with stable angina pectoris ([place])   2. Pure hypercholesterolemia     PLAN:   1. Coronary artery disease of native artery of native heart with stable angina pectoris ([place]) 2. Pure hypercholesterolemia -PCI to the proximal LAD in July.  He has had issues with Brilinta.  He continues to have  issues with nausea, shortness of breath and fatigue.  Symptoms are lessened on Plavix.  He will be continue aspirin Plavix for at least 6 months.  Given history of GI bleed I want him to remain on Protonix.  Unclear what is causing most of his symptoms.  He is on amitriptyline as well as Ativan.  These could be contributing.  For now we will just continue with this.  He is not having any symptoms of angina.  His echo is normal.  We did discuss that he needs to be on a cholesterol-lowering medication.  He is not wanting to try anything new until he is off Plavix.  We will get him considered for PCSK9 inhibitor therapy given his issues with statins.      Disposition: Return in about 4 months (around 11/18/2022).  Medication Adjustments/Labs and Tests Ordered: Current medicines are reviewed at length with the patient today.  Concerns regarding medicines are outlined above.  No orders of the defined types were placed in this encounter.  No orders of the defined types were placed in this encounter.   Patient Instructions  Medication Instructions:  The current medical regimen is effective;  continue present plan and medications.  *If you need a refill on your cardiac medications before your next appointment, please call your pharmacy*   Follow-Up: At [facility], you and your health needs are our priority.  As part of our continuing mission to provide you with exceptional heart care, we have created designated Provider Care Teams.  These Care Teams include your primary Cardiologist (physician) and Advanced Practice Providers (APPs -  Physician Assistants and Nurse Practitioners) who all work together to provide you with the care you need, when you need it.  We recommend signing up for the patient portal called "MyChart".  Sign up information is provided on this After Visit Summary.  MyChart is used to connect with patients for Virtual Visits (Telemedicine).  Patients are able to view  lab/test results, encounter notes, upcoming appointments, etc.  Non-urgent messages can be sent to your provider as well.   To learn more about what you can do with MyChart, go to [email or web address].    Your next appointment:   4 month(s)  The format for your next appointment:   In Person  Provider:   Evalina Field, MD           Time Spent with Patient: I have spent a total of 25 minutes with patient reviewing hospital notes, telemetry, EKGs, labs and examining the patient as well as establishing an assessment and plan that was discussed with the patient.  > 50% of time was spent in direct patient care.  Signed, Addison Naegeli. Audie Box, MD, [place]  [address] [place], [place] [address] [phone]  07/19/2022 3:04 PM

## 2022-07-19 ENCOUNTER — Encounter: Payer: Self-pay | Admitting: Cardiovascular Disease

## 2022-07-19 ENCOUNTER — Ambulatory Visit: Payer: Medicare Other | Attending: Cardiovascular Disease | Admitting: Cardiovascular Disease

## 2022-07-19 VITALS — BP 132/78 | HR 89 | Ht 67.0 in | Wt 171.0 lb

## 2022-07-19 DIAGNOSIS — E7800 Pure hypercholesterolemia, unspecified: Secondary | ICD-10-CM

## 2022-07-19 DIAGNOSIS — I25118 Atherosclerotic heart disease of native coronary artery with other forms of angina pectoris: Secondary | ICD-10-CM | POA: Diagnosis not present

## 2022-07-19 NOTE — Patient Instructions (Signed)
Medication Instructions:  The current medical regimen is effective;  continue present plan and medications.  *If you need a refill on your cardiac medications before your next appointment, please call your pharmacy*   Follow-Up: At [facility], you and your health needs are our priority.  As part of our continuing mission to provide you with exceptional heart care, we have created designated Provider Care Teams.  These Care Teams include your primary Cardiologist (physician) and Advanced Practice Providers (APPs -  Physician Assistants and Nurse Practitioners) who all work together to provide you with the care you need, when you need it.  We recommend signing up for the patient portal called "MyChart".  Sign up information is provided on this After Visit Summary.  MyChart is used to connect with patients for Virtual Visits (Telemedicine).  Patients are able to view lab/test results, encounter notes, upcoming appointments, etc.  Non-urgent messages can be sent to your provider as well.   To learn more about what you can do with MyChart, go to [email or web address].    Your next appointment:   4 month(s)  The format for your next appointment:   In Person  Provider:   Evalina Field, MD

## 2022-07-23 ENCOUNTER — Encounter: Payer: Self-pay | Admitting: Internal Medicine

## 2022-07-23 ENCOUNTER — Encounter: Payer: Self-pay | Admitting: Cardiovascular Disease

## 2022-07-23 DIAGNOSIS — Z955 Presence of coronary angioplasty implant and graft: Secondary | ICD-10-CM | POA: Diagnosis not present

## 2022-07-23 DIAGNOSIS — Z932 Ileostomy status: Secondary | ICD-10-CM | POA: Diagnosis not present

## 2022-07-23 DIAGNOSIS — Z5189 Encounter for other specified aftercare: Secondary | ICD-10-CM | POA: Diagnosis not present

## 2022-07-24 DIAGNOSIS — K120 Recurrent oral aphthae: Secondary | ICD-10-CM | POA: Diagnosis not present

## 2022-07-24 DIAGNOSIS — J392 Other diseases of pharynx: Secondary | ICD-10-CM | POA: Diagnosis not present

## 2022-07-25 ENCOUNTER — Ambulatory Visit: Payer: Medicare Other | Admitting: Cardiovascular Disease

## 2022-07-25 DIAGNOSIS — Z955 Presence of coronary angioplasty implant and graft: Secondary | ICD-10-CM | POA: Diagnosis not present

## 2022-07-25 DIAGNOSIS — Z5189 Encounter for other specified aftercare: Secondary | ICD-10-CM | POA: Diagnosis not present

## 2022-07-26 ENCOUNTER — Ambulatory Visit (INDEPENDENT_AMBULATORY_CARE_PROVIDER_SITE_OTHER): Payer: Medicare Other | Admitting: Internal Medicine

## 2022-07-26 ENCOUNTER — Encounter: Payer: Self-pay | Admitting: Pharmacist

## 2022-07-26 VITALS — BP 120/72 | HR 75 | Temp 97.8°F | Ht 67.0 in | Wt 169.0 lb

## 2022-07-26 DIAGNOSIS — F32A Depression, unspecified: Secondary | ICD-10-CM | POA: Diagnosis not present

## 2022-07-26 DIAGNOSIS — F419 Anxiety disorder, unspecified: Secondary | ICD-10-CM | POA: Diagnosis not present

## 2022-07-26 DIAGNOSIS — F5101 Primary insomnia: Secondary | ICD-10-CM | POA: Diagnosis not present

## 2022-07-26 DIAGNOSIS — I25118 Atherosclerotic heart disease of native coronary artery with other forms of angina pectoris: Secondary | ICD-10-CM | POA: Diagnosis not present

## 2022-07-26 DIAGNOSIS — E559 Vitamin D deficiency, unspecified: Secondary | ICD-10-CM | POA: Diagnosis not present

## 2022-07-26 DIAGNOSIS — R1030 Lower abdominal pain, unspecified: Secondary | ICD-10-CM

## 2022-07-26 MED ORDER — GABAPENTIN 300 MG PO CAPS: ORAL_CAPSULE | ORAL | 1 refills | Status: DC

## 2022-07-26 MED ORDER — LORAZEPAM 0.5 MG PO TABS: 0.2500 mg | ORAL_TABLET | Freq: Two times a day (BID) | ORAL | 2 refills | Status: DC | PRN

## 2022-07-26 NOTE — Progress Notes (Unsigned)
Patient ID: Erik Lolling Sr., male   DOB: 11-Dec-1945, 76 y.o.   MRN: [id number]        Chief Complaint: follow up lower abd burning pain, CAD, chronic insomnia, anxiety       HPI:  Erik PREWITT Sr. is a 76 y.o. male here to f/u, mentions 1 wk of diffuse lower abd type of burning discomfort, mild, intermittent, where nothing seems to make better or worse, and not associated with worsening fever, chills, n/v, blood or bowel change.  Does have urostomy with clear urine.  Last CT feb 2023 without obstruction. Pt denies chest pain, increased sob or doe, wheezing, orthopnea, PND, increased LE swelling, palpitations, dizziness or syncope.  Does c/o again chronic insomnia where several medications have tended to help to start, then not worked after a while.  Willing to try gabapentin again.  Denies worsening depressive symptoms, suicidal ideation, or panic; has ongoing anxiety       Wt Readings from Last 3 Encounters:  07/26/22 169 lb (76.7 kg)  07/19/22 171 lb (77.6 kg)  06/13/22 169 lb 12.8 oz (77 kg)   BP Readings from Last 3 Encounters:  07/26/22 120/72  07/19/22 132/78  06/13/22 120/68         Past Medical History:  Diagnosis Date   Allergic rhinitis    Allergy    Anal fissure    Anemia    Anxiety    Blood transfusion without reported diagnosis    Cataract    starting   Coronary atherosclerosis of native coronary artery 2007   nonobstructive CAD by cath with 40% mid-LAD stenosis   Degenerative arthritis of knee, bilateral 10/09/2017   Depression    Diverticulosis 12/18/2011   Diverticulosis of colon with hemorrhage    April 2021, [facility]   Duodenal mass    Fibromyalgia    GERD with stricture    Helicobacter pylori (H. 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   Vitamin D deficiency 11/29/2017   Past Surgical History:  Procedure Laterality Date   bladder distention     x 6   CARDIAC CATHETERIZATION     CATHETER REMOVAL     super pubic area   COLONOSCOPY     CORONARY STENT INTERVENTION N/A 05/29/2022   Procedure: CORONARY STENT INTERVENTION;  Surgeon: Martinique, Peter M, MD;  Location: [facility];  Service: Cardiovascular;  Laterality: N/A;   DENTAL SURGERY Right    #30 tooth extraction (right upper)   INTERSTIM IMPLANT PLACEMENT     INTERSTIM IMPLANT REMOVAL     KNEE ARTHROSCOPY     right x 2   LEFT HEART CATH AND CORONARY ANGIOGRAPHY N/A 05/29/2022   Procedure: LEFT HEART CATH AND CORONARY ANGIOGRAPHY;  Surgeon: Martinique, Peter M, MD;  Location: [facility];  Service: Cardiovascular;  Laterality: N/A;   NISSEN FUNDOPLICATION  4332  PROSTECTOMY     ROBOT ASSISTED LAPAROSCOPIC COMPLETE CYSTECT ILEAL CONDUIT     SHOULDER ARTHROSCOPY  2010   left   TONSILLECTOMY AND ADENOIDECTOMY  1957   TOTAL KNEE ARTHROPLASTY Right 09/08/2021   Procedure: TOTAL KNEE ARTHROPLASTY;  Surgeon: Susa Day, MD;  Location: [facility];  Service: Orthopedics;  Laterality: Right;   TRANSURETHRAL RESECTION OF PROSTATE     x 2   UPPER GASTROINTESTINAL ENDOSCOPY  05/13/2008   hiatal hernia   VASECTOMY      reports that he has quit smoking. He has never used smokeless tobacco. He reports that he does not drink alcohol and does not use drugs. family history includes Arthritis in his mother; Breast cancer in his sister; Colon polyps in his father; Diabetes in his father, paternal uncle, and sister; Heart disease in his father and mother; Hypertension in his  father and mother; Liver cancer in his maternal grandfather and paternal uncle; Stroke in his mother. Allergies  Allergen Reactions   Ambien [Zolpidem]     Unknown reaction   Bentyl [Dicyclomine]     Memory loss, anxiety, blurred vision   Carafate [Sucralfate] Nausea And Vomiting   Lansoprazole Diarrhea   Lexapro [Escitalopram Oxalate]     Unknown reaction    Other     Pt states he is sensitive to all oral medications   Pneumovax [Pneumococcal Polysaccharide Vaccine] Other (See Comments)    pain   Prilosec [Omeprazole] Other (See Comments)    Per patient joint pain with PPI's   Remeron [Mirtazapine]     Unknown reaction    Seroquel [Quetiapine] Other (See Comments)    Unknown reaction    Statins Other (See Comments)    Joint pain   Tizanidine Other (See Comments)    Made patient "feel crazy"   Tramadol Other (See Comments)    Pt can take small amounts up to twice daily but continued use causes chest / abdominal pain   Tylenol [Acetaminophen] Nausea And Vomiting    Can only take for 2 takes then he starts to have pain   Current Outpatient Medications on File Prior to Visit  Medication Sig Dispense Refill   amitriptyline (ELAVIL) 25 MG tablet Take 25 mg by mouth at bedtime.     aspirin EC 81 MG tablet Take 1 tablet (81 mg total) by mouth daily. Swallow whole. 90 tablet 3   Camphor-Eucalyptus-Menthol (VICKS VAPORUB EX) Apply 1 Application topically daily as needed (congestion).     Cholecalciferol (DIALYVITE VITAMIN D 5000) 125 MCG (5000 UT) capsule Take 1,000 Units by mouth daily.     clopidogrel (PLAVIX) 75 MG tablet Take 1 tablet (75 mg total) by mouth daily. 90 tablet 3   diclofenac Sodium (VOLTAREN) 1 % GEL Apply 1 Application topically 4 (four) times daily as needed (pain).     diphenoxylate-atropine (LOMOTIL) 2.5-0.025 MG tablet Take 1 tablet by mouth 4 (four) times daily as needed for diarrhea or loose stools.     nitroGLYCERIN (NITROSTAT) 0.4 MG SL tablet Place 1 tablet  (0.4 mg total) under the tongue every 5 (five) minutes as needed for chest pain. 25 tablet 3   pantoprazole (PROTONIX) 40 MG tablet Take 1 tablet (40 mg total) by mouth daily. 90 tablet 3   traMADol (ULTRAM) 50 MG tablet TAKE 1 TABLET BY MOUTH TWICE A DAY AS NEEDED 120 tablet 2   No current facility-administered medications on file prior to visit.        ROS:  All  others reviewed and negative.  Objective        PE:  BP 120/72 (BP Location: Right Arm, Patient Position: Sitting, Cuff Size: Large)   Pulse 75   Temp 97.8 F (36.6 C) (Oral)   Ht '5\' 7"'$  (1.702 m)   Wt 169 lb (76.7 kg)   SpO2 93%   BMI 26.47 kg/m                 Constitutional: Pt appears in NAD               HENT: Head: NCAT.                Right Ear: External ear normal.                 Left Ear: External ear normal.                Eyes: . Pupils are equal, round, and reactive to light. Conjunctivae and EOM are normal               Nose: without d/c or deformity               Neck: Neck supple. Gross normal ROM               Cardiovascular: Normal rate and regular rhythm.                 Pulmonary/Chest: Effort normal and breath sounds without rales or wheezing.                Abd:  Soft, NT, ND, + BS, no organomegaly               Neurological: Pt is alert. At baseline orientation, motor grossly intact               Skin: Skin is warm. No rashes, no other new lesions, LE edema - none               Psychiatric: Pt behavior is normal without agitation , chornic depressed affect  Micro: none  Cardiac tracings I have personally interpreted today:  none  Pertinent Radiological findings (summarize): none   Lab Results  Component Value Date   WBC 7.6 05/18/2022   HGB 14.5 05/18/2022   HCT 43.2 05/18/2022   PLT 326 05/18/2022   GLUCOSE 68 (L) 05/18/2022   CHOL 238 (H) 01/22/2022   TRIG 185.0 (H) 01/22/2022   HDL 47.10 01/22/2022   LDLDIRECT 169.0 06/24/2018   LDLCALC 153 (H) 01/22/2022   ALT 14 04/16/2022    AST 20 04/16/2022   NA 139 05/18/2022   K 5.2 05/18/2022   CL 103 05/18/2022   CREATININE 1.41 (H) 05/18/2022   BUN 22 05/18/2022   CO2 24 05/18/2022   TSH 3.25 01/22/2022   PSA 0.00 (L) 01/22/2022   HGBA1C 6.3 01/22/2022   Assessment/Plan:  Erik Lolling Sr. is a 76 y.o. White or Caucasian [1] male with  has a past medical history of Allergic rhinitis, Allergy, Anal fissure, Anemia, Anxiety, Blood transfusion without reported diagnosis, Cataract, Coronary atherosclerosis of native coronary artery (2007), Degenerative arthritis of knee, bilateral (10/09/2017), Depression, Diverticulosis (12/18/2011), Diverticulosis of colon with hemorrhage, Duodenal mass, Fibromyalgia, GERD with stricture, Helicobacter pylori (H. pylori) infection (11/25/2012), Hiatal hernia, HLD (hyperlipidemia), HTN (hypertension), Hyperlipidemia, IBS (irritable bowel syndrome), Impotence of organic origin, Insomnia, Internal and external hemorrhoids without complication, Interstitial cystitis, Irritable bowel syndrome (06/09/2010), Osteoporosis, Pelvic floor dysfunction (11/29/2017),  Pneumonia, Reactive hypoglycemia, Rheumatoid arthritis(714.0), Somatization disorder (02/27/2012), and Vitamin D deficiency (11/29/2017).  Abdominal pain, lower Chronic recurrent, exam benign, urine clear, afeb, non toxic appearing, declines further labs today,  to f/u any worsening symptoms or concerns  Anxiety Overall stable it seems without panic, but for lorazepam prn  Insomnia With recent worsening, ok for gabapentin 300 - 600 mg qhs prn  Vitamin D deficiency Last vitamin D Lab Results  Component Value Date   VD25OH 48.90 01/22/2022   Stable, cont oral replacement   CAD (coronary artery disease) Stable overall, to start cardiac rehab very soon  Depression Overall chronic persistent stable, no SI or HI, declines further referral or change in tx  Followup: Return in about 6 months (around 01/24/2023).  Cathlean Cower, MD  07/29/2022 7:06 PM [facility]

## 2022-07-26 NOTE — Progress Notes (Signed)
[facility])                                            [place] Team                                        Statin Quality Measure Assessment    07/26/2022  [name] Sr. Feb 05, 1946 [id number]   I am a [facility] clinical pharmacist that reviews patients for statin quality initiatives.     Per review of chart and payor information, patient has a diagnosis of cardiovascular disease but is not currently filling a statin prescription.  This places patient into the [facility] (Statin Use In Patients with Cardiovascular Disease) measure for CMS.    Patient has documented trials of statins with reported joint/muscle pain, but no corresponding CPT codes that would exclude patient from [facility] measure.     Component Value Date/Time   CHOL 238 (H) 01/22/2022 1049   TRIG 185.0 (H) 01/22/2022 1049   HDL 47.10 01/22/2022 1049   CHOLHDL 5 01/22/2022 1049   VLDL 37.0 01/22/2022 1049   LDLCALC 153 (H) 01/22/2022 1049   LDLDIRECT 169.0 06/24/2018 1408     Please consider ONE of the following recommendations:  Initiate high intensity statin Atorvastatin '40mg'$  once daily, #90, 3 refills   Rosuvastatin '20mg'$  once daily, #90, 3 refills    Initiate moderate intensity  statin with reduced frequency if prior  statin intolerance 1x weekly, #13, 3 refills   2x weekly, #26, 3 refills   3x weekly, #39, 3 refills    Code for past statin intolerance  (required annually)  Provider Requirements: Must asociate code during an office visit or telehealth encounter   Drug Induced Myopathy G72.0   Myalgia M79.1   Myositis, unspecified M60.9   Myopathy, unspecified G72.9   Rhabdomyolysis M62.82     Thank you!  Curlene Labrum, PharmD [place] Pharmacist Office: [phone]

## 2022-07-26 NOTE — Patient Instructions (Signed)
Please take all new medication as prescribed - the gabapentin  Please continue all other medications as before, and refills have been done if requested - lorazepam  Please have the pharmacy call with any other refills you may need.  Please continue your efforts at being more active, low cholesterol diet, and weight control.  Please keep your appointments with your specialists as you may have planned  Please make an Appointment to return in 6 months, or sooner if needed

## 2022-07-29 ENCOUNTER — Encounter: Payer: Self-pay | Admitting: Internal Medicine

## 2022-07-29 NOTE — Assessment & Plan Note (Signed)
With recent worsening, ok for gabapentin 300 - 600 mg qhs prn

## 2022-07-29 NOTE — Assessment & Plan Note (Signed)
Stable overall, to start cardiac rehab very soon

## 2022-07-29 NOTE — Assessment & Plan Note (Signed)
Last vitamin D Lab Results  Component Value Date   VD25OH 48.90 01/22/2022   Stable, cont oral replacement  

## 2022-07-29 NOTE — Assessment & Plan Note (Signed)
Overall chronic persistent stable, no SI or HI, declines further referral or change in tx

## 2022-07-29 NOTE — Assessment & Plan Note (Signed)
Overall stable it seems without panic, but for lorazepam prn

## 2022-07-29 NOTE — Assessment & Plan Note (Signed)
Chronic recurrent, exam benign, urine clear, afeb, non toxic appearing, declines further labs today,  to f/u any worsening symptoms or concerns

## 2022-07-30 DIAGNOSIS — Z5189 Encounter for other specified aftercare: Secondary | ICD-10-CM | POA: Diagnosis not present

## 2022-07-30 DIAGNOSIS — Z955 Presence of coronary angioplasty implant and graft: Secondary | ICD-10-CM | POA: Diagnosis not present

## 2022-08-01 ENCOUNTER — Telehealth: Payer: Self-pay | Admitting: Internal Medicine

## 2022-08-01 DIAGNOSIS — Z5189 Encounter for other specified aftercare: Secondary | ICD-10-CM | POA: Diagnosis not present

## 2022-08-01 DIAGNOSIS — R739 Hyperglycemia, unspecified: Secondary | ICD-10-CM

## 2022-08-01 DIAGNOSIS — Z955 Presence of coronary angioplasty implant and graft: Secondary | ICD-10-CM | POA: Diagnosis not present

## 2022-08-01 NOTE — Telephone Encounter (Signed)
Phebe Colla, a dietician with the Cardio Rehab department at [facility], called for a referral for diabetes education, preferably in [place]. A provider she had in mind was [employer].     Good callback number for Claiborne Billings is [phone] Okay for voicemails on that number as well    Please advise

## 2022-08-01 NOTE — Telephone Encounter (Signed)
Ok done

## 2022-08-02 DIAGNOSIS — Z5189 Encounter for other specified aftercare: Secondary | ICD-10-CM | POA: Diagnosis not present

## 2022-08-02 DIAGNOSIS — Z955 Presence of coronary angioplasty implant and graft: Secondary | ICD-10-CM | POA: Diagnosis not present

## 2022-08-06 ENCOUNTER — Telehealth: Payer: Self-pay | Admitting: Internal Medicine

## 2022-08-06 ENCOUNTER — Encounter: Payer: Self-pay | Admitting: Internal Medicine

## 2022-08-06 DIAGNOSIS — Z5189 Encounter for other specified aftercare: Secondary | ICD-10-CM | POA: Diagnosis not present

## 2022-08-06 DIAGNOSIS — Z955 Presence of coronary angioplasty implant and graft: Secondary | ICD-10-CM | POA: Diagnosis not present

## 2022-08-06 DIAGNOSIS — R7303 Prediabetes: Secondary | ICD-10-CM

## 2022-08-06 NOTE — Telephone Encounter (Signed)
Phebe Colla from the dietary diabetic center was returning a call to review some of the pt's info.   Please call Claiborne Billings at: [phone]

## 2022-08-07 NOTE — Telephone Encounter (Signed)
Ok this is done 

## 2022-08-07 NOTE — Telephone Encounter (Signed)
Spoke with Claiborne Billings and she is requesting a diabetes outpatient education referral at [facility] with Dr.Dianna Malachi Carl

## 2022-08-07 NOTE — Telephone Encounter (Signed)
Left message for a call back from [place].

## 2022-08-08 DIAGNOSIS — Z5189 Encounter for other specified aftercare: Secondary | ICD-10-CM | POA: Diagnosis not present

## 2022-08-08 DIAGNOSIS — Z955 Presence of coronary angioplasty implant and graft: Secondary | ICD-10-CM | POA: Diagnosis not present

## 2022-08-13 ENCOUNTER — Encounter: Payer: Self-pay | Admitting: Cardiovascular Disease

## 2022-08-13 DIAGNOSIS — Z955 Presence of coronary angioplasty implant and graft: Secondary | ICD-10-CM | POA: Diagnosis not present

## 2022-08-13 DIAGNOSIS — Z5189 Encounter for other specified aftercare: Secondary | ICD-10-CM | POA: Diagnosis not present

## 2022-08-14 DIAGNOSIS — R5383 Other fatigue: Secondary | ICD-10-CM | POA: Diagnosis not present

## 2022-08-14 DIAGNOSIS — R194 Change in bowel habit: Secondary | ICD-10-CM | POA: Diagnosis not present

## 2022-08-14 DIAGNOSIS — R631 Polydipsia: Secondary | ICD-10-CM | POA: Diagnosis not present

## 2022-08-14 DIAGNOSIS — F5101 Primary insomnia: Secondary | ICD-10-CM | POA: Diagnosis not present

## 2022-08-14 DIAGNOSIS — G8929 Other chronic pain: Secondary | ICD-10-CM | POA: Diagnosis not present

## 2022-08-14 DIAGNOSIS — R110 Nausea: Secondary | ICD-10-CM | POA: Diagnosis not present

## 2022-08-15 DIAGNOSIS — Z5189 Encounter for other specified aftercare: Secondary | ICD-10-CM | POA: Diagnosis not present

## 2022-08-15 DIAGNOSIS — Z932 Ileostomy status: Secondary | ICD-10-CM | POA: Diagnosis not present

## 2022-08-15 DIAGNOSIS — Z955 Presence of coronary angioplasty implant and graft: Secondary | ICD-10-CM | POA: Diagnosis not present

## 2022-08-16 ENCOUNTER — Telehealth: Payer: Self-pay | Admitting: Internal Medicine

## 2022-08-16 DIAGNOSIS — Z955 Presence of coronary angioplasty implant and graft: Secondary | ICD-10-CM | POA: Diagnosis not present

## 2022-08-16 DIAGNOSIS — R109 Unspecified abdominal pain: Secondary | ICD-10-CM | POA: Diagnosis not present

## 2022-08-16 DIAGNOSIS — Z5189 Encounter for other specified aftercare: Secondary | ICD-10-CM | POA: Diagnosis not present

## 2022-08-16 DIAGNOSIS — R194 Change in bowel habit: Secondary | ICD-10-CM | POA: Diagnosis not present

## 2022-08-16 NOTE — Telephone Encounter (Signed)
[name] called from [place] to report their findings during his rehab evaluation. Pt is showing signs of anxiety and depression and scored a 12 and a 15 on PHQR. Pt is not sleeping well and having anxiety attacks at night. Pt has a history of PTSD and has been referred to a Education officer, museum through [place].   For any questions please call [name]:  [phone]

## 2022-08-20 DIAGNOSIS — Z5189 Encounter for other specified aftercare: Secondary | ICD-10-CM | POA: Diagnosis not present

## 2022-08-20 DIAGNOSIS — Z955 Presence of coronary angioplasty implant and graft: Secondary | ICD-10-CM | POA: Diagnosis not present

## 2022-08-22 DIAGNOSIS — Z5189 Encounter for other specified aftercare: Secondary | ICD-10-CM | POA: Diagnosis not present

## 2022-08-22 DIAGNOSIS — Z955 Presence of coronary angioplasty implant and graft: Secondary | ICD-10-CM | POA: Diagnosis not present

## 2022-08-23 ENCOUNTER — Encounter: Payer: Self-pay | Admitting: Internal Medicine

## 2022-08-28 ENCOUNTER — Ambulatory Visit: Payer: Self-pay | Admitting: Licensed Clinical Social Worker

## 2022-08-28 DIAGNOSIS — R197 Diarrhea, unspecified: Secondary | ICD-10-CM | POA: Diagnosis not present

## 2022-08-28 DIAGNOSIS — R768 Other specified abnormal immunological findings in serum: Secondary | ICD-10-CM | POA: Diagnosis not present

## 2022-08-28 NOTE — Patient Instructions (Signed)
Visit Information  Thank you for taking time to visit with me today. Please don't hesitate to contact me if I can be of assistance to you before our next scheduled telephone appointment.  Following are the goals we discussed today:   Our next appointment is by telephone on 09/11/22 at 9:30 AM  Please call the care guide team at [phone] if you need to cancel or reschedule your appointment.   If you are experiencing a Mental Health or [place] or need someone to talk to, please go to [facility] [place] [phone])   Following is a copy of your full plan of care:   Interventions: Informed client about Care Coordination program support Talked with client about client support with PCP Discussed client history of heart procedure in July of 2023. Described program support with RN and pharmacists Client agreed for LCSW to call client in 3 weeks to discuss client needs Provided counseling support with client  [name] was given information about Care Management services by the embedded care coordination team including:  Care Management services include personalized support from designated clinical staff supervised by his physician, including individualized plan of care and coordination with other care providers 24/7 contact phone numbers for assistance for urgent and routine care needs. The patient may stop CCM services at any time (effective at the end of the month) by phone call to the office staff.  Patient agreed to services and verbal consent obtained.   [name].Shanedra Lave MSW, [place] representative [facility] Care Management [phone]

## 2022-08-28 NOTE — Patient Outreach (Signed)
  Care Coordination   Initial Visit Note   08/28/2022 Name: Erik SCHAMP Sr. MRN: [id number] DOB: [birth year]  Erik Lolling Sr. is a 76 y.o. year old male who sees [name], MD for primary care. I spoke with  Erik Lolling Sr. by phone today.  What matters to the patients health and wellness today? Client is interested in program services. He said he would like LCSW to call him back in 3 weeks to further discuss his current needs.     Goals Addressed               This Visit's Progress     Patient is interested in support with program.  He said he is seeing specialist (GI specialist today) and would be interested in call back in 3 weeks from LCSW (pt-stated)        Interventions: Informed client about Care Coordination program support Talked with client about client support with PCP Discussed client history of heart procedure in July of 2023. Described program support with RN and pharmacists Client agreed for LCSW to call client in 3 weeks to discuss client needs Provided counseling support with client     SDOH assessments and interventions completed:  Yes  SDOH Interventions Today    Flowsheet Row Most Recent Value  SDOH Interventions   Depression Interventions/Treatment  Counseling  Physical Activity Interventions Other (Comments)  Stress Interventions Provide Counseling        Care Coordination Interventions Activated:  Yes  Care Coordination Interventions:  Yes, provided   Follow up plan: Follow up call scheduled for 09/11/22 at 9:30 AM    Encounter Outcome:  Pt. Visit Completed

## 2022-09-01 DIAGNOSIS — Z7982 Long term (current) use of aspirin: Secondary | ICD-10-CM | POA: Diagnosis not present

## 2022-09-01 DIAGNOSIS — R6889 Other general symptoms and signs: Secondary | ICD-10-CM | POA: Diagnosis not present

## 2022-09-01 DIAGNOSIS — K922 Gastrointestinal hemorrhage, unspecified: Secondary | ICD-10-CM | POA: Diagnosis not present

## 2022-09-01 DIAGNOSIS — Z955 Presence of coronary angioplasty implant and graft: Secondary | ICD-10-CM | POA: Diagnosis not present

## 2022-09-01 DIAGNOSIS — I2510 Atherosclerotic heart disease of native coronary artery without angina pectoris: Secondary | ICD-10-CM | POA: Diagnosis not present

## 2022-09-01 DIAGNOSIS — Z7902 Long term (current) use of antithrombotics/antiplatelets: Secondary | ICD-10-CM | POA: Diagnosis not present

## 2022-09-01 DIAGNOSIS — M797 Fibromyalgia: Secondary | ICD-10-CM | POA: Diagnosis not present

## 2022-09-01 DIAGNOSIS — Z743 Need for continuous supervision: Secondary | ICD-10-CM | POA: Diagnosis not present

## 2022-09-01 DIAGNOSIS — R58 Hemorrhage, not elsewhere classified: Secondary | ICD-10-CM | POA: Diagnosis not present

## 2022-09-01 DIAGNOSIS — R0789 Other chest pain: Secondary | ICD-10-CM | POA: Diagnosis not present

## 2022-09-02 DIAGNOSIS — R58 Hemorrhage, not elsewhere classified: Secondary | ICD-10-CM | POA: Diagnosis not present

## 2022-09-02 DIAGNOSIS — Z932 Ileostomy status: Secondary | ICD-10-CM | POA: Diagnosis not present

## 2022-09-02 DIAGNOSIS — K5791 Diverticulosis of intestine, part unspecified, without perforation or abscess with bleeding: Secondary | ICD-10-CM | POA: Diagnosis not present

## 2022-09-02 DIAGNOSIS — Z906 Acquired absence of other parts of urinary tract: Secondary | ICD-10-CM | POA: Diagnosis not present

## 2022-09-02 DIAGNOSIS — Z7902 Long term (current) use of antithrombotics/antiplatelets: Secondary | ICD-10-CM | POA: Diagnosis not present

## 2022-09-02 DIAGNOSIS — Z955 Presence of coronary angioplasty implant and graft: Secondary | ICD-10-CM | POA: Diagnosis not present

## 2022-09-02 DIAGNOSIS — K922 Gastrointestinal hemorrhage, unspecified: Secondary | ICD-10-CM | POA: Diagnosis not present

## 2022-09-02 DIAGNOSIS — K5731 Diverticulosis of large intestine without perforation or abscess with bleeding: Secondary | ICD-10-CM | POA: Diagnosis not present

## 2022-09-02 DIAGNOSIS — Z66 Do not resuscitate: Secondary | ICD-10-CM | POA: Diagnosis not present

## 2022-09-02 DIAGNOSIS — D62 Acute posthemorrhagic anemia: Secondary | ICD-10-CM | POA: Diagnosis not present

## 2022-09-02 DIAGNOSIS — Z01810 Encounter for preprocedural cardiovascular examination: Secondary | ICD-10-CM | POA: Diagnosis not present

## 2022-09-02 DIAGNOSIS — Z887 Allergy status to serum and vaccine status: Secondary | ICD-10-CM | POA: Diagnosis not present

## 2022-09-02 DIAGNOSIS — K580 Irritable bowel syndrome with diarrhea: Secondary | ICD-10-CM | POA: Diagnosis not present

## 2022-09-02 DIAGNOSIS — Z7982 Long term (current) use of aspirin: Secondary | ICD-10-CM | POA: Diagnosis not present

## 2022-09-02 DIAGNOSIS — K219 Gastro-esophageal reflux disease without esophagitis: Secondary | ICD-10-CM | POA: Diagnosis not present

## 2022-09-02 DIAGNOSIS — K640 First degree hemorrhoids: Secondary | ICD-10-CM | POA: Diagnosis not present

## 2022-09-02 DIAGNOSIS — M069 Rheumatoid arthritis, unspecified: Secondary | ICD-10-CM | POA: Diagnosis not present

## 2022-09-02 DIAGNOSIS — K5730 Diverticulosis of large intestine without perforation or abscess without bleeding: Secondary | ICD-10-CM | POA: Diagnosis not present

## 2022-09-02 DIAGNOSIS — K648 Other hemorrhoids: Secondary | ICD-10-CM | POA: Diagnosis not present

## 2022-09-02 DIAGNOSIS — M9905 Segmental and somatic dysfunction of pelvic region: Secondary | ICD-10-CM | POA: Diagnosis not present

## 2022-09-02 DIAGNOSIS — K5781 Diverticulitis of intestine, part unspecified, with perforation and abscess with bleeding: Secondary | ICD-10-CM | POA: Diagnosis not present

## 2022-09-02 DIAGNOSIS — N281 Cyst of kidney, acquired: Secondary | ICD-10-CM | POA: Diagnosis not present

## 2022-09-02 DIAGNOSIS — K5790 Diverticulosis of intestine, part unspecified, without perforation or abscess without bleeding: Secondary | ICD-10-CM | POA: Diagnosis not present

## 2022-09-02 DIAGNOSIS — I2510 Atherosclerotic heart disease of native coronary artery without angina pectoris: Secondary | ICD-10-CM | POA: Diagnosis not present

## 2022-09-02 DIAGNOSIS — K319 Disease of stomach and duodenum, unspecified: Secondary | ICD-10-CM | POA: Diagnosis not present

## 2022-09-02 DIAGNOSIS — Z79899 Other long term (current) drug therapy: Secondary | ICD-10-CM | POA: Diagnosis not present

## 2022-09-02 DIAGNOSIS — M797 Fibromyalgia: Secondary | ICD-10-CM | POA: Diagnosis not present

## 2022-09-02 DIAGNOSIS — E785 Hyperlipidemia, unspecified: Secondary | ICD-10-CM | POA: Diagnosis not present

## 2022-09-02 DIAGNOSIS — Z888 Allergy status to other drugs, medicaments and biological substances status: Secondary | ICD-10-CM | POA: Diagnosis not present

## 2022-09-02 DIAGNOSIS — I10 Essential (primary) hypertension: Secondary | ICD-10-CM | POA: Diagnosis not present

## 2022-09-02 DIAGNOSIS — K921 Melena: Secondary | ICD-10-CM | POA: Diagnosis not present

## 2022-09-02 DIAGNOSIS — Z980 Intestinal bypass and anastomosis status: Secondary | ICD-10-CM | POA: Diagnosis not present

## 2022-09-02 DIAGNOSIS — Z743 Need for continuous supervision: Secondary | ICD-10-CM | POA: Diagnosis not present

## 2022-09-02 DIAGNOSIS — Z9889 Other specified postprocedural states: Secondary | ICD-10-CM | POA: Diagnosis not present

## 2022-09-03 DIAGNOSIS — Z932 Ileostomy status: Secondary | ICD-10-CM | POA: Diagnosis not present

## 2022-09-03 DIAGNOSIS — K580 Irritable bowel syndrome with diarrhea: Secondary | ICD-10-CM | POA: Diagnosis not present

## 2022-09-03 DIAGNOSIS — D62 Acute posthemorrhagic anemia: Secondary | ICD-10-CM | POA: Diagnosis not present

## 2022-09-03 DIAGNOSIS — I2510 Atherosclerotic heart disease of native coronary artery without angina pectoris: Secondary | ICD-10-CM | POA: Diagnosis not present

## 2022-09-03 DIAGNOSIS — K219 Gastro-esophageal reflux disease without esophagitis: Secondary | ICD-10-CM | POA: Diagnosis not present

## 2022-09-03 DIAGNOSIS — Z9889 Other specified postprocedural states: Secondary | ICD-10-CM | POA: Diagnosis not present

## 2022-09-03 DIAGNOSIS — Z980 Intestinal bypass and anastomosis status: Secondary | ICD-10-CM | POA: Diagnosis not present

## 2022-09-03 DIAGNOSIS — M9905 Segmental and somatic dysfunction of pelvic region: Secondary | ICD-10-CM | POA: Diagnosis not present

## 2022-09-03 DIAGNOSIS — K922 Gastrointestinal hemorrhage, unspecified: Secondary | ICD-10-CM | POA: Diagnosis not present

## 2022-09-03 DIAGNOSIS — Z7902 Long term (current) use of antithrombotics/antiplatelets: Secondary | ICD-10-CM | POA: Diagnosis not present

## 2022-09-04 DIAGNOSIS — I2510 Atherosclerotic heart disease of native coronary artery without angina pectoris: Secondary | ICD-10-CM | POA: Diagnosis not present

## 2022-09-04 DIAGNOSIS — K640 First degree hemorrhoids: Secondary | ICD-10-CM | POA: Diagnosis not present

## 2022-09-04 DIAGNOSIS — K921 Melena: Secondary | ICD-10-CM | POA: Diagnosis not present

## 2022-09-04 DIAGNOSIS — K5730 Diverticulosis of large intestine without perforation or abscess without bleeding: Secondary | ICD-10-CM | POA: Diagnosis not present

## 2022-09-04 DIAGNOSIS — K922 Gastrointestinal hemorrhage, unspecified: Secondary | ICD-10-CM | POA: Diagnosis not present

## 2022-09-04 DIAGNOSIS — Z79899 Other long term (current) drug therapy: Secondary | ICD-10-CM | POA: Diagnosis not present

## 2022-09-04 DIAGNOSIS — D62 Acute posthemorrhagic anemia: Secondary | ICD-10-CM | POA: Diagnosis not present

## 2022-09-05 DIAGNOSIS — Z79899 Other long term (current) drug therapy: Secondary | ICD-10-CM | POA: Diagnosis not present

## 2022-09-05 DIAGNOSIS — I2510 Atherosclerotic heart disease of native coronary artery without angina pectoris: Secondary | ICD-10-CM | POA: Diagnosis not present

## 2022-09-05 DIAGNOSIS — K922 Gastrointestinal hemorrhage, unspecified: Secondary | ICD-10-CM | POA: Diagnosis not present

## 2022-09-06 DIAGNOSIS — Z7902 Long term (current) use of antithrombotics/antiplatelets: Secondary | ICD-10-CM | POA: Diagnosis not present

## 2022-09-06 DIAGNOSIS — Z932 Ileostomy status: Secondary | ICD-10-CM | POA: Diagnosis not present

## 2022-09-06 DIAGNOSIS — N281 Cyst of kidney, acquired: Secondary | ICD-10-CM | POA: Diagnosis not present

## 2022-09-06 DIAGNOSIS — K922 Gastrointestinal hemorrhage, unspecified: Secondary | ICD-10-CM | POA: Diagnosis not present

## 2022-09-06 DIAGNOSIS — I2510 Atherosclerotic heart disease of native coronary artery without angina pectoris: Secondary | ICD-10-CM | POA: Diagnosis not present

## 2022-09-06 DIAGNOSIS — Z79899 Other long term (current) drug therapy: Secondary | ICD-10-CM | POA: Diagnosis not present

## 2022-09-06 DIAGNOSIS — K5791 Diverticulosis of intestine, part unspecified, without perforation or abscess with bleeding: Secondary | ICD-10-CM | POA: Diagnosis not present

## 2022-09-06 DIAGNOSIS — Z7982 Long term (current) use of aspirin: Secondary | ICD-10-CM | POA: Diagnosis not present

## 2022-09-07 ENCOUNTER — Ambulatory Visit: Payer: Medicare Other | Admitting: General Practice

## 2022-09-07 DIAGNOSIS — K922 Gastrointestinal hemorrhage, unspecified: Secondary | ICD-10-CM | POA: Diagnosis not present

## 2022-09-07 DIAGNOSIS — Z7982 Long term (current) use of aspirin: Secondary | ICD-10-CM | POA: Diagnosis not present

## 2022-09-07 DIAGNOSIS — Z7902 Long term (current) use of antithrombotics/antiplatelets: Secondary | ICD-10-CM | POA: Diagnosis not present

## 2022-09-07 DIAGNOSIS — I2510 Atherosclerotic heart disease of native coronary artery without angina pectoris: Secondary | ICD-10-CM | POA: Diagnosis not present

## 2022-09-07 DIAGNOSIS — Z79899 Other long term (current) drug therapy: Secondary | ICD-10-CM | POA: Diagnosis not present

## 2022-09-07 DIAGNOSIS — Z932 Ileostomy status: Secondary | ICD-10-CM | POA: Diagnosis not present

## 2022-09-08 DIAGNOSIS — K648 Other hemorrhoids: Secondary | ICD-10-CM | POA: Diagnosis not present

## 2022-09-08 DIAGNOSIS — Z932 Ileostomy status: Secondary | ICD-10-CM | POA: Diagnosis not present

## 2022-09-08 DIAGNOSIS — K5781 Diverticulitis of intestine, part unspecified, with perforation and abscess with bleeding: Secondary | ICD-10-CM | POA: Diagnosis not present

## 2022-09-08 DIAGNOSIS — I2510 Atherosclerotic heart disease of native coronary artery without angina pectoris: Secondary | ICD-10-CM | POA: Diagnosis not present

## 2022-09-08 DIAGNOSIS — D62 Acute posthemorrhagic anemia: Secondary | ICD-10-CM | POA: Diagnosis not present

## 2022-09-10 ENCOUNTER — Ambulatory Visit: Payer: Medicare Other | Attending: General Practice | Admitting: Physician Assistant

## 2022-09-10 ENCOUNTER — Encounter: Payer: Self-pay | Admitting: Physician Assistant

## 2022-09-10 VITALS — BP 128/60 | HR 94 | Ht 67.0 in | Wt 168.0 lb

## 2022-09-10 DIAGNOSIS — K922 Gastrointestinal hemorrhage, unspecified: Secondary | ICD-10-CM | POA: Diagnosis not present

## 2022-09-10 DIAGNOSIS — E785 Hyperlipidemia, unspecified: Secondary | ICD-10-CM | POA: Diagnosis not present

## 2022-09-10 DIAGNOSIS — I10 Essential (primary) hypertension: Secondary | ICD-10-CM | POA: Diagnosis not present

## 2022-09-10 DIAGNOSIS — I2510 Atherosclerotic heart disease of native coronary artery without angina pectoris: Secondary | ICD-10-CM | POA: Diagnosis not present

## 2022-09-10 DIAGNOSIS — R002 Palpitations: Secondary | ICD-10-CM

## 2022-09-10 NOTE — Patient Instructions (Signed)
Medication Instructions:  STOP Plavix (Clopidogrel)   *If you need a refill on your cardiac medications before your next appointment, please call your pharmacy*  Lab Work: NONE ordered at this time of appointment   If you have labs (blood work) drawn today and your tests are completely normal, you will receive your results only by: [place] (if you have MyChart) OR A paper copy in the mail If you have any lab test that is abnormal or we need to change your treatment, we will call you to review the results.  Testing/Procedures: NONE ordered at this time of appointment   Follow-Up: At [facility], you and your health needs are our priority.  As part of our continuing mission to provide you with exceptional heart care, we have created designated Provider Care Teams.  These Care Teams include your primary Cardiologist (physician) and Advanced Practice Providers (APPs -  Physician Assistants and Nurse Practitioners) who all work together to provide you with the care you need, when you need it.  Your next appointment:   As previously scheduled    The format for your next appointment:   In Person  Provider:   Evalina Field, MD     Other Instructions  Important Information About [place]

## 2022-09-10 NOTE — Progress Notes (Unsigned)
Cardiology Office Note:    Date:  09/12/2022   ID:  Erik Lolling Sr., DOB [birth year], MRN [id number]  PCP:  [name], MD   [facility] Cardiologist:  Evalina Field, MD     Referring MD: [name], MD   Chief Complaint  Patient presents with   Follow-up   Headache    History of Present Illness:    Erik Lolling Sr. is a 76 y.o. male with a hx of CAD, history of GI bleed, anxiety, anemia, hypertension, hyperlipidemia, somatization disorder and rheumatoid arthritis.  Patient had massive GI bleed at [facility] in 2021.  EGD in March 2023 revealed gastritis.  Cardiac catheterization performed on 05/29/2022 showed 50% ostial RCA, 70% proximal to mid LAD, 90% proximal LAD lesion treated with 3.0 x 20 mm Synergy XD DES, 50% mid left circumflex lesion.  Postprocedure, patient was placed on aspirin and Brilinta with recommendation to continue dual antiplatelet therapy for at least 6 months.  Due to nausea, shortness of breath and fatigue, Brilinta was switched to Plavix.  His symptoms improved slightly on the Plavix.  He was kept on Protonix due to history of GI bleed.  Patient was last seen by Dr. Audie Box on 07/19/2022 at which time he was doing well.  More recently, patient was seen at [facility] ED on 09/01/2020 with lower GI bleed.  CTA revealed active hemorrhage in the terminal ileum anastomosis.  He was subsequently transferred to [facility] for further management.  IR was consulted, patient underwent arteriogram which showed did not reveal bleeding source.  GI was consulted, patient underwent colonoscopy, this revealed the examined portion of ileum was normal, no further signs of GI bleed.  Patient does have nonbleeding internal hemorrhoid and diverticulosis.  Postprocedure, aspirin and Plavix were restarted.  He was given a total of 1 unit of packed red blood cells during hospitalization.  Patient presents today for follow-up.  Patient is quite  frustrated with the recurrent GI bleed.  He attempted to restart aspirin and Plavix last weekend however had a recurrent dark stools.  Since then, he has stopped Plavix and just continued on aspirin 81 mg daily.  As soon as he reduced the medication to just aspirin monotherapy, he has GI bleed appears to have stopped.  I spoke with Dr. Audie Box today, given recurrent GI bleed, we will discontinue his Plavix and to do aspirin monotherapy.  He is aware that he will have slightly higher chance for in-stent restenosis to occur, he is aware to seek urgent medical attention if he start having worsening chest pain or worsening dyspnea on exertion.  He is scheduled to see his GI doctor Dr. Jamey Reas of [place] in January.  He is not on any statins due to intolerance.  Patient also mentions that in the past few month, he has been having episodes of tachycardia palpitation.  One episode occurred at during recent hospitalization, another episode occurred while he was doing cardiac rehab.  He was hooked onto telemetry at the time, he was told he was seen sinus tachycardia rather than irregular rhythm at the time.  We will give him our fax number, in the future if he has again, we would like to have his cardiac rehab fax the EKG strips for [name] to review.  I did not order a heart monitor at this time, even if he does have new A-fib or atrial flutter in the future, I  doubt he will be a candidate for any anticoagulation therapy.  Surprisingly, he has a prescription of 2.5 mg Eliquis at home which was a VTE prophylaxis that was given to him after his previous knee surgery.  I asked him to avoid Eliquis, Plavix or Brilinta at this time.  He will be on aspirin monotherapy.  He can follow-up with Dr. Audie Box as previously scheduled.   Past Medical History:  Diagnosis Date   Allergic rhinitis    Allergy    Anal fissure    Anemia    Anxiety    Blood transfusion without reported diagnosis    Cataract    starting    Coronary atherosclerosis of native coronary artery 2007   nonobstructive CAD by cath with 40% mid-LAD stenosis   Degenerative arthritis of knee, bilateral 10/09/2017   Depression    Diverticulosis 12/18/2011   Diverticulosis of colon with hemorrhage    April 2021, [facility]   Duodenal mass    Fibromyalgia    GERD with stricture    Helicobacter pylori (H. 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   Vitamin D deficiency 11/29/2017    Past Surgical History:  Procedure Laterality Date   bladder distention     x 6   CARDIAC CATHETERIZATION     CATHETER REMOVAL     super pubic area   COLONOSCOPY     CORONARY STENT INTERVENTION N/A 05/29/2022   Procedure: CORONARY STENT INTERVENTION;  Surgeon: Martinique, Peter M, MD;  Location: [place] [facility];  Service: Cardiovascular;  Laterality: N/A;   DENTAL SURGERY Right    #30 tooth extraction (right upper)   INTERSTIM IMPLANT PLACEMENT     INTERSTIM IMPLANT REMOVAL     KNEE ARTHROSCOPY     right x 2   LEFT HEART CATH AND CORONARY ANGIOGRAPHY N/A 05/29/2022   Procedure: LEFT HEART CATH AND CORONARY ANGIOGRAPHY;  Surgeon: Martinique, Peter M, MD;  Location: [place] [facility];  Service: Cardiovascular;  Laterality: N/A;   NISSEN FUNDOPLICATION  5038   PROSTECTOMY     ROBOT ASSISTED LAPAROSCOPIC COMPLETE CYSTECT ILEAL CONDUIT     SHOULDER ARTHROSCOPY  2010   left   TONSILLECTOMY AND ADENOIDECTOMY  1957   TOTAL KNEE ARTHROPLASTY  Right 09/08/2021   Procedure: TOTAL KNEE ARTHROPLASTY;  Surgeon: Susa Day, MD;  Location: [facility];  Service: Orthopedics;  Laterality: Right;   TRANSURETHRAL RESECTION OF PROSTATE     x 2   UPPER GASTROINTESTINAL ENDOSCOPY  05/13/2008   hiatal hernia   VASECTOMY      Current Medications: Current Meds  Medication Sig   aspirin EC 81 MG tablet Take 1 tablet (81 mg total) by mouth daily. Swallow whole.   Camphor-Eucalyptus-Menthol (VICKS VAPORUB EX) Apply 1 Application topically daily as needed (congestion).   diclofenac Sodium (VOLTAREN) 1 % GEL Apply 1 Application topically 4 (four) times daily as needed (  pain).   diphenoxylate-atropine (LOMOTIL) 2.5-0.025 MG tablet Take 1 tablet by mouth 4 (four) times daily as needed for diarrhea or loose stools.   LORazepam (ATIVAN) 0.5 MG tablet Take 0.5 tablets (0.25 mg total) by mouth 2 (two) times daily as needed for anxiety.   Multiple Vitamin (MULTIVITAMIN ADULT PO) Take 1 tablet by mouth daily.   Omega-3 Fatty Acids (OMEGA-3 FISH OIL PO) Take 1 tablet by mouth daily.   pantoprazole (PROTONIX) 40 MG tablet Take 1 tablet (40 mg total) by mouth daily.   traMADol (ULTRAM) 50 MG tablet TAKE 1 TABLET BY MOUTH TWICE A DAY AS NEEDED   [DISCONTINUED] amitriptyline (ELAVIL) 25 MG tablet Take 25 mg by mouth at bedtime.   [DISCONTINUED] Cholecalciferol (DIALYVITE VITAMIN D 5000) 125 MCG (5000 UT) capsule Take 1,000 Units by mouth daily.   [DISCONTINUED] clopidogrel (PLAVIX) 75 MG tablet Take 1 tablet (75 mg total) by mouth daily.   [DISCONTINUED] gabapentin (NEURONTIN) 300 MG capsule 1-2 tab by mouth once at bedtime as needed     Allergies:   Ambien [zolpidem], Bentyl [dicyclomine], Carafate [sucralfate], Lansoprazole, Lexapro [escitalopram oxalate], Other, Pneumovax [pneumococcal polysaccharide vaccine], Prilosec [omeprazole], Remeron [mirtazapine], Seroquel [quetiapine], Statins, Tizanidine, Tramadol, and Tylenol [acetaminophen]   Social History    Socioeconomic History   Marital status: Married    Spouse name: Not on file   Number of children: 4   Years of education: Not on file   Highest education level: Not on file  Occupational History   Occupation: retired  Tobacco Use   Smoking status: Former   Smokeless tobacco: Never   Tobacco comments:    quit 1980  Vaping Use   Vaping Use: Never used  Substance and Sexual Activity   Alcohol use: No    Alcohol/week: 0.0 standard drinks of alcohol   Drug use: No   Sexual activity: Yes    Partners: Female  Other Topics Concern   Not on file  Social History Narrative   Married, retired for children   He is a former smoker no alcohol or substance use   Social Determinants of Radio broadcast assistant Strain: Low Risk  (12/08/2021)   Overall Financial Resource Strain (CARDIA)    Difficulty of Paying Living Expenses: Not hard at all  Food Insecurity: No Food Insecurity (12/08/2021)   Hunger Vital Sign    Worried About Running Out of Food in the Last Year: Never true    Ran Out of Food in the Last Year: Never true  Transportation Needs: No Transportation Needs (12/08/2021)   PRAPARE - Hydrologist (Medical): No    Lack of Transportation (Non-Medical): No  Physical Activity: Insufficiently Active (08/28/2022)   Exercise Vital Sign    Days of Exercise per Week: 2 days    Minutes of Exercise per Session: 10 min  Stress: Stress Concern Present (09/11/2022)   [place]    Feeling of Stress : Rather much  Social Connections: Moderately Integrated (12/08/2021)   Social Connection and Isolation Panel [NHANES]    Frequency of Communication with Friends and Family: Three times a week    Frequency of Social Gatherings with Friends and Family: Three times a week    Attends Religious Services: More than 4 times per year    Active Member of Clubs or Organizations: No    Attends Theatre manager Meetings: Never    Marital Status: Married  Family History: The patient's family history includes Arthritis in his mother; Breast cancer in his sister; Colon polyps in his father; Diabetes in his father, paternal uncle, and sister; Heart disease in his father and mother; Hypertension in his father and mother; Liver cancer in his maternal grandfather and paternal uncle; Stroke in his mother. There is no history of Colon cancer, Esophageal cancer, Rectal cancer, Stomach cancer, or Pancreatic cancer.  ROS:   Please see the history of present illness.     All other systems reviewed and are negative.  EKGs/Labs/Other Studies Reviewed:    The following studies were reviewed today:  Echo 05/21/2022 1. Left ventricular ejection fraction, by estimation, is 60 to 65%. The  left ventricle has normal function. The left ventricle has no regional  wall motion abnormalities. Left ventricular diastolic parameters are  consistent with Grade I diastolic  dysfunction (impaired relaxation).   2. Right ventricular systolic function is normal. The right ventricular  size is normal. There is normal pulmonary artery systolic pressure. The  estimated right ventricular systolic pressure is 17.0 mmHg.   3. The mitral valve is grossly normal. Trivial mitral valve  regurgitation. No evidence of mitral stenosis.   4. The aortic valve is tricuspid. Aortic valve regurgitation is mild. No  aortic stenosis is present.   5. The inferior vena cava is dilated in size with >50% respiratory  variability, suggesting right atrial pressure of 8 mmHg.    Cath 05/29/2022   Ost RCA lesion is 50% stenosed.   Prox LAD to Mid LAD lesion is 70% stenosed.   Prox LAD lesion is 90% stenosed.   Mid Cx lesion is 50% stenosed.   A drug-eluting stent was successfully placed using a SYNERGY XD 3.0X20.   Post intervention, there is a 0% residual stenosis.   Post intervention, there is a 0% residual stenosis.   LV end  diastolic pressure is normal.   Single vessel obstructive CAD Normal LVEDP Successful PCI of the proximal to mid LAD with OCT guidance and DES x 1   Plan: DAPT for 6 months. Anticipate same day DC.   EKG:  EKG is not ordered today.    Recent Labs: 01/22/2022: TSH 3.25 04/16/2022: ALT 14 05/18/2022: BUN 22; Creatinine, Ser 1.41; Hemoglobin 14.5; Platelets 326; Potassium 5.2; Sodium 139  Recent Lipid Panel    Component Value Date/Time   CHOL 238 (H) 01/22/2022 1049   TRIG 185.0 (H) 01/22/2022 1049   HDL 47.10 01/22/2022 1049   CHOLHDL 5 01/22/2022 1049   VLDL 37.0 01/22/2022 1049   LDLCALC 153 (H) 01/22/2022 1049   LDLDIRECT 169.0 06/24/2018 1408     Risk Assessment/Calculations:           Physical Exam:    VS:  BP 128/60   Pulse 94   Ht '5\' 7"'$  (1.702 m)   Wt 168 lb (76.2 kg)   BMI 26.31 kg/m        Wt Readings from Last 3 Encounters:  09/10/22 168 lb (76.2 kg)  07/26/22 169 lb (76.7 kg)  07/19/22 171 lb (77.6 kg)     GEN:  Well nourished, well developed in no acute distress HEENT: Normal NECK: No JVD; No carotid bruits LYMPHATICS: No lymphadenopathy CARDIAC: RRR, no murmurs, rubs, gallops RESPIRATORY:  Clear to auscultation without rales, wheezing or rhonchi  ABDOMEN: Soft, non-tender, non-distended MUSCULOSKELETAL:  No edema; No deformity  SKIN: Warm and dry NEUROLOGIC:  Alert and oriented x 3 PSYCHIATRIC:  Normal affect  ASSESSMENT:    1. Coronary artery disease involving native coronary artery of native heart without angina pectoris   2. Gastrointestinal hemorrhage, unspecified gastrointestinal hemorrhage type   3. Hyperlipidemia LDL goal <70   4. Primary hypertension   5. Palpitations    PLAN:    In order of problems listed above:  CAD: Recently underwent PCI in July 2023.  Unfortunately, he keeps having recurrent GI bleed on combination of aspirin and Plavix.  I discussed his case with Dr. Audie Box, risk of bleeding is very high at this time.   Patient has tried to take Plavix, however he keeps having recurrent GI bleed as result of it.  We ultimately decided to take his Plavix off and continue on aspirin monotherapy.  He is aware of the high risk for in-stent restenosis, however his bleeding risk is likely higher.  He is aware that to seek urgent medical attention if has any anginal symptom that is not alleviated by nitroglycerin  Recurrent GI bleed: Followed by Dr. Jamey Reas of [place] gastroenterology service.  Hyperlipidemia: He is intolerant of statins  Hypertension: Blood pressure stable  Palpitation: He mentions recently he had several episode of tachycardia palpitation.  One of the episode occurred while he was doing cardiac rehab.  He was hooked on telemetry at the time.  He was told he was in normal rhythm but tachycardic.  Given the recurrent GI bleed, he is not going to be a candidate for anticoagulation therapy even if he does have A-fib.  We will continue observation for now.           Medication Adjustments/Labs and Tests Ordered: Current medicines are reviewed at length with the patient today.  Concerns regarding medicines are outlined above.  No orders of the defined types were placed in this encounter.  No orders of the defined types were placed in this encounter.   Patient Instructions  Medication Instructions:  STOP Plavix (Clopidogrel)   *If you need a refill on your cardiac medications before your next appointment, please call your pharmacy*  Lab Work: NONE ordered at this time of appointment   If you have labs (blood work) drawn today and your tests are completely normal, you will receive your results only by: [place] (if you have MyChart) OR A paper copy in the mail If you have any lab test that is abnormal or we need to change your treatment, we will call you to review the results.  Testing/Procedures: NONE ordered at this time of appointment   Follow-Up: At [facility], you and your health needs are our priority.  As part of our continuing mission to provide you with exceptional heart care, we have created designated Provider Care Teams.  These Care Teams include your primary Cardiologist (physician) and Advanced Practice Providers (APPs -  Physician Assistants and Nurse Practitioners) who all work together to provide you with the care you need, when you need it.  Your next appointment:   As previously scheduled    The format for your next appointment:   In Person  Provider:   Evalina Field, MD     Other Instructions  Important Information About [place]       Hilbert Corrigan, [place]  09/12/2022 10:24 PM    [place]

## 2022-09-11 ENCOUNTER — Ambulatory Visit: Payer: Self-pay | Admitting: Licensed Clinical Social Worker

## 2022-09-11 NOTE — Patient Instructions (Signed)
Visit Information  Thank you for taking time to visit with me today. Please don't hesitate to contact me if I can be of assistance to you before our next scheduled telephone appointment.  Following are the goals we discussed today:   Our next appointment is by telephone on 09/25/22 at 10:30 AM  Please call the care guide team at [phone] if you need to cancel or reschedule your appointment.   If you are experiencing a Mental Health or [place] or need someone to talk to, please go to [facility] [place] [phone])   Following is a copy of your full plan of care:   Interventions: Informed client about Care Coordination program support Talked with client about client support with PCP Discussed recent medical care received by client.  Provided counseling support with client Discussed client support with cardiologist Reviewed in home support of client. Spouse of client is supportive of client Client agreed for LCSW to call client in next 3 weeks to discuss client needs  Mr. Middlebrooks was given information about Care Management services by the embedded care coordination team including:  Care Management services include personalized support from designated clinical staff supervised by his physician, including individualized plan of care and coordination with other care providers 24/7 contact phone numbers for assistance for urgent and routine care needs. The patient may stop CCM services at any time (effective at the end of the month) by phone call to the office staff.  Patient agreed to services and verbal consent obtained.   Norva Riffle.Dandra Velardi MSW, [place] [employer] representative [facility] Care Management [phone]

## 2022-09-11 NOTE — Patient Outreach (Signed)
  Care Coordination   Follow Up Visit Note   09/11/2022 Name: [name] Sr. MRN: [id number] DOB: 07-Oct-1946  Erik Lolling Sr. is a 76 y.o. year old male who sees [name], MD for primary care. I spoke with  Erik Lolling Sr. by phone today.  What matters to the patients health and wellness today? Client interested in program support. He is trying to attend appointments with PCP and with cardiologist    Goals Addressed               This Visit's Progress     Patient is interested in support with program.  He said he is seeing PCP for appointment today. (pt-stated)        Interventions: Informed client about Care Coordination program support Talked with client about client support with PCP Discussed recent medical care received by client.  Provided counseling support with client Discussed client support with cardiologist Reviewed in home support of client. Spouse of client is supportive of client Client agreed for LCSW to call client in next 3 weeks to discuss client needs     SDOH assessments and interventions completed:  Yes  SDOH Interventions Today    Flowsheet Row Most Recent Value  SDOH Interventions   Depression Interventions/Treatment  Counseling  Stress Interventions Provide Counseling  [client has stress related to managing medical needs]        Care Coordination Interventions Activated:  Yes  Care Coordination Interventions:  Yes, provided   Follow up plan: Follow up call scheduled for 09/25/22 at 10:30 PM    Encounter Outcome:  Pt. Visit Completed   Norva Riffle.Brindley Madarang MSW, [place] Holiday representative [facility] Care Management [phone]

## 2022-09-12 ENCOUNTER — Encounter: Payer: Self-pay | Admitting: Internal Medicine

## 2022-09-13 ENCOUNTER — Encounter: Payer: Self-pay | Admitting: Pharmacist

## 2022-09-13 NOTE — Progress Notes (Signed)
[place] [facility])                                            [place] Team                                        Statin Quality Measure Assessment    09/13/2022  Erik PERAL Sr. Oct 23, 1946 [id number]  Dr. Jenny Reichmann,   I am a [facility] clinical pharmacist that reviews patients for statin quality initiatives.     Per review of chart and payor information, Mr. Barberi has a diagnosis of cardiovascular disease but is not currently filling a statin prescription.  This places him into the [facility] (Statin Use In Patients with Cardiovascular Disease) measure for CMS.    Patient has documented trials of statins with reported joint pain, but no corresponding CPT codes that would exclude patient from [facility] measure.  If clinically relevant, please consider evaluating Mr. Lopezmartinez for past statin intolerance at tomorrow's office visit.  Please consider ONE of the following recommendations:  Code for past statin intolerance  (required annually)  Provider Requirements: Must asociate code during an office visit or telehealth encounter   Drug Induced Myopathy G72.0   Myalgia M79.1   Myositis, unspecified M60.9   Myopathy, unspecified G72.9   Rhabdomyolysis M62.82     Thank you for your time and consideration!  Curlene Labrum, PharmD [place] Pharmacist Office: [phone]

## 2022-09-14 ENCOUNTER — Ambulatory Visit (INDEPENDENT_AMBULATORY_CARE_PROVIDER_SITE_OTHER): Payer: Medicare Other | Admitting: Internal Medicine

## 2022-09-14 VITALS — BP 118/64 | HR 83 | Temp 98.2°F | Ht 67.0 in | Wt 170.0 lb

## 2022-09-14 DIAGNOSIS — K922 Gastrointestinal hemorrhage, unspecified: Secondary | ICD-10-CM

## 2022-09-14 DIAGNOSIS — D500 Iron deficiency anemia secondary to blood loss (chronic): Secondary | ICD-10-CM | POA: Diagnosis not present

## 2022-09-14 DIAGNOSIS — J309 Allergic rhinitis, unspecified: Secondary | ICD-10-CM

## 2022-09-14 DIAGNOSIS — E559 Vitamin D deficiency, unspecified: Secondary | ICD-10-CM

## 2022-09-14 LAB — BASIC METABOLIC PANEL
BUN: 21 mg/dL (ref 6–23)
CO2: 27 mEq/L (ref 19–32)
Calcium: 9.2 mg/dL (ref 8.4–10.5)
Chloride: 103 mEq/L (ref 96–112)
Creatinine, Ser: 1.33 mg/dL (ref 0.40–1.50)
GFR: 52.29 mL/min — ABNORMAL LOW (ref >60.00)
Glucose, Bld: 76 mg/dL (ref 70–99)
Potassium: 4.1 mEq/L (ref 3.5–5.1)
Sodium: 137 mEq/L (ref 135–145)

## 2022-09-14 LAB — CBC WITH DIFFERENTIAL/PLATELET
Basophils Absolute: 0.1 10*3/uL (ref 0.0–0.1)
Basophils Relative: 1 % (ref 0.0–3.0)
Eosinophils Absolute: 0.2 10*3/uL (ref 0.0–0.7)
Eosinophils Relative: 2.2 % (ref 0.0–5.0)
HCT: 26.7 % — ABNORMAL LOW (ref 39.0–52.0)
Hemoglobin: 8.9 g/dL — ABNORMAL LOW (ref 13.0–17.0)
Lymphocytes Relative: 28.3 % (ref 12.0–46.0)
Lymphs Abs: 2.7 10*3/uL (ref 0.7–4.0)
MCHC: 33.5 g/dL (ref 30.0–36.0)
MCV: 86.1 fl (ref 78.0–100.0)
Monocytes Absolute: 0.9 10*3/uL (ref 0.1–1.0)
Monocytes Relative: 9.5 % (ref 3.0–12.0)
Neutro Abs: 5.6 10*3/uL (ref 1.4–7.7)
Neutrophils Relative %: 59 % (ref 43.0–77.0)
Platelets: 522 10*3/uL — ABNORMAL HIGH (ref 150.0–400.0)
RBC: 3.1 Mil/uL — ABNORMAL LOW (ref 4.22–5.81)
RDW: 13.9 % (ref 11.5–15.5)
WBC: 9.5 10*3/uL (ref 4.0–10.5)

## 2022-09-14 LAB — HEPATIC FUNCTION PANEL
ALT: 12 U/L (ref 0–53)
AST: 15 U/L (ref 0–37)
Albumin: 4.1 g/dL (ref 3.5–5.2)
Alkaline Phosphatase: 67 U/L (ref 39–117)
Bilirubin, Direct: 0.1 mg/dL (ref 0.0–0.3)
Total Bilirubin: 0.2 mg/dL (ref 0.2–1.2)
Total Protein: 7 g/dL (ref 6.0–8.3)

## 2022-09-14 NOTE — Progress Notes (Unsigned)
Patient ID: Erik Lolling Sr., male   DOB: Jun 26, 1946, 76 y.o.   MRN: [id number]        Chief Complaint: follow up recent GI bleeding, chest congestion and allergies,         HPI:  Erik LIGMAN Sr. is a 76 y.o. male here for f/u post hospn [facility]  10/22 - 10/28 with persistent GI bleeding from the terminal ileum anastamosis, had Hgb drift to 7.5, received 2 units PRBC and iron infusion per pt. IR consulted and he underwent arteriogram but no bleeding was identified. GI consulted and he underwent colon prep and colonoscopy:  Pt had no further signs of GI bleeding. The examined portion of the ileum was normal. The examination was otherwise normal on direct and retroflexion views.  Non-bleeding internal hemorrhoids- Diverticulosis in the left colon.  Pt states has overall diffuse soreness more near the anastomois site, but o/w Denies worsening reflux, abd pain, dysphagia, n/v, bowel change or blood.   Pt denies polydipsia, polyuria, or new focal neuro s/s.    Pt denies fever, wt loss, night sweats, loss of appetite, or other constitutional symptoms  Does have allergy with post nasal gtt and hard to mobilize bronchial congestion in the am. .        Wt Readings from Last 3 Encounters:  09/14/22 170 lb (77.1 kg)  09/10/22 168 lb (76.2 kg)  07/26/22 169 lb (76.7 kg)   BP Readings from Last 3 Encounters:  09/14/22 118/64  09/10/22 128/60  07/26/22 120/72         Past Medical History:  Diagnosis Date   Allergic rhinitis    Allergy    Anal fissure    Anemia    Anxiety    Blood transfusion without reported diagnosis    Cataract    starting   Coronary atherosclerosis of native coronary artery 2007   nonobstructive CAD by cath with 40% mid-LAD stenosis   Degenerative arthritis of knee, bilateral 10/09/2017   Depression    Diverticulosis 12/18/2011   Diverticulosis of colon with hemorrhage    April 2021, [facility]   Duodenal mass    Fibromyalgia    GERD with stricture    Helicobacter  pylori (H. 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   Vitamin D deficiency 11/29/2017   Past Surgical History:  Procedure Laterality Date   bladder distention     x 6   CARDIAC CATHETERIZATION     CATHETER REMOVAL     super pubic area   COLONOSCOPY     CORONARY STENT INTERVENTION N/A 05/29/2022   Procedure: CORONARY STENT INTERVENTION;  Surgeon: Martinique, Peter M, MD;  Location: [facility];  Service: Cardiovascular;  Laterality: N/A;   DENTAL SURGERY Right    #30 tooth extraction (right upper)   INTERSTIM IMPLANT PLACEMENT     INTERSTIM IMPLANT REMOVAL  KNEE ARTHROSCOPY     right x 2   LEFT HEART CATH AND CORONARY ANGIOGRAPHY N/A 05/29/2022   Procedure: LEFT HEART CATH AND CORONARY ANGIOGRAPHY;  Surgeon: Martinique, Peter M, MD;  Location: [place];  Service: Cardiovascular;  Laterality: N/A;   NISSEN FUNDOPLICATION  2725   PROSTECTOMY     ROBOT ASSISTED LAPAROSCOPIC COMPLETE CYSTECT ILEAL CONDUIT     SHOULDER ARTHROSCOPY  2010   left   TONSILLECTOMY AND ADENOIDECTOMY  1957   TOTAL KNEE ARTHROPLASTY Right 09/08/2021   Procedure: TOTAL KNEE ARTHROPLASTY;  Surgeon: Susa Day, MD;  Location: [facility];  Service: Orthopedics;  Laterality: Right;   TRANSURETHRAL RESECTION OF PROSTATE     x 2   UPPER GASTROINTESTINAL ENDOSCOPY  05/13/2008   hiatal hernia   VASECTOMY      reports that he has quit smoking. He has never used smokeless tobacco. He reports that  he does not drink alcohol and does not use drugs. family history includes Arthritis in his mother; Breast cancer in his sister; Colon polyps in his father; Diabetes in his father, paternal uncle, and sister; Heart disease in his father and mother; Hypertension in his father and mother; Liver cancer in his maternal grandfather and paternal uncle; Stroke in his mother. Allergies  Allergen Reactions   Ambien [Zolpidem]     Unknown reaction   Bentyl [Dicyclomine]     Memory loss, anxiety, blurred vision   Carafate [Sucralfate] Nausea And Vomiting   Lansoprazole Diarrhea   Lexapro [Escitalopram Oxalate]     Unknown reaction    Other     Pt states he is sensitive to all oral medications   Pneumovax [Pneumococcal Polysaccharide Vaccine] Other (See Comments)    pain   Prilosec [Omeprazole] Other (See Comments)    Per patient joint pain with PPI's   Remeron [Mirtazapine]     Unknown reaction    Seroquel [Quetiapine] Other (See Comments)    Unknown reaction    Statins Other (See Comments)    Joint pain   Tizanidine Other (See Comments)    Made patient "feel crazy"   Tramadol Other (See Comments)    Pt can take small amounts up to twice daily but continued use causes chest / abdominal pain   Tylenol [Acetaminophen] Nausea And Vomiting    Can only take for 2 takes then he starts to have pain   Current Outpatient Medications on File Prior to Visit  Medication Sig Dispense Refill   aspirin EC 81 MG tablet Take 1 tablet (81 mg total) by mouth daily. Swallow whole. 90 tablet 3   Camphor-Eucalyptus-Menthol (VICKS VAPORUB EX) Apply 1 Application topically daily as needed (congestion).     diclofenac Sodium (VOLTAREN) 1 % GEL Apply 1 Application topically 4 (four) times daily as needed (pain).     diphenoxylate-atropine (LOMOTIL) 2.5-0.025 MG tablet Take 1 tablet by mouth 4 (four) times daily as needed for diarrhea or loose stools.     LORazepam (ATIVAN) 0.5 MG tablet Take 0.5 tablets (0.25 mg  total) by mouth 2 (two) times daily as needed for anxiety. 30 tablet 2   Multiple Vitamin (MULTIVITAMIN ADULT PO) Take 1 tablet by mouth daily.     Omega-3 Fatty Acids (OMEGA-3 FISH OIL PO) Take 1 tablet by mouth daily.     pantoprazole (PROTONIX) 40 MG tablet Take 1 tablet (40 mg total) by mouth daily. 90 tablet 3   traMADol (ULTRAM) 50 MG tablet TAKE 1 TABLET BY MOUTH TWICE  A DAY AS NEEDED 120 tablet 2   nitroGLYCERIN (NITROSTAT) 0.4 MG SL tablet Place 1 tablet (0.4 mg total) under the tongue every 5 (five) minutes as needed for chest pain. 25 tablet 3   No current facility-administered medications on file prior to visit.        ROS:  All others reviewed and negative.  Objective        PE:  BP 118/64   Pulse 83   Temp 98.2 F (36.8 C)   Ht '5\' 7"'$  (1.702 m)   Wt 170 lb (77.1 kg)   SpO2 97%   BMI 26.63 kg/m                 Constitutional: Pt appears in NAD               HENT: Head: NCAT.                Right Ear: External ear normal.                 Left Ear: External ear normal.                Eyes: . Pupils are equal, round, and reactive to light. Conjunctivae and EOM are normal               Nose: without d/c or deformity               Neck: Neck supple. Gross normal ROM               Cardiovascular: Normal rate and regular rhythm.                 Pulmonary/Chest: Effort normal and breath sounds without rales or wheezing.                Abd:  Soft, NT, ND, + BS, no organomegaly               Neurological: Pt is alert. At baseline orientation, motor grossly intact               Skin: Skin is warm. No rashes, no other new lesions, LE edema - none               Psychiatric: Pt behavior is normal without agitation   Micro: none  Cardiac tracings I have personally interpreted today:  none  Pertinent Radiological findings (summarize): none   Lab Results  Component Value Date   WBC 9.5 09/14/2022   HGB 8.9 (L) 09/14/2022   HCT 26.7 (L) 09/14/2022   PLT 522.0 (H) 09/14/2022    GLUCOSE 76 09/14/2022   CHOL 238 (H) 01/22/2022   TRIG 185.0 (H) 01/22/2022   HDL 47.10 01/22/2022   LDLDIRECT 169.0 06/24/2018   LDLCALC 153 (H) 01/22/2022   ALT 12 09/14/2022   AST 15 09/14/2022   NA 137 09/14/2022   K 4.1 09/14/2022   CL 103 09/14/2022   CREATININE 1.33 09/14/2022   BUN 21 09/14/2022   CO2 27 09/14/2022   TSH 3.25 01/22/2022   PSA 0.00 (L) 01/22/2022   HGBA1C 6.3 01/22/2022   Assessment/Plan:  Erik Lolling Sr. is a 76 y.o. White or Caucasian [1] male with  has a past medical history of Allergic rhinitis, Allergy, Anal fissure, Anemia, Anxiety, Blood transfusion without reported diagnosis, Cataract, Coronary atherosclerosis of native coronary artery (2007), Degenerative arthritis of knee, bilateral (10/09/2017), Depression, Diverticulosis (12/18/2011), Diverticulosis of colon with hemorrhage, Duodenal  mass, Fibromyalgia, GERD with stricture, Helicobacter pylori (H. 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  Iron deficiency anemia due to chronic blood loss Also for iron level with labs to reassess  Vitamin D deficiency Last vitamin D Lab Results  Component Value Date   VD25OH 48.90 01/22/2022   Stable, cont oral replacement   Gastrointestinal hemorrhage Severe now no longer bleeding, etiology unclear, for f/u cbc today  Allergic rhinitis Mild to mod, for claritin, nasacort and mucinex bid prn,  to f/u any worsening symptoms or concerns  Followup: Return in about 3 months (around 12/15/2022).  Cathlean Cower, MD 09/16/2022  12:26 PM [facility]

## 2022-09-14 NOTE — Patient Instructions (Signed)

## 2022-09-16 ENCOUNTER — Encounter: Payer: Self-pay | Admitting: Internal Medicine

## 2022-09-16 DIAGNOSIS — K922 Gastrointestinal hemorrhage, unspecified: Secondary | ICD-10-CM | POA: Insufficient documentation

## 2022-09-16 NOTE — Assessment & Plan Note (Signed)
Last vitamin D Lab Results  Component Value Date   VD25OH 48.90 01/22/2022   Stable, cont oral replacement

## 2022-09-16 NOTE — Assessment & Plan Note (Signed)
Also for iron level with labs to reassess

## 2022-09-16 NOTE — Assessment & Plan Note (Signed)
Mild to mod, for claritin, nasacort and mucinex bid prn,  to f/u any worsening symptoms or concerns

## 2022-09-16 NOTE — Assessment & Plan Note (Signed)
Severe now no longer bleeding, etiology unclear, for f/u cbc today

## 2022-09-18 DIAGNOSIS — Z932 Ileostomy status: Secondary | ICD-10-CM | POA: Diagnosis not present

## 2022-09-25 ENCOUNTER — Ambulatory Visit: Payer: Self-pay | Admitting: Licensed Clinical Social Worker

## 2022-09-25 NOTE — Patient Outreach (Signed)
  Care Coordination   09/25/2022 Name: Erik POND Sr. MRN: [id number] DOB: 11/25/1945   Care Coordination Outreach Attempts:  An unsuccessful telephone outreach was attempted today to offer the patient information about available care coordination services as a benefit of their health plan.   Follow Up Plan:  Additional outreach attempts will be made to offer the patient care coordination information and services.   Encounter Outcome:  No Answer  Care Coordination Interventions Activated:  Yes   Care Coordination Interventions:  Yes, provided    Norva Riffle.Latara Micheli MSW, [place] representative [facility] Care Management [phone]

## 2022-09-25 NOTE — Patient Instructions (Signed)
Visit Information  Thank you for taking time to visit with me today. Please don't hesitate to contact me if I can be of assistance to you before our next scheduled telephone appointment.  Following are the goals we discussed today:   Our next appointment is by telephone on 10/22/22 at 1:30 PM   Please call the care guide team at [phone] if you need to cancel or reschedule your appointment.   If you are experiencing a Mental Health or [place] or need someone to talk to, please go to [facility] [phone])   Following is a copy of your full plan of care:   Interventions:  Called client phone number 2 times today but LCSW was not able to speak via phone with client. LCSW left phone message for client asking him to call LCSW at [phone].   [name] was given information about Care Management services by the embedded care coordination team including:  Care Management services include personalized support from designated clinical staff supervised by his physician, including individualized plan of care and coordination with other care providers 24/7 contact phone numbers for assistance for urgent and routine care needs. The patient may stop CCM services at any time (effective at the end of the month) by phone call to the office staff.  Patient agreed to services and verbal consent obtained.   Norva Riffle.Jonai Weyland MSW, [place] [employer] representative [facility] Care Management [phone]

## 2022-09-27 ENCOUNTER — Encounter: Payer: Self-pay | Admitting: Internal Medicine

## 2022-10-02 ENCOUNTER — Encounter: Payer: Self-pay | Admitting: Cardiovascular Disease

## 2022-10-02 MED ORDER — PANTOPRAZOLE SODIUM 40 MG PO TBEC: 40.0000 mg | DELAYED_RELEASE_TABLET | Freq: Every day | ORAL | 3 refills | Status: DC

## 2022-10-02 NOTE — Telephone Encounter (Signed)
Spoke with pt regarding his protonix, he recently (3 weeks ago) had another GI bleed but he also believes that his protonix is causing him to feel nauseous and preventing him from sleeping more that 3-4 hours a night. Pt states he was told that he would have to check with his provider before stopping because of recent GI bleed. Advised pt that I would forward this message over to Dr. Audie Box to advise. Pt would like a call back when we hear back from Dr. Audie Box. Pt verbalizes understanding.

## 2022-10-15 DIAGNOSIS — Z955 Presence of coronary angioplasty implant and graft: Secondary | ICD-10-CM | POA: Diagnosis not present

## 2022-10-15 DIAGNOSIS — Z5189 Encounter for other specified aftercare: Secondary | ICD-10-CM | POA: Diagnosis not present

## 2022-10-17 DIAGNOSIS — Z5189 Encounter for other specified aftercare: Secondary | ICD-10-CM | POA: Diagnosis not present

## 2022-10-17 DIAGNOSIS — Z955 Presence of coronary angioplasty implant and graft: Secondary | ICD-10-CM | POA: Diagnosis not present

## 2022-10-18 DIAGNOSIS — Z955 Presence of coronary angioplasty implant and graft: Secondary | ICD-10-CM | POA: Diagnosis not present

## 2022-10-18 DIAGNOSIS — Z5189 Encounter for other specified aftercare: Secondary | ICD-10-CM | POA: Diagnosis not present

## 2022-10-20 DIAGNOSIS — Z932 Ileostomy status: Secondary | ICD-10-CM | POA: Diagnosis not present

## 2022-10-22 ENCOUNTER — Encounter: Payer: Self-pay | Admitting: Internal Medicine

## 2022-10-22 ENCOUNTER — Encounter: Payer: Self-pay | Admitting: Licensed Clinical Social Worker

## 2022-10-24 ENCOUNTER — Ambulatory Visit: Payer: Medicare Other | Admitting: Internal Medicine

## 2022-10-24 DIAGNOSIS — Z5189 Encounter for other specified aftercare: Secondary | ICD-10-CM | POA: Diagnosis not present

## 2022-10-24 DIAGNOSIS — Z955 Presence of coronary angioplasty implant and graft: Secondary | ICD-10-CM | POA: Diagnosis not present

## 2022-10-25 ENCOUNTER — Ambulatory Visit: Payer: Medicare Other | Admitting: Family Medicine

## 2022-10-25 DIAGNOSIS — Z5189 Encounter for other specified aftercare: Secondary | ICD-10-CM | POA: Diagnosis not present

## 2022-10-25 DIAGNOSIS — Z955 Presence of coronary angioplasty implant and graft: Secondary | ICD-10-CM | POA: Diagnosis not present

## 2022-10-26 ENCOUNTER — Ambulatory Visit (INDEPENDENT_AMBULATORY_CARE_PROVIDER_SITE_OTHER): Payer: Medicare Other | Admitting: Internal Medicine

## 2022-10-26 VITALS — BP 122/66 | HR 76 | Temp 97.9°F | Ht 67.0 in | Wt 170.0 lb

## 2022-10-26 DIAGNOSIS — E559 Vitamin D deficiency, unspecified: Secondary | ICD-10-CM | POA: Diagnosis not present

## 2022-10-26 DIAGNOSIS — L299 Pruritus, unspecified: Secondary | ICD-10-CM

## 2022-10-26 DIAGNOSIS — R42 Dizziness and giddiness: Secondary | ICD-10-CM

## 2022-10-26 DIAGNOSIS — J309 Allergic rhinitis, unspecified: Secondary | ICD-10-CM

## 2022-10-26 MED ORDER — TRIAMCINOLONE ACETONIDE 0.1 % EX CREA: 1.0000 | TOPICAL_CREAM | Freq: Two times a day (BID) | CUTANEOUS | 1 refills | Status: DC | PRN

## 2022-10-26 NOTE — Patient Instructions (Signed)
Please take all new medication as prescribed  - the steroid cream as needed for the ear itching'  Please continue all other medications as before, and refills have been done if requested.  Please have the pharmacy call with any other refills you may need.  Please continue your efforts at being more active, low cholesterol diet, and weight control.  You are otherwise up to date with prevention measures today.  Please keep your appointments with your specialists as you may have planned  You will be contacted regarding the referral for: Allergy, and MRI head  We can hold on lab tests today  Please make an Appointment to return in 3 months, or sooner if needed

## 2022-10-26 NOTE — Progress Notes (Unsigned)
Patient ID: Erik Lolling Sr., male   DOB: 01-Apr-1946, 76 y.o.   MRN: [id number]        Chief Complaint: follow up allergies, dizziness, low vit d, ear itch       HPI:  Erik SOM Sr. is a 76 y.o. male here overall not doing as well with 1 wk worsening marked dizziness constant moderate, not really worsening with position change or position posture.  Pt denies chest pain, increased sob or doe, wheezing, orthopnea, PND, increased LE swelling, palpitations, dizziness or syncope.   Pt denies polydipsia, polyuria, or new focal neuro s/s.    Pt denies fever, wt loss, night sweats, loss of appetite, or other constitutional symptoms  Does have several wks ongoing nasal allergy symptoms with clearish congestion, itch and sneezing, without fever, pain, ST, cough, swelling or wheezing.       Wt Readings from Last 3 Encounters:  10/26/22 170 lb (77.1 kg)  09/14/22 170 lb (77.1 kg)  09/10/22 168 lb (76.2 kg)   BP Readings from Last 3 Encounters:  10/26/22 122/66  09/14/22 118/64  09/10/22 128/60         Past Medical History:  Diagnosis Date   Allergic rhinitis    Allergy    Anal fissure    Anemia    Anxiety    Blood transfusion without reported diagnosis    Cataract    starting   Coronary atherosclerosis of native coronary artery 2007   nonobstructive CAD by cath with 40% mid-LAD stenosis   Degenerative arthritis of knee, bilateral 10/09/2017   Depression    Diverticulosis 12/18/2011   Diverticulosis of colon with hemorrhage    April 2021, [facility] healthcare   Duodenal mass    Fibromyalgia    GERD with stricture    Helicobacter pylori (H. 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   Vitamin D deficiency 11/29/2017   Past Surgical History:  Procedure Laterality Date   bladder distention     x 6   CARDIAC CATHETERIZATION     CATHETER REMOVAL     super pubic area   COLONOSCOPY     CORONARY STENT INTERVENTION N/A 05/29/2022   Procedure: CORONARY STENT INTERVENTION;  Surgeon: Martinique, Peter M, MD;  Location: [facility];  Service: Cardiovascular;  Laterality: N/A;   DENTAL SURGERY Right    #30 tooth extraction (right upper)   INTERSTIM IMPLANT PLACEMENT     INTERSTIM IMPLANT REMOVAL     KNEE ARTHROSCOPY     right x 2   LEFT HEART CATH AND CORONARY ANGIOGRAPHY N/A 05/29/2022   Procedure: LEFT HEART CATH AND CORONARY ANGIOGRAPHY;  Surgeon: Martinique, Peter M, MD;  Location: [place];  Service: Cardiovascular;  Laterality: N/A;   NISSEN FUNDOPLICATION  9702   PROSTECTOMY     ROBOT ASSISTED LAPAROSCOPIC COMPLETE CYSTECT ILEAL CONDUIT     SHOULDER  ARTHROSCOPY  2010   left   TONSILLECTOMY AND ADENOIDECTOMY  1957   TOTAL KNEE ARTHROPLASTY Right 09/08/2021   Procedure: TOTAL KNEE ARTHROPLASTY;  Surgeon: Susa Day, MD;  Location: [facility];  Service: Orthopedics;  Laterality: Right;   TRANSURETHRAL RESECTION OF PROSTATE     x 2   UPPER GASTROINTESTINAL ENDOSCOPY  05/13/2008   hiatal hernia   VASECTOMY      reports that he has quit smoking. He has never used smokeless tobacco. He reports that he does not drink alcohol and does not use drugs. family history includes Arthritis in his mother; Breast cancer in his sister; Colon polyps in his father; Diabetes in his father, paternal uncle, and sister; Heart disease in his father and mother; Hypertension in his father and mother; Liver cancer in his maternal grandfather and paternal uncle; Stroke in his  mother. Allergies  Allergen Reactions   Ambien [Zolpidem]     Unknown reaction   Bentyl [Dicyclomine]     Memory loss, anxiety, blurred vision   Carafate [Sucralfate] Nausea And Vomiting   Lansoprazole Diarrhea   Lexapro [Escitalopram Oxalate]     Unknown reaction    Other     Pt states he is sensitive to all oral medications   Pneumovax [Pneumococcal Polysaccharide Vaccine] Other (See Comments)    pain   Prilosec [Omeprazole] Other (See Comments)    Per patient joint pain with PPI's   Remeron [Mirtazapine]     Unknown reaction    Seroquel [Quetiapine] Other (See Comments)    Unknown reaction    Statins Other (See Comments)    Joint pain   Tizanidine Other (See Comments)    Made patient "feel crazy"   Tramadol Other (See Comments)    Pt can take small amounts up to twice daily but continued use causes chest / abdominal pain   Tylenol [Acetaminophen] Nausea And Vomiting    Can only take for 2 takes then he starts to have pain   Current Outpatient Medications on File Prior to Visit  Medication Sig Dispense Refill   amitriptyline (ELAVIL) 50 MG tablet      aspirin EC 81 MG tablet Take 1 tablet (81 mg total) by mouth daily. Swallow whole. 90 tablet 3   Camphor-Eucalyptus-Menthol (VICKS VAPORUB EX) Apply 1 Application topically daily as needed (congestion).     diclofenac Sodium (VOLTAREN) 1 % GEL Apply 1 Application topically 4 (four) times daily as needed (pain).     diphenoxylate-atropine (LOMOTIL) 2.5-0.025 MG tablet Take 1 tablet by mouth 4 (four) times daily as needed for diarrhea or loose stools.     LORazepam (ATIVAN) 0.5 MG tablet Take 0.5 tablets (0.25 mg total) by mouth 2 (two) times daily as needed for anxiety. 30 tablet 2   Multiple Vitamin (MULTIVITAMIN ADULT PO) Take 1 tablet by mouth daily.     Omega-3 Fatty Acids (OMEGA-3 FISH OIL PO) Take 1 tablet by mouth daily.     traMADol (ULTRAM) 50 MG tablet TAKE 1 TABLET BY MOUTH TWICE A DAY AS NEEDED 120 tablet 2    nitroGLYCERIN (NITROSTAT) 0.4 MG SL tablet Place 1 tablet (0.4 mg total) under the tongue every 5 (five) minutes as needed for chest pain. 25 tablet 3   No current facility-administered medications on file prior to visit.        ROS:  All others reviewed and negative.  Objective        PE:  BP 122/66 (BP Location: Right Arm, Patient Position:  Sitting, Cuff Size: Large)   Pulse 76   Temp 97.9 F (36.6 C) (Oral)   Ht '5\' 7"'$  (1.702 m)   Wt 170 lb (77.1 kg)   SpO2 97%   BMI 26.63 kg/m                 Constitutional: Pt appears in NAD               HENT: Head: NCAT.                Right Ear: External ear normal.                 Left Ear: External ear normal. Bilat tm's with mild erythema.  Max sinus areas non tender.  Pharynx with mild erythema, no exudate               Eyes: . Pupils are equal, round, and reactive to light. Conjunctivae and EOM are normal               Nose: without d/c or deformity               Neck: Neck supple. Gross normal ROM               Cardiovascular: Normal rate and regular rhythm.                 Pulmonary/Chest: Effort normal and breath sounds without rales or wheezing.                Abd:  Soft, NT, ND, + BS, no organomegaly               Neurological: Pt is alert. At baseline orientation, motor grossly intact               Skin: Skin is warm. No rashes, no other new lesions, LE edema - none               Psychiatric: Pt behavior is normal without agitation   Micro: none  Cardiac tracings I have personally interpreted today:  none  Pertinent Radiological findings (summarize): none   Lab Results  Component Value Date   WBC 9.5 09/14/2022   HGB 8.9 (L) 09/14/2022   HCT 26.7 (L) 09/14/2022   PLT 522.0 (H) 09/14/2022   GLUCOSE 76 09/14/2022   CHOL 238 (H) 01/22/2022   TRIG 185.0 (H) 01/22/2022   HDL 47.10 01/22/2022   LDLDIRECT 169.0 06/24/2018   LDLCALC 153 (H) 01/22/2022   ALT 12 09/14/2022   AST 15 09/14/2022   NA 137 09/14/2022   K 4.1  09/14/2022   CL 103 09/14/2022   CREATININE 1.33 09/14/2022   BUN 21 09/14/2022   CO2 27 09/14/2022   TSH 3.25 01/22/2022   PSA 0.00 (L) 01/22/2022   HGBA1C 6.3 01/22/2022   Assessment/Plan:  Erik Lolling Sr. is a 76 y.o. White or Caucasian [1] male with  has a past medical history of Allergic rhinitis, Allergy, Anal fissure, Anemia, Anxiety, Blood transfusion without reported diagnosis, Cataract, Coronary atherosclerosis of native coronary artery (2007), Degenerative arthritis of knee, bilateral (10/09/2017), Depression, Diverticulosis (12/18/2011), Diverticulosis of colon with hemorrhage, Duodenal mass, Fibromyalgia, GERD with stricture, Helicobacter pylori (H. pylori) infection (11/25/2012), Hiatal hernia, HLD (hyperlipidemia), HTN (hypertension), Hyperlipidemia, IBS (irritable bowel syndrome), Impotence of organic origin, Insomnia, Internal and external hemorrhoids without complication, Interstitial cystitis, Irritable bowel syndrome (06/09/2010), Osteoporosis, Pelvic floor dysfunction (11/29/2017), Pneumonia, Reactive hypoglycemia, Rheumatoid arthritis(714.0), Somatization disorder (02/27/2012),  and Vitamin D deficiency (11/29/2017).  Allergic rhinitis  mod, for nasacort, also refer allergist,  to f/u any worsening symptoms or concerns   Vitamin D deficiency Last vitamin D Lab Results  Component Value Date   VD25OH 48.90 01/22/2022   Stable, cont oral replacement   Dizziness Recent worsening, etiology unclear, for MRI brain  Ear itch Ok for triam cr prn asd  Followup: Return in about 3 months (around 01/25/2023).  Cathlean Cower, MD 10/28/2022 4:30 PM [facility]

## 2022-10-27 ENCOUNTER — Encounter: Payer: Self-pay | Admitting: Internal Medicine

## 2022-10-28 ENCOUNTER — Encounter: Payer: Self-pay | Admitting: Internal Medicine

## 2022-10-28 DIAGNOSIS — L299 Pruritus, unspecified: Secondary | ICD-10-CM | POA: Insufficient documentation

## 2022-10-28 DIAGNOSIS — R42 Dizziness and giddiness: Secondary | ICD-10-CM | POA: Insufficient documentation

## 2022-10-28 NOTE — Assessment & Plan Note (Signed)
Recent worsening, etiology unclear, for MRI brain

## 2022-10-28 NOTE — Assessment & Plan Note (Signed)
Ok for triam cr prn asd

## 2022-10-28 NOTE — Assessment & Plan Note (Signed)
mod, for nasacort, also refer allergist,  to f/u any worsening symptoms or concerns

## 2022-10-28 NOTE — Assessment & Plan Note (Signed)
Last vitamin D Lab Results  Component Value Date   VD25OH 48.90 01/22/2022   Stable, cont oral replacement

## 2022-10-29 DIAGNOSIS — Z5189 Encounter for other specified aftercare: Secondary | ICD-10-CM | POA: Diagnosis not present

## 2022-10-29 DIAGNOSIS — Z955 Presence of coronary angioplasty implant and graft: Secondary | ICD-10-CM | POA: Diagnosis not present

## 2022-10-29 MED ORDER — CORTISPORIN-TC 3.3-3-10-0.5 MG/ML OT SUSP: 4.0000 [drp] | Freq: Four times a day (QID) | OTIC | 0 refills | Status: DC

## 2022-10-29 NOTE — Telephone Encounter (Signed)
Ok this ear drop antibx was sent to [facility]

## 2022-10-29 NOTE — Telephone Encounter (Signed)
See below

## 2022-10-29 NOTE — Telephone Encounter (Signed)
Patient states that the Rx for his ears is topical as he thought it would be drops, please advise

## 2022-10-30 ENCOUNTER — Telehealth: Payer: Self-pay

## 2022-10-30 MED ORDER — NEOMYCIN-POLYMYXIN-HC 3.5-10000-1 OT SOLN: 4.0000 [drp] | Freq: Four times a day (QID) | OTIC | 0 refills | Status: DC

## 2022-10-30 NOTE — Telephone Encounter (Signed)
Ok I changed it to the regular cortisporin - done erx

## 2022-10-30 NOTE — Telephone Encounter (Signed)
Was just informed by pharmacy that cortisporin-tc is out of stock and what they have available does not have the ingredient thonzonium, please advise if still ok for patient use

## 2022-10-31 DIAGNOSIS — Z955 Presence of coronary angioplasty implant and graft: Secondary | ICD-10-CM | POA: Diagnosis not present

## 2022-10-31 DIAGNOSIS — Z5189 Encounter for other specified aftercare: Secondary | ICD-10-CM | POA: Diagnosis not present

## 2022-11-01 ENCOUNTER — Encounter: Payer: Self-pay | Admitting: Internal Medicine

## 2022-11-01 DIAGNOSIS — Z5189 Encounter for other specified aftercare: Secondary | ICD-10-CM | POA: Diagnosis not present

## 2022-11-01 DIAGNOSIS — Z955 Presence of coronary angioplasty implant and graft: Secondary | ICD-10-CM | POA: Diagnosis not present

## 2022-11-07 DIAGNOSIS — Z955 Presence of coronary angioplasty implant and graft: Secondary | ICD-10-CM | POA: Diagnosis not present

## 2022-11-07 DIAGNOSIS — Z5189 Encounter for other specified aftercare: Secondary | ICD-10-CM | POA: Diagnosis not present

## 2022-11-08 ENCOUNTER — Encounter: Payer: Self-pay | Admitting: Cardiovascular Disease

## 2022-11-08 DIAGNOSIS — Z955 Presence of coronary angioplasty implant and graft: Secondary | ICD-10-CM | POA: Diagnosis not present

## 2022-11-08 DIAGNOSIS — Z5189 Encounter for other specified aftercare: Secondary | ICD-10-CM | POA: Diagnosis not present

## 2022-11-12 ENCOUNTER — Encounter: Payer: Self-pay | Admitting: Internal Medicine

## 2022-11-13 MED ORDER — LORAZEPAM 0.5 MG PO TABS: 0.2500 mg | ORAL_TABLET | Freq: Two times a day (BID) | ORAL | 2 refills | Status: DC | PRN

## 2022-11-13 NOTE — Telephone Encounter (Signed)
I have refilled the [place] to take 2 of the 0.5 mg tabs about 1 hour prior to the procedure. And the rest as needed only

## 2022-11-13 NOTE — Telephone Encounter (Signed)
Please advise if possible, patient is claustrophobic and is is requesting meds before his brain scan tomorrow.

## 2022-11-14 ENCOUNTER — Ambulatory Visit
Admission: RE | Admit: 2022-11-14 | Discharge: 2022-11-14 | Disposition: A | Discharge status: Home / Self Care | Payer: Medicare Other | Source: Ambulatory Visit | Attending: Internal Medicine | Admitting: Internal Medicine

## 2022-11-14 DIAGNOSIS — J341 Cyst and mucocele of nose and nasal sinus: Secondary | ICD-10-CM | POA: Diagnosis not present

## 2022-11-14 DIAGNOSIS — R42 Dizziness and giddiness: Secondary | ICD-10-CM

## 2022-11-14 DIAGNOSIS — Z955 Presence of coronary angioplasty implant and graft: Secondary | ICD-10-CM | POA: Diagnosis not present

## 2022-11-14 DIAGNOSIS — Z5189 Encounter for other specified aftercare: Secondary | ICD-10-CM | POA: Diagnosis not present

## 2022-11-14 DIAGNOSIS — R200 Anesthesia of skin: Secondary | ICD-10-CM | POA: Diagnosis not present

## 2022-11-14 DIAGNOSIS — H9190 Unspecified hearing loss, unspecified ear: Secondary | ICD-10-CM | POA: Diagnosis not present

## 2022-11-14 DIAGNOSIS — R262 Difficulty in walking, not elsewhere classified: Secondary | ICD-10-CM | POA: Diagnosis not present

## 2022-11-15 DIAGNOSIS — Z5189 Encounter for other specified aftercare: Secondary | ICD-10-CM | POA: Diagnosis not present

## 2022-11-15 DIAGNOSIS — Z955 Presence of coronary angioplasty implant and graft: Secondary | ICD-10-CM | POA: Diagnosis not present

## 2022-11-16 ENCOUNTER — Other Ambulatory Visit: Payer: Self-pay | Admitting: Internal Medicine

## 2022-11-16 DIAGNOSIS — J341 Cyst and mucocele of nose and nasal sinus: Secondary | ICD-10-CM

## 2022-11-19 ENCOUNTER — Ambulatory Visit (HOSPITAL_BASED_OUTPATIENT_CLINIC_OR_DEPARTMENT_OTHER): Payer: Medicare Other | Admitting: Family

## 2022-11-19 ENCOUNTER — Other Ambulatory Visit (INDEPENDENT_AMBULATORY_CARE_PROVIDER_SITE_OTHER): Payer: Medicare Other

## 2022-11-19 ENCOUNTER — Encounter (HOSPITAL_BASED_OUTPATIENT_CLINIC_OR_DEPARTMENT_OTHER): Payer: Self-pay | Admitting: Family

## 2022-11-19 VITALS — BP 144/71 | HR 74 | Ht 67.0 in | Wt 174.0 lb

## 2022-11-19 DIAGNOSIS — E785 Hyperlipidemia, unspecified: Secondary | ICD-10-CM

## 2022-11-19 DIAGNOSIS — I25118 Atherosclerotic heart disease of native coronary artery with other forms of angina pectoris: Secondary | ICD-10-CM | POA: Diagnosis not present

## 2022-11-19 DIAGNOSIS — R42 Dizziness and giddiness: Secondary | ICD-10-CM

## 2022-11-19 DIAGNOSIS — I10 Essential (primary) hypertension: Secondary | ICD-10-CM | POA: Diagnosis not present

## 2022-11-19 DIAGNOSIS — Z955 Presence of coronary angioplasty implant and graft: Secondary | ICD-10-CM | POA: Diagnosis not present

## 2022-11-19 DIAGNOSIS — Z5189 Encounter for other specified aftercare: Secondary | ICD-10-CM | POA: Diagnosis not present

## 2022-11-19 DIAGNOSIS — R002 Palpitations: Secondary | ICD-10-CM

## 2022-11-19 DIAGNOSIS — R052 Subacute cough: Secondary | ICD-10-CM

## 2022-11-19 DIAGNOSIS — Z8719 Personal history of other diseases of the digestive system: Secondary | ICD-10-CM | POA: Diagnosis not present

## 2022-11-19 MED ORDER — BENZONATATE 100 MG PO CAPS: 100.0000 mg | ORAL_CAPSULE | Freq: Three times a day (TID) | ORAL | 0 refills | Status: DC | PRN

## 2022-11-19 NOTE — Patient Instructions (Addendum)
Medication Instructions:  Continue your current medications.   Start: Tessalon Pearls for Cough   *If you need a refill on your cardiac medications before your next appointment, please call your pharmacy*  Testing/Procedures:  Your physician has recommended that you wear a Zio monitor.   This monitor is a medical device that records the heart's electrical activity. Doctors most often use these monitors to diagnose arrhythmias. Arrhythmias are problems with the speed or rhythm of the heartbeat. The monitor is a small device applied to your chest. You can wear one while you do your normal daily activities. While wearing this monitor if you have any symptoms to push the button and record what you felt. Once you have worn this monitor for the period of time provider prescribed (for 7 days), you will return the monitor device in the postage paid box. Once it is returned they will download the data collected and provide [name] with a report which the provider will then review and we will call you with those results. Important tips:  Avoid showering during the first 24 hours of wearing the monitor. Avoid excessive sweating to help maximize wear time. Do not submerge the device, no hot tubs, and no swimming pools. Keep any lotions or oils away from the patch. After 24 hours you may shower with the patch on. Take brief showers with your back facing the shower head.  Do not remove patch once it has been placed because that will interrupt data and decrease adhesive wear time. Push the button when you have any symptoms and write down what you were feeling. Once you have completed wearing your monitor, remove and place into box which has postage paid and place in your outgoing mailbox.  If for some reason you have misplaced your box then call our office and we can provide another box and/or mail it off for you.  Follow-Up: At [facility], you and your health needs are our priority.  As part of our  continuing mission to provide you with exceptional heart care, we have created designated Provider Care Teams.  These Care Teams include your primary Cardiologist (physician) and Advanced Practice Providers (APPs -  Physician Assistants and Nurse Practitioners) who all work together to provide you with the care you need, when you need it.  We recommend signing up for the patient portal called "MyChart".  Sign up information is provided on this After Visit Summary.  MyChart is used to connect with patients for Virtual Visits (Telemedicine).  Patients are able to view lab/test results, encounter notes, upcoming appointments, etc.  Non-urgent messages can be sent to your provider as well.   To learn more about what you can do with MyChart, go to [email or web address].    Your next appointment:   As scheduled  Other Instructions  To prevent lightheadedness/dizziness Wear knee-high compression stockings during the daytime. Ones labeled 15-20 mmHg provide good compression. Stay well hydrated Make position changes slowly  To prevent palpitations: Make sure you are adequately hydrated.  Avoid and/or limit caffeine containing beverages like soda or tea. Exercise regularly.  Manage stress well. Some over the counter medications can cause palpitations such as Benadryl, AdvilPM, TylenolPM. Regular Advil or Tylenol do not cause palpitations.    Orthostatic Hypotension Blood pressure is a measurement of how strongly, or weakly, your circulating blood is pressing against the walls of your arteries. Orthostatic hypotension is a drop in blood pressure that can happen when you change positions, such as when you go  from lying down to standing. Arteries are blood vessels that carry blood from your heart throughout your body. When blood pressure is too low, you may not get enough blood to your brain or to the rest of your organs. Orthostatic hypotension can cause light-headedness, sweating, rapid heartbeat,  blurred vision, and fainting. These symptoms require further investigation into the cause. What are the causes? Orthostatic hypotension can be caused by many things, including: Sudden changes in posture, such as standing up quickly after you have been sitting or lying down. Loss of blood (anemia) or loss of body fluids (dehydration). Heart problems, neurologic problems, or hormone problems. Pregnancy. Aging. The risk for this condition increases as you get older. Severe infection (sepsis). Certain medicines, such as medicines for high blood pressure or medicines that make the body lose excess fluids (diuretics). What are the signs or symptoms? Symptoms of this condition may include: Weakness, light-headedness, or dizziness. Sweating. Blurred vision. Tiredness (fatigue). Rapid heartbeat. Fainting, in severe cases. How is this diagnosed? This condition is diagnosed based on: Your symptoms and medical history. Your blood pressure measurements. Your health care provider will check your blood pressure when you are: Lying down. Sitting. Standing. A blood pressure reading is recorded as two numbers, such as "120 over 80" (or 120/80). The first ("top") number is called the systolic pressure. It is a measure of the pressure in your arteries as your heart beats. The second ("bottom") number is called the diastolic pressure. It is a measure of the pressure in your arteries when your heart relaxes between beats. Blood pressure is measured in a unit called mmHg. Healthy blood pressure for most adults is 120/80 mmHg. Orthostatic hypotension is defined as a 20 mmHg drop in systolic pressure or a 10 mmHg drop in diastolic pressure within 3 minutes of standing. Other information or tests that may be used to diagnose orthostatic hypotension include: Your other vital signs, such as your heart rate and temperature. Blood tests. An electrocardiogram (ECG) or echocardiogram. A Holter monitor. This is a  device you wear that records your heart rhythm continuously, usually for 24-48 hours. Tilt table test. For this test, you will be safely secured to a table that moves you from a lying position to an upright position. Your heart rhythm and blood pressure will be monitored during the test. How is this treated? This condition may be treated by: Changing your diet. This may involve eating more salt (sodium) or drinking more water. Changing the dosage of certain medicines you are taking that might be lowering your blood pressure. Correcting the underlying reason for the orthostatic hypotension. Wearing compression stockings. Taking medicines to raise your blood pressure. Avoiding actions that trigger symptoms. Follow these instructions at home: Medicines Take over-the-counter and prescription medicines only as told by your health care provider. Follow instructions from your health care provider about changing the dosage of your current medicines, if this applies. Do not stop or adjust any of your medicines on your own. Eating and drinking  Drink enough fluid to keep your urine pale yellow. Eat extra salt only as directed. Do not add extra salt to your diet unless advised by your health care provider. Eat frequent, small meals. Avoid standing up suddenly after eating. General instructions  Get up slowly from lying down or sitting positions. This gives your blood pressure a chance to adjust. Avoid hot showers and excessive heat as directed by your health care provider. Engage in regular physical activity as directed by your health care  provider. If you have compression stockings, wear them as told. Keep all follow-up visits. This is important. Contact a health care provider if: You have a fever for more than 2-3 days. You feel more thirsty than usual. You feel dizzy or weak. Get help right away if: You have chest pain. You have a fast or irregular heartbeat. You become sweaty or feel  light-headed. You feel short of breath. You faint. You have any symptoms of a stroke. "BE FAST" is an easy way to remember the main warning signs of a stroke: B - Balance. Signs are dizziness, sudden trouble walking, or loss of balance. E - Eyes. Signs are trouble seeing or a sudden change in vision. F - Face. Signs are sudden weakness or numbness of the face, or the face or eyelid drooping on one side. A - Arms. Signs are weakness or numbness in an arm. This happens suddenly and usually on one side of the body. S - Speech. Signs are sudden trouble speaking, slurred speech, or trouble understanding what people say. T - Time. Time to call emergency services. Write down what time symptoms started. You have other signs of a stroke, such as: A sudden, severe headache with no known cause. Nausea or vomiting. Seizure. These symptoms may represent a serious problem that is an emergency. Do not wait to see if the symptoms will go away. Get medical help right away. Call your local emergency services (911 in the [place] Do not drive yourself to the hospital. Summary Orthostatic hypotension is a sudden drop in blood pressure. It can cause light-headedness, sweating, rapid heartbeat, blurred vision, and fainting. Orthostatic hypotension can be diagnosed by having your blood pressure taken while lying down, sitting, and then standing. Treatment may involve changing your diet, wearing compression stockings, sitting up slowly, adjusting your medicines, or correcting the underlying reason for the orthostatic hypotension. Get help right away if you have chest pain, a fast or irregular heartbeat, or symptoms of a stroke. This information is not intended to replace advice given to you by your health care provider. Make sure you discuss any questions you have with your health care provider. Document Revised: 01/12/2021 Document Reviewed: 01/12/2021 Elsevier Patient Education  [place].

## 2022-11-19 NOTE — Progress Notes (Signed)
Office Visit    Patient Name: Erik RECORE Sr. Date of Encounter: 11/19/2022  PCP:  [name], MD   [place]  Cardiologist:  Evalina Field, MD  Advanced Practice Provider:  No care team member to display Electrophysiologist:  None      Chief Complaint    Erik Lolling Sr. is a 77 y.o. male presents today for concern of lightheadedness, tachycardia.    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   Vitamin D deficiency 11/29/2017   Past Surgical History:  Procedure Laterality Date   bladder distention     x 6   CARDIAC CATHETERIZATION     CATHETER REMOVAL     super pubic area   COLONOSCOPY     CORONARY STENT INTERVENTION N/A 05/29/2022   Procedure: CORONARY STENT INTERVENTION;  Surgeon: Martinique, Peter M, MD;  Location: [place] [facility];  Service: Cardiovascular;   Laterality: N/A;   DENTAL SURGERY Right    #30 tooth extraction (right upper)   INTERSTIM IMPLANT PLACEMENT     INTERSTIM IMPLANT REMOVAL     KNEE ARTHROSCOPY     right x 2   LEFT HEART CATH AND CORONARY ANGIOGRAPHY N/A 05/29/2022   Procedure: LEFT HEART CATH AND CORONARY ANGIOGRAPHY;  Surgeon: Martinique, Peter M, MD;  Location: [facility];  Service: Cardiovascular;  Laterality: N/A;   NISSEN FUNDOPLICATION  8546   PROSTECTOMY     ROBOT ASSISTED LAPAROSCOPIC COMPLETE CYSTECT ILEAL CONDUIT     SHOULDER ARTHROSCOPY  2010   left   TONSILLECTOMY AND ADENOIDECTOMY  1957   TOTAL KNEE ARTHROPLASTY Right 09/08/2021   Procedure: TOTAL KNEE ARTHROPLASTY;  Surgeon: Susa Day, MD;  Location: [facility];  Service: Orthopedics;  Laterality: Right;   TRANSURETHRAL RESECTION OF PROSTATE     x 2   UPPER GASTROINTESTINAL ENDOSCOPY  05/13/2008   hiatal hernia   VASECTOMY      Allergies  Allergies  Allergen Reactions   Ambien [Zolpidem]     Unknown reaction   Bentyl [Dicyclomine]     Memory loss, anxiety, blurred vision   Carafate [Sucralfate] Nausea And Vomiting   Lansoprazole Diarrhea  Lexapro [Escitalopram Oxalate]     Unknown reaction    Other     Pt states he is sensitive to all oral medications   Pneumovax [Pneumococcal Polysaccharide Vaccine] Other (See Comments)    pain   Prilosec [Omeprazole] Other (See Comments)    Per patient joint pain with PPI's   Remeron [Mirtazapine]     Unknown reaction    Seroquel [Quetiapine] Other (See Comments)    Unknown reaction    Statins Other (See Comments)    Joint pain   Tizanidine Other (See Comments)    Made patient "feel crazy"   Tramadol Other (See Comments)    Pt can take small amounts up to twice daily but continued use causes chest / abdominal pain   Tylenol [Acetaminophen] Nausea And Vomiting    Can only take for 2 takes then he starts to have pain    History of Present Illness    Erik GRAVES Sr. is a 77 y.o. male  with a hx of CAD s/p DES-LAD 05/2022, GI bleed, anxiety, anemia, HTN, HLD, somatizations disorder, rheumatoid arthritis last seen 09/10/22 by Almyra Deforest, PA.Marland Kitchen  He had a GI bleed at [facility] in 2021.  EGD 01/2022 revealed gastritis.  Echo 05/21/22 LVEF 60-65%, gr1DD, RV normal, mild AI. Cardiac catheterization 05/21/22 50% ostial RCA, 70% proximal to mid LAD, 90% proximal LAD lesion treated with DES, 50% mid left circumflex lesion.  Brilinta later switched to Plavix due to shortness of breath. Had GI bleed 08/2022 and Plavix stopped.   The office 11/08/2022 noting episodes of heart racing with minimal activity, pounding sensation in his head, weight gain, itching/scratching, nighttime cough, dizziness on position changes or bending over and fatigue.  MR brain 11/14/22 ordered by PCP unremarkable brain. Did not left maxillary sinus mucous retention cyst.   He presents today for follow up with a myriad of concerns detailed below. Each of his symptoms occur independently of one another   Episodes of racing heart over the last 1-2 weeks particularly with activity. His Fit Bit registered up to 160 bpm. Drinks water "all day" and eats regular meals. No caffeine. Tells me he is "very easily upset" but has no significant recent stressors.   Chest pain describes as burning sensation that occurs at rest or with activity. It lasts a few seconds. This is overall intermittent. Notes no correlation with meals. It has not lasted long enough for him to require nitroglycerin.   Evening cough that is nonproductive. He has been taking nighttime cold and flu for the last 7-10 days. It is "just enough to keep you from getting to sleep".   Dizziness described as both lightheadedness and dizziness. It improves with rest. This is most common with position change. He has never worn compression stockings. Reviewed orthostatic hypotension.  Throbbing in head and hearing a "pulsating rushing sound".  It occurs when he gets up  with position changes as well. He wonders if this is related to cyst in left maxillary sinus area and has upcoming appt with ENT.   EKGs/Labs/Other Studies Reviewed:   The following studies were reviewed today: Echo 05/21/2022 1. Left ventricular ejection fraction, by estimation, is 60 to 65%. The  left ventricle has normal function. The left ventricle has no regional  wall motion abnormalities. Left ventricular diastolic parameters are  consistent with Grade I diastolic  dysfunction (impaired relaxation).   2. Right ventricular systolic function is normal. The right ventricular  size is normal. There is  normal pulmonary artery systolic pressure. The  estimated right ventricular systolic pressure is 36.1 mmHg.   3. The mitral valve is grossly normal. Trivial mitral valve  regurgitation. No evidence of mitral stenosis.   4. The aortic valve is tricuspid. Aortic valve regurgitation is mild. No  aortic stenosis is present.   5. The inferior vena cava is dilated in size with >50% respiratory  variability, suggesting right atrial pressure of 8 mmHg.      Cath 05/29/2022   Ost RCA lesion is 50% stenosed.   Prox LAD to Mid LAD lesion is 70% stenosed.   Prox LAD lesion is 90% stenosed.   Mid Cx lesion is 50% stenosed.   A drug-eluting stent was successfully placed using a SYNERGY XD 3.0X20.   Post intervention, there is a 0% residual stenosis.   Post intervention, there is a 0% residual stenosis.   LV end diastolic pressure is normal.   Single vessel obstructive CAD Normal LVEDP Successful PCI of the proximal to mid LAD with OCT guidance and DES x 1   Plan: DAPT for 6 months. Anticipate same day DC.  EKG:  EKG is ordered today.  The ekg ordered today demonstrates NSR 74 bpm with no acute ST/T wave changes.   Recent Labs: 01/22/2022: TSH 3.25 09/14/2022: ALT 12; BUN 21; Creatinine, Ser 1.33; Hemoglobin 8.9; Platelets 522.0; Potassium 4.1; Sodium 137  Recent Lipid Panel    Component  Value Date/Time   CHOL 238 (H) 01/22/2022 1049   TRIG 185.0 (H) 01/22/2022 1049   HDL 47.10 01/22/2022 1049   CHOLHDL 5 01/22/2022 1049   VLDL 37.0 01/22/2022 1049   LDLCALC 153 (H) 01/22/2022 1049   LDLDIRECT 169.0 06/24/2018 1408   Home Medications   Current Meds  Medication Sig   amitriptyline (ELAVIL) 50 MG tablet    aspirin EC 81 MG tablet Take 1 tablet (81 mg total) by mouth daily. Swallow whole.   benzonatate (TESSALON PERLES) 100 MG capsule Take 1 capsule (100 mg total) by mouth 3 (three) times daily as needed for cough.   Camphor-Eucalyptus-Menthol (VICKS VAPORUB EX) Apply 1 Application topically daily as needed (congestion).   diclofenac Sodium (VOLTAREN) 1 % GEL Apply 1 Application topically 4 (four) times daily as needed (pain).   diphenoxylate-atropine (LOMOTIL) 2.5-0.025 MG tablet Take 1 tablet by mouth 4 (four) times daily as needed for diarrhea or loose stools.   LORazepam (ATIVAN) 0.5 MG tablet Take 0.5 tablets (0.25 mg total) by mouth 2 (two) times daily as needed for anxiety.   Multiple Vitamin (MULTIVITAMIN ADULT PO) Take 1 tablet by mouth daily.   neomycin-polymyxin-hydrocortisone (CORTISPORIN) OTIC solution Place 4 drops into the left ear 4 (four) times daily.   Omega-3 Fatty Acids (OMEGA-3 FISH OIL PO) Take 1 tablet by mouth daily.   pantoprazole (PROTONIX) 40 MG tablet Take 40 mg by mouth daily.   traMADol (ULTRAM) 50 MG tablet TAKE 1 TABLET BY MOUTH TWICE A DAY AS NEEDED   triamcinolone cream (KENALOG) 0.1 % Apply 1 Application topically 2 (two) times daily as needed.     Review of Systems      All other systems reviewed and are otherwise negative except as noted above.  Physical Exam    VS:  BP (!) 144/71   Pulse 74   Ht '5\' 7"'$  (1.702 m)   Wt 174 lb (78.9 kg)   BMI 27.25 kg/m  , BMI Body mass index is 27.25 kg/m.  Wt Readings from Last 3 Encounters:  11/19/22 174 lb (78.9 kg)  10/26/22 170 lb (77.1 kg)  09/14/22 170 lb (77.1 kg)     GEN: Well  nourished, well developed, in no acute distress. HEENT: normal. Neck: Supple, no JVD, carotid bruits, or masses. Cardiac: RRR, no murmurs, rubs, or gallops. No clubbing, cyanosis, edema.  Radials/PT 2+ and equal bilaterally.  Respiratory:  Respirations regular and unlabored, clear to auscultation bilaterally. GI: Soft, nontender, nondistended. MS: No deformity or atrophy. Skin: Warm and dry, no rash. Neuro:  Strength and sensation are intact. Psych: Normal affect.  Assessment & Plan    CAD - PCI 05/2022. Plavix discontinued 08/2022 due to recurrent GI bleed and has been maintained on Aspirin monotherapy. Present chest pain described a "burning" at rest or with activity lasting only seconds not requiring PRN nitroglycerin. He is higher risk for ISR given monotherapy and statin intolerance. As symptoms fleeting, will not pursue ischemic evaluation at this time. If symptoms persist, consider escalation of antianginal therapy vs ischemic eval with myoview or PET.   Lightheadedness - Particularly with position change c/w orthostatic hypotension though orthostatic vitals negative in clinic today. He does have left maxillary sinus mucous retention cyst which is being evaluated by ENT unclear if contributory. Education provided on orthostatic precautions: Stay well hydrated, eat regular meals, wear compression socks, make position changes slowly.   Orthostatic VS for the past 24 hrs (Last 3 readings):  BP- Lying Pulse- Lying BP- Sitting Pulse- Sitting BP- Standing at 0 minutes Pulse- Standing at 0 minutes BP- Standing at 3 minutes Pulse- Standing at 3 minutes  11/19/22 1448 144/71 72 146/70 73 141/72 76 142/69 75     Cough - Nonproductive x 7-10 days most bothersome at night. Rx Tessalon pearles PRN for cough. Further follow up with PCP.   Recurrent GI bleed - Follows with Dr. Jamey Reas of [facility]. Plavix discontinued 08/2022. No recurrence since Aspirin monotherapy. Reports no  hematuria, melena.   Palpitations - Notes palpitations with sensation of heart racing. Offered Metoprolol Tartrate '25mg'$  PRN but he understandably prefers monitor first. 7 day ZIO placed in clinic. Does not drink caffeine. Encouraged to increase hydration and manage stress well.   HTN - BP not at goal <130/80. Hesitant to increase regimen due to lightheadedness. Discussed to monitor BP at home at least 2 hours after medications and sitting for 5-10 minutes. Not presently on antihypertensive medication but could consider Amlodipine at follow up.  HLD - Intolerant to statin. Per his report PCSK9i was deferred due to his history of multiple medication intolerances. However, on review appears it was to be readdressed after Plavix discontinued. Discuss PCSK9i at follow up.       Disposition: Follow up  as scheduled  with Evalina Field, MD or APP.  Signed, Loel Dubonnet, NP 11/19/2022, 5:19 PM [place]

## 2022-11-22 DIAGNOSIS — Z955 Presence of coronary angioplasty implant and graft: Secondary | ICD-10-CM | POA: Diagnosis not present

## 2022-11-22 DIAGNOSIS — Z5189 Encounter for other specified aftercare: Secondary | ICD-10-CM | POA: Diagnosis not present

## 2022-11-28 ENCOUNTER — Encounter: Payer: Self-pay | Admitting: Internal Medicine

## 2022-11-28 ENCOUNTER — Ambulatory Visit (INDEPENDENT_AMBULATORY_CARE_PROVIDER_SITE_OTHER): Payer: Medicare Other

## 2022-11-28 ENCOUNTER — Ambulatory Visit (INDEPENDENT_AMBULATORY_CARE_PROVIDER_SITE_OTHER): Payer: Medicare Other | Admitting: Internal Medicine

## 2022-11-28 VITALS — BP 132/78 | HR 79 | Temp 97.8°F | Ht 67.0 in | Wt 173.0 lb

## 2022-11-28 DIAGNOSIS — K219 Gastro-esophageal reflux disease without esophagitis: Secondary | ICD-10-CM | POA: Diagnosis not present

## 2022-11-28 DIAGNOSIS — F32A Depression, unspecified: Secondary | ICD-10-CM

## 2022-11-28 DIAGNOSIS — R5383 Other fatigue: Secondary | ICD-10-CM | POA: Diagnosis not present

## 2022-11-28 DIAGNOSIS — R1013 Epigastric pain: Secondary | ICD-10-CM | POA: Diagnosis not present

## 2022-11-28 DIAGNOSIS — E559 Vitamin D deficiency, unspecified: Secondary | ICD-10-CM

## 2022-11-28 DIAGNOSIS — K222 Esophageal obstruction: Secondary | ICD-10-CM | POA: Diagnosis not present

## 2022-11-28 DIAGNOSIS — R052 Subacute cough: Secondary | ICD-10-CM

## 2022-11-28 DIAGNOSIS — R059 Cough, unspecified: Secondary | ICD-10-CM | POA: Diagnosis not present

## 2022-11-28 DIAGNOSIS — R790 Abnormal level of blood mineral: Secondary | ICD-10-CM | POA: Diagnosis not present

## 2022-11-28 DIAGNOSIS — E538 Deficiency of other specified B group vitamins: Secondary | ICD-10-CM | POA: Diagnosis not present

## 2022-11-28 DIAGNOSIS — E7849 Other hyperlipidemia: Secondary | ICD-10-CM | POA: Diagnosis not present

## 2022-11-28 DIAGNOSIS — Z125 Encounter for screening for malignant neoplasm of prostate: Secondary | ICD-10-CM

## 2022-11-28 DIAGNOSIS — R739 Hyperglycemia, unspecified: Secondary | ICD-10-CM

## 2022-11-28 LAB — CBC WITH DIFFERENTIAL/PLATELET
Basophils Absolute: 0.1 10*3/uL (ref 0.0–0.1)
Basophils Relative: 1 % (ref 0.0–3.0)
Eosinophils Absolute: 0.2 10*3/uL (ref 0.0–0.7)
Eosinophils Relative: 1.6 % (ref 0.0–5.0)
HCT: 26.2 % — ABNORMAL LOW (ref 39.0–52.0)
Hemoglobin: 8.3 g/dL — ABNORMAL LOW (ref 13.0–17.0)
Lymphocytes Relative: 24.2 % (ref 12.0–46.0)
Lymphs Abs: 2.3 10*3/uL (ref 0.7–4.0)
MCHC: 31.5 g/dL (ref 30.0–36.0)
MCV: 70.3 fl — ABNORMAL LOW (ref 78.0–100.0)
Monocytes Absolute: 1 10*3/uL (ref 0.1–1.0)
Monocytes Relative: 10.4 % (ref 3.0–12.0)
Neutro Abs: 6 10*3/uL (ref 1.4–7.7)
Neutrophils Relative %: 62.8 % (ref 43.0–77.0)
Platelets: 468 10*3/uL — ABNORMAL HIGH (ref 150.0–400.0)
RBC: 3.74 Mil/uL — ABNORMAL LOW (ref 4.22–5.81)
RDW: 19 % — ABNORMAL HIGH (ref 11.5–15.5)
WBC: 9.5 10*3/uL (ref 4.0–10.5)

## 2022-11-28 LAB — HEPATIC FUNCTION PANEL
ALT: 11 U/L (ref 0–53)
AST: 13 U/L (ref 0–37)
Albumin: 4.2 g/dL (ref 3.5–5.2)
Alkaline Phosphatase: 64 U/L (ref 39–117)
Bilirubin, Direct: 0.1 mg/dL (ref 0.0–0.3)
Total Bilirubin: 0.3 mg/dL (ref 0.2–1.2)
Total Protein: 7.1 g/dL (ref 6.0–8.3)

## 2022-11-28 LAB — BASIC METABOLIC PANEL
BUN: 20 mg/dL (ref 6–23)
CO2: 26 mEq/L (ref 19–32)
Calcium: 9.1 mg/dL (ref 8.4–10.5)
Chloride: 104 mEq/L (ref 96–112)
Creatinine, Ser: 1.37 mg/dL (ref 0.40–1.50)
GFR: 50.39 mL/min — ABNORMAL LOW (ref >60.00)
Glucose, Bld: 110 mg/dL — ABNORMAL HIGH (ref 70–99)
Potassium: 4.8 mEq/L (ref 3.5–5.1)
Sodium: 138 mEq/L (ref 135–145)

## 2022-11-28 LAB — LIPID PANEL
Cholesterol: 170 mg/dL (ref 0–200)
HDL: 42 mg/dL (ref >39.00)
LDL Cholesterol: 94 mg/dL (ref 0–99)
NonHDL: 127.6
Total CHOL/HDL Ratio: 4
Triglycerides: 167 mg/dL — ABNORMAL HIGH (ref 0.0–149.0)
VLDL: 33.4 mg/dL (ref 0.0–40.0)

## 2022-11-28 LAB — SEDIMENTATION RATE: Sed Rate: 36 mm/hr — ABNORMAL HIGH (ref 0–20)

## 2022-11-28 LAB — MAGNESIUM: Magnesium: 2 mg/dL (ref 1.5–2.5)

## 2022-11-28 LAB — HEMOGLOBIN A1C: Hgb A1c MFr Bld: 6.3 % (ref 4.6–6.5)

## 2022-11-28 LAB — TSH: TSH: 2.46 u[IU]/mL (ref 0.35–5.50)

## 2022-11-28 LAB — PSA: PSA: 0 ng/mL — ABNORMAL LOW (ref 0.10–4.00)

## 2022-11-28 LAB — VITAMIN B12: Vitamin B-12: 264 pg/mL (ref 211–911)

## 2022-11-28 LAB — VITAMIN D 25 HYDROXY (VIT D DEFICIENCY, FRACTURES): VITD: 32.16 ng/mL (ref 30.00–100.00)

## 2022-11-28 NOTE — Patient Instructions (Signed)
Ok to continue your OTC cough medicine as needed  Also consider taking OTC Nasacort for sinus symptoms  Please continue all other medications as before, and refills have been done if requested.  Please have the pharmacy call with any other refills you may need.  Please continue your efforts at being more active, low cholesterol diet, and weight control.  You are otherwise up to date with prevention measures today.  Please keep your appointments with your specialists as you may have planned - you may wish to make an appt with Dr Carlean Purl  Please go to the XRAY Department in the first floor for the x-ray testing  Please go to the LAB at the blood drawing area for the tests to be done  You will be contacted by phone if any changes need to be made immediately.  Otherwise, you will receive a letter about your results with an explanation, but please check with MyChart first.  Please remember to sign up for MyChart if you have not done so, as this will be important to you in the future with finding out test results, communicating by private email, and scheduling acute appointments online when needed.

## 2022-11-28 NOTE — Assessment & Plan Note (Signed)
Stable overall on pepcid

## 2022-11-28 NOTE — Assessment & Plan Note (Signed)
Chronic persistent, declines such as prozac for now

## 2022-11-28 NOTE — Assessment & Plan Note (Signed)
Lab Results  Component Value Date   LDLCALC 153 (H) 01/22/2022   Uncontrolled, has been working on lower chol diet,declines statin for now, for f/u lipids with labs

## 2022-11-28 NOTE — Assessment & Plan Note (Signed)
Etiology unclear, for lab including cbc cmet, UA

## 2022-11-28 NOTE — Assessment & Plan Note (Signed)
Last vitamin D Lab Results  Component Value Date   VD25OH 48.90 01/22/2022   Stable, cont oral replacement

## 2022-11-28 NOTE — Assessment & Plan Note (Signed)
D/w pt, for cxr but to also try otc nasacort asd,  to f/u any worsening symptoms or concerns

## 2022-11-28 NOTE — Progress Notes (Signed)
Patient ID: Erik Lolling Sr., male   DOB: Apr 10, 1946, 77 y.o.   MRN: [id number]        Chief Complaint: follow up lack of motivation, fatigued , hld, low vit d, depression       HPI:  Erik SEVILLANO Sr. is a 77 y.o. male here now 5 mo post hospn s/p GI hemorrhage resulting in major abd surgury, states he just hasn't bounced back, and wife is tired of his fatigue and lack of motivation.  Pt admits depression chronic persistent symptoms but is convinced he has some physical reason.  Pt denies chest pain, increased sob or doe, wheezing, orthopnea, PND, increased LE swelling, palpitations, dizziness or syncope.   Pt denies polydipsia, polyuria, or new focal neuro s/s.    Pt denies fever, wt loss, night sweats, loss of appetite, or other constitutional symptoms  Does have ongoing nasal congestion, mild post nasal gtt and c/o persistent cough worse to lie down at night.  Stopped hsi PPI,now taking pepcid for unclear reason, but states Denies worsening reflux, abd pain, dysphagia, n/v, bowel change or blood.          Wt Readings from Last 3 Encounters:  11/28/22 173 lb (78.5 kg)  11/19/22 174 lb (78.9 kg)  10/26/22 170 lb (77.1 kg)   BP Readings from Last 3 Encounters:  11/28/22 132/78  11/19/22 (!) 144/71  10/26/22 122/66         Past Medical History:  Diagnosis Date   Allergic rhinitis    Allergy    Anal fissure    Anemia    Anxiety    Blood transfusion without reported diagnosis    Cataract    starting   Coronary atherosclerosis of native coronary artery 2007   nonobstructive CAD by cath with 40% mid-LAD stenosis   Degenerative arthritis of knee, bilateral 10/09/2017   Depression    Diverticulosis 12/18/2011   Diverticulosis of colon with hemorrhage    April 2021, [facility] healthcare   Duodenal mass    Fibromyalgia    GERD with stricture    Helicobacter pylori (H. 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   Vitamin D deficiency 11/29/2017   Past Surgical History:  Procedure Laterality Date   bladder distention     x 6   CARDIAC CATHETERIZATION     CATHETER REMOVAL     super pubic area   COLONOSCOPY     CORONARY STENT INTERVENTION N/A 05/29/2022   Procedure: CORONARY STENT INTERVENTION;  Surgeon: Martinique, Peter M, MD;  Location: [facility];  Service: Cardiovascular;  Laterality: N/A;   DENTAL SURGERY Right    #30 tooth extraction (right upper)   INTERSTIM IMPLANT PLACEMENT     INTERSTIM IMPLANT REMOVAL     KNEE ARTHROSCOPY     right x 2   LEFT HEART CATH AND CORONARY ANGIOGRAPHY N/A 05/29/2022   Procedure:  LEFT HEART CATH AND CORONARY ANGIOGRAPHY;  Surgeon: Martinique, Peter M, MD;  Location: [facility];  Service: Cardiovascular;  Laterality: N/A;   NISSEN FUNDOPLICATION  3295   PROSTECTOMY     ROBOT ASSISTED LAPAROSCOPIC COMPLETE CYSTECT ILEAL CONDUIT     SHOULDER ARTHROSCOPY  2010   left   TONSILLECTOMY AND ADENOIDECTOMY  1957   TOTAL KNEE ARTHROPLASTY Right 09/08/2021   Procedure: TOTAL KNEE ARTHROPLASTY;  Surgeon: Susa Day, MD;  Location: [facility];  Service: Orthopedics;  Laterality: Right;   TRANSURETHRAL RESECTION OF PROSTATE     x 2   UPPER GASTROINTESTINAL ENDOSCOPY  05/13/2008   hiatal hernia   VASECTOMY      reports that he has quit smoking. He has never used smokeless tobacco. He reports that he does not drink alcohol and does not use drugs. family history includes Arthritis in his mother; Breast cancer in his sister; Colon  polyps in his father; Diabetes in his father, paternal uncle, and sister; Heart disease in his father and mother; Hypertension in his father and mother; Liver cancer in his maternal grandfather and paternal uncle; Stroke in his mother. Allergies  Allergen Reactions   Ambien [Zolpidem]     Unknown reaction   Bentyl [Dicyclomine]     Memory loss, anxiety, blurred vision   Carafate [Sucralfate] Nausea And Vomiting   Lansoprazole Diarrhea   Lexapro [Escitalopram Oxalate]     Unknown reaction    Other     Pt states he is sensitive to all oral medications   Pneumovax [Pneumococcal Polysaccharide Vaccine] Other (See Comments)    pain   Prilosec [Omeprazole] Other (See Comments)    Per patient joint pain with PPI's   Remeron [Mirtazapine]     Unknown reaction    Seroquel [Quetiapine] Other (See Comments)    Unknown reaction    Statins Other (See Comments)    Joint pain   Tizanidine Other (See Comments)    Made patient "feel crazy"   Tramadol Other (See Comments)    Pt can take small amounts up to twice daily but continued use causes chest / abdominal pain   Tylenol [Acetaminophen] Nausea And Vomiting    Can only take for 2 takes then he starts to have pain   Current Outpatient Medications on File Prior to Visit  Medication Sig Dispense Refill   amitriptyline (ELAVIL) 50 MG tablet      aspirin EC 81 MG tablet Take 1 tablet (81 mg total) by mouth daily. Swallow whole. 90 tablet 3   benzonatate (TESSALON PERLES) 100 MG capsule Take 1 capsule (100 mg total) by mouth 3 (three) times daily as needed for cough. 20 capsule 0   Camphor-Eucalyptus-Menthol (VICKS VAPORUB EX) Apply 1 Application topically daily as needed (congestion).     diclofenac Sodium (VOLTAREN) 1 % GEL Apply 1 Application topically 4 (four) times daily as needed (pain).     diphenoxylate-atropine (LOMOTIL) 2.5-0.025 MG tablet Take 1 tablet by mouth 4 (four) times daily as needed for diarrhea or loose stools.     LORazepam  (ATIVAN) 0.5 MG tablet Take 0.5 tablets (0.25 mg total) by mouth 2 (two) times daily as needed for anxiety. 30 tablet 2   Multiple Vitamin (MULTIVITAMIN ADULT PO) Take 1 tablet by mouth daily.     neomycin-polymyxin-hydrocortisone (CORTISPORIN) OTIC solution Place 4 drops into the left ear 4 (four) times daily. 10 mL 0   Omega-3 Fatty Acids (OMEGA-3 FISH OIL PO) Take 1 tablet by mouth  daily.     pantoprazole (PROTONIX) 40 MG tablet Take 40 mg by mouth daily.     traMADol (ULTRAM) 50 MG tablet TAKE 1 TABLET BY MOUTH TWICE A DAY AS NEEDED 120 tablet 2   triamcinolone cream (KENALOG) 0.1 % Apply 1 Application topically 2 (two) times daily as needed. 30 g 1   nitroGLYCERIN (NITROSTAT) 0.4 MG SL tablet Place 1 tablet (0.4 mg total) under the tongue every 5 (five) minutes as needed for chest pain. 25 tablet 3   No current facility-administered medications on file prior to visit.        ROS:  All others reviewed and negative.  Objective        PE:  BP 132/78 (BP Location: Right Arm, Patient Position: Sitting, Cuff Size: Large)   Pulse 79   Temp 97.8 F (36.6 C) (Oral)   Ht '5\' 7"'$  (1.702 m)   Wt 173 lb (78.5 kg)   SpO2 98%   BMI 27.10 kg/m                 Constitutional: Pt appears in NAD, fatigued, slow moveements               HENT: Head: NCAT.                Right Ear: External ear normal.                 Left Ear: External ear normal.                Eyes: . Pupils are equal, round, and reactive to light. Conjunctivae and EOM are normal               Nose: without d/c or deformity               Neck: Neck supple. Gross normal ROM               Cardiovascular: Normal rate and regular rhythm.                 Pulmonary/Chest: Effort normal and breath sounds without rales or wheezing.                Abd:  Soft, NT, ND, + BS, no organomegaly               Neurological: Pt is alert. At baseline orientation, motor grossly intact               Skin: Skin is warm. No rashes, no other new  lesions, LE edema - none               Psychiatric: Pt behavior is normal without agitation , depressed affect  Micro: none  Cardiac tracings I have personally interpreted today:  none  Pertinent Radiological findings (summarize): none   Lab Results  Component Value Date   WBC 9.5 09/14/2022   HGB 8.9 (L) 09/14/2022   HCT 26.7 (L) 09/14/2022   PLT 522.0 (H) 09/14/2022   GLUCOSE 76 09/14/2022   CHOL 238 (H) 01/22/2022   TRIG 185.0 (H) 01/22/2022   HDL 47.10 01/22/2022   LDLDIRECT 169.0 06/24/2018   LDLCALC 153 (H) 01/22/2022   ALT 12 09/14/2022   AST 15 09/14/2022   NA 137 09/14/2022   K 4.1 09/14/2022   CL 103 09/14/2022   CREATININE 1.33 09/14/2022   BUN 21 09/14/2022   CO2 27 09/14/2022   TSH 3.25 01/22/2022   PSA 0.00 (  L) 01/22/2022   HGBA1C 6.3 01/22/2022   Assessment/Plan:  Erik Lolling Sr. is a 77 y.o. White or Caucasian [1] male with  has a past medical history of Allergic rhinitis, Allergy, Anal fissure, Anemia, Anxiety, Blood transfusion without reported diagnosis, Cataract, Coronary atherosclerosis of native coronary artery (2007), Degenerative arthritis of knee, bilateral (10/09/2017), Depression, Diverticulosis (12/18/2011), Diverticulosis of colon with hemorrhage, Duodenal mass, Fibromyalgia, GERD with stricture, Helicobacter pylori (H. 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  Depression Chronic persistent, declines such as prozac for now  GERD with stricture Stable overall on pepcid  Hyperlipidemia Lab Results  Component  Value Date   LDLCALC 153 (H) 01/22/2022   Uncontrolled, has been working on lower chol diet,declines statin for now, for f/u lipids with labs   Vitamin D deficiency Last vitamin D Lab Results  Component Value Date   VD25OH 48.90 01/22/2022   Stable, cont oral replacement   Fatigue Etiology unclear, for lab including cbc cmet, UA  Subacute cough D/w pt, for cxr but to also try otc nasacort asd,  to f/u any worsening symptoms or concerns  Followup: Return if symptoms worsen or fail to improve.  Cathlean Cower, MD 11/28/2022 7:23 PM [facility]

## 2022-11-29 ENCOUNTER — Other Ambulatory Visit: Payer: Self-pay | Admitting: Internal Medicine

## 2022-11-29 ENCOUNTER — Encounter: Payer: Self-pay | Admitting: Cardiovascular Disease

## 2022-11-29 DIAGNOSIS — D500 Iron deficiency anemia secondary to blood loss (chronic): Secondary | ICD-10-CM

## 2022-11-29 DIAGNOSIS — R768 Other specified abnormal immunological findings in serum: Secondary | ICD-10-CM

## 2022-11-29 DIAGNOSIS — D649 Anemia, unspecified: Secondary | ICD-10-CM

## 2022-11-29 LAB — URINALYSIS, ROUTINE W REFLEX MICROSCOPIC
Bilirubin Urine: NEGATIVE
Ketones, ur: NEGATIVE
Nitrite: POSITIVE — AB
Specific Gravity, Urine: 1.015 (ref 1.000–1.030)
Total Protein, Urine: NEGATIVE
Urine Glucose: NEGATIVE
Urobilinogen, UA: 0.2 (ref 0.0–1.0)
pH: 6.5 (ref 5.0–8.0)

## 2022-11-29 LAB — H. PYLORI ANTIBODY, IGG: H Pylori IgG: POSITIVE — AB

## 2022-11-29 NOTE — Progress Notes (Signed)
11/30/2022 Erik HEMMERICH Sr. [id number] 1946-09-02  Referring provider: [name], MD Primary GI doctor: Dr. Carlean Purl  ASSESSMENT AND PLAN:   Symptomatic anemia with history of previous GI bleed 08/2021 while on Plavix and aspirin for recent CAD status post stent 05/21/2022  Positive CTA with active GI bleeding at ileal anastomosis colonoscopy while at [facility] unremarkable. 11/28/22 HGB 8.3, 09/14/22 HGB 8.9, 09/08/2022 Hgb 10 05/18/22 HGB 14.5 Since hospitalization patient's had 2 g drop of hemoglobin He has been taken off his Plavix in October, yesterday stopped aspirin. Patient states he has had some dizziness and shortness of breath, no chest pain. He is unable to tolerate iron orally Has not had any overt GI bleeding since hospitalization Patient's had previous evaluations in the past with endoscopy and capsule endoscopy in 2021 for GI bleed Had a very long conversation with patient and his wife in regards to speed of evaluation, discussed going to the ER for possible evaluation with symptomatic anemia however patient states he is not feeling bad enough to wait in the ER so we will try to do this outpatient with strong ER precautions. Continue PPI will increase to twice daily Will try to get stat referral to hematology for possible iron IV infusion Recent colonoscopy unremarkable, no overt GI bleeding, question slow leak even off anticoagulants.  Will schedule for endoscopy in [place] after cardiac clearance since patient just had cardiac cath with stent 05/2022.   Pending on timing, may need to repeat CBC day or two before EGD Consider capsule endoscopy if EGD negative. ER precautions discussed with the patient.  History of GI bleed with GERD sp nissen fundoplication  2/35/3614 EGD with LA grade a reflux esophagitis and benign-appearing esophageal stenosis which was dilated as well as Nissen fundoplication and gastritis.  Biopsy showed inflammation.  Coronary artery disease of  native artery of native heart with stable angina pectoris ([place]) 05/21/2022 [facility] for chest pain, mid LAD DES, normal LVEDP  Off plavix and ASA now ER precautions discussed   Patient Care Team: [name], MD as PCP - General (Internal Medicine) O'Neal, Cassie Freer, MD as PCP - Cardiology (Cardiology) [name], MD as Attending Physician (Internal Medicine) Domingo Pulse, MD (Urology) Phylliss Blakes, OD as Consulting Physician (Optometry)  HISTORY OF PRESENT ILLNESS: 77 y.o. male with a past medical history of coronary artery disease on Plavix, interstitial cystitis status post ileostomy, diverticulosis, and others listed below presents for evaluation of GI bleeding.   11/23/2015 colonoscopy with mild diverticulosis in the sigmoid colon otherwise normal.  Repeat not recommended  01/24/2021 EGD with LA grade a reflux esophagitis and benign-appearing esophageal stenosis which was dilated as well as Nissen fundoplication and gastritis.  Biopsy showed inflammation. 12/18/2021 CT of the abdomen pelvis without contrast done by general surgery for placement of ileal conduit, evaluate for periosteal hernia. Findings of a 12 mm focal wall thickening in the right lateral margin of the first portion of the duodenum. Also wall thickening of the distal rectum which may be due to incomplete distention or suggest infiltrative process in the wall. There was also a periosteal hernia containing short segment of small bowel loop without evidence of obstruction.  02/09/2022 had EGD, EUS and flex sig at the hospital with Dr. Carlean Purl for abnormal CT Flex sig completely unremarkable thought CT findings due to contraction EGD normal esophagus, chronic gastritis, normal duodenum no abnormality to correlate with thickened wall Due to unremarkable endoscopy no need for  further evaluation. 05/21/2022 [facility] for chest pain, mid LAD DES, normal LVEDP on Plavix  08/28/2022 went for second opinion with Dr. Oren Beckmann and would  like to come back here at [facility], had elevated TTG 6.1 on 03/27/22 and went to 2 on 08/28/22 recommended EGD but had MI so this was canceled.  Some improvement with gluten-free diet 10/22 to 09/08/22 [facility] admission for lower GI bleed, CTA positive for active hemorrhage terminal ileum anastomosis. Colonoscopy showed nonbleeding internal hemorrhoids, diverticulosis left colon no specimens collected. Dr. Gus Height, cardiologist, stopped the plavix after the hospital, stopped ASA yesterday.   11/28/22 HGB 8.3, 09/14/22 HGB 8.9, 09/08/2022 Hgb 10 05/18/22 HGB 14.5 MCV 70.3, platelets 468 States he can not tolerate iron, has not had IV iron.  Tested positive H pylori antibody but  treated 10 years ago.  Sed rate 36, B12 264. Pending iron, ferritin but with low MCV likely iron def.   He was off protonix for 24 hours, turned in H pylori stool but has been on PPI.  He is having blurry vision, having dizziness, hearing issues, nausea, palpitations, can not bend over without weakness or passing out. No chest pain.  Patient has small volume pink on the stool.  Denies melena, no gross blood since his hospitalization.  No GERD, he has had nausea without vomiting.  Has rectal burning and needing to have BM 3/4 mornings for years.  He has 7-8 stools a day, mostly after a BM, loose or hard stools, has a lot of bloating and gas.  Has been off gluten free diet x first of Dec.   He  reports that he has quit smoking. He has never used smokeless tobacco. He reports that he does not drink alcohol and does not use drugs.  Current Medications:    Current Outpatient Medications (Cardiovascular):    nitroGLYCERIN (NITROSTAT) 0.4 MG SL tablet, Place 1 tablet (0.4 mg total) under the tongue every 5 (five) minutes as needed for chest pain.  Current Outpatient Medications (Respiratory):    benzonatate (TESSALON PERLES) 100 MG capsule, Take 1 capsule (100 mg total) by mouth 3 (three) times daily as needed for  cough.   Camphor-Eucalyptus-Menthol (VICKS VAPORUB EX), Apply 1 Application topically daily as needed (congestion).  Current Outpatient Medications (Analgesics):    aspirin EC 81 MG tablet, Take 1 tablet (81 mg total) by mouth daily. Swallow whole.   traMADol (ULTRAM) 50 MG tablet, TAKE 1 TABLET BY MOUTH TWICE A DAY AS NEEDED   Current Outpatient Medications (Other):    amitriptyline (ELAVIL) 50 MG tablet,    diclofenac Sodium (VOLTAREN) 1 % GEL, Apply 1 Application topically 4 (four) times daily as needed (pain).   diphenoxylate-atropine (LOMOTIL) 2.5-0.025 MG tablet, Take 1 tablet by mouth 4 (four) times daily as needed for diarrhea or loose stools.   LORazepam (ATIVAN) 0.5 MG tablet, Take 0.5 tablets (0.25 mg total) by mouth 2 (two) times daily as needed for anxiety.   Omega-3 Fatty Acids (OMEGA-3 FISH OIL PO), Take 1 tablet by mouth daily.   pantoprazole (PROTONIX) 40 MG tablet, Take 40 mg by mouth daily.   Multiple Vitamin (MULTIVITAMIN ADULT PO), Take 1 tablet by mouth daily.   neomycin-polymyxin-hydrocortisone (CORTISPORIN) OTIC solution, Place 4 drops into the left ear 4 (four) times daily.   triamcinolone cream (KENALOG) 0.1 %, Apply 1 Application topically 2 (two) times daily as needed.  Medical History:  Past Medical History:  Diagnosis Date   Allergic rhinitis    Allergy  Anal fissure    Anemia    Anxiety    Blood transfusion without reported diagnosis    Cataract    starting   Coronary atherosclerosis of native coronary artery 2007   nonobstructive CAD by cath with 40% mid-LAD stenosis   Degenerative arthritis of knee, bilateral 10/09/2017   Depression    Diverticulosis 12/18/2011   Diverticulosis of colon with hemorrhage    April 2021, [facility] healthcare   Duodenal mass    Fibromyalgia    GERD with stricture    Helicobacter pylori (H. 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   Vitamin D deficiency 11/29/2017   Allergies:  Allergies  Allergen Reactions   Ambien [Zolpidem]     Unknown reaction   Bentyl [Dicyclomine]     Memory loss, anxiety, blurred vision   Carafate [Sucralfate] Nausea And Vomiting   Lansoprazole Diarrhea   Lexapro [Escitalopram Oxalate]     Unknown reaction    Other     Pt states he is sensitive to all oral medications   Pneumovax [Pneumococcal Polysaccharide Vaccine] Other (See Comments)    pain   Prilosec [Omeprazole] Other (See Comments)    Per patient joint pain with PPI's   Remeron [Mirtazapine]     Unknown reaction    Seroquel [Quetiapine] Other (See Comments)    Unknown reaction    Statins Other (See Comments)    Joint pain   Tizanidine Other (See Comments)    Made patient "feel crazy"   Tramadol Other (See Comments)    Pt can take small amounts up to twice daily but continued use causes chest / abdominal pain   Tylenol [Acetaminophen] Nausea And Vomiting    Can only take for 2 takes then he starts to have pain     Surgical History:  He  has a past surgical history that includes Nissen fundoplication (6761); Tonsillectomy and adenoidectomy (9509); Vasectomy; Knee arthroscopy; Transurethral resection of prostate; bladder distention; Shoulder arthroscopy (2010); Upper gastrointestinal endoscopy (05/13/2008); Catheter Removal; Colonoscopy; Interstim Implant placement; Interstim Implant removal; Dental surgery (Right); PROSTECTOMY; Robot assisted laparoscopic complete cystect ileal conduit; Total  knee arthroplasty (Right, 09/08/2021); Cardiac catheterization; LEFT HEART CATH AND CORONARY ANGIOGRAPHY (N/A, 05/29/2022); and CORONARY STENT INTERVENTION (N/A, 05/29/2022). Family History:  His family history includes Arthritis in his mother; Breast cancer in his sister; Colon polyps in his father; Diabetes in his father, paternal uncle, and sister; Heart disease in his father and mother; Hypertension in his father and mother; Liver cancer in his maternal grandfather and paternal uncle; Stroke in his mother.  REVIEW OF SYSTEMS  : All other systems reviewed and negative except where noted in the History of Present Illness.  PHYSICAL EXAM: BP 130/70   Pulse 86   Ht '5\' 7"'$  (1.702 m)   Wt 173 lb 6 oz (78.6 kg)   BMI 27.15 kg/m  General:   Pleasant, well developed  male in no acute distress Head:   Normocephalic and atraumatic. Eyes:  sclerae anicteric,conjunctive pink  Heart:   regular rate and rhythm, no murmurs or gallops Pulm:  Clear anteriorly; no wheezing Abdomen:   Soft, Obese AB, Active bowel sounds. No tenderness . , No organomegaly appreciated. Right healthy appearing stoma. Rectal: Not evaluated Extremities:  Without edema. Msk: Symmetrical without gross deformities. Peripheral pulses intact.  Neurologic:  Alert and  oriented x4;  No focal deficits.  Skin:   Dry and intact without significant lesions or rashes. Psychiatric:  Cooperative. Normal mood and affect.  RELEVANT LABS AND IMAGING: CBC    Component Value Date/Time   WBC 9.5 11/28/2022 1612   RBC 3.74 (L) 11/28/2022 1612   HGB 8.3 (L) 11/28/2022 1612   HGB 14.5 05/18/2022 1023   HCT 26.2 (L) 11/28/2022 1612   HCT 43.2 05/18/2022 1023   PLT 468.0 (H) 11/28/2022 1612   PLT 326 05/18/2022 1023   MCV 70.3 (L) 11/28/2022 1612   MCV 89 05/18/2022 1023   MCH 29.7 05/18/2022 1023   MCH 30.2 04/16/2022 1556   MCHC 31.5 11/28/2022 1612   RDW 19.0 (H) 11/28/2022 1612   RDW 12.7 05/18/2022 1023   LYMPHSABS 2.3 11/28/2022  1612   MONOABS 1.0 11/28/2022 1612   EOSABS 0.2 11/28/2022 1612   BASOSABS 0.1 11/28/2022 1612    CMP     Component Value Date/Time   NA 138 11/28/2022 1612   NA 139 05/18/2022 1023   K 4.8 11/28/2022 1612   CL 104 11/28/2022 1612   CO2 26 11/28/2022 1612   GLUCOSE 110 (H) 11/28/2022 1612   BUN 20 11/28/2022 1612   BUN 22 05/18/2022 1023   CREATININE 1.37 11/28/2022 1612   CALCIUM 9.1 11/28/2022 1612   PROT 7.1 11/28/2022 1612   ALBUMIN 4.2 11/28/2022 1612   AST 13 11/28/2022 1612   ALT 11 11/28/2022 1612   ALKPHOS 64 11/28/2022 1612   BILITOT 0.3 11/28/2022 1612   GFRNONAA 59 (L) 04/16/2022 1556   GFRAA 57 (L) 03/26/2017 Cornelia Darnell Stimson, PA-C 3:48 PM

## 2022-11-29 NOTE — Telephone Encounter (Signed)
Patient stated he has blood in his urostomy bag, his heart rate is elevated, and he cannot attend cardiac rehab due to weakness/dizziness. He stated he has a difficult time coping with these issues. He has OV with GI tomorrow. He wants to know if he can stop taking ASA. He has appointment with Dr,. O'Neal on 1/25. Please advise.

## 2022-11-30 ENCOUNTER — Telehealth: Payer: Self-pay | Admitting: Internal Medicine

## 2022-11-30 ENCOUNTER — Encounter: Payer: Self-pay | Admitting: Physician Assistant

## 2022-11-30 ENCOUNTER — Ambulatory Visit: Payer: Medicare Other | Admitting: Physician Assistant

## 2022-11-30 ENCOUNTER — Telehealth: Payer: Self-pay

## 2022-11-30 VITALS — BP 130/70 | HR 86 | Ht 67.0 in | Wt 173.4 lb

## 2022-11-30 DIAGNOSIS — Z9889 Other specified postprocedural states: Secondary | ICD-10-CM

## 2022-11-30 DIAGNOSIS — I25118 Atherosclerotic heart disease of native coronary artery with other forms of angina pectoris: Secondary | ICD-10-CM

## 2022-11-30 DIAGNOSIS — D649 Anemia, unspecified: Secondary | ICD-10-CM | POA: Diagnosis not present

## 2022-11-30 DIAGNOSIS — K219 Gastro-esophageal reflux disease without esophagitis: Secondary | ICD-10-CM | POA: Diagnosis not present

## 2022-11-30 DIAGNOSIS — Z8719 Personal history of other diseases of the digestive system: Secondary | ICD-10-CM

## 2022-11-30 NOTE — Patient Instructions (Addendum)
_______________________________________________________  If your blood pressure at your visit was 140/90 or greater, please contact your primary care physician to follow up on this.  _______________________________________________________  If you are age 76 or older, your body mass index should be between 23-30. Your Body mass index is 27.15 kg/m. If this is out of the aforementioned range listed, please consider follow up with your Primary Care Provider.  If you are age 59 or younger, your body mass index should be between 19-25. Your Body mass index is 27.15 kg/m. If this is out of the aformentioned range listed, please consider follow up with your Primary Care Provider.   ________________________________________________________  The [facility] providers would like to encourage you to use [facility] to communicate with providers for non-urgent requests or questions.  Due to long hold times on the telephone, sending your provider a message by [facility] may be a faster and more efficient way to get a response.  Please allow 48 business hours for a response.  Please remember that this is for non-urgent requests.  _______________________________________________________  Dennis Bast have been scheduled for an endoscopy. Please follow written instructions given to you at your visit today. If you use inhalers (even only as needed), please bring them with you on the day of your procedure.   Due to recent changes in healthcare laws, you may see the results of your imaging and laboratory studies on MyChart before your provider has had a chance to review them.  We understand that in some cases there may be results that are confusing or concerning to you. Not all laboratory results come back in the same time frame and the provider may be waiting for multiple results in order to interpret others.  Please give [name] 48 hours in order for your provider to thoroughly review all the results before contacting the office for  clarification of your results.    We have sent a referral to Hematology, this has been requested as urgent. See their contact information below.  [facility] at [address], [place] [address] [phone]  Go to the ER if you have any severe weakness, severe AB pain, vomit blood, dark red blood in your bowel movement, shortenss of breath or chest pain.   Please take your proton pump inhibitor medication, start twice a day  Please take this medication 30 minutes to 1 hour before meals- this makes it more effective.  Avoid spicy and acidic foods Avoid fatty foods Limit your intake of coffee, tea, alcohol, and carbonated drinks Work to maintain a healthy weight Keep the head of the bed elevated at least 3 inches with blocks or a wedge pillow if you are having any nighttime symptoms Stay upright for 2 hours after eating Avoid meals and snacks three to four hours before bedtime

## 2022-11-30 NOTE — Telephone Encounter (Signed)
Patient called wanting to know if Dr. Jenny Reichmann could put an urgency on him getting scheduled with an oncologist. Patient stated that his blood is low and wants to try and see if he can get seen as soon as  possible. Patient stated that he has an appt with Gastro scheduled already but he needs the oncology appt order to be placed in his chart as stat. Best callback number for patient is [phone].

## 2022-11-30 NOTE — Telephone Encounter (Signed)
[facility] Pre-operative Risk Assessment     Request for surgical clearance:     Endoscopy Procedure  What type of surgery is being performed?    EGD  When is this surgery scheduled?    12-17-2022  What type of clearance is required ?   General clearance for anesthesia.   Are there any medications that need to be held prior to surgery and how long? No  Practice name and name of physician performing surgery?      [facility]  What is your office phone and fax number?      Phone- [phone]  [phone]  Anesthesia type (None, local, MAC, general) ?       MAC

## 2022-11-30 NOTE — Telephone Encounter (Signed)
Patient called back and said he is wanting an infusion done asap. He would also like a call back to let him know what's going on asap at his Home or Cell phone

## 2022-12-03 DIAGNOSIS — I25118 Atherosclerotic heart disease of native coronary artery with other forms of angina pectoris: Secondary | ICD-10-CM | POA: Diagnosis not present

## 2022-12-03 DIAGNOSIS — R002 Palpitations: Secondary | ICD-10-CM | POA: Diagnosis not present

## 2022-12-03 NOTE — Telephone Encounter (Signed)
Reviewed referral, patient scheduled

## 2022-12-03 NOTE — Telephone Encounter (Signed)
   Name: RICKIE GANGE Sr.  DOB: 28-Jul-1946  MRN: [id number]  Primary Cardiologist: Evalina Field, MD  Chart reviewed as part of pre-operative protocol coverage. The patient has an upcoming visit scheduled with Dr. Audie Box on 12/06/2022 at which time clearance can be addressed in case there are any issues that would impact surgical recommendations.  EGD is not scheduled until 12/17/2022 as below. I added preop FYI to appointment note so that provider is aware to address at time of outpatient visit.  Per office protocol the cardiology provider should forward their finalized clearance decision and recommendations regarding antiplatelet therapy to the requesting party below.    I will route this message as FYI to requesting party and remove this message from the preop box as separate preop APP input not needed at this time.   Please call with any questions.  Lenna Sciara, NP  12/03/2022, 2:08 PM

## 2022-12-04 ENCOUNTER — Encounter: Payer: Self-pay | Admitting: Allergy & Immunology

## 2022-12-04 ENCOUNTER — Other Ambulatory Visit: Payer: Self-pay

## 2022-12-04 ENCOUNTER — Ambulatory Visit (INDEPENDENT_AMBULATORY_CARE_PROVIDER_SITE_OTHER): Payer: Medicare Other | Admitting: Allergy & Immunology

## 2022-12-04 ENCOUNTER — Telehealth (HOSPITAL_BASED_OUTPATIENT_CLINIC_OR_DEPARTMENT_OTHER): Payer: Self-pay

## 2022-12-04 VITALS — BP 124/66 | HR 75 | Temp 98.5°F | Resp 18 | Ht 67.0 in | Wt 172.5 lb

## 2022-12-04 DIAGNOSIS — K1370 Unspecified lesions of oral mucosa: Secondary | ICD-10-CM | POA: Diagnosis not present

## 2022-12-04 DIAGNOSIS — D500 Iron deficiency anemia secondary to blood loss (chronic): Secondary | ICD-10-CM

## 2022-12-04 DIAGNOSIS — R053 Chronic cough: Secondary | ICD-10-CM | POA: Diagnosis not present

## 2022-12-04 DIAGNOSIS — J3089 Other allergic rhinitis: Secondary | ICD-10-CM

## 2022-12-04 DIAGNOSIS — J310 Chronic rhinitis: Secondary | ICD-10-CM

## 2022-12-04 NOTE — Telephone Encounter (Addendum)
Left message for patient to call back    ----- Message from Loel Dubonnet, NP sent at 12/04/2022  4:31 PM EST ----- Monitor with predominantly normal sinus rhythm with average heart rate of 74 bpm.  There were 3 episodes of a fast heartbeat in the top chamber of the heart called supraventricular tachycardia.  The longest episode of this was 6.3 seconds.  He did press the button during one of these episodes.  Otherwise his triggered events were associated with normal sinus rhythm or sinus tachycardia.  Results are overall reassuring.  If still noticing significant palpitations could trial metoprolol tartrate 25 mg as needed.  Otherwise follow-up as scheduled with Dr. Audie Box.

## 2022-12-04 NOTE — Progress Notes (Unsigned)
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 Chronic rhinitis with a chronic cough - Testing was only positive to a grass, otherwise negative.  - Copy of testing results provided. - I do not think that this will explain your symptoms. - Continue with the medications as you are doing to help prevent this sensation. - I hope the cardiologist will be able to provide some support.   2. Mouth lesion - Schedule patch testing on a Monday (these are read on Wednesdays and Fridays as well). - This will look for a metal allergy which should guide your dental work.   3. Follow up next available for patch testing placement.    Please inform us of any Emergency Department visits, hospitalizations, or changes in symptoms. Call us before going to the ED for breathing or allergy symptoms since we might be able to fit you in for a sick visit. Feel free to contact us anytime with any questions, problems, or concerns.  It was a pleasure to meet you and your wife today!  Websites that have reliable patient information: 1. American Academy of Asthma, Allergy, and Immunology: www.aaaai.org 2. Food Allergy Research and Education (FARE): foodallergy.org 3. Mothers of Asthmatics: http://www.asthmacommunitynetwork.org 4. American College of Allergy, Asthma, and Immunology: www.acaai.org   COVID-19 Vaccine Information can be found at: [email or web address].co.uk For questions related to vaccine distribution or appointments, please email [email or web address] or call [phone].   We realize that you  might be concerned about having an allergic reaction to the COVID19 vaccines. To help with that concern, WE ARE OFFERING THE COVID19 VACCINES IN OUR OFFICE! Ask the front desk for dates!     "Like" [name] on Facebook and Instagram for our latest updates!      A healthy democracy works best when [employer] participate! Make sure you are registered to vote! If you have moved or changed any of your contact information, you will need to get this updated before voting!  In some cases, you MAY be able to register to vote online: [email or web address]      True Test looks for the following sensitivities:     We will also do a metal series as well.    Airborne Adult Perc - 12/04/22 1433     Time Antigen Placed 1434    Allergen Manufacturer Lavella Hammock    Location Back    Number of Test 59    Panel 1 Select    1. Control-Buffer 50% Glycerol Negative    2. Control-Histamine 1 mg/ml 2+    3. Albumin saline Negative    4. Manly Negative    5. [place] Negative    6. Johnson Negative    7. [place] Blue Negative    8. Meadow Fescue Negative    9. Perennial Rye Negative    10. Sweet Vernal Negative    11. Timothy 2+    12. Cocklebur Negative    13. Burweed Marshelder Negative    14. Ragweed, short Negative    15. Ragweed, Giant Negative    16. Plantain,  English Negative    17. Lamb's Quarters Negative    18. Sheep  Sorrell Negative    19. Rough Pigweed Negative    20. Marsh Elder, Rough Negative    21. Mugwort, Common Negative    22. Ash mix Negative    23. Birch mix Negative    24. Beech American Negative    25. Box, Elder Negative    26. Cedar, red Negative    27. Cottonwood, [place] Negative    28. Elm mix Negative    29. Hickory Negative    30. Maple mix Negative    31. Oak, [place] mix Negative    32. Pecan Pollen Negative    33. Pine mix Negative    34. Sycamore Eastern Negative    35. Ona, Black Pollen Negative    36. Alternaria alternata  Negative    37. Cladosporium Herbarum Negative    38. Aspergillus mix Negative    39. Penicillium mix Negative    40. Bipolaris sorokiniana (Helminthosporium) Negative    41. Drechslera spicifera (Curvularia) Negative    42. Mucor plumbeus Negative    43. Fusarium moniliforme Negative    44. Aureobasidium pullulans (pullulara) Negative    45. Rhizopus oryzae Negative    46. Botrytis cinera Negative    47. Epicoccum nigrum Negative    48. Phoma betae Negative    49. Candida Albicans Negative    50. Trichophyton mentagrophytes Negative    51. Mite, D Farinae  5,000 AU/ml Negative    52. Mite, D Pteronyssinus  5,000 AU/ml Negative    53. Cat Hair 10,000 BAU/ml Negative    54.  Dog Epithelia Negative    55. Mixed Feathers Negative    56. Horse Epithelia Negative    57. Cockroach, German Negative    58. Mouse Negative    59. Tobacco Leaf Negative             Intradermal - 12/04/22 1501     Time Antigen Placed 1400    Allergen Manufacturer Greer    Location Arm    Number of Test 14    Control Negative    [place] Negative    Johnson Negative    Ragweed mix Negative    Weed mix Negative    Tree mix Negative    Mold 1 Negative    Mold 2 Negative    Mold 3 Negative    Mold 4 Negative    Cat Negative    Dog Negative    Cockroach Negative    Mite mix Negative                     {Blank single:19197::"This note in its entirety was forwarded to the Provider who requested this consultation."}  Subjective:   Erik Lolling Sr. is a 77 y.o. male presenting today for evaluation of  Chief Complaint  Patient presents with   Allergy Testing   Cough   Rash    Erik Lolling Sr. 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   Pain in joint of right knee 08/28/2021   Generalized abdominal pain 04/22/2021   Iron deficiency anemia due to  chronic blood loss 03/23/2021   Anxiety 03/19/2021   Paresthesia of hand, bilateral 11/27/2020   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   Confusion 11/10/2020   COVID-19 virus infection 10/02/2020   Anemia 03/03/2020   S/P Nissen fundoplication (without gastrostomy tube) procedure 02/21/2020   Gastrointestinal hemorrhage with melena 02/18/2020   Diarrhea 12/14/2019   Stitch granuloma 08/06/2019   History of prostatectomy 06/12/2019   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   History of total cystectomy 06/11/2019   Cervical radiculopathy 04/11/2018   Bilateral leg pain 02/25/2018   Pain in joint of left shoulder 12/10/2017   Pelvic floor dysfunction 11/29/2017   Vitamin D deficiency 11/29/2017   CAD (coronary artery disease) 10/29/2017   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   Elevated PSA 10/09/2017   Degenerative arthritis of knee, bilateral 10/09/2017   Senile purpura ([place]) 10/14/2016   Medication intolerances 06/26/2016   Dyspepsia 06/26/2016   BPH (benign prostatic hypertrophy) with urinary obstruction 03/30/2016   Abnormal CT of the abdomen 11/10/2015   Right leg pain 09/01/2015   Bilateral shoulder pain 09/01/2015   Fatigue 06/28/2015   Bladder pain 03/25/2015   Polyarthralgia 02/02/2015   Abdominal pain, epigastric 02/01/2015   Bloating 02/01/2015   Bradycardia with 31 - 40 beats per minute 06/18/2014   Chest pain 03/16/2013   Hyperlipidemia 02/18/2013   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   Encounter for well adult exam with abnormal findings 02/23/2012   Allergic rhinitis    Insomnia    Diverticulitis    GERD with stricture    Helicobacter pylori gastritis    Internal and external hemorrhoids without  complication    Depression 10/15/2011   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  Erik Lolling Sr. was referred by [name], MD.     [name] is a 77 y.o. male presenting for {Blank single:19197::"a food challenge","a drug challenge","skin testing","a sick visit","an evaluation of ***","a follow up visit"}.   Asthma/Respiratory Symptom History: He does have a problems with shortness of breath. He is having tightness in his throat at night.  Every day around 7pm, he has issues when he feels that he needs to cough something. This is every day. This started years ago. It does not really change when he is around particular environment. It does get worse when he is around [place] to his [place]. He does have thydroxyzine that he takes at night. This has been notable for six months.   {Blank single:19197::"Allergic Rhinitis Symptom History: ***"," "}  {Blank single:19197::"Food Allergy Symptom History: ***"," "}  {Blank single:19197::"Skin Symptom History: ***"," "}  GERD Symptom History: He has a history of reflux and is on Protonix.   He is having some issues with a tooth. He has a rash on the roof of his mouth bilaterally. It has been painful. This Is in the 3 position in his mouth. The crown was placed 16 years ago. Detist now is Dr. Cain Saupe. He has no idea what is in the mouth metal wise. He   ***Otherwise, there is no history of other atopic diseases, including {Blank multiple:19196:o:"asthma","food allergies","drug allergies","environmental allergies","stinging insect allergies","eczema","urticaria","contact dermatitis"}. There is no significant infectious history. ***Vaccinations are up to date.    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   Pain in joint of right knee 08/28/2021   Generalized abdominal pain 04/22/2021   Iron deficiency anemia due to chronic blood loss 03/23/2021   Anxiety 03/19/2021   Paresthesia of hand, bilateral 11/27/2020   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   Confusion 11/10/2020   COVID-19 virus infection 10/02/2020   Anemia 03/03/2020   S/P Nissen fundoplication (without gastrostomy tube) procedure 02/21/2020   Gastrointestinal hemorrhage with melena 02/18/2020   Diarrhea 12/14/2019   Stitch granuloma 08/06/2019   History of prostatectomy 06/12/2019   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   History of total cystectomy 06/11/2019   Cervical radiculopathy 04/11/2018   Bilateral leg pain 02/25/2018   Pain in joint of left shoulder 12/10/2017   Pelvic floor dysfunction 11/29/2017   Vitamin D deficiency 11/29/2017   CAD (coronary artery disease) 10/29/2017   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   Elevated PSA 10/09/2017   Degenerative arthritis of knee, bilateral 10/09/2017   Senile purpura ([place]) 10/14/2016   Medication intolerances 06/26/2016   Dyspepsia 06/26/2016   BPH (benign prostatic hypertrophy) with urinary obstruction 03/30/2016   Abnormal CT of the abdomen 11/10/2015   Right leg pain 09/01/2015   Bilateral shoulder pain 09/01/2015   Fatigue 06/28/2015   Bladder pain 03/25/2015   Polyarthralgia 02/02/2015   Abdominal pain, epigastric 02/01/2015   Bloating 02/01/2015   Bradycardia with 31 - 40 beats per minute 06/18/2014   Chest pain 03/16/2013   Hyperlipidemia  02/18/2013   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   Encounter for well adult exam with abnormal findings 02/23/2012   Allergic rhinitis    Insomnia    Diverticulitis    GERD with stricture    Helicobacter pylori gastritis    Internal and external hemorrhoids without complication    Depression 10/15/2011   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   Irritable bowel syndrome 06/09/2010   Interstitial cystitis 06/09/2010   Fibromyalgia 06/09/2010   FLATULENCE-GAS-BLOATING 06/09/2010    Medication List:  Allergies as of 12/04/2022       Reactions   Ambien [zolpidem]    Unknown reaction   Bentyl [dicyclomine]    Memory loss, anxiety, blurred vision   Carafate [sucralfate] Nausea And Vomiting   Lansoprazole Diarrhea   Lexapro [escitalopram Oxalate]    Unknown reaction    Other    Pt states he is sensitive to all oral medications   Pneumovax [pneumococcal Polysaccharide Vaccine] Other (See Comments)   pain   Prilosec [omeprazole] Other (See Comments)   Per patient joint pain with PPI's   Remeron [mirtazapine]    Unknown reaction    Seroquel [quetiapine] Other (See Comments)   Unknown reaction    Statins Other (See Comments)   Joint pain   Tizanidine Other (See Comments)   Made patient "feel crazy"   Tramadol Other (See Comments)   Pt can take small amounts up to twice daily but continued use causes chest / abdominal pain   Tylenol [acetaminophen] Nausea And Vomiting   Can only take for 2 takes then he starts to have pain        Medication List  Accurate as of December 04, 2022  4:11 PM. If you have any questions, ask your nurse or doctor.          STOP taking these medications    MULTIVITAMIN ADULT PO Stopped by: Valentina Shaggy, MD   neomycin-polymyxin-hydrocortisone OTIC solution Commonly known as: CORTISPORIN Stopped by: Valentina Shaggy, MD   pantoprazole 40 MG tablet Commonly known as: PROTONIX Stopped by: Valentina Shaggy, MD   triamcinolone cream 0.1 % Commonly known as: KENALOG Stopped by: Valentina Shaggy, MD       TAKE these medications    amitriptyline 50 MG tablet Commonly known as: ELAVIL   aspirin EC 81 MG tablet Take 1 tablet (81 mg total) by mouth daily. Swallow whole.   benzonatate 100 MG capsule Commonly known as: Tessalon Perles Take 1 capsule (100 mg total) by mouth 3 (three) times daily as needed for cough.   diclofenac Sodium 1 % Gel Commonly known as: VOLTAREN Apply 1 Application topically 4 (four) times daily as needed (pain).   diphenoxylate-atropine 2.5-0.025 MG tablet Commonly known as: LOMOTIL Take 1 tablet by mouth 4 (four) times daily as needed for diarrhea or loose stools.   LORazepam 0.5 MG tablet Commonly known as: Ativan Take 0.5 tablets (0.25 mg total) by mouth 2 (two) times daily as needed for anxiety.   nitroGLYCERIN 0.4 MG SL tablet Commonly known as: NITROSTAT Place 1 tablet (0.4 mg total) under the tongue every 5 (five) minutes as needed for chest pain.   OMEGA-3 FISH OIL PO Take 1 tablet by mouth daily.   traMADol 50 MG tablet Commonly known as: ULTRAM TAKE 1 TABLET BY MOUTH TWICE A DAY AS NEEDED   VICKS VAPORUB EX Apply 1 Application topically daily as needed (congestion).        Birth History: {Blank single:19197::"non-contributory","born premature and spent time in the NICU","born at term without complications"}  Developmental History: Homar has met all milestones on time. He has required no {Blank multiple:19196:a:"speech therapy","occupational therapy","physical therapy"}. ***non-contributory  Past Surgical History: Past Surgical History:  Procedure Laterality Date   ADENOIDECTOMY     bladder distention     x 6   CARDIAC CATHETERIZATION     CATHETER REMOVAL     super pubic area   COLONOSCOPY     CORONARY STENT INTERVENTION N/A 05/29/2022   Procedure: CORONARY STENT INTERVENTION;  Surgeon: Martinique, Peter M, MD;   Location: [facility];  Service: Cardiovascular;  Laterality: N/A;   DENTAL SURGERY Right    #30 tooth extraction (right upper)   INTERSTIM IMPLANT PLACEMENT     INTERSTIM IMPLANT REMOVAL     KNEE ARTHROSCOPY     right x 2   LEFT HEART CATH AND CORONARY ANGIOGRAPHY N/A 05/29/2022   Procedure: LEFT HEART CATH AND CORONARY ANGIOGRAPHY;  Surgeon: Martinique, Peter M, MD;  Location: [facility];  Service: Cardiovascular;  Laterality: N/A;   NISSEN FUNDOPLICATION  0175   PROSTECTOMY     ROBOT ASSISTED LAPAROSCOPIC COMPLETE CYSTECT ILEAL CONDUIT     SHOULDER ARTHROSCOPY  2010   left   TONSILLECTOMY AND ADENOIDECTOMY  1957   TOTAL KNEE ARTHROPLASTY Right 09/08/2021   Procedure: TOTAL KNEE ARTHROPLASTY;  Surgeon: Susa Day, MD;  Location: [facility];  Service: Orthopedics;  Laterality: Right;   TRANSURETHRAL RESECTION OF PROSTATE     x 2   UPPER GASTROINTESTINAL ENDOSCOPY  05/13/2008   hiatal hernia   VASECTOMY  Family History: Family History  Problem Relation Age of Onset   Allergic rhinitis Mother    Heart disease Mother    Hypertension Mother    Stroke Mother    Arthritis Mother    Colon polyps Father    Diabetes Father    Heart disease Father    Hypertension Father    Diabetes Sister    Breast cancer Sister    Diabetes Paternal Uncle    Liver cancer Paternal Uncle    Liver cancer Maternal Grandfather    Colon cancer Neg Hx    Esophageal cancer Neg Hx    Rectal cancer Neg Hx    Stomach cancer Neg Hx    Pancreatic cancer Neg Hx      Social History: Emiliano lives at home with ***.  He lives in a house that is 76 years old.  There is Oaks throughout the home.  They have gas heating and central cooling.  There is a dog and a cat inside of the house.  There are no dust mite covers on the bedding.  He does use e-cigarettes.  There is a HEPA filter in the home.  He does have exposure to fumes, chemicals, and dust.  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 rate 18, height '5\' 7"'$  (1.702 m), weight 172 lb 8 oz (78.2 kg), SpO2 98 %. Body mass index is 27.02 kg/m.     Physical Exam   Diagnostic studies: {Blank single:19197::"none","deferred due to recent antihistamine use","labs sent instead"," "}  Spirometry: {Blank single:19197::"results normal (FEV1: ***%, FVC: ***%, FEV1/FVC: ***%)","results abnormal (FEV1: ***%, FVC: ***%, FEV1/FVC: ***%)"}.    {Blank single:19197::"Spirometry consistent with mild obstructive disease","Spirometry consistent with moderate obstructive disease","Spirometry consistent with severe obstructive disease","Spirometry consistent with possible restrictive disease","Spirometry consistent with mixed obstructive and restrictive disease","Spirometry uninterpretable due to technique","Spirometry consistent with normal pattern"}. {Blank single:19197::"Albuterol/Atrovent nebulizer","Xopenex/Atrovent nebulizer","Albuterol nebulizer","Albuterol four puffs via MDI","Xopenex four puffs via MDI"} treatment given in clinic with {Blank single:19197::"significant improvement in FEV1 per ATS criteria","significant improvement in FVC per ATS criteria","significant improvement in FEV1 and FVC per ATS criteria","improvement in FEV1, but not significant per ATS criteria","improvement in FVC, but not significant per ATS criteria","improvement in FEV1 and FVC, but not significant per ATS criteria","no improvement"}.  Allergy Studies: {Blank single:19197::"none","labs sent instead"," "}   Airborne Adult Perc - 12/04/22 1433     Time Antigen Placed 1434    Allergen Manufacturer Lavella Hammock    Location Back    Number of Test 59    Panel 1 Select    1. Control-Buffer 50% Glycerol Negative    2. Control-Histamine 1 mg/ml 2+    3. Albumin saline Negative    4. [place] Negative    5. [place] Negative    6.  Johnson Negative    7. [place] Blue Negative    8. Meadow Fescue Negative    9. Perennial Rye Negative    10. Sweet Vernal Negative    11. Timothy 2+    12. Cocklebur Negative    13. Burweed Marshelder Negative    14. Ragweed, short Negative    15. Ragweed, Giant Negative    16. Plantain,  English Negative    17. Lamb's Quarters Negative    18. Sheep Sorrell Negative    19. Rough Pigweed Negative    20. [place] Elder, Rough Negative  21. Mugwort, Common Negative    22. Ash mix Negative    23. Birch mix Negative    24. Beech American Negative    25. Box, Elder Negative    26. Cedar, red Negative    27. Cottonwood, [place] Negative    28. Elm mix Negative    29. Hickory Negative    30. Maple mix Negative    31. Oak, [place] mix Negative    32. Pecan Pollen Negative    33. Pine mix Negative    34. Sycamore Eastern Negative    35. [place], Black Pollen Negative    36. Alternaria alternata Negative    37. Cladosporium Herbarum Negative    38. Aspergillus mix Negative    39. Penicillium mix Negative    40. Bipolaris sorokiniana (Helminthosporium) Negative    41. Drechslera spicifera (Curvularia) Negative    42. Mucor plumbeus Negative    43. Fusarium moniliforme Negative    44. Aureobasidium pullulans (pullulara) Negative    45. Rhizopus oryzae Negative    46. Botrytis cinera Negative    47. Epicoccum nigrum Negative    48. Phoma betae Negative    49. Candida Albicans Negative    50. Trichophyton mentagrophytes Negative    51. Mite, D Farinae  5,000 AU/ml Negative    52. Mite, D Pteronyssinus  5,000 AU/ml Negative    53. Cat Hair 10,000 BAU/ml Negative    54.  Dog Epithelia Negative    55. Mixed Feathers Negative    56. Horse Epithelia Negative    57. Cockroach, German Negative    58. Mouse Negative    59. Tobacco Leaf Negative             Intradermal - 12/04/22 1501     Time Antigen Placed 1400    Allergen Manufacturer Greer    Location Arm    Number of  Test 14    Control Negative    [place] Negative    Johnson Negative    Ragweed mix Negative    Weed mix Negative    Tree mix Negative    Mold 1 Negative    Mold 2 Negative    Mold 3 Negative    Mold 4 Negative    Cat Negative    Dog Negative    Cockroach Negative    Mite mix Negative             {Blank single:19197::"Allergy testing results were read and interpreted by myself, documented by clinical staff."," "}         Salvatore Marvel, MD [facility] [place] [facility] [place]

## 2022-12-04 NOTE — Progress Notes (Unsigned)
Cardiology Office Note:   Date:  12/06/2022  NAME:  Erik SIGG Sr.    MRN: [id number] DOB:  1946/07/15   PCP:  [name], MD  Cardiologist:  Evalina Field, MD  Electrophysiologist:  None   Referring MD: Maggie Schwalbe, PA-C   Chief Complaint  Patient presents with   Follow-up   History of Present Illness:   Erik Lolling Sr. is a 77 y.o. male with a hx of CAD s/p PCI, GI bleed, SVT who presents for follow-up.  He reports he is having shortness of breath and palpitations with activity.  Recent monitor shows brief SVT.  He is now anemic.  Hemoglobin 7.9.  GI needs to reevaluate him.  He has stopped aspirin due to GI bleed and hemoglobin that is downtrending.  He denies any significant chest pain.  Examination normal.  We discussed trying him on Repatha to get his LDL cholesterol low.  He will not be able to tolerate antiplatelet agents at this time.  His blood pressure is well-controlled.  He is frustrated with recurrent GI bleed.  From my standpoint GI can proceed as necessary.  Problem List CAD -70-99% pLAD; CT FFR 0.57 -PCI pLAD 05/28/2022 2. HTN 3. HLD -Tchol 238, HDL 47, LDL 153, TG 185 -Statin intolerant 4.  Gastritis/recurrent GI bleed -Massive GI bleed [facility] in 2021 -EGD 01/2022 with gastritis -GI bleed 08/2022 -> [facility] (no source identified) 5. SVT -brief episodes   Past Medical History: Past Medical History:  Diagnosis Date   Allergic rhinitis    Allergy    Anal fissure    Anemia    Anxiety    Blood transfusion without reported diagnosis    Cataract    starting   Coronary atherosclerosis of native coronary artery 2007   nonobstructive CAD by cath with 40% mid-LAD stenosis   Degenerative arthritis of knee, bilateral 10/09/2017   Depression    Diverticulosis 12/18/2011   Diverticulosis of colon with hemorrhage    April 2021, [facility] healthcare   Duodenal mass    Fibromyalgia    GERD with stricture    Helicobacter pylori (H. 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   Vitamin D deficiency 11/29/2017    Past Surgical History: Past Surgical History:  Procedure Laterality Date   ADENOIDECTOMY     bladder distention     x 6   CARDIAC CATHETERIZATION     CATHETER REMOVAL     super pubic area   COLONOSCOPY     CORONARY STENT INTERVENTION N/A 05/29/2022   Procedure: CORONARY STENT INTERVENTION;  Surgeon: Martinique, Peter M, MD;  Location: [place] [facility];  Service: Cardiovascular;  Laterality: N/A;   DENTAL SURGERY Right    #30 tooth extraction (right upper)   INTERSTIM IMPLANT PLACEMENT     INTERSTIM IMPLANT REMOVAL     KNEE ARTHROSCOPY  right x 2   LEFT HEART CATH AND CORONARY ANGIOGRAPHY N/A 05/29/2022   Procedure: LEFT HEART CATH AND CORONARY ANGIOGRAPHY;  Surgeon: Martinique, Peter M, MD;  Location: [place] [facility];  Service: Cardiovascular;  Laterality: N/A;   NISSEN FUNDOPLICATION  9379   PROSTECTOMY     ROBOT ASSISTED LAPAROSCOPIC COMPLETE CYSTECT ILEAL CONDUIT     SHOULDER ARTHROSCOPY  2010   left   TONSILLECTOMY AND ADENOIDECTOMY  1957   TOTAL KNEE ARTHROPLASTY Right 09/08/2021   Procedure: TOTAL KNEE ARTHROPLASTY;  Surgeon: Susa Day, MD;  Location: [facility];  Service: Orthopedics;  Laterality: Right;   TRANSURETHRAL RESECTION OF PROSTATE     x 2   UPPER GASTROINTESTINAL ENDOSCOPY  05/13/2008   hiatal hernia   VASECTOMY      Current Medications: Current Meds  Medication Sig    amitriptyline (ELAVIL) 50 MG tablet as needed for sleep.   benzonatate (TESSALON PERLES) 100 MG capsule Take 1 capsule (100 mg total) by mouth 3 (three) times daily as needed for cough.   Camphor-Eucalyptus-Menthol (VICKS VAPORUB EX) Apply 1 Application topically daily as needed (congestion).   diclofenac Sodium (VOLTAREN) 1 % GEL Apply 1 Application topically 4 (four) times daily as needed (pain).   diphenoxylate-atropine (LOMOTIL) 2.5-0.025 MG tablet Take 1 tablet by mouth 4 (four) times daily as needed for diarrhea or loose stools.   LORazepam (ATIVAN) 0.5 MG tablet Take 0.5 tablets (0.25 mg total) by mouth 2 (two) times daily as needed for anxiety. (Patient taking differently: Take 0.25 mg by mouth daily at 6 (six) AM.)   nitroGLYCERIN (NITROSTAT) 0.4 MG SL tablet Place 1 tablet (0.4 mg total) under the tongue every 5 (five) minutes as needed for chest pain.   traMADol (ULTRAM) 50 MG tablet TAKE 1 TABLET BY MOUTH TWICE A DAY AS NEEDED     Allergies:    Ambien [zolpidem], Bentyl [dicyclomine], Carafate [sucralfate], Lansoprazole, Lexapro [escitalopram oxalate], Other, Pneumovax [pneumococcal polysaccharide vaccine], Prilosec [omeprazole], Remeron [mirtazapine], Seroquel [quetiapine], Statins, Tizanidine, Tramadol, and Tylenol [acetaminophen]   Social History: Social History   Socioeconomic History   Marital status: Married    Spouse name: Not on file   Number of children: 4   Years of education: Not on file   Highest education level: Not on file  Occupational History   Occupation: retired  Tobacco Use   Smoking status: Former    Passive exposure: Past   Smokeless tobacco: Never   Tobacco comments:    quit 1980  Vaping Use   Vaping Use: Never used  Substance and Sexual Activity   Alcohol use: No    Alcohol/week: 0.0 standard drinks of alcohol   Drug use: No   Sexual activity: Yes    Partners: Female  Other Topics Concern   Not on file  Social History Narrative   Married,  retired for children   He is a former smoker no alcohol or substance use   Social Determinants of Radio broadcast assistant Strain: Low Risk  (12/08/2021)   Overall Financial Resource Strain (CARDIA)    Difficulty of Paying Living Expenses: Not hard at all  Food Insecurity: No Food Insecurity (12/08/2021)   Hunger Vital Sign    Worried About Running Out of Food in the Last Year: Never true    Ran Out of Food in the Last Year: Never true  Transportation Needs: No Transportation Needs (12/08/2021)   PRAPARE - Hydrologist (Medical): No  Lack of Transportation (Non-Medical): No  Physical Activity: Insufficiently Active (08/28/2022)   Exercise Vital Sign    Days of Exercise per Week: 2 days    Minutes of Exercise per Session: 10 min  Stress: Stress Concern Present (09/11/2022)   [place]    Feeling of Stress : Rather much  Social Connections: Moderately Integrated (12/08/2021)   Social Connection and Isolation Panel [NHANES]    Frequency of Communication with Friends and Family: Three times a week    Frequency of Social Gatherings with Friends and Family: Three times a week    Attends Religious Services: More than 4 times per year    Active Member of Clubs or Organizations: No    Attends Archivist Meetings: Never    Marital Status: Married     Family History: The patient's family history includes Allergic rhinitis in his mother; Arthritis in his mother; Breast cancer in his sister; Colon polyps in his father; Diabetes in his father, paternal uncle, and sister; Heart disease in his father and mother; Hypertension in his father and mother; Liver cancer in his maternal grandfather and paternal uncle; Stroke in his mother. There is no history of Colon cancer, Esophageal cancer, Rectal cancer, Stomach cancer, or Pancreatic cancer.  ROS:   All other ROS reviewed and negative.  Pertinent positives noted in the HPI.     EKGs/Labs/Other Studies Reviewed:   The following studies were personally reviewed by me today:  [facility] 05/29/2022  Single vessel obstructive CAD Normal LVEDP Successful PCI of the proximal to mid LAD with OCT guidance and DES x 1  Recent Labs: 11/28/2022: ALT 11; BUN 20; Creatinine, Ser 1.37; Magnesium 2.0; Potassium 4.8; Sodium 138; TSH 2.46 12/05/2022: Hemoglobin 7.9; Platelet Count 435   Recent Lipid Panel    Component Value Date/Time   CHOL 170 11/28/2022 1612   TRIG 167.0 (H) 11/28/2022 1612   HDL 42.00 11/28/2022 1612   CHOLHDL 4 11/28/2022 1612   VLDL 33.4 11/28/2022 1612   LDLCALC 94 11/28/2022 1612   LDLDIRECT 169.0 06/24/2018 1408    Physical Exam:   VS:  BP 122/60   Pulse 80   Ht '5\' 7"'$  (1.702 m)   Wt 171 lb (77.6 kg)   SpO2 98%   BMI 26.78 kg/m    Wt Readings from Last 3 Encounters:  12/06/22 171 lb (77.6 kg)  12/05/22 173 lb 3.2 oz (78.6 kg)  12/04/22 172 lb 8 oz (78.2 kg)    General: Well nourished, well developed, in no acute distress Head: Atraumatic, normal size  Eyes: PEERLA, EOMI  Neck: Supple, no JVD Endocrine: No thryomegaly Cardiac: Normal S1, S2; RRR; no murmurs, rubs, or gallops Lungs: Clear to auscultation bilaterally, no wheezing, rhonchi or rales  Abd: Soft, nontender, no hepatomegaly  Ext: No edema, pulses 2+ Musculoskeletal: No deformities, BUE and BLE strength normal and equal Skin: Warm and dry, no rashes   Neuro: Alert and oriented to person, place, time, and situation, CNII-XII grossly intact, no focal deficits  Psych: Normal mood and affect   ASSESSMENT:   Erik Lolling Sr. is a 77 y.o. male who presents for the following: 1. Preoperative cardiovascular examination   2. Coronary artery disease of native artery of native heart with stable angina pectoris ([place])   3. Hyperlipidemia LDL goal <70   4. SVT (supraventricular tachycardia)     PLAN:   1. Preoperative cardiovascular  examination -Plans for colonoscopy due to  anemia.  May proceed as deemed appropriate by GI.  He is not on aspirin or Plavix at this time.  2. Coronary artery disease of native artery of native heart with stable angina pectoris ([place]) 3. Hyperlipidemia LDL goal <70 -PCI to the proximal LAD in July of this past year.  Has had recurrent GI bleed.  Completed 4 to 5 months of aspirin and Plavix.  No longer tolerate aspirin.  His hemoglobin is downtrending.  He is short of breath.  He is having heart racing which I suspect is related to anemia.  He is not on any DAPT.  He may proceed with GI evaluation as deemed appropriate.  His blood pressure is controlled on no medications.  His echo is normal.  His CV exam is normal.  He cannot tolerate statins.  I recommended referral for Repatha therapy.  He is interested.  Hopefully he can tolerate this.  4. SVT (supraventricular tachycardia) -Brief, infrequent episodes.  No treatment needed.  Symptoms are likely related to anemia.      Disposition: No follow-ups on file.  Medication Adjustments/Labs and Tests Ordered: Current medicines are reviewed at length with the patient today.  Concerns regarding medicines are outlined above.  Orders Placed This Encounter  Procedures   AMB Referral to [facility]   No orders of the defined types were placed in this encounter.   Patient Instructions  Medication Instructions:  The current medical regimen is effective;  continue present plan and medications.  *If you need a refill on your cardiac medications before your next appointment, please call your pharmacy*   Follow-Up: At [facility], you and your health needs are our priority.  As part of our continuing mission to provide you with exceptional heart care, we have created designated Provider Care Teams.  These Care Teams include your primary Cardiologist (physician) and Advanced Practice Providers (APPs -  Physician Assistants and Nurse  Practitioners) who all work together to provide you with the care you need, when you need it.  We recommend signing up for the patient portal called "MyChart".  Sign up information is provided on this After Visit Summary.  MyChart is used to connect with patients for Virtual Visits (Telemedicine).  Patients are able to view lab/test results, encounter notes, upcoming appointments, etc.  Non-urgent messages can be sent to your provider as well.   To learn more about what you can do with MyChart, go to [email or web address].    Your next appointment:   6 month(s)  Provider:   Evalina Field, MD    Referral to pharmacy team- they will reach out to get you scheduled.      Time Spent with Patient: I have spent a total of 25 minutes with patient reviewing hospital notes, telemetry, EKGs, labs and examining the patient as well as establishing an assessment and plan that was discussed with the patient.  > 50% of time was spent in direct patient care.  Signed, Addison Naegeli. Audie Box, MD, [place]  [address], [place], [place] [address] [phone]  12/06/2022 1:52 PM

## 2022-12-04 NOTE — Patient Instructions (Addendum)
1. Chronic rhinitis with a chronic cough - Testing was only positive to a grass, otherwise negative.  - Copy of testing results provided. - I do not think that this will explain your symptoms. - Continue with the medications as you are doing to help prevent this sensation. - I hope the cardiologist will be able to provide some support.   2. Mouth lesion - Schedule patch testing on a Monday (these are read on Wednesdays and Fridays as well). - This will look for a metal allergy which should guide your dental work.   3. Follow up next available for patch testing placement.    Please inform us of any Emergency Department visits, hospitalizations, or changes in symptoms. Call us before going to the ED for breathing or allergy symptoms since we might be able to fit you in for a sick visit. Feel free to contact us anytime with any questions, problems, or concerns.  It was a pleasure to meet you and your wife today!  Websites that have reliable patient information: 1. American Academy of Asthma, Allergy, and Immunology: www.aaaai.org 2. Food Allergy Research and Education (FARE): foodallergy.org 3. Mothers of Asthmatics: http://www.asthmacommunitynetwork.org 4. American College of Allergy, Asthma, and Immunology: www.acaai.org   COVID-19 Vaccine Information can be found at: ShippingScam.co.uk For questions related to vaccine distribution or appointments, please email [email or web address] or call [phone].   We realize that you might be concerned about having an allergic reaction to the COVID19 vaccines. To help with that concern, WE ARE OFFERING THE COVID19 VACCINES IN OUR OFFICE! Ask the front desk for dates!     "Like" [name] on Facebook and Instagram for our latest updates!      A healthy democracy works best when [employer] participate! Make sure you are registered to vote! If you have moved or changed any of your contact  information, you will need to get this updated before voting!  In some cases, you MAY be able to register to vote online: [email or web address]      True Test looks for the following sensitivities:     We will also do a metal series as well.    Airborne Adult Perc - 12/04/22 1433     Time Antigen Placed 1434    Allergen Manufacturer Lavella Hammock    Location Back    Number of Test 59    Panel 1 Select    1. Control-Buffer 50% Glycerol Negative    2. Control-Histamine 1 mg/ml 2+    3. Albumin saline Negative    4. [place] Negative    5. [place] Negative    6. Johnson Negative    7. [place] Blue Negative    8. Meadow Fescue Negative    9. Perennial Rye Negative    10. Sweet Vernal Negative    11. Timothy 2+    12. Cocklebur Negative    13. Burweed Marshelder Negative    14. Ragweed, short Negative    15. Ragweed, Giant Negative    16. Plantain,  English Negative    17. Lamb's Quarters Negative    18. Sheep Sorrell Negative    19. Rough Pigweed Negative    20. Marsh Elder, Rough Negative    21. Mugwort, Common Negative    22. Ash mix Negative    23. Birch mix Negative    24. Beech American Negative    25. Box, Elder Negative    26. Cedar, red Negative    27. Cottonwood, [place] Negative    28.  Elm mix Negative    29. Hickory Negative    30. Maple mix Negative    31. Oak, [place] mix Negative    32. Pecan Pollen Negative    33. Pine mix Negative    34. Sycamore Eastern Negative    35. [place], Black Pollen Negative    36. Alternaria alternata Negative    37. Cladosporium Herbarum Negative    38. Aspergillus mix Negative    39. Penicillium mix Negative    40. Bipolaris sorokiniana (Helminthosporium) Negative    41. Drechslera spicifera (Curvularia) Negative    42. Mucor plumbeus Negative    43. Fusarium moniliforme Negative    44. Aureobasidium pullulans (pullulara) Negative    45. Rhizopus oryzae Negative    46. Botrytis cinera Negative     47. Epicoccum nigrum Negative    48. Phoma betae Negative    49. Candida Albicans Negative    50. Trichophyton mentagrophytes Negative    51. Mite, D Farinae  5,000 AU/ml Negative    52. Mite, D Pteronyssinus  5,000 AU/ml Negative    53. Cat Hair 10,000 BAU/ml Negative    54.  Dog Epithelia Negative    55. Mixed Feathers Negative    56. Horse Epithelia Negative    57. Cockroach, German Negative    58. Mouse Negative    59. Tobacco Leaf Negative             Intradermal - 12/04/22 1501     Time Antigen Placed 1400    Allergen Manufacturer Greer    Location Arm    Number of Test 14    Control Negative    Guatem[place] Negative    Johnson Negative    Ragweed mix Negative    Weed mix Negative    Tree mix Negative    Mold 1 Negative    Mold 2 Negative    Mold 3 Negative    Mold 4 Negative    Cat Negative    Dog Negative    Cockroach Negative    Mite mix Negative

## 2022-12-05 ENCOUNTER — Telehealth: Payer: Self-pay | Admitting: *Deleted

## 2022-12-05 ENCOUNTER — Other Ambulatory Visit: Payer: Self-pay | Admitting: *Deleted

## 2022-12-05 ENCOUNTER — Inpatient Hospital Stay: Payer: Medicare Other

## 2022-12-05 ENCOUNTER — Inpatient Hospital Stay: Payer: Medicare Other | Attending: Hematology and Oncology | Admitting: Hematology and Oncology

## 2022-12-05 ENCOUNTER — Other Ambulatory Visit: Payer: Self-pay

## 2022-12-05 VITALS — BP 133/67 | HR 87 | Temp 97.9°F | Resp 16 | Ht 67.0 in | Wt 173.2 lb

## 2022-12-05 DIAGNOSIS — K922 Gastrointestinal hemorrhage, unspecified: Secondary | ICD-10-CM | POA: Diagnosis not present

## 2022-12-05 DIAGNOSIS — D500 Iron deficiency anemia secondary to blood loss (chronic): Secondary | ICD-10-CM | POA: Diagnosis not present

## 2022-12-05 LAB — CBC WITH DIFFERENTIAL (CANCER CENTER ONLY)
Abs Immature Granulocytes: 0.03 10*3/uL (ref 0.00–0.07)
Basophils Absolute: 0.1 10*3/uL (ref 0.0–0.1)
Basophils Relative: 1 %
Eosinophils Absolute: 0.1 10*3/uL (ref 0.0–0.5)
Eosinophils Relative: 1 %
HCT: 26.6 % — ABNORMAL LOW (ref 39.0–52.0)
Hemoglobin: 7.9 g/dL — ABNORMAL LOW (ref 13.0–17.0)
Immature Granulocytes: 0 %
Lymphocytes Relative: 32 %
Lymphs Abs: 2.5 10*3/uL (ref 0.7–4.0)
MCH: 21.8 pg — ABNORMAL LOW (ref 26.0–34.0)
MCHC: 29.7 g/dL — ABNORMAL LOW (ref 30.0–36.0)
MCV: 73.3 fL — ABNORMAL LOW (ref 80.0–100.0)
Monocytes Absolute: 0.8 10*3/uL (ref 0.1–1.0)
Monocytes Relative: 10 %
Neutro Abs: 4.2 10*3/uL (ref 1.7–7.7)
Neutrophils Relative %: 56 %
Platelet Count: 435 10*3/uL — ABNORMAL HIGH (ref 150–400)
RBC: 3.63 MIL/uL — ABNORMAL LOW (ref 4.22–5.81)
RDW: 17.2 % — ABNORMAL HIGH (ref 11.5–15.5)
WBC Count: 7.6 10*3/uL (ref 4.0–10.5)
nRBC: 0 % (ref 0.0–0.2)

## 2022-12-05 LAB — IRON AND IRON BINDING CAPACITY (CC-WL,HP ONLY)
Iron: 15 ug/dL — ABNORMAL LOW (ref 45–182)
Saturation Ratios: 3 % — ABNORMAL LOW (ref 17.9–39.5)
TIBC: 451 ug/dL — ABNORMAL HIGH (ref 250–450)
UIBC: 436 ug/dL

## 2022-12-05 LAB — SAMPLE TO BLOOD BANK

## 2022-12-05 LAB — ABO/RH: ABO/RH(D): O POS

## 2022-12-05 LAB — PREPARE RBC (CROSSMATCH)

## 2022-12-05 NOTE — Telephone Encounter (Signed)
Pt notified in clinic that he will receive 1 unit of prbc on Friday 1/26 at 12pm. Pt verbalized understanding

## 2022-12-05 NOTE — Progress Notes (Unsigned)
[facility] NOTE  Patient Care Team: [name], MD as PCP - General (Internal Medicine) O'Neal, Cassie Freer, MD as PCP - Cardiology (Cardiology) [name], MD as Attending Physician (Internal Medicine) Domingo Pulse, MD (Urology) Phylliss Blakes, OD as Consulting Physician (Optometry)  CHIEF COMPLAINTS/PURPOSE OF CONSULTATION:  Iron deficiency anemia  ASSESSMENT & PLAN:   Iron deficiency anemia due to chronic blood loss This is a very pleasant 77 year old male patient with past medical history significant for GI bleed referred to hematology for further recommendations regarding his persistent iron deficiency and severe anemia. He was lost to follow up after his last visit here in 2022. He is now very symptomatic, has severe fatigue, positional dizziness, palpitations and was hoping to have iron again. He can't tolerate PO iron. We have once again discussed about IV iron, adverse effects from IV iron including but not limited to anaphylaxis, flulike symptoms, headaches and rarely electrolyte imbalances.  I ordered Venofer.  He is also very symptomatic when he stands up from sitting position hence we will try to get him 2 units of blood tomorrow or day after.  He understands that he can always go to the emergency room if he becomes very symptomatic.  He is also scheduled to follow-up with the gastroenterology for endoscopy and biopsies given the black stools.  He was recommended to refrain from any NSAID usage in the interim.  Thank you for consulting on the care of this patient.  Please not hesitate to contact us with any additional questions or concerns.   No orders of the defined types were placed in this encounter. Total time spent: 30 minutes including history and physical, review of records, counseling and coordination of care  HISTORY OF PRESENTING ILLNESS:  Erik Lolling Sr. 77 y.o. male is here because of Iron deficiency  This is a very pleasant  77 year old male patient with multiple medical comorbidities, GI bleed last year who presents to hematology for further recommendations regarding his iron deficiency. He was last seen in August 2022 when he was reluctant to consider IV iron and wanted to try iron supplement. He was then lost to follow up. Since he last was here, had a major GI bleed in Oct of 2023 while he was on Aspirin and Plavix for CAD and stent. He has some pinkish stool from time to time, and some black stools from time to time. He has endoscopy and biopsy with Dr Carlean Purl next week. He tried some bodybuilder supplement for the past 3 days. He has been very tired, racing heart and passes out when he bends down.  Rest of the pertinent 10 point ROS reviewed and neg.  MEDICAL HISTORY:  Past Medical History:  Diagnosis Date   Allergic rhinitis    Allergy    Anal fissure    Anemia    Anxiety    Blood transfusion without reported diagnosis    Cataract    starting   Coronary atherosclerosis of native coronary artery 2007   nonobstructive CAD by cath with 40% mid-LAD stenosis   Degenerative arthritis of knee, bilateral 10/09/2017   Depression    Diverticulosis 12/18/2011   Diverticulosis of colon with hemorrhage    April 2021, [facility] healthcare   Duodenal mass    Fibromyalgia    GERD with stricture    Helicobacter pylori (H. 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   Vitamin D deficiency 11/29/2017     SURGICAL HISTORY: Past Surgical History:  Procedure Laterality Date   ADENOIDECTOMY     bladder distention     x 6   CARDIAC CATHETERIZATION     CATHETER REMOVAL     super pubic area   COLONOSCOPY     CORONARY STENT INTERVENTION N/A 05/29/2022   Procedure: CORONARY STENT INTERVENTION;  Surgeon: Martinique, Peter M, MD;  Location: [facility];  Service: Cardiovascular;  Laterality: N/A;   DENTAL SURGERY Right    #30 tooth extraction (right upper)   INTERSTIM IMPLANT PLACEMENT     INTERSTIM IMPLANT REMOVAL     KNEE ARTHROSCOPY     right x 2   LEFT HEART CATH AND CORONARY ANGIOGRAPHY N/A 05/29/2022   Procedure: LEFT HEART CATH AND CORONARY ANGIOGRAPHY;  Surgeon: Martinique, Peter M, MD;  Location: [facility];  Service: Cardiovascular;  Laterality: N/A;   NISSEN FUNDOPLICATION  2725   PROSTECTOMY     ROBOT ASSISTED LAPAROSCOPIC COMPLETE CYSTECT ILEAL CONDUIT     SHOULDER ARTHROSCOPY  2010   left   TONSILLECTOMY AND ADENOIDECTOMY  1957   TOTAL KNEE ARTHROPLASTY Right 09/08/2021   Procedure: TOTAL KNEE ARTHROPLASTY;  Surgeon: Susa Day, MD;  Location: [facility];  Service: Orthopedics;  Laterality: Right;   TRANSURETHRAL RESECTION OF PROSTATE     x 2   UPPER GASTROINTESTINAL ENDOSCOPY  05/13/2008   hiatal hernia   VASECTOMY      SOCIAL HISTORY: Social History   Socioeconomic History   Marital status: Married    Spouse name: Not on file   Number of children: 4   Years of education: Not on file   Highest education level: Not on file  Occupational History   Occupation: retired  Tobacco Use   Smoking status: Former    Passive exposure: Past   Smokeless tobacco: Never   Tobacco comments:    quit 1980  Vaping Use   Vaping Use: Never used  Substance and Sexual Activity   Alcohol use: No    Alcohol/week: 0.0 standard drinks of alcohol   Drug use: No   Sexual activity: Yes    Partners: Female  Other Topics Concern   Not on file  Social History Narrative    Married, retired for children   He is a former smoker no alcohol or substance use   Social Determinants of Radio broadcast assistant Strain: Low Risk  (12/08/2021)   Overall Financial Resource Strain (CARDIA)    Difficulty of Paying Living Expenses: Not hard at all  Food Insecurity: No Food Insecurity (12/08/2021)   Hunger Vital Sign    Worried About Running Out of Food in the Last Year: Never true    [place] in the Last Year: Never true  Transportation Needs: No Transportation Needs (12/08/2021)   PRAPARE - Hydrologist (Medical): No    Lack of Transportation (Non-Medical):  No  Physical Activity: Insufficiently Active (08/28/2022)   Exercise Vital Sign    Days of Exercise per Week: 2 days    Minutes of Exercise per Session: 10 min  Stress: Stress Concern Present (09/11/2022)   [place]    Feeling of Stress : Rather much  Social Connections: Moderately Integrated (12/08/2021)   Social Connection and Isolation Panel [NHANES]    Frequency of Communication with Friends and Family: Three times a week    Frequency of Social Gatherings with Friends and Family: Three times a week    Attends Religious Services: More than 4 times per year    Active Member of Clubs or Organizations: No    Attends Archivist Meetings: Never    Marital Status: Married  Human resources officer Violence: Not At Risk (12/08/2021)   Humiliation, Afraid, Rape, and Kick questionnaire    Fear of Current or Ex-Partner: No    Emotionally Abused: No    Physically Abused: No    Sexually Abused: No    FAMILY HISTORY: Family History  Problem Relation Age of Onset   Allergic rhinitis Mother    Heart disease Mother    Hypertension Mother    Stroke Mother    Arthritis Mother    Colon polyps Father    Diabetes Father    Heart disease Father    Hypertension Father    Diabetes Sister    Breast cancer Sister     Diabetes Paternal Uncle    Liver cancer Paternal Uncle    Liver cancer Maternal Grandfather    Colon cancer Neg Hx    Esophageal cancer Neg Hx    Rectal cancer Neg Hx    Stomach cancer Neg Hx    Pancreatic cancer Neg Hx     ALLERGIES:  is allergic to [place] [zolpidem], bentyl [dicyclomine], carafate [sucralfate], lansoprazole, lexapro [escitalopram oxalate], other, pneumovax [pneumococcal polysaccharide vaccine], prilosec [omeprazole], remeron [mirtazapine], seroquel [quetiapine], statins, tizanidine, tramadol, and tylenol [acetaminophen].  MEDICATIONS:  Current Outpatient Medications  Medication Sig Dispense Refill   amitriptyline (ELAVIL) 50 MG tablet      aspirin EC 81 MG tablet Take 1 tablet (81 mg total) by mouth daily. Swallow whole. 90 tablet 3   benzonatate (TESSALON PERLES) 100 MG capsule Take 1 capsule (100 mg total) by mouth 3 (three) times daily as needed for cough. 20 capsule 0   Camphor-Eucalyptus-Menthol (VICKS VAPORUB EX) Apply 1 Application topically daily as needed (congestion).     diclofenac Sodium (VOLTAREN) 1 % GEL Apply 1 Application topically 4 (four) times daily as needed (pain).     diphenoxylate-atropine (LOMOTIL) 2.5-0.025 MG tablet Take 1 tablet by mouth 4 (four) times daily as needed for diarrhea or loose stools.     LORazepam (ATIVAN) 0.5 MG tablet Take 0.5 tablets (0.25 mg total) by mouth 2 (two) times daily as needed for anxiety. 30 tablet 2   nitroGLYCERIN (NITROSTAT) 0.4 MG SL tablet Place 1 tablet (0.4 mg total) under the tongue every 5 (five) minutes as needed for chest pain. 25 tablet 3   Omega-3 Fatty Acids (OMEGA-3 FISH OIL PO) Take 1 tablet by mouth daily.     traMADol (ULTRAM) 50 MG tablet TAKE 1 TABLET BY MOUTH TWICE A DAY AS NEEDED 120 tablet 2   No current facility-administered medications for this visit.     PHYSICAL EXAMINATION: ECOG PERFORMANCE STATUS: 1 - Symptomatic but completely ambulatory  Vitals:   12/05/22 1539  BP:  133/67   Pulse: 87  Resp: 16  Temp: 97.9 F (36.6 C)  SpO2: 100%   Filed Weights   12/05/22 1539  Weight: 173 lb 3.2 oz (78.6 kg)    GENERAL:alert, no distress and comfortable Neck: palpable lymphadenopathy Chest: CTA bilaterally Heart: RRR PSYCH: alert & oriented x 3 with fluent speech NEURO: no focal motor/sensory deficits  LABORATORY DATA:  I have reviewed the data as listed Lab Results  Component Value Date   WBC 7.6 12/05/2022   HGB 7.9 (L) 12/05/2022   HCT 26.6 (L) 12/05/2022   MCV 73.3 (L) 12/05/2022   PLT 435 (H) 12/05/2022     Chemistry      Component Value Date/Time   NA 138 11/28/2022 1612   NA 139 05/18/2022 1023   K 4.8 11/28/2022 1612   CL 104 11/28/2022 1612   CO2 26 11/28/2022 1612   BUN 20 11/28/2022 1612   BUN 22 05/18/2022 1023   CREATININE 1.37 11/28/2022 1612      Component Value Date/Time   CALCIUM 9.1 11/28/2022 1612   ALKPHOS 64 11/28/2022 1612   AST 13 11/28/2022 1612   ALT 11 11/28/2022 1612   BILITOT 0.3 11/28/2022 1612     I reviewed his labs.  His most recent labs showed no evidence of anemia, hemoglobin of 14.8 g/dL, ferritin of 21 and his rest of the CBC appears quite unremarkable.  RADIOGRAPHIC STUDIES: I have personally reviewed the radiological images as listed and agreed with the findings in the report. LONG TERM MONITOR (3-14 DAYS)  Result Date: 12/04/2022 Patch Wear Time:  7 days and 23 hours (2024-01-08T15:37:28-0500 to 2024-01-16T14:42:25-0500) Patient had a min HR of 50 bpm (sinus bradycardia), max HR of 152 bpm (supraventricular tachycardia), and avg HR of 74 bpm (normal sinus rhythm). Predominant underlying rhythm was Sinus Rhythm. 3 Supraventricular Tachycardia runs occurred, the run with the fastest interval lasting 13 beats (6.3 second duration) with a max rate of 152 bpm (avg 126 bpm); the run with the fastest interval was also the longest. Supraventricular Tachycardia was detected within +/- 45 seconds of symptomatic  patient event(s). Isolated SVEs were rare (<1.0%), SVE Couplets were rare (<1.0%), and SVE Triplets were rare (<1.0%). Isolated VEs were rare (<1.0%), and no VE Couplets or VE Triplets were present. Impression: Brief supraventricular tachycardia was detected (3 episodes in 7 days; longest duration 6.3 seconds). Rare ectopy. Lake Bells T. Audie Box, MD, [place] [address] [place], [place] [address] [phone] 2:36 PM  DG Chest 2 View  Result Date: 11/30/2022 CLINICAL DATA:  Persistent cough. EXAM: CHEST - 2 VIEW COMPARISON:  04/16/2022. FINDINGS: Heart is enlarged and the mediastinal contour is within normal limits. Lung volumes are low. No consolidation, effusion, or pneumothorax. Degenerative changes are present in the thoracic spine. IMPRESSION: Cardiomegaly with no acute abnormality. Electronically Signed   By: Brett Fairy M.D.   On: 11/30/2022 02:41   MR Brain Wo Contrast  Result Date: 11/16/2022 CLINICAL DATA:  Hearing loss, difficulty walking and numbness. EXAM: MRI HEAD WITHOUT CONTRAST TECHNIQUE: Multiplanar, multiecho pulse sequences of the brain and surrounding structures were obtained without intravenous contrast. COMPARISON:  CT head 11/15/2020 FINDINGS: Brain: There is no acute intracranial hemorrhage, extra-axial fluid collection, or acute infarct. Background parenchymal volume is normal. The ventricles are normal in size. Gray-white differentiation is preserved. Parenchymal signal is essentially normal, with no significant burden of underlying chronic small-vessel ischemic change The pituitary and suprasellar region are  normal. There is no mass lesion. There is no mass effect or midline shift. Vascular: Normal flow voids. Skull and upper cervical spine: Normal marrow signal. Sinuses/Orbits: There is a retention cyst in the left maxillary sinus. The paranasal sinuses are otherwise clear. The globes and orbits are unremarkable. Other: The mastoid air cells  are clear. IMPRESSION: 1. Unremarkable appearance of the brain with no acute intracranial pathology. 2. Left maxillary sinus mucous retention cyst. Electronically Signed   By: Valetta Mole M.D.   On: 11/16/2022 16:57    All questions were answered. The patient knows to call the clinic with any problems, questions or concerns. I spent 30 minutes in the care of this patient including H and P, review of records, counseling and coordination of care.     Benay Pike, MD 12/05/2022 3:44 PM

## 2022-12-06 ENCOUNTER — Encounter: Payer: Self-pay | Admitting: Allergy & Immunology

## 2022-12-06 ENCOUNTER — Encounter: Payer: Self-pay | Admitting: Cardiovascular Disease

## 2022-12-06 ENCOUNTER — Ambulatory Visit: Payer: Medicare Other | Attending: Cardiovascular Disease | Admitting: Cardiovascular Disease

## 2022-12-06 ENCOUNTER — Encounter: Payer: Self-pay | Admitting: Hematology and Oncology

## 2022-12-06 VITALS — BP 122/60 | HR 80 | Ht 67.0 in | Wt 171.0 lb

## 2022-12-06 DIAGNOSIS — I25118 Atherosclerotic heart disease of native coronary artery with other forms of angina pectoris: Secondary | ICD-10-CM | POA: Diagnosis not present

## 2022-12-06 DIAGNOSIS — I471 Supraventricular tachycardia, unspecified: Secondary | ICD-10-CM | POA: Diagnosis not present

## 2022-12-06 DIAGNOSIS — Z5189 Encounter for other specified aftercare: Secondary | ICD-10-CM

## 2022-12-06 DIAGNOSIS — E785 Hyperlipidemia, unspecified: Secondary | ICD-10-CM | POA: Diagnosis not present

## 2022-12-06 DIAGNOSIS — Z01810 Encounter for preprocedural cardiovascular examination: Secondary | ICD-10-CM | POA: Diagnosis not present

## 2022-12-06 HISTORY — DX: Encounter for other specified aftercare: Z51.89

## 2022-12-06 LAB — FERRITIN: Ferritin: 4 ng/mL — ABNORMAL LOW (ref 24–336)

## 2022-12-06 NOTE — Telephone Encounter (Signed)
Please see today's office visit with cardiology for clearance.

## 2022-12-06 NOTE — Telephone Encounter (Signed)
Seen by patient Milda Smart Sr. on 12/04/2022  5:53 PM; will send follow up mychart message to patient

## 2022-12-06 NOTE — Patient Instructions (Signed)
Medication Instructions:  The current medical regimen is effective;  continue present plan and medications.  *If you need a refill on your cardiac medications before your next appointment, please call your pharmacy*   Follow-Up: At [facility], you and your health needs are our priority.  As part of our continuing mission to provide you with exceptional heart care, we have created designated Provider Care Teams.  These Care Teams include your primary Cardiologist (physician) and Advanced Practice Providers (APPs -  Physician Assistants and Nurse Practitioners) who all work together to provide you with the care you need, when you need it.  We recommend signing up for the patient portal called "MyChart".  Sign up information is provided on this After Visit Summary.  MyChart is used to connect with patients for Virtual Visits (Telemedicine).  Patients are able to view lab/test results, encounter notes, upcoming appointments, etc.  Non-urgent messages can be sent to your provider as well.   To learn more about what you can do with MyChart, go to [email or web address].    Your next appointment:   6 month(s)  Provider:   Evalina Field, MD    Referral to pharmacy team- they will reach out to get you scheduled.

## 2022-12-07 ENCOUNTER — Other Ambulatory Visit: Payer: Self-pay

## 2022-12-07 ENCOUNTER — Inpatient Hospital Stay: Payer: Medicare Other

## 2022-12-07 DIAGNOSIS — D500 Iron deficiency anemia secondary to blood loss (chronic): Secondary | ICD-10-CM

## 2022-12-07 DIAGNOSIS — K922 Gastrointestinal hemorrhage, unspecified: Secondary | ICD-10-CM | POA: Diagnosis not present

## 2022-12-07 MED ORDER — SODIUM CHLORIDE 0.9% IV SOLUTION
250.0000 mL | Freq: Once | INTRAVENOUS | Status: AC
  Administered 2022-12-07: 250 mL via INTRAVENOUS

## 2022-12-07 NOTE — Telephone Encounter (Signed)
Left a message to return call.  

## 2022-12-07 NOTE — Telephone Encounter (Signed)
-----  Message -----  From: Geralynn Rile, MD  Sent: 12/06/2022   1:44 PM EST  To: Vladimir Crofts, PA-C  Subject: [facility] Patient                                 Estill Bamberg:   Mr. Kilgore can proceed with colonoscopy and EGD as you deem appropriate.  He is not on aspirin or Plavix.  His symptoms are likely related to anemia.  Hopefully we can find a source of bleeding this time.   -Wes   Lake Bells T. Audie Box, MD, [place]  [address], [place]  [place], [place] [address]  [phone]  1:44 PM

## 2022-12-07 NOTE — Patient Instructions (Signed)

## 2022-12-07 NOTE — Telephone Encounter (Signed)
Left message to return call 

## 2022-12-07 NOTE — Telephone Encounter (Signed)
Patient is returning your call.  

## 2022-12-08 LAB — BPAM RBC
Blood Product Expiration Date: 202402262359
Blood Product Expiration Date: 202402262359
ISSUE DATE / TIME: 202401261225
ISSUE DATE / TIME: 202401261225
Unit Type and Rh: 5100
Unit Type and Rh: 5100

## 2022-12-08 LAB — TYPE AND SCREEN
ABO/RH(D): O POS
Antibody Screen: NEGATIVE
Unit division: 0
Unit division: 0

## 2022-12-10 ENCOUNTER — Telehealth: Payer: Self-pay | Admitting: Hematology and Oncology

## 2022-12-10 ENCOUNTER — Encounter: Payer: Self-pay | Admitting: Certified Registered Nurse Anesthetist

## 2022-12-10 NOTE — Telephone Encounter (Signed)
Patient returned your call.

## 2022-12-10 NOTE — Telephone Encounter (Signed)
Patient is returning your call.  

## 2022-12-10 NOTE — Telephone Encounter (Signed)
Left message with Mrs. Ambrocio for a return call.

## 2022-12-10 NOTE — Telephone Encounter (Signed)
Patient returning call. Please advise

## 2022-12-10 NOTE — Telephone Encounter (Signed)
Called patient per 1/29 secure chat to inform of scheduled appointments. Patient notified.

## 2022-12-10 NOTE — Telephone Encounter (Signed)
[name] is aware of cardiac clearance. Today [name] wanted to update [name] on his symptoms. He has had RUQ abdominal pain, above the stoma site, closer to the rib cage area, this has now been consistent for 2 weeks. He describes the pain as a burning pain that is worse when sitting. He hasn't tried anything for relief as he tells me he is sensative to medications. He is scheduled for an EGD on 12-17-2022 with Dr Carlean Purl, I Instructed him to proceed with the scheduled EGD and If pain becomes worse to please go to the ER. He asked me to have a provider review in case he needs any additional testing before his procedure or if they need to change the procedure to an EUS. Please advise

## 2022-12-10 NOTE — Telephone Encounter (Signed)
Left message to return call 

## 2022-12-11 DIAGNOSIS — Z932 Ileostomy status: Secondary | ICD-10-CM | POA: Diagnosis not present

## 2022-12-11 NOTE — Telephone Encounter (Signed)
Patient declines the idea of the lidocaine and the [name]. He states he doesn't want to cover up the pain, he wants an explanation. He also declined an [name] as he has had lots of imagining, recently a CT in October that showed no results to the current situation and doesn't feel that the [name] also wont show anything. He will proceed with EGD at this time as scheduled.

## 2022-12-12 DIAGNOSIS — Z932 Ileostomy status: Secondary | ICD-10-CM | POA: Diagnosis not present

## 2022-12-13 ENCOUNTER — Encounter: Payer: Self-pay | Admitting: Internal Medicine

## 2022-12-14 ENCOUNTER — Encounter: Payer: Self-pay | Admitting: Pharmacist

## 2022-12-14 ENCOUNTER — Ambulatory Visit: Payer: Medicare Other | Attending: Cardiovascular Disease | Admitting: Pharmacist

## 2022-12-14 DIAGNOSIS — Z789 Other specified health status: Secondary | ICD-10-CM

## 2022-12-14 DIAGNOSIS — I25118 Atherosclerotic heart disease of native coronary artery with other forms of angina pectoris: Secondary | ICD-10-CM

## 2022-12-14 DIAGNOSIS — R931 Abnormal findings on diagnostic imaging of heart and coronary circulation: Secondary | ICD-10-CM | POA: Insufficient documentation

## 2022-12-14 DIAGNOSIS — E7800 Pure hypercholesterolemia, unspecified: Secondary | ICD-10-CM

## 2022-12-14 MED FILL — Iron Sucrose Inj 20 MG/ML (Fe Equiv): INTRAVENOUS | Qty: 10 | Status: AC

## 2022-12-14 NOTE — Patient Instructions (Signed)
It was nice meeting you two today   We would like your LDL (bad cholesterol) to be less than 70  We would like to start a new medication called Repatha which you would inject once every 2 weeks  I will complete the prior authorization for you and contact you when it is approved  Once you start the medication, we will recheck your cholesterol in about 2-3 months  Please call or message with any questions  Karren Cobble, PharmD, [place], [place], [place], [place], [place], [address] Phone: [phone], Fax: [phone]

## 2022-12-14 NOTE — Progress Notes (Signed)
Patient ID: Erik COLLERAN Sr.                 DOB: 1946-03-08                    MRN: [id number]     HPI: Erik SANSOM Sr. is a 77 y.o. male patient referred to lipid clinic by Dr Audie Box. [facility] is significant for CAD, HLD, and statin intolerance. Currently being treated for active GI bleed.  Patient presents today with wife. His priority is to not start any treatment that may interfere or or exacerbate his anemia. Has multiple medication intolerances including statins, antidepressants, and pain relievers. Reports had adverse effects to pneumonia vaccine for 5 years.  History of CAD with PCI 05/28/22. Drug eluting stent placed in LAD which was 90% stenosed.   Current Medications: N/A  Intolerances:  Statins Zetia  Risk Factors:  CAD Coronary Calcium  LDL goal: <70  Labs: TC 170, Trigs 167, HDL 42, LDL 94 (11/28/22)  Coronary calcium score of 675. This was 70th percentile for age-, sex, and race-matched controls. Plaque volume 549 mm3.  Past Medical History:  Diagnosis Date   Allergic rhinitis    Allergy    Anal fissure    Anemia    Anxiety    Blood transfusion without reported diagnosis    Cataract    starting   Coronary atherosclerosis of native coronary artery 2007   nonobstructive CAD by cath with 40% mid-LAD stenosis   Degenerative arthritis of knee, bilateral 10/09/2017   Depression    Diverticulosis 12/18/2011   Diverticulosis of colon with hemorrhage    April 2021, [facility] healthcare   Duodenal mass    Fibromyalgia    GERD with stricture    Helicobacter pylori (H. 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     benzonatate (TESSALON PERLES) 100 MG capsule Take 1 capsule (100 mg total) by mouth 3 (three) times daily as needed for cough. 20 capsule 0   Camphor-Eucalyptus-Menthol (VICKS VAPORUB EX) Apply 1 Application topically daily as needed (congestion).     diclofenac Sodium (VOLTAREN) 1 % GEL Apply 1 Application topically 4 (four) times daily as needed (pain).     diphenoxylate-atropine (LOMOTIL) 2.5-0.025 MG tablet Take 1 tablet by mouth 4 (four) times daily as needed for diarrhea or loose stools.     LORazepam (ATIVAN) 0.5 MG tablet Take 0.5 tablets (0.25 mg total) by mouth 2 (two) times daily as needed for anxiety. (Patient taking differently: Take 0.25 mg by mouth daily at 6 (six) AM.) 30 tablet 2   nitroGLYCERIN (NITROSTAT) 0.4 MG SL tablet Place 1 tablet (0.4 mg total) under the tongue every 5 (five)  minutes as needed for chest pain. 25 tablet 3   traMADol (ULTRAM) 50 MG tablet TAKE 1 TABLET BY MOUTH TWICE A DAY AS NEEDED 120 tablet 2   No current facility-administered medications on file prior to visit.    Allergies  Allergen Reactions   Ambien [Zolpidem]     Unknown reaction   Bentyl [Dicyclomine]     Memory loss, anxiety, blurred vision   Carafate [Sucralfate] Nausea And Vomiting   Lansoprazole Diarrhea   Lexapro [Escitalopram Oxalate]     Unknown reaction    Other     Pt states he is sensitive to all oral medications   Pneumovax [Pneumococcal Polysaccharide Vaccine] Other (See Comments)    pain   Prilosec [Omeprazole] Other (See Comments)    Per patient joint pain with PPI's   Remeron [Mirtazapine]     Unknown reaction    Seroquel  [Quetiapine] Other (See Comments)    Unknown reaction    Statins Other (See Comments)    Joint pain   Tizanidine Other (See Comments)    Made patient "feel crazy"   Tramadol Other (See Comments)    Pt can take small amounts up to twice daily but continued use causes chest / abdominal pain   Tylenol [Acetaminophen] Nausea And Vomiting    Can only take for 2 takes then he starts to have pain    Assessment/Plan:  1. Hyperlipidemia - Patient LDL 94 which is above goal of <70. Can not tolerate oral lipid lowering therapy. Due to CAD and elevated coronary calcium score, recommended starting PCSK9i.  Using [place] [employer] [place] [employer], educated patient on mechanism of action, storage, site selection, administration, and possible adverse effects. Advised medication should not interfere with his current anemia treatment. Will complete PA and contact patient when approved. Patient wishes to start once GI bleed is corrected. Recheck lipid panel 2-3 months later.  Start Repatha '140mg'$  sq q 14 days Recheck lipid panel in 2-3 months  Karren Cobble, PharmD, [place], [place], [place], [place], [place], [address] Phone: [phone], Fax: [phone]

## 2022-12-15 ENCOUNTER — Inpatient Hospital Stay: Payer: Medicare Other | Attending: Hematology and Oncology

## 2022-12-15 VITALS — BP 130/67 | HR 66 | Temp 97.9°F | Resp 18

## 2022-12-15 DIAGNOSIS — K922 Gastrointestinal hemorrhage, unspecified: Secondary | ICD-10-CM | POA: Diagnosis not present

## 2022-12-15 DIAGNOSIS — Z808 Family history of malignant neoplasm of other organs or systems: Secondary | ICD-10-CM | POA: Diagnosis not present

## 2022-12-15 DIAGNOSIS — Z803 Family history of malignant neoplasm of breast: Secondary | ICD-10-CM | POA: Diagnosis not present

## 2022-12-15 DIAGNOSIS — D500 Iron deficiency anemia secondary to blood loss (chronic): Secondary | ICD-10-CM | POA: Diagnosis not present

## 2022-12-15 DIAGNOSIS — Z87891 Personal history of nicotine dependence: Secondary | ICD-10-CM | POA: Insufficient documentation

## 2022-12-15 MED ORDER — HEPARIN SOD (PORK) LOCK FLUSH 100 UNIT/ML IV SOLN: 500.0000 [IU] | Freq: Once | INTRAVENOUS | Status: DC | PRN

## 2022-12-15 MED ORDER — HEPARIN SOD (PORK) LOCK FLUSH 100 UNIT/ML IV SOLN: 250.0000 [IU] | Freq: Once | INTRAVENOUS | Status: DC | PRN

## 2022-12-15 MED ORDER — SODIUM CHLORIDE 0.9% FLUSH: 10.0000 mL | Freq: Once | INTRAVENOUS | Status: DC | PRN

## 2022-12-15 MED ORDER — SODIUM CHLORIDE 0.9 % IV SOLN: Freq: Once | INTRAVENOUS | Status: AC

## 2022-12-15 MED ORDER — SODIUM CHLORIDE 0.9% FLUSH: 3.0000 mL | Freq: Once | INTRAVENOUS | Status: DC | PRN

## 2022-12-15 MED ORDER — ALTEPLASE 2 MG IJ SOLR: 2.0000 mg | Freq: Once | INTRAMUSCULAR | Status: DC | PRN

## 2022-12-15 MED ORDER — SODIUM CHLORIDE 0.9 % IV SOLN
200.0000 mg | Freq: Once | INTRAVENOUS | Status: AC
  Administered 2022-12-15: 200 mg via INTRAVENOUS
  Filled 2022-12-15: qty 200

## 2022-12-15 NOTE — Patient Instructions (Signed)

## 2022-12-17 ENCOUNTER — Inpatient Hospital Stay: Payer: Medicare Other

## 2022-12-17 ENCOUNTER — Other Ambulatory Visit (HOSPITAL_COMMUNITY): Payer: Self-pay

## 2022-12-17 ENCOUNTER — Ambulatory Visit: Payer: Medicare Other | Admitting: Family Medicine

## 2022-12-17 ENCOUNTER — Encounter: Payer: Self-pay | Admitting: Internal Medicine

## 2022-12-17 ENCOUNTER — Ambulatory Visit (AMBULATORY_SURGERY_CENTER): Payer: Medicare Other | Admitting: Internal Medicine

## 2022-12-17 ENCOUNTER — Telehealth: Payer: Self-pay

## 2022-12-17 VITALS — BP 120/67 | HR 67 | Temp 97.8°F | Resp 13 | Ht 67.0 in | Wt 173.0 lb

## 2022-12-17 DIAGNOSIS — K2970 Gastritis, unspecified, without bleeding: Secondary | ICD-10-CM

## 2022-12-17 DIAGNOSIS — R1013 Epigastric pain: Secondary | ICD-10-CM | POA: Diagnosis not present

## 2022-12-17 DIAGNOSIS — K2950 Unspecified chronic gastritis without bleeding: Secondary | ICD-10-CM | POA: Diagnosis not present

## 2022-12-17 DIAGNOSIS — D649 Anemia, unspecified: Secondary | ICD-10-CM

## 2022-12-17 DIAGNOSIS — Z8719 Personal history of other diseases of the digestive system: Secondary | ICD-10-CM

## 2022-12-17 HISTORY — PX: ESOPHAGOGASTRODUODENOSCOPY: SHX1529

## 2022-12-17 MED ORDER — SODIUM CHLORIDE 0.9 % IV SOLN: 500.0000 mL | Freq: Once | INTRAVENOUS | Status: DC

## 2022-12-17 NOTE — Patient Instructions (Addendum)
There was known mild stomach inflammation and evidence of your prior reflux surgery.  I took small intestine biopsies as planned (it looks normal).  I will be in touch with results.  The intravenous iron should help a lot.  Hopefully staying of Plavix will help also.  I appreciate the opportunity to care for you. Gatha Mayer, MD, [facility]  Recommendation:           - Patient has a contact number available for                            emergencies. The signs and symptoms of potential                            delayed complications were discussed with the                            patient. Return to normal activities tomorrow.                            Written discharge instructions were provided to the                            patient.                           - Resume previous diet.  Handout on gastritis given.  YOU HAD AN ENDOSCOPIC PROCEDURE TODAY AT [facility]:   Refer to the procedure report that was given to you for any specific questions about what was found during the examination.  If the procedure report does not answer your questions, please call your gastroenterologist to clarify.  If you requested that your care partner not be given the details of your procedure findings, then the procedure report has been included in a sealed envelope for you to review at your convenience later.  YOU SHOULD EXPECT: Some feelings of bloating in the abdomen. Passage of more gas than usual.  Walking can help get rid of the air that was put into your GI tract during the procedure and reduce the bloating. If you had a lower endoscopy (such as a colonoscopy or flexible sigmoidoscopy) you may notice spotting of blood in your stool or on the toilet paper. If you underwent a bowel prep for your procedure, you may not have a normal bowel movement for a few days.  Please Note:  You might notice some irritation and congestion in your nose or some drainage.  This is from the oxygen  used during your procedure.  There is no need for concern and it should clear up in a day or so.  SYMPTOMS TO REPORT IMMEDIATELY:  Following upper endoscopy (EGD)  Vomiting of blood or coffee ground material  New chest pain or pain under the shoulder blades  Painful or persistently difficult swallowing  New shortness of breath  Fever of 100F or higher  Black, tarry-looking stools  For urgent or emergent issues, a gastroenterologist can be reached at any hour by calling [phone]. Do not use MyChart messaging for urgent concerns.   DIET:  We do recommend a small meal at first, but then you may proceed to your regular diet.  Drink plenty of fluids  but you should avoid alcoholic beverages for 24 hours.  ACTIVITY:  You should plan to take it easy for the rest of today and you should NOT DRIVE or use heavy machinery until tomorrow (because of the sedation medicines used during the test).    FOLLOW UP: Our staff will call the number listed on your records the next business day following your procedure.  We will call around 7:15- 8:00 am to check on you and address any questions or concerns that you may have regarding the information given to you following your procedure. If we do not reach you, we will leave a message.     If any biopsies were taken you will be contacted by phone or by letter within the next 1-3 weeks.  Please call us at [phone] if you have not heard about the biopsies in 3 weeks.   SIGNATURES/CONFIDENTIALITY: You and/or your care partner have signed paperwork which will be entered into your electronic medical record.  These signatures attest to the fact that that the information above on your After Visit Summary has been reviewed and is understood.  Full responsibility of the confidentiality of this discharge information lies with you and/or your care-partner.

## 2022-12-17 NOTE — Progress Notes (Signed)
History and Physical Interval Note:  12/17/2022 10:05 AM  Erik Lolling Sr.  has presented today for endoscopic procedure(s), with the diagnosis of  Encounter Diagnoses  Name Primary?   History of GI bleed Yes   Symptomatic anemia    Abdominal pain, epigastric   .  The various methods of evaluation and treatment have been discussed with the patient and/or family. After consideration of risks, benefits and other options for treatment, the patient has consented to  the endoscopic procedure(s).   The patient's history has been reviewed, patient examined, no change in status, stable for endoscopic procedure(s).  I have reviewed the patient's chart and labs.  Questions were answered to the patient's satisfaction.     Gatha Mayer, MD, [facility]

## 2022-12-17 NOTE — Telephone Encounter (Signed)
-----   Message from Rollen Sox, [facility] sent at 12/14/2022  5:07 PM EST ----- Regarding: PA Please complete PA for Repatha

## 2022-12-17 NOTE — Progress Notes (Signed)
Called to room to assist during endoscopic procedure.  Patient ID and intended procedure confirmed with present staff. Received instructions for my participation in the procedure from the performing physician.  

## 2022-12-17 NOTE — Progress Notes (Signed)
Pt's states no medical or surgical changes since previsit or office visit. 

## 2022-12-17 NOTE — Telephone Encounter (Signed)
Pharmacy Patient Advocate Encounter   Received notification from [place] that prior authorization for REPATHA 140 MG/ML INJ is needed.    PA submitted on 12/17/22 Key [id number] Status is pending  Karie Soda, [place] Patient Advocate Specialist Direct Number: [phone] Fax: [phone]

## 2022-12-17 NOTE — Progress Notes (Signed)
Report given to PACU, vss 

## 2022-12-17 NOTE — Progress Notes (Signed)
1008 Robinul 0.1 mg IV given due large amount of secretions upon assessment.  MD made aware, vss 

## 2022-12-17 NOTE — Op Note (Signed)
[facility] Patient Name: [name] Procedure Date: 12/17/2022 10:07 AM MRN: [id number] Endoscopist: Gatha Mayer , MD, [id number] Age: 77 Referring MD:  Date of Birth: 05/29/1946 Gender: Male Account #: [id number] Procedure:                Upper GI endoscopy Indications:              Iron deficiency anemia, Positive celiac serologies Medicines:                Monitored Anesthesia Care Procedure:                Pre-Anesthesia Assessment:                           - Prior to the procedure, a History and Physical                            was performed, and patient medications and                            allergies were reviewed. The patient's tolerance of                            previous anesthesia was also reviewed. The risks                            and benefits of the procedure and the sedation                            options and risks were discussed with the patient.                            All questions were answered, and informed consent                            was obtained. Prior Anticoagulants: The patient has                            taken no anticoagulant or antiplatelet agents. ASA                            Grade Assessment: III - A patient with severe                            systemic disease. After reviewing the risks and                            benefits, the patient was deemed in satisfactory                            condition to undergo the procedure.                           After obtaining informed consent, the endoscope was  passed under direct vision. Throughout the                            procedure, the patient's blood pressure, pulse, and                            oxygen saturations were monitored continuously. The                            GIF [id number] #[id number] was introduced through the                            mouth, and advanced to the second part of duodenum.                            The  upper GI endoscopy was accomplished without                            difficulty. The patient tolerated the procedure                            well. Scope In: Scope Out: Findings:                 Diffuse mild inflammation characterized by                            erythema, granularity and mucus was found in the                            gastric antrum.                           Evidence of a prior Nissen fundoplication was found                            in the cardia. This was characterized by healthy                            appearing mucosa.                           The exam was otherwise without abnormality.                           The cardia and gastric fundus were otherwise normal                            on retroflexion.                           Biopsies for histology were taken with a cold                            forceps in the entire duodenum for evaluation of  celiac disease. Verification of patient                            identification for the specimen was done. Estimated                            blood loss was minimal. Complications:            No immediate complications. Estimated Blood Loss:     Estimated blood loss was minimal. Impression:               - Chronic gastritis. Mild as before.                           - A Nissen fundoplication was found, characterized                            by healthy appearing mucosa.                           - The examination was otherwise normal.                           - Biopsies were taken with a cold forceps for                            evaluation of celiac disease. Recommendation:           - Patient has a contact number available for                            emergencies. The signs and symptoms of potential                            delayed complications were discussed with the                            patient. Return to normal activities tomorrow.                             Written discharge instructions were provided to the                            patient.                           - Resume previous diet.                           - Continue present medications.                           - Await pathology results. Gatha Mayer, MD 12/17/2022 10:28:45 AM This report has been signed electronically.

## 2022-12-17 NOTE — Telephone Encounter (Signed)
Pharmacy Patient Advocate Encounter  Prior Authorization for REPATHA 140 MG/ML INJ has been approved.    Effective dates: 12/17/22 through 06/17/23  Karie Soda, [place] Patient Advocate Specialist Direct Number: [phone] Fax: [phone]

## 2022-12-18 ENCOUNTER — Telehealth: Payer: Self-pay

## 2022-12-18 NOTE — Telephone Encounter (Signed)
  Follow up Call-     12/17/2022    9:29 AM 02/09/2022    9:18 AM 01/24/2021    9:12 AM  Call back number  Post procedure Call Back phone  # [phone]  Permission to leave phone message Yes Yes Yes     Patient questions:  Do you have a fever, pain , or abdominal swelling? No. Pain Score  0 *  Have you tolerated food without any problem? yes  Have you been able to return to your normal activities? yes  Do you have any questions about your discharge instructions: Diet   No. Medications  No. Follow up visit  No.  Do you have questions or concerns about your Care? No.  Actions: * If pain score is 4 or above: No action needed, pain <4.

## 2022-12-19 ENCOUNTER — Other Ambulatory Visit: Payer: Self-pay

## 2022-12-19 ENCOUNTER — Inpatient Hospital Stay: Payer: Medicare Other

## 2022-12-19 VITALS — BP 125/65 | HR 78 | Resp 16

## 2022-12-19 DIAGNOSIS — K922 Gastrointestinal hemorrhage, unspecified: Secondary | ICD-10-CM | POA: Diagnosis not present

## 2022-12-19 DIAGNOSIS — D500 Iron deficiency anemia secondary to blood loss (chronic): Secondary | ICD-10-CM | POA: Diagnosis not present

## 2022-12-19 DIAGNOSIS — Z808 Family history of malignant neoplasm of other organs or systems: Secondary | ICD-10-CM | POA: Diagnosis not present

## 2022-12-19 DIAGNOSIS — Z87891 Personal history of nicotine dependence: Secondary | ICD-10-CM | POA: Diagnosis not present

## 2022-12-19 DIAGNOSIS — Z803 Family history of malignant neoplasm of breast: Secondary | ICD-10-CM | POA: Diagnosis not present

## 2022-12-19 MED ORDER — SODIUM CHLORIDE 0.9 % IV SOLN
200.0000 mg | Freq: Once | INTRAVENOUS | Status: AC
  Administered 2022-12-19: 200 mg via INTRAVENOUS
  Filled 2022-12-19: qty 200

## 2022-12-19 MED ORDER — SODIUM CHLORIDE 0.9 % IV SOLN: Freq: Once | INTRAVENOUS | Status: AC

## 2022-12-19 NOTE — Patient Instructions (Signed)

## 2022-12-21 ENCOUNTER — Inpatient Hospital Stay: Payer: Medicare Other

## 2022-12-21 VITALS — BP 121/60 | HR 72 | Temp 98.4°F | Resp 16

## 2022-12-21 DIAGNOSIS — D500 Iron deficiency anemia secondary to blood loss (chronic): Secondary | ICD-10-CM

## 2022-12-21 DIAGNOSIS — Z808 Family history of malignant neoplasm of other organs or systems: Secondary | ICD-10-CM | POA: Diagnosis not present

## 2022-12-21 DIAGNOSIS — K922 Gastrointestinal hemorrhage, unspecified: Secondary | ICD-10-CM | POA: Diagnosis not present

## 2022-12-21 DIAGNOSIS — Z87891 Personal history of nicotine dependence: Secondary | ICD-10-CM | POA: Diagnosis not present

## 2022-12-21 DIAGNOSIS — Z803 Family history of malignant neoplasm of breast: Secondary | ICD-10-CM | POA: Diagnosis not present

## 2022-12-21 MED ORDER — SODIUM CHLORIDE 0.9 % IV SOLN: Freq: Once | INTRAVENOUS | Status: AC

## 2022-12-21 MED ORDER — SODIUM CHLORIDE 0.9 % IV SOLN
200.0000 mg | Freq: Once | INTRAVENOUS | Status: AC
  Administered 2022-12-21: 200 mg via INTRAVENOUS
  Filled 2022-12-21: qty 200

## 2022-12-24 ENCOUNTER — Inpatient Hospital Stay: Payer: Medicare Other

## 2022-12-24 VITALS — BP 120/69 | HR 75 | Temp 97.7°F | Resp 16 | Wt 172.5 lb

## 2022-12-24 DIAGNOSIS — Z87891 Personal history of nicotine dependence: Secondary | ICD-10-CM | POA: Diagnosis not present

## 2022-12-24 DIAGNOSIS — D500 Iron deficiency anemia secondary to blood loss (chronic): Secondary | ICD-10-CM | POA: Diagnosis not present

## 2022-12-24 DIAGNOSIS — Z808 Family history of malignant neoplasm of other organs or systems: Secondary | ICD-10-CM | POA: Diagnosis not present

## 2022-12-24 DIAGNOSIS — K922 Gastrointestinal hemorrhage, unspecified: Secondary | ICD-10-CM | POA: Diagnosis not present

## 2022-12-24 DIAGNOSIS — Z803 Family history of malignant neoplasm of breast: Secondary | ICD-10-CM | POA: Diagnosis not present

## 2022-12-24 MED ORDER — SODIUM CHLORIDE 0.9 % IV SOLN: Freq: Once | INTRAVENOUS | Status: AC

## 2022-12-24 MED ORDER — SODIUM CHLORIDE 0.9 % IV SOLN
200.0000 mg | Freq: Once | INTRAVENOUS | Status: AC
  Administered 2022-12-24: 200 mg via INTRAVENOUS
  Filled 2022-12-24: qty 200

## 2022-12-24 NOTE — Patient Instructions (Signed)

## 2022-12-27 ENCOUNTER — Inpatient Hospital Stay: Payer: Medicare Other

## 2022-12-27 ENCOUNTER — Other Ambulatory Visit: Payer: Self-pay

## 2022-12-27 VITALS — BP 126/61 | HR 70 | Temp 98.0°F | Resp 16

## 2022-12-27 DIAGNOSIS — D500 Iron deficiency anemia secondary to blood loss (chronic): Secondary | ICD-10-CM

## 2022-12-27 DIAGNOSIS — K922 Gastrointestinal hemorrhage, unspecified: Secondary | ICD-10-CM | POA: Diagnosis not present

## 2022-12-27 DIAGNOSIS — Z87891 Personal history of nicotine dependence: Secondary | ICD-10-CM | POA: Diagnosis not present

## 2022-12-27 DIAGNOSIS — Z803 Family history of malignant neoplasm of breast: Secondary | ICD-10-CM | POA: Diagnosis not present

## 2022-12-27 DIAGNOSIS — Z808 Family history of malignant neoplasm of other organs or systems: Secondary | ICD-10-CM | POA: Diagnosis not present

## 2022-12-27 MED ORDER — SODIUM CHLORIDE 0.9 % IV SOLN
200.0000 mg | Freq: Once | INTRAVENOUS | Status: AC
  Administered 2022-12-27: 200 mg via INTRAVENOUS
  Filled 2022-12-27: qty 200

## 2022-12-27 MED ORDER — SODIUM CHLORIDE 0.9 % IV SOLN: Freq: Once | INTRAVENOUS | Status: AC

## 2022-12-27 NOTE — Progress Notes (Signed)
Patient declined to stay for 30 minute post infusion observation. Pt tolerated Iron well with vitals stable. Ambulated independently to lobby.

## 2022-12-27 NOTE — Patient Instructions (Signed)

## 2022-12-28 ENCOUNTER — Ambulatory Visit: Payer: Medicare Other | Admitting: Internal Medicine

## 2022-12-28 ENCOUNTER — Encounter: Payer: Self-pay | Admitting: Internal Medicine

## 2022-12-28 VITALS — BP 118/70 | HR 77 | Ht 67.0 in | Wt 172.0 lb

## 2022-12-28 DIAGNOSIS — L918 Other hypertrophic disorders of the skin: Secondary | ICD-10-CM | POA: Diagnosis not present

## 2022-12-28 DIAGNOSIS — N9489 Other specified conditions associated with female genital organs and menstrual cycle: Secondary | ICD-10-CM

## 2022-12-28 DIAGNOSIS — Z9889 Other specified postprocedural states: Secondary | ICD-10-CM

## 2022-12-28 DIAGNOSIS — K469 Unspecified abdominal hernia without obstruction or gangrene: Secondary | ICD-10-CM | POA: Diagnosis not present

## 2022-12-28 DIAGNOSIS — K580 Irritable bowel syndrome with diarrhea: Secondary | ICD-10-CM | POA: Diagnosis not present

## 2022-12-28 DIAGNOSIS — M6289 Other specified disorders of muscle: Secondary | ICD-10-CM

## 2022-12-28 DIAGNOSIS — N3010 Interstitial cystitis (chronic) without hematuria: Secondary | ICD-10-CM | POA: Diagnosis not present

## 2022-12-28 MED ORDER — FAMOTIDINE 20 MG PO TABS: 20.0000 mg | ORAL_TABLET | Freq: Two times a day (BID) | ORAL | 5 refills | Status: DC

## 2022-12-28 NOTE — Progress Notes (Signed)
Erik MERGEL Sr. 77 y.o. [birth year] [id number]  Assessment & Plan:   Encounter Diagnoses  Name Primary?   Irritable bowel syndrome with diarrhea Yes   High-tone pelvic floor dysfunction    Peristomal hernia    History of ileal conduit    Interstitial cystitis    Skin tag    His problems are chronic and recurrent and have not responded to numerous therapies as outlined in previous charts.  He does not want to take medications at this time.  I had suggested retrying amitriptyline but he does not want to.  Perhaps his pantoprazole is leading to increased stool frequency so we will try famotidine 20 mg twice daily instead for his GERD symptoms and to see if that changes bowel frequency and the irritation that seems to be causing in the abdomen and rectum.  Regarding his parastomal hernia, I am deferring to the patient and surgery on that.  He will have to decide if surgical repair is worth it assuming a surgeon would do it.  The skin tag on the left buttock is bothersome and he thinks removing that would provide great relief.  He may ask a Psychologist, sport and exercise about that.  Return to GI as needed though unfortunately I am not optimistic that I can provide him with relief.  Subjective:   Chief Complaint: Chronic abdominal pain and rectal pain  HPI 18 year oldwhite man with a complicated medical history including interstitial cystitis and high tone pelvic floor dysfunction status post cystectomy and ileal conduit/ureterostomy, here with complaints of burning suprapubic and lower abdominal pain and rectal pain associated with frequent defecation of mostly formed stools.  This is the same problem he had in January 2023 but he says things may be worse.  He has been on numerous neuromodulators, he has been through pelvic floor physical therapy numerous times, and he continues to have these problems.  Last year I had him do a fecal calprotectin and it was less than 16.  Same issues though he does not seem  to recall.  Last year he was in the [facility] more than once with bleeding, most recently in October where he had a normal colonoscopy, a CT angio showed bleeding at the site of his ileal surgery, angiogram by IR could not find bleeding.  It stopped.  His anticoagulants have been discontinued and there is been no recurrence.  Colonoscopy 09/04/22 Findings:      The perianal and digital rectal examinations were normal. Pertinent       negatives include normal sphincter tone and no palpable rectal lesions.       Non-bleeding internal hemorrhoids were found during retroflexion. The       hemorrhoids were medium-sized and Grade I (internal hemorrhoids that do       not prolapse).       The terminal ileum appeared normal and was examined up to 10 cm.       The exam was otherwise without abnormality on direct and retroflexion       views.       Many small and large-mouthed diverticula were found in the left colon.     Allergies  Allergen Reactions   Ambien [Zolpidem]     Unknown reaction   Bentyl [Dicyclomine]     Memory loss, anxiety, blurred vision   Carafate [Sucralfate] Nausea And Vomiting   Lansoprazole Diarrhea   Lexapro [Escitalopram Oxalate]     Unknown reaction    Other  Pt states he is sensitive to all oral medications   Pneumovax [Pneumococcal Polysaccharide Vaccine] Other (See Comments)    pain   Prilosec [Omeprazole] Other (See Comments)    Per patient joint pain with PPI's   Remeron [Mirtazapine]     Unknown reaction    Seroquel [Quetiapine] Other (See Comments)    Unknown reaction    Statins Other (See Comments)    Joint pain   Tizanidine Other (See Comments)    Made patient "feel crazy"   Tramadol Other (See Comments)    Pt can take small amounts up to twice daily but continued use causes chest / abdominal pain   Tylenol [Acetaminophen] Nausea And Vomiting    Can only take for 2 takes then he starts to have pain   Current Meds  Medication Sig    Camphor-Eucalyptus-Menthol (VICKS VAPORUB EX) Apply 1 Application topically daily as needed (congestion).   Pseudoeph-Doxylamine-DM-APAP (NYQUIL D COLD/FLU PO) Take by mouth.   traMADol (ULTRAM) 50 MG tablet TAKE 1 TABLET BY MOUTH TWICE A DAY AS NEEDED   Past Medical History:  Diagnosis Date   Allergic rhinitis    Allergy    Anal fissure    Anemia    Anxiety    Blood transfusion without reported diagnosis 12/06/2022   also 2 in the past   Cataract    starting   Coronary atherosclerosis of native coronary artery 2007   nonobstructive CAD by cath with 40% mid-LAD stenosis   Degenerative arthritis of knee, bilateral 10/09/2017   Depression    Diverticulosis 12/18/2011   Diverticulosis of colon with hemorrhage    April 2021, [facility]   Duodenal mass    Fibromyalgia    GERD with stricture    Helicobacter pylori (H. 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   Vitamin D deficiency 11/29/2017   Past Surgical History:  Procedure Laterality Date   ADENOIDECTOMY     bladder distention     x 6   CARDIAC CATHETERIZATION     CATHETER REMOVAL     super pubic area   COLONOSCOPY     CORONARY STENT INTERVENTION N/A 05/29/2022   Procedure: CORONARY STENT INTERVENTION;  Surgeon: Martinique, Peter M, MD;  Location: [facility];  Service: Cardiovascular;  Laterality: N/A;   DENTAL SURGERY Right    #30 tooth extraction (right upper)   ESOPHAGOGASTRODUODENOSCOPY  12/17/2022    INTERSTIM IMPLANT PLACEMENT     INTERSTIM IMPLANT REMOVAL     KNEE ARTHROSCOPY     right x 2   LEFT HEART CATH AND CORONARY ANGIOGRAPHY N/A 05/29/2022   Procedure: LEFT HEART CATH AND CORONARY ANGIOGRAPHY;  Surgeon: Martinique, Peter M, MD;  Location: [facility] [facility];  Service: Cardiovascular;  Laterality: N/A;   NISSEN FUNDOPLICATION  [id number]   PROSTECTOMY     ROBOT ASSISTED LAPAROSCOPIC COMPLETE CYSTECT ILEAL CONDUIT  SHOULDER ARTHROSCOPY  2010   left   TONSILLECTOMY AND ADENOIDECTOMY  1957   TOTAL KNEE ARTHROPLASTY Right 09/08/2021   Procedure: TOTAL KNEE ARTHROPLASTY;  Surgeon: Susa Day, MD;  Location: [facility];  Service: Orthopedics;  Laterality: Right;   TRANSURETHRAL RESECTION OF PROSTATE     x 2   UPPER GASTROINTESTINAL ENDOSCOPY  05/13/2008   hiatal hernia   VASECTOMY     Social History   Social History Narrative   Married, retired for children   He is a former smoker no alcohol or substance use   family history includes Allergic rhinitis in his mother; Arthritis in his mother; Breast cancer in his sister; Colon polyps in his father; Diabetes in his father, paternal uncle, and sister; Heart disease in his father and mother; Hypertension in his father and mother; Liver cancer in his maternal grandfather and paternal uncle; Stroke in his mother.   Review of Systems As per HPI  Objective:   Physical Exam BP 118/70   Pulse 77   Ht 5' 7"$  (1.702 m)   Wt 172 lb (78 kg)   BMI 26.94 kg/m  Elderly white man no acute distress Abdominal exam shows some slight protuberance around the ostomy worse when standing, nontender, no mass and he has an ostomy draining urine in the right lower quadrant.  Perianal exam shows a skin tag lesion that he says is sensitive to touch without obvious excoriation or irritation several centimeters from the anus towards the posterior thigh direction.  Digital rectal exam is normal.  Perianal exam is otherwise normal. Abd - ostomy ok??hernia  NT  Data reviewed as per HPI I have reviewed general surgery notes from [facility], I have also reviewed the hospitalization records from [facility] last year.  Including imaging and colonoscopy reports etc.

## 2022-12-28 NOTE — Patient Instructions (Addendum)
Stop your pantoprazole.  We have sent the following medications to your pharmacy for you to pick up at your convenience: Famotidine  Follow up with surgery about your Hernia and perianal lesion.  _______________________________________________________  If your blood pressure at your visit was 140/90 or greater, please contact your primary care physician to follow up on this.  _______________________________________________________  If you are age 76 or older, your body mass index should be between 23-30. Your Body mass index is 26.94 kg/m. If this is out of the aforementioned range listed, please consider follow up with your Primary Care Provider.  If you are age 32 or younger, your body mass index should be between 19-25. Your Body mass index is 26.94 kg/m. If this is out of the aformentioned range listed, please consider follow up with your Primary Care Provider.   ________________________________________________________  The [facility] providers would like to encourage you to use [facility] to communicate with providers for non-urgent requests or questions.  Due to long hold times on the telephone, sending your provider a message by [facility] may be a faster and more efficient way to get a response.  Please allow 48 business hours for a response.  Please remember that this is for non-urgent requests.  _______________________________________________________  I appreciate the opportunity to care for you. Silvano Rusk, MD, [facility]

## 2022-12-31 ENCOUNTER — Encounter: Payer: Self-pay | Admitting: Internal Medicine

## 2023-01-01 DIAGNOSIS — Z8619 Personal history of other infectious and parasitic diseases: Secondary | ICD-10-CM | POA: Diagnosis not present

## 2023-01-01 DIAGNOSIS — K435 Parastomal hernia without obstruction or  gangrene: Secondary | ICD-10-CM | POA: Diagnosis not present

## 2023-01-01 DIAGNOSIS — R197 Diarrhea, unspecified: Secondary | ICD-10-CM | POA: Diagnosis not present

## 2023-01-07 ENCOUNTER — Ambulatory Visit: Payer: Medicare Other | Admitting: Family Medicine

## 2023-01-07 ENCOUNTER — Other Ambulatory Visit: Payer: Self-pay

## 2023-01-07 ENCOUNTER — Encounter: Payer: Self-pay | Admitting: Family Medicine

## 2023-01-07 VITALS — BP 128/70 | HR 87 | Temp 98.2°F | Resp 20

## 2023-01-07 DIAGNOSIS — L230 Allergic contact dermatitis due to metals: Secondary | ICD-10-CM

## 2023-01-07 NOTE — Progress Notes (Signed)
Follow-up Note  RE: ISON GAIDA Sr. MRN: [id number] DOB: Jul 30, 1946 Date of Office Visit: 01/07/2023  Primary care provider: [name], MD Referring provider: [name], MD   Kalev returns to the office today for the patch test placement, given suspected history of contact dermatitis. He was last seen in this clinic on 12/04/2022 by Dr. Ernst Bowler for evaluation of allergic rhinitis, chronic cough with globus sensation mainly occurring at night, reflux on Protonix, and irritation and rash in the roof of his mouth. He reports that he had a crown placed 16 years ago. They are interested in metals testing. He is accompanied by his wife who assists with history. Overall, he reports that he is feeling well with no cardiopulmonary, gastrointestinal or integumentary symptoms with the exception of the rash in his mouth. His current medications are listed in the chart.  Diagnostics: Metal patches placed  Metals Patch     Time Antigen Placed  2:55    Location  Back    Number of Test  11    Reading Interval  Day- Placed today    Chromium chloride 1%  0     Cobalt chloride hexahydrate 1%  0     Molybdenum chloride 0.5%  0     Nickel sulfate hexahydrate 5%  0     Potassium dichromate 0.25%  0     Copper sulfate pentahydrate 2%  0     Titanium 0.1%  0     Manganese chloride 0.5%  0     Tantal 1%  0    Vanadium pentoxide 10%  0   Aluminum hydroxide 10%  0    Comments  N/A      Plan:   Allergic contact dermatitis Metal Patches placed today. Please avoid strenuous physical activities and do not get the patches on the back wet. No showering until final patch reading done. Okay to take antihistamines for itching but avoid placing any creams on the back where the patches are. We will remove the patches on Wednesday and will do our initial read. Then you will come back on Friday for a final read   Call the clinic if this treatment plan is not working well for you  Follow up in 2 days  or sooner if needed.

## 2023-01-07 NOTE — Patient Instructions (Addendum)
Diagnostics: Metal patches placed  Metals Patch     Time Antigen Placed  2:55    Location  Back    Number of Test  11    Reading Interval  Day- Placed today    Chromium chloride 1%  0     Cobalt chloride hexahydrate 1%  0     Molybdenum chloride 0.5%  0     Nickel sulfate hexahydrate 5%  0     Potassium dichromate 0.25%  0     Copper sulfate pentahydrate 2%  0     Titanium 0.1%  0     Manganese chloride 0.5%  0     Tantal 1%  0    Vanadium pentoxide 10%  0   Aluminum hydroxide 10%  0    Comments  N/A   Allergic contact dermatitis Metal Patches placed today. Please avoid strenuous physical activities and do not get the patches on the back wet. No showering until final patch reading done. Okay to take antihistamines for itching but avoid placing any creams on the back where the patches are. We will remove the patches on Wednesday and will do our initial read. Then you will come back on Friday for a final read   Call the clinic if this treatment plan is not working well for you  Follow up in 2 days or sooner if needed.

## 2023-01-09 ENCOUNTER — Encounter: Payer: Self-pay | Admitting: Cardiovascular Disease

## 2023-01-09 ENCOUNTER — Ambulatory Visit: Payer: Medicare Other | Admitting: Family

## 2023-01-09 ENCOUNTER — Encounter: Payer: Self-pay | Admitting: Family

## 2023-01-09 DIAGNOSIS — L230 Allergic contact dermatitis due to metals: Secondary | ICD-10-CM

## 2023-01-09 DIAGNOSIS — K580 Irritable bowel syndrome with diarrhea: Secondary | ICD-10-CM | POA: Diagnosis not present

## 2023-01-09 DIAGNOSIS — A048 Other specified bacterial intestinal infections: Secondary | ICD-10-CM | POA: Diagnosis not present

## 2023-01-09 DIAGNOSIS — R197 Diarrhea, unspecified: Secondary | ICD-10-CM | POA: Diagnosis not present

## 2023-01-09 NOTE — Progress Notes (Signed)
Prabhnoor returns to the office today for the final patch test interpretation, given suspected history of contact dermatitis.    Diagnostics:   Metal patch 48-hour hour reading: all negative   Plan:   Allergic contact dermatitis - 48 hour reading for metal patch test : all negative - Make sure to return for your readings on Friday and Monday  Althea Charon, [place] [facility] [place]

## 2023-01-09 NOTE — Telephone Encounter (Signed)
Spoke with pt who complains of pain on the inside of his right elbow x 7-10 days.  He reports pain is identical to pain he had prior to stenting.  He reports some mild lightheadedness as well.  He states BP and HR have been WNL.  He denies current CP, SOB or dizziness/fainting.  Pt last seen by Dr Audie Box 11/2022. Pt advised will forward to Dr Audie Box and his nurse for further review and recommendation.  Reviewed ED precautions.  Pt verbalizes understanding and agrees with current plan.

## 2023-01-10 ENCOUNTER — Inpatient Hospital Stay (HOSPITAL_BASED_OUTPATIENT_CLINIC_OR_DEPARTMENT_OTHER): Payer: Medicare Other | Admitting: Hematology and Oncology

## 2023-01-10 ENCOUNTER — Other Ambulatory Visit: Payer: Self-pay

## 2023-01-10 ENCOUNTER — Inpatient Hospital Stay: Payer: Medicare Other

## 2023-01-10 VITALS — BP 107/61 | HR 90 | Temp 97.9°F | Resp 16 | Ht 67.0 in | Wt 170.6 lb

## 2023-01-10 DIAGNOSIS — D500 Iron deficiency anemia secondary to blood loss (chronic): Secondary | ICD-10-CM

## 2023-01-10 DIAGNOSIS — K922 Gastrointestinal hemorrhage, unspecified: Secondary | ICD-10-CM | POA: Diagnosis not present

## 2023-01-10 DIAGNOSIS — Z808 Family history of malignant neoplasm of other organs or systems: Secondary | ICD-10-CM | POA: Diagnosis not present

## 2023-01-10 DIAGNOSIS — Z87891 Personal history of nicotine dependence: Secondary | ICD-10-CM | POA: Diagnosis not present

## 2023-01-10 DIAGNOSIS — Z803 Family history of malignant neoplasm of breast: Secondary | ICD-10-CM | POA: Diagnosis not present

## 2023-01-10 LAB — IRON AND IRON BINDING CAPACITY (CC-WL,HP ONLY)
Iron: 71 ug/dL (ref 45–182)
Saturation Ratios: 21 % (ref 17.9–39.5)
TIBC: 346 ug/dL (ref 250–450)
UIBC: 275 ug/dL

## 2023-01-10 LAB — CBC WITH DIFFERENTIAL/PLATELET
Abs Immature Granulocytes: 0.02 10*3/uL (ref 0.00–0.07)
Basophils Absolute: 0 10*3/uL (ref 0.0–0.1)
Basophils Relative: 1 %
Eosinophils Absolute: 0.1 10*3/uL (ref 0.0–0.5)
Eosinophils Relative: 1 %
HCT: 43.1 % (ref 39.0–52.0)
Hemoglobin: 14 g/dL (ref 13.0–17.0)
Immature Granulocytes: 0 %
Lymphocytes Relative: 23 %
Lymphs Abs: 1.8 10*3/uL (ref 0.7–4.0)
MCH: 25.6 pg — ABNORMAL LOW (ref 26.0–34.0)
MCHC: 32.5 g/dL (ref 30.0–36.0)
MCV: 78.9 fL — ABNORMAL LOW (ref 80.0–100.0)
Monocytes Absolute: 0.9 10*3/uL (ref 0.1–1.0)
Monocytes Relative: 11 %
Neutro Abs: 4.9 10*3/uL (ref 1.7–7.7)
Neutrophils Relative %: 64 %
Platelets: 311 10*3/uL (ref 150–400)
RBC: 5.46 MIL/uL (ref 4.22–5.81)
RDW: 25 % — ABNORMAL HIGH (ref 11.5–15.5)
WBC: 7.7 10*3/uL (ref 4.0–10.5)
nRBC: 0 % (ref 0.0–0.2)

## 2023-01-10 LAB — RETICULOCYTES
Immature Retic Fract: 10.1 % (ref 2.3–15.9)
RBC.: 5.45 MIL/uL (ref 4.22–5.81)
Retic Count, Absolute: 38.7 10*3/uL (ref 19.0–186.0)
Retic Ct Pct: 0.7 % (ref 0.4–3.1)

## 2023-01-10 NOTE — Progress Notes (Signed)
[facility] NOTE  Patient Care Team: [name], MD as PCP - General (Internal Medicine) O'Neal, Cassie Freer, MD as PCP - Cardiology (Cardiology) [name], MD as Attending Physician (Internal Medicine) Domingo Pulse, MD (Urology) Phylliss Blakes, OD as Consulting Physician (Optometry)  CHIEF COMPLAINTS/PURPOSE OF CONSULTATION:  Iron deficiency anemia  ASSESSMENT & PLAN:   Iron deficiency anemia due to chronic blood loss  This is a very pleasant 77 year old male patient with past medical history significant for GI bleed referred to hematology for further recommendations regarding his persistent iron deficiency and severe anemia.  Since his last visit, he had blood transfusion and iron infusion. He tolerated it well. He denies any more bleeding from his stoma, just tiny bit of blood. He feels much better today, fatigue, dizziness , heart racing everything improved. No concerns on exam. CBC from today with complete correction of anemia. Since he had GI bleed resulting in anemia, we can see every 6 months or PRN Husband and wife had some good questions about etiology of the anemia.  Thank you for consulting on the care of this patient.  Please not hesitate to contact us with any additional questions or concerns.   No orders of the defined types were placed in this encounter. Total time spent: 30 minutes including history and physical, review of records, counseling and coordination of care  HISTORY OF PRESENTING ILLNESS:  [name] Sr. 77 y.o. male is here because of Iron deficiency  This is a very pleasant 77 year old male patient with multiple medical comorbidities, GI bleed last year who presents to hematology for further recommendations regarding his iron deficiency. He was last seen in August 2022 when he was reluctant to consider IV iron and wanted to try iron supplement. He was then lost to follow up. Since he last was here, had a major GI bleed  in Oct of 2023 while he was on Aspirin and Plavix for CAD and stent. He has some pinkish stool from time to time, and some black stools from time to time. Since his last visit here he had blood transfusion and iron infusion.  He feels remarkably better.  No more shortness of breath, palpitations or positional dizziness.  He however is curious as to why he has this bleeding from time to time.  He also reports some nausea, apparently cannot tolerate Prilosec, Protonix, Pepcid.  Rest of the pertinent 10 point ROS reviewed and neg.  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   Vitamin D deficiency 11/29/2017    SURGICAL HISTORY: Past Surgical History:  Procedure Laterality Date   ADENOIDECTOMY     bladder distention     x 6   CARDIAC CATHETERIZATION     CATHETER REMOVAL     super pubic  area   COLONOSCOPY     CORONARY STENT INTERVENTION N/A 05/29/2022   Procedure: CORONARY STENT INTERVENTION;  Surgeon: Martinique, Peter M, MD;  Location: [facility];  Service: Cardiovascular;  Laterality: N/A;   DENTAL SURGERY Right    #30 tooth extraction (right upper)   ESOPHAGOGASTRODUODENOSCOPY  12/17/2022   INTERSTIM IMPLANT PLACEMENT     INTERSTIM IMPLANT REMOVAL     KNEE ARTHROSCOPY     right x 2   LEFT HEART CATH AND CORONARY ANGIOGRAPHY N/A 05/29/2022   Procedure: LEFT HEART CATH AND CORONARY ANGIOGRAPHY;  Surgeon: Martinique, Peter M, MD;  Location: [facility];  Service: Cardiovascular;  Laterality: N/A;   NISSEN FUNDOPLICATION  [id number]   PROSTECTOMY     ROBOT ASSISTED LAPAROSCOPIC COMPLETE CYSTECT ILEAL CONDUIT     SHOULDER ARTHROSCOPY  2010   left   TONSILLECTOMY AND ADENOIDECTOMY  1957   TOTAL KNEE ARTHROPLASTY Right 09/08/2021   Procedure: TOTAL KNEE ARTHROPLASTY;  Surgeon: Susa Day, MD;  Location: [facility];  Service: Orthopedics;  Laterality: Right;   TRANSURETHRAL RESECTION OF PROSTATE     x 2   UPPER GASTROINTESTINAL ENDOSCOPY  05/13/2008   hiatal hernia   VASECTOMY      SOCIAL HISTORY: Social History   Socioeconomic History   Marital status: Married    Spouse name: Not on file   Number of children: 4   Years of education: Not on file   Highest education level: Not on file  Occupational History   Occupation: retired  Tobacco Use   Smoking status: Former    Passive exposure: Past   Smokeless tobacco: Never   Tobacco comments:    quit 1980  Vaping Use   Vaping Use: Never used  Substance and Sexual Activity   Alcohol use: No    Alcohol/week: 0.0 standard drinks of alcohol   Drug use: No   Sexual activity: Yes    Partners: Female  Other Topics Concern   Not on file  Social History Narrative   Married, retired for children   He is a former smoker no alcohol or substance use   Social Determinants of Radio broadcast assistant Strain:  Low Risk  (12/08/2021)   Overall Financial Resource Strain (CARDIA)    Difficulty of Paying Living Expenses: Not hard at all  Food Insecurity: No Food Insecurity (12/08/2021)   Hunger Vital Sign    Worried About Running Out of Food in the Last Year: Never true    [place] in the Last Year: Never true  Transportation Needs: No Transportation Needs (12/08/2021)   PRAPARE - Hydrologist (Medical): No    Lack of Transportation (Non-Medical): No  Physical Activity: Insufficiently Active (08/28/2022)   Exercise Vital Sign    Days of Exercise per Week: 2 days    Minutes  of Exercise per Session: 10 min  Stress: Stress Concern Present (09/11/2022)   [place]    Feeling of Stress : Rather much  Social Connections: Moderately Integrated (12/08/2021)   Social Connection and Isolation Panel [NHANES]    Frequency of Communication with Friends and Family: Three times a week    Frequency of Social Gatherings with Friends and Family: Three times a week    Attends Religious Services: More than 4 times per year    Active Member of Clubs or Organizations: No    Attends Archivist Meetings: Never    Marital Status: Married  Human resources officer Violence: Not At Risk (12/08/2021)   Humiliation, Afraid, Rape, and Kick questionnaire    Fear of Current or Ex-Partner: No    Emotionally Abused: No    Physically Abused: No    Sexually Abused: No    FAMILY HISTORY: Family History  Problem Relation Age of Onset   Allergic rhinitis Mother    Heart disease Mother    Hypertension Mother    Stroke Mother    Arthritis Mother    Colon polyps Father    Diabetes Father    Heart disease Father    Hypertension Father    Diabetes Sister    Breast cancer Sister    Diabetes Paternal Uncle    Liver cancer Paternal Uncle    Liver cancer Maternal Grandfather    Colon cancer Neg Hx    Esophageal cancer Neg Hx     Rectal cancer Neg Hx    Stomach cancer Neg Hx    Pancreatic cancer Neg Hx     ALLERGIES:  is allergic to [place] [zolpidem], bentyl [dicyclomine], carafate [sucralfate], lansoprazole, lexapro [escitalopram oxalate], other, pneumovax [pneumococcal polysaccharide vaccine], prilosec [omeprazole], remeron [mirtazapine], seroquel [quetiapine], statins, tizanidine, tramadol, and tylenol [acetaminophen].  MEDICATIONS:  Current Outpatient Medications  Medication Sig Dispense Refill   amitriptyline (ELAVIL) 50 MG tablet as needed for sleep. (Patient not taking: Reported on 12/28/2022)     benzonatate (TESSALON PERLES) 100 MG capsule Take 1 capsule (100 mg total) by mouth 3 (three) times daily as needed for cough. 20 capsule 0   Camphor-Eucalyptus-Menthol (VICKS VAPORUB EX) Apply 1 Application topically daily as needed (congestion).     diclofenac Sodium (VOLTAREN) 1 % GEL Apply 1 Application topically 4 (four) times daily as needed (pain).     diphenhydrAMINE HCl, Sleep, (ZZZQUIL) 25 MG CAPS Take 2 capsules by mouth at bedtime as needed. (Patient not taking: Reported on 12/28/2022)     diphenoxylate-atropine (LOMOTIL) 2.5-0.025 MG tablet Take 1 tablet by mouth 4 (four) times daily as needed for diarrhea or loose stools.     famotidine (PEPCID) 20 MG tablet Take 1 tablet (20 mg total) by mouth 2 (two) times daily. (Patient not taking: Reported on 01/07/2023) 60 tablet 5   LORazepam (ATIVAN) 0.5 MG tablet Take 0.5 tablets (0.25 mg total) by mouth 2 (two) times daily as needed for anxiety. 30 tablet 2   nitroGLYCERIN (NITROSTAT) 0.4 MG SL tablet Place 1 tablet (0.4 mg total) under the tongue every 5 (five) minutes as needed for chest pain. 25 tablet 3   Pseudoeph-Doxylamine-DM-APAP (NYQUIL D COLD/FLU PO) Take by mouth. (Patient not taking: Reported on 01/07/2023)     traMADol (ULTRAM) 50 MG tablet TAKE 1 TABLET BY MOUTH TWICE A DAY AS NEEDED (Patient not taking: Reported on 01/07/2023) 120 tablet 2   No  current facility-administered medications for this  visit.     PHYSICAL EXAMINATION: ECOG PERFORMANCE STATUS: 1 - Symptomatic but completely ambulatory  Vitals:   01/10/23 1514  BP: 107/61  Pulse: 90  Resp: 16  Temp: 97.9 F (36.6 C)  SpO2: 97%    Filed Weights   01/10/23 1514  Weight: 170 lb 9.6 oz (77.4 kg)   GENERAL:alert, no distress and comfortable Neck: palpable lymphadenopathy Chest: CTA bilaterally Heart: RRR PSYCH: alert & oriented x 3 with fluent speech NEURO: no focal motor/sensory deficits  LABORATORY DATA:  I have reviewed the data as listed Lab Results  Component Value Date   WBC 7.7 01/10/2023   HGB 14.0 01/10/2023   HCT 43.1 01/10/2023   MCV 78.9 (L) 01/10/2023   PLT 311 01/10/2023     Chemistry      Component Value Date/Time   NA 138 11/28/2022 1612   NA 139 05/18/2022 1023   K 4.8 11/28/2022 1612   CL 104 11/28/2022 1612   CO2 26 11/28/2022 1612   BUN 20 11/28/2022 1612   BUN 22 05/18/2022 1023   CREATININE 1.37 11/28/2022 1612      Component Value Date/Time   CALCIUM 9.1 11/28/2022 1612   ALKPHOS 64 11/28/2022 1612   AST 13 11/28/2022 1612   ALT 11 11/28/2022 1612   BILITOT 0.3 11/28/2022 1612     CBC from today shows complete resolution of the anemia hemoglobin 14 g/dL, normal white blood cell count and platelet count.  Rest of the labs pending.  Patient requested and asked to add hemoglobin A1c.  RADIOGRAPHIC STUDIES: I have personally reviewed the radiological images as listed and agreed with the findings in the report. No results found.  All questions were answered. The patient knows to call the clinic with any problems, questions or concerns. I spent 30 minutes in the care of this patient including H and P, review of records, counseling and coordination of care.     Benay Pike, MD 01/10/2023 4:05 PM

## 2023-01-10 NOTE — Progress Notes (Signed)
    Follow-up Note  RE: [name]. MRN: [id number] DOB: 1946/03/22 Date of Office Visit: 01/11/2023  Primary care provider: [name], MD Referring provider: [name], MD   [name] returns to the office today for the final patch test interpretation, given suspected history of contact dermatitis.    Diagnostics:   Metals Patch     Time Antigen Placed  01/07/2023    Location  Back    Number of Test  11    Reading Interval  Day 5    Chromium chloride 1%  0     Cobalt chloride hexahydrate 1%  0     Molybdenum chloride 0.5%  0     Nickel sulfate hexahydrate 5%  0     Potassium dichromate 0.25%  0     Copper sulfate pentahydrate 2%  0     Titanium 0.1%  0     Manganese chloride 0.5%  0     Tantal 1%  0    Vanadium pentoxide 10%  0   Aluminum hydroxide 10%  0    Comments  N/A   Plan:   Allergic contact dermatitis Metal patch testing negative at today's visit.  Call the clinic if this treatment plan is not working well for you  Follow up on Monday or sooner if needed.  Thank you for the opportunity to care for this patient.  Please do not hesitate to contact me with questions.  Gareth Morgan, FNP [facility]

## 2023-01-11 ENCOUNTER — Ambulatory Visit: Payer: Medicare Other | Admitting: Family Medicine

## 2023-01-11 ENCOUNTER — Encounter: Payer: Self-pay | Admitting: Family Medicine

## 2023-01-11 DIAGNOSIS — L230 Allergic contact dermatitis due to metals: Secondary | ICD-10-CM

## 2023-01-11 LAB — HEMOGLOBIN A1C
Hgb A1c MFr Bld: 6 % — ABNORMAL HIGH (ref 4.8–5.6)
Mean Plasma Glucose: 126 mg/dL

## 2023-01-11 LAB — FERRITIN: Ferritin: 173 ng/mL (ref 24–336)

## 2023-01-11 NOTE — Patient Instructions (Signed)
Diagnostics:   Metals Patch     Time Antigen Placed  01/07/2023    Location  Back    Number of Test  11    Reading Interval  Day 5    Chromium chloride 1%  0     Cobalt chloride hexahydrate 1%  0     Molybdenum chloride 0.5%  0     Nickel sulfate hexahydrate 5%  0     Potassium dichromate 0.25%  0     Copper sulfate pentahydrate 2%  0     Titanium 0.1%  0     Manganese chloride 0.5%  0     Tantal 1%  0    Vanadium pentoxide 10%  0   Aluminum hydroxide 10%  0    Comments  N/A   Plan:   Allergic contact dermatitis Metal patch testing negative at today's visit.  Call the clinic if this treatment plan is not working well for you  Follow up on Monday or sooner if needed.

## 2023-01-14 ENCOUNTER — Ambulatory Visit: Payer: Medicare Other | Admitting: Family

## 2023-01-14 ENCOUNTER — Encounter: Payer: Self-pay | Admitting: Family

## 2023-01-14 DIAGNOSIS — L230 Allergic contact dermatitis due to metals: Secondary | ICD-10-CM

## 2023-01-14 DIAGNOSIS — K1370 Unspecified lesions of oral mucosa: Secondary | ICD-10-CM

## 2023-01-14 NOTE — Progress Notes (Signed)
[name] returns to the office today for the final patch test interpretation, given suspected history of contact dermatitis.    Diagnostics:   TRUE TEST  1 week  reading: Diagnostics:   Metals Patch     Time Antigen Placed  01/07/2023    Location  Back    Number of Test  11    Reading Interval  Day 7    Chromium chloride 1%  0     Cobalt chloride hexahydrate 1%  0     Molybdenum chloride 0.5%  0     Nickel sulfate hexahydrate 5%  0     Potassium dichromate 0.25%  0     Copper sulfate pentahydrate 2%  0     Titanium 0.1%  0     Manganese chloride 0.5%  0     Tantal 1%  0    Vanadium pentoxide 10%  0   Aluminum hydroxide 10%  0    Comments  N/A   Plan:   Allergic contact dermatitis Metal patch testing negative at today's visit.  Call the clinic if this treatment plan is not working well for you  Follow up in 3 months or sooner if needed.   Althea Charon, FNP [facility]

## 2023-01-15 ENCOUNTER — Encounter: Payer: Self-pay | Admitting: Hematology and Oncology

## 2023-01-16 DIAGNOSIS — Z932 Ileostomy status: Secondary | ICD-10-CM | POA: Diagnosis not present

## 2023-01-17 ENCOUNTER — Telehealth: Payer: Self-pay

## 2023-01-17 NOTE — Telephone Encounter (Signed)
Per patient request, faxed office notes and metal patch testing results to [facility] Attn: Dr. Cain Saupe.

## 2023-01-22 ENCOUNTER — Encounter: Payer: Self-pay | Admitting: Internal Medicine

## 2023-01-23 ENCOUNTER — Ambulatory Visit (INDEPENDENT_AMBULATORY_CARE_PROVIDER_SITE_OTHER): Payer: Medicare Other

## 2023-01-23 ENCOUNTER — Ambulatory Visit (INDEPENDENT_AMBULATORY_CARE_PROVIDER_SITE_OTHER): Payer: Medicare Other | Admitting: Internal Medicine

## 2023-01-23 VITALS — BP 122/66 | HR 94 | Temp 98.0°F | Ht 67.0 in | Wt 166.4 lb

## 2023-01-23 DIAGNOSIS — R140 Abdominal distension (gaseous): Secondary | ICD-10-CM | POA: Diagnosis not present

## 2023-01-23 DIAGNOSIS — R110 Nausea: Secondary | ICD-10-CM

## 2023-01-23 DIAGNOSIS — R5383 Other fatigue: Secondary | ICD-10-CM

## 2023-01-23 DIAGNOSIS — R8290 Unspecified abnormal findings in urine: Secondary | ICD-10-CM | POA: Diagnosis not present

## 2023-01-23 MED ORDER — ONDANSETRON 4 MG PO TBDP: 4.0000 mg | ORAL_TABLET | Freq: Three times a day (TID) | ORAL | 0 refills | Status: DC | PRN

## 2023-01-23 MED ORDER — CEPHALEXIN 500 MG PO CAPS: 500.0000 mg | ORAL_CAPSULE | Freq: Three times a day (TID) | ORAL | 0 refills | Status: DC

## 2023-01-23 NOTE — Progress Notes (Signed)
Patient ID: Erik Lolling Sr., male   DOB: [birth year], 77 y.o.   MRN: [id number]        Chief Complaint: follow up fatigue exhaustion, cloudy urine, nausea and bloating, cloudy urine       HPI:  Erik Lolling Sr. is a 77 y.o. male here with c/o 2-3 days fatigue with exhaustion, hard to put one foot in front of the other for unclear reason, denies fever, chills but has cloudy urine worsening as well from urostomy.  Denies urinary symptoms such as dysuria, frequency, urgency, flank pain, hematuria or vomiting, fever, chills, but has ahd mild worsening nausea and assocated bloating.  Denies worsening reflux, other abd pain, dysphagia, bowel change or blood.        Wt Readings from Last 3 Encounters:  01/24/23 166 lb (75.3 kg)  01/23/23 166 lb 6 oz (75.5 kg)  01/10/23 170 lb 9.6 oz (77.4 kg)   BP Readings from Last 3 Encounters:  01/24/23 128/76  01/23/23 122/66  01/10/23 107/61         Past Medical History:  Diagnosis Date   Allergic rhinitis    Allergy    Anal fissure    Anemia    Anxiety    Blood transfusion without reported diagnosis 12/06/2022   also 2 in the past   Cataract    starting   Coronary atherosclerosis of native coronary artery 2007   nonobstructive CAD by cath with 40% mid-LAD stenosis   Degenerative arthritis of knee, bilateral 10/09/2017   Depression    Diverticulosis 12/18/2011   Diverticulosis of colon with hemorrhage    April 2021, [facility]   Duodenal mass    Fibromyalgia    GERD with stricture    Helicobacter pylori (H. 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   Vitamin D deficiency 11/29/2017   Past Surgical History:  Procedure Laterality Date   ADENOIDECTOMY     bladder distention     x 6   CARDIAC CATHETERIZATION     CATHETER REMOVAL     super pubic area   COLONOSCOPY     CORONARY STENT INTERVENTION N/A 05/29/2022   Procedure: CORONARY STENT INTERVENTION;  Surgeon: Martinique, Peter M, MD;  Location: [facility];  Service: Cardiovascular;  Laterality: N/A;   DENTAL SURGERY Right    #30 tooth extraction (right upper)   ESOPHAGOGASTRODUODENOSCOPY  12/17/2022   INTERSTIM IMPLANT PLACEMENT     INTERSTIM IMPLANT REMOVAL     KNEE ARTHROSCOPY     right x 2   LEFT HEART CATH AND CORONARY ANGIOGRAPHY N/A 05/29/2022   Procedure: LEFT HEART CATH AND CORONARY ANGIOGRAPHY;  Surgeon: Martinique, Peter M, MD;  Location: [facility];  Service: Cardiovascular;  Laterality: N/A;   NISSEN FUNDOPLICATION  [id number]   PROSTECTOMY     ROBOT ASSISTED LAPAROSCOPIC COMPLETE CYSTECT ILEAL CONDUIT  SHOULDER ARTHROSCOPY  2010   left   TONSILLECTOMY AND ADENOIDECTOMY  1957   TOTAL KNEE ARTHROPLASTY Right 09/08/2021   Procedure: TOTAL KNEE ARTHROPLASTY;  Surgeon: Susa Day, MD;  Location: [facility];  Service: Orthopedics;  Laterality: Right;   TRANSURETHRAL RESECTION OF PROSTATE     x 2   UPPER GASTROINTESTINAL ENDOSCOPY  05/13/2008   hiatal hernia   VASECTOMY      reports that he has quit smoking. He has been exposed to tobacco smoke. He has never used smokeless tobacco. He reports that he does not drink alcohol and does not use drugs. family history includes Allergic rhinitis in his mother; Arthritis in his mother; Breast cancer in his sister; Colon polyps in his father; Diabetes in his father, paternal uncle, and sister; Heart disease in his father and mother; Hypertension in his father and mother; Liver cancer in his  maternal grandfather and paternal uncle; Stroke in his mother. Allergies  Allergen Reactions   Ambien [Zolpidem]     Unknown reaction   Bentyl [Dicyclomine]     Memory loss, anxiety, blurred vision   Carafate [Sucralfate] Nausea And Vomiting   Lansoprazole Diarrhea   Lexapro [Escitalopram Oxalate]     Unknown reaction    Other     Pt states he is sensitive to all oral medications   Pneumovax [Pneumococcal Polysaccharide Vaccine] Other (See Comments)    pain   Prilosec [Omeprazole] Other (See Comments)    Per patient joint pain with PPI's   Remeron [Mirtazapine]     Unknown reaction    Seroquel [Quetiapine] Other (See Comments)    Unknown reaction    Statins Other (See Comments)    Joint pain   Tizanidine Other (See Comments)    Made patient "feel crazy"   Tramadol Other (See Comments)    Pt can take small amounts up to twice daily but continued use causes chest / abdominal pain   Tylenol [Acetaminophen] Nausea And Vomiting    Can only take for 2 takes then he starts to have pain   Current Outpatient Medications on File Prior to Visit  Medication Sig Dispense Refill   benzonatate (TESSALON PERLES) 100 MG capsule Take 1 capsule (100 mg total) by mouth 3 (three) times daily as needed for cough. 20 capsule 0   calcium carbonate (ANTACID) 750 MG chewable tablet Chew 1 tablet by mouth daily.     Camphor-Eucalyptus-Menthol (VICKS VAPORUB EX) Apply 1 Application topically daily as needed (congestion).     diclofenac Sodium (VOLTAREN) 1 % GEL Apply 1 Application topically 4 (four) times daily as needed (pain).     diphenhydrAMINE HCl, Sleep, (ZZZQUIL) 25 MG CAPS Take 2 capsules by mouth at bedtime as needed.     diphenoxylate-atropine (LOMOTIL) 2.5-0.025 MG tablet Take 1 tablet by mouth 4 (four) times daily as needed for diarrhea or loose stools.     famotidine (ACID REDUCER) 10 MG tablet Take 10 mg by mouth 2 (two) times daily.     LORazepam (ATIVAN) 0.5 MG tablet Take 0.5 tablets  (0.25 mg total) by mouth 2 (two) times daily as needed for anxiety. 30 tablet 2   Pseudoeph-Doxylamine-DM-APAP (NYQUIL D COLD/FLU PO) Take by mouth.     traMADol (ULTRAM) 50 MG tablet TAKE 1 TABLET BY MOUTH TWICE A DAY AS NEEDED 120 tablet 2   famotidine (PEPCID) 20 MG tablet Take 1 tablet (20 mg total) by mouth 2 (two) times daily. 60 tablet 5   MAGNESIUM PO Take by  mouth.     nitroGLYCERIN (NITROSTAT) 0.4 MG SL tablet Place 1 tablet (0.4 mg total) under the tongue every 5 (five) minutes as needed for chest pain. 25 tablet 3   No current facility-administered medications on file prior to visit.        ROS:  All others reviewed and negative.  Objective        PE:  BP 122/66   Pulse 94   Temp 98 F (36.7 C) (Oral)   Ht 5\' 7"  (1.702 m)   Wt 166 lb 6 oz (75.5 kg)   SpO2 98%   BMI 26.06 kg/m                 Constitutional: Pt appears in NAD               HENT: Head: NCAT.                Right Ear: External ear normal.                 Left Ear: External ear normal.                Eyes: . Pupils are equal, round, and reactive to light. Conjunctivae and EOM are normal               Nose: without d/c or deformity               Neck: Neck supple. Gross normal ROM               Cardiovascular: Normal rate and regular rhythm.                 Pulmonary/Chest: Effort normal and breath sounds without rales or wheezing.                Abd:  Soft, NT, ? Mild distension, + BS, no organomegaly               Neurological: Pt is alert. At baseline orientation, motor grossly intact               Skin: Skin is warm. No rashes, no other new lesions, LE edema - none               Psychiatric: Pt behavior is normal without agitation   Micro: none  Cardiac tracings I have personally interpreted today:  none  Pertinent Radiological findings (summarize): none   Lab Results  Component Value Date   WBC 9.8 01/23/2023   HGB 15.4 01/23/2023   HCT 47.6 01/23/2023   PLT 356.0 01/23/2023   GLUCOSE 85  01/23/2023   CHOL 170 11/28/2022   TRIG 167.0 (H) 11/28/2022   HDL 42.00 11/28/2022   LDLDIRECT 169.0 06/24/2018   LDLCALC 94 11/28/2022   ALT 12 01/23/2023   AST 15 01/23/2023   NA 135 01/23/2023   K 4.8 01/23/2023   CL 100 01/23/2023   CREATININE 1.41 01/23/2023   BUN 20 01/23/2023   CO2 28 01/23/2023   TSH 2.76 01/23/2023   PSA 0.00 (L) 11/28/2022   HGBA1C 6.0 (H) 01/10/2023   Assessment/Plan:  Erik Lolling Sr. is a 77 y.o. White or Caucasian [1] male with  has a past medical history of Allergic rhinitis, Allergy, Anal fissure, Anemia, Anxiety, Blood transfusion without reported diagnosis (12/06/2022), Cataract, Coronary atherosclerosis of native coronary artery (2007), Degenerative arthritis of knee, bilateral (10/09/2017), Depression, Diverticulosis (12/18/2011), Diverticulosis of colon with hemorrhage, Duodenal mass, Fibromyalgia,  GERD with stricture, Helicobacter pylori (H. 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  Cloudy urine Can't r/o uti infection - for cephalexin 500 tid,  Nausea Mild to mod, for zofran prn,,  to f/u any worsening symptoms or concerns   Bloating Significance unclear, for abd film cxr,  to f/u any worsening symptoms or concerns  Fatigue Etiology unclear, for lab including cbc with labs  Followup: Return in about 3 months (around 04/25/2023).  Cathlean Cower, MD 01/26/2023 7:16 AM [facility]

## 2023-01-23 NOTE — Patient Instructions (Addendum)
Please take all new medication as prescribed- nausea medication, and antibiotic  Please continue all other medications as before, and refills have been done if requested.  Please have the pharmacy call with any other refills you may need.  Please keep your appointments with your specialists as you may have planned  Please go to the XRAY Department in the first floor for the x-ray testing  Please go to the LAB at the blood drawing area for the tests to be done  You will be contacted by phone if any changes need to be made immediately.  Otherwise, you will receive a letter about your results with an explanation, but please check with MyChart first.  Please remember to sign up for MyChart if you have not done so, as this will be important to you in the future with finding out test results, communicating by private email, and scheduling acute appointments online when needed.  OK to CANCEL the appt for tomorrow  Please make an Appointment to return in 3 months, or sooner if needed

## 2023-01-24 ENCOUNTER — Encounter: Payer: Self-pay | Admitting: Internal Medicine

## 2023-01-24 ENCOUNTER — Ambulatory Visit (INDEPENDENT_AMBULATORY_CARE_PROVIDER_SITE_OTHER): Payer: Medicare Other | Admitting: Internal Medicine

## 2023-01-24 VITALS — BP 128/76 | HR 67 | Temp 97.9°F | Ht 67.0 in | Wt 166.0 lb

## 2023-01-24 DIAGNOSIS — R8290 Unspecified abnormal findings in urine: Secondary | ICD-10-CM

## 2023-01-24 DIAGNOSIS — Z0001 Encounter for general adult medical examination with abnormal findings: Secondary | ICD-10-CM

## 2023-01-24 DIAGNOSIS — R194 Change in bowel habit: Secondary | ICD-10-CM

## 2023-01-24 DIAGNOSIS — K219 Gastro-esophageal reflux disease without esophagitis: Secondary | ICD-10-CM

## 2023-01-24 DIAGNOSIS — K222 Esophageal obstruction: Secondary | ICD-10-CM

## 2023-01-24 DIAGNOSIS — N179 Acute kidney failure, unspecified: Secondary | ICD-10-CM | POA: Diagnosis not present

## 2023-01-24 LAB — CBC WITH DIFFERENTIAL/PLATELET
Basophils Absolute: 0.1 10*3/uL (ref 0.0–0.1)
Basophils Relative: 0.8 % (ref 0.0–3.0)
Eosinophils Absolute: 0.1 10*3/uL (ref 0.0–0.7)
Eosinophils Relative: 0.6 % (ref 0.0–5.0)
HCT: 47.6 % (ref 39.0–52.0)
Hemoglobin: 15.4 g/dL (ref 13.0–17.0)
Lymphocytes Relative: 30.7 % (ref 12.0–46.0)
Lymphs Abs: 3 10*3/uL (ref 0.7–4.0)
MCHC: 32.2 g/dL (ref 30.0–36.0)
MCV: 81.8 fl (ref 78.0–100.0)
Monocytes Absolute: 0.9 10*3/uL (ref 0.1–1.0)
Monocytes Relative: 9 % (ref 3.0–12.0)
Neutro Abs: 5.8 10*3/uL (ref 1.4–7.7)
Neutrophils Relative %: 58.9 % (ref 43.0–77.0)
Platelets: 356 10*3/uL (ref 150.0–400.0)
RBC: 5.83 Mil/uL — ABNORMAL HIGH (ref 4.22–5.81)
RDW: 28.5 % — ABNORMAL HIGH (ref 11.5–15.5)
WBC: 9.8 10*3/uL (ref 4.0–10.5)

## 2023-01-24 LAB — BASIC METABOLIC PANEL
BUN: 20 mg/dL (ref 6–23)
CO2: 28 mEq/L (ref 19–32)
Calcium: 9.8 mg/dL (ref 8.4–10.5)
Chloride: 100 mEq/L (ref 96–112)
Creatinine, Ser: 1.41 mg/dL (ref 0.40–1.50)
GFR: 48.62 mL/min — ABNORMAL LOW (ref >60.00)
Glucose, Bld: 85 mg/dL (ref 70–99)
Potassium: 4.8 mEq/L (ref 3.5–5.1)
Sodium: 135 mEq/L (ref 135–145)

## 2023-01-24 LAB — HEPATIC FUNCTION PANEL
ALT: 12 U/L (ref 0–53)
AST: 15 U/L (ref 0–37)
Albumin: 4.3 g/dL (ref 3.5–5.2)
Alkaline Phosphatase: 65 U/L (ref 39–117)
Bilirubin, Direct: 0 mg/dL (ref 0.0–0.3)
Total Bilirubin: 0.4 mg/dL (ref 0.2–1.2)
Total Protein: 8 g/dL (ref 6.0–8.3)

## 2023-01-24 LAB — TSH: TSH: 2.76 u[IU]/mL (ref 0.35–5.50)

## 2023-01-24 MED ORDER — PANTOPRAZOLE SODIUM 20 MG PO TBEC: 20.0000 mg | DELAYED_RELEASE_TABLET | Freq: Every day | ORAL | 3 refills | Status: DC

## 2023-01-24 NOTE — Patient Instructions (Addendum)
Ok to take the lower dose protonix to hopefully help the diarrhea  OK to take the metamucil daily, and the Boost for nutrition  Please try to drink more fluids over the next few days  Please continue all other medications as before, and refills have been done if requested.  Please have the pharmacy call with any other refills you may need.  Please continue your efforts at being more active, low cholesterol diet, and weight control.  You are otherwise up to date with prevention measures today.  Please keep your appointments with your specialists as you may have planned  We will try to let you know about the xray results  Please make an Appointment to return in 6 months, or sooner if needed

## 2023-01-24 NOTE — Progress Notes (Signed)
Patient ID: Erik Lolling Sr., male   DOB: 1946/04/11, 77 y.o.   MRN: [id number]         Chief Complaint:: wellness exam and follow up fatigue, bloating, diarrhea, gerd, mild AKI       HPI:  Erik FREERKSEN Sr. is a 77 y.o. male here for wellness exam; for shingrix at the pharmacy, declines flu shot, o/w up to date                        Also Pt denies chest pain, increased sob or doe, wheezing, orthopnea, PND, increased LE swelling, palpitations, dizziness or syncope.   Pt denies polydipsia, polyuria, or new focal neuro s/s.    Pt denies fever, wt loss, night sweats, loss of appetite, or other constitutional symptoms  Fatigue and exhaustion from yesterday seems some improved, better energy today.  Has recent diarrhea he attributes to protonix 40 mg, asking for lower dose.  Acute abd series pending from yesterday.  Has had some reduced po intake recently as well but will try to do better.     Wt Readings from Last 3 Encounters:  01/24/23 166 lb (75.3 kg)  01/23/23 166 lb 6 oz (75.5 kg)  01/10/23 170 lb 9.6 oz (77.4 kg)   BP Readings from Last 3 Encounters:  01/24/23 128/76  01/23/23 122/66  01/10/23 107/61   Immunization History  Administered Date(s) Administered   Moderna Sars-Covid-2 Vaccination 01/11/2020, 02/08/2020   Pneumococcal Polysaccharide-23 07/03/2013   Pneumococcal-Unspecified 11/12/2012   Tdap 08/23/2014   Health Maintenance Due  Topic Date Due   Zoster Vaccines- Shingrix (1 of 2) Never done   [employer] Annual Wellness (AWV)  12/08/2022      Past Medical History:  Diagnosis Date   Allergic rhinitis    Allergy    Anal fissure    Anemia    Anxiety    Blood transfusion without reported diagnosis 12/06/2022   also 2 in the past   Cataract    starting   Coronary atherosclerosis of native coronary artery 2007   nonobstructive CAD by cath with 40% mid-LAD stenosis   Degenerative arthritis of knee, bilateral 10/09/2017   Depression    Diverticulosis 12/18/2011    Diverticulosis of colon with hemorrhage    April 2021, [facility] healthcare   Duodenal mass    Fibromyalgia    GERD with stricture    Helicobacter pylori (H. 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   Vitamin D deficiency 11/29/2017   Past Surgical History:  Procedure Laterality Date   ADENOIDECTOMY     bladder distention     x 6   CARDIAC CATHETERIZATION     CATHETER REMOVAL     super pubic area   COLONOSCOPY     CORONARY STENT INTERVENTION N/A 05/29/2022   Procedure: CORONARY STENT INTERVENTION;  Surgeon: Martinique, Peter M, MD;  Location: [facility];  Service: Cardiovascular;  Laterality: N/A;   DENTAL SURGERY Right    #30 tooth extraction (right upper)   ESOPHAGOGASTRODUODENOSCOPY  12/17/2022   INTERSTIM IMPLANT PLACEMENT     INTERSTIM IMPLANT REMOVAL     KNEE ARTHROSCOPY     right x 2   LEFT HEART CATH AND CORONARY ANGIOGRAPHY N/A 05/29/2022   Procedure: LEFT HEART CATH AND CORONARY ANGIOGRAPHY;  Surgeon: Martinique, Peter M, MD;  Location: [facility];  Service: Cardiovascular;  Laterality: N/A;   NISSEN FUNDOPLICATION  123XX123   PROSTECTOMY     ROBOT ASSISTED LAPAROSCOPIC COMPLETE CYSTECT ILEAL CONDUIT     SHOULDER ARTHROSCOPY  2010   left   TONSILLECTOMY AND ADENOIDECTOMY  1957   TOTAL KNEE ARTHROPLASTY Right 09/08/2021   Procedure: TOTAL KNEE ARTHROPLASTY;  Surgeon: Susa Day, MD;  Location: [facility];  Service: Orthopedics;  Laterality: Right;    TRANSURETHRAL RESECTION OF PROSTATE     x 2   UPPER GASTROINTESTINAL ENDOSCOPY  05/13/2008   hiatal hernia   VASECTOMY      reports that he has quit smoking. He has been exposed to tobacco smoke. He has never used smokeless tobacco. He reports that he does not drink alcohol and does not use drugs. family history includes Allergic rhinitis in his mother; Arthritis in his mother; Breast cancer in his sister; Colon polyps in his father; Diabetes in his father, paternal uncle, and sister; Heart disease in his father and mother; Hypertension in his father and mother; Liver cancer in his maternal grandfather and paternal uncle; Stroke in his mother. Allergies  Allergen Reactions   Ambien [Zolpidem]     Unknown reaction   Bentyl [Dicyclomine]     Memory loss, anxiety, blurred vision   Carafate [Sucralfate] Nausea And Vomiting   Lansoprazole Diarrhea   Lexapro [Escitalopram Oxalate]     Unknown reaction    Other     Pt states he is sensitive to all oral medications   Pneumovax [Pneumococcal Polysaccharide Vaccine] Other (See Comments)    pain   Prilosec [Omeprazole] Other (See Comments)    Per patient joint pain with PPI's   Remeron [Mirtazapine]     Unknown reaction    Seroquel [Quetiapine] Other (See Comments)    Unknown reaction    Statins Other (See Comments)    Joint pain   Tizanidine Other (See Comments)    Made patient "feel crazy"   Tramadol Other (See Comments)    Pt can take small amounts up to twice daily but continued use causes chest / abdominal pain   Tylenol [Acetaminophen] Nausea And Vomiting    Can only take for 2 takes then he starts to have pain   Current Outpatient Medications on File Prior to Visit  Medication Sig Dispense Refill   benzonatate (TESSALON PERLES) 100 MG capsule Take 1 capsule (100 mg total) by mouth 3 (three) times daily as needed for cough. 20 capsule 0   calcium carbonate (ANTACID) 750 MG chewable tablet Chew 1 tablet by mouth daily.      Camphor-Eucalyptus-Menthol (VICKS VAPORUB EX) Apply 1 Application topically daily as needed (congestion).     cephALEXin (KEFLEX) 500 MG capsule Take 1 capsule (500 mg total) by mouth 3 (three) times daily. 30 capsule 0   diclofenac Sodium (VOLTAREN) 1 % GEL Apply 1 Application topically 4 (four) times daily as needed (pain).     diphenhydrAMINE HCl, Sleep, (ZZZQUIL) 25 MG CAPS Take 2 capsules by  mouth at bedtime as needed.     diphenoxylate-atropine (LOMOTIL) 2.5-0.025 MG tablet Take 1 tablet by mouth 4 (four) times daily as needed for diarrhea or loose stools.     famotidine (ACID REDUCER) 10 MG tablet Take 10 mg by mouth 2 (two) times daily.     famotidine (PEPCID) 20 MG tablet Take 1 tablet (20 mg total) by mouth 2 (two) times daily. 60 tablet 5   LORazepam (ATIVAN) 0.5 MG tablet Take 0.5 tablets (0.25 mg total) by mouth 2 (two) times daily as needed for anxiety. 30 tablet 2   MAGNESIUM PO Take by mouth.     ondansetron (ZOFRAN-ODT) 4 MG disintegrating tablet Take 1 tablet (4 mg total) by mouth every 8 (eight) hours as needed for nausea or vomiting. 30 tablet 0   Pseudoeph-Doxylamine-DM-APAP (NYQUIL D COLD/FLU PO) Take by mouth.     traMADol (ULTRAM) 50 MG tablet TAKE 1 TABLET BY MOUTH TWICE A DAY AS NEEDED 120 tablet 2   nitroGLYCERIN (NITROSTAT) 0.4 MG SL tablet Place 1 tablet (0.4 mg total) under the tongue every 5 (five) minutes as needed for chest pain. 25 tablet 3   No current facility-administered medications on file prior to visit.        ROS:  All others reviewed and negative.  Objective        PE:  BP 128/76   Pulse 67   Temp 97.9 F (36.6 C) (Oral)   Ht 5\' 7"  (1.702 m)   Wt 166 lb (75.3 kg)   SpO2 95%   BMI 26.00 kg/m                 Constitutional: Pt appears in NAD               HENT: Head: NCAT.                Right Ear: External ear normal.                 Left Ear: External ear normal.                Eyes: . Pupils are equal, round, and reactive to light.  Conjunctivae and EOM are normal               Nose: without d/c or deformity               Neck: Neck supple. Gross normal ROM               Cardiovascular: Normal rate and regular rhythm.                 Pulmonary/Chest: Effort normal and breath sounds without rales or wheezing.                Abd:  Soft, NT, ND, + BS, no organomegaly               Neurological: Pt is alert. At baseline orientation, motor grossly intact               Skin: Skin is warm. No rashes, no other new lesions, LE edema - none               Psychiatric: Pt behavior is normal without agitation   Micro: none  Cardiac tracings I have personally interpreted today:  none  Pertinent Radiological findings (summarize): none   Lab Results  Component Value Date   WBC 9.8 01/23/2023   HGB 15.4  01/23/2023   HCT 47.6 01/23/2023   PLT 356.0 01/23/2023   GLUCOSE 85 01/23/2023   CHOL 170 11/28/2022   TRIG 167.0 (H) 11/28/2022   HDL 42.00 11/28/2022   LDLDIRECT 169.0 06/24/2018   LDLCALC 94 11/28/2022   ALT 12 01/23/2023   AST 15 01/23/2023   NA 135 01/23/2023   K 4.8 01/23/2023   CL 100 01/23/2023   CREATININE 1.41 01/23/2023   BUN 20 01/23/2023   CO2 28 01/23/2023   TSH 2.76 01/23/2023   PSA 0.00 (L) 11/28/2022   HGBA1C 6.0 (H) 01/10/2023   Assessment/Plan:  Erik Lolling Sr. is a 77 y.o. White or Caucasian [1] male with  has a past medical history of Allergic rhinitis, Allergy, Anal fissure, Anemia, Anxiety, Blood transfusion without reported diagnosis (12/06/2022), Cataract, Coronary atherosclerosis of native coronary artery (2007), Degenerative arthritis of knee, bilateral (10/09/2017), Depression, Diverticulosis (12/18/2011), Diverticulosis of colon with hemorrhage, Duodenal mass, Fibromyalgia, GERD with stricture, Helicobacter pylori (H. pylori) infection (11/25/2012), Hiatal hernia, HLD (hyperlipidemia), HTN (hypertension), Hyperlipidemia, IBS (irritable bowel syndrome), Impotence of organic origin,  Insomnia, Internal and external hemorrhoids without complication, Interstitial cystitis, Irritable bowel syndrome (06/09/2010), Osteoporosis, Pelvic floor dysfunction (11/29/2017), Pneumonia, Reactive hypoglycemia, Rheumatoid arthritis(714.0), Somatization disorder (02/27/2012), and Vitamin D deficiency (11/29/2017).  Encounter for well adult exam with abnormal findings Age and sex appropriate education and counseling updated with regular exercise and diet Referrals for preventative services - none needed Immunizations addressed - for shingrix at pharmacy, declines flu shot Smoking counseling  - none needed Evidence for depression or other mood disorder - none significant Most recent labs reviewed. I have personally reviewed and have noted: 1) the patient's medical and social history 2) The patient's current medications and supplements 3) The patient's height, weight, and BMI have been recorded in the chart   Cloudy urine Persistent today, continue cephalexin empiric, increase po fluids  AKI (acute kidney injury) ([place]) Mild, for increase po intake fluids, f/u lab next visit  Frequent bowel movements Had recent GI eval and GI panel at [facility] last wk negative, now afeb, continue to follow, hopeful to improve after reduced dose protonix 20 mg qd  GERD with stricture For decreased protonix 20 mg qd due to pt concern about higher dose protonix 40 mg  Followup: Return in about 6 months (around 07/27/2023).  Cathlean Cower, MD 01/27/2023 6:40 AM [facility]

## 2023-01-25 ENCOUNTER — Encounter: Payer: Self-pay | Admitting: Internal Medicine

## 2023-01-26 ENCOUNTER — Encounter: Payer: Self-pay | Admitting: Internal Medicine

## 2023-01-26 DIAGNOSIS — R8290 Unspecified abnormal findings in urine: Secondary | ICD-10-CM | POA: Insufficient documentation

## 2023-01-26 NOTE — Assessment & Plan Note (Signed)
Can't r/o uti infection - for cephalexin 500 tid,

## 2023-01-26 NOTE — Assessment & Plan Note (Signed)
Etiology unclear, for lab including cbc with labs

## 2023-01-26 NOTE — Addendum Note (Signed)
Addended by: [name] on: 01/26/2023 05:12 PM   Modules accepted: Level of Service

## 2023-01-26 NOTE — Assessment & Plan Note (Signed)
Significance unclear, for abd film cxr,  to f/u any worsening symptoms or concerns

## 2023-01-26 NOTE — Assessment & Plan Note (Signed)
Mild to mod, for zofran prn,,  to f/u any worsening symptoms or concerns

## 2023-01-27 ENCOUNTER — Encounter: Payer: Self-pay | Admitting: Internal Medicine

## 2023-01-27 DIAGNOSIS — N179 Acute kidney failure, unspecified: Secondary | ICD-10-CM | POA: Insufficient documentation

## 2023-01-27 NOTE — Assessment & Plan Note (Signed)
Age and sex appropriate education and counseling updated with regular exercise and diet Referrals for preventative services - none needed Immunizations addressed - for shingrix at pharmacy, declines flu shot Smoking counseling  - none needed Evidence for depression or other mood disorder - none significant Most recent labs reviewed. I have personally reviewed and have noted: 1) the patient's medical and social history 2) The patient's current medications and supplements 3) The patient's height, weight, and BMI have been recorded in the chart

## 2023-01-27 NOTE — Assessment & Plan Note (Signed)
Persistent today, continue cephalexin empiric, increase po fluids

## 2023-01-27 NOTE — Assessment & Plan Note (Signed)
Mild, for increase po intake fluids, f/u lab next visit

## 2023-01-27 NOTE — Assessment & Plan Note (Signed)
For decreased protonix 20 mg qd due to pt concern about higher dose protonix 40 mg

## 2023-01-27 NOTE — Assessment & Plan Note (Signed)
Had recent GI eval and GI panel at [facility] last wk negative, now afeb, continue to follow, hopeful to improve after reduced dose protonix 20 mg qd

## 2023-01-28 ENCOUNTER — Encounter: Payer: Self-pay | Admitting: Internal Medicine

## 2023-01-28 ENCOUNTER — Encounter: Payer: Self-pay | Admitting: Cardiovascular Disease

## 2023-01-28 MED ORDER — ISOSORBIDE MONONITRATE ER 30 MG PO TB24: 30.0000 mg | ORAL_TABLET | Freq: Every day | ORAL | 2 refills | Status: DC

## 2023-01-29 ENCOUNTER — Telehealth: Payer: Self-pay

## 2023-01-29 NOTE — Telephone Encounter (Signed)
[place]. to schedule their annual wellness visit. Appointment made for 02/05/23.  Norton Blizzard, [place] ([facility])  [place] [facility] [phone]

## 2023-02-02 ENCOUNTER — Encounter (HOSPITAL_BASED_OUTPATIENT_CLINIC_OR_DEPARTMENT_OTHER): Payer: Self-pay | Admitting: Cardiovascular Disease

## 2023-02-05 ENCOUNTER — Ambulatory Visit (INDEPENDENT_AMBULATORY_CARE_PROVIDER_SITE_OTHER): Payer: Medicare Other

## 2023-02-05 VITALS — Ht 67.0 in | Wt 166.0 lb

## 2023-02-05 DIAGNOSIS — R410 Disorientation, unspecified: Secondary | ICD-10-CM | POA: Diagnosis not present

## 2023-02-05 DIAGNOSIS — F419 Anxiety disorder, unspecified: Secondary | ICD-10-CM | POA: Diagnosis not present

## 2023-02-05 DIAGNOSIS — Z0000 Encounter for general adult medical examination without abnormal findings: Secondary | ICD-10-CM | POA: Diagnosis not present

## 2023-02-05 DIAGNOSIS — F32A Depression, unspecified: Secondary | ICD-10-CM

## 2023-02-05 NOTE — Progress Notes (Signed)
Subjective:   Erik Lolling Sr. is a 77 y.o. male who presents for Medicare Annual/Subsequent preventive examination.  I connected with  Erik Lolling Sr. on 02/05/23 by a audio enabled telemedicine application and verified that I am speaking with the correct person using two identifiers.  Patient Location: Home  Provider Location: Home Office  I discussed the limitations of evaluation and management by telemedicine. The patient expressed understanding and agreed to proceed.  Review of Systems     Cardiac Risk Factors include: advanced age (>56men, >44 women);male gender     Objective:    Today's Vitals   02/05/23 2133  Weight: 166 lb (75.3 kg)  Height: 5\' 7"  (1.702 m)   Body mass index is 26 kg/m.     02/05/2023    9:39 PM 05/29/2022    7:59 AM 12/08/2021   10:41 AM 09/09/2021   12:00 AM 09/08/2021    5:40 PM 09/08/2021   11:31 AM 08/31/2021    8:20 AM  Advanced Directives  Does Patient Have a Medical Advance Directive? Yes Yes Yes Yes  Yes Yes  Type of Advance Directive Living will;Healthcare Power of [place];Living will [place];Living will [place];Living will [place];Living will [place];Living will  Does patient want to make changes to medical advance directive? No - Patient declined No - Patient declined  No - Patient declined     Copy of [place] in Chart? Yes - validated most recent copy scanned in chart (See row information) Yes - validated most recent copy scanned in chart (See row information) No - copy requested No - copy requested No - copy requested Yes - validated most recent copy scanned in chart (See row information) No - copy requested    Current Medications (verified) Outpatient Encounter Medications as of 02/05/2023  Medication Sig   LORazepam (ATIVAN) 0.5 MG tablet Take 0.5 tablets (0.25 mg  total) by mouth 2 (two) times daily as needed for anxiety.   pantoprazole (PROTONIX) 20 MG tablet Take 1 tablet (20 mg total) by mouth daily.   [DISCONTINUED] benzonatate (TESSALON PERLES) 100 MG capsule Take 1 capsule (100 mg total) by mouth 3 (three) times daily as needed for cough.   [DISCONTINUED] calcium carbonate (ANTACID) 750 MG chewable tablet Chew 1 tablet by mouth daily.   [DISCONTINUED] Camphor-Eucalyptus-Menthol (VICKS VAPORUB EX) Apply 1 Application topically daily as needed (congestion).   [DISCONTINUED] cephALEXin (KEFLEX) 500 MG capsule Take 1 capsule (500 mg total) by mouth 3 (three) times daily.   [DISCONTINUED] diclofenac Sodium (VOLTAREN) 1 % GEL Apply 1 Application topically 4 (four) times daily as needed (pain).   [DISCONTINUED] diphenhydrAMINE HCl, Sleep, (ZZZQUIL) 25 MG CAPS Take 2 capsules by mouth at bedtime as needed.   [DISCONTINUED] diphenoxylate-atropine (LOMOTIL) 2.5-0.025 MG tablet Take 1 tablet by mouth 4 (four) times daily as needed for diarrhea or loose stools.   [DISCONTINUED] famotidine (ACID REDUCER) 10 MG tablet Take 10 mg by mouth 2 (two) times daily.   [DISCONTINUED] famotidine (PEPCID) 20 MG tablet Take 1 tablet (20 mg total) by mouth 2 (two) times daily.   [DISCONTINUED] isosorbide mononitrate (IMDUR) 30 MG 24 hr tablet Take 1 tablet (30 mg total) by mouth daily.   [DISCONTINUED] MAGNESIUM PO Take by mouth.   [DISCONTINUED] nitroGLYCERIN (NITROSTAT) 0.4 MG SL tablet Place 1 tablet (0.4 mg total) under the tongue every 5 (five) minutes  as needed for chest pain.   [DISCONTINUED] ondansetron (ZOFRAN-ODT) 4 MG disintegrating tablet Take 1 tablet (4 mg total) by mouth every 8 (eight) hours as needed for nausea or vomiting.   [DISCONTINUED] Pseudoeph-Doxylamine-DM-APAP (NYQUIL D COLD/FLU PO) Take by mouth.   [DISCONTINUED] traMADol (ULTRAM) 50 MG tablet TAKE 1 TABLET BY MOUTH TWICE A DAY AS NEEDED   No facility-administered encounter medications on file as of  02/05/2023.    Allergies (verified) Ambien [zolpidem], Bentyl [dicyclomine], Carafate [sucralfate], Lansoprazole, Lexapro [escitalopram oxalate], Other, Pneumovax [pneumococcal polysaccharide vaccine], Prilosec [omeprazole], Remeron [mirtazapine], Seroquel [quetiapine], Statins, Tizanidine, Tramadol, and Tylenol [acetaminophen]   History: Past Medical History:  Diagnosis Date   Allergic rhinitis    Allergy    Anal fissure    Anemia    Anxiety    Blood transfusion without reported diagnosis 12/06/2022   also 2 in the past   Cataract    starting   Coronary atherosclerosis of native coronary artery 2007   nonobstructive CAD by cath with 40% mid-LAD stenosis   Degenerative arthritis of knee, 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   Vitamin D deficiency 11/29/2017   Past Surgical History:  Procedure Laterality Date   ADENOIDECTOMY     bladder distention     x 6   CARDIAC CATHETERIZATION     CATHETER REMOVAL     super pubic area   COLONOSCOPY     CORONARY STENT INTERVENTION N/A 05/29/2022   Procedure: CORONARY STENT INTERVENTION;  Surgeon: Martinique, Peter M, MD;  Location: [place] [facility];  Service: Cardiovascular;  Laterality: N/A;   DENTAL SURGERY  Right    #30 tooth extraction (right upper)   ESOPHAGOGASTRODUODENOSCOPY  12/17/2022   INTERSTIM IMPLANT PLACEMENT     INTERSTIM IMPLANT REMOVAL     KNEE ARTHROSCOPY     right x 2   LEFT HEART CATH AND CORONARY ANGIOGRAPHY N/A 05/29/2022   Procedure: LEFT HEART CATH AND CORONARY ANGIOGRAPHY;  Surgeon: Martinique, Peter M, MD;  Location: [facility];  Service: Cardiovascular;  Laterality: N/A;   NISSEN FUNDOPLICATION  [id number]   PROSTECTOMY     ROBOT ASSISTED LAPAROSCOPIC COMPLETE CYSTECT ILEAL CONDUIT     SHOULDER ARTHROSCOPY  2010   left   TONSILLECTOMY AND ADENOIDECTOMY  1957   TOTAL KNEE ARTHROPLASTY Right 09/08/2021   Procedure: TOTAL KNEE ARTHROPLASTY;  Surgeon: Susa Day, MD;  Location: [facility];  Service: Orthopedics;  Laterality: Right;   TRANSURETHRAL RESECTION OF PROSTATE     x 2   UPPER GASTROINTESTINAL ENDOSCOPY  05/13/2008   hiatal hernia   VASECTOMY     Family History  Problem  Relation Age of Onset   Allergic rhinitis Mother    Heart disease Mother    Hypertension Mother    Stroke Mother    Arthritis Mother    Colon polyps Father    Diabetes Father    Heart disease Father    Hypertension Father    Diabetes Sister    Breast cancer Sister    Diabetes Paternal Uncle    Liver cancer Paternal Uncle    Liver cancer Maternal Grandfather    Colon cancer Neg Hx    Esophageal cancer Neg Hx    Rectal cancer Neg Hx    Stomach cancer Neg Hx    Pancreatic cancer Neg Hx    Social History   Socioeconomic History   Marital status: Married    Spouse name: Not on file   Number of children: 4   Years of education: Not on file   Highest education level: Not on file  Occupational History   Occupation: retired  Tobacco Use   Smoking status: Former    Passive exposure: Past   Smokeless tobacco: Never   Tobacco comments:    quit 1980  Vaping Use   Vaping Use: Never used  Substance and Sexual Activity   Alcohol use: No    Alcohol/week: 0.0 standard drinks of  alcohol   Drug use: No   Sexual activity: Yes    Partners: Female  Other Topics Concern   Not on file  Social History Narrative   Married, retired for children   He is a former smoker no alcohol or substance use   Social Determinants of Radio broadcast assistant Strain: Low Risk  (02/05/2023)   Overall Financial Resource Strain (CARDIA)    Difficulty of Paying Living Expenses: Not hard at all  Food Insecurity: No Food Insecurity (02/05/2023)   Hunger Vital Sign    Worried About Running Out of Food in the Last Year: Never true    Ran Out of Food in the Last Year: Never true  Transportation Needs: No Transportation Needs (02/05/2023)   PRAPARE - Hydrologist (Medical): No    Lack of Transportation (Non-Medical): No  Physical Activity: Inactive (02/05/2023)   Exercise Vital Sign    Days of Exercise per Week: 0 days    Minutes of Exercise per Session: 0 min  Stress: Stress Concern Present (02/05/2023)   [place]    Feeling of Stress : Rather much  Social Connections: Moderately Integrated (02/05/2023)   Social Connection and Isolation Panel [NHANES]    Frequency of Communication with Friends and Family: Three times a week    Frequency of Social Gatherings with Friends and Family: Three times a week    Attends Religious Services: 1 to 4 times per year    Active Member of Clubs or Organizations: No    Attends Archivist Meetings: Never    Marital Status: Married    Tobacco Counseling Counseling given: Not Answered Tobacco comments: quit 1980   Clinical Intake:  Pre-visit preparation completed: Yes  Pain : No/denies pain  Diabetes: No  How often do you need to have someone help you when you read instructions, pamphlets, or other written materials from your doctor or pharmacy?: 1 - Never  Diabetic?No   Interpreter Needed?: No  Information entered by :: Denman George LPN   Activities of Daily Living    02/05/2023    9:39 PM  In your present state of health, do you have any difficulty performing the following activities:  Hearing? 0  Vision? 0  Difficulty concentrating or making decisions? 1  Walking or climbing stairs? 1  Dressing or bathing? 0  Doing errands, shopping? 0  Preparing Food and eating ? N  Using the Toilet? N  In the past six months, have you accidently leaked urine? N  Do you have problems with loss of bowel control? N  Managing your Medications? N  Managing your Finances? N  Housekeeping or managing your Housekeeping? Y    Patient Care Team: [name], MD as PCP - General (Internal Medicine) O'Neal, Cassie Freer, MD as PCP - Cardiology (Cardiology) [name], MD as Attending Physician (Internal Medicine) Domingo Pulse, MD (Urology) Phylliss Blakes, OD as Consulting Physician (Optometry)  Indicate any recent Medical Services you may have received from other than [facility] providers in the past year (date may be approximate).     Assessment:   This is a routine wellness examination for [name].  Hearing/Vision screen Hearing Screening - Comments:: Denies hearing difficulties  Vision Screening - Comments:: Wears rx glasses - up to date with routine eye exams with Samara Snide    Dietary issues and exercise activities discussed: Current Exercise Habits: The patient does not participate in regular exercise at present   Goals Addressed               This Visit's Progress     COMPLETED: Exercise 150 minutes per week (moderate activity)        In general to be more active; Getting his sleep now and having energy;  Has been in aquatics in the past and may try again.  Start date Sept 1       COMPLETED: Patient is interested in support with program (pt-stated)        Interventions:  Called client phone number 2 times today but LCSW was not able to speak via phone with client. LCSW left phone message for client  asking him to call LCSW at [phone].       Depression Screen    02/05/2023    9:37 PM 11/28/2022    4:02 PM 11/28/2022    3:48 PM 10/26/2022    1:24 PM 09/11/2022    9:38 AM 08/28/2022   10:30 AM 07/26/2022    1:46 PM  PHQ 2/9 Scores  PHQ - 2 Score 2 2 1  0 2 2 4   PHQ- 9 Score 6   0 9  16    Fall Risk    02/05/2023    9:34 PM 11/28/2022    4:02 PM 11/28/2022    3:48 PM 10/26/2022    1:24 PM 07/26/2022    1:47 PM  Fall Risk   Falls in the past year? 0 0 0 0 1  Number falls in past yr: 0 0 0  0  Injury with Fall? 0 0 0 0 1  Risk for fall due to : No Fall Risks   No Fall Risks Other (Comment)  Follow up Falls prevention discussed;Education provided;Falls evaluation completed   Falls evaluation completed Falls evaluation completed    FALL RISK PREVENTION PERTAINING TO THE HOME:  Any stairs in or around the home? No  If so, are there any without handrails? No  Home free of loose throw rugs in walkways, pet beds, electrical cords, etc? Yes  Adequate lighting in your home to reduce risk of falls? Yes  ASSISTIVE DEVICES UTILIZED TO PREVENT FALLS:  Life alert? No  Use of a cane, walker or w/c? No  Grab bars in the bathroom? Yes  Shower chair or bench in shower? No  Elevated toilet seat or a handicapped toilet? Yes   TIMED UP AND GO:  Was the test performed? No . Telephonic visit  Cognitive Function:        02/05/2023    9:39 PM  6CIT Screen  What Year? 0 points  What month? 0 points  What time? 0 points  Count back from 20 0 points  Months in reverse 2 points  Repeat phrase 4 points  Total Score 6 points    Immunizations Immunization History  Administered Date(s) Administered   Moderna Sars-Covid-2 Vaccination 01/11/2020, 02/08/2020   Pneumococcal Polysaccharide-23 07/03/2013   Pneumococcal-Unspecified 11/12/2012   Tdap 08/23/2014    TDAP status: Up to date  Flu Vaccine status: Declined, Education has been provided regarding the importance of this  vaccine but patient still declined. Advised may receive this vaccine at local pharmacy or Health Dept. Aware to provide a copy of the vaccination record if obtained from local pharmacy or Health Dept. Verbalized acceptance and understanding.  Pneumococcal vaccine status: Up to date  Covid-19 vaccine status: Information provided on how to obtain vaccines.   Qualifies for Shingles Vaccine? Yes   Zostavax completed No   Shingrix Completed?: No.    Education has been provided regarding the importance of this vaccine. Patient has been advised to call insurance company to determine out of pocket expense if they have not yet received this vaccine. Advised may also receive vaccine at local pharmacy or Health Dept. Verbalized acceptance and understanding.  Screening Tests Health Maintenance  Topic Date Due   Zoster Vaccines- Shingrix (1 of 2) Never done   INFLUENZA VACCINE  02/10/2023 (Originally 06/12/2022)   Medicare Annual Wellness (AWV)  02/05/2024   DTaP/Tdap/Td (2 - Td or Tdap) 08/23/2024   COLONOSCOPY (Pts 45-61yrs Insurance coverage will need to be confirmed)  02/21/2025   Hepatitis C Screening  Completed   HPV VACCINES  Aged Out   Pneumonia Vaccine 3+ Years old  Discontinued   COVID-19 Vaccine  Discontinued    Health Maintenance  Health Maintenance Due  Topic Date Due   Zoster Vaccines- Shingrix (1 of 2) Never done    Colorectal cancer screening: No longer required.   Lung Cancer Screening: (Low Dose CT Chest recommended if Age 36-80 years, 30 pack-year currently smoking OR have quit w/in 15years.) does not qualify.   Lung Cancer Screening Referral: n/a  Additional Screening:  Hepatitis C Screening: does qualify; Completed 07/26/16  Vision Screening: Recommended annual ophthalmology exams for early detection of glaucoma and other disorders of the eye. Is the patient up to date with their annual eye exam?  Yes  Who is the provider or what is the name of the office in which the  patient attends annual eye exams? Dr. Samara Snide If pt is not established with a provider, would they like to be referred to a provider to establish care? No .   Dental Screening: Recommended annual dental exams for proper oral hygiene  Community Resource Referral / Chronic Care Management: CRR required this visit?  Yes   CCM required this visit?  No      Plan:     I have personally reviewed and noted the following in the patient's chart:   Medical and social history Use of alcohol, tobacco or illicit drugs  Current medications and supplements including opioid prescriptions. Patient is not currently taking opioid prescriptions. Functional ability and status Nutritional status Physical activity Advanced directives List of other physicians Hospitalizations, surgeries, and ER visits in previous 12 months Vitals Screenings to include cognitive, depression, and falls Referrals and appointments  In addition, I have reviewed and discussed with patient certain preventive protocols, quality metrics, and best practice recommendations. A written personalized care plan for preventive services as well as general preventive health recommendations were provided to patient.     Vanetta Mulders, [place]   [id number]   Due to this being a virtual visit, the after visit summary with patients personalized plan was offered to patient via mail or my-chart. Patient would like to access on my-chart  Nurse Notes: Patient with concerns about health but unable to properly verbalize concerns; community resource referral initiated.

## 2023-02-05 NOTE — Patient Instructions (Signed)
[name] , Thank you for taking time to come for your Medicare Wellness Visit. I appreciate your ongoing commitment to your health goals. Please review the following plan we discussed and let me know if I can assist you in the future.   These are the goals we discussed:  Goals      Increase my physical activity     Continue to stretch and walk as much as possible.        This is a list of the screening recommended for you and due dates:  Health Maintenance  Topic Date Due   Zoster (Shingles) Vaccine (1 of 2) Never done   Flu Shot  02/10/2023*   Medicare Annual Wellness Visit  02/05/2024   DTaP/Tdap/Td vaccine (2 - Td or Tdap) 08/23/2024   Colon Cancer Screening  02/21/2025   Hepatitis C Screening: USPSTF Recommendation to screen - Ages 18-79 yo.  Completed   HPV Vaccine  Aged Out   Pneumonia Vaccine  Discontinued   COVID-19 Vaccine  Discontinued  *Topic was postponed. The date shown is not the original due date.    Advanced directives: We have a copy of your advanced directives available in your record should your provider ever need to access them.   Conditions/risks identified: Aim for 30 minutes of exercise or brisk walking, 6-8 glasses of water, and 5 servings of fruits and vegetables each day.   Next appointment: Follow up in one year for your annual wellness visit.   Preventive Care 29 Years and Older, Male  Preventive care refers to lifestyle choices and visits with your health care provider that can promote health and wellness. What does preventive care include? A yearly physical exam. This is also called an annual well check. Dental exams once or twice a year. Routine eye exams. Ask your health care provider how often you should have your eyes checked. Personal lifestyle choices, including: Daily care of your teeth and gums. Regular physical activity. Eating a healthy diet. Avoiding tobacco and drug use. Limiting alcohol use. Practicing safe sex. Taking low  doses of aspirin every day. Taking vitamin and mineral supplements as recommended by your health care provider. What happens during an annual well check? The services and screenings done by your health care provider during your annual well check will depend on your age, overall health, lifestyle risk factors, and family history of disease. Counseling  Your health care provider may ask you questions about your: Alcohol use. Tobacco use. Drug use. Emotional well-being. Home and relationship well-being. Sexual activity. Eating habits. History of falls. Memory and ability to understand (cognition). Work and work Statistician. Screening  You may have the following tests or measurements: Height, weight, and BMI. Blood pressure. Lipid and cholesterol levels. These may be checked every 5 years, or more frequently if you are over 63 years old. Skin check. Lung cancer screening. You may have this screening every year starting at age 13 if you have a 30-pack-year history of smoking and currently smoke or have quit within the past 15 years. Fecal occult blood test (FOBT) of the stool. You may have this test every year starting at age 89. Flexible sigmoidoscopy or colonoscopy. You may have a sigmoidoscopy every 5 years or a colonoscopy every 10 years starting at age 31. Prostate cancer screening. Recommendations will vary depending on your family history and other risks. Hepatitis C blood test. Hepatitis B blood test. Sexually transmitted disease (STD) testing. Diabetes screening. This is done by checking your blood  sugar (glucose) after you have not eaten for a while (fasting). You may have this done every 1-3 years. Abdominal aortic aneurysm (AAA) screening. You may need this if you are a current or former smoker. Osteoporosis. You may be screened starting at age 52 if you are at high risk. Talk with your health care provider about your test results, treatment options, and if necessary, the need  for more tests. Vaccines  Your health care provider may recommend certain vaccines, such as: Influenza vaccine. This is recommended every year. Tetanus, diphtheria, and acellular pertussis (Tdap, Td) vaccine. You may need a Td booster every 10 years. Zoster vaccine. You may need this after age 5. Pneumococcal 13-valent conjugate (PCV13) vaccine. One dose is recommended after age 70. Pneumococcal polysaccharide (PPSV23) vaccine. One dose is recommended after age 44. Talk to your health care provider about which screenings and vaccines you need and how often you need them. This information is not intended to replace advice given to you by your health care provider. Make sure you discuss any questions you have with your health care provider. Document Released: 11/25/2015 Document Revised: 07/18/2016 Document Reviewed: 08/30/2015 Elsevier Interactive Patient Education  2017 [place] Prevention in the Home Falls can cause injuries. They can happen to people of all ages. There are many things you can do to make your home safe and to help prevent falls. What can I do on the outside of my home? Regularly fix the edges of walkways and driveways and fix any cracks. Remove anything that might make you trip as you walk through a door, such as a raised step or threshold. Trim any bushes or trees on the path to your home. Use bright outdoor lighting. Clear any walking paths of anything that might make someone trip, such as rocks or tools. Regularly check to see if handrails are loose or broken. Make sure that both sides of any steps have handrails. Any raised decks and porches should have guardrails on the edges. Have any leaves, snow, or ice cleared regularly. Use sand or salt on walking paths during winter. Clean up any spills in your garage right away. This includes oil or grease spills. What can I do in the bathroom? Use night lights. Install grab bars by the toilet and in the tub and  shower. Do not use towel bars as grab bars. Use non-skid mats or decals in the tub or shower. If you need to sit down in the shower, use a plastic, non-slip stool. Keep the floor dry. Clean up any water that spills on the floor as soon as it happens. Remove soap buildup in the tub or shower regularly. Attach bath mats securely with double-sided non-slip rug tape. Do not have throw rugs and other things on the floor that can make you trip. What can I do in the bedroom? Use night lights. Make sure that you have a light by your bed that is easy to reach. Do not use any sheets or blankets that are too big for your bed. They should not hang down onto the floor. Have a firm chair that has side arms. You can use this for support while you get dressed. Do not have throw rugs and other things on the floor that can make you trip. What can I do in the kitchen? Clean up any spills right away. Avoid walking on wet floors. Keep items that you use a lot in easy-to-reach places. If you need to reach something above you,  use a strong step stool that has a grab bar. Keep electrical cords out of the way. Do not use floor polish or wax that makes floors slippery. If you must use wax, use non-skid floor wax. Do not have throw rugs and other things on the floor that can make you trip. What can I do with my stairs? Do not leave any items on the stairs. Make sure that there are handrails on both sides of the stairs and use them. Fix handrails that are broken or loose. Make sure that handrails are as long as the stairways. Check any carpeting to make sure that it is firmly attached to the stairs. Fix any carpet that is loose or worn. Avoid having throw rugs at the top or bottom of the stairs. If you do have throw rugs, attach them to the floor with carpet tape. Make sure that you have a light switch at the top of the stairs and the bottom of the stairs. If you do not have them, ask someone to add them for  you. What else can I do to help prevent falls? Wear shoes that: Do not have high heels. Have rubber bottoms. Are comfortable and fit you well. Are closed at the toe. Do not wear sandals. If you use a stepladder: Make sure that it is fully opened. Do not climb a closed stepladder. Make sure that both sides of the stepladder are locked into place. Ask someone to hold it for you, if possible. Clearly mark and make sure that you can see: Any grab bars or handrails. First and last steps. Where the edge of each step is. Use tools that help you move around (mobility aids) if they are needed. These include: Canes. Walkers. Scooters. Crutches. Turn on the lights when you go into a dark area. Replace any light bulbs as soon as they burn out. Set up your furniture so you have a clear path. Avoid moving your furniture around. If any of your floors are uneven, fix them. If there are any pets around you, be aware of where they are. Review your medicines with your doctor. Some medicines can make you feel dizzy. This can increase your chance of falling. Ask your doctor what other things that you can do to help prevent falls. This information is not intended to replace advice given to you by your health care provider. Make sure you discuss any questions you have with your health care provider. Document Released: 08/25/2009 Document Revised: 04/05/2016 Document Reviewed: 12/03/2014 Elsevier Interactive Patient Education  2017 [employer].

## 2023-02-06 ENCOUNTER — Telehealth: Payer: Self-pay | Admitting: *Deleted

## 2023-02-06 NOTE — Progress Notes (Signed)
  Care Coordination   Note   02/06/2023 Name: Erik NAFFZIGER Sr. MRN: [id number] DOB: February 02, 1946  Erik Lolling Sr. is a 77 y.o. year old male who sees [name], MD for primary care. I reached out to [place] by phone today to offer care coordination services.  [name] was given information about Care Coordination services today including:   The Care Coordination services include support from the care team which includes your Nurse Coordinator, Clinical Social Worker, or Pharmacist.  The Care Coordination team is here to help remove barriers to the health concerns and goals most important to you. Care Coordination services are voluntary, and the patient may decline or stop services at any time by request to their care team member.   Care Coordination Consent Status: Patient agreed to services and verbal consent obtained.   Follow up plan:  Telephone appointment with care coordination team member scheduled for:  02/15/2023  Encounter Outcome:  Pt. Scheduled from referral   Julian Hy, [place] Direct Dial: [phone]

## 2023-02-06 NOTE — Progress Notes (Signed)
Cardiology Office Note:   Date:  02/08/2023  NAME:  Erik INTERRANTE Sr.    MRN: [id number] DOB:  1945-12-29   PCP:  [name], MD  Cardiologist:  Evalina Field, MD  Electrophysiologist:  None   Referring MD: [name], MD   Chief Complaint  Patient presents with   Follow-up        History of Present Illness:   Erik Lolling Sr. is a 77 y.o. male with a hx of CAD, GI bleed, SVT who presents for follow-up.  He reports his arm pain has improved.  No significant discomfort today.  Not exercising.  We discussed this.  Intolerant of statins.  Trying to determine if he would do a PCSK9 inhibitor.  Had issues with GI bleeding.  No longer on aspirin.  Overall seems to be stable.  Blood pressure is stable.  Had brief SVT but now monitoring with Kardia mobile.  No recurrence.  Overall doing well.  We discussed regular exercise and proper diet.  Problem List CAD -70-99% pLAD; CT FFR 0.57 -PCI pLAD 05/28/2022 2. HTN 3. HLD -Tchol 238, HDL 47, LDL 153, TG 185 -Statin intolerant 4.  Gastritis/recurrent GI bleed -Massive GI bleed [facility] in 2021 -EGD 01/2022 with gastritis -GI bleed 08/2022 -> [facility] (no source identified) 5. SVT -brief episodes   Past Medical History: Past Medical History:  Diagnosis Date   Allergic rhinitis    Allergy    Anal fissure    Anemia    Anxiety    Blood transfusion without reported diagnosis 12/06/2022   also 2 in the past   Cataract    starting   Coronary atherosclerosis of native coronary artery 2007   nonobstructive CAD by cath with 40% mid-LAD stenosis   Degenerative arthritis of knee, bilateral 10/09/2017   Depression    Diverticulosis 12/18/2011   Diverticulosis of colon with hemorrhage    April 2021, [facility]   Duodenal mass    Fibromyalgia    GERD with stricture    Helicobacter pylori (H. 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   Vitamin D deficiency 11/29/2017    Past Surgical History: Past Surgical History:  Procedure Laterality Date   ADENOIDECTOMY     bladder distention     x 6   CARDIAC CATHETERIZATION     CATHETER REMOVAL     super pubic area   COLONOSCOPY     CORONARY STENT INTERVENTION N/A 05/29/2022   Procedure: CORONARY STENT INTERVENTION;  Surgeon: Martinique, Peter M, MD;  Location: [facility];  Service: Cardiovascular;  Laterality: N/A;   DENTAL SURGERY Right    #30 tooth extraction (right upper)   ESOPHAGOGASTRODUODENOSCOPY  12/17/2022   INTERSTIM IMPLANT PLACEMENT     INTERSTIM IMPLANT REMOVAL     KNEE ARTHROSCOPY     right x 2   LEFT  HEART CATH AND CORONARY ANGIOGRAPHY N/A 05/29/2022   Procedure: LEFT HEART CATH AND CORONARY ANGIOGRAPHY;  Surgeon: Martinique, Peter M, MD;  Location: [facility];  Service: Cardiovascular;  Laterality: N/A;   NISSEN FUNDOPLICATION  [id number]   PROSTECTOMY     ROBOT ASSISTED LAPAROSCOPIC COMPLETE CYSTECT ILEAL CONDUIT     SHOULDER ARTHROSCOPY  2010   left   TONSILLECTOMY AND ADENOIDECTOMY  1957   TOTAL KNEE ARTHROPLASTY Right 09/08/2021   Procedure: TOTAL KNEE ARTHROPLASTY;  Surgeon: Susa Day, MD;  Location: [facility];  Service: Orthopedics;  Laterality: Right;   TRANSURETHRAL RESECTION OF PROSTATE     x 2   UPPER GASTROINTESTINAL ENDOSCOPY  05/13/2008   hiatal hernia   VASECTOMY      Current Medications: Current Meds  Medication Sig   LORazepam (ATIVAN) 0.5 MG tablet Take 0.5 tablets (0.25 mg total) by mouth 2  (two) times daily as needed for anxiety.   omega-3 acid ethyl esters (LOVAZA) 1 g capsule Take 1,250 mg by mouth daily.     Allergies:    Ambien [zolpidem], Bentyl [dicyclomine], Carafate [sucralfate], Lansoprazole, Lexapro [escitalopram oxalate], Other, Pneumovax [pneumococcal polysaccharide vaccine], Prilosec [omeprazole], Remeron [mirtazapine], Seroquel [quetiapine], Statins, Tizanidine, Tramadol, and Tylenol [acetaminophen]   Social History: Social History   Socioeconomic History   Marital status: Married    Spouse name: Not on file   Number of children: 4   Years of education: Not on file   Highest education level: Not on file  Occupational History   Occupation: retired  Tobacco Use   Smoking status: Former    Passive exposure: Past   Smokeless tobacco: Never   Tobacco comments:    quit 1980  Vaping Use   Vaping Use: Never used  Substance and Sexual Activity   Alcohol use: No    Alcohol/week: 0.0 standard drinks of alcohol   Drug use: No   Sexual activity: Yes    Partners: Female  Other Topics Concern   Not on file  Social History Narrative   Married, retired for children   He is a former smoker no alcohol or substance use   Social Determinants of Radio broadcast assistant Strain: Low Risk  (02/05/2023)   Overall Financial Resource Strain (CARDIA)    Difficulty of Paying Living Expenses: Not hard at all  Food Insecurity: No Food Insecurity (02/05/2023)   Hunger Vital Sign    Worried About Running Out of Food in the Last Year: Never true    Ran Out of Food in the Last Year: Never true  Transportation Needs: No Transportation Needs (02/05/2023)   PRAPARE - Hydrologist (Medical): No    Lack of Transportation (Non-Medical): No  Physical Activity: Inactive (02/05/2023)   Exercise Vital Sign    Days of Exercise per Week: 0 days    Minutes of Exercise per Session: 0 min  Stress: Stress Concern Present (02/05/2023)   [place]    Feeling of Stress : Rather much  Social Connections: Moderately Integrated (02/05/2023)   Social Connection and Isolation Panel [NHANES]    Frequency of Communication with Friends and Family: Three times a week    Frequency of Social Gatherings with Friends and Family: Three times a week    Attends Religious Services: 1 to 4 times per year    Active Member of Clubs or Organizations: No    Attends Archivist Meetings: Never  Marital Status: Married     Family History: The patient's family history includes Allergic rhinitis in his mother; Arthritis in his mother; Breast cancer in his sister; Colon polyps in his father; Diabetes in his father, paternal uncle, and sister; Heart disease in his father and mother; Hypertension in his father and mother; Liver cancer in his maternal grandfather and paternal uncle; Stroke in his mother. There is no history of Colon cancer, Esophageal cancer, Rectal cancer, Stomach cancer, or Pancreatic cancer.  ROS:   All other ROS reviewed and negative. Pertinent positives noted in the HPI.     EKGs/Labs/Other Studies Reviewed:   The following studies were personally reviewed by me today:  Recent Labs: 11/28/2022: Magnesium 2.0 01/23/2023: ALT 12; BUN 20; Creatinine, Ser 1.41; Hemoglobin 15.4; Platelets 356.0; Potassium 4.8; Sodium 135; TSH 2.76   Recent Lipid Panel    Component Value Date/Time   CHOL 170 11/28/2022 1612   TRIG 167.0 (H) 11/28/2022 1612   HDL 42.00 11/28/2022 1612   CHOLHDL 4 11/28/2022 1612   VLDL 33.4 11/28/2022 1612   LDLCALC 94 11/28/2022 1612   LDLDIRECT 169.0 06/24/2018 1408    Physical Exam:   VS:  BP 104/67 (BP Location: Left Arm, Patient Position: Sitting, Cuff Size: Normal)   Pulse 80   Ht 5\' 7"  (1.702 m)   Wt 168 lb (76.2 kg)   BMI 26.31 kg/m    Wt Readings from Last 3 Encounters:  02/08/23 168 lb (76.2 kg)  02/05/23 166 lb (75.3 kg)  01/24/23 166 lb  (75.3 kg)    General: Well nourished, well developed, in no acute distress Head: Atraumatic, normal size  Eyes: PEERLA, EOMI  Neck: Supple, no JVD Endocrine: No thryomegaly Cardiac: Normal S1, S2; RRR; no murmurs, rubs, or gallops Lungs: Clear to auscultation bilaterally, no wheezing, rhonchi or rales  Abd: Soft, nontender, no hepatomegaly  Ext: No edema, pulses 2+ Musculoskeletal: No deformities, BUE and BLE strength normal and equal Skin: Warm and dry, no rashes   Neuro: Alert and oriented to person, place, time, and situation, CNII-XII grossly intact, no focal deficits  Psych: Normal mood and affect   ASSESSMENT:   Erik Lolling Sr. is a 77 y.o. male who presents for the following: 1. Coronary artery disease of native artery of native heart with stable angina pectoris ([place])   2. Pure hypercholesterolemia   3. SVT (supraventricular tachycardia)   4. Palpitations     PLAN:   1. Coronary artery disease of native artery of native heart with stable angina pectoris ([place]) 2. Pure hypercholesterolemia -Status post PCI to the LAD.  Has completed 6 months of DAPT.  Has issues with GI bleeding.  Recommended to hold aspirin.  Lipids not at goal.  Intolerant of statins.  Intolerant of Zetia.  He is waiting to determine if he will do a PCSK9 inhibitor.  He has met with pharmacy.  He will let us know.  We discussed preventive approach.  We also discussed regular exercise and proper diet.  He will work on all of this.  Overall seems to be stable.  We discussed return precautions.  He never really had chest discomfort.  He had right arm pain.  Symptoms are difficult to delineate.  We discussed pain unresponsive to nitroglycerin after 3 doses of 15 minutes of discomfort would mandate evaluation in the emergency room.  Given his history he may just want to call the on-call physician.  He will keep an eye on things.  3.  SVT (supraventricular tachycardia) 4. Palpitations -Brief SVT.  Monitor shows  normal rhythm.  He is monitoring his heart rhythm at home.  No arrhythmia recurrence per cardia mobile.      Disposition: Return in about 6 months (around 08/11/2023).  Medication Adjustments/Labs and Tests Ordered: Current medicines are reviewed at length with the patient today.  Concerns regarding medicines are outlined above.  No orders of the defined types were placed in this encounter.  No orders of the defined types were placed in this encounter.   Patient Instructions  Medication Instructions:  Your physician recommends that you continue on your current medications as directed. Please refer to the Current Medication list given to you today.  *If you need a refill on your cardiac medications before your next appointment, please call your pharmacy*   Follow-Up: At [facility], you and your health needs are our priority.  As part of our continuing mission to provide you with exceptional heart care, we have created designated Provider Care Teams.  These Care Teams include your primary Cardiologist (physician) and Advanced Practice Providers (APPs -  Physician Assistants and Nurse Practitioners) who all work together to provide you with the care you need, when you need it.  We recommend signing up for the patient portal called "MyChart".  Sign up information is provided on this After Visit Summary.  MyChart is used to connect with patients for Virtual Visits (Telemedicine).  Patients are able to view lab/test results, encounter notes, upcoming appointments, etc.  Non-urgent messages can be sent to your provider as well.   To learn more about what you can do with MyChart, go to [email or web address].    Your next appointment:   6 months with NL APP   &   1 year with Dr. Audie Box     Time Spent with Patient: I have spent a total of 25 minutes with patient reviewing hospital notes, telemetry, EKGs, labs and examining the patient as well as establishing an assessment and  plan that was discussed with the patient.  > 50% of time was spent in direct patient care.  Signed, Addison Naegeli. Audie Box, MD, [place]  [address], [place], [place] [address] [phone]  02/08/2023 11:21 AM

## 2023-02-08 ENCOUNTER — Ambulatory Visit (HOSPITAL_BASED_OUTPATIENT_CLINIC_OR_DEPARTMENT_OTHER): Payer: Medicare Other | Admitting: Cardiovascular Disease

## 2023-02-08 ENCOUNTER — Encounter (HOSPITAL_BASED_OUTPATIENT_CLINIC_OR_DEPARTMENT_OTHER): Payer: Self-pay | Admitting: Cardiovascular Disease

## 2023-02-08 VITALS — BP 104/67 | HR 80 | Ht 67.0 in | Wt 168.0 lb

## 2023-02-08 DIAGNOSIS — I471 Supraventricular tachycardia, unspecified: Secondary | ICD-10-CM | POA: Diagnosis not present

## 2023-02-08 DIAGNOSIS — I25118 Atherosclerotic heart disease of native coronary artery with other forms of angina pectoris: Secondary | ICD-10-CM | POA: Diagnosis not present

## 2023-02-08 DIAGNOSIS — R002 Palpitations: Secondary | ICD-10-CM | POA: Diagnosis not present

## 2023-02-08 DIAGNOSIS — E7800 Pure hypercholesterolemia, unspecified: Secondary | ICD-10-CM | POA: Diagnosis not present

## 2023-02-08 NOTE — Patient Instructions (Signed)
Medication Instructions:  Your physician recommends that you continue on your current medications as directed. Please refer to the Current Medication list given to you today.  *If you need a refill on your cardiac medications before your next appointment, please call your pharmacy*   Follow-Up: At [facility], you and your health needs are our priority.  As part of our continuing mission to provide you with exceptional heart care, we have created designated Provider Care Teams.  These Care Teams include your primary Cardiologist (physician) and Advanced Practice Providers (APPs -  Physician Assistants and Nurse Practitioners) who all work together to provide you with the care you need, when you need it.  We recommend signing up for the patient portal called "MyChart".  Sign up information is provided on this After Visit Summary.  MyChart is used to connect with patients for Virtual Visits (Telemedicine).  Patients are able to view lab/test results, encounter notes, upcoming appointments, etc.  Non-urgent messages can be sent to your provider as well.   To learn more about what you can do with MyChart, go to [email or web address].    Your next appointment:   6 months with NL APP   &   1 year with Dr. Audie Box

## 2023-02-12 ENCOUNTER — Encounter: Payer: Self-pay | Admitting: Internal Medicine

## 2023-02-15 ENCOUNTER — Ambulatory Visit: Payer: Self-pay | Admitting: Licensed Clinical Social Worker

## 2023-02-15 DIAGNOSIS — F419 Anxiety disorder, unspecified: Secondary | ICD-10-CM

## 2023-02-15 DIAGNOSIS — Z932 Ileostomy status: Secondary | ICD-10-CM | POA: Diagnosis not present

## 2023-02-15 DIAGNOSIS — F32A Depression, unspecified: Secondary | ICD-10-CM

## 2023-02-15 NOTE — Patient Instructions (Signed)
Visit Information  Thank you for taking time to visit with me today. Please don't hesitate to contact me if I can be of assistance to you.   Following are the goals we discussed today:   Goals Addressed             This Visit's Progress    Connect for ongoing therapy       Activities and task to complete in order to accomplish goals.   I have place a referral with [facility] to assist with managing your mental health needs. They will contact you. Please contact them  to follow up if needed [phone].         Our next appointment is by telephone on 02/26/23 at 3:00  Please call the care guide team at [phone] if you need to cancel or reschedule your appointment.   If you are experiencing a Mental Health or Behavioral Health Crisis or need someone to talk to, please call the Suicide and Crisis Lifeline: 988 call the [place] National Suicide Prevention Lifeline: 423-754-5756 or TTY: 719-116-6632 TTY 612-705-1417) to talk to a trained counselor call 1-800-273-TALK (toll free, 24 hour hotline) go to [facility] [address] [place] [phone])   Patient verbalizes understanding of instructions and care plan provided today and agrees to view in MyChart. Active MyChart status and patient understanding of how to access instructions and care plan via MyChart confirmed with patient.     Sammuel Hines, LCSW Social Work Care Coordination  [facility] [employer] [phone]

## 2023-02-15 NOTE — Patient Outreach (Signed)
  Care Coordination  Initial Visit Note   02/15/2023 Name: JAIREN TREMBLY Sr. MRN: [id number] DOB: 1946/08/14  Gaspar Cola Sr. is a 77 y.o. year old male who sees Corwin Levins, MD for primary care. I spoke with  Gaspar Cola Sr. by phone today.  What matters to the patients health and wellness today?  Managing his mental health and reducing symptoms of stress     Goals Addressed             This Visit's Progress    Connect for ongoing therapy       Activities and task to complete in order to accomplish goals.   I have place a referral with [facility] to assist with managing your mental health needs. They will contact you. Please contact them  to follow up if needed [phone].        SDOH assessments and interventions completed: completed by RN during AWV No  Care Coordination Interventions:  Yes, provided  Interventions Today    Flowsheet Row Most Recent Value  Chronic Disease   Chronic disease during today's visit Other  Mental Health Interventions   Mental Health Discussed/Reviewed Mental Health Discussed, Coping Strategies, Anxiety, Depression       Follow up plan:  Referral made to [facility]  Follow up call scheduled for 02/26/23    Encounter Outcome:  Pt. Visit Completed   Sammuel Hines, LCSW Social Work Care Coordination  [facility] [employer] [phone]

## 2023-02-26 ENCOUNTER — Ambulatory Visit: Payer: Self-pay | Admitting: Licensed Clinical Social Worker

## 2023-02-26 NOTE — Patient Outreach (Signed)
  Care Coordination  Follow Up Visit Note   02/26/2023 Name: BOAZ BERISHA Sr. MRN: [id number] DOB: March 07, 1946  Gaspar Cola Sr. is a 77 y.o. year old male who sees Corwin Levins, MD for primary care. I spoke with  Gaspar Cola Sr. by phone today.  What matters to the patients health and wellness today?  Getting his mental health records to Dr. Jonny Ruiz his PCP.  Patient continues to experience barriers with getting his mental health medical records sent to his primary care doctor.   Goals Addressed             This Visit's Progress    Connect for ongoing therapy       Activities and task to complete in order to accomplish goals.   I have place a referral with [facility] to assist with managing your mental health needs. They have contacted you to schedule your therapy appointment [phone].  I have contacted [facility] to assist you with finding answers to your unanswered questions about your mental health records requesting.   [facility] sent a request form for you to sign on 02/03/23  and today 02/26/23 The Medical Records person is unable to move forward with the request until you sign the release form and send it back Until the release is completed nothing can be done       SDOH assessments and interventions completed:  No   Care Coordination Interventions:  Yes, provided  Interventions Today    Flowsheet Row Most Recent Value  General Interventions   General Interventions Discussed/Reviewed Communication with, General Interventions Reviewed  Communication with --  [referral coordinator at [facility]]  Mental Health Interventions   Mental Health Discussed/Reviewed Mental Health Reviewed, Other  [collaboration and removing barriers to care]       Follow up plan:  No f/u scheduled, per patient's request left a voice message, will follow up in 2 to 3 days.  Encounter Outcome:  Pt. Visit Completed   Sammuel Hines, LCSW Social Work Care Coordination  [facility] [employer] [phone]

## 2023-02-26 NOTE — Patient Instructions (Signed)
Visit Information  Thank you for taking time to visit with me today. Please don't hesitate to contact me if I can be of assistance to you.   Following are the goals we discussed today:   Goals Addressed             This Visit's Progress    Connect for ongoing therapy       Activities and task to complete in order to accomplish goals.   I have place a referral with [facility] to assist with managing your mental health needs. They have contacted you to schedule your therapy appointment [phone].  I have contacted [facility] to assist you with finding answers to your unanswered questions about your mental health records requesting.   [facility] sent a request form for you to sign on 02/03/23  and today 02/26/23 The Medical Records person is unable to move forward with the request until you sign the release form and send it back Until the release is completed nothing can be done        Please call the care guide team at [phone] if you need to cancel or reschedule your appointment.   If you are experiencing a Mental Health or Behavioral Health Crisis or need someone to talk to, please call the Suicide and Crisis Lifeline: 988 call the [place] National Suicide Prevention Lifeline: 920-503-3351 or TTY: (571)589-1838 TTY 913-584-5818) to talk to a trained counselor call 1-800-273-TALK (toll free, 24 hour hotline) go to [facility] [address], [place] [phone])   Patient verbalizes understanding of instructions and care plan provided today and agrees to view in MyChart. Active MyChart status and patient understanding of how to access instructions and care plan via MyChart confirmed with patient.     No f/u call is scheduled I will call you in 2 to 3 days  Sammuel Hines, [employer] Social Work Care Coordination  [facility] [employer] [phone]

## 2023-02-27 ENCOUNTER — Ambulatory Visit: Payer: Self-pay | Admitting: Licensed Clinical Social Worker

## 2023-02-27 NOTE — Patient Outreach (Signed)
  Care Coordination  Follow Up Visit Note   02/27/2023 Name: Erik TA Sr. MRN: [id number] DOB: 01-28-1946  Erik Cola Sr. is a 77 y.o. year old male who sees Corwin Levins, MD for primary care. I spoke with  Erik Cola Sr. by phone today.  What matters to the patients health and wellness today?  Getting his mental health records to his PCP.    Goals Addressed             This Visit's Progress    COMPLETED: Connect for ongoing therapy       Activities and task to complete in order to accomplish goals.   I have place a referral with [facility] to assist with managing your mental health needs. They have contacted you to schedule your therapy appointment [phone].  I have contacted [facility] to assist you with finding answers to your unanswered questions  and navigating barriers with your release form for your mental health records to Dr. Jonny Ruiz.   [facility] sent a request form for you to sign on 02/03/23  and today 02/26/23 The Medical Records person is unable to move forward with the request until you sign the release form and send it back You continue to experience barriers with the form that was e-mailed to you and have agreed to go into the office to sign the release form . Please go before 2:00 to the [employer] location and ask for [employer],       SDOH assessments and interventions completed:  No   Care Coordination Interventions:  Yes, provided  Interventions Today    Flowsheet Row Most Recent Value  General Interventions   General Interventions Discussed/Reviewed Communication with  Communication with --  [[facility]]  Mental Health Interventions   Mental Health Discussed/Reviewed Other, Mental Health Reviewed  [Solution focus, task center in removing barriers to care]       Follow up plan: No further intervention required. You do not desire ongoing support.  You will call if additional needs  come up.  Encounter Outcome:  Pt. Visit Completed   Sammuel Hines, LCSW Social Work Care Coordination  [facility] [employer] [phone]

## 2023-02-27 NOTE — Patient Instructions (Signed)
Visit Information  Thank you for taking time to visit with me today. Please don't hesitate to contact me if I can be of assistance to you.   Following are the goals we discussed today:   Goals Addressed             This Visit's Progress    COMPLETED: Connect for ongoing therapy       Activities and task to complete in order to accomplish goals.   I have place a referral with [facility] to assist with managing your mental health needs. They have contacted you to schedule your therapy appointment [phone].  I have contacted [facility] to assist you with finding answers to your unanswered questions  and navigating barriers with your release form for your mental health records to Dr. Jonny Ruiz.   [facility] sent a request form for you to sign on 02/03/23  and today 02/26/23 The Medical Records person is unable to move forward with the request until you sign the release form and send it back You continue to experience barriers with the form that was e-mailed to you and have agreed to go into the office to sign the release form . Please go before 2:00 to the [place] location and ask for Dorisann Frames,        Please call the care guide team at [phone] if you need to cancel or reschedule your appointment.   If you are experiencing a Mental Health or Behavioral Health Crisis or need someone to talk to, please call the Suicide and Crisis Lifeline: 988 call the [place] National Suicide Prevention Lifeline: (330)763-8779 or TTY: 6060288593 TTY (415)700-9516) to talk to a trained counselor call 1-800-273-TALK (toll free, 24 hour hotline) go to [facility] [address], [place] [phone])   Patient verbalizes understanding of instructions and care plan provided today and agrees to view in MyChart. Active MyChart status and patient understanding of how to access instructions and care plan via MyChart confirmed  with patient.     No further follow up required: By Social Work at this time  Sammuel Hines, [employer] Social Work Care Coordination  [facility] [employer] [phone]

## 2023-03-01 ENCOUNTER — Other Ambulatory Visit: Payer: Self-pay | Admitting: Internal Medicine

## 2023-03-08 ENCOUNTER — Ambulatory Visit: Payer: Medicare Other | Admitting: Podiatry

## 2023-03-08 ENCOUNTER — Ambulatory Visit (INDEPENDENT_AMBULATORY_CARE_PROVIDER_SITE_OTHER): Payer: Medicare Other

## 2023-03-08 DIAGNOSIS — I872 Venous insufficiency (chronic) (peripheral): Secondary | ICD-10-CM | POA: Diagnosis not present

## 2023-03-08 DIAGNOSIS — M79671 Pain in right foot: Secondary | ICD-10-CM | POA: Diagnosis not present

## 2023-03-08 DIAGNOSIS — M79672 Pain in left foot: Secondary | ICD-10-CM | POA: Diagnosis not present

## 2023-03-08 DIAGNOSIS — R52 Pain, unspecified: Secondary | ICD-10-CM

## 2023-03-08 DIAGNOSIS — R600 Localized edema: Secondary | ICD-10-CM

## 2023-03-08 NOTE — Progress Notes (Signed)
Subjective:  Patient ID: Erik Cola Sr., male    DOB: 1946-04-27,  MRN: [id number]  Chief Complaint  Patient presents with   Foot Problem    Patient has swelling in feet that occur in the evenings after standing, patient does not wake up to the swelling, no prior treatment, patient has been wearing orthotics in shoes, no compression socks     77 y.o. male presents with concern for swelling in the bilateral foot.  He says that this is worse at the end of the day after he has been is on his feet all day.  He does not have pain associated with the swelling.  He does have orthotics for shoes but he does not wear any compression or use Ace wrap.  Past Medical History:  Diagnosis Date   Allergic rhinitis    Allergy    Anal fissure    Anemia    Anxiety    Blood transfusion without reported diagnosis 12/06/2022   also 2 in the past   Cataract    starting   Coronary atherosclerosis of native coronary artery 2007   nonobstructive CAD by cath with 40% mid-LAD stenosis   Degenerative arthritis of knee, bilateral 10/09/2017   Depression    Diverticulosis 12/18/2011   Diverticulosis of colon with hemorrhage    April 2021, [facility]   Duodenal mass    Fibromyalgia    GERD with stricture    Helicobacter pylori (H. 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   Vitamin D deficiency 11/29/2017    Allergies  Allergen Reactions   Ambien [Zolpidem]     Unknown reaction    Bentyl [Dicyclomine]     Memory loss, anxiety, blurred vision   Carafate [Sucralfate] Nausea And Vomiting   Lansoprazole Diarrhea   Lexapro [Escitalopram Oxalate]     Unknown reaction    Other     Pt states he is sensitive to all oral medications   Pneumovax [Pneumococcal Polysaccharide Vaccine] Other (See Comments)    pain   Prilosec [Omeprazole] Other (See Comments)    Per patient joint pain with PPI's   Remeron [Mirtazapine]     Unknown reaction    Seroquel [Quetiapine] Other (See Comments)    Unknown reaction    Statins Other (See Comments)    Joint pain   Tizanidine Other (See Comments)    Made patient "feel crazy"   Tramadol Other (See Comments)    Pt can take small amounts up to twice daily but continued use causes chest / abdominal pain   Tylenol [Acetaminophen] Nausea And Vomiting    Can only take for 2 takes then he starts to have pain    ROS: Negative except as per HPI above  Objective:  General: AAO x3, NAD  Dermatological: With inspection and palpation of the right and left lower extremities there are no open sores, no preulcerative lesions, no  rash or signs of infection present. Nails are of normal length thickness and coloration.   Vascular:  Dorsalis Pedis artery and Posterior Tibial artery pedal pulses are 2/4 bilateral.  Capillary fill time < 3 sec to all digits.   Neruologic: Grossly intact via light touch bilateral. Protective threshold intact to all sites bilateral.   Musculoskeletal: Bilateral lower extremity edema noted 2+ in the foot however no significant pain on palpation.  Patient is sensory intact.  Palpable pedal pulses  Gait: Unassisted, Nonantalgic.   No images are attached to the encounter.  Radiographs:  Deferred Assessment:   1. Bilateral lower extremity edema   2. Chronic venous insufficiency of lower extremity   3. Pain      Plan:  Patient was evaluated and treated and all questions answered.  # Bilateral lower extremity  edema -Discussed with patient he does have evidence of mild to moderate bilateral lower extremity edema edema especially in the forefoot area. -Suspect this is due to chronic venous insufficiency and venous incompetence with backflow -Recommend treatment with compression stockings recommend light compression 10 to 20 mmHg graded compression -Recommend the patient pick these up at a medical supply store. -Follow-up with primary care as well to further elucidate source of swelling if the edema is not controlled.  He does also have a history of CKD that could be contributing as well.         Corinna Gab, DPM [facility]

## 2023-03-09 IMAGING — DX DG KNEE 1-2V*R*
2 series · 2 of 2 positions shown · non-contrast
Comparison: None

CLINICAL DATA: Status post total knee replacement using cement.

EXAM:
RIGHT KNEE - 1-2 VIEW

[knee ap]
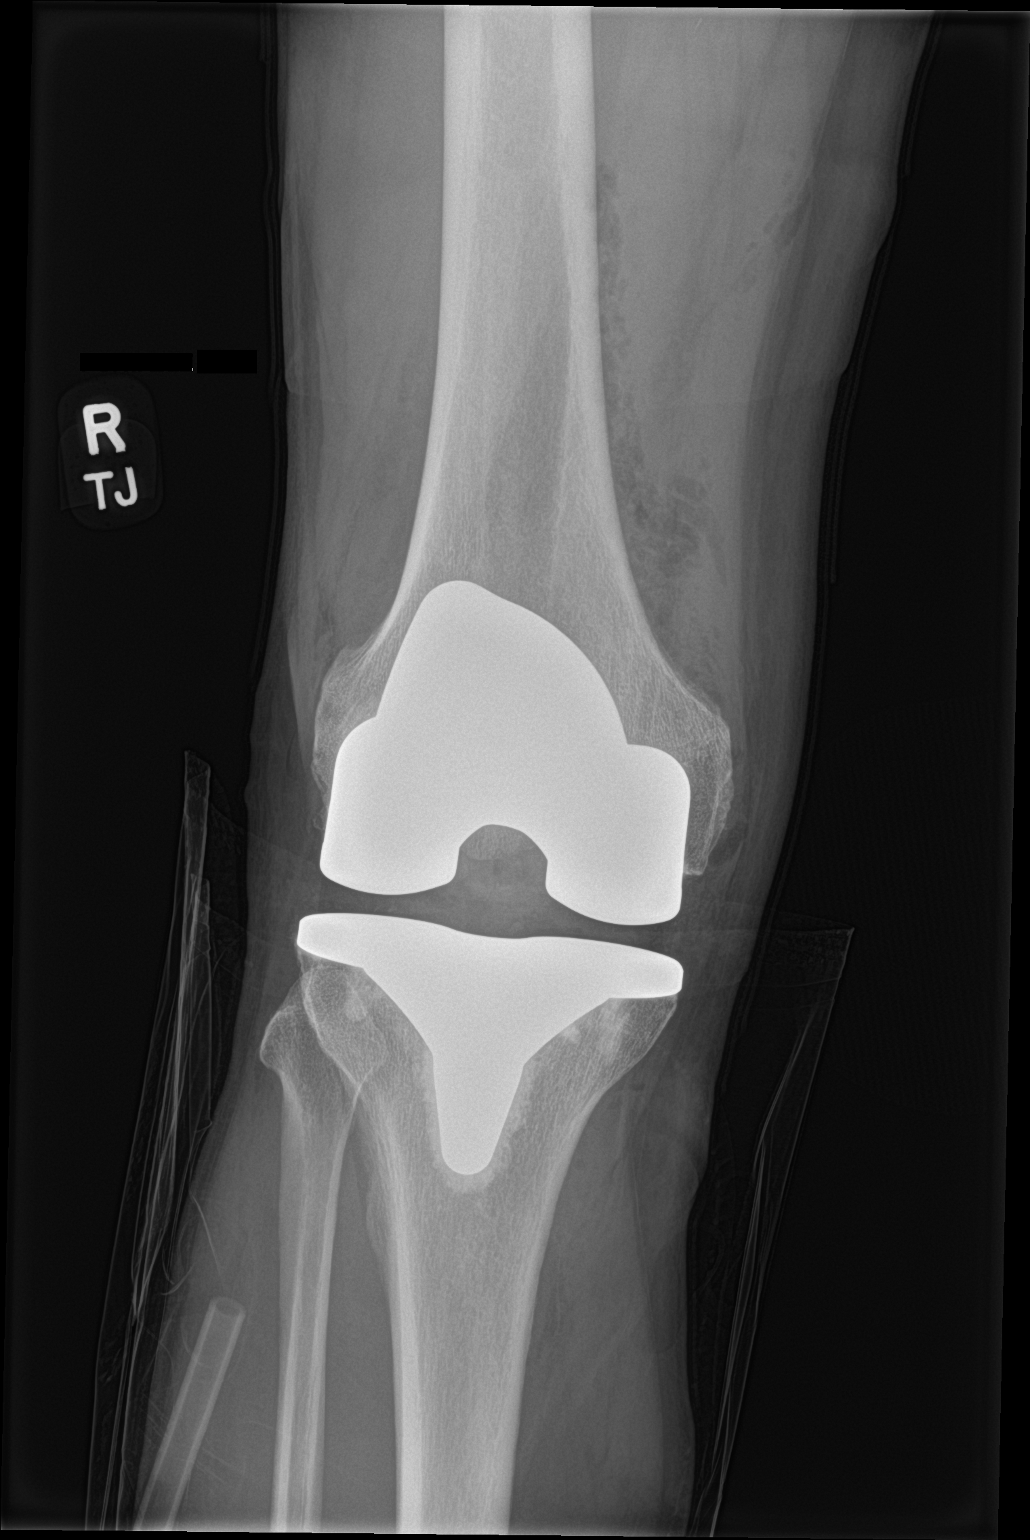

[knee lat]
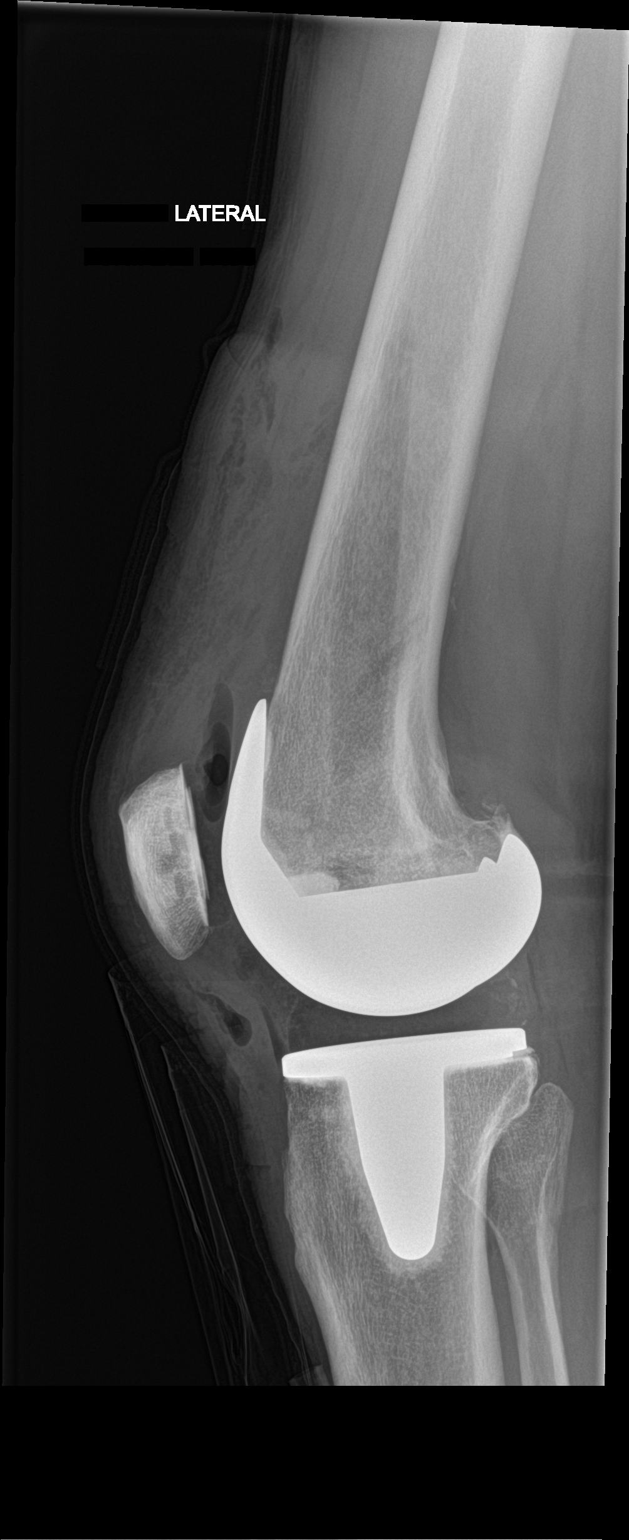

[2 of 2 positions shown; findings below may reference images not displayed]

FINDINGS: Sequela of prior total knee arthroplasty. The femoral and tibial
components appear well-seated. No evidence of fracture. Adjacent
soft tissue swelling and soft tissue gas, not unexpected in the
immediate postoperative setting.
IMPRESSION: Sequela of right total knee arthroplasty, without unexpected
finding.

## 2023-03-14 DIAGNOSIS — H2513 Age-related nuclear cataract, bilateral: Secondary | ICD-10-CM | POA: Diagnosis not present

## 2023-03-14 DIAGNOSIS — H40003 Preglaucoma, unspecified, bilateral: Secondary | ICD-10-CM | POA: Diagnosis not present

## 2023-03-18 DIAGNOSIS — Z932 Ileostomy status: Secondary | ICD-10-CM | POA: Diagnosis not present

## 2023-03-19 DIAGNOSIS — R5383 Other fatigue: Secondary | ICD-10-CM | POA: Diagnosis not present

## 2023-03-19 DIAGNOSIS — N3010 Interstitial cystitis (chronic) without hematuria: Secondary | ICD-10-CM | POA: Diagnosis not present

## 2023-03-19 DIAGNOSIS — Z862 Personal history of diseases of the blood and blood-forming organs and certain disorders involving the immune mechanism: Secondary | ICD-10-CM | POA: Diagnosis not present

## 2023-03-19 DIAGNOSIS — Z906 Acquired absence of other parts of urinary tract: Secondary | ICD-10-CM | POA: Diagnosis not present

## 2023-03-27 ENCOUNTER — Encounter: Payer: Self-pay | Admitting: Internal Medicine

## 2023-03-29 DIAGNOSIS — Z932 Ileostomy status: Secondary | ICD-10-CM | POA: Diagnosis not present

## 2023-04-22 DIAGNOSIS — Z932 Ileostomy status: Secondary | ICD-10-CM | POA: Diagnosis not present

## 2023-04-24 ENCOUNTER — Other Ambulatory Visit: Payer: Self-pay | Admitting: Cardiovascular Disease

## 2023-05-22 DIAGNOSIS — Z932 Ileostomy status: Secondary | ICD-10-CM | POA: Diagnosis not present

## 2023-05-23 DIAGNOSIS — M5451 Vertebrogenic low back pain: Secondary | ICD-10-CM | POA: Diagnosis not present

## 2023-05-28 ENCOUNTER — Encounter: Payer: Self-pay | Admitting: Internal Medicine

## 2023-05-28 ENCOUNTER — Ambulatory Visit (INDEPENDENT_AMBULATORY_CARE_PROVIDER_SITE_OTHER): Payer: Medicare Other | Admitting: Internal Medicine

## 2023-05-28 VITALS — BP 124/76 | HR 72 | Temp 98.5°F | Ht 67.0 in | Wt 168.0 lb

## 2023-05-28 DIAGNOSIS — L309 Dermatitis, unspecified: Secondary | ICD-10-CM

## 2023-05-28 DIAGNOSIS — E559 Vitamin D deficiency, unspecified: Secondary | ICD-10-CM | POA: Diagnosis not present

## 2023-05-28 DIAGNOSIS — F5101 Primary insomnia: Secondary | ICD-10-CM

## 2023-05-28 DIAGNOSIS — E7800 Pure hypercholesterolemia, unspecified: Secondary | ICD-10-CM | POA: Diagnosis not present

## 2023-05-28 MED ORDER — TRIAMCINOLONE ACETONIDE 0.1 % EX CREA: 1.0000 | TOPICAL_CREAM | Freq: Two times a day (BID) | CUTANEOUS | 0 refills | Status: DC

## 2023-05-28 NOTE — Progress Notes (Signed)
Patient ID: Erik Cola Sr., male   DOB: 1945-12-27, 77 y.o.   MRN: [id number]        Chief Complaint: follow up rash dermatitis, hld, insomnia, low vit d       HPI:  Erik SPEAR Sr. is a 77 y.o. male here with c/o itchy area redness to a small area behind the left pinna for last few weeks, just not getting better, seems to pick at it and just gets worse. Pt denies chest pain, increased sob or doe, wheezing, orthopnea, PND, increased LE swelling, palpitations, dizziness or syncope.   Pt denies polydipsia, polyuria, or new focal neuro s/s.   Has ongoing insomnia no change, not worse but stable recently.         Wt Readings from Last 3 Encounters:  05/28/23 168 lb (76.2 kg)  02/08/23 168 lb (76.2 kg)  02/05/23 166 lb (75.3 kg)   BP Readings from Last 3 Encounters:  05/28/23 124/76  02/08/23 104/67  01/24/23 128/76         Past Medical History:  Diagnosis Date   Allergic rhinitis    Allergy    Anal fissure    Anemia    Anxiety    Blood transfusion without reported diagnosis 12/06/2022   also 2 in the past   Cataract    starting   Coronary atherosclerosis of native coronary artery 2007   nonobstructive CAD by cath with 40% mid-LAD stenosis   Degenerative arthritis of knee, bilateral 10/09/2017   Depression    Diverticulosis 12/18/2011   Diverticulosis of colon with hemorrhage    April 2021, [facility] healthcare   Duodenal mass    Fibromyalgia    GERD with stricture    Helicobacter pylori (H. 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   Vitamin D deficiency 11/29/2017   Past Surgical History:  Procedure Laterality Date   ADENOIDECTOMY     bladder distention     x 6   CARDIAC CATHETERIZATION     CATHETER REMOVAL     super pubic area   COLONOSCOPY     CORONARY STENT INTERVENTION N/A 05/29/2022   Procedure: CORONARY STENT INTERVENTION;  Surgeon: Swaziland, Peter M, MD;  Location: [facility] INV[facility];  Service: Cardiovascular;  Laterality: N/A;   DENTAL SURGERY Right    #30 tooth extraction (right upper)   ESOPHAGOGASTRODUODENOSCOPY  12/17/2022   INTERSTIM IMPLANT PLACEMENT     INTERSTIM IMPLANT REMOVAL     KNEE ARTHROSCOPY     right x 2   LEFT HEART CATH AND CORONARY ANGIOGRAPHY N/A 05/29/2022   Procedure: LEFT HEART CATH AND CORONARY ANGIOGRAPHY;  Surgeon: Swaziland, Peter M, MD;  Location: [facility] INVASIVE CV LAB;  Service: Cardiovascular;  Laterality: N/A;   NISSEN FUNDOPLICATION  2009   PROSTECTOMY     ROBOT ASSISTED LAPAROSCOPIC COMPLETE CYSTECT ILEAL CONDUIT     SHOULDER ARTHROSCOPY  2010   left   TONSILLECTOMY AND ADENOIDECTOMY  1957   TOTAL KNEE ARTHROPLASTY Right 09/08/2021   Procedure: TOTAL KNEE ARTHROPLASTY;  Surgeon: Jene Every, MD;  Location: [facility];  Service: Orthopedics;  Laterality: Right;   TRANSURETHRAL RESECTION OF PROSTATE     x 2   UPPER GASTROINTESTINAL ENDOSCOPY  05/13/2008   hiatal hernia   VASECTOMY      reports that he has quit smoking. He has been exposed to tobacco smoke. He has never used smokeless tobacco. He reports that he does not drink alcohol and does not use drugs. family history includes Allergic rhinitis in his mother; Arthritis in his mother; Breast cancer in his sister; Colon polyps in his father; Diabetes in his father, paternal uncle, and sister; Heart disease in his father and mother; Hypertension in his father and mother; Liver cancer in his maternal grandfather and paternal uncle; Stroke in his  mother. Allergies  Allergen Reactions   Ambien [Zolpidem]     Unknown reaction   Bentyl [Dicyclomine]     Memory loss, anxiety, blurred vision   Carafate [Sucralfate] Nausea And Vomiting   Lansoprazole Diarrhea   Lexapro [Escitalopram Oxalate]     Unknown reaction    Other     Pt states he is sensitive to all oral medications   Pneumovax [Pneumococcal Polysaccharide Vaccine] Other (See Comments)    pain   Prilosec [Omeprazole] Other (See Comments)    Per patient joint pain with PPI's   Remeron [Mirtazapine]     Unknown reaction    Seroquel [Quetiapine] Other (See Comments)    Unknown reaction    Statins Other (See Comments)    Joint pain   Tizanidine Other (See Comments)    Made patient "feel crazy"   Tramadol Other (See Comments)    Pt can take small amounts up to twice daily but continued use causes chest / abdominal pain   Tylenol [Acetaminophen] Nausea And Vomiting    Can only take for 2 takes then he starts to have pain   Current Outpatient Medications on File Prior to Visit  Medication Sig Dispense Refill   LORazepam (ATIVAN) 0.5 MG tablet TAKE 0.5 TABLETS BY MOUTH 2 TIMES DAILY AS NEEDED FOR ANXIETY. 30 tablet 2   omega-3 acid ethyl esters (LOVAZA) 1 g capsule Take 1,250 mg by mouth daily. (Patient not taking: Reported on 05/28/2023)     No current facility-administered medications on file prior to visit.        ROS:  All others reviewed and negative.  Objective        PE:  BP 124/76 (BP Location: Right Arm, Patient Position: Sitting, Cuff Size: Normal)   Pulse 72   Temp 98.5 F (36.9 C) (Oral)   Ht 5\' 7"  (1.702 m)   Wt 168 lb (76.2 kg)   SpO2 96%   BMI 26.31 kg/m                 Constitutional: Pt appears in NAD               HENT: Head: NCAT.                Right Ear: External ear normal.                 Left Ear: External ear normal.                Eyes: . Pupils are equal, round, and reactive to light. Conjunctivae and  EOM are normal                Nose: without d/c or deformity               Neck: Neck supple. Gross normal ROM               Cardiovascular: Normal rate and regular rhythm.                 Pulmonary/Chest: Effort normal and breath sounds without rales or wheezing.                Abd:  Soft, NT, ND, + BS, no organomegaly               Neurological: Pt is alert. At baseline orientation, motor grossly intact               Skin: Skin is warm. LE edema - none, post left pinna with 1 x 1 cm area redness itchy scaly               Psychiatric: Pt behavior is normal without agitation   Micro: none  Cardiac tracings I have personally interpreted today:  none  Pertinent Radiological findings (summarize): none   Lab Results  Component Value Date   WBC 9.8 01/23/2023   HGB 15.4 01/23/2023   HCT 47.6 01/23/2023   PLT 356.0 01/23/2023   GLUCOSE 85 01/23/2023   CHOL 170 11/28/2022   TRIG 167.0 (H) 11/28/2022   HDL 42.00 11/28/2022   LDLDIRECT 169.0 06/24/2018   LDLCALC 94 11/28/2022   ALT 12 01/23/2023   AST 15 01/23/2023   NA 135 01/23/2023   K 4.8 01/23/2023   CL 100 01/23/2023   CREATININE 1.41 01/23/2023   BUN 20 01/23/2023   CO2 28 01/23/2023   TSH 2.76 01/23/2023   PSA 0.00 (L) 11/28/2022   HGBA1C 6.0 (H) 01/10/2023   Assessment/Plan:  Erik Cola Sr. is a 77 y.o. White or Caucasian [1] male with  has a past medical history of Allergic rhinitis, Allergy, Anal fissure, Anemia, Anxiety, Blood transfusion without reported diagnosis (12/06/2022), Cataract, Coronary atherosclerosis of native coronary artery (2007), Degenerative arthritis of knee, bilateral (10/09/2017), Depression, Diverticulosis (12/18/2011), Diverticulosis of colon with hemorrhage, Duodenal mass, Fibromyalgia, GERD with stricture, Helicobacter pylori (H. pylori) infection (11/25/2012), Hiatal hernia, HLD (hyperlipidemia), HTN (hypertension), Hyperlipidemia, IBS (irritable bowel syndrome), Impotence of organic origin, Insomnia, Internal and external  hemorrhoids without complication, Interstitial cystitis, Irritable bowel syndrome (06/09/2010), Osteoporosis, Pelvic floor dysfunction (11/29/2017), Pneumonia, Reactive hypoglycemia, Rheumatoid arthritis(714.0), Somatization disorder (02/27/2012), and Vitamin D deficiency (11/29/2017).  Dermatitis Mild to mod, for triam cr prn,  to f/u any worsening symptoms or concerns  Hyperlipidemia  Uncontrolled, declines statin or repatha or zetia for now   Insomnia Overall stable, cont current med tx  Vitamin D deficiency Last vitamin D Lab Results  Component Value Date   VD25OH 32.16 11/28/2022   Low, to start oral replacement  Followup: Return if symptoms worsen or fail to improve.  Oliver Barre, MD 05/30/2023 9:04 PM [facility] [place] [facility] - [facility]

## 2023-05-28 NOTE — Patient Instructions (Signed)
 Please take all new medication as prescribed - the cream  Please continue all other medications as before, and refills have been done if requested.  Please have the pharmacy call with any other refills you may need.  Please keep your appointments with your specialists as you may have planned

## 2023-05-29 ENCOUNTER — Other Ambulatory Visit (HOSPITAL_COMMUNITY): Payer: Self-pay

## 2023-05-29 ENCOUNTER — Telehealth: Payer: Self-pay

## 2023-05-29 NOTE — Telephone Encounter (Signed)
Pharmacy Patient Advocate Encounter   Received notification from CoverMyMeds that prior authorization for REPATHA is required/requested.   Insurance verification completed.   The patient is insured through [facility] .   Per test claim: PA submitted to [facility] via CoverMyMeds Key/confirmation #/EOC [id number]      Status is pending

## 2023-05-30 ENCOUNTER — Encounter: Payer: Self-pay | Admitting: Internal Medicine

## 2023-05-30 NOTE — Telephone Encounter (Signed)
Pharmacy Patient Advocate Encounter  Received notification from [facility] that Prior Authorization for REPATHA has been APPROVED from 1.1.24 to 12.31.24.

## 2023-05-30 NOTE — Assessment & Plan Note (Signed)
Overall stable, cont current med tx 

## 2023-05-30 NOTE — Assessment & Plan Note (Signed)
Last vitamin D Lab Results  Component Value Date   VD25OH 32.16 11/28/2022   Low, to start oral replacement

## 2023-05-30 NOTE — Assessment & Plan Note (Signed)
  Uncontrolled, declines statin or repatha or zetia for now

## 2023-05-30 NOTE — Assessment & Plan Note (Signed)
Mild to mod, for triam cr prn,  to f/u any worsening symptoms or concerns

## 2023-06-06 ENCOUNTER — Other Ambulatory Visit: Payer: Self-pay | Admitting: Cardiovascular Disease

## 2023-06-17 DIAGNOSIS — N390 Urinary tract infection, site not specified: Secondary | ICD-10-CM | POA: Diagnosis not present

## 2023-06-17 DIAGNOSIS — M1990 Unspecified osteoarthritis, unspecified site: Secondary | ICD-10-CM | POA: Diagnosis not present

## 2023-06-18 DIAGNOSIS — K580 Irritable bowel syndrome with diarrhea: Secondary | ICD-10-CM | POA: Diagnosis not present

## 2023-06-22 DIAGNOSIS — Z932 Ileostomy status: Secondary | ICD-10-CM | POA: Diagnosis not present

## 2023-06-25 ENCOUNTER — Encounter: Payer: Self-pay | Admitting: Internal Medicine

## 2023-06-25 DIAGNOSIS — M25561 Pain in right knee: Secondary | ICD-10-CM | POA: Diagnosis not present

## 2023-06-26 NOTE — Telephone Encounter (Signed)
I am not sure what to say, except I can refer to Infectious Disease for further consideration if he would like, thnkas

## 2023-06-27 ENCOUNTER — Other Ambulatory Visit: Payer: Self-pay

## 2023-06-27 ENCOUNTER — Encounter: Payer: Self-pay | Admitting: Internal Medicine

## 2023-06-27 ENCOUNTER — Encounter: Payer: Self-pay | Admitting: Cardiovascular Disease

## 2023-06-27 MED ORDER — NITROGLYCERIN 0.4 MG SL SUBL: 0.4000 mg | SUBLINGUAL_TABLET | SUBLINGUAL | 3 refills | Status: DC | PRN

## 2023-07-01 ENCOUNTER — Other Ambulatory Visit: Payer: Self-pay | Admitting: *Deleted

## 2023-07-01 DIAGNOSIS — M6289 Other specified disorders of muscle: Secondary | ICD-10-CM | POA: Diagnosis not present

## 2023-07-01 DIAGNOSIS — Z862 Personal history of diseases of the blood and blood-forming organs and certain disorders involving the immune mechanism: Secondary | ICD-10-CM

## 2023-07-01 DIAGNOSIS — N200 Calculus of kidney: Secondary | ICD-10-CM | POA: Diagnosis not present

## 2023-07-01 DIAGNOSIS — D500 Iron deficiency anemia secondary to blood loss (chronic): Secondary | ICD-10-CM

## 2023-07-01 DIAGNOSIS — Z9889 Other specified postprocedural states: Secondary | ICD-10-CM | POA: Diagnosis not present

## 2023-07-01 DIAGNOSIS — Z906 Acquired absence of other parts of urinary tract: Secondary | ICD-10-CM | POA: Diagnosis not present

## 2023-07-01 DIAGNOSIS — N3010 Interstitial cystitis (chronic) without hematuria: Secondary | ICD-10-CM | POA: Diagnosis not present

## 2023-07-04 ENCOUNTER — Ambulatory Visit: Payer: Medicare Other | Admitting: Internal Medicine

## 2023-07-04 ENCOUNTER — Encounter: Payer: Self-pay | Admitting: Internal Medicine

## 2023-07-04 VITALS — BP 128/76 | HR 76 | Temp 97.8°F | Ht 67.0 in | Wt 167.0 lb

## 2023-07-04 DIAGNOSIS — M6289 Other specified disorders of muscle: Secondary | ICD-10-CM

## 2023-07-04 DIAGNOSIS — F32A Depression, unspecified: Secondary | ICD-10-CM

## 2023-07-04 DIAGNOSIS — N390 Urinary tract infection, site not specified: Secondary | ICD-10-CM

## 2023-07-04 DIAGNOSIS — E559 Vitamin D deficiency, unspecified: Secondary | ICD-10-CM | POA: Diagnosis not present

## 2023-07-04 MED ORDER — LORAZEPAM 0.5 MG PO TABS: 0.5000 mg | ORAL_TABLET | Freq: Two times a day (BID) | ORAL | 5 refills | Status: DC | PRN

## 2023-07-04 MED ORDER — DULOXETINE HCL 30 MG PO CPEP: 30.0000 mg | ORAL_CAPSULE | Freq: Every day | ORAL | 3 refills | Status: DC

## 2023-07-04 MED ORDER — TRAMADOL HCL 50 MG PO TABS: 50.0000 mg | ORAL_TABLET | Freq: Four times a day (QID) | ORAL | 1 refills | Status: DC | PRN

## 2023-07-04 NOTE — Progress Notes (Signed)
Patient ID: Erik Cola Sr., male   DOB: Nov 05, 1946, 77 y.o.   MRN: [id number]        Chief Complaint: follow up recent UTI       HPI:  Erik SLONE Sr. is a 77 y.o. male here with mention that he may need cbc and further testing due to recent UTI and need to r/o prior to any knee surgury.  Denies urinary symptoms such as dysuria, frequency, urgency, flank pain, hematuria or n/v, fever, chills.  Has no abd pain or left flank pain to suggest recurring infection and does not feel ill.  Has chroniic anxiety and pelvic floor dysfunction with pain but has not tolerated gabapentin.  Pt denies chest pain, increased sob or doe, wheezing, orthopnea, PND, increased LE swelling, palpitations, dizziness or syncope.  Denies worsening depressive symptoms, suicidal ideation, or panic; has ongoing anxiety,       Wt Readings from Last 3 Encounters:  07/04/23 167 lb (75.8 kg)  05/28/23 168 lb (76.2 kg)  02/08/23 168 lb (76.2 kg)   BP Readings from Last 3 Encounters:  07/04/23 128/76  05/28/23 124/76  02/08/23 104/67         Past Medical History:  Diagnosis Date   Allergic rhinitis    Allergy    Anal fissure    Anemia    Anxiety    Blood transfusion without reported diagnosis 12/06/2022   also 2 in the past   Cataract    starting   Coronary atherosclerosis of native coronary artery 2007   nonobstructive CAD by cath with 40% mid-LAD stenosis   Degenerative arthritis of knee, bilateral 10/09/2017   Depression    Diverticulosis 12/18/2011   Diverticulosis of colon with hemorrhage    April 2021, [facility] healthcare   Duodenal mass    Fibromyalgia    GERD with stricture    Helicobacter pylori (H. 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   Vitamin D deficiency 11/29/2017   Past Surgical History:  Procedure Laterality Date   ADENOIDECTOMY     bladder distention     x 6   CARDIAC CATHETERIZATION     CATHETER REMOVAL     super pubic area   COLONOSCOPY     CORONARY STENT INTERVENTION N/A 05/29/2022   Procedure: CORONARY STENT INTERVENTION;  Surgeon: Swaziland, Peter M, MD;  Location: [facility] INVASIVE CV LAB;  Service: Cardiovascular;  Laterality: N/A;   DENTAL SURGERY Right    #30 tooth extraction (right upper)   ESOPHAGOGASTRODUODENOSCOPY  12/17/2022   INTERSTIM IMPLANT PLACEMENT     INTERSTIM IMPLANT REMOVAL     KNEE ARTHROSCOPY     right x 2   LEFT HEART CATH AND CORONARY ANGIOGRAPHY N/A 05/29/2022   Procedure: LEFT HEART CATH AND CORONARY ANGIOGRAPHY;  Surgeon: Swaziland, Peter M, MD;  Location: [facility] INVASIVE CV LAB;  Service: Cardiovascular;  Laterality: N/A;  NISSEN FUNDOPLICATION  2009   PROSTECTOMY     ROBOT ASSISTED LAPAROSCOPIC COMPLETE CYSTECT ILEAL CONDUIT     SHOULDER ARTHROSCOPY  2010   left   TONSILLECTOMY AND ADENOIDECTOMY  1957   TOTAL KNEE ARTHROPLASTY Right 09/08/2021   Procedure: TOTAL KNEE ARTHROPLASTY;  Surgeon: Jene Every, MD;  Location: [facility];  Service: Orthopedics;  Laterality: Right;   TRANSURETHRAL RESECTION OF PROSTATE     x 2   UPPER GASTROINTESTINAL ENDOSCOPY  05/13/2008   hiatal hernia   VASECTOMY      reports that he has quit smoking. He has been exposed to tobacco smoke. He has never used smokeless tobacco. He reports that he does not drink alcohol and does not use drugs. family history includes Allergic rhinitis in his mother; Arthritis in his mother; Breast cancer in his sister; Colon polyps in his father; Diabetes in his father,  paternal uncle, and sister; Heart disease in his father and mother; Hypertension in his father and mother; Liver cancer in his maternal grandfather and paternal uncle; Stroke in his mother. Allergies  Allergen Reactions   Ambien [Zolpidem]     Unknown reaction   Bentyl [Dicyclomine]     Memory loss, anxiety, blurred vision   Carafate [Sucralfate] Nausea And Vomiting   Lansoprazole Diarrhea   Lexapro [Escitalopram Oxalate]     Unknown reaction    Other     Pt states he is sensitive to all oral medications   Pneumovax [Pneumococcal Polysaccharide Vaccine] Other (See Comments)    pain   Prilosec [Omeprazole] Other (See Comments)    Per patient joint pain with PPI's   Remeron [Mirtazapine]     Unknown reaction    Seroquel [Quetiapine] Other (See Comments)    Unknown reaction    Statins Other (See Comments)    Joint pain   Tizanidine Other (See Comments)    Made patient "feel crazy"   Tramadol Other (See Comments)    Pt can take small amounts up to twice daily but continued use causes chest / abdominal pain   Tylenol [Acetaminophen] Nausea And Vomiting    Can only take for 2 takes then he starts to have pain   Current Outpatient Medications on File Prior to Visit  Medication Sig Dispense Refill   nitroGLYCERIN (NITROSTAT) 0.4 MG SL tablet Place 1 tablet (0.4 mg total) under the tongue every 5 (five) minutes as needed for chest pain. 90 tablet 3   omega-3 acid ethyl esters (LOVAZA) 1 g capsule Take 1,250 mg by mouth daily.     triamcinolone cream (KENALOG) 0.1 % Apply 1 Application topically 2 (two) times daily. 30 g 0   No current facility-administered medications on file prior to visit.        ROS:  All others reviewed and negative.  Objective        PE:  BP 128/76 (BP Location: Right Arm, Patient Position: Sitting, Cuff Size: Normal)   Pulse 76   Temp 97.8 F (36.6 C) (Oral)   Ht 5\' 7"  (1.702 m)   Wt 167 lb (75.8 kg)   SpO2 98%   BMI 26.16 kg/m                  Constitutional: Pt appears in NAD               HENT: Head: NCAT.                Right Ear: External ear normal.  Left Ear: External ear normal.                Eyes: . Pupils are equal, round, and reactive to light. Conjunctivae and EOM are normal               Nose: without d/c or deformity               Neck: Neck supple. Gross normal ROM               Cardiovascular: Normal rate and regular rhythm.                 Pulmonary/Chest: Effort normal and breath sounds without rales or wheezing.                Abd:  Soft, NT, ND, + BS, no organomegaly               Neurological: Pt is alert. At baseline orientation, motor grossly intact               Skin: Skin is warm. No rashes, no other new lesions, LE edema - none               Psychiatric: Pt behavior is normal without agitation   Micro: none  Cardiac tracings I have personally interpreted today:  none  Pertinent Radiological findings (summarize): none   Lab Results  Component Value Date   WBC 9.8 01/23/2023   HGB 15.4 01/23/2023   HCT 47.6 01/23/2023   PLT 356.0 01/23/2023   GLUCOSE 85 01/23/2023   CHOL 170 11/28/2022   TRIG 167.0 (H) 11/28/2022   HDL 42.00 11/28/2022   LDLDIRECT 169.0 06/24/2018   LDLCALC 94 11/28/2022   ALT 12 01/23/2023   AST 15 01/23/2023   NA 135 01/23/2023   K 4.8 01/23/2023   CL 100 01/23/2023   CREATININE 1.41 01/23/2023   BUN 20 01/23/2023   CO2 28 01/23/2023   TSH 2.76 01/23/2023   PSA 0.00 (L) 11/28/2022   HGBA1C 6.0 (H) 01/10/2023   Assessment/Plan:  Erik Cola Sr. is a 77 y.o. White or Caucasian [1] male with  has a past medical history of Allergic rhinitis, Allergy, Anal fissure, Anemia, Anxiety, Blood transfusion without reported diagnosis (12/06/2022), Cataract, Coronary atherosclerosis of native coronary artery (2007), Degenerative arthritis of knee, bilateral (10/09/2017), Depression, Diverticulosis (12/18/2011), Diverticulosis of colon with hemorrhage, Duodenal  mass, Fibromyalgia, GERD with stricture, Helicobacter pylori (H. 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  Pelvic floor dysfunction With chronic pain, neuritic dysfunction - for cymbalta 30 every day, consider increase to 60 mg in 3-4 wks if not well enough improved  Depression Also hopefully to improve with cymbalta  Vitamin D deficiency Last vitamin D Lab Results  Component Value Date   VD25OH 32.16 11/28/2022   Low, to start oral replacement   UTI (urinary tract infection) Clinically resolved, ok to hold further testing or culture at this time  Followup: Return in about 6 months (around 01/04/2024).  Oliver Barre, MD 07/06/2023 3:19 PM [facility] [place] [facility] - [facility] Medicine

## 2023-07-04 NOTE — Patient Instructions (Addendum)
Please take all new medication as prescribed - the cymbalta 30 mg per day, and call in 3-4 wks for the higher dose if you are taking this ok  Please continue all other medications as before, and refills have been done if requested - ativan, tramadol  Please have the pharmacy call with any other refills you may need.  Please keep your appointments with your specialists as you may have planned  Please make an Appointment to return in 6 months, or sooner if needed

## 2023-07-06 ENCOUNTER — Encounter: Payer: Self-pay | Admitting: Internal Medicine

## 2023-07-06 DIAGNOSIS — N390 Urinary tract infection, site not specified: Secondary | ICD-10-CM | POA: Insufficient documentation

## 2023-07-06 NOTE — Assessment & Plan Note (Signed)
 Last vitamin D Lab Results  Component Value Date   VD25OH 32.16 11/28/2022   Low, to start oral replacement

## 2023-07-06 NOTE — Assessment & Plan Note (Signed)
Clinically resolved, ok to hold further testing or culture at this time

## 2023-07-06 NOTE — Assessment & Plan Note (Signed)
Also hopefully to improve with cymbalta

## 2023-07-06 NOTE — Assessment & Plan Note (Signed)
With chronic pain, neuritic dysfunction - for cymbalta 30 every day, consider increase to 60 mg in 3-4 wks if not well enough improved

## 2023-07-10 ENCOUNTER — Encounter: Payer: Self-pay | Admitting: Internal Medicine

## 2023-07-10 DIAGNOSIS — F32A Depression, unspecified: Secondary | ICD-10-CM

## 2023-07-10 DIAGNOSIS — F419 Anxiety disorder, unspecified: Secondary | ICD-10-CM

## 2023-07-11 ENCOUNTER — Inpatient Hospital Stay: Payer: Medicare Other | Attending: Hematology and Oncology | Admitting: Hematology and Oncology

## 2023-07-11 ENCOUNTER — Other Ambulatory Visit: Payer: Self-pay

## 2023-07-11 ENCOUNTER — Encounter: Payer: Self-pay | Admitting: Hematology and Oncology

## 2023-07-11 ENCOUNTER — Inpatient Hospital Stay: Payer: Medicare Other

## 2023-07-11 VITALS — BP 116/70 | HR 80 | Temp 97.9°F | Resp 16 | Wt 167.1 lb

## 2023-07-11 DIAGNOSIS — Z808 Family history of malignant neoplasm of other organs or systems: Secondary | ICD-10-CM | POA: Insufficient documentation

## 2023-07-11 DIAGNOSIS — D500 Iron deficiency anemia secondary to blood loss (chronic): Secondary | ICD-10-CM

## 2023-07-11 DIAGNOSIS — Z803 Family history of malignant neoplasm of breast: Secondary | ICD-10-CM | POA: Diagnosis not present

## 2023-07-11 DIAGNOSIS — Z862 Personal history of diseases of the blood and blood-forming organs and certain disorders involving the immune mechanism: Secondary | ICD-10-CM

## 2023-07-11 LAB — IRON AND IRON BINDING CAPACITY (CC-WL,HP ONLY)
Iron: 72 ug/dL (ref 45–182)
Saturation Ratios: 19 % (ref 17.9–39.5)
TIBC: 372 ug/dL (ref 250–450)
UIBC: 300 ug/dL

## 2023-07-11 LAB — CMP (CANCER CENTER ONLY)
ALT: 13 U/L (ref 0–44)
AST: 15 U/L (ref 15–41)
Albumin: 4.3 g/dL (ref 3.5–5.0)
Alkaline Phosphatase: 62 U/L (ref 38–126)
Anion gap: 7 (ref 5–15)
BUN: 31 mg/dL — ABNORMAL HIGH (ref 8–23)
CO2: 27 mmol/L (ref 22–32)
Calcium: 9.5 mg/dL (ref 8.9–10.3)
Chloride: 103 mmol/L (ref 98–111)
Creatinine: 1.43 mg/dL — ABNORMAL HIGH (ref 0.61–1.24)
GFR, Estimated: 51 mL/min — ABNORMAL LOW (ref >60)
Glucose, Bld: 86 mg/dL (ref 70–99)
Potassium: 4.3 mmol/L (ref 3.5–5.1)
Sodium: 137 mmol/L (ref 135–145)
Total Bilirubin: 0.4 mg/dL (ref 0.3–1.2)
Total Protein: 7.4 g/dL (ref 6.5–8.1)

## 2023-07-11 LAB — CBC WITH DIFFERENTIAL/PLATELET
Abs Immature Granulocytes: 0.04 10*3/uL (ref 0.00–0.07)
Basophils Absolute: 0.1 10*3/uL (ref 0.0–0.1)
Basophils Relative: 1 %
Eosinophils Absolute: 0.2 10*3/uL (ref 0.0–0.5)
Eosinophils Relative: 2 %
HCT: 44.4 % (ref 39.0–52.0)
Hemoglobin: 15.2 g/dL (ref 13.0–17.0)
Immature Granulocytes: 0 %
Lymphocytes Relative: 31 %
Lymphs Abs: 3.3 10*3/uL (ref 0.7–4.0)
MCH: 30.5 pg (ref 26.0–34.0)
MCHC: 34.2 g/dL (ref 30.0–36.0)
MCV: 89.2 fL (ref 80.0–100.0)
Monocytes Absolute: 1 10*3/uL (ref 0.1–1.0)
Monocytes Relative: 10 %
Neutro Abs: 6.3 10*3/uL (ref 1.7–7.7)
Neutrophils Relative %: 56 %
Platelets: 285 10*3/uL (ref 150–400)
RBC: 4.98 MIL/uL (ref 4.22–5.81)
RDW: 12.5 % (ref 11.5–15.5)
WBC: 10.9 10*3/uL — ABNORMAL HIGH (ref 4.0–10.5)
nRBC: 0 % (ref 0.0–0.2)

## 2023-07-11 NOTE — Progress Notes (Signed)
[facility] CONSULT NOTE  Patient Care Team: Corwin Levins, MD as PCP - General (Internal Medicine) O'Neal, Ronnald Ramp, MD as PCP - Cardiology (Cardiology) Corwin Levins, MD as Attending Physician (Internal Medicine) Jamison Neighbor, MD (Urology) Yvette Rack, OD as Consulting Physician (Optometry)  CHIEF COMPLAINTS/PURPOSE OF CONSULTATION:  Iron deficiency anemia  ASSESSMENT & PLAN:   Iron deficiency anemia due to chronic blood loss  This is a very pleasant 77 year old male patient with past medical history significant for GI bleed referred to hematology for further recommendations regarding his persistent iron deficiency and severe anemia.  He continues to have some fatigue, dizziness, and intermittent heart racing.  He is recovering from UTI. He says he is worried about knee prosthesis infection.  No concerns on physical exam.  CBC from today shows mild leukocytosis with white blood cell count of 10,900, hemoglobin normal at 15.2 and no evidence of thrombocytopenia.  At this time there is no concern for recurrence of anemia hence we will see him in about 6 months or sooner as needed.  He should continue to follow-up with his urologist for his UTI related issues. From my evaluation, he doesn't appear to have systemic infection Thank you for consulting on the care of this patient.  Please not hesitate to contact us with any additional questions or concerns.   No orders of the defined types were placed in this encounter. Total time spent: 30 minutes including history and physical, review of records, counseling and coordination of care  HISTORY OF PRESENTING ILLNESS:  Erik Cola Sr. 77 y.o. male is here because of Iron deficiency  This is a very pleasant 77 year old male patient with multiple medical comorbidities, GI bleed last year who presents to hematology for further recommendations regarding his iron deficiency.  Since his last visit here, his now recovering from  a UTI, had to take antibiotics.  He is very worried that if his UTI and the infection can hurt his knee prosthesis since he had knee replacement.  He denies any fevers or chills or flank tenderness or abdominal tenderness today.  He seems to be recovering from a UTI.  He continues to have some chronic fatigue, intermittent shortness of breath.  He has noted very minimal amount of bright red blood in stool especially when he wipes.  No black stool. Rest of the pertinent 10 point ROS reviewed and neg.  MEDICAL HISTORY:  Past Medical History:  Diagnosis Date   Allergic rhinitis    Allergy    Anal fissure    Anemia    Anxiety    Blood transfusion without reported diagnosis 12/06/2022   also 2 in the past   Cataract    starting   Coronary atherosclerosis of native coronary artery 2007   nonobstructive CAD by cath with 40% mid-LAD stenosis   Degenerative arthritis of knee, bilateral 10/09/2017   Depression    Diverticulosis 12/18/2011   Diverticulosis of colon with hemorrhage    April 2021, [facility]   Duodenal mass    Fibromyalgia    GERD with stricture    Helicobacter pylori (H. 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   Vitamin D deficiency 11/29/2017    SURGICAL HISTORY: Past Surgical History:  Procedure Laterality Date   ADENOIDECTOMY     bladder distention     x 6   CARDIAC CATHETERIZATION     CATHETER REMOVAL     super pubic area   COLONOSCOPY      CORONARY STENT INTERVENTION N/A 05/29/2022   Procedure: CORONARY STENT INTERVENTION;  Surgeon: Swaziland, Peter M, MD;  Location: [facility] INVASIVE CV LAB;  Service: Cardiovascular;  Laterality: N/A;   DENTAL SURGERY Right    #30 tooth extraction (right upper)   ESOPHAGOGASTRODUODENOSCOPY  12/17/2022   INTERSTIM IMPLANT PLACEMENT     INTERSTIM IMPLANT REMOVAL     KNEE ARTHROSCOPY     right x 2   LEFT HEART CATH AND CORONARY ANGIOGRAPHY N/A 05/29/2022   Procedure: LEFT HEART CATH AND CORONARY ANGIOGRAPHY;  Surgeon: Swaziland, Peter M, MD;  Location: [facility] INVASIVE CV LAB;  Service: Cardiovascular;  Laterality: N/A;   NISSEN FUNDOPLICATION  2009   PROSTECTOMY     ROBOT ASSISTED LAPAROSCOPIC COMPLETE CYSTECT ILEAL CONDUIT     SHOULDER ARTHROSCOPY  2010   left   TONSILLECTOMY AND ADENOIDECTOMY  1957   TOTAL KNEE ARTHROPLASTY Right 09/08/2021   Procedure: TOTAL KNEE ARTHROPLASTY;  Surgeon: Jene Every, MD;  Location: [facility] ORS;  Service: Orthopedics;  Laterality: Right;   TRANSURETHRAL RESECTION OF PROSTATE     x 2   UPPER GASTROINTESTINAL ENDOSCOPY  05/13/2008   hiatal hernia   VASECTOMY      SOCIAL HISTORY: Social History   Socioeconomic History   Marital status: Married    Spouse name: Not on file   Number of children: 4   Years of education: Not on file   Highest education level: Not on file  Occupational History   Occupation: retired  Tobacco Use   Smoking status: Former    Passive exposure: Past   Smokeless tobacco: Never   Tobacco comments:    quit 1980  Vaping Use   Vaping status: Never Used  Substance and Sexual Activity   Alcohol use: No    Alcohol/week: 0.0 standard drinks of alcohol   Drug use: No   Sexual activity: Yes    Partners: Female  Other Topics Concern   Not on file  Social History Narrative   Married, retired for children   He is a former smoker no alcohol or substance use   Social Determinants of Corporate investment banker Strain: Low Risk  (02/05/2023)    Overall Financial Resource Strain (CARDIA)    Difficulty of Paying Living Expenses: Not hard at all  Food Insecurity: No Food Insecurity (02/05/2023)   Hunger Vital Sign    Worried About Running Out of Food in the Last Year: Never true    Ran Out of Food in the Last Year: Never true  Transportation Needs: No Transportation Needs (02/05/2023)   PRAPARE - Administrator, Civil Service (Medical): No    Lack of Transportation (Non-Medical): No  Physical Activity: Inactive (02/05/2023)   Exercise Vital Sign    Days of Exercise per Week: 0 days    Minutes of Exercise per Session: 0 min  Stress: Stress Concern Present (  02/05/2023)   [place] [employer] - Occupational Stress Questionnaire    Feeling of Stress : Rather much  Social Connections: Moderately Integrated (02/05/2023)   Social Connection and Isolation Panel [NHANES]    Frequency of Communication with Friends and Family: Three times a week    Frequency of Social Gatherings with Friends and Family: Three times a week    Attends Religious Services: 1 to 4 times per year    Active Member of Clubs or Organizations: No    Attends Banker Meetings: Never    Marital Status: Married  Catering manager Violence: Not At Risk (02/05/2023)   Humiliation, Afraid, Rape, and Kick questionnaire    Fear of Current or Ex-Partner: No    Emotionally Abused: No    Physically Abused: No    Sexually Abused: No    FAMILY HISTORY: Family History  Problem Relation Age of Onset   Allergic rhinitis Mother    Heart disease Mother    Hypertension Mother    Stroke Mother    Arthritis Mother    Colon polyps Father    Diabetes Father    Heart disease Father    Hypertension Father    Diabetes Sister    Breast cancer Sister    Diabetes Paternal Uncle    Liver cancer Paternal Uncle    Liver cancer Maternal Grandfather    Colon cancer Neg Hx    Esophageal cancer Neg Hx    Rectal cancer Neg Hx    Stomach  cancer Neg Hx    Pancreatic cancer Neg Hx     ALLERGIES:  is allergic to [place] [zolpidem], bentyl [dicyclomine], carafate [sucralfate], lansoprazole, lexapro [escitalopram oxalate], other, pneumovax [pneumococcal polysaccharide vaccine], prilosec [omeprazole], remeron [mirtazapine], seroquel [quetiapine], statins, tizanidine, tramadol, and tylenol [acetaminophen].  MEDICATIONS:  Current Outpatient Medications  Medication Sig Dispense Refill   DULoxetine (CYMBALTA) 30 MG capsule Take 1 capsule (30 mg total) by mouth daily. 90 capsule 3   LORazepam (ATIVAN) 0.5 MG tablet Take 1 tablet (0.5 mg total) by mouth 2 (two) times daily as needed for anxiety. 60 tablet 5   nitroGLYCERIN (NITROSTAT) 0.4 MG SL tablet Place 1 tablet (0.4 mg total) under the tongue every 5 (five) minutes as needed for chest pain. 90 tablet 3   omega-3 acid ethyl esters (LOVAZA) 1 g capsule Take 1,250 mg by mouth daily.     traMADol (ULTRAM) 50 MG tablet Take 1 tablet (50 mg total) by mouth every 6 (six) hours as needed. 120 tablet 1   triamcinolone cream (KENALOG) 0.1 % Apply 1 Application topically 2 (two) times daily. 30 g 0   No current facility-administered medications for this visit.     PHYSICAL EXAMINATION: ECOG PERFORMANCE STATUS: 1 - Symptomatic but completely ambulatory  Vitals:   07/11/23 1546  BP: 116/70  Pulse: 80  Resp: 16  Temp: 97.9 F (36.6 C)  SpO2: 99%    Filed Weights   07/11/23 1546  Weight: 167 lb 1.6 oz (75.8 kg)   GENERAL:alert, no distress and comfortable Neck: palpable lymphadenopathy Chest: CTA bilaterally Heart: RRR PSYCH: alert & oriented x 3 with fluent speech NEURO: no focal motor/sensory deficits  LABORATORY DATA:  I have reviewed the data as listed Lab Results  Component Value Date   WBC 9.8 01/23/2023   HGB 15.4 01/23/2023   HCT 47.6 01/23/2023   MCV 81.8 01/23/2023   PLT 356.0 01/23/2023     Chemistry      Component  Value Date/Time   NA 135 01/23/2023 1620    NA 139 05/18/2022 1023   K 4.8 01/23/2023 1620   CL 100 01/23/2023 1620   CO2 28 01/23/2023 1620   BUN 20 01/23/2023 1620   BUN 22 05/18/2022 1023   CREATININE 1.41 01/23/2023 1620      Component Value Date/Time   CALCIUM 9.8 01/23/2023 1620   ALKPHOS 65 01/23/2023 1620   AST 15 01/23/2023 1620   ALT 12 01/23/2023 1620   BILITOT 0.4 01/23/2023 1620     CBC from today shows complete resolution of the anemia , mild leukocytosis.  RADIOGRAPHIC STUDIES: I have personally reviewed the radiological images as listed and agreed with the findings in the report. DG Foot Complete Left  Result Date: 07/03/2023 Please see detailed radiograph report in office note.  DG Foot Complete Right  Result Date: 07/03/2023 Please see detailed radiograph report in office note.   All questions were answered. The patient knows to call the clinic with any problems, questions or concerns. I spent 30 minutes in the care of this patient including H and P, review of records, counseling and coordination of care.     Rachel Moulds, MD 07/11/2023 3:56 PM

## 2023-07-11 NOTE — Progress Notes (Signed)
The test results show that your current treatment is OK, as the tests are stable.  Please continue the same plan.  There is no other need for change of treatment or further evaluation based on these results, at this time.  thanks 

## 2023-07-12 LAB — FERRITIN: Ferritin: 34 ng/mL (ref 24–336)

## 2023-08-06 NOTE — Progress Notes (Unsigned)
Cardiology Clinic Note   Patient Name: Erik CORELLA Sr. Date of Encounter: 08/08/2023  Primary Care Provider:  Corwin Levins, MD Primary Cardiologist:  Reatha Harps, MD  Patient Profile    77 y.o. male with a hx of CAD s/p PCI pLAD 05/28/2022, HTN, HL,, GI bleed, SVT ,hx of GI Bleed. Intolerant of statins and Zetia considered for PCSK9 inhibitor.  Last seen by Dr. Bufford Buttner on 02/08/2023.  Past Medical History    Past Medical History:  Diagnosis Date   Allergic rhinitis    Allergy    Anal fissure    Anemia    Anxiety    Blood transfusion without reported diagnosis 12/06/2022   also 2 in the past   Cataract    starting   Coronary atherosclerosis of native coronary artery 2007   nonobstructive CAD by cath with 40% mid-LAD stenosis   Degenerative arthritis of knee, bilateral 10/09/2017   Depression    Diverticulosis 12/18/2011   Diverticulosis of colon with hemorrhage    April 2021, [facility]   Duodenal mass    Fibromyalgia    GERD with stricture    Helicobacter pylori (H. 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   Vitamin D deficiency 11/29/2017   Past Surgical History:  Procedure Laterality Date   ADENOIDECTOMY     bladder distention     x 6   CARDIAC CATHETERIZATION     CATHETER REMOVAL     super pubic area   COLONOSCOPY     CORONARY STENT INTERVENTION N/A 05/29/2022   Procedure: CORONARY STENT INTERVENTION;  Surgeon: Swaziland, Peter M, MD;   Location: [facility] INVASIVE CV LAB;  Service: Cardiovascular;  Laterality: N/A;   DENTAL SURGERY Right    #30 tooth extraction (right upper)   ESOPHAGOGASTRODUODENOSCOPY  12/17/2022   INTERSTIM IMPLANT PLACEMENT     INTERSTIM IMPLANT REMOVAL     KNEE ARTHROSCOPY     right x 2   LEFT HEART CATH AND CORONARY ANGIOGRAPHY N/A 05/29/2022   Procedure: LEFT HEART CATH AND CORONARY ANGIOGRAPHY;  Surgeon: Swaziland, Peter M, MD;  Location: [facility] INVASIVE CV LAB;  Service: Cardiovascular;  Laterality: N/A;   NISSEN FUNDOPLICATION  2009   PROSTECTOMY     ROBOT ASSISTED LAPAROSCOPIC COMPLETE CYSTECT ILEAL CONDUIT     SHOULDER ARTHROSCOPY  2010   left   TONSILLECTOMY AND ADENOIDECTOMY  1957   TOTAL KNEE ARTHROPLASTY Right 09/08/2021   Procedure: TOTAL KNEE ARTHROPLASTY;  Surgeon: Jene Every, MD;  Location: [facility] ORS;  Service: Orthopedics;  Laterality: Right;   TRANSURETHRAL RESECTION OF PROSTATE     x 2   UPPER GASTROINTESTINAL ENDOSCOPY  05/13/2008   hiatal hernia   VASECTOMY      Allergies  Allergies  Allergen Reactions   Ambien [Zolpidem]     Unknown reaction   Bentyl [Dicyclomine]     Memory loss, anxiety, blurred vision  Carafate [Sucralfate] Nausea And Vomiting   Lansoprazole Diarrhea   Lexapro [Escitalopram Oxalate]     Unknown reaction    Other     Pt states he is sensitive to all oral medications   Pneumovax [Pneumococcal Polysaccharide Vaccine] Other (See Comments)    pain   Prilosec [Omeprazole] Other (See Comments)    Per patient joint pain with PPI's   Remeron [Mirtazapine]     Unknown reaction    Seroquel [Quetiapine] Other (See Comments)    Unknown reaction    Statins Other (See Comments)    Joint pain   Tizanidine Other (See Comments)    Made patient "feel crazy"   Tramadol Other (See Comments)    Pt can take small amounts up to twice daily but continued use causes chest / abdominal pain   Tylenol [Acetaminophen] Nausea And Vomiting    Can only take for 2 takes then he  starts to have pain    History of Present Illness    [name] returns to the office today for ongoing assessment and management of coronary artery disease status post DES to the pLAD 05/28/2022, hypertension, and hyperlipidemia.  The patient is complaining of right arm pain which awakens him at night,, and sometimes during the day at rest.  Nitroglycerin relieves.  He does have some mild associated shortness of breath but no chest pain dizziness palpitations.  He does complain of worsening fatigue.  He describes his pain is similar to the same pain he had prior to his cardiac catheterization 18 months ago.   The patient also has chronic pain and is on multiple medications for same.  He is being followed by his primary care provider for pain management, anxiety.   Home Medications    Current Outpatient Medications  Medication Sig Dispense Refill   DULoxetine (CYMBALTA) 30 MG capsule Take 1 capsule (30 mg total) by mouth daily. 90 capsule 3   LORazepam (ATIVAN) 0.5 MG tablet Take 1 tablet (0.5 mg total) by mouth 2 (two) times daily as needed for anxiety. 60 tablet 5   nitroGLYCERIN (NITROSTAT) 0.4 MG SL tablet Place 1 tablet (0.4 mg total) under the tongue every 5 (five) minutes as needed for chest pain. 90 tablet 3   omega-3 acid ethyl esters (LOVAZA) 1 g capsule Take 1,250 mg by mouth daily.     traMADol (ULTRAM) 50 MG tablet Take 1 tablet (50 mg total) by mouth every 6 (six) hours as needed. 120 tablet 1   triamcinolone cream (KENALOG) 0.1 % Apply 1 Application topically 2 (two) times daily. 30 g 0   No current facility-administered medications for this visit.     Family History    Family History  Problem Relation Age of Onset   Allergic rhinitis Mother    Heart disease Mother    Hypertension Mother    Stroke Mother    Arthritis Mother    Colon polyps Father    Diabetes Father    Heart disease Father    Hypertension Father    Diabetes Sister    Breast cancer Sister    Diabetes  Paternal Uncle    Liver cancer Paternal Uncle    Liver cancer Maternal Grandfather    Colon cancer Neg Hx    Esophageal cancer Neg Hx    Rectal cancer Neg Hx    Stomach cancer Neg Hx    Pancreatic cancer Neg Hx    He indicated that his mother is deceased. He indicated that his  father is deceased. He indicated that only one of his three sisters is alive. He indicated that the status of his maternal grandfather is unknown. He indicated that the status of his paternal uncle is unknown. He indicated that the status of his neg hx is unknown.  Social History    Social History   Socioeconomic History   Marital status: Married    Spouse name: Not on file   Number of children: 4   Years of education: Not on file   Highest education level: Not on file  Occupational History   Occupation: retired  Tobacco Use   Smoking status: Former    Passive exposure: Past   Smokeless tobacco: Never   Tobacco comments:    quit 1980  Vaping Use   Vaping status: Never Used  Substance and Sexual Activity   Alcohol use: No    Alcohol/week: 0.0 standard drinks of alcohol   Drug use: No   Sexual activity: Yes    Partners: Female  Other Topics Concern   Not on file  Social History Narrative   Married, retired for children   He is a former smoker no alcohol or substance use   Social Determinants of Corporate investment banker Strain: Low Risk  (02/05/2023)   Overall Financial Resource Strain (CARDIA)    Difficulty of Paying Living Expenses: Not hard at all  Food Insecurity: No Food Insecurity (02/05/2023)   Hunger Vital Sign    Worried About Running Out of Food in the Last Year: Never true    Ran Out of Food in the Last Year: Never true  Transportation Needs: No Transportation Needs (02/05/2023)   PRAPARE - Administrator, Civil Service (Medical): No    Lack of Transportation (Non-Medical): No  Physical Activity: Inactive (02/05/2023)   Exercise Vital Sign    Days of Exercise per Week:  0 days    Minutes of Exercise per Session: 0 min  Stress: Stress Concern Present (02/05/2023)   [employer] of Occupational Health - Occupational Stress Questionnaire    Feeling of Stress : Rather much  Social Connections: Moderately Integrated (02/05/2023)   Social Connection and Isolation Panel [NHANES]    Frequency of Communication with Friends and Family: Three times a week    Frequency of Social Gatherings with Friends and Family: Three times a week    Attends Religious Services: 1 to 4 times per year    Active Member of Clubs or Organizations: No    Attends Banker Meetings: Never    Marital Status: Married  Catering manager Violence: Not At Risk (02/05/2023)   Humiliation, Afraid, Rape, and Kick questionnaire    Fear of Current or Ex-Partner: No    Emotionally Abused: No    Physically Abused: No    Sexually Abused: No     Review of Systems    General:  No chills, fever, night sweats or weight changes.  Worsening fatigue. Cardiovascular:  No chest pain, dyspnea on exertion, edema, orthopnea, palpitations, paroxysmal nocturnal dyspnea. Dermatological: No rash, lesions/masses Respiratory: No cough, dyspnea Urologic: No hematuria, dysuria Abdominal:   No nausea, vomiting, diarrhea, bright red blood per rectum, melena, or hematemesis Neurologic:  No visual changes, wkns, changes in mental status. All other systems reviewed and are otherwise negative except as noted above.  EKG Interpretation Date/Time:  Thursday August 08 2023 15:21:37 EDT Ventricular Rate:  73 PR Interval:  168 QRS Duration:  92 QT Interval:  394 QTC Calculation:  434 R Axis:   42  Text Interpretation: Normal sinus rhythm Anterolateral infarct , age undetermined When compared with ECG of 29-May-2022 11:58, No significant change was found Confirmed by Joni Reining [phone]) on 08/08/2023 4:20:21 PM    Physical Exam    VS:  BP 128/72   Pulse (!) 58   Ht 5\' 7"  (1.702 m)   Wt 170 lb  6.4 oz (77.3 kg)   SpO2 94%   BMI 26.69 kg/m  , BMI Body mass index is 26.69 kg/m.     GEN: Well nourished, well developed, in no acute distress. HEENT: normal. Neck: Supple, no JVD, carotid bruits, or masses. Cardiac: RRR, no murmurs, rubs, or gallops. No clubbing, cyanosis, edema.  Radials/DP/PT 2+ and equal bilaterally.  Respiratory:  Respirations regular and unlabored, clear to auscultation bilaterally. GI: Soft, nontender, nondistended, BS + x 4. MS: no deformity or atrophy. Skin: warm and dry, no rash. Neuro:  Strength and sensation are intact. Psych: Normal affect.  EKG Interpretation Date/Time:  Thursday August 08 2023 15:21:37 EDT Ventricular Rate:  73 PR Interval:  168 QRS Duration:  92 QT Interval:  394 QTC Calculation: 434 R Axis:   42  Text Interpretation: Normal sinus rhythm Anterolateral infarct , age undetermined When compared with ECG of 29-May-2022 11:58, No significant change was found Confirmed by Joni Reining [phone]) on 08/08/2023 4:20:21 PM   Lab Results  Component Value Date   WBC 10.9 (H) 07/11/2023   HGB 15.2 07/11/2023   HCT 44.4 07/11/2023   MCV 89.2 07/11/2023   PLT 285 07/11/2023   Lab Results  Component Value Date   CREATININE 1.43 (H) 07/11/2023   BUN 31 (H) 07/11/2023   NA 137 07/11/2023   K 4.3 07/11/2023   CL 103 07/11/2023   CO2 27 07/11/2023   Lab Results  Component Value Date   ALT 13 07/11/2023   AST 15 07/11/2023   ALKPHOS 62 07/11/2023   BILITOT 0.4 07/11/2023   Lab Results  Component Value Date   CHOL 170 11/28/2022   HDL 42.00 11/28/2022   LDLCALC 94 11/28/2022   LDLDIRECT 169.0 06/24/2018   TRIG 167.0 (H) 11/28/2022   CHOLHDL 4 11/28/2022    Lab Results  Component Value Date   HGBA1C 6.0 (H) 01/10/2023     Review of Prior Studies Echocardiogram 05/21/2022  1. Left ventricular ejection fraction, by estimation, is 60 to 65%. The  left ventricle has normal function. The left ventricle has no regional   wall motion abnormalities. Left ventricular diastolic parameters are  consistent with Grade I diastolic  dysfunction (impaired relaxation).   2. Right ventricular systolic function is normal. The right ventricular  size is normal. There is normal pulmonary artery systolic pressure. The  estimated right ventricular systolic pressure is 27.4 mmHg.   3. The mitral valve is grossly normal. Trivial mitral valve  regurgitation. No evidence of mitral stenosis.   4. The aortic valve is tricuspid. Aortic valve regurgitation is mild. No  aortic stenosis is present.   5. The inferior vena cava is dilated in size with >50% respiratory  variability, suggesting right atrial pressure of 8 mmHg.   LHC 05/29/2022  EKG Interpretation Date/Time:  Thursday August 08 2023 15:21:37 EDT Ventricular Rate:  73 PR Interval:  168 QRS Duration:  92 QT Interval:  394 QTC Calculation: 434 R Axis:   42  Text Interpretation: Normal sinus rhythm Anterolateral infarct , age undetermined When compared with ECG of 29-May-2022 11:58, No  significant change was found Confirmed by Joni Reining [phone]) on 08/08/2023 4:20:21 PM   Ost RCA lesion is 50% stenosed.   Prox LAD to Mid LAD lesion is 70% stenosed.   Prox LAD lesion is 90% stenosed.   Mid Cx lesion is 50% stenosed.   A drug-eluting stent was successfully placed using a SYNERGY XD 3.0X20.   Post intervention, there is a 0% residual stenosis.   Post intervention, there is a 0% residual stenosis.   LV end diastolic pressure is normal.   Single vessel obstructive CAD Normal LVEDP Successful PCI of the proximal to mid LAD with OCT guidance and DES x 1   Plan: DAPT for 6 months. Anticipate same day DC.   Diagnostic Dominance: Right  Intervention   Assessment & Plan   1.  Coronary artery disease: History of PCI to the proximal LAD on 05/28/2022.  The patient is having recurrent symptoms similar to that which he experienced prior to stent placement.  This  is specifically right arm pain and fatigue.  He states he never actually had chest pain or shortness of breath.  He does state that he has a "smothering feeling" when this occurs.  The pain is relieved with nitroglycerin sublingual.  It has awaken him at night on occasion.  He is stating that this began in January 2024 and has become progressively more frequent.  I have discussed with him repeating cardiac catheterization or doing a nuclear medicine study.  He prefers to do something noninvasive at this time.  He does have newly refilled nitroglycerin.  Will plan Lexiscan Myoview for diagnostic prognostic purposes to evaluate for new areas of ischemia.  I have talked to him about ED precautions should the discomfort in his arm become worse.  He verbalizes understanding.  2.  Hypertension: Blood pressure today is well-controlled.  He is currently not on any antihypertensive medications.  3.  Hypercholesterolemia: The patient is intolerant of statins and Zetia.  Goal of LDL less than 70.  He was to be seen by pharmacy to be considered for PCSK9 inhibition and this did not come to fruition.  He will need to have fasting lipid profile for reevaluation of his status.  Most recent labs November 28, 2022, total cholesterol 170, LDL 94, HDL 42, triglycerides 167.  4.  Chronic pain syndrome.  He has been unable to follow-up with PCP for pain management.  I have referred him to pain management clinic and they will report findings to PCP not cardiology.        Signed, Bettey Mare. Liborio Nixon, ANP, AACC   08/08/2023 4:23 PM      Office [phone] Fax [phone]  Notice: This dictation was prepared with Dragon dictation along with smaller phrase technology. Any transcriptional errors that result from this process are unintentional and may not be corrected upon review.

## 2023-08-08 ENCOUNTER — Encounter: Payer: Self-pay | Admitting: Adult Health

## 2023-08-08 ENCOUNTER — Ambulatory Visit: Payer: Medicare Other | Attending: Student | Admitting: Adult Health

## 2023-08-08 ENCOUNTER — Ambulatory Visit: Payer: Medicare Other | Admitting: Student

## 2023-08-08 VITALS — BP 128/72 | HR 58 | Ht 67.0 in | Wt 170.4 lb

## 2023-08-08 DIAGNOSIS — I10 Essential (primary) hypertension: Secondary | ICD-10-CM

## 2023-08-08 DIAGNOSIS — G894 Chronic pain syndrome: Secondary | ICD-10-CM | POA: Diagnosis not present

## 2023-08-08 DIAGNOSIS — I25110 Atherosclerotic heart disease of native coronary artery with unstable angina pectoris: Secondary | ICD-10-CM | POA: Diagnosis not present

## 2023-08-08 DIAGNOSIS — E7800 Pure hypercholesterolemia, unspecified: Secondary | ICD-10-CM | POA: Diagnosis not present

## 2023-08-08 DIAGNOSIS — I25118 Atherosclerotic heart disease of native coronary artery with other forms of angina pectoris: Secondary | ICD-10-CM

## 2023-08-08 DIAGNOSIS — R002 Palpitations: Secondary | ICD-10-CM

## 2023-08-08 DIAGNOSIS — I200 Unstable angina: Secondary | ICD-10-CM

## 2023-08-08 MED ORDER — FUROSEMIDE 20 MG PO TABS
20.0000 mg | ORAL_TABLET | Freq: Every day | ORAL | 3 refills | Status: DC
Start: 2023-08-08 — End: 2023-08-08

## 2023-08-08 MED ORDER — POTASSIUM CHLORIDE CRYS ER 20 MEQ PO TBCR: 20.0000 meq | EXTENDED_RELEASE_TABLET | Freq: Two times a day (BID) | ORAL | 3 refills | Status: DC

## 2023-08-08 NOTE — Patient Instructions (Addendum)
Medication Instructions:  NONE    Lab Work: NONE   Testing/Procedures: [address]  Your physician has requested that you have a Scientist, physiological. For further information please visit [email or web address] Please follow instruction sheet, as given.    Follow-Up: At [facility], you and your health needs are our priority.  As part of our continuing mission to provide you with exceptional heart care, we have created designated Provider Care Teams.  These Care Teams include your primary Cardiologist (physician) and Advanced Practice Providers (APPs -  Physician Assistants and Nurse Practitioners) who all work together to provide you with the care you need, when you need it.   Your next appointment:   2 weeks post Lexiscan  Provider:   Joni Reining

## 2023-08-09 ENCOUNTER — Encounter: Payer: Self-pay | Admitting: Cardiovascular Disease

## 2023-08-09 NOTE — Addendum Note (Signed)
Addended by: Myna Hidalgo A on: 08/09/2023 01:36 PM   Modules accepted: Orders

## 2023-08-12 ENCOUNTER — Encounter: Payer: Self-pay | Admitting: Internal Medicine

## 2023-08-15 ENCOUNTER — Ambulatory Visit (INDEPENDENT_AMBULATORY_CARE_PROVIDER_SITE_OTHER): Payer: Medicare Other | Admitting: Behavioral Health

## 2023-08-15 ENCOUNTER — Ambulatory Visit (HOSPITAL_COMMUNITY): Payer: Medicare Other

## 2023-08-15 DIAGNOSIS — F450 Somatization disorder: Secondary | ICD-10-CM | POA: Diagnosis not present

## 2023-08-15 DIAGNOSIS — F341 Dysthymic disorder: Secondary | ICD-10-CM | POA: Diagnosis not present

## 2023-08-15 DIAGNOSIS — F419 Anxiety disorder, unspecified: Secondary | ICD-10-CM | POA: Diagnosis not present

## 2023-08-15 NOTE — Progress Notes (Signed)
                Anastasya Jewell L Farryn Linares, LMFT 

## 2023-08-15 NOTE — Progress Notes (Signed)
[facility] Behavioral Health Counselor Initial Adult Exam  Name: Erik SCATURRO Sr. Date: 08/15/2023 MRN: [id number] DOB: 1946/08/11 PCP: Corwin Levins, MD  Time spent: 60 min Caregility video; Pt is home in Office w/privacy & Provider working remotely from [employer]. Pt is aware of risks/limitations of telehealth & consents to Tx today. Time In: 3:00pm Time Out: 4:00pm  Guardian/Payee:  [employer] Medicare    Paperwork requested: No   Reason for Visit /Presenting Problem: Pt c/o chronic pain & need for med mgmt of complex medication regime. Pt sts "it has taken 9 mos to get this appt w/you". Pt is frustrated.  Mental Status Exam: Appearance:   Casual     Behavior:  Appropriate, Sharing, and irritated  Motor:  Normal  Speech/Language:   Clear and Coherent  Affect:  Appropriate  Mood:  irritable  Thought process:  normal  Thought content:    Rumination  Sensory/Perceptual disturbances:    WNL  Orientation:  oriented to person, place, time/date, and situation  Attention:  Good  Concentration:  Fair  Memory:  WNL  Fund of knowledge:   Good  Insight:    Fair  Judgment:   Fair  Impulse Control:  Fair    Risk Assessment: Danger to Self:  No Self-injurious Behavior: No Danger to Others: No Duty to Warn:no Physical Aggression / Violence:No  Access to Firearms a concern: No  Gang Involvement:No  Patient / guardian was educated about steps to take if suicide or homicide risk level increases between visits: yes; appropriate to ICD process While future psychiatric events cannot be accurately predicted, the patient does not currently require acute inpatient psychiatric care and does not currently meet [facility] involuntary commitment criteria.  Substance Abuse History: Current substance abuse:  Undetermined today due to complex medication regime that requires monitoring by a Psychiatrist who is familiar w/Pt condition     Past Psychiatric History:   Previous psychological history  is significant for Dx of Somatization d/o in 2013 Outpatient Providers:Dr. Oliver Barre, MD History of Psych Hospitalization:  Unk Psychological Testing:  Non reported today    Abuse History:  Victim of: No.,  NA    Report needed: No. Victim of Neglect:No. Perpetrator of  NA   Witness / Exposure to Domestic Violence: No   Protective Services Involvement: No  Witness to [employer] Violence:  No   Family History:  Family History  Problem Relation Age of Onset   Allergic rhinitis Mother    Heart disease Mother    Hypertension Mother    Stroke Mother    Arthritis Mother    Colon polyps Father    Diabetes Father    Heart disease Father    Hypertension Father    Diabetes Sister    Breast cancer Sister    Diabetes Paternal Uncle    Liver cancer Paternal Uncle    Liver cancer Maternal Grandfather    Colon cancer Neg Hx    Esophageal cancer Neg Hx    Rectal cancer Neg Hx    Stomach cancer Neg Hx    Pancreatic cancer Neg Hx     Living situation: the patient lives with their spouse  Sexual Orientation: Straight  Relationship Status: married  Name of spouse / other:[name] If a parent, number of children / ages: Adult Son [name]  Support Systems: spouse Family  Financial Stress:   Unk  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 History of legal issue / charges:  NA  Medical History/Surgical  History: not reviewed Past Medical History:  Diagnosis Date   Allergic rhinitis    Allergy    Anal fissure    Anemia    Anxiety    Blood transfusion without reported diagnosis 12/06/2022   also 2 in the past   Cataract    starting   Coronary atherosclerosis of native coronary artery 2007   nonobstructive CAD by cath with 40% mid-LAD stenosis   Degenerative arthritis of knee, bilateral 10/09/2017   Depression    Diverticulosis 12/18/2011   Diverticulosis of colon with hemorrhage    April 2021, [facility]   Duodenal mass    Fibromyalgia    GERD with stricture    Helicobacter pylori (H. 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   Vitamin D deficiency 11/29/2017    Past Surgical History:  Procedure Laterality Date   ADENOIDECTOMY     bladder distention     x 6   CARDIAC CATHETERIZATION     CATHETER REMOVAL     super pubic area   COLONOSCOPY     CORONARY STENT INTERVENTION N/A 05/29/2022   Procedure: CORONARY STENT INTERVENTION;  Surgeon: Swaziland, Peter M, MD;  Location: [facility] INVASIVE CV LAB;  Service: Cardiovascular;  Laterality: N/A;   DENTAL SURGERY Right    #30 tooth extraction (right upper)   ESOPHAGOGASTRODUODENOSCOPY  12/17/2022   INTERSTIM IMPLANT PLACEMENT     INTERSTIM IMPLANT REMOVAL     KNEE ARTHROSCOPY     right x 2   LEFT HEART CATH AND CORONARY ANGIOGRAPHY N/A 05/29/2022   Procedure: LEFT HEART CATH AND CORONARY ANGIOGRAPHY;  Surgeon: Swaziland,  Peter M, MD;  Location: [facility] INVASIVE CV LAB;  Service: Cardiovascular;  Laterality: N/A;   NISSEN FUNDOPLICATION  2009   PROSTECTOMY     ROBOT ASSISTED LAPAROSCOPIC COMPLETE CYSTECT ILEAL CONDUIT     SHOULDER ARTHROSCOPY  2010   left   TONSILLECTOMY AND ADENOIDECTOMY  1957   TOTAL KNEE ARTHROPLASTY Right 09/08/2021   Procedure: TOTAL KNEE ARTHROPLASTY;  Surgeon: Jene Every, MD;  Location: [facility] ORS;  Service: Orthopedics;  Laterality: Right;   TRANSURETHRAL RESECTION OF PROSTATE     x 2   UPPER GASTROINTESTINAL ENDOSCOPY  05/13/2008   hiatal hernia   VASECTOMY      Medications: Current Outpatient Medications  Medication Sig Dispense Refill   DULoxetine (CYMBALTA) 30 MG  capsule Take 1 capsule (30 mg total) by mouth daily. 90 capsule 3   LORazepam (ATIVAN) 0.5 MG tablet Take 1 tablet (0.5 mg total) by mouth 2 (two) times daily as needed for anxiety. 60 tablet 5   nitroGLYCERIN (NITROSTAT) 0.4 MG SL tablet Place 1 tablet (0.4 mg total) under the tongue every 5 (five) minutes as needed for chest pain. 90 tablet 3   omega-3 acid ethyl esters (LOVAZA) 1 g capsule Take 1,250 mg by mouth daily.     traMADol (ULTRAM) 50 MG tablet Take 1 tablet (50 mg total) by mouth every 6 (six) hours as needed. 120 tablet 1   triamcinolone cream (KENALOG) 0.1 % Apply 1 Application topically 2 (two) times daily. 30 g 0   No current facility-administered medications for this visit.    Allergies  Allergen Reactions   Ambien [Zolpidem]     Unknown reaction   Bentyl [Dicyclomine]     Memory loss, anxiety, blurred vision   Carafate [Sucralfate] Nausea And Vomiting   Lansoprazole Diarrhea   Lexapro [Escitalopram Oxalate]     Unknown reaction    Other     Pt states he is sensitive to all oral medications   Pneumovax [Pneumococcal Polysaccharide Vaccine] Other (See Comments)    pain   Prilosec [Omeprazole] Other (See Comments)    Per patient joint pain with PPI's   Remeron [Mirtazapine]     Unknown  reaction    Seroquel [Quetiapine] Other (See Comments)    Unknown reaction    Statins Other (See Comments)    Joint pain   Tizanidine Other (See Comments)    Made patient "feel crazy"   Tramadol Other (See Comments)    Pt can take small amounts up to twice daily but continued use causes chest / abdominal pain   Tylenol [Acetaminophen] Nausea And Vomiting    Can only take for 2 takes then he starts to have pain    Diagnoses:  Persistent depressive disorder  Somatization disorder  Anxiety  Plan of Care: Annette Stable will secure an appt w/a Preferred Psychiatrist to manage his medication regime more closely. He will secure this appt since his PCP has done all he can do in the manner of medication mgmt @ this time per Pt report.   Target Date: 1030/2024  Progress: 2  Frequency: Once every 2-3 wks  Modality: Claretta Fraise, LMFT

## 2023-08-19 ENCOUNTER — Other Ambulatory Visit: Payer: Self-pay | Admitting: *Deleted

## 2023-08-19 ENCOUNTER — Encounter (HOSPITAL_COMMUNITY): Payer: Self-pay

## 2023-08-19 ENCOUNTER — Encounter: Payer: Self-pay | Admitting: Hematology and Oncology

## 2023-08-19 DIAGNOSIS — I200 Unstable angina: Secondary | ICD-10-CM

## 2023-08-19 NOTE — Addendum Note (Signed)
Addended by: Jodelle Gross on: 08/19/2023 01:51 PM   Modules accepted: Orders

## 2023-08-20 ENCOUNTER — Encounter (HOSPITAL_COMMUNITY)
Admission: RE | Admit: 2023-08-20 | Discharge: 2023-08-20 | Disposition: A | Discharge status: Home / Self Care | Payer: Medicare Other | Source: Ambulatory Visit | Attending: Adult Health | Admitting: Adult Health

## 2023-08-20 DIAGNOSIS — R002 Palpitations: Secondary | ICD-10-CM

## 2023-08-20 DIAGNOSIS — I10 Essential (primary) hypertension: Secondary | ICD-10-CM

## 2023-08-20 DIAGNOSIS — I25118 Atherosclerotic heart disease of native coronary artery with other forms of angina pectoris: Secondary | ICD-10-CM

## 2023-08-20 DIAGNOSIS — I200 Unstable angina: Secondary | ICD-10-CM

## 2023-08-20 DIAGNOSIS — G894 Chronic pain syndrome: Secondary | ICD-10-CM

## 2023-08-20 DIAGNOSIS — E7800 Pure hypercholesterolemia, unspecified: Secondary | ICD-10-CM

## 2023-08-20 MED ORDER — REGADENOSON 0.4 MG/5ML IV SOLN: 0.4000 mg | Freq: Once | INTRAVENOUS | Status: DC

## 2023-08-20 NOTE — Progress Notes (Signed)
Called and spoke with Dr. Eden Emms.  Pt ate bacon, eggs and biscuit along with a cup of mint tea at 8:15.  Per Dr. Eden Emms patient would need to be rescheduled

## 2023-08-27 ENCOUNTER — Telehealth (HOSPITAL_COMMUNITY): Payer: Self-pay | Admitting: Emergency Medicine

## 2023-08-27 ENCOUNTER — Telehealth: Payer: Self-pay

## 2023-08-27 ENCOUNTER — Encounter (HOSPITAL_COMMUNITY): Payer: Self-pay

## 2023-08-27 ENCOUNTER — Encounter: Payer: Self-pay | Admitting: Physical Medicine and Rehabilitation

## 2023-08-27 NOTE — Telephone Encounter (Signed)
Reaching out to patient to offer assistance regarding upcoming cardiac imaging study; pt verbalizes understanding of appt date/time, parking situation and where to check in, pre-test NPO status and medications ordered, and verified current allergies; name and call back number provided for further questions should they arise Cayne Yom RN Navigator Cardiac Imaging [facility] [phone] office [phone] cell 

## 2023-08-27 NOTE — Progress Notes (Deleted)
Cardiology Office Note:  .   Date:  08/27/2023  ID:  Erik Cola Sr., DOB 05/02/1946, MRN [id number] PCP: Corwin Levins, MD  [facility] Providers Cardiologist:  Reatha Harps, MD { Click to update primary MD,subspecialty MD or APP then REFRESH:1}   History of Present Illness: .   Erik Cola Sr. is a 77 y.o. male we are following for ongoing assessment and management of coronary artery disease, status post DES to the proximal LAD 05/28/2022, hypertension and hyperlipidemia.  When last seen in the office on 08/08/2023 he was complaining of pain in his left arm and worsening fatigue.  The left arm pain awakens him at night.  I repeated his nuclear medicine stress test to evaluate for evidence of ischemia .  ROS: ***  Studies Reviewed: .        *** Risk Assessment/Calculations:   {Does this patient have ATRIAL FIBRILLATION?[phone]} No BP recorded.  {Refresh Note OR Click here to enter BP  :1}***       Physical Exam:   VS:  There were no vitals taken for this visit.   Wt Readings from Last 3 Encounters:  08/08/23 170 lb 6.4 oz (77.3 kg)  07/11/23 167 lb 1.6 oz (75.8 kg)  07/04/23 167 lb (75.8 kg)    GEN: Well nourished, well developed in no acute distress NECK: No JVD; No carotid bruits CARDIAC: ***RRR, no murmurs, rubs, gallops RESPIRATORY:  Clear to auscultation without rales, wheezing or rhonchi  ABDOMEN: Soft, non-tender, non-distended EXTREMITIES:  No edema; No deformity   ASSESSMENT AND PLAN: .   ***    {Are you ordering a CV Procedure (e.g. stress test, cath, DCCV, TEE, etc)?   Press F2        :[id number]}  Dispo: ***  Signed, Bettey Mare. Liborio Nixon, ANP, AACC

## 2023-08-27 NOTE — Telephone Encounter (Signed)
Called patient to reschedule appointment for 11/4 due to patient scheduled for PET Scan on 10/17.

## 2023-08-27 NOTE — Telephone Encounter (Signed)
Called patient to reschedule appointment. Unable to leave message.

## 2023-08-29 ENCOUNTER — Ambulatory Visit
Admission: RE | Admit: 2023-08-29 | Discharge: 2023-08-29 | Disposition: A | Discharge status: Home / Self Care | Payer: Medicare Other | Source: Ambulatory Visit | Attending: Adult Health | Admitting: Adult Health

## 2023-08-29 ENCOUNTER — Ambulatory Visit: Payer: Medicare Other | Admitting: Adult Health

## 2023-08-29 DIAGNOSIS — I25118 Atherosclerotic heart disease of native coronary artery with other forms of angina pectoris: Secondary | ICD-10-CM | POA: Diagnosis not present

## 2023-08-29 DIAGNOSIS — I10 Essential (primary) hypertension: Secondary | ICD-10-CM | POA: Diagnosis not present

## 2023-08-29 DIAGNOSIS — E7800 Pure hypercholesterolemia, unspecified: Secondary | ICD-10-CM | POA: Insufficient documentation

## 2023-08-29 DIAGNOSIS — I200 Unstable angina: Secondary | ICD-10-CM | POA: Diagnosis not present

## 2023-08-29 DIAGNOSIS — R002 Palpitations: Secondary | ICD-10-CM | POA: Insufficient documentation

## 2023-08-29 DIAGNOSIS — G894 Chronic pain syndrome: Secondary | ICD-10-CM | POA: Diagnosis not present

## 2023-08-29 MED ORDER — RUBIDIUM RB82 GENERATOR (RUBYFILL)
25.0000 | PACK | Freq: Once | INTRAVENOUS | Status: AC
  Administered 2023-08-29: 19.94 via INTRAVENOUS

## 2023-08-29 MED ORDER — RUBIDIUM RB82 GENERATOR (RUBYFILL)
25.0000 | PACK | Freq: Once | INTRAVENOUS | Status: AC
  Administered 2023-08-29: 19.9 via INTRAVENOUS

## 2023-08-29 MED ORDER — REGADENOSON 0.4 MG/5ML IV SOLN
0.4000 mg | Freq: Once | INTRAVENOUS | Status: AC
  Administered 2023-08-29: 0.4 mg via INTRAVENOUS
  Filled 2023-08-29: qty 5

## 2023-08-29 MED ORDER — REGADENOSON 0.4 MG/5ML IV SOLN
INTRAVENOUS | Status: AC
  Filled 2023-08-29: qty 5

## 2023-08-29 NOTE — Progress Notes (Signed)
Pt tolerated exam without incident; vital signs within normal limits; pt denies lightheadedness or dizziness; encouraged to drink caffeine, eat meal; pt ambulatory to lobby steady gait noted

## 2023-09-02 ENCOUNTER — Encounter: Payer: Self-pay | Admitting: Internal Medicine

## 2023-09-02 LAB — NM PET CT CARDIAC PERFUSION MULTI W/ABSOLUTE BLOODFLOW
LV dias vol: 89 mL (ref 62–150)
LV sys vol: 36 mL
MBFR: 2.77
Nuc Rest EF: 60 %
Nuc Stress EF: 68 %
Peak HR: 90 {beats}/min
Rest HR: 71 {beats}/min
Rest MBF: 0.94 ml/g/min
Rest Nuclear Isotope Dose: 19.9 mCi
SRS: 0
SSS: 0
ST Depression (mm): 0 mm
Stress MBF: 2.6 ml/g/min
Stress Nuclear Isotope Dose: 19.9 mCi
TID: 0.92

## 2023-09-05 ENCOUNTER — Ambulatory Visit: Payer: Medicare Other | Admitting: Behavioral Health

## 2023-09-05 DIAGNOSIS — F341 Dysthymic disorder: Secondary | ICD-10-CM

## 2023-09-05 DIAGNOSIS — F419 Anxiety disorder, unspecified: Secondary | ICD-10-CM

## 2023-09-05 DIAGNOSIS — F450 Somatization disorder: Secondary | ICD-10-CM

## 2023-09-05 NOTE — Progress Notes (Signed)
[facility] Counselor/Therapist Progress Note  Patient ID: Erik Metter., MRN: [id number],    Date: 09/05/2023  Time Spent: 55 min Caregility video; Pt is home in private & Provider is working remotely from [employer]. Pt is aware of the risks/limitations of telehealth & consents to Tx today. Time In: 2:00pm Time Out: 2:55pm  Treatment Type: Individual Therapy  Reported Symptoms: Elevated anx/dep & stress due to health status changes & marital issues.   Mental Status Exam: Appearance:  Casual     Behavior: Appropriate and Sharing  Motor: Normal  Speech/Language:  Clear and Coherent  Affect: Appropriate  Mood: normal  Thought process: normal  Thought content:   WNL  Sensory/Perceptual disturbances:   WNL  Orientation: oriented to person, place, time/date, and situation  Attention: Good  Concentration: Good  Memory: WNL  Fund of knowledge:  Good  Insight:   Good  Judgment:  Good  Impulse Control: Good   Risk Assessment: Danger to Self:  No Self-injurious Behavior: No Danger to Others: No Duty to Warn:no Physical Aggression / Violence:No  Access to Firearms a concern: No  Gang Involvement:No   Subjective: Pt relates the relationship w/his Wife [name] & how his Hx of Interstitial Colitis has impacted his identity & his Wife Erik So. 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 He knows this has impacted him, his pain levels, & his outlook on life. During the marriage there has been give & take, & there have been trials & they came out the other side. Pt wants this to cont in healthy way.  Interventions: Grief Therapy and  Insight-Oriented  Diagnosis:Persistent depressive disorder  Somatization disorder  Anxiety  Plan: [name] is concerned for the health of his Wife Erik So, his marriage & his health. He tries to stay busy during the day so pain is under control. His health is like a prison to him. 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 He has Bible Studies, Documentaries on TV, & does Internet Res constantly. He will use the resources provided ([employer].com & [employer]met[employer].com)to assist his brain health & we will discuss the results of the Pain Clinic visit next session.  Target Date: 09/27/2023  Progress: 4  Frequency: Once every 2-3 mos  Modality: Claretta Fraise, LMFT

## 2023-09-05 NOTE — Progress Notes (Signed)
                Anastasya Jewell L Farryn Linares, LMFT 

## 2023-09-06 ENCOUNTER — Telehealth: Payer: Self-pay

## 2023-09-06 NOTE — Telephone Encounter (Addendum)
Results viewed by patient via MyChart.----- Message from Joni Reining sent at 09/02/2023  5:19 PM EDT ----- I reviewed coronary CTA.  It is normal low risk with no evidence of lack of blood flow to the heart muscle.  This is a good report.  It was noted that he does have a hiatal hernia.  May need to follow-up with PCP concerning this as this can also cause chest pain.

## 2023-09-06 NOTE — Progress Notes (Unsigned)
Subjective:    Patient ID: Erik Cola Sr., male    DOB: 01/22/1946, 77 y.o.   MRN: [id number]  HPI  HPI  [name] is a 77 y.o. year old male  who  has a past medical history of Allergic rhinitis, Allergy, Anal fissure, Anemia, Anxiety, Blood transfusion without reported diagnosis (12/06/2022), Cataract, Coronary atherosclerosis of native coronary artery (2007), Degenerative arthritis of knee, bilateral (10/09/2017), Depression, Diverticulosis (12/18/2011), Diverticulosis of colon with hemorrhage, Duodenal mass, Fibromyalgia, GERD with stricture, Helicobacter pylori (H. 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   They are presenting to PM&R clinic as a new patient for pain management evaluation. They were referred by Bailey Mech, NP for treatment of multifocal chronic pain.   Source: Right elbow, burning in L lower abdomen, and local left sided back pain the worst.  Inciting incident: Right arm - "I totalled this on a wrench 4-5 years ago"  Description of pain:   Right elbow - sharp, intermittent, worse at nighttime Abdomen - "gut failing", burning L low back - Dull burning, right around his Stim device  Exacerbating factors: bending forward, laying flat, sitting, standing, activity, rest, and cold Remitting factors: relaxation and bowel movement Red flag symptoms: No red flags for back pain endorsed in Hx or ROS and Hx Osteoporosis  Medications tried: Topical medications (mild effect) : Vapo rub, voltaren gel Nsaids ( can't take ) :   Tylenol  (mild effect) : Can take for 2-3 days, then has to stop - after that he starts to have chest pain; unsure if hernia related or heart related.   Opiates  (moderate effect) : Takes tramadol 2-3 days then cannot tolerate. Takes once at bedtime.   Gabapentin / Lyrica  (unsure of effect) : Gabapentin caused him to "feel crazy".   TCAs  (unsure of effect) : Elavil "caused me to be a zombie for about 2-3 days"  SNRIs  (unsure of effect) : Tried cymbalta, felt like "zombie".   Other  (moderate effect) : Flexeril - 2-3 days and they could not tolerate. "I'm in a state of sedation where I'm not asleep and I'm conscious but I'm not awake".   Other treatments: PT/OT  (unsure of effect) : Most recently for his right knee in 2023. Went to pelvic PT several years ago; "I didn't see immediate results" but only went 3-4 times. Has not been for his back. No home exercises. 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  Accupuncture/chiropractor/massage  (moderate effect) : Accupuncture for his knee "seemed to help" TENs unit (unsure of effect) : never tried; used to have a spinal cord stimulator but got removed.  Injections (unsure of effect) : Never into back or R arm.   Surgery (moderate effect) : Hiatal hernia surgery 2013; unsure when he last was GI. He just found out last week that it has recurred on his heart scan.   Had a surgical repair of his right arm planned but they aborted due to cardiac complications; was in July 2023.   Other  () : none  Goals for pain control: "I do fine when I rest good; when I don't have pain. So that's my goal". Burning pain and pelvic floor dysfunction are what  keep him awake.   Prior UDS results: No results found for: "LABOPIA", "COCAINSCRNUR", "LABBENZ", "AMPHETMU", "THCU", "LABBARB"   Pain Inventory Average Pain 0 Pain Right Now 0 My pain is burning and aching  In the last 24 hours, has pain interfered with the following? General activity 4 Relation  with others 6 Enjoyment of life 6 What TIME of day is your pain at its worst? night Sleep (in general) Fair  Pain is worse with: bending, sitting, and some activites Pain improves with: pacing activities Relief from Meds: 2  walk without assistance how many minutes can you walk? 15 ability to climb steps?  no do you drive?  yes transfers alone Do you have any goals in this area?  no  disabled: date disabled 12/13/1998 I need assistance with the following:  household duties Do you have any goals in this area?  no  bladder control problems bowel control problems weakness dizziness confusion depression anxiety  Any changes since last visit?  no  Any changes since last visit?  no    Family History  Problem Relation Age of Onset   Allergic rhinitis Mother    Heart disease Mother    Hypertension Mother    Stroke Mother    Arthritis Mother    Colon polyps Father    Diabetes Father    Heart disease Father    Hypertension Father    Diabetes Sister    Breast cancer Sister    Diabetes Paternal Uncle    Liver cancer Paternal Uncle    Liver cancer Maternal Grandfather    Colon cancer Neg Hx    Esophageal cancer Neg Hx    Rectal cancer Neg Hx    Stomach cancer Neg Hx    Pancreatic cancer Neg Hx    Social History   Socioeconomic History   Marital status: Married    Spouse name: Not on file   Number of children: 4   Years of education: Not on file   Highest education level: Not on file  Occupational History   Occupation: retired  Tobacco Use   Smoking status: Former    Passive exposure: Past   Smokeless tobacco: Never   Tobacco comments:    quit 1980  Vaping Use   Vaping status: Never Used  Substance and Sexual Activity   Alcohol use: No    Alcohol/week: 0.0 standard drinks of alcohol   Drug use: No   Sexual activity: Yes    Partners: Female  Other Topics Concern   Not on file  Social History Narrative   Married, retired for children   He is a former  smoker no alcohol or substance use   Social Determinants of Corporate investment banker Strain: Low Risk  (02/05/2023)   Overall Financial Resource Strain (CARDIA)    Difficulty of Paying Living Expenses: Not hard at all  Food Insecurity: No Food Insecurity (02/05/2023)   Hunger Vital Sign    Worried About Running Out of Food in the Last Year: Never true    Ran Out of Food in the Last Year: Never true  Transportation Needs: No Transportation Needs (02/05/2023)   PRAPARE - Administrator, Civil Service (Medical): No    Lack of Transportation (Non-Medical): No  Physical Activity: Inactive (02/05/2023)   Exercise Vital Sign    Days of Exercise per Week: 0 days    Minutes of Exercise per Session: 0 min  Stress: Stress Concern Present (02/05/2023)   [employer]  of Occupational Health - Occupational Stress Questionnaire    Feeling of Stress : Rather much  Social Connections: Moderately Integrated (02/05/2023)   Social Connection and Isolation Panel [NHANES]    Frequency of Communication with Friends and Family: Three times a week    Frequency of Social Gatherings with Friends and Family: Three times a week    Attends Religious Services: 1 to 4 times per year    Active Member of Clubs or Organizations: No    Attends Banker Meetings: Never    Marital Status: Married   Past Surgical History:  Procedure Laterality Date   ADENOIDECTOMY     bladder distention     x 6   CARDIAC CATHETERIZATION     CATHETER REMOVAL     super pubic area   COLONOSCOPY     CORONARY STENT INTERVENTION N/A 05/29/2022   Procedure: CORONARY STENT INTERVENTION;  Surgeon: Swaziland, Peter M, MD;  Location: [facility] INVASIVE CV LAB;  Service: Cardiovascular;  Laterality: N/A;   DENTAL SURGERY Right    #30 tooth extraction (right upper)   ESOPHAGOGASTRODUODENOSCOPY  12/17/2022   INTERSTIM IMPLANT PLACEMENT     INTERSTIM IMPLANT REMOVAL     KNEE ARTHROSCOPY     right x 2   LEFT HEART CATH AND  CORONARY ANGIOGRAPHY N/A 05/29/2022   Procedure: LEFT HEART CATH AND CORONARY ANGIOGRAPHY;  Surgeon: Swaziland, Peter M, MD;  Location: [facility] INVASIVE CV LAB;  Service: Cardiovascular;  Laterality: N/A;   NISSEN FUNDOPLICATION  2009   PROSTECTOMY     ROBOT ASSISTED LAPAROSCOPIC COMPLETE CYSTECT ILEAL CONDUIT     SHOULDER ARTHROSCOPY  2010   left   TONSILLECTOMY AND ADENOIDECTOMY  1957   TOTAL KNEE ARTHROPLASTY Right 09/08/2021   Procedure: TOTAL KNEE ARTHROPLASTY;  Surgeon: Jene Every, MD;  Location: [facility] ORS;  Service: Orthopedics;  Laterality: Right;   TRANSURETHRAL RESECTION OF PROSTATE     x 2   UPPER GASTROINTESTINAL ENDOSCOPY  05/13/2008   hiatal hernia   VASECTOMY     Past Medical History:  Diagnosis Date   Allergic rhinitis    Allergy    Anal fissure    Anemia    Anxiety    Blood transfusion without reported diagnosis 12/06/2022   also 2 in the past   Cataract    starting   Coronary atherosclerosis of native coronary artery 2007   nonobstructive CAD by cath with 40% mid-LAD stenosis   Degenerative arthritis of knee, bilateral 10/09/2017   Depression    Diverticulosis 12/18/2011   Diverticulosis of colon with hemorrhage    April 2021, [facility]   Duodenal mass    Fibromyalgia    GERD with stricture    Helicobacter pylori (H. 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   Vitamin D deficiency 11/29/2017   There were no vitals taken for this visit.  Opioid  Risk Score:  Fall Risk Score:  `1  Depression screen [facility] 2/9     07/04/2023    2:48 PM 05/28/2023    1:24 PM 02/05/2023    9:37 PM 11/28/2022    4:02 PM 11/28/2022    3:48 PM 10/26/2022    1:24 PM 09/11/2022    9:38 AM  Depression screen PHQ 2/9  Decreased Interest 1 0 1 1 0 0 1  Down, Depressed, Hopeless 1 1 1 1 1  0 1  PHQ - 2 Score 2 1 2 2 1  0 2  Altered sleeping 3 2 1    0 1  Tired, decreased energy 3 2 1    0 2  Change in appetite 1 2 0   0 1  Feeling bad or failure about yourself  0 1 0   0 1  Trouble concentrating 3 1 2    0 1  Moving slowly or fidgety/restless 3 1 0   0 1  Suicidal thoughts 3 2 0   0 0  PHQ-9 Score 18 12 6    0 9  Difficult doing work/chores Very difficult Somewhat difficult    Not difficult at all Somewhat difficult    Review of Systems  Cardiovascular:  Positive for chest pain.  Musculoskeletal:  Positive for back pain, gait problem and neck pain.       B/L elbow pain Chest pain Right knee pain Groin pain   All other systems reviewed and are negative.     Objective:   Physical Exam   PE: Constitution: Appropriate appearance for age. No apparent distress Resp: No respiratory distress. No accessory muscle usage. on RA and CTAB Cardio: Well perfused appearance. No peripheral edema. Abdomen: Mildly distended, minimal bowel sounds. No TTP. + RLQ urostomy.  Psych: Appropriate mood and affect. Neuro: AAOx4. No apparent cognitive deficits   Neurologic Exam:   DTRs: Reflexes were 2+ in bilateral achilles, patella, biceps, BR and triceps. Babinsky: flexor responses b/l.   Hoffmans: negative b/l Sensory exam: revealed normal sensation in all dermatomal regions in bilateral upper extremities and bilateral lower extremities Motor exam: strength 5/5 throughout bilateral upper extremities and bilateral lower extremities Coordination: Fine motor coordination was normal.   Gait: normal  MSK back: No apparent deformity. Lumbar lordosis WNL. No apparent  scoliosis. AROM complete.  + facet loading on L + TTP L lumbar paraspinals -Slump -SLR  MSK R arm: Inspection: Inspection of shoulder revealed no atrophy or gross abnormality. There is no muscle atrophy noted around the shoulder girdle.  Palpatory exam:  Palpation along the cervical musculature, clavicle, AC joint, anterior and posterior GH joint, Biceps tendon within the bicipital groove, deltoid, supraspinatus and infraspinatus were examined. There was no muscle spasm noted or trigger points noted.   ROM: Active complete  Strength: 4 /5 strength when testing the shoulder internal / external rotators, supraspinatus, biceps, deltoid.  Special Tests: Supraspinatus tests: Empty can  - ; Drop arm test  - Resisted deltoid test:  - Impingement signs: Hawkins  + ; Neers- Biceps tests: Speed's -, Alric Ran - Subscapularis tests:  Belly test + Slap lesions: Obriens  - ;  Instability tests: Apprehension - ;     Information in () parenthesis is normals/details of specific exam.      Assessment & Plan:  AMOS DELAPP Sr. is a 77 y.o. year old male  who  has a past medical history of Allergic rhinitis, Allergy, Anal fissure, Anemia, Anxiety, Blood transfusion without reported diagnosis (12/06/2022), Cataract, Coronary atherosclerosis of  native coronary artery (2007), Degenerative arthritis of knee, bilateral (10/09/2017), Depression, Diverticulosis (12/18/2011), Diverticulosis of colon with hemorrhage, Duodenal mass, Fibromyalgia, GERD with stricture, Helicobacter pylori (H. pylori) infection (11/25/2012), Hiatal hernia, HLD (hyperlipidemia), HTN (hypertension), Hyperlipidemia, IBS (irritable bowel syndrome), Impotence of organic origin, Insomnia, Internal and external hemorrhoids without complication, Interstitial cystitis, Irritable bowel syndrome (06/09/2010), Osteoporosis, Pelvic floor dysfunction (11/29/2017), Pneumonia, Reactive hypoglycemia, Rheumatoid arthritis(714.0), Somatization  disorder (02/27/2012), and Vitamin D deficiency (11/29/2017).   They are presenting to PM&R clinic as a new patient for treatment of chronic pain at right elbow.bicep, burning internal pain in L lower abdomen, and local left sided back pain without radiation.   Chronic left-sided low back pain without sciatica Use voltaren gel over the counter on your right arm and low back up to 2 times daily  Start Lyrica as below; may consider butrans in the future given difficulty tolerating PO medications.   Follow up with me in 2 months   Pelvic floor dysfunction I have referred you to pelvic PT  Irritable bowel syndrome, unspecified type -     Ambulatory referral to Gastroenterology  I have referred you back to [facility] for chronic constipation, multiple stools, and abdominal pain. Patient states he has been seen prior but was lost to follow up several years ago.  Abdominal pain, lower Stop gabapentin.  Use lidocaine cream for your lower abdominal burning pain.   Start Lyrica 25 mg at nighttime. After 1-2 weeks, message me through MyChart and if it is going well we will increase this to 2 capsules nightly. If unable to tolerate, we may stop and switch you to Butrans patch due to difficulty tolerating oral medications.  Unspecified injury of muscle, fascia and tendon of other parts of biceps, right arm, sequela  Voltaren gel as above  States he was prior scheduled for surgical repair in this area, unsure what. Surgery aborted d/t cardiovascular complications. Will re-address next visit.   Other orders -     Pregabalin; Take 1 capsule (25 mg total) by mouth at bedtime.  Dispense: 60 capsule; Refill: 0 -     Diclofenac Sodium; Apply 2 g topically 2 (two) times daily as needed. Up to twice daily to right arm and low back -     Lidocaine-Prilocaine; Apply 1 Application topically as needed. Apply to lower abdomen for burning pain  Dispense: 30 g; Refill: 0

## 2023-09-09 ENCOUNTER — Encounter: Payer: Self-pay | Admitting: Physical Medicine and Rehabilitation

## 2023-09-09 ENCOUNTER — Encounter
Payer: Medicare Other | Attending: Physical Medicine and Rehabilitation | Admitting: Physical Medicine and Rehabilitation

## 2023-09-09 VITALS — BP 145/80 | HR 80 | Ht 67.0 in | Wt 170.0 lb

## 2023-09-09 DIAGNOSIS — G8929 Other chronic pain: Secondary | ICD-10-CM | POA: Diagnosis not present

## 2023-09-09 DIAGNOSIS — R1030 Lower abdominal pain, unspecified: Secondary | ICD-10-CM

## 2023-09-09 DIAGNOSIS — S46201S Unspecified injury of muscle, fascia and tendon of other parts of biceps, right arm, sequela: Secondary | ICD-10-CM

## 2023-09-09 DIAGNOSIS — K589 Irritable bowel syndrome without diarrhea: Secondary | ICD-10-CM | POA: Diagnosis not present

## 2023-09-09 DIAGNOSIS — M545 Low back pain, unspecified: Secondary | ICD-10-CM

## 2023-09-09 DIAGNOSIS — M6289 Other specified disorders of muscle: Secondary | ICD-10-CM | POA: Diagnosis not present

## 2023-09-09 MED ORDER — PREGABALIN 25 MG PO CAPS: 25.0000 mg | ORAL_CAPSULE | Freq: Every day | ORAL | 0 refills | Status: DC

## 2023-09-09 MED ORDER — DICLOFENAC SODIUM 1 % EX GEL: 2.0000 g | Freq: Two times a day (BID) | CUTANEOUS | Status: DC | PRN

## 2023-09-09 MED ORDER — LIDOCAINE-PRILOCAINE 2.5-2.5 % EX CREA: 1.0000 | TOPICAL_CREAM | CUTANEOUS | 0 refills | Status: DC | PRN

## 2023-09-09 NOTE — Patient Instructions (Addendum)
Stop gabapentin.  Start Lyrica 25 mg at nighttime. After 1-2 weeks, message me through MyChart and if it is going well we will increase this to 2 capsules nightly. If unable to tolerate, we may stop and switch you to Butrans patch due to difficulty tolerating oral medications.  I have referred you to pelvic PT  I have referred you back to [facility] for chronic constipation, multiple stools, and abdominal pain.   Use voltaren gel over the counter on your right arm and low back up to 2 times daily  Use lidocaine cream over the counter for your lower abdominal burning pain.   Follow up with me in 2 months

## 2023-09-15 NOTE — Progress Notes (Unsigned)
  Cardiology Office Note:  .   Date:  09/16/2023  ID:  Erik Cola Sr., DOB [birth year], MRN [id number] PCP: Corwin Levins, MD  [facility] Providers Cardiologist:  Reatha Harps, MD  History of Present Illness: .   Erik Cola Sr. is a 77 y.o. male following for ongoing assessment and management of CAD s/p PCI pLAD 05/28/2022, HTN, HL,, GI bleed, SVT ,hx of GI Bleed. Intolerant of statins and Zetia considered for PCSK9 inhibitor.    On last office visit dated 08/08/2023 he was complaining of recurrent chest discomfort with exertion becoming more frequent.  I discussed repeating cardiac catheterization or doing a noninvasive study.  He chose noninvasive and he was scheduled for cardiac PET.  This was completed on 08/29/2023 and revealed normal LV perfusion with no evidence of ischemia the study was normal and low risk.  Noncardiac CT did not reveal any abnormalities with exception of a small hiatal hernia.  He comes today on follow-up with continued pain in his abdomen and chest.  He has been placed on tramadol which will run out tomorrow.  He has not yet followed up with GI, or PCP.  He is little frustrated as he continues to have pain and does not know what is causing it.   Studies Reviewed: .      Cardiac PET 08/29/2023  LV perfusion is normal. There is no evidence of ischemia. There is no evidence of infarction.   Rest left ventricular function is normal. Rest EF: 60%. Stress left ventricular function is normal. Stress EF: 68%. End diastolic cavity size is normal. End systolic cavity size is normal.   Myocardial blood flow was computed to be 0.59ml/g/min at rest and 2.18ml/g/min at stress. Global myocardial blood flow reserve was 2.77 and was normal.   Not commented on due to prior LaD stent   The study is normal. The study is low risk.  Physical Exam:   VS:  BP 130/70 (BP Location: Left Arm, Patient Position: Sitting, Cuff Size: Normal)   Pulse 76   Ht 5\' 7"  (1.702 m)   Wt  172 lb 3.2 oz (78.1 kg)   SpO2 94%   BMI 26.97 kg/m     Wt Readings from Last 3 Encounters:  09/16/23 172 lb 3.2 oz (78.1 kg)  09/09/23 170 lb (77.1 kg)  08/08/23 170 lb 6.4 oz (77.3 kg)    GEN: Well nourished, well developed in no acute distress NECK: No JVD; No carotid bruits CARDIAC: RRR, no murmurs, rubs, gallops RESPIRATORY:  Clear to auscultation without rales, wheezing or rhonchi  ABDOMEN: Soft, non-tender, non-distended EXTREMITIES:  No edema; No deformity   ASSESSMENT AND PLAN: .   CAD: Hx of  s/p PCI pLAD 05/28/2022 (SYNERGY XD 3.0X20. .  Cardiac PET scan was negative for new areas of ischemia.  Reassurance is given and explanation of test has been provided.  It is best that he follow-up with his PCP to evaluate for noncardiac chest pain.  2.  Hypercholesterolemia: Intolerant of statins but is taking Zetia.  Labs are followed by PCP with goal of LDL less than 70.  Consideration for Repatha if follow-up labs are abnormal and lipids are not well-controlled.  3.  Hypertension: Medication regimen is keeping his blood pressure well-controlled.  No changes in at this time.         Signed, Bettey Mare. Liborio Nixon, ANP, AACC

## 2023-09-16 ENCOUNTER — Encounter: Payer: Self-pay | Admitting: Adult Health

## 2023-09-16 ENCOUNTER — Ambulatory Visit: Payer: Medicare Other | Attending: Adult Health | Admitting: Adult Health

## 2023-09-16 VITALS — BP 130/70 | HR 76 | Ht 67.0 in | Wt 172.2 lb

## 2023-09-16 DIAGNOSIS — E7800 Pure hypercholesterolemia, unspecified: Secondary | ICD-10-CM

## 2023-09-16 DIAGNOSIS — I2510 Atherosclerotic heart disease of native coronary artery without angina pectoris: Secondary | ICD-10-CM | POA: Diagnosis not present

## 2023-09-16 DIAGNOSIS — I10 Essential (primary) hypertension: Secondary | ICD-10-CM | POA: Diagnosis not present

## 2023-09-16 NOTE — Patient Instructions (Signed)
Medication Instructions:  No Changes *If you need a refill on your cardiac medications before your next appointment, please call your pharmacy*   Lab Work: No Labs If you have labs (blood work) drawn today and your tests are completely normal, you will receive your results only by: MyChart Message (if you have MyChart) OR A paper copy in the mail If you have any lab test that is abnormal or we need to change your treatment, we will call you to review the results.   Testing/Procedures: No Testing   Follow-Up: At [facility], you and your health needs are our priority.  As part of our continuing mission to provide you with exceptional heart care, we have created designated Provider Care Teams.  These Care Teams include your primary Cardiologist (physician) and Advanced Practice Providers (APPs -  Physician Assistants and Nurse Practitioners) who all work together to provide you with the care you need, when you need it.  We recommend signing up for the patient portal called "MyChart".  Sign up information is provided on this After Visit Summary.  MyChart is used to connect with patients for Virtual Visits (Telemedicine).  Patients are able to view lab/test results, encounter notes, upcoming appointments, etc.  Non-urgent messages can be sent to your provider as well.   To learn more about what you can do with MyChart, go to [email or web address].    Your next appointment:   1 year(s)  Provider:   Reatha Harps, MD

## 2023-09-19 DIAGNOSIS — Z936 Other artificial openings of urinary tract status: Secondary | ICD-10-CM | POA: Diagnosis not present

## 2023-09-20 ENCOUNTER — Ambulatory Visit (INDEPENDENT_AMBULATORY_CARE_PROVIDER_SITE_OTHER): Payer: Medicare Other | Admitting: Behavioral Health

## 2023-09-20 DIAGNOSIS — F419 Anxiety disorder, unspecified: Secondary | ICD-10-CM | POA: Diagnosis not present

## 2023-09-20 DIAGNOSIS — F450 Somatization disorder: Secondary | ICD-10-CM | POA: Diagnosis not present

## 2023-09-20 DIAGNOSIS — F341 Dysthymic disorder: Secondary | ICD-10-CM | POA: Diagnosis not present

## 2023-09-20 NOTE — Progress Notes (Signed)
                Erik Jewell L Farryn Linares, LMFT 

## 2023-09-20 NOTE — Progress Notes (Signed)
[name] Counselor/Therapist Progress Note  Patient ID: [name]., MRN: [id number],    Date: 09/20/2023  Time Spent: 55 min Caregility video; Pt is @ home in private & Provider working remotely from [employer]. Pt is aware of the risks/limitations of telehealth & consents to Tx today.    Treatment Type: Individual Therapy  Reported Symptoms: Elevated anx/dep & stress due to health status changes. His marital issues persist.   Mental Status Exam: Appearance:  Casual     Behavior: Appropriate and Sharing  Motor: Normal  Speech/Language:  Clear and Coherent  Affect: Appropriate  Mood: normal  Thought process: normal  Thought content:   WNL  Sensory/Perceptual disturbances:   WNL  Orientation: oriented to person, place, time/date, and situation  Attention: Good  Concentration: Good  Memory: WNL  Fund of knowledge:  Good  Insight:   Good  Judgment:  Good  Impulse Control: Good   Risk Assessment: Danger to Self:  No Self-injurious Behavior: No Danger to Others: No Duty to Warn:no Physical Aggression / Violence:No  Access to Firearms a concern: No  Gang Involvement:No   Subjective: Pt has contacted a Pt Advocate to coordinate healthcare service needs for him in the home. He will be getting In-Home Care from an Aide once the particulars are worked out.   Pt has a long Hx of health status changes that are influencing his current needs. 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 Testing @ [facility] revealed this exposure along w/lead & some other components which Pt cannot detail specifically today. Pt sts this is the reason he is sensitive to medications currently.   Pt rec'd a COVID Infusion on Nov 16th, 2021 & began to hemorrhage 48 hrs later. This event has contributed to his fear of medication & his careful practices w/medication compliance/adherence.   Pt sts he is a  cheerful & positive person normally. His health status changes have caused his dep & he wants the pain to be treated so the dep will remit. He feels the medical establishment has labelled him in a psychologically stigmatizing way & he wants to resolve this.   Interventions: Family Systems  Diagnosis:Persistent depressive disorder  Somatization disorder  Anxiety  Plan: Annette Stable will take careful notes on his call w/the Pt Advocate for our next session. Further conversations about his medications will be directed to Prescribers @ this Clinician's instruction due to the limits on my scope of practice.   Target Date: 10/27/2023  Progress: 3  Frequency: Once every 2-3 wks  Modality: Claretta Fraise, LMFT

## 2023-09-23 ENCOUNTER — Other Ambulatory Visit: Payer: Self-pay | Admitting: Internal Medicine

## 2023-09-24 ENCOUNTER — Encounter: Payer: Self-pay | Admitting: Physical Medicine and Rehabilitation

## 2023-09-24 ENCOUNTER — Encounter: Payer: Self-pay | Admitting: Internal Medicine

## 2023-09-24 DIAGNOSIS — R1030 Lower abdominal pain, unspecified: Secondary | ICD-10-CM

## 2023-09-25 MED ORDER — GABAPENTIN 100 MG PO CAPS
ORAL_CAPSULE | ORAL | 3 refills | Status: DC
Start: 2023-09-25 — End: 2023-10-30

## 2023-09-25 MED ORDER — GABAPENTIN 100 MG PO CAPS: 200.0000 mg | ORAL_CAPSULE | Freq: Every day | ORAL | 1 refills | Status: DC

## 2023-09-25 NOTE — Addendum Note (Signed)
Addended by: Corwin Levins on: 09/25/2023 02:45 PM   Modules accepted: Orders

## 2023-10-01 ENCOUNTER — Encounter: Payer: Self-pay | Admitting: Physical Medicine and Rehabilitation

## 2023-10-01 DIAGNOSIS — M6289 Other specified disorders of muscle: Secondary | ICD-10-CM | POA: Diagnosis not present

## 2023-10-01 DIAGNOSIS — R1030 Lower abdominal pain, unspecified: Secondary | ICD-10-CM

## 2023-10-01 DIAGNOSIS — Z936 Other artificial openings of urinary tract status: Secondary | ICD-10-CM | POA: Diagnosis not present

## 2023-10-09 ENCOUNTER — Ambulatory Visit (INDEPENDENT_AMBULATORY_CARE_PROVIDER_SITE_OTHER): Payer: Medicare Other | Admitting: Internal Medicine

## 2023-10-09 ENCOUNTER — Encounter: Payer: Self-pay | Admitting: Internal Medicine

## 2023-10-09 VITALS — BP 124/72 | HR 90 | Temp 98.6°F | Ht 67.0 in | Wt 169.0 lb

## 2023-10-09 DIAGNOSIS — R1011 Right upper quadrant pain: Secondary | ICD-10-CM

## 2023-10-09 DIAGNOSIS — F419 Anxiety disorder, unspecified: Secondary | ICD-10-CM | POA: Diagnosis not present

## 2023-10-09 DIAGNOSIS — F341 Dysthymic disorder: Secondary | ICD-10-CM | POA: Diagnosis not present

## 2023-10-09 DIAGNOSIS — E7849 Other hyperlipidemia: Secondary | ICD-10-CM | POA: Diagnosis not present

## 2023-10-09 DIAGNOSIS — R269 Unspecified abnormalities of gait and mobility: Secondary | ICD-10-CM

## 2023-10-09 DIAGNOSIS — R413 Other amnesia: Secondary | ICD-10-CM | POA: Diagnosis not present

## 2023-10-09 DIAGNOSIS — R739 Hyperglycemia, unspecified: Secondary | ICD-10-CM | POA: Diagnosis not present

## 2023-10-09 DIAGNOSIS — R531 Weakness: Secondary | ICD-10-CM

## 2023-10-09 DIAGNOSIS — R1084 Generalized abdominal pain: Secondary | ICD-10-CM

## 2023-10-09 DIAGNOSIS — R109 Unspecified abdominal pain: Secondary | ICD-10-CM | POA: Insufficient documentation

## 2023-10-09 LAB — BASIC METABOLIC PANEL
BUN: 17 mg/dL (ref 6–23)
CO2: 25 meq/L (ref 19–32)
Calcium: 8.9 mg/dL (ref 8.4–10.5)
Chloride: 102 meq/L (ref 96–112)
Creatinine, Ser: 1.41 mg/dL (ref 0.40–1.50)
GFR: 48.38 mL/min — ABNORMAL LOW (ref >60.00)
Glucose, Bld: 108 mg/dL — ABNORMAL HIGH (ref 70–99)
Potassium: 4.4 meq/L (ref 3.5–5.1)
Sodium: 134 meq/L — ABNORMAL LOW (ref 135–145)

## 2023-10-09 LAB — CBC WITH DIFFERENTIAL/PLATELET
Basophils Absolute: 0 10*3/uL (ref 0.0–0.1)
Basophils Relative: 0.5 % (ref 0.0–3.0)
Eosinophils Absolute: 0.1 10*3/uL (ref 0.0–0.7)
Eosinophils Relative: 1.1 % (ref 0.0–5.0)
HCT: 47.7 % (ref 39.0–52.0)
Hemoglobin: 16.1 g/dL (ref 13.0–17.0)
Lymphocytes Relative: 27.6 % (ref 12.0–46.0)
Lymphs Abs: 2.3 10*3/uL (ref 0.7–4.0)
MCHC: 33.8 g/dL (ref 30.0–36.0)
MCV: 91.3 fL (ref 78.0–100.0)
Monocytes Absolute: 0.8 10*3/uL (ref 0.1–1.0)
Monocytes Relative: 9.1 % (ref 3.0–12.0)
Neutro Abs: 5.2 10*3/uL (ref 1.4–7.7)
Neutrophils Relative %: 61.7 % (ref 43.0–77.0)
Platelets: 323 10*3/uL (ref 150.0–400.0)
RBC: 5.23 Mil/uL (ref 4.22–5.81)
RDW: 13.4 % (ref 11.5–15.5)
WBC: 8.4 10*3/uL (ref 4.0–10.5)

## 2023-10-09 LAB — LIPID PANEL
Cholesterol: 222 mg/dL — ABNORMAL HIGH (ref 0–200)
HDL: 38.1 mg/dL — ABNORMAL LOW (ref >39.00)
LDL Cholesterol: 126 mg/dL — ABNORMAL HIGH (ref 0–99)
NonHDL: 183.82
Total CHOL/HDL Ratio: 6
Triglycerides: 287 mg/dL — ABNORMAL HIGH (ref 0.0–149.0)
VLDL: 57.4 mg/dL — ABNORMAL HIGH (ref 0.0–40.0)

## 2023-10-09 LAB — LIPASE: Lipase: 31 U/L (ref 11.0–59.0)

## 2023-10-09 LAB — TSH: TSH: 3.3 u[IU]/mL (ref 0.35–5.50)

## 2023-10-09 LAB — HEMOGLOBIN A1C: Hgb A1c MFr Bld: 6 % (ref 4.6–6.5)

## 2023-10-09 LAB — HEPATIC FUNCTION PANEL
ALT: 13 U/L (ref 0–53)
AST: 15 U/L (ref 0–37)
Albumin: 4.5 g/dL (ref 3.5–5.2)
Alkaline Phosphatase: 69 U/L (ref 39–117)
Bilirubin, Direct: 0 mg/dL (ref 0.0–0.3)
Total Bilirubin: 0.6 mg/dL (ref 0.2–1.2)
Total Protein: 7.3 g/dL (ref 6.0–8.3)

## 2023-10-09 NOTE — Progress Notes (Signed)
The test results show that your current treatment is OK, as the tests are stable.  Please continue the same plan.  There is no other need for change of treatment or further evaluation based on these results, at this time.  thanks 

## 2023-10-09 NOTE — Progress Notes (Signed)
Patient ID: Erik Cola Sr., male   DOB: [birth year], 77 y.o.   MRN: [id number]         Chief Complaint:: memory changes, anxiety, depression, generalized weakness, gait disorder, generalized abd pain, right flank pain, hld       HPI:  Erik Cola Sr. is a 77 y.o. male here with wife, overall doing ok but has concerns regarding worsening memory changes subjective in the setting of chronic anxiety depression that has been disabling; Pt denies chest pain, increased sob or doe, wheezing, orthopnea, PND, increased LE swelling, palpitations, dizziness or syncope, but has worsening generalized abd pain and right flank pain with hx bowel surgury and known right renal stone.  Denies worsening reflux, abd pain, dysphagia, n/v, or blood but has worsening diarrhea   Pt denies polydipsia, polyuria, or new focal neuro s/s.    Pt denies fever, wt loss, night sweats, loss of appetite, or other constitutional symptoms Also with worsening overall weakness and gait difficulty but no falls.  Note has long hx of medication trials where med prescribed seems to work well for a day or 2, then not working after that subjectively.     Wt Readings from Last 3 Encounters:  10/09/23 169 lb (76.7 kg)  09/16/23 172 lb 3.2 oz (78.1 kg)  09/09/23 170 lb (77.1 kg)   BP Readings from Last 3 Encounters:  10/09/23 124/72  09/16/23 130/70  09/09/23 (!) 145/80   Immunization History  Administered Date(s) Administered   Moderna Sars-Covid-2 Vaccination 01/11/2020, 02/08/2020   Pneumococcal Polysaccharide-23 07/03/2013   Pneumococcal-Unspecified 11/12/2012   Tdap 08/23/2014   There are no preventive care reminders to display for this patient.     Past Medical History:  Diagnosis Date   Allergic rhinitis    Allergy    Anal fissure    Anemia    Anxiety    Blood transfusion without reported diagnosis 12/06/2022   also 2 in the past   Cataract    starting   Coronary atherosclerosis of native coronary artery 2007    nonobstructive CAD by cath with 40% mid-LAD stenosis   Degenerative arthritis of knee, bilateral 10/09/2017   Depression    Diverticulosis 12/18/2011   Diverticulosis of colon with hemorrhage    April 2021, [facility]   Duodenal mass    Fibromyalgia    GERD with stricture    Helicobacter pylori (H. 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   Vitamin D deficiency 11/29/2017   Past Surgical History:  Procedure Laterality Date   ADENOIDECTOMY     bladder distention     x 6   CARDIAC CATHETERIZATION     CATHETER REMOVAL     super pubic area   COLONOSCOPY     CORONARY STENT INTERVENTION N/A 05/29/2022   Procedure: CORONARY STENT INTERVENTION;  Surgeon: Swaziland, Peter M, MD;  Location: [facility] INVASIVE CV LAB;  Service: Cardiovascular;  Laterality: N/A;   DENTAL SURGERY Right    #30 tooth extraction (right upper)   ESOPHAGOGASTRODUODENOSCOPY  12/17/2022   INTERSTIM IMPLANT PLACEMENT     INTERSTIM IMPLANT REMOVAL     KNEE ARTHROSCOPY     right x 2   LEFT HEART CATH AND CORONARY ANGIOGRAPHY N/A 05/29/2022   Procedure: LEFT HEART CATH AND CORONARY ANGIOGRAPHY;  Surgeon: Swaziland, Peter M, MD;  Location: [facility] INVASIVE CV LAB;  Service: Cardiovascular;  Laterality: N/A;   NISSEN FUNDOPLICATION  2009   PROSTECTOMY     ROBOT ASSISTED LAPAROSCOPIC COMPLETE CYSTECT ILEAL CONDUIT     SHOULDER ARTHROSCOPY  2010   left   TONSILLECTOMY AND ADENOIDECTOMY  1957   TOTAL KNEE ARTHROPLASTY  Right 09/08/2021   Procedure: TOTAL KNEE ARTHROPLASTY;  Surgeon: Jene Every, MD;  Location: [facility] ORS;  Service: Orthopedics;  Laterality: Right;   TRANSURETHRAL RESECTION OF PROSTATE     x 2   UPPER GASTROINTESTINAL ENDOSCOPY  05/13/2008   hiatal hernia   VASECTOMY      reports that he has quit smoking. He has been exposed to tobacco smoke. He has never used smokeless tobacco. He reports that he does not drink alcohol and does not use drugs. family history includes Allergic rhinitis in his mother; Arthritis in his mother; Breast cancer in his sister; Colon polyps in his father; Diabetes in his father, paternal uncle, and sister; Heart disease in his father and mother; Hypertension in his father and mother; Liver cancer in his maternal grandfather and paternal uncle; Stroke in his mother. Allergies  Allergen Reactions   Ambien [Zolpidem]     Unknown reaction   Bentyl [Dicyclomine]     Memory loss, anxiety, blurred vision   Carafate [Sucralfate] Nausea And Vomiting   Lansoprazole Diarrhea   Lexapro [Escitalopram Oxalate]     Unknown reaction    Other     Pt states he is sensitive to all oral medications   Pneumovax [Pneumococcal Polysaccharide Vaccine] Other (See Comments)    pain   Prilosec [Omeprazole] Other (See Comments)    Per patient joint pain with PPI's   Remeron [Mirtazapine]     Unknown reaction    Seroquel [Quetiapine] Other (See Comments)    Unknown reaction    Statins Other (See Comments)    Joint pain   Tizanidine Other (See Comments)    Made patient "feel crazy"   Tramadol Other (See Comments)    Pt can take small amounts up to twice daily but continued use causes chest / abdominal pain   Tylenol [Acetaminophen] Nausea And Vomiting    Can only take for 2 takes then he starts to have pain   Current Outpatient Medications on File Prior to Visit  Medication Sig Dispense Refill   diclofenac Sodium (VOLTAREN ARTHRITIS PAIN) 1 % GEL Apply 2 g topically 2 (two) times  daily as needed. Up to twice daily to right arm and low back     lidocaine-prilocaine (EMLA) cream Apply 1 Application topically as needed. Apply to lower abdomen for burning pain 30 g 0   LORazepam (ATIVAN) 0.5 MG tablet Take 1 tablet (0.5 mg total) by mouth 2 (two) times daily as needed for anxiety. 60 tablet 5   Menthol, Topical Analgesic, (BIOFREEZE EX) Apply topically.     omega-3 acid ethyl esters (LOVAZA) 1 g capsule Take 1,250 mg by mouth daily.     pantoprazole (PROTONIX) 20 MG tablet Take 20 mg by mouth daily.  tiZANidine (ZANAFLEX) 2 MG tablet Take 2 mg by mouth at bedtime as needed.     traMADol (ULTRAM) 50 MG tablet Take 1 tablet (50 mg total) by mouth every 6 (six) hours as needed. 120 tablet 1   diphenoxylate-atropine (LOMOTIL) 2.5-0.025 MG tablet Take by mouth 4 (four) times daily as needed for diarrhea or loose stools. (Patient not taking: Reported on 09/16/2023)     DULoxetine (CYMBALTA) 30 MG capsule Take 1 capsule (30 mg total) by mouth daily. (Patient not taking: Reported on 09/16/2023) 90 capsule 3   gabapentin (NEURONTIN) 100 MG capsule Take 1 capsule (100 mg total) by mouth at bedtime for 7 days, THEN 2 capsules (200 mg total) at bedtime for 23 days. (Patient not taking: Reported on 10/09/2023) 60 capsule 3   gabapentin (NEURONTIN) 100 MG capsule Take 2 capsules (200 mg total) by mouth at bedtime. (Patient not taking: Reported on 10/09/2023) 180 capsule 1   nitroGLYCERIN (NITROSTAT) 0.4 MG SL tablet Place 1 tablet (0.4 mg total) under the tongue every 5 (five) minutes as needed for chest pain. 90 tablet 3   triamcinolone cream (KENALOG) 0.1 % Apply 1 Application topically 2 (two) times daily. (Patient not taking: Reported on 09/16/2023) 30 g 0   No current facility-administered medications on file prior to visit.        ROS:  All others reviewed and negative.  Objective        PE:  BP 124/72 (BP Location: Right Arm, Patient Position: Sitting, Cuff Size: Normal)   Pulse  90   Temp 98.6 F (37 C) (Oral)   Ht 5\' 7"  (1.702 m)   Wt 169 lb (76.7 kg)   SpO2 98%   BMI 26.47 kg/m                 Constitutional: Pt appears in NAD               HENT: Head: NCAT.                Right Ear: External ear normal.                 Left Ear: External ear normal.                Eyes: . Pupils are equal, round, and reactive to light. Conjunctivae and EOM are normal               Nose: without d/c or deformity               Neck: Neck supple. Gross normal ROM               Cardiovascular: Normal rate and regular rhythm.                 Pulmonary/Chest: Effort normal and breath sounds without rales or wheezing.                Abd:  Soft, NT, ND, + BS, no organomegaly, no flank tender               Neurological: Pt is alert. At baseline orientation, motor grossly intact               Skin: Skin is warm. No rashes, no other new lesions, LE edema - none               Psychiatric: Pt behavior is normal without agitation  but anxious depressed  Micro: none  Cardiac  tracings I have personally interpreted today:  none  Pertinent Radiological findings (summarize): none   Lab Results  Component Value Date   WBC 8.4 10/09/2023   HGB 16.1 10/09/2023   HCT 47.7 10/09/2023   PLT 323.0 10/09/2023   GLUCOSE 108 (H) 10/09/2023   CHOL 222 (H) 10/09/2023   TRIG 287.0 (H) 10/09/2023   HDL 38.10 (L) 10/09/2023   LDLDIRECT 169.0 06/24/2018   LDLCALC 126 (H) 10/09/2023   ALT 13 10/09/2023   AST 15 10/09/2023   NA 134 (L) 10/09/2023   K 4.4 10/09/2023   CL 102 10/09/2023   CREATININE 1.41 10/09/2023   BUN 17 10/09/2023   CO2 25 10/09/2023   TSH 3.30 10/09/2023   PSA 0.00 (L) 11/28/2022   HGBA1C 6.0 10/09/2023   Assessment/Plan:  Erik Cola Sr. is a 77 y.o. White or Caucasian [1] male with  has a past medical history of Allergic rhinitis, Allergy, Anal fissure, Anemia, Anxiety, Blood transfusion without reported diagnosis (12/06/2022), Cataract, Coronary atherosclerosis  of native coronary artery (2007), Degenerative arthritis of knee, bilateral (10/09/2017), Depression, Diverticulosis (12/18/2011), Diverticulosis of colon with hemorrhage, Duodenal mass, Fibromyalgia, GERD with stricture, Helicobacter pylori (H. 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  Hyperlipidemia Lab Results  Component Value Date   LDLCALC 126 (H) 10/09/2023   Uncontrolled, pt to continue lovaza 1 gm bid, declines other med tx, for lower chol diet   Depression Chronic persistent and resistant - declines further change in tx at thsi time or referral counseling  Anxiety Chronic persistent and resistant, declines further tx at this time but ok for psychiatry referral  Generalized abdominal pain Exam benign,  to f/u any worsening symptoms or concerns, refer GI f/u as well  Right flank pain Afeb and non toxic appearing, for CT renal stone study, UA not helpful given mix with stool  Memory change Unclear etiology or degree - for neuropsychology referral  Generalized weakness Worrsening, for lab including cbc today  Gait disorder Also fro PT referral  Followup: Return in about 6 months (around 04/07/2024).  Oliver Barre, MD 10/12/2023 12:48 PM [facility]

## 2023-10-09 NOTE — Patient Instructions (Signed)
Please continue all other medications as before, and refills have been done if requested.  Please have the pharmacy call with any other refills you may need.  Please continue your efforts at being more active, low cholesterol diet, and weight control.  You are otherwise up to date with prevention measures today.  Please keep your appointments with your specialists as you may have planned  You will be contacted regarding the referral for: CT renal stone scan  You will be contacted regarding the referral for: Physical Therapy, Neuropsychology, Psychiatry, and Gastroenterology  Please go to the LAB at the blood drawing area for the tests to be done  You will be contacted by phone if any changes need to be made immediately.  Otherwise, you will receive a letter about your results with an explanation, but please check with MyChart first.  Please make an Appointment to return in 6 months, or sooner if needed

## 2023-10-12 ENCOUNTER — Encounter: Payer: Self-pay | Admitting: Internal Medicine

## 2023-10-12 NOTE — Assessment & Plan Note (Addendum)
Chronic persistent and resistant, declines further tx at this time but ok for psychiatry referral

## 2023-10-12 NOTE — Assessment & Plan Note (Signed)
Chronic persistent and resistant - declines further change in tx at thsi time or referral counseling

## 2023-10-12 NOTE — Assessment & Plan Note (Addendum)
Worrsening, for lab including cbc today

## 2023-10-12 NOTE — Assessment & Plan Note (Addendum)
Afeb and non toxic appearing, for CT renal stone study, UA not helpful given mix with stool

## 2023-10-12 NOTE — Assessment & Plan Note (Signed)
Also fro PT referral

## 2023-10-12 NOTE — Assessment & Plan Note (Addendum)
Lab Results  Component Value Date   LDLCALC 126 (H) 10/09/2023   Uncontrolled, pt to continue lovaza 1 gm bid, declines other med tx, for lower chol diet

## 2023-10-12 NOTE — Assessment & Plan Note (Signed)
Unclear etiology or degree - for neuropsychology referral

## 2023-10-12 NOTE — Assessment & Plan Note (Addendum)
Exam benign,  to f/u any worsening symptoms or concerns, refer GI f/u as well

## 2023-10-14 DIAGNOSIS — Z936 Other artificial openings of urinary tract status: Secondary | ICD-10-CM | POA: Diagnosis not present

## 2023-10-15 IMAGING — CR DG CHEST 2V
2 series · 2 of 2 positions shown · non-contrast
Comparison: Chest radiograph 06/30/2019

CLINICAL DATA: Chest pain.  Pain onset after a nerve block.

EXAM:
CHEST - 2 VIEW

[chest lat]
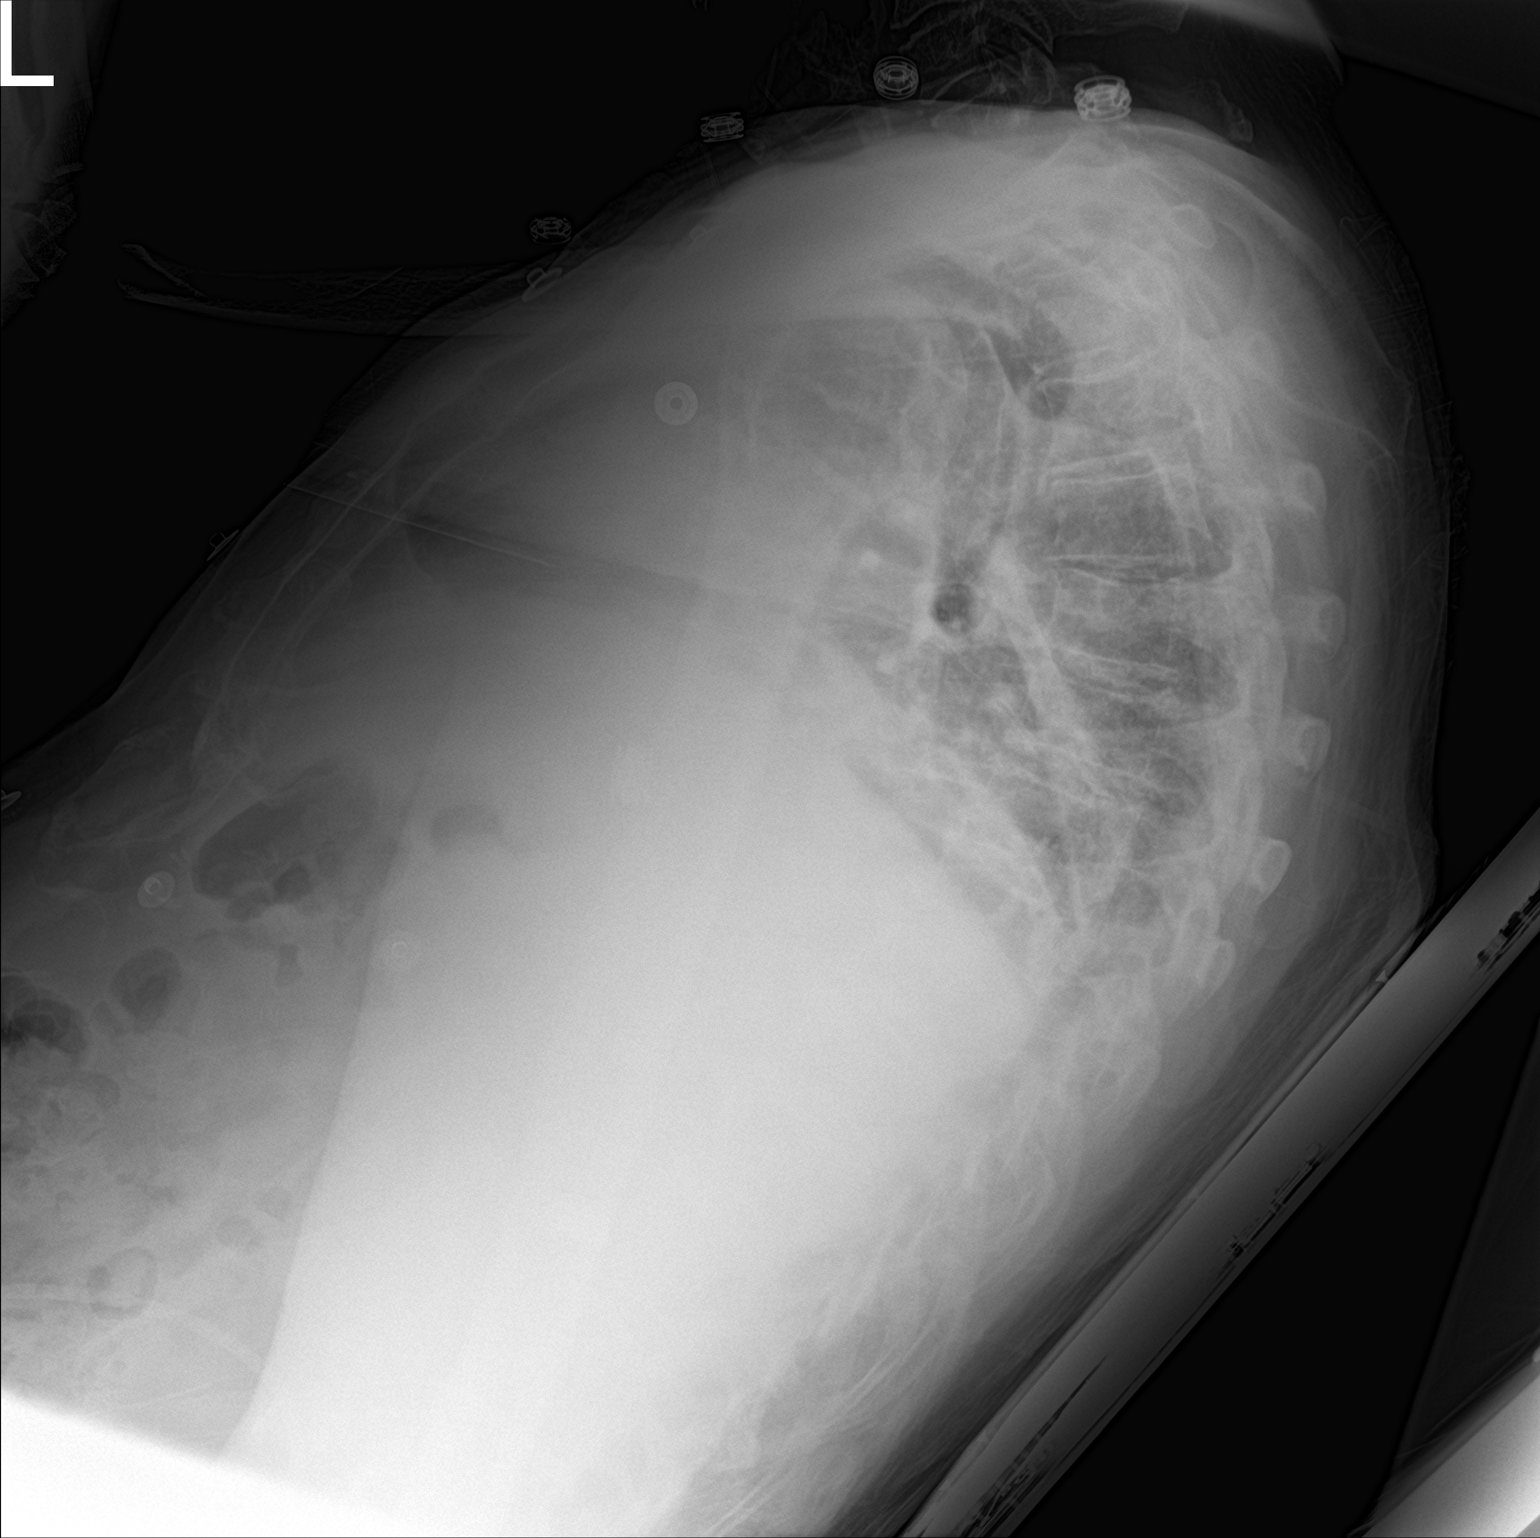

[chest ap]
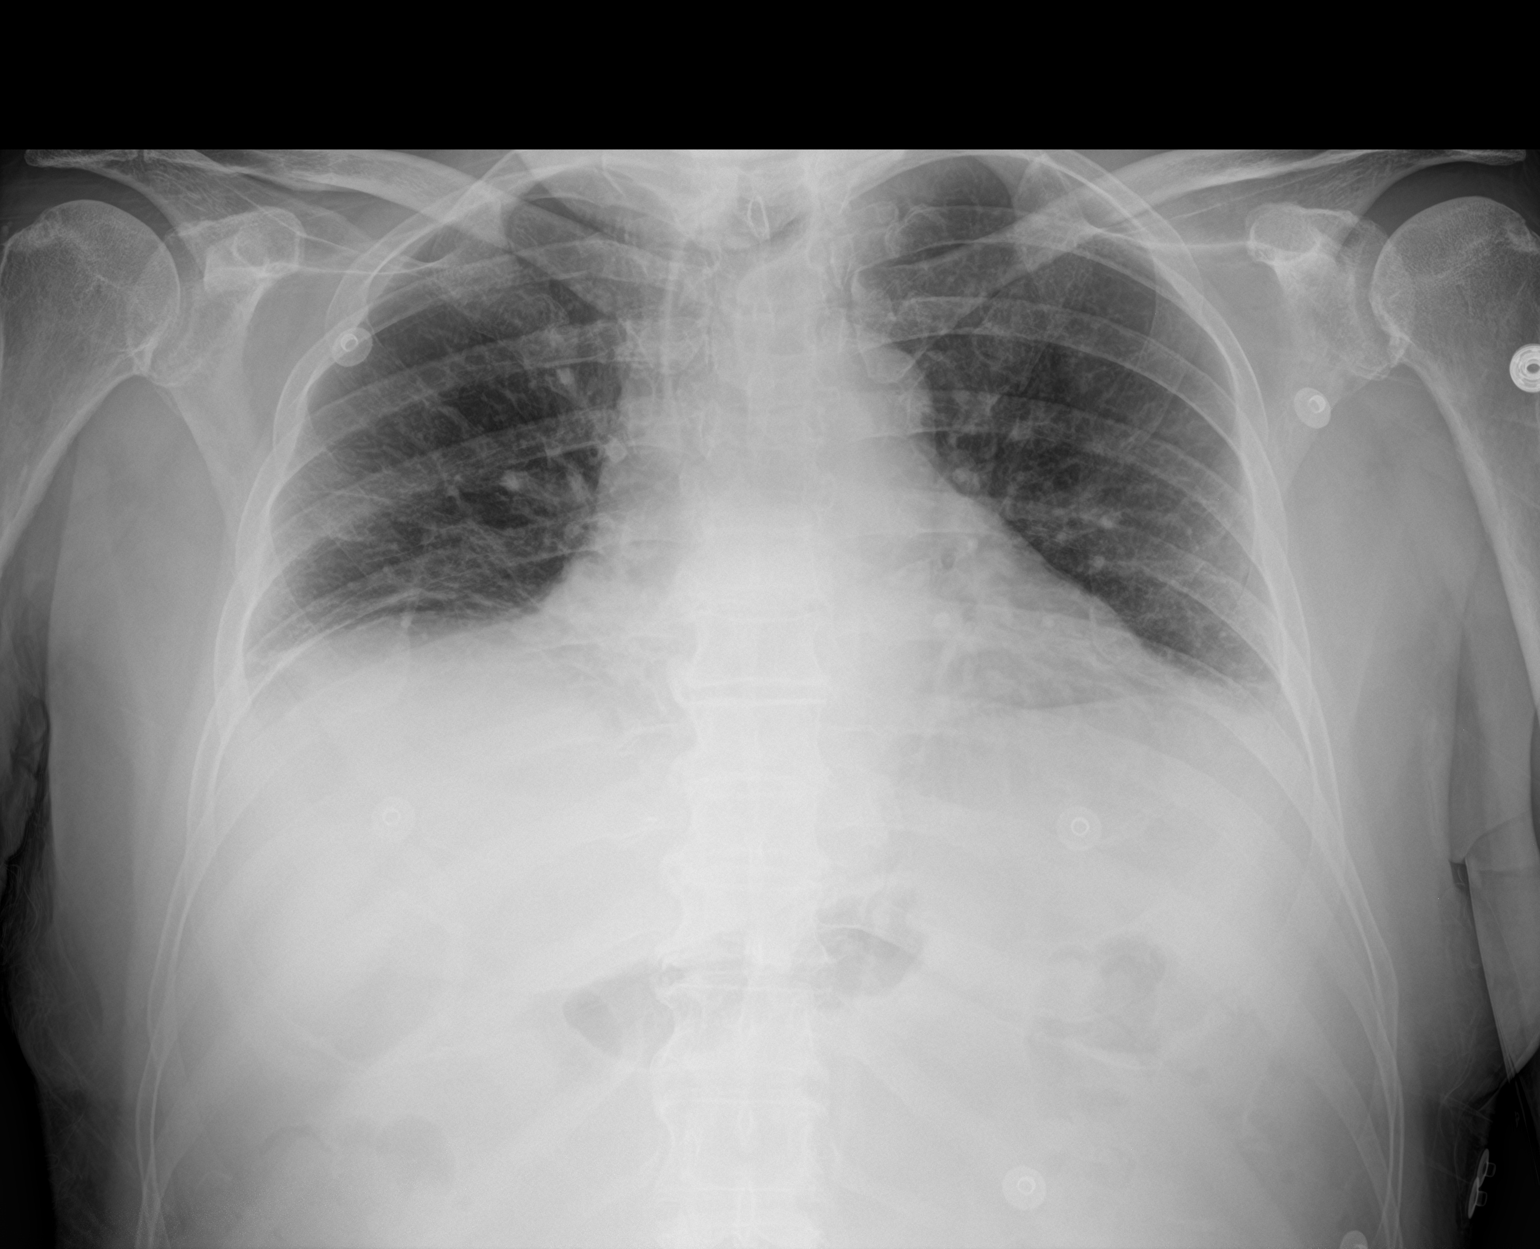

[2 of 2 positions shown; findings below may reference images not displayed]

FINDINGS: Very low lung volumes limit assessment. The heart is normal in size
for technique. Bibasilar atelectasis, right greater than left. No
pulmonary edema, pneumothorax, or large pleural effusion. Grossly
unremarkable osseous structures.
IMPRESSION: Very low lung volumes with bibasilar atelectasis, right greater than
left.

## 2023-10-16 ENCOUNTER — Other Ambulatory Visit: Payer: Self-pay | Admitting: Internal Medicine

## 2023-10-16 ENCOUNTER — Encounter: Payer: Self-pay | Admitting: Physical Medicine and Rehabilitation

## 2023-10-16 DIAGNOSIS — F419 Anxiety disorder, unspecified: Secondary | ICD-10-CM

## 2023-10-16 DIAGNOSIS — F341 Dysthymic disorder: Secondary | ICD-10-CM

## 2023-10-17 ENCOUNTER — Telehealth: Payer: Self-pay | Admitting: Internal Medicine

## 2023-10-17 ENCOUNTER — Ambulatory Visit (INDEPENDENT_AMBULATORY_CARE_PROVIDER_SITE_OTHER): Payer: Medicare Other | Admitting: Behavioral Health

## 2023-10-17 ENCOUNTER — Ambulatory Visit
Admission: RE | Admit: 2023-10-17 | Discharge: 2023-10-17 | Disposition: A | Discharge status: Home / Self Care | Payer: Medicare Other | Source: Ambulatory Visit | Attending: Internal Medicine | Admitting: Internal Medicine

## 2023-10-17 DIAGNOSIS — K5730 Diverticulosis of large intestine without perforation or abscess without bleeding: Secondary | ICD-10-CM | POA: Diagnosis not present

## 2023-10-17 DIAGNOSIS — F341 Dysthymic disorder: Secondary | ICD-10-CM

## 2023-10-17 DIAGNOSIS — F419 Anxiety disorder, unspecified: Secondary | ICD-10-CM | POA: Diagnosis not present

## 2023-10-17 DIAGNOSIS — R1011 Right upper quadrant pain: Secondary | ICD-10-CM

## 2023-10-17 DIAGNOSIS — K449 Diaphragmatic hernia without obstruction or gangrene: Secondary | ICD-10-CM | POA: Diagnosis not present

## 2023-10-17 DIAGNOSIS — N132 Hydronephrosis with renal and ureteral calculous obstruction: Secondary | ICD-10-CM | POA: Diagnosis not present

## 2023-10-17 DIAGNOSIS — R109 Unspecified abdominal pain: Secondary | ICD-10-CM | POA: Diagnosis not present

## 2023-10-17 DIAGNOSIS — F450 Somatization disorder: Secondary | ICD-10-CM

## 2023-10-17 NOTE — Progress Notes (Unsigned)
[facility] Counselor/Therapist Progress Note  Patient ID: Erik Popov., MRN: [id number],    Date: 10/17/2023  Time Spent: 55 min Caregility video; Pt is home in private & Provider working remotely from [employer]. Pt is aware of the risks/limitations of telehealth & consents to Tx today.  Time In: 3:00pm Time Out: 3:55pm   Treatment Type: Individual Therapy  Reported Symptoms: Elevated anx/dep & stress due to health status changes, complicated medical issues, some memory changes.   Mental Status Exam: Appearance:  Casual     Behavior: Appropriate and Sharing  Motor: Normal  Speech/Language:  Clear and Coherent  Affect: Appropriate  Mood: anxious  Thought process: normal  Thought content:   Rumination  Sensory/Perceptual disturbances:   WNL  Orientation: oriented to person, place, time/date, and situation  Attention: Good  Concentration: Good  Memory: Some repetition in the session  Fund of knowledge:  Good  Insight:   Fair  Judgment:  Good  Impulse Control: Good   Risk Assessment: Danger to Self:  No Self-injurious Behavior: No Danger to Others: No Duty to Warn:no Physical Aggression / Violence:No  Access to Firearms a concern: No  Gang Involvement:No   Subjective: ***   Interventions: {PSY:[phone]}  Diagnosis:No diagnosis found.  Plan: ***  Deneise Lever, LMFT

## 2023-10-17 NOTE — Progress Notes (Signed)
                Anastasya Jewell L Farryn Linares, LMFT 

## 2023-10-17 NOTE — Telephone Encounter (Signed)
Pt called stating his Urologist place him on a medication and wanting to know if there would be an conflict with his current medication. Please advise.

## 2023-10-18 NOTE — Telephone Encounter (Signed)
Yes there is - do not take lorazepam when taking the valium as this is similar and can lead to oversedation and falls

## 2023-10-21 NOTE — Telephone Encounter (Signed)
Called and let Pt know

## 2023-10-24 ENCOUNTER — Encounter: Payer: Self-pay | Admitting: Internal Medicine

## 2023-10-27 ENCOUNTER — Other Ambulatory Visit: Payer: Self-pay | Admitting: Internal Medicine

## 2023-10-27 DIAGNOSIS — N200 Calculus of kidney: Secondary | ICD-10-CM

## 2023-10-27 DIAGNOSIS — N1330 Unspecified hydronephrosis: Secondary | ICD-10-CM

## 2023-10-28 NOTE — Progress Notes (Signed)
Subjective:    Patient ID: Erik Cola Sr., male    DOB: [birth year], 77 y.o.   MRN: [id number]  HPI  Erik LIVING Sr. is a 77 y.o. year old male  who  has a past medical history of Allergic rhinitis, Allergy, Anal fissure, Anemia, Anxiety, Blood transfusion without reported diagnosis (12/06/2022), Cataract, Coronary atherosclerosis of native coronary artery (2007), Degenerative arthritis of knee, bilateral (10/09/2017), Depression, Diverticulosis (12/18/2011), Diverticulosis of colon with hemorrhage, Duodenal mass, Fibromyalgia, GERD with stricture, Helicobacter pylori (H. 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   They are presenting to PM&R clinic for follow up related to  chronic pain at right elbow/bicep, burning internal pain in L lower abdomen, and local left sided back pain without radiation.  .  Plan from last visit:  Chronic left-sided low back pain without sciatica Use voltaren gel over the counter on your right arm and low back up to 2 times daily   Start Lyrica as below; may consider butrans in the future given difficulty tolerating PO medications.    Follow up with me in 2 months    Pelvic floor dysfunction I have referred you to pelvic PT   Irritable bowel syndrome, unspecified type -     Ambulatory referral to Gastroenterology   I have referred you back to [facility] for chronic constipation, multiple stools, and abdominal pain. Patient states he has been seen prior but was lost to follow up several years ago.   Abdominal pain,  lower Stop gabapentin.   Use lidocaine cream for your lower abdominal burning pain.    Start Lyrica 25 mg at nighttime. After 1-2 weeks, message me through MyChart and if it is going well we will increase this to 2 capsules nightly. If unable to tolerate, we may stop and switch you to Butrans patch due to difficulty tolerating oral medications.   Unspecified injury of muscle, fascia and tendon of other parts of biceps, right arm, sequela   Voltaren gel as above   States he was prior scheduled for surgical repair in this area, unsure what. Surgery aborted d/t cardiovascular complications. Will re-address next visit.    Other orders -     Pregabalin; Take 1 capsule (25 mg total) by mouth at bedtime.  Dispense: 60 capsule; Refill: 0 -     Diclofenac Sodium; Apply 2 g topically 2 (two) times daily as needed. Up to twice daily to right arm and low back -     Lidocaine-Prilocaine; Apply 1 Application topically as needed. Apply to lower abdomen for burning pain  Dispense: 30 g; Refill: 0   Interval Hx:  - Therapies:  We are waiting for pelvic PT at 2 locations, both [facility] and [facility] have him scheduled in February but are looking a earlier openings.    - Follow ups: Dr. Logan Bores prescribed valium suppositories; caused him to "zonk" out, has reduced to every other night. "The nights I don't do it I don't sleep." On nights he does take it, he says he isnt sure if he is having side effects now.   Had a renal stone found on recent renal CT; [facility] for that. Per review, Mild right hydroureteronephrosis to a 3 mm stone  in the distal ureter near the pelvic inlet at the ureter's insertion on the ileal diversion.  Has appointment with [facility] January 6th.   His counselor referred him to a psychiatrist, as well as his PCP. He counsellor recommended [employer].    - Falls: none   - DME: none   - Medications: Lyrica stopped because it made him a "zombie".  Resumed gabapentin, was prescribed 100 mg and then he went back to 300 mg, then stopped when he was prescribed the valium.   With at bedtime valium suppositories, his abdominal pain is gone but he still has a gut "irritable" feeling.  "I'm seeing slowing of peristalsis which is causing heartburn" in general. He tried protonix to counteract this but thinks it gave him nightmare; unsure if this was protonix or melatonin.   Re: Butrans patch, patient is unsure what this recommendation was for.    - Other concerns:   Pain Inventory Average Pain 4 Pain Right Now 3 My pain is intermittent, burning, and dull  In the last 24 hours, has pain interfered with the following? General activity 4 Relation with others 4 Enjoyment of life 3 What TIME of day is your pain at its worst? varies Sleep (in general) Poor  Pain is worse with: bending and sitting Pain improves with: rest, heat/ice, and medication Relief from Meds: 10  Family History  Problem Relation Age of Onset   Allergic rhinitis Mother    Heart disease Mother    Hypertension Mother    Stroke Mother    Arthritis Mother    Colon polyps Father    Diabetes Father    Heart disease Father    Hypertension Father    Diabetes Sister    Breast cancer Sister    Diabetes Paternal Uncle    Liver cancer Paternal Uncle    Liver cancer Maternal Grandfather    Colon cancer Neg Hx    Esophageal cancer Neg Hx    Rectal cancer Neg Hx    Stomach cancer Neg Hx    Pancreatic cancer Neg Hx    Social History   Socioeconomic History   Marital status: Married    Spouse name: Not on file   Number of children: 4   Years of education: Not on file   Highest education level: Not on file  Occupational History   Occupation: retired  Tobacco Use   Smoking status: Former    Passive exposure: Past   Smokeless tobacco: Never   Tobacco comments:    quit 1980  Vaping Use   Vaping status: Never Used  Substance and Sexual Activity   Alcohol use: No     Alcohol/week: 0.0 standard drinks of alcohol   Drug use: No   Sexual activity: Yes    Partners: Female  Other Topics Concern   Not on file  Social History Narrative   Married, retired for children   He is a former smoker no alcohol or substance use   Social Drivers of Corporate investment banker Strain: Low Risk  (02/05/2023)   Overall Financial Resource Strain (CARDIA)    Difficulty of Paying Living Expenses: Not hard at all  Food Insecurity: No Food Insecurity (02/05/2023)   Hunger Vital Sign    Worried About Running Out of Food in the Last Year: Never true    Ran Out of Food in the Last Year: Never true  Transportation Needs: No Transportation Needs (02/05/2023)   PRAPARE - Transportation  Lack of Transportation (Medical): No    Lack of Transportation (Non-Medical): No  Physical Activity: Inactive (02/05/2023)   Exercise Vital Sign    Days of Exercise per Week: 0 days    Minutes of Exercise per Session: 0 min  Stress: Stress Concern Present (02/05/2023)   [employer] of Occupational Health - Occupational Stress Questionnaire    Feeling of Stress : Rather much  Social Connections: Moderately Integrated (02/05/2023)   Social Connection and Isolation Panel [NHANES]    Frequency of Communication with Friends and Family: Three times a week    Frequency of Social Gatherings with Friends and Family: Three times a week    Attends Religious Services: 1 to 4 times per year    Active Member of Clubs or Organizations: No    Attends Banker Meetings: Never    Marital Status: Married   Past Surgical History:  Procedure Laterality Date   ADENOIDECTOMY     bladder distention     x 6   CARDIAC CATHETERIZATION     CATHETER REMOVAL     super pubic area   COLONOSCOPY     CORONARY STENT INTERVENTION N/A 05/29/2022   Procedure: CORONARY STENT INTERVENTION;  Surgeon: Swaziland, Peter M, MD;  Location: [facility] INVASIVE CV LAB;  Service: Cardiovascular;  Laterality: N/A;    DENTAL SURGERY Right    #30 tooth extraction (right upper)   ESOPHAGOGASTRODUODENOSCOPY  12/17/2022   INTERSTIM IMPLANT PLACEMENT     INTERSTIM IMPLANT REMOVAL     KNEE ARTHROSCOPY     right x 2   LEFT HEART CATH AND CORONARY ANGIOGRAPHY N/A 05/29/2022   Procedure: LEFT HEART CATH AND CORONARY ANGIOGRAPHY;  Surgeon: Swaziland, Peter M, MD;  Location: [facility] INVASIVE CV LAB;  Service: Cardiovascular;  Laterality: N/A;   NISSEN FUNDOPLICATION  2009   PROSTECTOMY     ROBOT ASSISTED LAPAROSCOPIC COMPLETE CYSTECT ILEAL CONDUIT     SHOULDER ARTHROSCOPY  2010   left   TONSILLECTOMY AND ADENOIDECTOMY  1957   TOTAL KNEE ARTHROPLASTY Right 09/08/2021   Procedure: TOTAL KNEE ARTHROPLASTY;  Surgeon: Jene Every, MD;  Location: [facility];  Service: Orthopedics;  Laterality: Right;   TRANSURETHRAL RESECTION OF PROSTATE     x 2   UPPER GASTROINTESTINAL ENDOSCOPY  05/13/2008   hiatal hernia   VASECTOMY     Past Surgical History:  Procedure Laterality Date   ADENOIDECTOMY     bladder distention     x 6   CARDIAC CATHETERIZATION     CATHETER REMOVAL     super pubic area   COLONOSCOPY     CORONARY STENT INTERVENTION N/A 05/29/2022   Procedure: CORONARY STENT INTERVENTION;  Surgeon: Swaziland, Peter M, MD;  Location: [facility] INVASIVE CV LAB;  Service: Cardiovascular;  Laterality: N/A;   DENTAL SURGERY Right    #30 tooth extraction (right upper)   ESOPHAGOGASTRODUODENOSCOPY  12/17/2022   INTERSTIM IMPLANT PLACEMENT     INTERSTIM IMPLANT REMOVAL     KNEE ARTHROSCOPY     right x 2   LEFT HEART CATH AND CORONARY ANGIOGRAPHY N/A 05/29/2022   Procedure: LEFT HEART CATH AND CORONARY ANGIOGRAPHY;  Surgeon: Swaziland, Peter M, MD;  Location: [facility] INVASIVE CV LAB;  Service: Cardiovascular;  Laterality: N/A;   NISSEN FUNDOPLICATION  2009   PROSTECTOMY     ROBOT ASSISTED LAPAROSCOPIC COMPLETE CYSTECT ILEAL CONDUIT     SHOULDER ARTHROSCOPY  2010   left   TONSILLECTOMY AND ADENOIDECTOMY  1957   TOTAL KNEE  ARTHROPLASTY  Right 09/08/2021   Procedure: TOTAL KNEE ARTHROPLASTY;  Surgeon: Jene Every, MD;  Location: [facility];  Service: Orthopedics;  Laterality: Right;   TRANSURETHRAL RESECTION OF PROSTATE     x 2   UPPER GASTROINTESTINAL ENDOSCOPY  05/13/2008   hiatal hernia   VASECTOMY     Past Medical History:  Diagnosis Date   Allergic rhinitis    Allergy    Anal fissure    Anemia    Anxiety    Blood transfusion without reported diagnosis 12/06/2022   also 2 in the past   Cataract    starting   Coronary atherosclerosis of native coronary artery 2007   nonobstructive CAD by cath with 40% mid-LAD stenosis   Degenerative arthritis of knee, bilateral 10/09/2017   Depression    Diverticulosis 12/18/2011   Diverticulosis of colon with hemorrhage    [facility], [facility]   Duodenal mass    Fibromyalgia    GERD with stricture    Helicobacter pylori (H. 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   Vitamin D deficiency 11/29/2017   There were no vitals taken for this visit.  Opioid Risk Score:   Fall Risk Score:  `1  Depression screen [facility] 2/9     10/09/2023    8:29 AM 09/09/2023    1:10 PM 09/09/2023   12:45 PM 07/04/2023    2:48 PM 05/28/2023    1:24 PM 02/05/2023    9:37 PM 11/28/2022    4:02 PM  Depression screen PHQ 2/9  Decreased Interest 0 1 1 1  0 1 1  Down, Depressed, Hopeless 0 1 1 1 1 1 1   PHQ - 2 Score 0 2 2 2 1 2 2   Altered sleeping  2 2 3 2 1    Tired, decreased  energy  1 1 3 2 1    Change in appetite  1 1 1 2  0   Feeling bad or failure about yourself   0 0 0 1 0   Trouble concentrating  2 2 3 1 2    Moving slowly or fidgety/restless  2 2 3 1  0   Suicidal thoughts  0 1 3 2  0   PHQ-9 Score  10 11 18 12 6    Difficult doing work/chores   Somewhat difficult Very difficult Somewhat difficult      Review of Systems  Genitourinary:        Pelvic pain  Musculoskeletal:        Pain in the right knee, left knee, right arm  All other systems reviewed and are negative.      Objective:   Physical Exam   PE: Constitution: Appropriate appearance for age. No apparent distress Resp: No respiratory distress. No accessory muscle usage. on RA and CTAB Cardio: Well perfused appearance. No peripheral edema. Abdomen: Mildly distended, minimal bowel sounds. No TTP. + RLQ urostomy.  Psych: Appropriate mood  and affect. Neuro: AAOx4. No apparent cognitive deficits    Neurologic Exam: No significant changes since last visit. Motor exam: Antigravity against resistance in all 4 extremities. Coordination: Fine motor coordination was normal.     MSK back: No apparent deformity. Lumbar lordosis WNL. No apparent scoliosis. AROM complete.  Ongoing tenderness to palpation bilateral lumbar facets, paraspinals.  MSK R arm: Ongoing TTP anterior and posterior shoulder, biceps tendon.       Assessment & Plan:   Erik MANNOR Sr. is a 77 y.o. year old male  who  has a past medical history of Allergic rhinitis, Allergy, Anal fissure, Anemia, Anxiety, Blood transfusion without reported diagnosis (12/06/2022), Cataract, Coronary atherosclerosis of native coronary artery (2007), Degenerative arthritis of knee, bilateral (10/09/2017), Depression, Diverticulosis (12/18/2011), Diverticulosis of colon with hemorrhage, Duodenal mass, Fibromyalgia, GERD with stricture, Helicobacter pylori (H. 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   They are presenting to PM&R clinic for follow up related to chronic pain primarily in L lower abdomen and local left sided lumbar spine; ongoing acute LUE shoulder pain managed by orthopedics.  Pelvic floor dysfunction Irritable bowel syndrome, unspecified type We talked extensively about goals of pain control and agree on prioritizing optimizing your sleep and GI treatement at this time.  Continue management with Valium suppositories per urology, get GI workup with [facility], and continue PT for pelvic floor dysfunction.  Insomnia I agree with psychiatry referral per your PCP due to interaction of sleep, depression, and chronic pain cycles together.   Chronic left-sided low back pain without sciatica  Patient has failed extensive list of oral medications due to GI side effects and cognitive side effects, including gabapentin, Lyrica, Elavil, Cymbalta, Flexeril, Tylenol, and NSAIDs.  He now carries a formal letter from his urologist supporting use of only nonoral medications for his management.  This significantly limits our options.  We did discuss butrans patch but because of potential GI side effects and goals, we will hold off at this time.  Long-term, likely only options would be interventional, which patient does not wish to pursue at this time.   Follow up with me as needed for pain management goals, and of course contact me through mychart if you have any questions or concerns.

## 2023-10-30 ENCOUNTER — Encounter: Payer: Self-pay | Admitting: Physical Medicine and Rehabilitation

## 2023-10-30 ENCOUNTER — Encounter
Payer: Medicare Other | Attending: Physical Medicine and Rehabilitation | Admitting: Physical Medicine and Rehabilitation

## 2023-10-30 VITALS — BP 136/76 | HR 77 | Ht 67.0 in | Wt 172.0 lb

## 2023-10-30 DIAGNOSIS — K589 Irritable bowel syndrome without diarrhea: Secondary | ICD-10-CM | POA: Diagnosis not present

## 2023-10-30 DIAGNOSIS — M545 Low back pain, unspecified: Secondary | ICD-10-CM | POA: Diagnosis not present

## 2023-10-30 DIAGNOSIS — M6289 Other specified disorders of muscle: Secondary | ICD-10-CM | POA: Diagnosis not present

## 2023-10-30 DIAGNOSIS — G8929 Other chronic pain: Secondary | ICD-10-CM

## 2023-10-30 DIAGNOSIS — G4700 Insomnia, unspecified: Secondary | ICD-10-CM

## 2023-10-30 NOTE — Patient Instructions (Signed)
We talked extensively about goals of pain control and agree on prioritizing optimizing your sleep and GI treatement at this time. I agree with psychiatry referral per your PCP due to interaction of sleep, depression, and chronic pain cycles together.   We did discuss butrans patch but because of potential GI side effects and goals, we will hold off at this time.   Follow up with me as needed for pain management goals, and of course contact me through mychart if you have any questions or concerns.

## 2023-11-15 DIAGNOSIS — Z936 Other artificial openings of urinary tract status: Secondary | ICD-10-CM | POA: Diagnosis not present

## 2023-11-18 ENCOUNTER — Telehealth: Payer: Self-pay | Admitting: Internal Medicine

## 2023-11-18 DIAGNOSIS — N201 Calculus of ureter: Secondary | ICD-10-CM | POA: Diagnosis not present

## 2023-11-18 NOTE — Telephone Encounter (Signed)
 Copied from CRM [id number]. Topic: Referral - Status >> Nov 18, 2023  3:17 PM Samuel Jester B wrote: Reason for CRM: [place] Citizen called from [facility]  would like to request a callback regarding a referral for the [name] challenges, callback number is [phone]

## 2023-11-22 ENCOUNTER — Ambulatory Visit: Payer: Medicare Other | Admitting: Internal Medicine

## 2023-11-22 ENCOUNTER — Encounter: Payer: Self-pay | Admitting: Internal Medicine

## 2023-11-22 VITALS — BP 120/68 | HR 86 | Ht 67.0 in | Wt 175.0 lb

## 2023-11-22 DIAGNOSIS — R140 Abdominal distension (gaseous): Secondary | ICD-10-CM

## 2023-11-22 DIAGNOSIS — K219 Gastro-esophageal reflux disease without esophagitis: Secondary | ICD-10-CM | POA: Diagnosis not present

## 2023-11-22 DIAGNOSIS — K589 Irritable bowel syndrome without diarrhea: Secondary | ICD-10-CM

## 2023-11-22 DIAGNOSIS — Z9889 Other specified postprocedural states: Secondary | ICD-10-CM

## 2023-11-22 DIAGNOSIS — R197 Diarrhea, unspecified: Secondary | ICD-10-CM

## 2023-11-22 MED ORDER — IBGARD 90 MG PO CPCR: ORAL_CAPSULE | ORAL | Status: DC

## 2023-11-22 MED ORDER — LINACLOTIDE 72 MCG PO CAPS: 72.0000 ug | ORAL_CAPSULE | Freq: Every day | ORAL | 0 refills | Status: DC

## 2023-11-22 NOTE — Patient Instructions (Addendum)
 Try prune juice daily. If 1 cup is not effective, then go to two cups. If not effective, then try 2-3 kiwis per day.  If your blood pressure at your visit was 140/90 or greater, please contact your primary care physician to follow up  on this.  _______________________________________________________  If you are age 77 or older, your body mass index should be between 23-30. Your Body mass index is 27.41 kg/m. If this is out of the aforementioned range listed, please consider follow up with your Primary Care Provider.  If you are age 27 or younger, your body mass index should be between 19-25. Your Body mass index is 27.41 kg/m. If this is out of the aformentioned range listed, please consider follow up with your Primary Care Provider.   ________________________________________________________  The  GI providers would like to encourage you to use MYCHART to communicate with providers for non-urgent requests or questions.  Due to long hold times on the telephone, sending your provider a message by [facility] may be a faster and more efficient way to get a response.  Please allow 48 business hours for a response.  Please remember that this is for non-urgent requests.  _______________________________________________________  Thank you for entrusting me with your care and for choosing [facility], Dr. Estefana Kidney

## 2023-11-22 NOTE — Progress Notes (Signed)
 Chief Complaint: Abdominal pain  HPI : 78 year old male with history of CAD, anal fissure, fibromyalgia, osteoporosis, prior H pylori infection, rheumatoid arthritis, IBS, kidney stones, GERD s/p Nissen fundoplication in 2013, interstitial cystitis, and pelvic floor dysfunction presents for follow-up of abdominal pain  Patient just took the earliest GI appt that was available. He follows with Dr. Avram but was not able to be seen earlier with him. His GI symptoms started 3 months ago. He has reflux, diarrhea, and abdominal pain.  Patient brings a letter from Dr. Lamar Sar that states that he is not able to take medication by mouth and has not been able to do for the last 34 years. He is not able to take oral solutions. He has recently been taking rectal Valium , though he has not been tolerating this well. He is being weaned off of Valium  currently. He has burning pain in his suprapubic area with an urgent need to have a stool. He has had bleeding from his GI tract in the past but he states that he has never been told what is causing this bleeding. He is having acid reflux that is very severe while he is on Valium . Prior to taking Valium , his reflux was not severe. When he takes Tums for two days, he will get diarrhea. He can take Protonix  for 1 day and get extreme diarrhea and irritation. He can't stay on famotidine . Maalox causes him to vomit. He has diarrhea issues. Diarrhea is so bad that he feels very raw on the inside. He is having 4-7 BMs per day. Endorses nocturnal stools. He cannot take Pepto Bismol or Lomotil . Imodium works for 2-3 days but then he will start to get sick. He has been diagnosed with pelvic floor dysfunction and follows with pelvic floor physical therapist. Gluten free diet for 3-4 months did not help. Bentyl  did not help.    Past Medical History:  Diagnosis Date   Allergic rhinitis    Allergy     Anal fissure    Anemia    Anxiety    Blood transfusion without reported  diagnosis 12/06/2022   also 2 in the past   Cataract    starting   Coronary atherosclerosis of native coronary artery 2007   nonobstructive CAD by cath with 40% mid-LAD stenosis   Degenerative arthritis of knee, bilateral 10/09/2017   Depression    Diverticulosis 12/18/2011   Diverticulosis of colon with hemorrhage    April 2021, [facility]   Duodenal mass    Fibromyalgia    GERD with stricture    Helicobacter pylori (H. 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   Vitamin D  deficiency 11/29/2017     Past Surgical History:  Procedure Laterality Date   ADENOIDECTOMY     bladder distention     x 6   CARDIAC CATHETERIZATION  CATHETER REMOVAL     super pubic area   COLONOSCOPY     CORONARY STENT INTERVENTION N/A 05/29/2022   Procedure: CORONARY STENT INTERVENTION;  Surgeon: Jordan, Peter M, MD;  Location: [facility] INVASIVE CV LAB;  Service: Cardiovascular;  Laterality: N/A;   DENTAL SURGERY Right    #30 tooth extraction (right upper)   ESOPHAGOGASTRODUODENOSCOPY  12/17/2022   INTERSTIM IMPLANT PLACEMENT     INTERSTIM IMPLANT REMOVAL     KNEE ARTHROSCOPY     right x 2   LEFT HEART CATH AND CORONARY ANGIOGRAPHY N/A 05/29/2022   Procedure: LEFT HEART CATH AND CORONARY ANGIOGRAPHY;  Surgeon: Jordan, Peter M, MD;  Location: [facility] INVASIVE CV LAB;  Service: Cardiovascular;  Laterality: N/A;   NISSEN FUNDOPLICATION  2009   PROSTECTOMY     ROBOT ASSISTED  LAPAROSCOPIC COMPLETE CYSTECT ILEAL CONDUIT     SHOULDER ARTHROSCOPY  2010   left   TONSILLECTOMY AND ADENOIDECTOMY  1957   TOTAL KNEE ARTHROPLASTY Right 09/08/2021   Procedure: TOTAL KNEE ARTHROPLASTY;  Surgeon: Duwayne Purchase, MD;  Location: [facility];  Service: Orthopedics;  Laterality: Right;   TRANSURETHRAL RESECTION OF PROSTATE     x 2   UPPER GASTROINTESTINAL ENDOSCOPY  05/13/2008   hiatal hernia   VASECTOMY     Family History  Problem Relation Age of Onset   Allergic rhinitis Mother    Heart disease Mother    Hypertension Mother    Stroke Mother    Arthritis Mother    Colon polyps Father    Diabetes Father    Heart disease Father    Hypertension Father    Diabetes Sister    Breast cancer Sister    Liver cancer Maternal Grandfather    Diabetes Paternal Uncle    Liver cancer Paternal Uncle    Colon cancer Neg Hx    Esophageal cancer Neg Hx    Social History   Tobacco Use   Smoking status: Former    Passive exposure: Past   Smokeless tobacco: Never   Tobacco comments:    quit 1980  Vaping Use   Vaping status: Never Used  Substance Use Topics   Alcohol  use: No    Alcohol /week: 0.0 standard drinks of alcohol    Drug use: No   Current Outpatient Medications  Medication Sig Dispense Refill   diclofenac  Sodium (VOLTAREN  ARTHRITIS PAIN) 1 % GEL Apply 2 g topically 2 (two) times daily as needed. Up to twice daily to right arm and low back     diphenoxylate -atropine  (LOMOTIL ) 2.5-0.025 MG tablet Take by mouth 4 (four) times daily as needed for diarrhea or loose stools.     LORazepam  (ATIVAN ) 0.5 MG tablet Take 1 tablet (0.5 mg total) by mouth 2 (two) times daily as needed for anxiety. 60 tablet 5   pantoprazole  (PROTONIX ) 20 MG tablet Take 20 mg by mouth daily.     traMADol  (ULTRAM ) 50 MG tablet Take 1 tablet (50 mg total) by mouth every 6 (six) hours as needed. 120 tablet 1   lidocaine -prilocaine  (EMLA ) cream Apply 1 Application topically as needed. Apply to lower  abdomen for burning pain (Patient not taking: Reported on 11/22/2023) 30 g 0   Menthol , Topical Analgesic, (BIOFREEZE EX) Apply topically. (Patient not taking: Reported on 11/22/2023)     nitroGLYCERIN  (NITROSTAT ) 0.4 MG SL tablet Place 1 tablet (0.4 mg total) under the tongue every 5 (five) minutes as needed for chest pain. 90 tablet 3   omega-3 acid ethyl esters (LOVAZA)  1 g capsule Take 1,250 mg by mouth daily. (Patient not taking: Reported on 11/22/2023)     triamcinolone  cream (KENALOG ) 0.1 % Apply 1 Application topically 2 (two) times daily. (Patient not taking: Reported on 11/22/2023) 30 g 0   No current facility-administered medications for this visit.   Allergies  Allergen Reactions   Ambien  [Zolpidem ]     Unknown reaction   Bentyl  [Dicyclomine ]     Memory loss, anxiety, blurred vision   Carafate  [Sucralfate ] Nausea And Vomiting   Lansoprazole  Diarrhea   Lexapro  [Escitalopram  Oxalate]     Unknown reaction    Other     Pt states he is sensitive to all oral medications   Pneumovax [Pneumococcal Polysaccharide Vaccine] Other (See Comments)    pain   Prilosec [Omeprazole ] Other (See Comments)    Per patient joint pain with PPI's   Remeron  [Mirtazapine ]     Unknown reaction    Seroquel  [Quetiapine ] Other (See Comments)    Unknown reaction    Statins Other (See Comments)    Joint pain   Tizanidine  Other (See Comments)    Made patient feel crazy   Tramadol  Other (See Comments)    Pt can take small amounts up to twice daily but continued use causes chest / abdominal pain   Tylenol  [Acetaminophen ] Nausea And Vomiting    Can only take for 2 takes then he starts to have pain    Physical Exam: BP 120/68 (Cuff Size: Large)   Pulse 86   Ht 5' 7 (1.702 m)   Wt 175 lb (79.4 kg)   BMI 27.41 kg/m  Constitutional: Pleasant,well-developed, male in no acute distress. HEENT: Normocephalic and atraumatic. Conjunctivae are normal. No scleral icterus. Cardiovascular: Normal rate,  regular rhythm.  Pulmonary/chest: Effort normal and breath sounds normal. No wheezing, rales or rhonchi. Abdominal: Soft, nondistended, nontender. Bowel sounds active throughout. There are no masses palpable. No hepatomegaly. Extremities: No edema Neurological: Alert and oriented to person place and time. Skin: Skin is warm and dry. No rashes noted. Psychiatric: Normal mood and affect. Behavior is normal.  Labs 2023: Pancreatic elastase nml. Fecal calprotectin nml.   Labs 12/2022: H pylori stool antigen negative.  Labs 09/2023: CBC normal. TSH normal.  Lipase normal.  LFTs normal.  BMP with mildly low sodium of 134.  CT A/P w/o contrast 12/18/21: IMPRESSION: There is no evidence of intestinal obstruction or pneumoperitoneum. Appendix is not dilated.   There is 1.2 cm soft tissue density in the right lateral margin of the first portion of duodenum. This may be an artifact due to incomplete distention or suggest a polyp or some other neoplastic process.   There is wall thickening in the distal rectum which may be due to incomplete distention or neoplastic process in the wall.   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 There is no hydronephrosis. There is para-ostial hernia containing short segment of small bowel loop without evidence of obstruction.   Possible 4 mm right renal stone. Multiple bilateral renal cysts. Diverticulosis of colon without signs of focal acute diverticulitis.   Other findings as described in the body of the report.  CT A/P w/o contrast 10/17/23: IMPRESSION: 1. Mild right hydroureteronephrosis to a 3 mm stone in the distal ureter near the pelvic inlet at the ureter's insertion on the ileal diversion. 2. Nonobstructive 3 mm right renal stone. 3. Prior cystectomy with ileal conduit diversion surgery with right anterior abdominal wall urostomy. Peristomal hernia contains fat  and nonobstructed portions of bowel. 4.  Colonic diverticulosis without findings of acute diverticulitis. 5. Small hiatal hernia.  Colonoscopy 01/24/12: ENDOSCOPIC IMPRESSION: 1) Mild diverticulosis in the sigmoid colon 2) Internal hemorrhoids 3) Otherwise normal examination into distal terminal ileum - random colon biopsies taken  Path: Surgical [P], random colon, bx - BENIGN COLONIC MUCOSA. - NO MICROSCOPIC COLITIS, ACTIVE INFLAMMATION, GRANULOMAS OR ADENOMATOUS CHANGES IDENTIFIED.  Colonoscopy 11/23/15: ENDOSCOPIC IMPRESSION: 1. Mild diverticulosis was noted in the sigmoid colon 2. The examination was otherwise normal  Colonoscopy 09/04/22: Impression:            - No further signs of GI bleeding. The examined portion of the ileum was normal. The examination was otherwise normal on direct and retroflexion views.                         - Non-bleeding internal hemorrhoids.                         - Diverticulosis in the left colon.                         - No specimens collected.   EGD 12/17/22: - Chronic gastritis. Mild as before. - A Nissen fundoplication was found, characterized by healthy appearing mucosa. - The examination was otherwise normal. - Biopsies were taken with a cold forceps for evaluation of celiac disease. Path: Surgical [P], duodenal bulb, 2nd portion of duodenum, and distal duodenum BENIGN DUODENAL MUCOSA WITH NO DIAGNOSTIC ABNORMALITY  ASSESSMENT AND PLAN: GERD Diarrhea, possibly overflow Bloating Suprapubic abdominal discomfort Patient has numerous GI symptoms including uncontrolled GERD despite prior Nissen fundoplication, diarrhea, bloating, and suprapubic abdominal discomfort. He is unable to tolerate oral medications because he usually develops significant side effects.  Review of his prior CT scan last month does show that he has stool throughout his colon, suggesting that he may be having overflow diarrhea.  For his acid reflux, I gave him conservative recommendations on how to help with acid  reflux by avoiding spicy, fatty, tomato-based, citrus, alcohol , and caffeine.  I also suggested that he try to follow the low FODMAP diet to see if this helps with his diarrhea symptoms. If the low FODMAP diet and natural constipation treatments (prunes and kiwis) are not effective, then can try IBgard or Linzess  in the future.  Patient has some suprapubic abdominal discomfort but because he had an ileal conduit surgery performed previously, he should no longer have symptoms of interstitial cystitis.  I do wonder if his suprapubic abdominal discomfort may be due to underlying constipation since patient does also have history of pelvic floor dysfunction.  Patient may also be a good candidate for the in foods IBS food sensitivity test. He will read about the test and decide if he wishes to pursue this in the future - GERD handout - Low FODMAP diet - Would try prune juice daily. If 1 cup is not effective, then go to two cups. If not effective, then try 2-3 kiwis per day. - Will give some samples of IBGard. If not effective, then try Linzess  72 mcg every day. Gave patient samples of Linzess  - Patient will decide whether or not he wants to do the inFoods IBS food sensitivity test.  - RTC in 2 months with Dr. Avram or APP  Estefana Kidney, MD  I spent 45 minutes of time, including in depth chart review, independent  review of results as outlined above, communicating results with the patient directly, face-to-face time with the patient, coordinating care, ordering studies and medications as appropriate, and documentation.

## 2023-11-27 ENCOUNTER — Encounter: Payer: Self-pay | Admitting: Internal Medicine

## 2023-12-03 ENCOUNTER — Ambulatory Visit: Payer: Medicare Other | Admitting: Behavioral Health

## 2023-12-04 DIAGNOSIS — K5730 Diverticulosis of large intestine without perforation or abscess without bleeding: Secondary | ICD-10-CM | POA: Diagnosis not present

## 2023-12-04 DIAGNOSIS — K449 Diaphragmatic hernia without obstruction or gangrene: Secondary | ICD-10-CM | POA: Diagnosis not present

## 2023-12-04 DIAGNOSIS — N201 Calculus of ureter: Secondary | ICD-10-CM | POA: Diagnosis not present

## 2023-12-05 ENCOUNTER — Telehealth: Payer: Self-pay | Admitting: Internal Medicine

## 2023-12-05 ENCOUNTER — Ambulatory Visit: Payer: Self-pay | Admitting: Internal Medicine

## 2023-12-05 NOTE — Telephone Encounter (Signed)
Patient has an appointment for tomorrow but states he is in a lot of pain.  Triage nurse called and wants someone to call him today before 5.  Please call at [phone]

## 2023-12-05 NOTE — Telephone Encounter (Signed)
Chief Complaint: "pelvic floor pain" Symptoms: pain, "uneasy stomach", diarrhea,  Frequency: chronic, worsening this morning, but now resolved Pertinent Negatives: Patient denies weakness or numbness, CP, SOB Disposition: [] ED /[] Urgent Care (no appt availability in office) / [x] Appointment(In office/virtual)/ []  [facility]/ [] Home Care/ [] Refused Recommended Disposition /[]  Mobile Bus/ []  Follow-up with PCP Additional Notes: Patient called stating he is having pelvic floor pain, "uneasy stomach", diarrhea (one episode)- reports that this is a chronic condition that worsened this morning. States that he is prescribed rectal diazepam by Dr. Su Hilt, urology, and it is no longer controlling his symptoms. This RN asked patient if he reached out to prescribing provider, patient states "it takes 9 months to see him". Per protocol, patient to be evaluated within 24 hours. Scheduled with PCP for tomorrow at 1540. Care advice reviewed, patient verbalized understanding. At conclusion of call patient stated "if I have to go to the ER I will kill myself". 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 Denies SI/HI/AVH. This RN contacted [facility], spoke with Maralyn Sago, who stated all nurses were with patients at this time, that a nurse will call patient back for follow up today. Alerting PCP for review and follow up.   Copied from CRM [id number]. Topic: Clinical - Red Word Triage >> Dec 05, 2023  2:59 PM Irine Seal wrote: [facility] that prompted transfer to Nurse Triage: diazepam, side effects: extreme pain in pelvic floor 9 or 10, nightmares, pt experienced stroke symptoms this morning. Reason for Disposition  [1] MODERATE pain (e.g., interferes with normal activities) AND [2] pain comes and goes (cramps) AND [3] present > 24 hours  (Exception: Pain with  Vomiting or Diarrhea - see that Guideline.)  Answer Assessment - Initial Assessment Questions 1. LOCATION: "Where does it hurt?"      Lower abdomen, "pelvic floor", rectal area 2. RADIATION: "Does the pain shoot anywhere else?" (e.g., chest, back)     Denies 3. ONSET: "When did the pain begin?" (Minutes, hours or days ago)      4 days 4. SUDDEN: "Gradual or sudden onset?"     Gradual 5. PATTERN "Does the pain come and go, or is it constant?"    - If it comes and goes: "How long does it last?" "Do you have pain now?"     (Note: Comes and goes means the pain is intermittent. It goes away completely between bouts.)    - If constant: "Is it getting better, staying the same, or getting worse?"      (Note: Constant means the pain never goes away completely; most serious pain is constant and gets worse.)      Intermittent, no factors that cause it that he can identify, reports pain is sharp 6. SEVERITY: "How bad is the pain?"  (e.g., Scale 1-10; mild, moderate, or severe)    - MILD (1-3): Doesn't interfere with normal activities, abdomen soft and not tender to touch.     - MODERATE (4-7): Interferes with normal activities or awakens from sleep, abdomen tender to touch.     - SEVERE (8-10): Excruciating pain, doubled over, unable to do any normal activities.       Denies 7. RECURRENT SYMPTOM: "Have you ever had this type of stomach pain before?" If Yes, ask: "When was the last  time?" and "What happened that time?"      Off and on for years, states that he is taking diazepam for this pain but feels it is no longer working. 8. CAUSE: "What do you think is causing the stomach pain?"     Pelvic floor spasms 9. RELIEVING/AGGRAVATING FACTORS: "What makes it better or worse?" (e.g., antacids, bending or twisting motion, bowel movement)     Denies 10. OTHER SYMPTOMS: "Do you have any other symptoms?" (e.g., back pain, diarrhea, fever, urination pain, vomiting)       1 episode of diarrhea  Protocols  used: Abdominal Pain - Male-A-AH

## 2023-12-06 ENCOUNTER — Encounter: Payer: Self-pay | Admitting: Internal Medicine

## 2023-12-06 ENCOUNTER — Ambulatory Visit (INDEPENDENT_AMBULATORY_CARE_PROVIDER_SITE_OTHER): Payer: Medicare Other | Admitting: Internal Medicine

## 2023-12-06 VITALS — BP 122/76 | HR 80 | Temp 98.2°F | Ht 67.0 in | Wt 176.0 lb

## 2023-12-06 DIAGNOSIS — F419 Anxiety disorder, unspecified: Secondary | ICD-10-CM

## 2023-12-06 DIAGNOSIS — E559 Vitamin D deficiency, unspecified: Secondary | ICD-10-CM

## 2023-12-06 DIAGNOSIS — M6289 Other specified disorders of muscle: Secondary | ICD-10-CM | POA: Diagnosis not present

## 2023-12-06 DIAGNOSIS — K219 Gastro-esophageal reflux disease without esophagitis: Secondary | ICD-10-CM

## 2023-12-06 DIAGNOSIS — K222 Esophageal obstruction: Secondary | ICD-10-CM

## 2023-12-06 MED ORDER — TRAMADOL HCL 50 MG PO TABS: 50.0000 mg | ORAL_TABLET | Freq: Four times a day (QID) | ORAL | 1 refills | Status: DC | PRN

## 2023-12-06 NOTE — Telephone Encounter (Signed)
Pt seen in office today.

## 2023-12-06 NOTE — Patient Instructions (Signed)
Ok to start with one week of tramadol, then rotate weekly with diazepam suppository and gabapentin  Please also see Dr Logan Bores for possible Trigger Point Injections  Please continue all other medications as before, and refills have been done if requested.  Please have the pharmacy call with any other refills you may need.  Please keep your appointments with your specialists as you may have planned

## 2023-12-06 NOTE — Progress Notes (Unsigned)
Patient ID: Erik Cola Sr., male   DOB: [birth year], 78 y.o.   MRN: [id number]        Chief Complaint: follow up pelvic floor dysfunction, gerd, anxiety, low vit d       HPI:  Erik GENDRON Sr. is a 78 y.o. male here with wife, has persistent pain with pelvic floor, and as for many years with other issues his treatment that seems to help always seems to stop working soon after; including the tramadol prn, diazepam supp, and gabapentin.  Pt denies chest pain, increased sob or doe, wheezing, orthopnea, PND, increased LE swelling, palpitations, dizziness or syncope.   Pt denies polydipsia, polyuria, or new focal neuro s/s.    Pt denies fever, wt loss, night sweats, loss of appetite, Denies worsening depressive symptoms, suicidal ideation, or panic.         Wt Readings from Last 3 Encounters:  12/06/23 176 lb (79.8 kg)  11/22/23 175 lb (79.4 kg)  10/30/23 172 lb (78 kg)   BP Readings from Last 3 Encounters:  12/06/23 122/76  11/22/23 120/68  10/30/23 136/76         Past Medical History:  Diagnosis Date   Allergic rhinitis    Allergy    Anal fissure    Anemia    Anxiety    Blood transfusion without reported diagnosis 12/06/2022   also 2 in the past   Cataract    starting   Coronary atherosclerosis of native coronary artery 2007   nonobstructive CAD by cath with 40% mid-LAD stenosis   Degenerative arthritis of knee, bilateral 10/09/2017   Depression    Diverticulosis 12/18/2011   Diverticulosis of colon with hemorrhage    April 2021, [facility]   Duodenal mass    Fibromyalgia    GERD with stricture    Helicobacter pylori (H. 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   Vitamin D deficiency 11/29/2017   Past Surgical History:  Procedure Laterality Date   ADENOIDECTOMY     bladder distention     x 6   CARDIAC CATHETERIZATION     CATHETER REMOVAL     super pubic area   COLONOSCOPY     CORONARY STENT INTERVENTION N/A 05/29/2022   Procedure: CORONARY STENT INTERVENTION;  Surgeon: Swaziland, Peter M, MD;  Location: [facility] INVASIVE CV LAB;  Service: Cardiovascular;  Laterality: N/A;   DENTAL SURGERY Right    #30 tooth extraction (right upper)   ESOPHAGOGASTRODUODENOSCOPY  12/17/2022   INTERSTIM IMPLANT PLACEMENT     INTERSTIM IMPLANT REMOVAL     KNEE ARTHROSCOPY     right x 2   LEFT HEART CATH AND CORONARY ANGIOGRAPHY N/A 05/29/2022   Procedure: LEFT HEART CATH AND CORONARY ANGIOGRAPHY;  Surgeon: Swaziland, Peter M, MD;  Location: [facility] INVASIVE CV LAB;  Service: Cardiovascular;  Laterality: N/A;   NISSEN FUNDOPLICATION  2009   PROSTECTOMY     ROBOT ASSISTED LAPAROSCOPIC COMPLETE CYSTECT ILEAL CONDUIT     SHOULDER ARTHROSCOPY  2010   left   TONSILLECTOMY AND ADENOIDECTOMY  1957   TOTAL KNEE ARTHROPLASTY Right 09/08/2021   Procedure: TOTAL KNEE ARTHROPLASTY;  Surgeon: Jene Every, MD;  Location: [facility] ORS;  Service: Orthopedics;  Laterality: Right;   TRANSURETHRAL RESECTION OF PROSTATE     x 2   UPPER GASTROINTESTINAL ENDOSCOPY  05/13/2008   hiatal hernia   VASECTOMY      reports that he has quit smoking. He has been exposed to tobacco smoke. He has never used smokeless tobacco. He reports that he does not drink alcohol and does not use drugs. family history includes Allergic rhinitis in his mother; Arthritis in his mother; Breast cancer in his sister; Colon polyps in his father; Diabetes in his father, paternal uncle, and sister; Heart  disease in his father and mother; Hypertension in his father and mother; Liver cancer in his maternal grandfather and paternal uncle; Stroke in his mother. Allergies  Allergen Reactions   Ambien [Zolpidem]     Unknown reaction   Bentyl [Dicyclomine]     Memory loss, anxiety, blurred vision   Carafate [Sucralfate] Nausea And Vomiting   Lansoprazole Diarrhea   Lexapro [Escitalopram Oxalate]     Unknown reaction    Other     Pt states he is sensitive to all oral medications   Pneumovax [Pneumococcal Polysaccharide Vaccine] Other (See Comments)    pain   Prilosec [Omeprazole] Other (See Comments)    Per patient joint pain with PPI's   Remeron [Mirtazapine]     Unknown reaction    Seroquel [Quetiapine] Other (See Comments)    Unknown reaction    Statins Other (See Comments)    Joint pain   Tizanidine Other (See Comments)    Made patient "feel crazy"   Tramadol Other (See Comments)    Pt can take small amounts up to twice daily but continued use causes chest / abdominal pain   Tylenol [Acetaminophen] Nausea And Vomiting    Can only take for 2 takes then he starts to have pain   Current Outpatient Medications on File Prior to Visit  Medication Sig Dispense Refill   diclofenac Sodium (VOLTAREN ARTHRITIS PAIN) 1 % GEL Apply 2 g topically 2 (two) times daily as needed. Up to twice daily to right arm and low back     diphenoxylate-atropine (LOMOTIL) 2.5-0.025 MG tablet Take by mouth 4 (four) times daily as needed for diarrhea or loose stools.     linaclotide (LINZESS) 72 MCG capsule Take 1 capsule (72 mcg total) by mouth daily before breakfast. 12 capsule 0   LORazepam (ATIVAN) 0.5 MG tablet Take 1 tablet (0.5 mg total) by mouth 2 (two) times daily as needed for anxiety. 60 tablet 5   pantoprazole (PROTONIX) 20 MG tablet Take 20 mg by mouth daily.     Peppermint Oil (IBGARD) 90 MG CPCR Use as directed     lidocaine-prilocaine (EMLA) cream Apply 1 Application topically as needed. Apply to  lower abdomen for burning pain (Patient not taking: Reported on 12/06/2023) 30 g 0   Menthol, Topical Analgesic, (BIOFREEZE EX) Apply topically. (Patient not taking: Reported on 12/06/2023)     nitroGLYCERIN (NITROSTAT) 0.4 MG SL tablet Place 1 tablet (0.4 mg total) under the tongue every 5 (five) minutes as needed for chest pain. 90 tablet 3   omega-3 acid ethyl esters (LOVAZA) 1 g  capsule Take 1,250 mg by mouth daily. (Patient not taking: Reported on 11/22/2023)     triamcinolone cream (KENALOG) 0.1 % Apply 1 Application topically 2 (two) times daily. (Patient not taking: Reported on 09/16/2023) 30 g 0   No current facility-administered medications on file prior to visit.        ROS:  All others reviewed and negative.  Objective        PE:  BP 122/76 (BP Location: Right Arm, Patient Position: Sitting, Cuff Size: Normal)   Pulse 80   Temp 98.2 F (36.8 C) (Oral)   Ht 5\' 7"  (1.702 m)   Wt 176 lb (79.8 kg)   SpO2 97%   BMI 27.57 kg/m                 Constitutional: Pt appears in NAD               HENT: Head: NCAT.                Right Ear: External ear normal.                 Left Ear: External ear normal.                Eyes: . Pupils are equal, round, and reactive to light. Conjunctivae and EOM are normal               Nose: without d/c or deformity               Neck: Neck supple. Gross normal ROM               Cardiovascular: Normal rate and regular rhythm.                 Pulmonary/Chest: Effort normal and breath sounds without rales or wheezing.                Abd:  Soft, NT, ND, + BS, no organomegaly               Neurological: Pt is alert. At baseline orientation, motor grossly intact               Skin: Skin is warm. No rashes, no other new lesions, LE edema - none               Psychiatric: Pt behavior is normal without agitation , mod nervous  Micro: none  Cardiac tracings I have personally interpreted today:  none  Pertinent Radiological findings (summarize): none    Lab Results  Component Value Date   WBC 8.4 10/09/2023   HGB 16.1 10/09/2023   HCT 47.7 10/09/2023   PLT 323.0 10/09/2023   GLUCOSE 108 (H) 10/09/2023   CHOL 222 (H) 10/09/2023   TRIG 287.0 (H) 10/09/2023   HDL 38.10 (L) 10/09/2023   LDLDIRECT 169.0 06/24/2018   LDLCALC 126 (H) 10/09/2023   ALT 13 10/09/2023   AST 15 10/09/2023   NA 134 (L) 10/09/2023   K 4.4 10/09/2023   CL 102 10/09/2023   CREATININE 1.41 10/09/2023   BUN 17 10/09/2023   CO2 25 10/09/2023   TSH 3.30 10/09/2023   PSA 0.00 (L) 11/28/2022   HGBA1C 6.0 10/09/2023   Assessment/Plan:  Erik Cola Sr. is a 78 y.o. White or Caucasian [1] male with  has a past medical history of Allergic rhinitis, Allergy, Anal fissure, Anemia, Anxiety, Blood transfusion without reported diagnosis (12/06/2022), Cataract, Coronary atherosclerosis of native coronary  artery (2007), Degenerative arthritis of knee, bilateral (10/09/2017), Depression, Diverticulosis (12/18/2011), Diverticulosis of colon with hemorrhage, Duodenal mass, Fibromyalgia, GERD with stricture, Helicobacter pylori (H. pylori) infection (11/25/2012), Hiatal hernia, HLD (hyperlipidemia), HTN (hypertension), Hyperlipidemia, IBS (irritable bowel syndrome), Impotence of organic origin, Insomnia, Internal and external hemorrhoids without complication, Interstitial cystitis, Irritable bowel syndrome (06/09/2010), Kidney stone, Osteoporosis, Pelvic floor dysfunction (11/29/2017), Pneumonia, Reactive hypoglycemia, Rheumatoid arthritis(714.0), Somatization disorder (02/27/2012), and Vitamin D deficiency (11/29/2017).  Pelvic floor dysfunction D/w pt - will start unusual rotating every 1 wk treatment regimen of tramadol prn, then diazepam supp prn, then gabapentin prn, and also f/u urology to consider trigger point injections.  to f/u any worsening symptoms or concerns  Anxiety Chronic persistent, pt reassured  GERD with stricture Stable overall,  to f/u any worsening  symptoms or concerns   Vitamin D deficiency Last vitamin D Lab Results  Component Value Date   VD25OH 32.16 11/28/2022   Low, to start oral replacement  Followup: Return if symptoms worsen or fail to improve.  Oliver Barre, MD 12/09/2023 9:23 PM [facility]  Primary Care - [facility]

## 2023-12-09 ENCOUNTER — Encounter: Payer: Self-pay | Admitting: Internal Medicine

## 2023-12-09 NOTE — Assessment & Plan Note (Signed)
Stable overall,  to f/u any worsening symptoms or concerns

## 2023-12-09 NOTE — Assessment & Plan Note (Signed)
Last vitamin D Lab Results  Component Value Date   VD25OH 32.16 11/28/2022   Low, to start oral replacement

## 2023-12-09 NOTE — Assessment & Plan Note (Signed)
Chronic persistent, pt reassured

## 2023-12-09 NOTE — Assessment & Plan Note (Signed)
D/w pt - will start unusual rotating every 1 wk treatment regimen of tramadol prn, then diazepam supp prn, then gabapentin prn, and also f/u urology to consider trigger point injections.  to f/u any worsening symptoms or concerns

## 2023-12-10 ENCOUNTER — Encounter: Payer: Self-pay | Admitting: Urology

## 2023-12-13 ENCOUNTER — Encounter: Payer: Self-pay | Admitting: Internal Medicine

## 2023-12-14 DIAGNOSIS — Z936 Other artificial openings of urinary tract status: Secondary | ICD-10-CM | POA: Diagnosis not present

## 2023-12-16 ENCOUNTER — Ambulatory Visit: Payer: Medicare Other | Admitting: Psychiatry

## 2023-12-17 ENCOUNTER — Encounter: Payer: Self-pay | Admitting: Internal Medicine

## 2023-12-17 DIAGNOSIS — Z936 Other artificial openings of urinary tract status: Secondary | ICD-10-CM | POA: Diagnosis not present

## 2023-12-17 DIAGNOSIS — N281 Cyst of kidney, acquired: Secondary | ICD-10-CM | POA: Diagnosis not present

## 2023-12-24 ENCOUNTER — Ambulatory Visit: Payer: PRIVATE HEALTH INSURANCE

## 2023-12-30 DIAGNOSIS — M545 Low back pain, unspecified: Secondary | ICD-10-CM | POA: Diagnosis not present

## 2023-12-30 DIAGNOSIS — M6281 Muscle weakness (generalized): Secondary | ICD-10-CM | POA: Diagnosis not present

## 2023-12-30 DIAGNOSIS — M62838 Other muscle spasm: Secondary | ICD-10-CM | POA: Diagnosis not present

## 2023-12-31 ENCOUNTER — Ambulatory Visit: Payer: Medicare Other

## 2023-12-31 DIAGNOSIS — N3010 Interstitial cystitis (chronic) without hematuria: Secondary | ICD-10-CM | POA: Diagnosis not present

## 2023-12-31 DIAGNOSIS — Z906 Acquired absence of other parts of urinary tract: Secondary | ICD-10-CM | POA: Diagnosis not present

## 2023-12-31 DIAGNOSIS — G8929 Other chronic pain: Secondary | ICD-10-CM | POA: Diagnosis not present

## 2023-12-31 DIAGNOSIS — M6289 Other specified disorders of muscle: Secondary | ICD-10-CM | POA: Diagnosis not present

## 2023-12-31 DIAGNOSIS — Z9889 Other specified postprocedural states: Secondary | ICD-10-CM | POA: Diagnosis not present

## 2024-01-07 ENCOUNTER — Ambulatory Visit (INDEPENDENT_AMBULATORY_CARE_PROVIDER_SITE_OTHER): Payer: Medicare Other

## 2024-01-07 VITALS — Ht 67.0 in | Wt 176.0 lb

## 2024-01-07 DIAGNOSIS — Z0000 Encounter for general adult medical examination without abnormal findings: Secondary | ICD-10-CM | POA: Diagnosis not present

## 2024-01-07 NOTE — Progress Notes (Signed)
 Subjective:   Erik Cola Sr. is a 78 y.o. male who presents for Medicare Annual/Subsequent preventive examination.  Visit Complete: Virtual I connected with  Erik Cola Sr. on 01/07/24 by a video and audio enabled telemedicine application and verified that I am speaking with the correct person using two identifiers.  Patient Location: Home  Provider Location: Home Office  I discussed the limitations of evaluation and management by telemedicine. The patient expressed understanding and agreed to proceed.  Vital Signs: Because this visit was a virtual/telehealth visit, some criteria may be missing or patient reported. Any vitals not documented were not able to be obtained and vitals that have been documented are patient reported.   Cardiac Risk Factors include: male gender;advanced age (>15men, >1 women);Other (see comment);dyslipidemia, Risk factor comments: CAD, AKI     Objective:    Today's Vitals   01/07/24 1543 01/07/24 1545  Weight: 176 lb (79.8 kg)   Height: 5\' 7"  (1.702 m)   PainSc:  3    Body mass index is 27.57 kg/m.     01/07/2024    3:54 PM 02/05/2023    9:39 PM 05/29/2022    7:59 AM 12/08/2021   10:41 AM 09/09/2021   12:00 AM 09/08/2021    5:40 PM 09/08/2021   11:31 AM  Advanced Directives  Does Patient Have a Medical Advance Directive? Yes Yes Yes Yes Yes  Yes  Type of Estate agent of [place];Living will Living will;Healthcare Power of [employer] Power of [employer] Power of [place];Living will Healthcare Power of [place];Living will Healthcare Power of [place];Living will Healthcare Power of [place];Living will  Does patient want to make changes to medical advance directive? No - Patient declined No - Patient declined No - Patient declined  No - Patient declined    Copy of Healthcare Power of Attorney in Chart? Yes - validated most recent copy scanned in chart (See row information) Yes - validated most recent  copy scanned in chart (See row information) Yes - validated most recent copy scanned in chart (See row information) No - copy requested No - copy requested No - copy requested Yes - validated most recent copy scanned in chart (See row information)    Current Medications (verified) Outpatient Encounter Medications as of 01/07/2024  Medication Sig   acetaminophen (TYLENOL) 500 MG tablet Take 500 mg by mouth every 6 (six) hours as needed.   amitriptyline (ELAVIL) 25 MG tablet Take 1 tablet by mouth at bedtime.   bismuth subsalicylate (PEPTO BISMOL) 262 MG/15ML suspension Take 30 mLs by mouth every 6 (six) hours as needed.   LORazepam (ATIVAN) 0.5 MG tablet Take 1 tablet (0.5 mg total) by mouth 2 (two) times daily as needed for anxiety.   Menthol, Topical Analgesic, (BIOFREEZE EX) Apply topically.   pantoprazole (PROTONIX) 20 MG tablet Take 10 mg by mouth daily.   traMADol (ULTRAM) 50 MG tablet Take 1 tablet (50 mg total) by mouth every 6 (six) hours as needed.   diclofenac Sodium (VOLTAREN ARTHRITIS PAIN) 1 % GEL Apply 2 g topically 2 (two) times daily as needed. Up to twice daily to right arm and low back (Patient not taking: Reported on 01/07/2024)   diphenoxylate-atropine (LOMOTIL) 2.5-0.025 MG tablet Take by mouth 4 (four) times daily as needed for diarrhea or loose stools. (Patient not taking: Reported on 01/07/2024)   lidocaine-prilocaine (EMLA) cream Apply 1 Application topically as needed. Apply to lower abdomen for burning pain (Patient not taking: Reported  on 11/22/2023)   linaclotide (LINZESS) 72 MCG capsule Take 1 capsule (72 mcg total) by mouth daily before breakfast. (Patient not taking: Reported on 01/07/2024)   nitroGLYCERIN (NITROSTAT) 0.4 MG SL tablet Place 1 tablet (0.4 mg total) under the tongue every 5 (five) minutes as needed for chest pain.   omega-3 acid ethyl esters (LOVAZA) 1 g capsule Take 1,250 mg by mouth daily. (Patient not taking: Reported on 11/22/2023)   Peppermint Oil  (IBGARD) 90 MG CPCR Use as directed (Patient not taking: Reported on 01/07/2024)   triamcinolone cream (KENALOG) 0.1 % Apply 1 Application topically 2 (two) times daily. (Patient not taking: Reported on 01/07/2024)   No facility-administered encounter medications on file as of 01/07/2024.    Allergies (verified) Ambien [zolpidem], Bentyl [dicyclomine], Carafate [sucralfate], Lansoprazole, Lexapro [escitalopram oxalate], Other, Pneumovax [pneumococcal polysaccharide vaccine], Prilosec [omeprazole], Remeron [mirtazapine], Seroquel [quetiapine], Statins, Tizanidine, Tramadol, and Tylenol [acetaminophen]   History: Past Medical History:  Diagnosis Date   Allergic rhinitis    Allergy    Anal fissure    Anemia    Anxiety    Blood transfusion without reported diagnosis 12/06/2022   also 2 in the past   Cataract    starting   Coronary atherosclerosis of native coronary artery 2007   nonobstructive CAD by cath with 40% mid-LAD stenosis   Degenerative arthritis of knee, 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   Vitamin D deficiency 11/29/2017   Past Surgical History:  Procedure Laterality Date   ADENOIDECTOMY     bladder distention     x 6    CARDIAC CATHETERIZATION     CATHETER REMOVAL     super pubic area   COLONOSCOPY     CORONARY STENT INTERVENTION N/A 05/29/2022   Procedure: CORONARY STENT INTERVENTION;  Surgeon: Swaziland, Peter M, MD;  Location: [facility] INVASIVE CV LAB;  Service: Cardiovascular;  Laterality: N/A;   DENTAL SURGERY Right    #30 tooth extraction (right upper)   ESOPHAGOGASTRODUODENOSCOPY  12/17/2022   INTERSTIM IMPLANT PLACEMENT     INTERSTIM IMPLANT REMOVAL     KNEE ARTHROSCOPY     right x 2   LEFT HEART CATH AND CORONARY ANGIOGRAPHY N/A 05/29/2022   Procedure: LEFT HEART CATH AND CORONARY ANGIOGRAPHY;  Surgeon: Swaziland, Peter M, MD;  Location: [facility] INVASIVE CV LAB;  Service: Cardiovascular;  Laterality: N/A;   NISSEN FUNDOPLICATION  2009   PROSTECTOMY     ROBOT ASSISTED LAPAROSCOPIC COMPLETE CYSTECT ILEAL CONDUIT     SHOULDER ARTHROSCOPY  2010   left   TONSILLECTOMY AND ADENOIDECTOMY  1957   TOTAL KNEE ARTHROPLASTY Right  09/08/2021   Procedure: TOTAL KNEE ARTHROPLASTY;  Surgeon: Jene Every, MD;  Location: [facility] ORS;  Service: Orthopedics;  Laterality: Right;   TRANSURETHRAL RESECTION OF PROSTATE     x 2   UPPER GASTROINTESTINAL ENDOSCOPY  05/13/2008   hiatal hernia   VASECTOMY     Family History  Problem Relation Age of Onset   Allergic rhinitis Mother    Heart disease Mother    Hypertension Mother    Stroke Mother    Arthritis Mother    Colon polyps Father    Diabetes Father    Heart disease Father    Hypertension Father    Diabetes Sister    Breast cancer Sister    Liver cancer Maternal Grandfather    Diabetes Paternal Uncle    Liver cancer Paternal Uncle    Colon cancer Neg Hx    Esophageal cancer Neg Hx    Social History   Socioeconomic History   Marital status: Married    Spouse name: Phillis   Number of children: 4   Years of education: Not on file   Highest education level: Not on file  Occupational History   Occupation: retired  Tobacco Use   Smoking status: Former     Passive exposure: Past   Smokeless tobacco: Never   Tobacco comments:    quit 1980  Vaping Use   Vaping status: Never Used  Substance and Sexual Activity   Alcohol use: No    Alcohol/week: 0.0 standard drinks of alcohol   Drug use: No   Sexual activity: Yes    Partners: Female  Other Topics Concern   Not on file  Social History Narrative   Married, retired for children   He is a former smoker no alcohol or substance use      Lives with wife   Social Drivers of Corporate investment banker Strain: Low Risk  (01/07/2024)   Overall Financial Resource Strain (CARDIA)    Difficulty of Paying Living Expenses: Not very hard  Food Insecurity: No Food Insecurity (01/07/2024)   Hunger Vital Sign    Worried About Running Out of Food in the Last Year: Never true    Ran Out of Food in the Last Year: Never true  Transportation Needs: No Transportation Needs (01/07/2024)   PRAPARE - Administrator, Civil Service (Medical): No    Lack of Transportation (Non-Medical): No  Physical Activity: Insufficiently Active (01/07/2024)   Exercise Vital Sign    Days of Exercise per Week: 2 days    Minutes of Exercise per Session: 10 min  Stress: Stress Concern Present (01/07/2024)   [employer] of Occupational Health - Occupational Stress Questionnaire    Feeling of Stress : To some extent  Social Connections: Moderately Integrated (01/07/2024)   Social Connection and Isolation Panel [NHANES]    Frequency of Communication with Friends and Family: More than three times a week    Frequency of Social Gatherings with Friends and Family: More than three times a week    Attends Religious Services: More than 4 times per year    Active Member of [employer] or Organizations: No    Attends Banker Meetings: Never    Marital Status: Married    Tobacco Counseling Counseling given: Not Answered Tobacco comments: quit 1980   Clinical Intake:  Pre-visit preparation completed:  Yes  Pain : 0-10 Pain Score: 3  Pain Location: Back (both arms) Pain Orientation: Left Pain Descriptors / Indicators: Aching,  Discomfort Pain Onset: More than a month ago Pain Frequency: Intermittent Pain Relieving Factors: TRamadol  Pain Relieving Factors: TRamadol  BMI - recorded: 27.57 Nutritional Status: BMI 25 -29 Overweight Nutritional Risks: None  How often do you need to have someone help you when you read instructions, pamphlets, or other written materials from your doctor or pharmacy?: 1 - Never  Interpreter Needed?: No  Information entered by :: Kenzel Ruesch, RMA   Activities of Daily Living    01/07/2024    3:48 PM 02/05/2023    9:39 PM  In your present state of health, do you have any difficulty performing the following activities:  Hearing? 0 0  Vision? 0 0  Difficulty concentrating or making decisions? 0 1  Walking or climbing stairs? 0 1  Dressing or bathing? 1 0  Comment wife helps him   Doing errands, shopping? 0 0  Preparing Food and eating ? N N  Using the Toilet? N N  In the past six months, have you accidently leaked urine? Y N  Comment Pelvic floor dysfunction   Do you have problems with loss of bowel control? N N  Managing your Medications? N N  Managing your Finances? N N  Housekeeping or managing your Housekeeping? N Y    Patient Care Team: Corwin Levins, MD as PCP - General (Internal Medicine) O'Neal, Ronnald Ramp, MD as PCP - Cardiology (Cardiology) Corwin Levins, MD as Attending Physician (Internal Medicine) Jamison Neighbor, MD (Urology) Yvette Rack, OD as Consulting Physician (Optometry) Rachel Moulds, MD as Consulting Physician (Hematology and Oncology)  Indicate any recent Medical Services you may have received from other than [facility] providers in the past year (date may be approximate).     Assessment:   This is a routine wellness examination for [name].  Hearing/Vision screen Hearing Screening - Comments:: Denies hearing  difficulties   Vision Screening - Comments:: Wears eyeglasses   Goals Addressed             This Visit's Progress    Increase my physical activity   On track    Continue to stretch and walk as much as possible.      Depression Screen    01/07/2024    3:58 PM 12/06/2023    3:44 PM 10/30/2023    1:19 PM 10/09/2023    8:29 AM 09/09/2023    1:10 PM 09/09/2023   12:45 PM 07/04/2023    2:48 PM  PHQ 2/9 Scores  PHQ - 2 Score 3 0 2 0 2 2 2   PHQ- 9 Score 8    10 11 18     Fall Risk    01/07/2024    3:55 PM 12/06/2023    3:44 PM 10/30/2023    1:19 PM 10/09/2023    8:29 AM 09/09/2023    1:11 PM  Fall Risk   Falls in the past year? 0 0 0 0 0  Number falls in past yr: 0 0 0 0 0  Injury with Fall? 0 0 0 0 0  Risk for fall due to : No Fall Risks No Fall Risks  No Fall Risks   Follow up Falls prevention discussed;Falls evaluation completed Falls evaluation completed  Falls evaluation completed     MEDICARE RISK AT HOME: Medicare Risk at Home Any stairs in or around the home?: No Home free of loose throw rugs in walkways, pet beds, electrical cords, etc?: Yes Adequate lighting in your home to reduce risk of falls?:  Yes Life alert?: No Use of a cane, walker or w/c?: No Grab bars in the bathroom?: No Shower chair or bench in shower?: Yes Elevated toilet seat or a handicapped toilet?: Yes  TIMED UP AND GO:  Was the test performed?  No    Cognitive Function:        01/07/2024    3:55 PM 02/05/2023    9:39 PM  6CIT Screen  What Year? 0 points 0 points  What month? 0 points 0 points  What time? 0 points 0 points  Count back from 20 0 points 0 points  Months in reverse 0 points 2 points  Repeat phrase 0 points 4 points  Total Score 0 points 6 points    Immunizations Immunization History  Administered Date(s) Administered   Moderna Sars-Covid-2 Vaccination 01/11/2020, 02/08/2020   Pneumococcal Polysaccharide-23 07/03/2013   Pneumococcal-Unspecified 11/12/2012   Tdap  08/23/2014    TDAP status: Up to date  Flu Vaccine status: Declined, Education has been provided regarding the importance of this vaccine but patient still declined. Advised may receive this vaccine at local pharmacy or Health Dept. Aware to provide a copy of the vaccination record if obtained from local pharmacy or Health Dept. Verbalized acceptance and understanding.  Pneumococcal vaccine status: Declined,  Education has been provided regarding the importance of this vaccine but patient still declined. Advised may receive this vaccine at local pharmacy or Health Dept. Aware to provide a copy of the vaccination record if obtained from local pharmacy or Health Dept. Verbalized acceptance and understanding.   Covid-19 vaccine status: Declined, Education has been provided regarding the importance of this vaccine but patient still declined. Advised may receive this vaccine at local pharmacy or Health Dept.or vaccine clinic. Aware to provide a copy of the vaccination record if obtained from local pharmacy or Health Dept. Verbalized acceptance and understanding.  Qualifies for Shingles Vaccine? Yes   Zostavax completed  DECLINES   Shingrix Completed?: No.    Education has been provided regarding the importance of this vaccine. Patient has been advised to call insurance company to determine out of pocket expense if they have not yet received this vaccine. Advised may also receive vaccine at local pharmacy or Health Dept. Verbalized acceptance and understanding.  Screening Tests Health Maintenance  Topic Date Due   Zoster Vaccines- Shingrix (1 of 2) 01/09/2024 (Originally 03/12/1965)   INFLUENZA VACCINE  02/10/2024 (Originally 06/13/2023)   DTaP/Tdap/Td (2 - Td or Tdap) 08/23/2024   Medicare Annual Wellness (AWV)  01/06/2025   Colonoscopy  09/05/2027   Hepatitis C Screening  Completed   HPV VACCINES  Aged Out   Pneumonia Vaccine 34+ Years old  Discontinued   COVID-19 Vaccine  Discontinued    Health  Maintenance  There are no preventive care reminders to display for this patient.   Colorectal cancer screening: Type of screening: Sigmoidoscopy. Completed 02/09/2022. Repeat every N/A years  Lung Cancer Screening: (Low Dose CT Chest recommended if Age 26-80 years, 20 pack-year currently smoking OR have quit w/in 15years.) does not qualify.   Lung Cancer Screening Referral: N/A  Additional Screening:  Hepatitis C Screening: does qualify; Completed 07/26/2016  Vision Screening: Recommended annual ophthalmology exams for early detection of glaucoma and other disorders of the eye. Is the patient up to date with their annual eye exam?  Yes  Who is the provider or what is the name of the office in which the patient attends annual eye exams? Dr. Leron Croak scheduled3/01/2024 If pt is  not established with a provider, would they like to be referred to a provider to establish care? No .   Dental Screening: Recommended annual dental exams for proper oral hygiene   Community Resource Referral / Chronic Care Management: CRR required this visit?  No   CCM required this visit?  No     Plan:     I have personally reviewed and noted the following in the patient's chart:   Medical and social history Use of alcohol, tobacco or illicit drugs  Current medications and supplements including opioid prescriptions. Patient is currently taking opioid prescriptions. Information provided to patient regarding non-opioid alternatives. Patient advised to discuss non-opioid treatment plan with their provider. Functional ability and status Nutritional status Physical activity Advanced directives List of other physicians Hospitalizations, surgeries, and ER visits in previous 12 months Vitals Screenings to include cognitive, depression, and falls Referrals and appointments  In addition, I have reviewed and discussed with patient certain preventive protocols, quality metrics, and best practice  recommendations. A written personalized care plan for preventive services as well as general preventive health recommendations were provided to patient.     Afra Tricarico L Angela Vazguez, CMA   01/07/2024   After Visit Summary: (MyChart) Due to this being a telephonic visit, the after visit summary with patients personalized plan was offered to patient via MyChart   Nurse Notes: Patient declines all vaccines.  He is up to date with all other health maintenance, with no concerns to address today.

## 2024-01-07 NOTE — Patient Instructions (Signed)
 [name] , Thank you for taking time to come for your Medicare Wellness Visit. I appreciate your ongoing commitment to your health goals. Please review the following plan we discussed and let me know if I can assist you in the future.   Referrals/Orders/Follow-Ups/Clinician Recommendations: It was nice to talk with you today.    This is a list of the screening recommended for you and due dates:  Health Maintenance  Topic Date Due   Zoster (Shingles) Vaccine (1 of 2) 01/09/2024*   Flu Shot  02/10/2024*   DTaP/Tdap/Td vaccine (2 - Td or Tdap) 08/23/2024   Medicare Annual Wellness Visit  01/06/2025   Colon Cancer Screening  09/05/2027   Hepatitis C Screening  Completed   HPV Vaccine  Aged Out   Pneumonia Vaccine  Discontinued   COVID-19 Vaccine  Discontinued  *Topic was postponed. The date shown is not the original due date.    Advanced directives: (In Chart) A copy of your advanced directives are scanned into your chart should your provider ever need it.  Next Medicare Annual Wellness Visit scheduled for next year: Yes  Managing Pain Without Opioids Opioids are strong medicines used to treat moderate to severe pain. For some people, especially those who have long-term (chronic) pain, opioids may not be the best choice for pain management due to: Side effects like nausea, constipation, and sleepiness. The risk of addiction (opioid use disorder). The longer you take opioids, the greater your risk of addiction. Pain that lasts for more than 3 months is called chronic pain. Managing chronic pain usually requires more than one approach and is often provided by a team of health care providers working together (multidisciplinary approach). Pain management may be done at a pain management center or pain clinic. How to manage pain without the use of opioids Use non-opioid medicines Non-opioid medicines for pain may include: Over-the-counter or prescription non-steroidal anti-inflammatory drugs  (NSAIDs). These may be the first medicines used for pain. They work well for muscle and bone pain, and they reduce swelling. Acetaminophen. This over-the-counter medicine may work well for milder pain but not swelling. Antidepressants. These may be used to treat chronic pain. A certain type of antidepressant (tricyclics) is often used. These medicines are given in lower doses for pain than when used for depression. Anticonvulsants. These are usually used to treat seizures but may also reduce nerve (neuropathic) pain. Muscle relaxants. These relieve pain caused by sudden muscle tightening (spasms). You may also use a pain medicine that is applied to the skin as a patch, cream, or gel (topical analgesic), such as a numbing medicine. These may cause fewer side effects than medicines taken by mouth. Do certain therapies as directed Some therapies can help with pain management. They include: Physical therapy. You will do exercises to gain strength and flexibility. A physical therapist may teach you exercises to move and stretch parts of your body that are weak, stiff, or painful. You can learn these exercises at physical therapy visits and practice them at home. Physical therapy may also involve: Massage. Heat wraps or applying heat or cold to affected areas. Electrical signals that interrupt pain signals (transcutaneous electrical nerve stimulation, TENS). Weak lasers that reduce pain and swelling (low-level laser therapy). Signals from your body that help you learn to regulate pain (biofeedback). Occupational therapy. This helps you to learn ways to function at home and work with less pain. Recreational therapy. This involves trying new activities or hobbies, such as a physical activity or  drawing. Mental health therapy, including: Cognitive behavioral therapy (CBT). This helps you learn coping skills for dealing with pain. Acceptance and commitment therapy (ACT) to change the way you think and react  to pain. Relaxation therapies, including muscle relaxation exercises and mindfulness-based stress reduction. Pain management counseling. This may be individual, family, or group counseling.  Receive medical treatments Medical treatments for pain management include: Nerve block injections. These may include a pain blocker and anti-inflammatory medicines. You may have injections: Near the spine to relieve chronic back or neck pain. Into joints to relieve back or joint pain. Into nerve areas that supply a painful area to relieve body pain. Into muscles (trigger point injections) to relieve some painful muscle conditions. A medical device placed near your spine to help block pain signals and relieve nerve pain or chronic back pain (spinal cord stimulation device). Acupuncture. Follow these instructions at home Medicines Take over-the-counter and prescription medicines only as told by your health care provider. If you are taking pain medicine, ask your health care providers about possible side effects to watch out for. Do not drive or use heavy machinery while taking prescription opioid pain medicine. Lifestyle  Do not use drugs or alcohol to reduce pain. If you drink alcohol, limit how much you have to: 0-1 drink a day for women who are not pregnant. 0-2 drinks a day for men. Know how much alcohol is in a drink. In the [place] one drink equals one 12 oz bottle of beer (355 mL), one 5 oz glass of wine (148 mL), or one 1 oz glass of hard liquor (44 mL). Do not use any products that contain nicotine or tobacco. These products include cigarettes, chewing tobacco, and vaping devices, such as e-cigarettes. If you need help quitting, ask your health care provider. Eat a healthy diet and maintain a healthy weight. Poor diet and excess weight may make pain worse. Eat foods that are high in fiber. These include fresh fruits and vegetables, whole grains, and beans. Limit foods that are high in fat and  processed sugars, such as fried and sweet foods. Exercise regularly. Exercise lowers stress and may help relieve pain. Ask your health care provider what activities and exercises are safe for you. If your health care provider approves, join an exercise class that combines movement and stress reduction. Examples include yoga and tai chi. Get enough sleep. Lack of sleep may make pain worse. Lower stress as much as possible. Practice stress reduction techniques as told by your therapist. General instructions Work with all your pain management providers to find the treatments that work best for you. You are an important member of your pain management team. There are many things you can do to reduce pain on your own. Consider joining an online or in-person support group for people who have chronic pain. Keep all follow-up visits. This is important. Where to find more information You can find more information about managing pain without opioids from: American Academy of Pain Medicine: painmed.org Institute for Chronic Pain: instituteforchronicpain.org American Chronic Pain Association: theacpa.org Contact a health care provider if: You have side effects from pain medicine. Your pain gets worse or does not get better with treatments or home therapy. You are struggling with anxiety or depression. Summary Many types of pain can be managed without opioids. Chronic pain may respond better to pain management without opioids. Pain is best managed when you and a team of health care providers work together. Pain management without opioids may include non-opioid  medicines, medical treatments, physical therapy, mental health therapy, and lifestyle changes. Tell your health care providers if your pain gets worse or is not being managed well enough. This information is not intended to replace advice given to you by your health care provider. Make sure you discuss any questions you have with your health care  provider. Document Revised: 02/08/2021 Document Reviewed: 02/08/2021 Elsevier Patient Education  2024 [employer].

## 2024-01-09 ENCOUNTER — Encounter: Payer: Self-pay | Admitting: Hematology and Oncology

## 2024-01-09 ENCOUNTER — Inpatient Hospital Stay (HOSPITAL_BASED_OUTPATIENT_CLINIC_OR_DEPARTMENT_OTHER): Payer: Medicare Other | Admitting: Hematology and Oncology

## 2024-01-09 ENCOUNTER — Other Ambulatory Visit: Payer: Self-pay | Admitting: Internal Medicine

## 2024-01-09 ENCOUNTER — Inpatient Hospital Stay: Payer: Medicare Other | Attending: Hematology and Oncology

## 2024-01-09 VITALS — BP 141/67 | HR 82 | Temp 98.0°F | Resp 17 | Wt 174.5 lb

## 2024-01-09 DIAGNOSIS — Z862 Personal history of diseases of the blood and blood-forming organs and certain disorders involving the immune mechanism: Secondary | ICD-10-CM

## 2024-01-09 DIAGNOSIS — K922 Gastrointestinal hemorrhage, unspecified: Secondary | ICD-10-CM | POA: Diagnosis not present

## 2024-01-09 DIAGNOSIS — D500 Iron deficiency anemia secondary to blood loss (chronic): Secondary | ICD-10-CM

## 2024-01-09 LAB — CBC WITH DIFFERENTIAL (CANCER CENTER ONLY)
Abs Immature Granulocytes: 0.02 10*3/uL (ref 0.00–0.07)
Basophils Absolute: 0.1 10*3/uL (ref 0.0–0.1)
Basophils Relative: 1 %
Eosinophils Absolute: 0.1 10*3/uL (ref 0.0–0.5)
Eosinophils Relative: 2 %
HCT: 48.6 % (ref 39.0–52.0)
Hemoglobin: 16.3 g/dL (ref 13.0–17.0)
Immature Granulocytes: 0 %
Lymphocytes Relative: 30 %
Lymphs Abs: 2.6 10*3/uL (ref 0.7–4.0)
MCH: 30.5 pg (ref 26.0–34.0)
MCHC: 33.5 g/dL (ref 30.0–36.0)
MCV: 91 fL (ref 80.0–100.0)
Monocytes Absolute: 0.9 10*3/uL (ref 0.1–1.0)
Monocytes Relative: 11 %
Neutro Abs: 5 10*3/uL (ref 1.7–7.7)
Neutrophils Relative %: 56 %
Platelet Count: 269 10*3/uL (ref 150–400)
RBC: 5.34 MIL/uL (ref 4.22–5.81)
RDW: 12.5 % (ref 11.5–15.5)
WBC Count: 8.6 10*3/uL (ref 4.0–10.5)
nRBC: 0 % (ref 0.0–0.2)

## 2024-01-09 LAB — FERRITIN: Ferritin: 30 ng/mL (ref 24–336)

## 2024-01-09 LAB — IRON AND IRON BINDING CAPACITY (CC-WL,HP ONLY)
Iron: 115 ug/dL (ref 45–182)
Saturation Ratios: 31 % (ref 17.9–39.5)
TIBC: 368 ug/dL (ref 250–450)
UIBC: 253 ug/dL (ref 117–376)

## 2024-01-09 NOTE — Progress Notes (Signed)
 [facility] CONSULT NOTE  Patient Care Team: Corwin Levins, MD as PCP - General (Internal Medicine) O'Neal, Ronnald Ramp, MD as PCP - Cardiology (Cardiology) Corwin Levins, MD as Attending Physician (Internal Medicine) Jamison Neighbor, MD (Urology) Yvette Rack, OD as Consulting Physician (Optometry) Rachel Moulds, MD as Consulting Physician (Hematology and Oncology)  CHIEF COMPLAINTS/PURPOSE OF CONSULTATION:  Iron deficiency anemia  ASSESSMENT & PLAN:   Iron deficiency anemia due to chronic blood loss  This is a very pleasant 78 year old male patient with past medical history significant for GI bleed referred to hematology for further recommendations regarding his persistent iron deficiency and severe anemia.  He continues to have some fatigue, dizziness, and intermittent heart racing.  He is recovering from UTI. He says he is worried about knee prosthesis infection.  No concerns on physical exam.  CBC from today shows mild leukocytosis with white blood cell count of 10,900, hemoglobin normal at 15.2 and no evidence of thrombocytopenia.  At this time there is no concern for recurrence of anemia hence we will see him in about 6 months or sooner as needed.  He should continue to follow-up with his urologist for his UTI related issues. From my evaluation, he doesn't appear to have systemic infection Thank you for consulting on the care of this patient.  Please not hesitate to contact us with any additional questions or concerns.   No orders of the defined types were placed in this encounter. Total time spent: 30 minutes including history and physical, review of records, counseling and coordination of care  HISTORY OF PRESENTING ILLNESS:  Erik Cola Sr. 78 y.o. male is here because of Iron deficiency  This is a very pleasant 78 year old male patient with multiple medical comorbidities, GI bleed last year who presents to hematology for further recommendations regarding  his iron deficiency.  Since his last visit here, his now recovering from a UTI, had to take antibiotics.  He is very worried that if his UTI and the infection can hurt his knee prosthesis since he had knee replacement.  He denies any fevers or chills or flank tenderness or abdominal tenderness today.  He seems to be recovering from a UTI.  He continues to have some chronic fatigue, intermittent shortness of breath.  He has noted very minimal amount of bright red blood in stool especially when he wipes.  No black stool. Rest of the pertinent 10 point ROS reviewed and neg.  MEDICAL HISTORY:  Past Medical History:  Diagnosis Date   Allergic rhinitis    Allergy    Anal fissure    Anemia    Anxiety    Blood transfusion without reported diagnosis 12/06/2022   also 2 in the past   Cataract    starting   Coronary atherosclerosis of native coronary artery 2007   nonobstructive CAD by cath with 40% mid-LAD stenosis   Degenerative arthritis of knee, bilateral 10/09/2017   Depression    Diverticulosis 12/18/2011   Diverticulosis of colon with hemorrhage    April 2021, [facility]   Duodenal mass    Fibromyalgia    GERD with stricture    Helicobacter pylori (H. 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   Vitamin D deficiency 11/29/2017    SURGICAL HISTORY: Past Surgical History:  Procedure Laterality Date   ADENOIDECTOMY     bladder distention     x 6    CARDIAC CATHETERIZATION     CATHETER REMOVAL     super pubic area   COLONOSCOPY     CORONARY STENT INTERVENTION N/A 05/29/2022   Procedure: CORONARY STENT INTERVENTION;  Surgeon: Swaziland, Peter M, MD;  Location: [facility] INVASIVE CV LAB;  Service: Cardiovascular;  Laterality: N/A;   DENTAL SURGERY Right    #30 tooth extraction (right upper)   ESOPHAGOGASTRODUODENOSCOPY  12/17/2022   INTERSTIM IMPLANT PLACEMENT     INTERSTIM IMPLANT REMOVAL     KNEE ARTHROSCOPY     right x 2   LEFT HEART CATH AND CORONARY ANGIOGRAPHY N/A 05/29/2022   Procedure: LEFT HEART CATH AND CORONARY ANGIOGRAPHY;  Surgeon: Swaziland, Peter M, MD;  Location: [facility] INVASIVE CV LAB;  Service: Cardiovascular;  Laterality: N/A;   NISSEN FUNDOPLICATION  2009   PROSTECTOMY     ROBOT ASSISTED LAPAROSCOPIC COMPLETE CYSTECT ILEAL CONDUIT     SHOULDER ARTHROSCOPY  2010   left   TONSILLECTOMY AND ADENOIDECTOMY  1957   TOTAL KNEE ARTHROPLASTY Right 09/08/2021   Procedure: TOTAL KNEE ARTHROPLASTY;  Surgeon: Jene Every, MD;  Location: [facility] ORS;  Service: Orthopedics;  Laterality: Right;   TRANSURETHRAL RESECTION OF PROSTATE     x 2   UPPER GASTROINTESTINAL ENDOSCOPY  05/13/2008   hiatal hernia   VASECTOMY      SOCIAL HISTORY: Social History   Socioeconomic History   Marital status: Married    Spouse name: Phillis   Number of children: 4   Years of education: Not on file   Highest education level: Not on file  Occupational History   Occupation: retired  Tobacco Use   Smoking status: Former    Passive exposure: Past   Smokeless tobacco: Never   Tobacco comments:    quit 1980  Vaping Use   Vaping status: Never Used  Substance and Sexual Activity   Alcohol use: No    Alcohol/week: 0.0 standard drinks of alcohol   Drug use: No   Sexual activity: Yes    Partners: Female  Other Topics Concern   Not on file  Social History Narrative   Married, retired for children   He is a former smoker no alcohol or substance use       Lives with wife   Social Drivers of Corporate investment banker Strain: Low Risk  (01/07/2024)   Overall Financial Resource Strain (CARDIA)    Difficulty of Paying Living Expenses: Not very hard  Food Insecurity: No Food Insecurity (01/07/2024)   Hunger Vital Sign    Worried About Running Out of Food in the Last Year: Never true    Ran Out of Food in the Last Year: Never true  Transportation Needs: No Transportation Needs (01/07/2024)   PRAPARE - Administrator, Civil Service (Medical): No    Lack of Transportation (Non-Medical): No  Physical Activity: Insufficiently Active (01/07/2024)   Exercise Vital Sign    Days  of Exercise per Week: 2 days    Minutes of Exercise per Session: 10 min  Stress: Stress Concern Present (01/07/2024)   [employer] of Occupational Health - Occupational Stress Questionnaire    Feeling of Stress : To some extent  Social Connections: Moderately Integrated (01/07/2024)   Social Connection and Isolation Panel [NHANES]    Frequency of Communication with Friends and Family: More than three times a week    Frequency of Social Gatherings with Friends and Family: More than three times a week    Attends Religious Services: More than 4 times per year    Active Member of [employer] or Organizations: No    Attends Banker Meetings: Never    Marital Status: Married  Catering manager Violence: Not At Risk (01/07/2024)   Humiliation, Afraid, Rape, and Kick questionnaire    Fear of Current or Ex-Partner: No    Emotionally Abused: No    Physically Abused: No    Sexually Abused: No    FAMILY HISTORY: Family History  Problem Relation Age of Onset   Allergic rhinitis Mother    Heart disease Mother    Hypertension Mother    Stroke Mother    Arthritis Mother    Colon polyps Father    Diabetes Father    Heart disease Father    Hypertension Father    Diabetes Sister    Breast cancer Sister    Liver cancer Maternal Grandfather    Diabetes  Paternal Uncle    Liver cancer Paternal Uncle    Colon cancer Neg Hx    Esophageal cancer Neg Hx     ALLERGIES:  is allergic to [place] [zolpidem], bentyl [dicyclomine], carafate [sucralfate], lansoprazole, lexapro [escitalopram oxalate], other, pneumovax [pneumococcal polysaccharide vaccine], prilosec [omeprazole], remeron [mirtazapine], seroquel [quetiapine], statins, tizanidine, tramadol, and tylenol [acetaminophen].  MEDICATIONS:  Current Outpatient Medications  Medication Sig Dispense Refill   acetaminophen (TYLENOL) 500 MG tablet Take 500 mg by mouth every 6 (six) hours as needed.     amitriptyline (ELAVIL) 25 MG tablet Take 1 tablet by mouth at bedtime.     bismuth subsalicylate (PEPTO BISMOL) 262 MG/15ML suspension Take 30 mLs by mouth every 6 (six) hours as needed.     diclofenac Sodium (VOLTAREN ARTHRITIS PAIN) 1 % GEL Apply 2 g topically 2 (two) times daily as needed. Up to twice daily to right arm and low back (Patient not taking: Reported on 01/07/2024)     diphenoxylate-atropine (LOMOTIL) 2.5-0.025 MG tablet Take by mouth 4 (four) times daily as needed for diarrhea or loose stools. (Patient not taking: Reported on 01/07/2024)     lidocaine-prilocaine (EMLA) cream Apply 1 Application topically as needed. Apply to lower abdomen for burning pain (Patient not taking: Reported on 11/22/2023) 30 g 0   linaclotide (LINZESS) 72 MCG capsule Take 1 capsule (72 mcg total) by mouth daily before breakfast. (Patient not taking: Reported on 01/07/2024) 12 capsule 0   LORazepam (ATIVAN) 0.5 MG tablet TAKE 1 TABLET BY MOUTH 2 TIMES DAILY AS NEEDED FOR ANXIETY. 60 tablet 5   Menthol, Topical Analgesic, (BIOFREEZE EX) Apply topically.     nitroGLYCERIN (NITROSTAT) 0.4 MG SL tablet Place 1 tablet (0.4 mg total) under the tongue every 5 (five) minutes as needed for chest pain. 90 tablet 3   omega-3 acid ethyl esters (LOVAZA) 1 g capsule Take 1,250 mg by mouth daily. (Patient not taking: Reported on  11/22/2023)     pantoprazole (PROTONIX) 20 MG tablet Take 10 mg  by mouth daily.     Peppermint Oil (IBGARD) 90 MG CPCR Use as directed (Patient not taking: Reported on 01/07/2024)     traMADol (ULTRAM) 50 MG tablet Take 1 tablet (50 mg total) by mouth every 6 (six) hours as needed. 120 tablet 1   triamcinolone cream (KENALOG) 0.1 % Apply 1 Application topically 2 (two) times daily. (Patient not taking: Reported on 01/07/2024) 30 g 0   No current facility-administered medications for this visit.     PHYSICAL EXAMINATION: ECOG PERFORMANCE STATUS: 1 - Symptomatic but completely ambulatory  Vitals:   01/09/24 1341  BP: (!) 141/67  Pulse: 82  Resp: 17  Temp: 98 F (36.7 C)  SpO2: 99%    Filed Weights   01/09/24 1341  Weight: 174 lb 8 oz (79.2 kg)   GENERAL:alert, no distress and comfortable Neck: palpable lymphadenopathy Chest: CTA bilaterally Heart: RRR PSYCH: alert & oriented x 3 with fluent speech NEURO: no focal motor/sensory deficits  LABORATORY DATA:  I have reviewed the data as listed Lab Results  Component Value Date   WBC 8.6 01/09/2024   HGB 16.3 01/09/2024   HCT 48.6 01/09/2024   MCV 91.0 01/09/2024   PLT 269 01/09/2024     Chemistry      Component Value Date/Time   NA 134 (L) 10/09/2023 0928   NA 139 05/18/2022 1023   K 4.4 10/09/2023 0928   CL 102 10/09/2023 0928   CO2 25 10/09/2023 0928   BUN 17 10/09/2023 0928   BUN 22 05/18/2022 1023   CREATININE 1.41 10/09/2023 0928   CREATININE 1.43 (H) 07/11/2023 1610      Component Value Date/Time   CALCIUM 8.9 10/09/2023 0928   ALKPHOS 69 10/09/2023 0928   AST 15 10/09/2023 0928   AST 15 07/11/2023 1610   ALT 13 10/09/2023 0928   ALT 13 07/11/2023 1610   BILITOT 0.6 10/09/2023 0928   BILITOT 0.4 07/11/2023 1610     CBC from today shows complete resolution of the anemia , mild leukocytosis.  RADIOGRAPHIC STUDIES: I have personally reviewed the radiological images as listed and agreed with the findings  in the report. No results found.  All questions were answered. The patient knows to call the clinic with any problems, questions or concerns. I spent 30 minutes in the care of this patient including H and P, review of records, counseling and coordination of care.     Rachel Moulds, MD 01/09/2024 1:52 PM

## 2024-01-09 NOTE — Progress Notes (Signed)
 [facility] CONSULT NOTE  Patient Care Team: Corwin Levins, MD as PCP - General (Internal Medicine) O'Neal, Ronnald Ramp, MD as PCP - Cardiology (Cardiology) Corwin Levins, MD as Attending Physician (Internal Medicine) Jamison Neighbor, MD (Urology) Yvette Rack, OD as Consulting Physician (Optometry) Rachel Moulds, MD as Consulting Physician (Hematology and Oncology)  CHIEF COMPLAINTS/PURPOSE OF CONSULTATION:  Iron deficiency anemia  ASSESSMENT & PLAN:   Iron deficiency anemia due to chronic blood loss  This is a very pleasant 78 year old male patient with past medical history significant for GI bleed referred to hematology for further recommendations regarding his persistent iron deficiency and severe anemia.   Anemia Resolved. Hemoglobin 16.3, no recent episodes of bleeding. No current iron supplementation. -Continue current management. Await iron studies results.  Fatigue Likely secondary to medication side effects. No change from baseline. -Continue current management.  Rash New onset, location on arm and stomach. Etiology unclear. -Recommend evaluation by primary care or dermatology.  Cardiac Health Recent PET scan results satisfactory according to cardiology. -Continue current management.  Follow-up Given [name] condition, plan for annual follow-up with blood work.   No orders of the defined types were placed in this encounter. Total time spent: 30 minutes including history and physical, review of records, counseling and coordination of care  HISTORY OF PRESENTING ILLNESS:  Erik Cola Sr. 78 y.o. male is here because of Iron deficiency  Erik Cola Sr. "[name]" is a 78 year old male who presents for follow-up after treatment for severe anemia.  He was initially seen in February 2024 with severe anemia due to gastrointestinal bleeding, requiring blood transfusions and iron supplementation. His blood levels are now [name], with a hemoglobin of  16.3, and he is not currently taking iron supplements. No recent episodes of gastrointestinal bleeding, including no blood or black stools.  He experiences fatigue, which he attributes to his pain medication. He describes having 'good days and bad days' in terms of his overall well-being.  He has developed a rash around his arm and stomach, which appeared suddenly, but he is unsure of the cause.  He sees a urologist twice a year and reports no changes in his urological condition. He also sees a gastroenterologist and a primary care doctor regularly. He has been taking medication for diarrhea intermittently. He has seen a cardiologist and underwent a PET scan, which did not reveal any concerning findings. No swelling in his legs.  Rest of the pertinent 10 point ROS reviewed and neg.  MEDICAL HISTORY:  Past Medical History:  Diagnosis Date   Allergic rhinitis    Allergy    Anal fissure    Anemia    Anxiety    Blood transfusion without reported diagnosis 12/06/2022   also 2 in the past   Cataract    starting   Coronary atherosclerosis of native coronary artery 2007   nonobstructive CAD by cath with 40% mid-LAD stenosis   Degenerative arthritis of knee, bilateral 10/09/2017   Depression    Diverticulosis 12/18/2011   Diverticulosis of colon with hemorrhage    April 2021, [facility]   Duodenal mass    Fibromyalgia    GERD with stricture    Helicobacter pylori (H. 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   Vitamin D deficiency 11/29/2017    SURGICAL HISTORY: Past Surgical History:  Procedure Laterality Date   ADENOIDECTOMY     bladder distention     x 6   CARDIAC CATHETERIZATION     CATHETER REMOVAL     super pubic area   COLONOSCOPY     CORONARY STENT INTERVENTION N/A 05/29/2022   Procedure: CORONARY STENT INTERVENTION;  Surgeon: Swaziland, Peter M, MD;  Location: [facility] INVASIVE CV LAB;  Service: Cardiovascular;  Laterality: N/A;   DENTAL SURGERY Right    #30 tooth extraction (right upper)   ESOPHAGOGASTRODUODENOSCOPY  12/17/2022   INTERSTIM IMPLANT PLACEMENT     INTERSTIM IMPLANT REMOVAL     KNEE ARTHROSCOPY     right x 2   LEFT HEART CATH AND CORONARY ANGIOGRAPHY N/A 05/29/2022   Procedure: LEFT HEART CATH AND CORONARY ANGIOGRAPHY;  Surgeon: Swaziland, Peter M, MD;  Location: [facility] INVASIVE CV LAB;  Service: Cardiovascular;  Laterality: N/A;   NISSEN FUNDOPLICATION  2009   PROSTECTOMY     ROBOT ASSISTED LAPAROSCOPIC COMPLETE CYSTECT ILEAL CONDUIT     SHOULDER ARTHROSCOPY  2010   left   TONSILLECTOMY AND ADENOIDECTOMY  1957   TOTAL KNEE ARTHROPLASTY Right 09/08/2021   Procedure: TOTAL KNEE ARTHROPLASTY;  Surgeon: Jene Every, MD;  Location: [facility] ORS;  Service: Orthopedics;  Laterality: Right;   TRANSURETHRAL RESECTION OF PROSTATE     x 2   UPPER GASTROINTESTINAL ENDOSCOPY  05/13/2008   hiatal hernia   VASECTOMY      SOCIAL HISTORY: Social History   Socioeconomic History   Marital status: Married    Spouse name: Phillis   Number of children: 4   Years of education: Not on file   Highest education level: Not on file  Occupational History   Occupation: retired  Tobacco Use   Smoking status: Former    Passive exposure: Past   Smokeless tobacco: Never   Tobacco comments:    quit 1980  Vaping Use   Vaping status: Never Used  Substance and Sexual Activity   Alcohol use: No    Alcohol/week: 0.0 standard drinks of  alcohol   Drug use: No   Sexual activity: Yes    Partners: Female  Other Topics Concern   Not on file  Social History Narrative   Married, retired for children   He is a former smoker no alcohol or substance use      Lives with wife   Social Drivers of Corporate investment banker Strain: Low Risk  (01/07/2024)   Overall Financial Resource Strain (CARDIA)    Difficulty of Paying Living Expenses: Not very hard  Food Insecurity: No Food Insecurity (01/07/2024)   Hunger Vital Sign    Worried About Running Out of Food in the Last Year: Never true    Ran Out of Food in the Last Year: Never true  Transportation Needs: No Transportation Needs (01/07/2024)   PRAPARE - Administrator, Civil Service (Medical): No    Lack of Transportation (Non-Medical): No  Physical Activity: Insufficiently Active (01/07/2024)  Exercise Vital Sign    Days of Exercise per Week: 2 days    Minutes of Exercise per Session: 10 min  Stress: Stress Concern Present (01/07/2024)   [employer] of Occupational Health - Occupational Stress Questionnaire    Feeling of Stress : To some extent  Social Connections: Moderately Integrated (01/07/2024)   Social Connection and Isolation Panel [NHANES]    Frequency of Communication with Friends and Family: More than three times a week    Frequency of Social Gatherings with Friends and Family: More than three times a week    Attends Religious Services: More than 4 times per year    Active Member of [employer] or Organizations: No    Attends Banker Meetings: Never    Marital Status: Married  Catering manager Violence: Not At Risk (01/07/2024)   Humiliation, Afraid, Rape, and Kick questionnaire    Fear of Current or Ex-Partner: No    Emotionally Abused: No    Physically Abused: No    Sexually Abused: No    FAMILY HISTORY: Family History  Problem Relation Age of Onset   Allergic rhinitis Mother    Heart disease Mother    Hypertension Mother     Stroke Mother    Arthritis Mother    Colon polyps Father    Diabetes Father    Heart disease Father    Hypertension Father    Diabetes Sister    Breast cancer Sister    Liver cancer Maternal Grandfather    Diabetes Paternal Uncle    Liver cancer Paternal Uncle    Colon cancer Neg Hx    Esophageal cancer Neg Hx     ALLERGIES:  is allergic to [place] [zolpidem], bentyl [dicyclomine], carafate [sucralfate], lansoprazole, lexapro [escitalopram oxalate], other, pneumovax [pneumococcal polysaccharide vaccine], prilosec [omeprazole], remeron [mirtazapine], seroquel [quetiapine], statins, tizanidine, tramadol, and tylenol [acetaminophen].  MEDICATIONS:  Current Outpatient Medications  Medication Sig Dispense Refill   acetaminophen (TYLENOL) 500 MG tablet Take 500 mg by mouth every 6 (six) hours as needed.     amitriptyline (ELAVIL) 25 MG tablet Take 1 tablet by mouth at bedtime.     bismuth subsalicylate (PEPTO BISMOL) 262 MG/15ML suspension Take 30 mLs by mouth every 6 (six) hours as needed.     diclofenac Sodium (VOLTAREN ARTHRITIS PAIN) 1 % GEL Apply 2 g topically 2 (two) times daily as needed. Up to twice daily to right arm and low back (Patient not taking: Reported on 01/07/2024)     diphenoxylate-atropine (LOMOTIL) 2.5-0.025 MG tablet Take by mouth 4 (four) times daily as needed for diarrhea or loose stools. (Patient not taking: Reported on 01/07/2024)     lidocaine-prilocaine (EMLA) cream Apply 1 Application topically as needed. Apply to lower abdomen for burning pain (Patient not taking: Reported on 11/22/2023) 30 g 0   linaclotide (LINZESS) 72 MCG capsule Take 1 capsule (72 mcg total) by mouth daily before breakfast. (Patient not taking: Reported on 01/07/2024) 12 capsule 0   LORazepam (ATIVAN) 0.5 MG tablet TAKE 1 TABLET BY MOUTH 2 TIMES DAILY AS NEEDED FOR ANXIETY. 60 tablet 5   Menthol, Topical Analgesic, (BIOFREEZE EX) Apply topically.     nitroGLYCERIN (NITROSTAT) 0.4 MG SL tablet Place  1 tablet (0.4 mg total) under the tongue every 5 (five) minutes as needed for chest pain. 90 tablet 3   omega-3 acid ethyl esters (LOVAZA) 1 g capsule Take 1,250 mg by mouth daily. (Patient not taking: Reported on 11/22/2023)     pantoprazole (  PROTONIX) 20 MG tablet Take 10 mg by mouth daily.     Peppermint Oil (IBGARD) 90 MG CPCR Use as directed (Patient not taking: Reported on 01/07/2024)     traMADol (ULTRAM) 50 MG tablet Take 1 tablet (50 mg total) by mouth every 6 (six) hours as needed. 120 tablet 1   triamcinolone cream (KENALOG) 0.1 % Apply 1 Application topically 2 (two) times daily. (Patient not taking: Reported on 01/07/2024) 30 g 0   No current facility-administered medications for this visit.     PHYSICAL EXAMINATION: ECOG PERFORMANCE STATUS: 1 - Symptomatic but completely ambulatory  Vitals:   01/09/24 1341  BP: (!) 141/67  Pulse: 82  Resp: 17  Temp: 98 F (36.7 C)  SpO2: 99%    Filed Weights   01/09/24 1341  Weight: 174 lb 8 oz (79.2 kg)   GENERAL:alert, no distress and comfortable Neck: palpable lymphadenopathy Chest: CTA bilaterally Heart: RRR PSYCH: alert & oriented x 3 with fluent speech NEURO: no focal motor/sensory deficits  LABORATORY DATA:  I have reviewed the data as listed Lab Results  Component Value Date   WBC 8.6 01/09/2024   HGB 16.3 01/09/2024   HCT 48.6 01/09/2024   MCV 91.0 01/09/2024   PLT 269 01/09/2024     Chemistry      Component Value Date/Time   NA 134 (L) 10/09/2023 0928   NA 139 05/18/2022 1023   K 4.4 10/09/2023 0928   CL 102 10/09/2023 0928   CO2 25 10/09/2023 0928   BUN 17 10/09/2023 0928   BUN 22 05/18/2022 1023   CREATININE 1.41 10/09/2023 0928   CREATININE 1.43 (H) 07/11/2023 1610      Component Value Date/Time   CALCIUM 8.9 10/09/2023 0928   ALKPHOS 69 10/09/2023 0928   AST 15 10/09/2023 0928   AST 15 07/11/2023 1610   ALT 13 10/09/2023 0928   ALT 13 07/11/2023 1610   BILITOT 0.6 10/09/2023 0928   BILITOT 0.4  07/11/2023 1610     CBC from today shows complete resolution of the anemia ,   RADIOGRAPHIC STUDIES: I have personally reviewed the radiological images as listed and agreed with the findings in the report. No results found.  All questions were answered. The patient knows to call the clinic with any problems, questions or concerns. I spent 30 minutes in the care of this patient including H and P, review of records, counseling and coordination of care.     Rachel Moulds, MD 01/09/2024 1:54 PM

## 2024-01-13 DIAGNOSIS — H2513 Age-related nuclear cataract, bilateral: Secondary | ICD-10-CM | POA: Diagnosis not present

## 2024-01-13 DIAGNOSIS — H40003 Preglaucoma, unspecified, bilateral: Secondary | ICD-10-CM | POA: Diagnosis not present

## 2024-01-14 DIAGNOSIS — M5416 Radiculopathy, lumbar region: Secondary | ICD-10-CM | POA: Diagnosis not present

## 2024-01-17 DIAGNOSIS — M51369 Other intervertebral disc degeneration, lumbar region without mention of lumbar back pain or lower extremity pain: Secondary | ICD-10-CM | POA: Diagnosis not present

## 2024-01-23 ENCOUNTER — Ambulatory Visit: Admitting: Internal Medicine

## 2024-01-23 ENCOUNTER — Encounter: Payer: Self-pay | Admitting: Internal Medicine

## 2024-01-23 ENCOUNTER — Ambulatory Visit: Payer: Self-pay | Admitting: Internal Medicine

## 2024-01-23 VITALS — BP 120/64 | HR 81 | Temp 98.1°F | Ht 67.0 in | Wt 172.0 lb

## 2024-01-23 DIAGNOSIS — R21 Rash and other nonspecific skin eruption: Secondary | ICD-10-CM | POA: Diagnosis not present

## 2024-01-23 DIAGNOSIS — E559 Vitamin D deficiency, unspecified: Secondary | ICD-10-CM | POA: Diagnosis not present

## 2024-01-23 DIAGNOSIS — J309 Allergic rhinitis, unspecified: Secondary | ICD-10-CM

## 2024-01-23 DIAGNOSIS — L309 Dermatitis, unspecified: Secondary | ICD-10-CM | POA: Insufficient documentation

## 2024-01-23 MED ORDER — HYDROXYZINE HCL 25 MG PO TABS: ORAL_TABLET | ORAL | 2 refills | Status: AC

## 2024-01-23 MED ORDER — KETOCONAZOLE 2 % EX CREA: 1.0000 | TOPICAL_CREAM | Freq: Every day | CUTANEOUS | 1 refills | Status: DC

## 2024-01-23 MED ORDER — FLUCONAZOLE 100 MG PO TABS: 100.0000 mg | ORAL_TABLET | Freq: Every day | ORAL | 0 refills | Status: DC

## 2024-01-23 NOTE — Assessment & Plan Note (Signed)
 Last vitamin D Lab Results  Component Value Date   VD25OH 32.16 11/28/2022   Low, to start oral replacement

## 2024-01-23 NOTE — Assessment & Plan Note (Signed)
 Mild to mod, ok for steroid taper as he has at home,  to f/u any worsening symptoms or concerns

## 2024-01-23 NOTE — Telephone Encounter (Signed)
  Chief Complaint: rash to scrotal, groin and ostomy area Symptoms: red rash tiny bumps, linear 3-4 inches to scrotal and right groin area. Rash around ostomy site, severe itching.  "Looks bubbly". Burning pain. Rash starting to spread Frequency: 2 weeks  Pertinent Negatives: Patient denies chest pain no difficulty breathing no fever Disposition: [] ED /[] Urgent Care (no appt availability in office) / [x] Appointment(In office/virtual)/ []  [place] [facility]/ [] Home Care/ [] Refused Recommended Disposition /[] [place] [facility]/ []  Follow-up with PCP Additional Notes:   Appt scheduled today .     Copied from CRM [id number]. Topic: Clinical - Red Word Triage >> Jan 23, 2024  9:32 AM Theodis Sato wrote: Red Word that prompted transfer to Nurse Triage:  New rash around scrotum/ anus area with extreme itching Reason for Disposition  [1] Looks infected (spreading redness, pus) AND [2] large red area (> 2 in. or 5 cm)  Answer Assessment - Initial Assessment Questions 1. APPEARANCE of RASH: "Describe the rash."      Red tiny bumps  2. LOCATION: "Where is the rash located?"      Scrotal area/ groin area right side  rash 3- 4 inches long around ostomy right side  3. NUMBER: "How many spots are there?"      Runs 3-4 inches and along ostomy ,  bubbly looking 4. SIZE: "How big are the spots?" (Inches, centimeters or compare to size of a coin)      Linear  5. ONSET: "When did the rash start?"      2 weeks  6. ITCHING: "Does the rash itch?" If Yes, ask: "How bad is the itch?"  (Scale 0-10; or none, mild, moderate, severe)     Severe  7. PAIN: "Does the rash hurt?" If Yes, ask: "How bad is the pain?"  (Scale 0-10; or none, mild, moderate, severe)    - NONE (0): no pain    - MILD (1-3): doesn't interfere with normal activities     - MODERATE (4-7): interferes with normal activities or awakens from sleep     - SEVERE (8-10): excruciating pain, unable to do any normal activities     Burning pain  not sleeping , pain level 3-4/10  8. OTHER SYMPTOMS: "Do you have any other symptoms?" (e.g., fever)     No  9. PREGNANCY: "Is there any chance you are pregnant?" "When was your last menstrual period?"     na  Protocols used: Rash or Redness - Localized-A-AH

## 2024-01-23 NOTE — Progress Notes (Signed)
 Patient ID: Erik Cola Sr., male   DOB: 1946/04/04, 78 y.o.   MRN: [id number]        Chief Complaint: follow up stoma rash, groin rash, allergies, low vit d       HPI:  Erik RIVA Sr. is a 78 y.o. male here with 1 wk onset redness and pain under the stoma dressing gradually worsening. Also with less painful rash to the groin.  Pt denies chest pain, increased sob or doe, wheezing, orthopnea, PND, increased LE swelling, palpitations, dizziness or syncope.   Pt denies polydipsia, polyuria, or new focal neuro s/s.   Denies worsening reflux, abd pain, dysphagia, n/v, bowel change or blood.   Has contacted [facility] but they have not gotten back to him. Does have several wks ongoing nasal allergy symptoms with clearish congestion, itch and sneezing, without fever, pain, ST, cough, swelling or wheezing.  Did also see ortho recently for right knee arthritis, and has a steroid taper pack at home that he is about to start.         Wt Readings from Last 3 Encounters:  01/23/24 172 lb (78 kg)  01/09/24 174 lb 8 oz (79.2 kg)  01/07/24 176 lb (79.8 kg)   BP Readings from Last 3 Encounters:  01/23/24 120/64  01/09/24 (!) 141/67  12/06/23 122/76         Past Medical History:  Diagnosis Date   Allergic rhinitis    Allergy    Anal fissure    Anemia    Anxiety    Blood transfusion without reported diagnosis 12/06/2022   also 2 in the past   Cataract    starting   Coronary atherosclerosis of native coronary artery 2007   nonobstructive CAD by cath with 40% mid-LAD stenosis   Degenerative arthritis of knee, bilateral 10/09/2017   Depression    Diverticulosis 12/18/2011   Diverticulosis of colon with hemorrhage    April 2021, [facility] healthcare   Duodenal mass    Fibromyalgia    GERD with stricture    Helicobacter pylori (H. 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   Vitamin D deficiency 11/29/2017   Past Surgical History:  Procedure Laterality Date   ADENOIDECTOMY     bladder distention     x 6   CARDIAC CATHETERIZATION     CATHETER REMOVAL     super pubic area   COLONOSCOPY     CORONARY STENT INTERVENTION N/A 05/29/2022   Procedure: CORONARY STENT INTERVENTION;  Surgeon: Swaziland, Peter M, MD;  Location: [facility] INVASIVE CV LAB;  Service: Cardiovascular;  Laterality: N/A;   DENTAL SURGERY Right    #30 tooth extraction (right upper)   ESOPHAGOGASTRODUODENOSCOPY  12/17/2022   INTERSTIM IMPLANT PLACEMENT     INTERSTIM IMPLANT REMOVAL     KNEE ARTHROSCOPY     right x 2  LEFT HEART CATH AND CORONARY ANGIOGRAPHY N/A 05/29/2022   Procedure: LEFT HEART CATH AND CORONARY ANGIOGRAPHY;  Surgeon: Swaziland, Peter M, MD;  Location: [facility] INVASIVE CV LAB;  Service: Cardiovascular;  Laterality: N/A;   NISSEN FUNDOPLICATION  2009   PROSTECTOMY     ROBOT ASSISTED LAPAROSCOPIC COMPLETE CYSTECT ILEAL CONDUIT     SHOULDER ARTHROSCOPY  2010   left   TONSILLECTOMY AND ADENOIDECTOMY  1957   TOTAL KNEE ARTHROPLASTY Right 09/08/2021   Procedure: TOTAL KNEE ARTHROPLASTY;  Surgeon: Jene Every, MD;  Location: [facility] ORS;  Service: Orthopedics;  Laterality: Right;   TRANSURETHRAL RESECTION OF PROSTATE     x 2   UPPER GASTROINTESTINAL ENDOSCOPY  05/13/2008   hiatal hernia   VASECTOMY      reports that he has quit smoking. He has been exposed to tobacco smoke. He has never used smokeless tobacco. He reports that he does not drink alcohol and does not use drugs. family  history includes Allergic rhinitis in his mother; Arthritis in his mother; Breast cancer in his sister; Colon polyps in his father; Diabetes in his father, paternal uncle, and sister; Heart disease in his father and mother; Hypertension in his father and mother; Liver cancer in his maternal grandfather and paternal uncle; Stroke in his mother. Allergies  Allergen Reactions   Ambien [Zolpidem]     Unknown reaction   Bentyl [Dicyclomine]     Memory loss, anxiety, blurred vision   Carafate [Sucralfate] Nausea And Vomiting   Lansoprazole Diarrhea   Lexapro [Escitalopram Oxalate]     Unknown reaction    Other     Pt states he is sensitive to all oral medications   Pneumovax [Pneumococcal Polysaccharide Vaccine] Other (See Comments)    pain   Prilosec [Omeprazole] Other (See Comments)    Per patient joint pain with PPI's   Remeron [Mirtazapine]     Unknown reaction    Seroquel [Quetiapine] Other (See Comments)    Unknown reaction    Statins Other (See Comments)    Joint pain   Tizanidine Other (See Comments)    Made patient "feel crazy"   Tramadol Other (See Comments)    Pt can take small amounts up to twice daily but continued use causes chest / abdominal pain   Tylenol [Acetaminophen] Nausea And Vomiting    Can only take for 2 takes then he starts to have pain   Current Outpatient Medications on File Prior to Visit  Medication Sig Dispense Refill   acetaminophen (TYLENOL) 500 MG tablet Take 500 mg by mouth every 6 (six) hours as needed.     amitriptyline (ELAVIL) 25 MG tablet Take 1 tablet by mouth at bedtime.     bismuth subsalicylate (PEPTO BISMOL) 262 MG/15ML suspension Take 30 mLs by mouth every 6 (six) hours as needed.     LORazepam (ATIVAN) 0.5 MG tablet TAKE 1 TABLET BY MOUTH 2 TIMES DAILY AS NEEDED FOR ANXIETY. 60 tablet 5   Menthol, Topical Analgesic, (BIOFREEZE EX) Apply topically.     pantoprazole (PROTONIX) 20 MG tablet Take 10 mg by mouth daily.     traMADol (ULTRAM) 50  MG tablet Take 1 tablet (50 mg total) by mouth every 6 (six) hours as needed. 120 tablet 1   nitroGLYCERIN (NITROSTAT) 0.4 MG SL tablet Place 1 tablet (0.4 mg total) under the tongue every 5 (five) minutes as needed for chest pain. 90 tablet 3   Peppermint Oil (IBGARD) 90 MG CPCR Use as directed (Patient not  taking: Reported on 01/23/2024)     No current facility-administered medications on file prior to visit.        ROS:  All others reviewed and negative.  Objective        PE:  BP 120/64 (BP Location: Left Arm, Patient Position: Sitting, Cuff Size: Normal)   Pulse 81   Temp 98.1 F (36.7 C) (Oral)   Ht 5\' 7"  (1.702 m)   Wt 172 lb (78 kg)   SpO2 95%   BMI 26.94 kg/m                 Constitutional: Pt appears in NAD               HENT: Head: NCAT.                Right Ear: External ear normal.                 Left Ear: External ear normal.                Eyes: . Pupils are equal, round, and reactive to light. Conjunctivae and EOM are normal               Nose: without d/c or deformity               Neck: Neck supple. Gross normal ROM               Cardiovascular: Normal rate and regular rhythm.                 Pulmonary/Chest: Effort normal and breath sounds without rales or wheezing.                Abd:  Soft, NT, ND, + BS, no organomegaly; stoma to RLQ               Neurological: Pt is alert. At baseline orientation, motor grossly intact               Skin: Skin is warm. LE edema - none; mild tender erythema under stoma dressing and lesser tender to bilateral groin areas               Psychiatric: Pt behavior is normal without agitation   Micro: none  Cardiac tracings I have personally interpreted today:  none  Pertinent Radiological findings (summarize): none   Lab Results  Component Value Date   WBC 8.6 01/09/2024   HGB 16.3 01/09/2024   HCT 48.6 01/09/2024   PLT 269 01/09/2024   GLUCOSE 108 (H) 10/09/2023   CHOL 222 (H) 10/09/2023   TRIG 287.0 (H) 10/09/2023   HDL  38.10 (L) 10/09/2023   LDLDIRECT 169.0 06/24/2018   LDLCALC 126 (H) 10/09/2023   ALT 13 10/09/2023   AST 15 10/09/2023   NA 134 (L) 10/09/2023   K 4.4 10/09/2023   CL 102 10/09/2023   CREATININE 1.41 10/09/2023   BUN 17 10/09/2023   CO2 25 10/09/2023   TSH 3.30 10/09/2023   PSA 0.00 (L) 11/28/2022   HGBA1C 6.0 10/09/2023   Assessment/Plan:  Erik Cola Sr. is a 79 y.o. White or Caucasian [1] male with  has a past medical history of Allergic rhinitis, Allergy, Anal fissure, Anemia, Anxiety, Blood transfusion without reported diagnosis (12/06/2022), Cataract, Coronary atherosclerosis of native coronary artery (2007), Degenerative arthritis of knee, bilateral (10/09/2017), Depression, Diverticulosis (12/18/2011), Diverticulosis of colon with hemorrhage, Duodenal mass, Fibromyalgia, GERD with stricture, Helicobacter pylori (H.  pylori) infection (11/25/2012), Hiatal hernia, HLD (hyperlipidemia), HTN (hypertension), Hyperlipidemia, IBS (irritable bowel syndrome), Impotence of organic origin, Insomnia, Internal and external hemorrhoids without complication, Interstitial cystitis, Irritable bowel syndrome (06/09/2010), Kidney stone, Osteoporosis, Pelvic floor dysfunction (11/29/2017), Pneumonia, Reactive hypoglycemia, Rheumatoid arthritis(714.0), Somatization disorder (02/27/2012), and Vitamin D deficiency (11/29/2017).  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  Oliver Barre, MD 01/23/2024 4:59 PM [facility]

## 2024-01-23 NOTE — Assessment & Plan Note (Signed)
 Mild to mod, for ketoconozole cr asd,  to f/u any worsening symptoms or concerns

## 2024-01-23 NOTE — Assessment & Plan Note (Signed)
 Mild to mod, for antibx course diflucan 100 every day x 7d,  to f/u any worsening symptoms or concerns

## 2024-01-23 NOTE — Patient Instructions (Signed)
 Please take all new medication as prescribed - the diflucan 100 mg per day for 7 days only for the stoma and groin rashes  Please take all new medication as prescribed - the cream as well if needed  Please take all new medication as prescribed  - the atarax as needed for itching, or sleep  Please also take the steroid pack given to you by orthopedic as the stoma rash could be irritation there as well  Please continue all other medications as before, and refills have been done if requested.  Please have the pharmacy call with any other refills you may need.  Please keep your appointments with your specialists as you may have planned

## 2024-01-29 ENCOUNTER — Encounter: Payer: Self-pay | Admitting: Hematology and Oncology

## 2024-01-29 DIAGNOSIS — M6281 Muscle weakness (generalized): Secondary | ICD-10-CM | POA: Diagnosis not present

## 2024-01-29 DIAGNOSIS — M62838 Other muscle spasm: Secondary | ICD-10-CM | POA: Diagnosis not present

## 2024-01-29 DIAGNOSIS — M545 Low back pain, unspecified: Secondary | ICD-10-CM | POA: Diagnosis not present

## 2024-01-30 ENCOUNTER — Ambulatory Visit (INDEPENDENT_AMBULATORY_CARE_PROVIDER_SITE_OTHER): Payer: Medicare Other | Admitting: Internal Medicine

## 2024-01-30 ENCOUNTER — Encounter: Payer: Self-pay | Admitting: Internal Medicine

## 2024-01-30 VITALS — BP 128/66 | HR 65 | Ht 67.0 in | Wt 174.0 lb

## 2024-01-30 DIAGNOSIS — M6289 Other specified disorders of muscle: Secondary | ICD-10-CM | POA: Diagnosis not present

## 2024-01-30 DIAGNOSIS — K580 Irritable bowel syndrome with diarrhea: Secondary | ICD-10-CM | POA: Diagnosis not present

## 2024-01-30 DIAGNOSIS — N3010 Interstitial cystitis (chronic) without hematuria: Secondary | ICD-10-CM | POA: Diagnosis not present

## 2024-01-30 DIAGNOSIS — Z9889 Other specified postprocedural states: Secondary | ICD-10-CM

## 2024-01-30 NOTE — Patient Instructions (Signed)
 Consider doing Acupuncture.   _______________________________________________________  If your blood pressure at your visit was 140/90 or greater, please contact your primary care physician to follow up on this.  _______________________________________________________  If you are age 77 or older, your body mass index should be between 23-30. Your Body mass index is 27.25 kg/m. If this is out of the aforementioned range listed, please consider follow up with your Primary Care Provider.  If you are age 39 or younger, your body mass index should be between 19-25. Your Body mass index is 27.25 kg/m. If this is out of the aformentioned range listed, please consider follow up with your Primary Care Provider.   ________________________________________________________  The [facility] providers would like to encourage you to use [facility] to communicate with providers for non-urgent requests or questions.  Due to long hold times on the telephone, sending your provider a message by [facility] may be a faster and more efficient way to get a response.  Please allow 48 business hours for a response.  Please remember that this is for non-urgent requests.  _______________________________________________________  I appreciate the opportunity to care for you. Stan Head, MD, [facility]

## 2024-01-30 NOTE — Progress Notes (Signed)
 Erik SPIVAK Sr. 77 y.o. [birth year] [id number]  Assessment & Plan:   Encounter Diagnoses  Name Primary?   High-tone pelvic floor dysfunction Yes   History of ileal conduit    Irritable bowel syndrome with diarrhea    Interstitial cystitis     Continued visceral complaints presumably from pelvic floor dysfunction. Has failed numerous medications and other therapies (OPT - can help but not sustained). Numerous drug intolerances.  I do not think repeat or additional testing would be helpful;.  I have recommended he give accupuncture or possibly hypnosis a try.  Also, perhaps he can determine a way to get more consistent and better rest.  Subjective:  Discussed the use of AI scribe software for clinical note transcription with the patient, who gave verbal consent to proceed. Chief Complaint: IBS  HPI 78 year old man with a complicated medical history including interstitial cystitis and high tone pelvic floor dysfunction status post cystectomy and ileal conduit/ureterostomy, he also has IBS-diarrhea predominant.   Saw Dr. Leonides Schanz in January and she wondered if he had overflow diarrhea and suggested prune juice and kiwi fruit. May have heled slightly but prune juice was irritating. He may have even tried Linzess.   The patient presents with chronic bowel discomfort and diarrhea. He was referred by his primary care doctor to the pain clinic and by his counselor to the psychiatrist.  He experiences chronic bowel discomfort and diarrhea, often waking with bowel discomfort that necessitates immediate evacuation for relief. Typically, he has another bowel movement within 1.5 to 2 hours, which is softer and consists of small pieces, described as 'hush puppy like'. On some days, he has up to eight stools per day, with loose stools causing skin irritation. Notably, on days with a good night's rest, these symptoms are absent.  He has a history of interstitial cystitis and pelvic floor  dysfunction since 1986, following a nervous breakdown. He underwent a Nissen fundoplication in 2013. He has had a cystectomy and ileal conduit and wonders if that is causing gastrointestinal trauma in the pelvic floor. He reports that COVID infusions exacerbated his symptoms, leading to hospitalization and bleeding. He also had a heart stent placed and is on Plavix and aspirin, which he believes have contributed to his issues.  He experiences extreme heartburn and pain spasms, which he associates with medication use. He uses various medications to manage his symptoms, including lorazepam, Benadryl, Flexeril, amitriptyline, and Tylenol, rotating them frequently. He has also used 5 mg Valium suppositories, which provided some relief but also caused heartburn. He mentions being sensitive to medications and has experienced adverse effects from many treatments.  He has tried pelvic floor therapy, which he resumed recently, and dietary changes such as consuming kiwi fruit and prune juice, which he feels have slightly improved his stool frequency. He continues to manage his symptoms by rotating medications and incorporating lifestyle changes like hot tub baths and exercise.  He notes that a good night's sleep alleviates his symptoms significantly. Sleep disturbance is due to bowel irritation and medication side effects. Previous medications for sleep include Valium suppositories, lorazepam, Benadryl, Flexeril, and amitriptyline.       Wt Readings from Last 3 Encounters:  01/30/24 174 lb (78.9 kg)  01/23/24 172 lb (78 kg)  01/09/24 174 lb 8 oz (79.2 kg)    EGD 01/01/2023 with gastritis, fundoplication intact, duodenal biopsies-no celiac disease  Colonoscopy 09/04/22 [facility] Findings:      The perianal and digital rectal examinations were normal. Pertinent  negatives include normal sphincter tone and no palpable rectal lesions.       Non-bleeding internal hemorrhoids were found during retroflexion. The        hemorrhoids were medium-sized and Grade I (internal hemorrhoids that do       not prolapse).       The terminal ileum appeared normal and was examined up to 10 cm.       The exam was otherwise without abnormality on direct and retroflexion       views.       Many small and large-mouthed diverticula were found in the left colon.      Allergies  Allergen Reactions   Ambien [Zolpidem]     Unknown reaction   Bentyl [Dicyclomine]     Memory loss, anxiety, blurred vision   Carafate [Sucralfate] Nausea And Vomiting   Lansoprazole Diarrhea   Lexapro [Escitalopram Oxalate]     Unknown reaction    Other     Pt states he is sensitive to all oral medications   Pneumovax [Pneumococcal Polysaccharide Vaccine] Other (See Comments)    pain   Prilosec [Omeprazole] Other (See Comments)    Per patient joint pain with PPI's   Remeron [Mirtazapine]     Unknown reaction    Seroquel [Quetiapine] Other (See Comments)    Unknown reaction    Statins Other (See Comments)    Joint pain   Tizanidine Other (See Comments)    Made patient "feel crazy"   Tramadol Other (See Comments)    Pt can take small amounts up to twice daily but continued use causes chest / abdominal pain   Tylenol [Acetaminophen] Nausea And Vomiting    Can only take for 2 takes then he starts to have pain   Current Meds  Medication Sig   acetaminophen (TYLENOL) 500 MG tablet Take 500 mg by mouth every 6 (six) hours as needed.   amitriptyline (ELAVIL) 25 MG tablet Take 1 tablet by mouth at bedtime.   bismuth subsalicylate (PEPTO BISMOL) 262 MG/15ML suspension Take 30 mLs by mouth every 6 (six) hours as needed.   fluconazole (DIFLUCAN) 100 MG tablet Take 1 tablet (100 mg total) by mouth daily.   hydrOXYzine (ATARAX) 25 MG tablet 1-2 tabs by mouth as needed for itching or sleep   ketoconazole (NIZORAL) 2 % cream Apply 1 Application topically daily.   LORazepam (ATIVAN) 0.5 MG tablet TAKE 1 TABLET BY MOUTH 2 TIMES DAILY AS  NEEDED FOR ANXIETY.   Menthol, Topical Analgesic, (BIOFREEZE EX) Apply topically.   traMADol (ULTRAM) 50 MG tablet Take 1 tablet (50 mg total) by mouth every 6 (six) hours as needed.   Past Medical History:  Diagnosis Date   Allergic rhinitis    Allergy    Anal fissure    Anemia    Anxiety    Blood transfusion without reported diagnosis 12/06/2022   also 2 in the past   Cataract    starting   Coronary atherosclerosis of native coronary artery 2007   nonobstructive CAD by cath with 40% mid-LAD stenosis   Degenerative arthritis of knee, bilateral 10/09/2017   Depression    Diverticulosis 12/18/2011   Diverticulosis of colon with hemorrhage    April 2021, [facility] healthcare   Duodenal mass    Fibromyalgia    GERD with stricture    Helicobacter pylori (H. 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   Vitamin D deficiency 11/29/2017   Past Surgical History:  Procedure Laterality Date   ADENOIDECTOMY     bladder distention     x 6   CARDIAC CATHETERIZATION     CATHETER REMOVAL     super pubic area   COLONOSCOPY     CORONARY STENT INTERVENTION N/A 05/29/2022   Procedure: CORONARY STENT INTERVENTION;  Surgeon: Swaziland, Peter M, MD;  Location: [facility] INVASIVE CV LAB;  Service: Cardiovascular;  Laterality: N/A;   DENTAL SURGERY Right    #30 tooth extraction (right upper)   ESOPHAGOGASTRODUODENOSCOPY  12/17/2022   INTERSTIM IMPLANT PLACEMENT     INTERSTIM IMPLANT  REMOVAL     KNEE ARTHROSCOPY     right x 2   LEFT HEART CATH AND CORONARY ANGIOGRAPHY N/A 05/29/2022   Procedure: LEFT HEART CATH AND CORONARY ANGIOGRAPHY;  Surgeon: Swaziland, Peter M, MD;  Location: [facility] INVASIVE CV LAB;  Service: Cardiovascular;  Laterality: N/A;   NISSEN FUNDOPLICATION  2009   PROSTECTOMY     ROBOT ASSISTED LAPAROSCOPIC COMPLETE CYSTECT ILEAL CONDUIT     SHOULDER ARTHROSCOPY  2010   left   TONSILLECTOMY AND ADENOIDECTOMY  1957   TOTAL KNEE ARTHROPLASTY Right 09/08/2021   Procedure: TOTAL KNEE ARTHROPLASTY;  Surgeon: Jene Every, MD;  Location: [facility] OR[facility];  Service: Orthopedics;  Laterality: Right;   TRANSURETHRAL RESECTION OF PROSTATE     x 2   UPPER GASTROINTESTINAL ENDOSCOPY  05/13/2008   hiatal hernia   VASECTOMY     Social History   Social History Narrative   Married, retired for children   He is a former smoker no alcohol or substance use      Lives with wife   family history includes Allergic rhinitis in his mother; Arthritis in his mother; Breast cancer in his sister; Colon polyps in his father; Diabetes in his father, paternal uncle, and sister; Heart disease in his father and mother; Hypertension in his father and mother; Liver cancer in his maternal grandfather and paternal uncle; Stroke in his mother and sister.   Review of Systems As per HPI  Objective:   Physical Exam BP 128/66   Pulse 65   Ht 5\' 7"  (1.702 m)   Wt 174 lb (78.9 kg)   BMI 27.25 kg/m  NAD Abd soft and NT w/ RLQ ileal conduit ostomy

## 2024-01-31 DIAGNOSIS — M6281 Muscle weakness (generalized): Secondary | ICD-10-CM | POA: Diagnosis not present

## 2024-01-31 DIAGNOSIS — M545 Low back pain, unspecified: Secondary | ICD-10-CM | POA: Diagnosis not present

## 2024-01-31 DIAGNOSIS — M25661 Stiffness of right knee, not elsewhere classified: Secondary | ICD-10-CM | POA: Diagnosis not present

## 2024-02-04 DIAGNOSIS — M545 Low back pain, unspecified: Secondary | ICD-10-CM | POA: Diagnosis not present

## 2024-02-04 DIAGNOSIS — M6281 Muscle weakness (generalized): Secondary | ICD-10-CM | POA: Diagnosis not present

## 2024-02-04 DIAGNOSIS — M25661 Stiffness of right knee, not elsewhere classified: Secondary | ICD-10-CM | POA: Diagnosis not present

## 2024-02-06 DIAGNOSIS — M25661 Stiffness of right knee, not elsewhere classified: Secondary | ICD-10-CM | POA: Diagnosis not present

## 2024-02-06 DIAGNOSIS — M545 Low back pain, unspecified: Secondary | ICD-10-CM | POA: Diagnosis not present

## 2024-02-06 DIAGNOSIS — M6281 Muscle weakness (generalized): Secondary | ICD-10-CM | POA: Diagnosis not present

## 2024-02-10 DIAGNOSIS — M545 Low back pain, unspecified: Secondary | ICD-10-CM | POA: Diagnosis not present

## 2024-02-10 DIAGNOSIS — M25661 Stiffness of right knee, not elsewhere classified: Secondary | ICD-10-CM | POA: Diagnosis not present

## 2024-02-10 DIAGNOSIS — M6281 Muscle weakness (generalized): Secondary | ICD-10-CM | POA: Diagnosis not present

## 2024-02-12 DIAGNOSIS — M6281 Muscle weakness (generalized): Secondary | ICD-10-CM | POA: Diagnosis not present

## 2024-02-12 DIAGNOSIS — M62838 Other muscle spasm: Secondary | ICD-10-CM | POA: Diagnosis not present

## 2024-02-18 DIAGNOSIS — M25661 Stiffness of right knee, not elsewhere classified: Secondary | ICD-10-CM | POA: Diagnosis not present

## 2024-02-18 DIAGNOSIS — M545 Low back pain, unspecified: Secondary | ICD-10-CM | POA: Diagnosis not present

## 2024-02-18 DIAGNOSIS — M6281 Muscle weakness (generalized): Secondary | ICD-10-CM | POA: Diagnosis not present

## 2024-02-19 DIAGNOSIS — M6281 Muscle weakness (generalized): Secondary | ICD-10-CM | POA: Diagnosis not present

## 2024-02-19 DIAGNOSIS — M62838 Other muscle spasm: Secondary | ICD-10-CM | POA: Diagnosis not present

## 2024-02-19 DIAGNOSIS — M545 Low back pain, unspecified: Secondary | ICD-10-CM | POA: Diagnosis not present

## 2024-02-21 DIAGNOSIS — M6281 Muscle weakness (generalized): Secondary | ICD-10-CM | POA: Diagnosis not present

## 2024-02-21 DIAGNOSIS — M25661 Stiffness of right knee, not elsewhere classified: Secondary | ICD-10-CM | POA: Diagnosis not present

## 2024-02-21 DIAGNOSIS — M545 Low back pain, unspecified: Secondary | ICD-10-CM | POA: Diagnosis not present

## 2024-02-27 DIAGNOSIS — M545 Low back pain, unspecified: Secondary | ICD-10-CM | POA: Diagnosis not present

## 2024-02-27 DIAGNOSIS — M6281 Muscle weakness (generalized): Secondary | ICD-10-CM | POA: Diagnosis not present

## 2024-02-27 DIAGNOSIS — M25661 Stiffness of right knee, not elsewhere classified: Secondary | ICD-10-CM | POA: Diagnosis not present

## 2024-03-02 DIAGNOSIS — M6281 Muscle weakness (generalized): Secondary | ICD-10-CM | POA: Diagnosis not present

## 2024-03-02 DIAGNOSIS — M25661 Stiffness of right knee, not elsewhere classified: Secondary | ICD-10-CM | POA: Diagnosis not present

## 2024-03-02 DIAGNOSIS — M545 Low back pain, unspecified: Secondary | ICD-10-CM | POA: Diagnosis not present

## 2024-03-03 DIAGNOSIS — M6281 Muscle weakness (generalized): Secondary | ICD-10-CM | POA: Diagnosis not present

## 2024-03-03 DIAGNOSIS — M62838 Other muscle spasm: Secondary | ICD-10-CM | POA: Diagnosis not present

## 2024-03-03 DIAGNOSIS — M545 Low back pain, unspecified: Secondary | ICD-10-CM | POA: Diagnosis not present

## 2024-03-04 DIAGNOSIS — M25661 Stiffness of right knee, not elsewhere classified: Secondary | ICD-10-CM | POA: Diagnosis not present

## 2024-03-04 DIAGNOSIS — M6281 Muscle weakness (generalized): Secondary | ICD-10-CM | POA: Diagnosis not present

## 2024-03-04 DIAGNOSIS — M545 Low back pain, unspecified: Secondary | ICD-10-CM | POA: Diagnosis not present

## 2024-03-05 ENCOUNTER — Ambulatory Visit (INDEPENDENT_AMBULATORY_CARE_PROVIDER_SITE_OTHER): Admitting: Internal Medicine

## 2024-03-05 ENCOUNTER — Encounter: Payer: Self-pay | Admitting: Internal Medicine

## 2024-03-05 VITALS — BP 130/74 | HR 88 | Temp 98.1°F | Ht 67.0 in | Wt 170.0 lb

## 2024-03-05 DIAGNOSIS — N1831 Chronic kidney disease, stage 3a: Secondary | ICD-10-CM | POA: Insufficient documentation

## 2024-03-05 DIAGNOSIS — R509 Fever, unspecified: Secondary | ICD-10-CM | POA: Insufficient documentation

## 2024-03-05 DIAGNOSIS — F341 Dysthymic disorder: Secondary | ICD-10-CM | POA: Diagnosis not present

## 2024-03-05 DIAGNOSIS — E559 Vitamin D deficiency, unspecified: Secondary | ICD-10-CM

## 2024-03-05 MED ORDER — LEVOFLOXACIN 500 MG PO TABS: 500.0000 mg | ORAL_TABLET | Freq: Every day | ORAL | 0 refills | Status: DC

## 2024-03-05 MED ORDER — CEFTRIAXONE SODIUM 1 G IJ SOLR
1.0000 g | Freq: Once | INTRAMUSCULAR | Status: AC
  Administered 2024-03-05: 1 g via INTRAMUSCULAR

## 2024-03-05 NOTE — Progress Notes (Signed)
 Patient ID: Erik Nim Sr., male   DOB: June 18, 1946, 78 y.o.   MRN: [id number]        Chief Complaint: follow up febrile illness, cough, abd pain      HPI:  here with feeling poorly for 3 days getting worse,  Has moderate to severe fatigue over baseline; feveirsh and sweaty last pm, has mild non prod cough but Pt denies chest pain, increased sob or doe, wheezing, orthopnea, PND, increased LE swelling, palpitations, dizziness or syncope.    Pt denies polydipsia, polyuria, or new focal neuro s/s.   Does have moderate worsening burning abd pain, seems to be localized more about the area just beneath the stoma site intraabdomiinal; has some nausea, no vomiting or bowel change.  Denies worsening depressive symptoms, suicidal ideation, or panic  Wt Readings from Last 3 Encounters:  03/05/24 170 lb (77.1 kg)  01/30/24 174 lb (78.9 kg)  01/23/24 172 lb (78 kg)   BP Readings from Last 3 Encounters:  03/05/24 130/74  01/30/24 128/66  01/23/24 120/64         Past Medical History:  Diagnosis Date   Allergic rhinitis    Allergy     Anal fissure    Anemia    Anxiety    Blood transfusion without reported diagnosis 12/06/2022   also 2 in the past   Cataract    starting   Coronary atherosclerosis of native coronary artery 2007   nonobstructive CAD by cath with 40% mid-LAD stenosis   Degenerative arthritis of knee, bilateral 10/09/2017   Depression    Diverticulosis 12/18/2011   Diverticulosis of colon with hemorrhage    April 2021, [facility] healthcare   Duodenal mass    Fibromyalgia    GERD with stricture    Helicobacter pylori (H. 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   Vitamin D  deficiency 11/29/2017   Past Surgical History:  Procedure Laterality Date   ADENOIDECTOMY     bladder distention     x 6   CARDIAC CATHETERIZATION     CATHETER REMOVAL     super pubic area   COLONOSCOPY     CORONARY STENT INTERVENTION N/A 05/29/2022   Procedure: CORONARY STENT INTERVENTION;  Surgeon: Swaziland, Peter M, MD;  Location: [facility] INVASIVE CV LAB;  Service: Cardiovascular;  Laterality: N/A;   DENTAL SURGERY Right    #30 tooth extraction (right upper)   ESOPHAGOGASTRODUODENOSCOPY  12/17/2022   INTERSTIM IMPLANT PLACEMENT     INTERSTIM IMPLANT REMOVAL     KNEE ARTHROSCOPY     right x 2   LEFT HEART CATH AND CORONARY ANGIOGRAPHY N/A 05/29/2022   Procedure: LEFT HEART CATH AND CORONARY ANGIOGRAPHY;  Surgeon: Swaziland, Peter M, MD;  Location: [facility] INVASIVE CV LAB;  Service: Cardiovascular;  Laterality: N/A;   NISSEN FUNDOPLICATION  2009   PROSTECTOMY     ROBOT ASSISTED  LAPAROSCOPIC COMPLETE CYSTECT ILEAL CONDUIT     SHOULDER ARTHROSCOPY  2010   left   TONSILLECTOMY AND ADENOIDECTOMY  1957   TOTAL KNEE ARTHROPLASTY Right 09/08/2021   Procedure: TOTAL KNEE ARTHROPLASTY;  Surgeon: Orvan Blanch, MD;  Location: [facility] ORS;  Service: Orthopedics;  Laterality: Right;   TRANSURETHRAL RESECTION OF PROSTATE     x 2   UPPER GASTROINTESTINAL ENDOSCOPY  05/13/2008   hiatal hernia   VASECTOMY      reports that he has quit smoking. He has been exposed to tobacco smoke. He has never used smokeless tobacco. He reports that he does not drink alcohol  and does not use drugs. family history includes Allergic rhinitis in his mother; Arthritis in his mother; Breast cancer in his sister; Colon polyps in his father; Diabetes in his father, paternal uncle, and sister; Heart disease in his  father and mother; Hypertension in his father and mother; Liver cancer in his maternal grandfather and paternal uncle; Stroke in his mother and sister. Allergies  Allergen Reactions   Ambien  [Zolpidem ]     Unknown reaction   Bentyl  [Dicyclomine ]     Memory loss, anxiety, blurred vision   Carafate  [Sucralfate ] Nausea And Vomiting   Lansoprazole  Diarrhea   Lexapro  [Escitalopram  Oxalate]     Unknown reaction    Other     Pt states he is sensitive to all oral medications   Pneumovax [Pneumococcal Polysaccharide Vaccine] Other (See Comments)    pain   Prilosec [Omeprazole ] Other (See Comments)    Per patient joint pain with PPI's   Remeron  [Mirtazapine ]     Unknown reaction    Seroquel  [Quetiapine ] Other (See Comments)    Unknown reaction    Statins Other (See Comments)    Joint pain   Tizanidine  Other (See Comments)    Made patient "feel crazy"   Tramadol  Other (See Comments)    Pt can take small amounts up to twice daily but continued use causes chest / abdominal pain   Tylenol  [Acetaminophen ] Nausea And Vomiting    Can only take for 2 takes then he starts to have pain   Pantoprazole  Rash   Current Outpatient Medications on File Prior to Visit  Medication Sig Dispense Refill   acetaminophen  (TYLENOL ) 500 MG tablet Take 500 mg by mouth every 6 (six) hours as needed.     amitriptyline  (ELAVIL ) 25 MG tablet Take 1 tablet by mouth at bedtime.     bismuth subsalicylate (PEPTO BISMOL) 262 MG/15ML suspension Take 30 mLs by mouth every 6 (six) hours as needed.     fluconazole  (DIFLUCAN ) 100 MG tablet Take 1 tablet (100 mg total) by mouth daily. 7 tablet 0   hydrOXYzine  (ATARAX ) 25 MG tablet 1-2 tabs by mouth as needed for itching or sleep 40 tablet 2   ketoconazole  (NIZORAL ) 2 % cream Apply 1 Application topically daily. 60 g 1   LORazepam  (ATIVAN ) 0.5 MG tablet TAKE 1 TABLET BY MOUTH 2 TIMES DAILY AS NEEDED FOR ANXIETY. 60 tablet 5   Menthol , Topical Analgesic, (BIOFREEZE EX) Apply  topically.     traMADol  (ULTRAM ) 50 MG tablet Take 1 tablet (50 mg total) by mouth every 6 (six) hours as needed. 120 tablet 1   nitroGLYCERIN  (NITROSTAT ) 0.4 MG SL tablet Place 1 tablet (0.4 mg total) under the tongue every 5 (five) minutes as needed for chest pain. 90 tablet 3   No current facility-administered medications on file prior to visit.  ROS:  All others reviewed and negative.  Objective        PE:  BP 130/74 (BP Location: Right Arm, Patient Position: Sitting, Cuff Size: Normal)   Pulse 88   Temp 98.1 F (36.7 C) (Oral)   Ht 5\' 7"  (1.702 m)   Wt 170 lb (77.1 kg)   SpO2 98%   BMI 26.63 kg/m                 Constitutional: Pt appears mild ill               HENT: Head: NCAT.                Right Ear: External ear normal.                 Left Ear: External ear normal.                Eyes: . Pupils are equal, round, and reactive to light. Conjunctivae and EOM are normal               Nose: without d/c or deformity               Neck: Neck supple. Gross normal ROM               Cardiovascular: Normal rate and regular rhythm.                 Pulmonary/Chest: Effort normal and breath sounds without rales or wheezing.                Abd:  Soft, mild RLQ area pain beneath the stoma site, ND, + BS, no organomegaly, no guarding, rebound.  Has ongoing urine and bowel output stable per pt               Neurological: Pt is alert. At baseline orientation, motor grossly intact               Skin: Skin is warm. No rashes, no other new lesions, LE edema - none               Psychiatric: Pt behavior is normal without agitation   Micro: none  Cardiac tracings I have personally interpreted today:  none  Pertinent Radiological findings (summarize): none   Lab Results  Component Value Date   WBC 8.6 01/09/2024   HGB 16.3 01/09/2024   HCT 48.6 01/09/2024   PLT 269 01/09/2024   GLUCOSE 108 (H) 10/09/2023   CHOL 222 (H) 10/09/2023   TRIG 287.0 (H) 10/09/2023   HDL 38.10 (L)  10/09/2023   LDLDIRECT 169.0 06/24/2018   LDLCALC 126 (H) 10/09/2023   ALT 13 10/09/2023   AST 15 10/09/2023   NA 134 (L) 10/09/2023   K 4.4 10/09/2023   CL 102 10/09/2023   CREATININE 1.41 10/09/2023   BUN 17 10/09/2023   CO2 25 10/09/2023   TSH 3.30 10/09/2023   PSA 0.00 (L) 11/28/2022   HGBA1C 6.0 10/09/2023   Assessment/Plan:  Erik Nim Sr. is a 78 y.o. White or Caucasian [1] male with  has a past medical history of Allergic rhinitis, Allergy , Anal fissure, Anemia, Anxiety, Blood transfusion without reported diagnosis (12/06/2022), Cataract, Coronary atherosclerosis of native coronary artery (2007), Degenerative arthritis of knee, bilateral (10/09/2017), Depression, Diverticulosis (12/18/2011), Diverticulosis of colon with hemorrhage, Duodenal mass, Fibromyalgia, GERD with stricture, Helicobacter pylori (H. pylori) infection (11/25/2012), Hiatal hernia, HLD (hyperlipidemia), HTN (hypertension), Hyperlipidemia, IBS (irritable bowel syndrome),  Impotence of organic origin, Insomnia, Internal and external hemorrhoids without complication, Interstitial cystitis, Irritable bowel syndrome (06/09/2010), Kidney stone, Osteoporosis, Pelvic floor dysfunction (11/29/2017), Pneumonia, Reactive hypoglycemia, Rheumatoid arthritis(714.0), Somatization disorder (02/27/2012), and Vitamin D  deficiency (11/29/2017).  Fever Afeb in office and non toxic but sweaty and ill appearing; exam benign for URI though can't be r/o completely, but I suspect the issue must be primarily intraabdominal ;  pt declines CT or other imaging or labs for now or ED referral;  pt now for rocephin  1 gm IM, and levaquin  500 mg every day; to ED for any worsening pain  Vitamin D  deficiency Last vitamin D  Lab Results  Component Value Date   VD25OH 32.16 11/28/2022   Low, to start oral replacement   Depression Overall stable, cont current med tx  CKD stage 3a, GFR 45-59 ml/min (HCC) Lab Results  Component Value Date    CREATININE 1.41 10/09/2023   Stable overall, cont to avoid nephrotoxins,, declines f/u lab today  Followup: Return in about 3 months (around 06/04/2024).  Rosalia Colonel, MD 03/05/2024 1:06 PM [facility]

## 2024-03-05 NOTE — Assessment & Plan Note (Signed)
 Last vitamin D Lab Results  Component Value Date   VD25OH 32.16 11/28/2022   Low, to start oral replacement

## 2024-03-05 NOTE — Patient Instructions (Addendum)
 Please take all new medication as prescribed  - the levaquin  course  You had the antibiotic shot today (rocephin )  Please continue all other medications as before.  Please have the pharmacy call with any other refills you may need.  Please continue your efforts at being more active, low cholesterol diet, and weight control.  Please keep your appointments with your specialists as you may have planned  Please return for Lab and Xray if not improving  Please make an Appointment to return in 3 months, or sooner if needed

## 2024-03-05 NOTE — Assessment & Plan Note (Signed)
Overall stable, cont current med tx 

## 2024-03-05 NOTE — Assessment & Plan Note (Signed)
 Afeb in office and non toxic but sweaty and ill appearing; exam benign for URI though can't be r/o completely, but I suspect the issue must be primarily intraabdominal ;  pt declines CT or other imaging or labs for now or ED referral;  pt now for rocephin  1 gm IM, and levaquin  500 mg every day; to ED for any worsening pain

## 2024-03-05 NOTE — Assessment & Plan Note (Addendum)
 Lab Results  Component Value Date   CREATININE 1.41 10/09/2023   Stable overall, cont to avoid nephrotoxins,, declines f/u lab today

## 2024-03-10 ENCOUNTER — Encounter: Payer: Self-pay | Admitting: Internal Medicine

## 2024-03-10 ENCOUNTER — Ambulatory Visit: Payer: Self-pay

## 2024-03-10 DIAGNOSIS — R0600 Dyspnea, unspecified: Secondary | ICD-10-CM

## 2024-03-10 NOTE — Telephone Encounter (Signed)
 Ok for cxr - I have ordered for [facility], but stil would recommend going to ED as well

## 2024-03-10 NOTE — Telephone Encounter (Signed)
 Ok for cxr - I have ordered for [facility]

## 2024-03-10 NOTE — Telephone Encounter (Signed)
 Sorry, I dont have further to offer, but I can refer to Pulmonary if he likes

## 2024-03-10 NOTE — Addendum Note (Signed)
 Addended by: Roslyn Coombe on: 03/10/2024 05:01 PM   Modules accepted: Orders

## 2024-03-10 NOTE — Telephone Encounter (Signed)
 Chief Complaint: worsening SOB Symptoms: moderate SOB, cough, hernia/abdominal during coughing, generalized weakness, intermittent chest pain Frequency: x 1.5 weeks Pertinent Negatives: Patient denies swelling in legs, unilateral weakness. Disposition: [x] ED /[] Urgent Care (no appt availability in office) / [] Appointment(In office/virtual)/ []  [facility]/ [] Home Care/ [x] Refused Recommended Disposition /[] [facility]/ []  Follow-up with PCP Additional Notes: Patient seen on 03/05/24 and given Rocephin  shot with levofloxacin , states he does not feel improved. Advised ED, patient states due to bad experience in the past he is reluctant to go. Patient spoke with Dr Autry Legions via [employer] message and is aware that a pulmonary referral is available, he is asking if it could be set up as soon as possible. Patient also states he was offered a chest xray or CT scan but declined at the time but is interested now. Called [facility] and notified staff of ED refusal and patient's request.  Copied from CRM [id number]. Topic: Clinical - Red Word Triage >> Mar 10, 2024 12:26 PM Adaysia C wrote: Red Word that prompted transfer to Nurse Triage: shortness of breath, disoriented, coughing, weakness Reason for Disposition  [1] MODERATE difficulty breathing (e.g., speaks in phrases, SOB even at rest, pulse 100-120) AND [2] NEW-onset or WORSE than normal  Answer Assessment - Initial Assessment Questions 1. RESPIRATORY STATUS: "Describe your breathing?" (e.g., wheezing, shortness of breath, unable to speak, severe coughing)      Wheezing, SOB, cough.  2. ONSET: "When did this breathing problem begin?"      X 1.5 weeks.  3. PATTERN "Does the difficult breathing come and go, or has it been constant since it started?"      Constant.  4. SEVERITY: "How bad is your breathing?" (e.g., mild, moderate, severe)    - MILD: No SOB at rest, mild SOB with walking, speaks normally in sentences, can lie down, no  retractions, pulse < 100.    - MODERATE: SOB at rest, SOB with minimal exertion and prefers to sit, cannot lie down flat, speaks in phrases, mild retractions, audible wheezing, pulse 100-120.    - SEVERE: Very SOB at rest, speaks in single words, struggling to breathe, sitting hunched forward, retractions, pulse > 120      Moderate, patient states he had to sleep in the recliner last night as he could not sleep lying flat.  5. RECURRENT SYMPTOM: "Have you had difficulty breathing before?" If Yes, ask: "When was the last time?" and "What happened that time?"      Denies.  6. CARDIAC HISTORY: "Do you have any history of heart disease?" (e.g., heart attack, angina, bypass surgery, angioplasty)      CAD with stent.  7. LUNG HISTORY: "Do you have any history of lung disease?"  (e.g., pulmonary embolus, asthma, emphysema)     Denies.  8. CAUSE: "What do you think is causing the breathing problem?"      Unsure, he states it is some congestion he can not get rid of.  9. OTHER SYMPTOMS: "Do you have any other symptoms? (e.g., dizziness, runny nose, cough, chest pain, fever)     Weakness, feels brain fog, chest pain "a little bit"  10. O2 SATURATION MONITOR:  "Do you use an oxygen saturation monitor (pulse oximeter) at home?" If Yes, ask: "What is your reading (oxygen level) today?" "What is your usual oxygen saturation reading?" (e.g., 95%)       Yes, states his reading was 96% just a minute ago.  11. PREGNANCY: "Is there any chance you  are pregnant?" "When was your last menstrual period?"       N/A.  12. TRAVEL: "Have you traveled out of the country in the last month?" (e.g., travel history, exposures)       N/A.  Protocols used: Breathing Difficulty-A-AH

## 2024-03-17 DIAGNOSIS — M545 Low back pain, unspecified: Secondary | ICD-10-CM | POA: Diagnosis not present

## 2024-03-17 DIAGNOSIS — M6281 Muscle weakness (generalized): Secondary | ICD-10-CM | POA: Diagnosis not present

## 2024-03-17 DIAGNOSIS — M62838 Other muscle spasm: Secondary | ICD-10-CM | POA: Diagnosis not present

## 2024-03-18 ENCOUNTER — Encounter: Payer: Self-pay | Admitting: Internal Medicine

## 2024-03-19 DIAGNOSIS — M545 Low back pain, unspecified: Secondary | ICD-10-CM | POA: Diagnosis not present

## 2024-03-19 DIAGNOSIS — M6281 Muscle weakness (generalized): Secondary | ICD-10-CM | POA: Diagnosis not present

## 2024-03-19 DIAGNOSIS — M25661 Stiffness of right knee, not elsewhere classified: Secondary | ICD-10-CM | POA: Diagnosis not present

## 2024-03-20 DIAGNOSIS — N2889 Other specified disorders of kidney and ureter: Secondary | ICD-10-CM | POA: Diagnosis not present

## 2024-03-20 DIAGNOSIS — N10 Acute tubulo-interstitial nephritis: Secondary | ICD-10-CM | POA: Diagnosis not present

## 2024-03-20 DIAGNOSIS — K5730 Diverticulosis of large intestine without perforation or abscess without bleeding: Secondary | ICD-10-CM | POA: Diagnosis not present

## 2024-03-20 DIAGNOSIS — I700 Atherosclerosis of aorta: Secondary | ICD-10-CM | POA: Diagnosis not present

## 2024-03-20 DIAGNOSIS — K529 Noninfective gastroenteritis and colitis, unspecified: Secondary | ICD-10-CM | POA: Diagnosis not present

## 2024-03-20 DIAGNOSIS — K449 Diaphragmatic hernia without obstruction or gangrene: Secondary | ICD-10-CM | POA: Diagnosis not present

## 2024-03-20 NOTE — Telephone Encounter (Signed)
 Message was sent Via Mychart.

## 2024-03-24 DIAGNOSIS — M545 Low back pain, unspecified: Secondary | ICD-10-CM | POA: Diagnosis not present

## 2024-03-24 DIAGNOSIS — M25661 Stiffness of right knee, not elsewhere classified: Secondary | ICD-10-CM | POA: Diagnosis not present

## 2024-03-24 DIAGNOSIS — M6281 Muscle weakness (generalized): Secondary | ICD-10-CM | POA: Diagnosis not present

## 2024-03-26 DIAGNOSIS — R1011 Right upper quadrant pain: Secondary | ICD-10-CM | POA: Diagnosis not present

## 2024-03-26 DIAGNOSIS — M545 Low back pain, unspecified: Secondary | ICD-10-CM | POA: Diagnosis not present

## 2024-03-26 DIAGNOSIS — M25661 Stiffness of right knee, not elsewhere classified: Secondary | ICD-10-CM | POA: Diagnosis not present

## 2024-03-26 DIAGNOSIS — K529 Noninfective gastroenteritis and colitis, unspecified: Secondary | ICD-10-CM | POA: Diagnosis not present

## 2024-03-26 DIAGNOSIS — M6281 Muscle weakness (generalized): Secondary | ICD-10-CM | POA: Diagnosis not present

## 2024-03-30 ENCOUNTER — Encounter: Payer: Self-pay | Admitting: Internal Medicine

## 2024-03-30 ENCOUNTER — Telehealth (INDEPENDENT_AMBULATORY_CARE_PROVIDER_SITE_OTHER): Payer: Self-pay | Admitting: Internal Medicine

## 2024-03-30 DIAGNOSIS — L309 Dermatitis, unspecified: Secondary | ICD-10-CM | POA: Diagnosis not present

## 2024-03-30 DIAGNOSIS — E559 Vitamin D deficiency, unspecified: Secondary | ICD-10-CM

## 2024-03-30 DIAGNOSIS — K529 Noninfective gastroenteritis and colitis, unspecified: Secondary | ICD-10-CM

## 2024-03-30 NOTE — Progress Notes (Signed)
 Patient ID: Erik Nim Sr., male   DOB: 1946/05/13, 78 y.o.   MRN: [id number]  Virtual Visit via Video Note  I connected with Erik Nim Sr. on 03/31/24 at  1:20 PM EDT by a video enabled telemedicine application and verified that I am speaking with the correct person using two identifiers.  Location of all participants today Patient: at home Provider: at office   I discussed the limitations of evaluation and management by telemedicine and the availability of in person appointments. The patient expressed understanding and agreed to proceed.  History of Present Illness: Here to f/u after recent ED visit with abd pain  - CT c/w enteritis; now tx and tolerating  cipro /flagyl , overall now doing very well with much improved pain, nausea and no worsening pain, fever, blood.  Stools are now more loose than usual.  Pt denies chest pain, increased sob or doe, wheezing, orthopnea, PND, increased LE swelling, palpitations, dizziness or syncope.   Pt denies polydipsia, polyuria, or new focal neuro s/s.   Past Medical History:  Diagnosis Date   Allergic rhinitis    Allergy     Anal fissure    Anemia    Anxiety    Blood transfusion without reported diagnosis 12/06/2022   also 2 in the past   Cataract    starting   Coronary atherosclerosis of native coronary artery 2007   nonobstructive CAD by cath with 40% mid-LAD stenosis   Degenerative arthritis of knee, bilateral 10/09/2017   Depression    Diverticulosis 12/18/2011   Diverticulosis of colon with hemorrhage    April 2021, [facility] healthcare   Duodenal mass    Fibromyalgia    GERD with stricture    Helicobacter pylori (H. 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   Vitamin D  deficiency 11/29/2017   Past Surgical History:  Procedure Laterality Date   ADENOIDECTOMY     bladder distention     x 6   CARDIAC CATHETERIZATION     CATHETER REMOVAL     super pubic area   COLONOSCOPY     CORONARY STENT INTERVENTION N/A 05/29/2022   Procedure: CORONARY STENT INTERVENTION;  Surgeon: Swaziland, Peter M, MD;  Location: [facility] INVASIVE CV LAB;  Service: Cardiovascular;  Laterality: N/A;   DENTAL SURGERY Right    #30 tooth extraction (right upper)   ESOPHAGOGASTRODUODENOSCOPY  12/17/2022   INTERSTIM IMPLANT PLACEMENT     INTERSTIM IMPLANT REMOVAL     KNEE ARTHROSCOPY     right x 2   LEFT HEART CATH AND CORONARY ANGIOGRAPHY N/A 05/29/2022   Procedure: LEFT HEART CATH AND CORONARY ANGIOGRAPHY;  Surgeon: Swaziland, Peter M, MD;  Location: [facility] INVASIVE CV LAB;  Service: Cardiovascular;  Laterality: N/A;   NISSEN FUNDOPLICATION  2009   PROSTECTOMY     ROBOT ASSISTED LAPAROSCOPIC COMPLETE CYSTECT ILEAL CONDUIT     SHOULDER  ARTHROSCOPY  2010   left   TONSILLECTOMY AND ADENOIDECTOMY  1957   TOTAL KNEE ARTHROPLASTY Right 09/08/2021   Procedure: TOTAL KNEE ARTHROPLASTY;  Surgeon: Orvan Blanch, MD;  Location: [facility] OR[facility];  Service: Orthopedics;  Laterality: Right;   TRANSURETHRAL RESECTION OF PROSTATE     x 2   UPPER GASTROINTESTINAL ENDOSCOPY  05/13/2008   hiatal hernia   VASECTOMY      reports that he has quit smoking. He has been exposed to tobacco smoke. He has never used smokeless tobacco. He reports that he does not drink alcohol  and does not use drugs. family history includes Allergic rhinitis in his mother; Arthritis in his mother; Breast cancer in his sister; Colon polyps in his father; Diabetes in his father,  paternal uncle, and sister; Heart disease in his father and mother; Hypertension in his father and mother; Liver cancer in his maternal grandfather and paternal uncle; Stroke in his mother and sister. Allergies  Allergen Reactions   Ambien  [Zolpidem ]     Unknown reaction   Bentyl  [Dicyclomine ]     Memory loss, anxiety, blurred vision   Carafate  [Sucralfate ] Nausea And Vomiting   Lansoprazole  Diarrhea   Lexapro  [Escitalopram  Oxalate]     Unknown reaction    Other     Pt states he is sensitive to all oral medications   Pneumovax [Pneumococcal Polysaccharide Vaccine] Other (See Comments)    pain   Prilosec [Omeprazole ] Other (See Comments)    Per patient joint pain with PPI's   Remeron  [Mirtazapine ]     Unknown reaction    Seroquel  [Quetiapine ] Other (See Comments)    Unknown reaction    Statins Other (See Comments)    Joint pain   Tizanidine  Other (See Comments)    Made patient "feel crazy"   Tramadol  Other (See Comments)    Pt can take small amounts up to twice daily but continued use causes chest / abdominal pain   Tylenol  [Acetaminophen ] Nausea And Vomiting    Can only take for 2 takes then he starts to have pain   Pantoprazole  Rash   Current Outpatient Medications on File Prior to Visit  Medication Sig Dispense Refill   acetaminophen  (TYLENOL ) 500 MG tablet Take 500 mg by mouth every 6 (six) hours as needed.     amitriptyline  (ELAVIL ) 25 MG tablet Take 1 tablet by mouth at bedtime.     bismuth subsalicylate (PEPTO BISMOL) 262 MG/15ML suspension Take 30 mLs by mouth every 6 (six) hours as needed.     fluconazole  (DIFLUCAN ) 100 MG tablet Take 1 tablet (100 mg total) by mouth daily. 7 tablet 0   hydrOXYzine  (ATARAX ) 25 MG tablet 1-2 tabs by mouth as needed for itching or sleep 40 tablet 2   ketoconazole  (NIZORAL ) 2 % cream Apply 1 Application topically daily. 60 g 1   levofloxacin  (LEVAQUIN ) 500 MG tablet Take 1 tablet (500 mg total) by mouth daily. 10 tablet 0   LORazepam   (ATIVAN ) 0.5 MG tablet TAKE 1 TABLET BY MOUTH 2 TIMES DAILY AS NEEDED FOR ANXIETY. 60 tablet 5   Menthol , Topical Analgesic, (BIOFREEZE EX) Apply topically.     nitroGLYCERIN  (NITROSTAT ) 0.4 MG SL tablet Place 1 tablet (0.4 mg total) under the tongue every 5 (five) minutes as needed for chest pain. 90 tablet 3   traMADol  (ULTRAM ) 50 MG tablet Take 1 tablet (50 mg total) by mouth every 6 (six) hours as needed. 120 tablet 1   No current facility-administered medications on file prior  to visit.    Observations/Objective: Alert, NAD, appropriate mood and affect, resps normal, cn 2-12 intact, moves all 4s, no visible rash or swelling Lab Results  Component Value Date   WBC 8.6 01/09/2024   HGB 16.3 01/09/2024   HCT 48.6 01/09/2024   PLT 269 01/09/2024   GLUCOSE 108 (H) 10/09/2023   CHOL 222 (H) 10/09/2023   TRIG 287.0 (H) 10/09/2023   HDL 38.10 (L) 10/09/2023   LDLDIRECT 169.0 06/24/2018   LDLCALC 126 (H) 10/09/2023   ALT 13 10/09/2023   AST 15 10/09/2023   NA 134 (L) 10/09/2023   K 4.4 10/09/2023   CL 102 10/09/2023   CREATININE 1.41 10/09/2023   BUN 17 10/09/2023   CO2 25 10/09/2023   TSH 3.30 10/09/2023   PSA 0.00 (L) 11/28/2022   HGBA1C 6.0 10/09/2023   Assessment and Plan: See notes  Follow Up Instructions: See notes   I discussed the assessment and treatment plan with the patient. The patient was provided an opportunity to ask questions and all were answered. The patient agreed with the plan and demonstrated an understanding of the instructions.   The patient was advised to call back or seek an in-person evaluation if the symptoms worsen or if the condition fails to improve as anticipated.Rosalia Colonel, MD

## 2024-03-31 ENCOUNTER — Inpatient Hospital Stay: Admitting: Internal Medicine

## 2024-03-31 ENCOUNTER — Encounter: Payer: Self-pay | Admitting: Internal Medicine

## 2024-03-31 DIAGNOSIS — K529 Noninfective gastroenteritis and colitis, unspecified: Secondary | ICD-10-CM | POA: Insufficient documentation

## 2024-03-31 NOTE — Assessment & Plan Note (Addendum)
 Improved, to finish antibx, also add probiotic otc,  to f/u any worsening symptoms or concerns, f/u GI as well, and has repeat CT abd pelvis scheduled wed may 21

## 2024-03-31 NOTE — Assessment & Plan Note (Signed)
 Last vitamin D Lab Results  Component Value Date   VD25OH 32.16 11/28/2022   Low, to start oral replacement

## 2024-03-31 NOTE — Assessment & Plan Note (Signed)
 With increased irritation, pt for contd stoma care

## 2024-03-31 NOTE — Patient Instructions (Signed)
 Please continue all other medications as before, and add Probiotic such as Align daily until improved  Please have the pharmacy call with any other refills you may need.  Please keep your appointments with your specialists as you may have planned

## 2024-04-01 ENCOUNTER — Encounter: Payer: Self-pay | Admitting: Internal Medicine

## 2024-04-01 DIAGNOSIS — N281 Cyst of kidney, acquired: Secondary | ICD-10-CM | POA: Diagnosis not present

## 2024-04-01 DIAGNOSIS — K529 Noninfective gastroenteritis and colitis, unspecified: Secondary | ICD-10-CM | POA: Diagnosis not present

## 2024-04-01 DIAGNOSIS — K5730 Diverticulosis of large intestine without perforation or abscess without bleeding: Secondary | ICD-10-CM | POA: Diagnosis not present

## 2024-04-01 DIAGNOSIS — K760 Fatty (change of) liver, not elsewhere classified: Secondary | ICD-10-CM | POA: Diagnosis not present

## 2024-04-01 DIAGNOSIS — K435 Parastomal hernia without obstruction or  gangrene: Secondary | ICD-10-CM | POA: Diagnosis not present

## 2024-04-01 DIAGNOSIS — I700 Atherosclerosis of aorta: Secondary | ICD-10-CM | POA: Diagnosis not present

## 2024-04-03 ENCOUNTER — Inpatient Hospital Stay: Admitting: Internal Medicine

## 2024-04-09 DIAGNOSIS — N3010 Interstitial cystitis (chronic) without hematuria: Secondary | ICD-10-CM | POA: Diagnosis not present

## 2024-04-15 DIAGNOSIS — M130 Polyarthritis, unspecified: Secondary | ICD-10-CM | POA: Diagnosis not present

## 2024-04-15 DIAGNOSIS — G8929 Other chronic pain: Secondary | ICD-10-CM | POA: Diagnosis not present

## 2024-04-15 DIAGNOSIS — M818 Other osteoporosis without current pathological fracture: Secondary | ICD-10-CM | POA: Diagnosis not present

## 2024-04-15 DIAGNOSIS — Z936 Other artificial openings of urinary tract status: Secondary | ICD-10-CM | POA: Diagnosis not present

## 2024-04-15 DIAGNOSIS — Z741 Need for assistance with personal care: Secondary | ICD-10-CM | POA: Diagnosis not present

## 2024-04-15 DIAGNOSIS — N3010 Interstitial cystitis (chronic) without hematuria: Secondary | ICD-10-CM | POA: Diagnosis not present

## 2024-04-15 DIAGNOSIS — Z556 Problems related to health literacy: Secondary | ICD-10-CM | POA: Diagnosis not present

## 2024-04-15 DIAGNOSIS — I2510 Atherosclerotic heart disease of native coronary artery without angina pectoris: Secondary | ICD-10-CM | POA: Diagnosis not present

## 2024-04-16 ENCOUNTER — Telehealth: Payer: Self-pay | Admitting: Internal Medicine

## 2024-04-16 NOTE — Telephone Encounter (Signed)
 Complicated patient. Recent antibiotic treatment for "kidney infection" and "inflammation in my intestines." He was then on Align and that was 8 or 10 days ago. He felt it was making his symptoms worse and stopped it after 3 doses. He thought he had some improvement with cessation of Align. Today he woke up with the "burning pain" that is internal and in the low abdomen. He took Tylenol  which helped some. He does not believe he has had this level of intensity of this pain before. Last office visit acupuncture was suggested. The patient had dry needling. He believes it was helpful for some of his back pain. Patient is looking for new suggestions.

## 2024-04-16 NOTE — Telephone Encounter (Signed)
 Inbound call from patient, states he has been experiencing some burning on the "lower part GI' he states it is now becoming intolerable and would like to speak to a nurse urgently in regards.

## 2024-04-17 NOTE — Telephone Encounter (Signed)
 I have reviewed his recent ED and MD visits, labs and imaging.  I do not have any new recommendations to treat his recurrent burning abdominal pain problems., sorry to day. If it is not getting better or worsening he could be seen in urgent care or ED.

## 2024-04-17 NOTE — Telephone Encounter (Signed)
 Called to advise the patient. No answer. Left message on the voicemail.

## 2024-04-28 DIAGNOSIS — R140 Abdominal distension (gaseous): Secondary | ICD-10-CM | POA: Diagnosis not present

## 2024-04-28 DIAGNOSIS — K580 Irritable bowel syndrome with diarrhea: Secondary | ICD-10-CM | POA: Diagnosis not present

## 2024-05-03 ENCOUNTER — Encounter: Payer: Self-pay | Admitting: Internal Medicine

## 2024-05-04 MED ORDER — AZITHROMYCIN 250 MG PO TABS: ORAL_TABLET | ORAL | 1 refills | Status: AC

## 2024-05-04 MED ORDER — TRIAMCINOLONE ACETONIDE 55 MCG/ACT NA AERO: 2.0000 | INHALATION_SPRAY | Freq: Every day | NASAL | 12 refills | Status: DC

## 2024-05-15 DIAGNOSIS — I2510 Atherosclerotic heart disease of native coronary artery without angina pectoris: Secondary | ICD-10-CM | POA: Diagnosis not present

## 2024-05-15 DIAGNOSIS — Z936 Other artificial openings of urinary tract status: Secondary | ICD-10-CM | POA: Diagnosis not present

## 2024-05-15 DIAGNOSIS — M818 Other osteoporosis without current pathological fracture: Secondary | ICD-10-CM | POA: Diagnosis not present

## 2024-05-15 DIAGNOSIS — Z741 Need for assistance with personal care: Secondary | ICD-10-CM | POA: Diagnosis not present

## 2024-05-15 DIAGNOSIS — Z556 Problems related to health literacy: Secondary | ICD-10-CM | POA: Diagnosis not present

## 2024-05-15 DIAGNOSIS — M130 Polyarthritis, unspecified: Secondary | ICD-10-CM | POA: Diagnosis not present

## 2024-05-15 DIAGNOSIS — G8929 Other chronic pain: Secondary | ICD-10-CM | POA: Diagnosis not present

## 2024-05-15 DIAGNOSIS — N3010 Interstitial cystitis (chronic) without hematuria: Secondary | ICD-10-CM | POA: Diagnosis not present

## 2024-06-02 ENCOUNTER — Telehealth: Payer: Self-pay

## 2024-06-02 NOTE — Telephone Encounter (Signed)
 Copied from CRM [id number]. Topic: Referral - Status >> Jun 02, 2024 12:24 PM Charolett L wrote:  Reason for CRM: Bascom from [facility] home health agency is calling to request referrals for urologist Clorinda Chalk, Gastro specialist Lauraine Dry And requesting a call back# [phone]

## 2024-06-03 DIAGNOSIS — M6281 Muscle weakness (generalized): Secondary | ICD-10-CM | POA: Diagnosis not present

## 2024-06-03 DIAGNOSIS — M62838 Other muscle spasm: Secondary | ICD-10-CM | POA: Diagnosis not present

## 2024-06-03 DIAGNOSIS — M545 Low back pain, unspecified: Secondary | ICD-10-CM | POA: Diagnosis not present

## 2024-06-04 NOTE — Telephone Encounter (Signed)
 Unable to do the referrals as there is no mention of Dr Rogerio speciality.   Please clarify

## 2024-06-05 ENCOUNTER — Ambulatory Visit (INDEPENDENT_AMBULATORY_CARE_PROVIDER_SITE_OTHER): Admitting: Internal Medicine

## 2024-06-05 ENCOUNTER — Encounter: Payer: Self-pay | Admitting: Internal Medicine

## 2024-06-05 VITALS — BP 128/76 | HR 70 | Temp 97.9°F | Ht 67.0 in | Wt 170.8 lb

## 2024-06-05 DIAGNOSIS — Z906 Acquired absence of other parts of urinary tract: Secondary | ICD-10-CM

## 2024-06-05 DIAGNOSIS — Z9889 Other specified postprocedural states: Secondary | ICD-10-CM

## 2024-06-05 DIAGNOSIS — L290 Pruritus ani: Secondary | ICD-10-CM

## 2024-06-05 DIAGNOSIS — E559 Vitamin D deficiency, unspecified: Secondary | ICD-10-CM

## 2024-06-05 DIAGNOSIS — Z9079 Acquired absence of other genital organ(s): Secondary | ICD-10-CM

## 2024-06-05 MED ORDER — NITROGLYCERIN 0.4 MG SL SUBL: 0.4000 mg | SUBLINGUAL_TABLET | SUBLINGUAL | 3 refills | Status: DC | PRN

## 2024-06-05 MED ORDER — TRAMADOL HCL 50 MG PO TABS
50.0000 mg | ORAL_TABLET | Freq: Four times a day (QID) | ORAL | 1 refills | Status: DC | PRN
Start: 2024-06-05 — End: 2024-08-24

## 2024-06-05 MED ORDER — HYDROCORTISONE (PERIANAL) 2.5 % EX CREA: 1.0000 | TOPICAL_CREAM | Freq: Two times a day (BID) | CUTANEOUS | 1 refills | Status: AC

## 2024-06-05 NOTE — Telephone Encounter (Signed)
 Still waiting for clarification, so no update is possible at this time

## 2024-06-05 NOTE — Telephone Encounter (Signed)
 Copied from CRM #[id number]. Topic: Referral - Status >> Jun 05, 2024  9:34 AM Porter L wrote: Reason for CRM: tonya from [facility] called back asking for a update on the referrals , phone number is [phone]

## 2024-06-05 NOTE — Patient Instructions (Signed)
 Please take all new medication as prescribed - the cream for the itching  Please continue all other medications as before, and refills have been done if requested.  Please have the pharmacy call with any other refills you may need.  Please keep your appointments with your specialists as you may have planned  You will be contacted regarding the referral for: GI and Urology at [facility] as requested

## 2024-06-06 ENCOUNTER — Encounter: Payer: Self-pay | Admitting: Internal Medicine

## 2024-06-06 DIAGNOSIS — L290 Pruritus ani: Secondary | ICD-10-CM | POA: Insufficient documentation

## 2024-06-06 NOTE — Assessment & Plan Note (Signed)
 Last vitamin D Lab Results  Component Value Date   VD25OH 32.16 11/28/2022   Low, to start oral replacement

## 2024-06-06 NOTE — Assessment & Plan Note (Signed)
 For GI and GU referrals as requested

## 2024-06-06 NOTE — Progress Notes (Signed)
 Patient ID: Erik LITTIE Lanius Sr., male   DOB: 1946-11-02, 77 y.o.   MRN: [id number]        Chief Complaint: follow up anal itching, hx of ilial conduit, prostatectomy and cystectomy, s/p Nissen, low vit d       HPI:  Erik ALMAS Sr. is a 78 y.o. male here with c/o several weeks anal itching without pain, fever, swelling.  Pt denies chest pain, increased sob or doe, wheezing, orthopnea, PND, increased LE swelling, palpitations, dizziness or syncope.   Pt denies polydipsia, polyuria, or new focal neuro s/s.   Asking for referrals to GI and GU at [facility] Readings from Last 3 Encounters:  06/05/24 170 lb 12.8 oz (77.5 kg)  03/05/24 170 lb (77.1 kg)  01/30/24 174 lb (78.9 kg)   BP Readings from Last 3 Encounters:  06/05/24 128/76  03/05/24 130/74  01/30/24 128/66         Past Medical History:  Diagnosis Date   Allergic rhinitis    Allergy     Anal fissure    Anemia    Anxiety    Blood transfusion without reported diagnosis 12/06/2022   also 2 in the past   Cataract    starting   Coronary atherosclerosis of native coronary artery 2007   nonobstructive CAD by cath with 40% mid-LAD stenosis   Degenerative arthritis of knee, bilateral 10/09/2017   Depression    Diverticulosis 12/18/2011   Diverticulosis of colon with hemorrhage    April 2021, [facility]   Duodenal mass    Fibromyalgia    GERD with stricture    Helicobacter pylori (H. 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   Vitamin D  deficiency 11/29/2017   Past Surgical History:  Procedure Laterality Date   ADENOIDECTOMY     bladder distention     x 6   CARDIAC CATHETERIZATION     CATHETER REMOVAL     super pubic area   COLONOSCOPY     CORONARY STENT INTERVENTION N/A 05/29/2022   Procedure: CORONARY STENT INTERVENTION;  Surgeon: Swaziland, Peter M, MD;  Location: [facility] INVASIVE CV LAB;  Service: Cardiovascular;  Laterality: N/A;   DENTAL SURGERY Right    #30 tooth extraction (right upper)   ESOPHAGOGASTRODUODENOSCOPY  12/17/2022   INTERSTIM IMPLANT PLACEMENT     INTERSTIM IMPLANT REMOVAL     KNEE ARTHROSCOPY     right x 2   LEFT HEART CATH AND CORONARY ANGIOGRAPHY N/A 05/29/2022   Procedure: LEFT HEART CATH AND CORONARY ANGIOGRAPHY;  Surgeon: Swaziland, Peter M, MD;  Location: [facility] INVASIVE CV LAB;  Service: Cardiovascular;  Laterality: N/A;   NISSEN FUNDOPLICATION  2009   PROSTECTOMY     ROBOT ASSISTED LAPAROSCOPIC COMPLETE CYSTECT ILEAL CONDUIT     SHOULDER ARTHROSCOPY  2010   left   TONSILLECTOMY AND  ADENOIDECTOMY  1957   TOTAL KNEE ARTHROPLASTY Right 09/08/2021   Procedure: TOTAL KNEE ARTHROPLASTY;  Surgeon: Duwayne Purchase, MD;  Location: [facility] ORS;  Service: Orthopedics;  Laterality: Right;   TRANSURETHRAL RESECTION OF PROSTATE     x 2   UPPER GASTROINTESTINAL ENDOSCOPY  05/13/2008   hiatal hernia   VASECTOMY      reports that he has quit smoking. He has been exposed to tobacco smoke. He has never used smokeless tobacco. He reports that he does not drink alcohol  and does not use drugs. family history includes Allergic rhinitis in his mother; Arthritis in his mother; Breast cancer in his sister; Colon polyps in his father; Diabetes in his father, paternal uncle, and sister; Heart disease in his father and mother; Hypertension in his father and mother; Liver cancer in his maternal grandfather and paternal uncle; Stroke in his mother and sister. Allergies  Allergen  Reactions   Ambien  [Zolpidem ]     Unknown reaction   Bentyl  [Dicyclomine ]     Memory loss, anxiety, blurred vision   Carafate  [Sucralfate ] Nausea And Vomiting   Lansoprazole  Diarrhea   Lexapro  [Escitalopram  Oxalate]     Unknown reaction    Other     Pt states he is sensitive to all oral medications   Pneumovax [Pneumococcal Polysaccharide Vaccine] Other (See Comments)    pain   Prilosec [Omeprazole ] Other (See Comments)    Per patient joint pain with PPI's   Remeron  [Mirtazapine ]     Unknown reaction    Seroquel  [Quetiapine ] Other (See Comments)    Unknown reaction    Statins Other (See Comments)    Joint pain   Tizanidine  Other (See Comments)    Made patient feel crazy   Tramadol  Other (See Comments)    Pt can take small amounts up to twice daily but continued use causes chest / abdominal pain   Tylenol  [Acetaminophen ] Nausea And Vomiting    Can only take for 2 takes then he starts to have pain   Pantoprazole  Rash   Current Outpatient Medications on File Prior to Visit  Medication Sig Dispense Refill   acetaminophen  (TYLENOL ) 500 MG tablet Take 500 mg by mouth every 6 (six) hours as needed.     amitriptyline  (ELAVIL ) 25 MG tablet Take 1 tablet by mouth at bedtime.     bismuth subsalicylate (PEPTO BISMOL) 262 MG/15ML suspension Take 30 mLs by mouth every 6 (six) hours as needed.     fluconazole  (DIFLUCAN ) 100 MG tablet Take 1 tablet (100 mg total) by mouth daily. 7 tablet 0   hydrOXYzine  (ATARAX ) 25 MG tablet 1-2 tabs by mouth as needed for itching or sleep 40 tablet 2   ketoconazole  (NIZORAL ) 2 % cream Apply 1 Application topically daily. 60 g 1   levofloxacin  (LEVAQUIN ) 500 MG tablet Take 1 tablet (500 mg total) by mouth daily. 10 tablet 0   LORazepam  (ATIVAN ) 0.5 MG tablet TAKE 1 TABLET BY MOUTH 2 TIMES DAILY AS NEEDED FOR ANXIETY. 60 tablet 5   Menthol , Topical Analgesic, (BIOFREEZE EX) Apply topically.     triamcinolone  (NASACORT ) 55 MCG/ACT AERO nasal inhaler Place 2  sprays into the nose daily. 1 each 12   No current facility-administered medications on file prior to visit.        ROS:  All others reviewed and negative.  Objective        PE:  BP 128/76   Pulse 70   Temp 97.9 F (36.6 C)   Ht 5'  7 (1.702 m)   Wt 170 lb 12.8 oz (77.5 kg)   SpO2 98%   BMI 26.75 kg/m                 Constitutional: Pt appears in NAD               HENT: Head: NCAT.                Right Ear: External ear normal.                 Left Ear: External ear normal.                Eyes: . Pupils are equal, round, and reactive to light. Conjunctivae and EOM are normal               Nose: without d/c or deformity               Neck: Neck supple. Gross normal ROM               Cardiovascular: Normal rate and regular rhythm.                 Pulmonary/Chest: Effort normal and breath sounds without rales or wheezing.                Abd:  Soft, NT, ND, + BS, no organomegaly               Neurological: Pt is alert. At baseline orientation, motor grossly intact               Skin: Skin is warm. No rashes, no other new lesions, LE edema - none               Psychiatric: Pt behavior is normal without agitation   Micro: none  Cardiac tracings I have personally interpreted today:  none  Pertinent Radiological findings (summarize): none   Lab Results  Component Value Date   WBC 8.6 01/09/2024   HGB 16.3 01/09/2024   HCT 48.6 01/09/2024   PLT 269 01/09/2024   GLUCOSE 108 (H) 10/09/2023   CHOL 222 (H) 10/09/2023   TRIG 287.0 (H) 10/09/2023   HDL 38.10 (L) 10/09/2023   LDLDIRECT 169.0 06/24/2018   LDLCALC 126 (H) 10/09/2023   ALT 13 10/09/2023   AST 15 10/09/2023   NA 134 (L) 10/09/2023   K 4.4 10/09/2023   CL 102 10/09/2023   CREATININE 1.41 10/09/2023   BUN 17 10/09/2023   CO2 25 10/09/2023   TSH 3.30 10/09/2023   PSA 0.00 (L) 11/28/2022   HGBA1C 6.0 10/09/2023   Assessment/Plan:  Erik LITTIE Lanius Sr. is a 78 y.o. White or Caucasian [1] male with  has a past  medical history of Allergic rhinitis, Allergy , Anal fissure, Anemia, Anxiety, Blood transfusion without reported diagnosis (12/06/2022), Cataract, Coronary atherosclerosis of native coronary artery (2007), Degenerative arthritis of knee, bilateral (10/09/2017), Depression, Diverticulosis (12/18/2011), Diverticulosis of colon with hemorrhage, Duodenal mass, Fibromyalgia, GERD with stricture, Helicobacter pylori (H. pylori) infection (11/25/2012), Hiatal hernia, HLD (hyperlipidemia), HTN (hypertension), Hyperlipidemia, IBS (irritable bowel syndrome), Impotence of organic origin, Insomnia, Internal and external hemorrhoids without complication, Interstitial cystitis, Irritable bowel syndrome (06/09/2010), Kidney stone, Osteoporosis, Pelvic floor dysfunction (11/29/2017), Pneumonia, Reactive hypoglycemia, Rheumatoid arthritis(714.0), Somatization disorder (02/27/2012), and Vitamin D  deficiency (11/29/2017).  Vitamin D  deficiency Last vitamin D  Lab Results  Component Value Date   VD25OH 32.16 11/28/2022   Low, to start oral replacement  History of ileal conduit For GI and GU referrals as requested  History of prostatectomy For GI and GU referrals as requested  History of total cystectomy For GI and GU referrals as requested  S/P Nissen fundoplication (without gastrostomy tube) procedure For GI and GU referrals as requested  Anal itching For anusol  HC cream asd prn  Followup: Return if symptoms worsen or fail to improve.  Lynwood Rush, MD 06/06/2024 7:34 PM [facility]

## 2024-06-06 NOTE — Assessment & Plan Note (Signed)
 For anusol  HC cream asd prn

## 2024-06-08 ENCOUNTER — Other Ambulatory Visit: Payer: Self-pay | Admitting: Internal Medicine

## 2024-06-09 ENCOUNTER — Encounter: Payer: Self-pay | Admitting: Internal Medicine

## 2024-06-10 ENCOUNTER — Ambulatory Visit: Payer: Self-pay

## 2024-06-10 NOTE — Telephone Encounter (Signed)
 FYI Only or Action Required?: Action required by provider: medication refill request and update on patient condition.  Patient was last seen in primary care on 06/05/2024 by Norleen Lynwood ORN, MD.  Called Nurse Triage reporting Medication Reaction.  Symptoms began today.  Interventions attempted: Nothing.  Symptoms are: completely resolved.  Triage Disposition: Call PCP When Office is Open  Patient/caregiver understands and will follow disposition?: Yes Message from [name] sent at 06/10/2024 12:00 PM EDT  Summary: Adverse Reaction to Hydrocortisone  Cream   [name], patients Healthcare Advocate called in and stated that patient is having a reaction to the hydrocortisone  cream that he was prescribed. Patient got really confused, disoriented, up all night. Adrenal insufficiency. Patient did send a message through MyChart as well. [name] was not with patient. Advised will give patient a call to follow up.  Patient also wants to know why his refill request for Protonix  was not filled.  [phone] [name] (M)         Reason for Disposition  [1] Caller has NON-URGENT medicine question about med that PCP prescribed AND [2] triager unable to answer question  Answer Assessment - Initial Assessment Questions Appointment scheduled to address confusion  1. NAME of MEDICINE: What medicine(s) are you calling about?     Cortisone Patient would also like to resume protonix /pantoprazole  - states that he is experiencing bad heartburn 2. QUESTION: What is your question? (e.g., double dose of medicine, side effect)     Patient states that the cortisone cream that he used last night for hemorrhoid itching caused his to be awake all night and caused confusion for about 12 hours during the day.  He states that he has felt confused before, but never like this.  Patient A and O x 3 during call  3. PRESCRIBER: Who prescribed the medicine? Reason: if prescribed by specialist, call should be  referred to that group.     Dr. Norleen 4. SYMPTOMS: Do you have any symptoms? If Yes, ask: What symptoms are you having?  How bad are the symptoms (e.g., mild, moderate, severe)     Denies any symptoms of confusion at this time and admits that he feels confused from time to time.  Stating not often and never to this magnitude.  Patient states that he had difficulty making decisions at work  Protocols used: Medication Question Call-A-AH

## 2024-06-11 DIAGNOSIS — Z936 Other artificial openings of urinary tract status: Secondary | ICD-10-CM | POA: Diagnosis not present

## 2024-06-11 DIAGNOSIS — I2510 Atherosclerotic heart disease of native coronary artery without angina pectoris: Secondary | ICD-10-CM | POA: Diagnosis not present

## 2024-06-11 DIAGNOSIS — Z741 Need for assistance with personal care: Secondary | ICD-10-CM | POA: Diagnosis not present

## 2024-06-11 DIAGNOSIS — M130 Polyarthritis, unspecified: Secondary | ICD-10-CM | POA: Diagnosis not present

## 2024-06-11 DIAGNOSIS — Z556 Problems related to health literacy: Secondary | ICD-10-CM | POA: Diagnosis not present

## 2024-06-11 DIAGNOSIS — M818 Other osteoporosis without current pathological fracture: Secondary | ICD-10-CM | POA: Diagnosis not present

## 2024-06-11 DIAGNOSIS — N3010 Interstitial cystitis (chronic) without hematuria: Secondary | ICD-10-CM | POA: Diagnosis not present

## 2024-06-11 DIAGNOSIS — G8929 Other chronic pain: Secondary | ICD-10-CM | POA: Diagnosis not present

## 2024-06-11 NOTE — Telephone Encounter (Signed)
Addressed by PCP in MyChart message.

## 2024-06-12 ENCOUNTER — Telehealth (INDEPENDENT_AMBULATORY_CARE_PROVIDER_SITE_OTHER): Admitting: Internal Medicine

## 2024-06-12 ENCOUNTER — Encounter: Payer: Self-pay | Admitting: Internal Medicine

## 2024-06-12 DIAGNOSIS — E559 Vitamin D deficiency, unspecified: Secondary | ICD-10-CM

## 2024-06-12 DIAGNOSIS — D649 Anemia, unspecified: Secondary | ICD-10-CM

## 2024-06-12 DIAGNOSIS — R5383 Other fatigue: Secondary | ICD-10-CM

## 2024-06-12 NOTE — Assessment & Plan Note (Signed)
 Last vitamin D Lab Results  Component Value Date   VD25OH 32.16 11/28/2022   Low, to start oral replacement

## 2024-06-12 NOTE — Assessment & Plan Note (Signed)
 Etiology unclear, for cortisol, cbc and labs as ordered,  to f/u any worsening symptoms or concerns

## 2024-06-12 NOTE — Progress Notes (Signed)
 Patient ID: Erik LITTIE Lanius Sr., male   DOB: Apr 20, 1946, 78 y.o.   MRN: [id number]  Virtual Visit via Video Note  I connected with Erik LITTIE Lanius Sr. on 06/12/24 at 11:00 AM EDT by a video enabled telemedicine application and verified that I am speaking with the correct person using two identifiers.  Location of all participants Patient: at home Provider: at office   I discussed the limitations of evaluation and management by telemedicine and the availability of in person appointments. The patient expressed understanding and agreed to proceed.  History of Present Illness: Pt here with c/o coincident with starting the recent topical steroid cream, believes this has been related to a constellation of symptoms including dizziness, weak all over, confused at times, blurred vision, but no worsening pain, syncope or falls.  Pt denies chest pain, increased sob or doe, wheezing, orthopnea, PND, increased LE swelling, palpitations, dizziness or syncope.   Pt denies polydipsia, polyuria, or new focal neuro s/s.   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   Vitamin D  deficiency 11/29/2017   Past Surgical History:  Procedure Laterality Date   ADENOIDECTOMY     bladder distention     x 6   CARDIAC CATHETERIZATION     CATHETER REMOVAL     super pubic area   COLONOSCOPY     CORONARY STENT INTERVENTION N/A 05/29/2022   Procedure: CORONARY STENT INTERVENTION;  Surgeon: Swaziland, Peter M, MD;  Location: [facility] INVASIVE CV LAB;  Service: Cardiovascular;  Laterality: N/A;   DENTAL SURGERY Right    #30 tooth extraction (right upper)   ESOPHAGOGASTRODUODENOSCOPY  12/17/2022   INTERSTIM IMPLANT PLACEMENT     INTERSTIM IMPLANT REMOVAL     KNEE ARTHROSCOPY     right x 2   LEFT HEART CATH AND CORONARY ANGIOGRAPHY N/A 05/29/2022   Procedure: LEFT HEART CATH AND CORONARY ANGIOGRAPHY;  Surgeon: Swaziland, Peter M, MD;  Location: [facility] INVASIVE CV LAB;  Service: Cardiovascular;  Laterality: N/A;   NISSEN FUNDOPLICATION  2009   PROSTECTOMY     ROBOT ASSISTED LAPAROSCOPIC COMPLETE CYSTECT ILEAL CONDUIT     SHOULDER ARTHROSCOPY  2010   left  TONSILLECTOMY AND ADENOIDECTOMY  1957   TOTAL KNEE ARTHROPLASTY Right 09/08/2021   Procedure: TOTAL KNEE ARTHROPLASTY;  Surgeon: Duwayne Purchase, MD;  Location: [facility] ORS;  Service: Orthopedics;  Laterality: Right;   TRANSURETHRAL RESECTION OF PROSTATE     x 2   UPPER GASTROINTESTINAL ENDOSCOPY  05/13/2008   hiatal hernia   VASECTOMY      reports that he has quit smoking. He has been exposed to tobacco smoke. He has never used smokeless tobacco. He reports that he does not drink alcohol  and does not use drugs. family history includes Allergic rhinitis in his mother; Arthritis in his mother; Breast cancer in his sister; Colon polyps in his father; Diabetes in his father,  paternal uncle, and sister; Heart disease in his father and mother; Hypertension in his father and mother; Liver cancer in his maternal grandfather and paternal uncle; Stroke in his mother and sister. Allergies  Allergen Reactions   Ambien  [Zolpidem ]     Unknown reaction   Bentyl  [Dicyclomine ]     Memory loss, anxiety, blurred vision   Carafate  [Sucralfate ] Nausea And Vomiting   Lansoprazole  Diarrhea   Lexapro  [Escitalopram  Oxalate]     Unknown reaction    Other     Pt states he is sensitive to all oral medications   Pneumovax [Pneumococcal Polysaccharide Vaccine] Other (See Comments)    pain   Prilosec [Omeprazole ] Other (See Comments)    Per patient joint pain with PPI's   Remeron  [Mirtazapine ]     Unknown reaction    Seroquel  [Quetiapine ] Other (See Comments)    Unknown reaction    Statins Other (See Comments)    Joint pain   Tizanidine  Other (See Comments)    Made patient feel crazy   Tramadol  Other (See Comments)    Pt can take small amounts up to twice daily but continued use causes chest / abdominal pain   Tylenol  [Acetaminophen ] Nausea And Vomiting    Can only take for 2 takes then he starts to have pain   Pantoprazole  Rash   Current Outpatient Medications on File Prior to Visit  Medication Sig Dispense Refill   acetaminophen  (TYLENOL ) 500 MG tablet Take 500 mg by mouth every 6 (six) hours as needed.     amitriptyline  (ELAVIL ) 25 MG tablet Take 1 tablet by mouth at bedtime.     bismuth subsalicylate (PEPTO BISMOL) 262 MG/15ML suspension Take 30 mLs by mouth every 6 (six) hours as needed.     fluconazole  (DIFLUCAN ) 100 MG tablet Take 1 tablet (100 mg total) by mouth daily. 7 tablet 0   hydrocortisone  (ANUSOL -HC) 2.5 % rectal cream Place 1 Application rectally 2 (two) times daily. 30 g 1   hydrOXYzine  (ATARAX ) 25 MG tablet 1-2 tabs by mouth as needed for itching or sleep 40 tablet 2   ketoconazole  (NIZORAL ) 2 % cream Apply 1 Application topically daily. 60 g 1    levofloxacin  (LEVAQUIN ) 500 MG tablet Take 1 tablet (500 mg total) by mouth daily. 10 tablet 0   LORazepam  (ATIVAN ) 0.5 MG tablet TAKE 1 TABLET BY MOUTH 2 TIMES DAILY AS NEEDED FOR ANXIETY. 60 tablet 5   Menthol , Topical Analgesic, (BIOFREEZE EX) Apply topically.     nitroGLYCERIN  (NITROSTAT ) 0.4 MG SL tablet Place 1 tablet (0.4 mg total) under the tongue every 5 (five) minutes as needed for chest pain. 90 tablet 3   traMADol  (ULTRAM ) 50 MG tablet Take 1 tablet (50 mg total) by mouth every 6 (six) hours as needed. 120  tablet 1   triamcinolone  (NASACORT ) 55 MCG/ACT AERO nasal inhaler Place 2 sprays into the nose daily. 1 each 12   No current facility-administered medications on file prior to visit.    Observations/Objective: Alert, NAD, appropriate mood and affect, resps normal, cn 2-12 intact, moves all 4s, no visible rash or swelling Lab Results  Component Value Date   WBC 8.6 01/09/2024   HGB 16.3 01/09/2024   HCT 48.6 01/09/2024   PLT 269 01/09/2024   GLUCOSE 108 (H) 10/09/2023   CHOL 222 (H) 10/09/2023   TRIG 287.0 (H) 10/09/2023   HDL 38.10 (L) 10/09/2023   LDLDIRECT 169.0 06/24/2018   LDLCALC 126 (H) 10/09/2023   ALT 13 10/09/2023   AST 15 10/09/2023   NA 134 (L) 10/09/2023   K 4.4 10/09/2023   CL 102 10/09/2023   CREATININE 1.41 10/09/2023   BUN 17 10/09/2023   CO2 25 10/09/2023   TSH 3.30 10/09/2023   PSA 0.00 (L) 11/28/2022   HGBA1C 6.0 10/09/2023   Assessment and Plan: See notes  Follow Up Instructions: See notes   I discussed the assessment and treatment plan with the patient. The patient was provided an opportunity to ask questions and all were answered. The patient agreed with the plan and demonstrated an understanding of the instructions.   The patient was advised to call back or seek an in-person evaluation if the symptoms worsen or if the condition fails to improve as anticipated.   Lynwood Rush, MD

## 2024-06-12 NOTE — Assessment & Plan Note (Signed)
Also for f/u iron level 

## 2024-06-12 NOTE — Patient Instructions (Signed)
Please continue all other medications as before, and refills have been done if requested.  Please have the pharmacy call with any other refills you may need.  Please keep your appointments with your specialists as you may have planned  Please go to the LAB at the blood drawing area for the tests to be done  You will be contacted by phone if any changes need to be made immediately.  Otherwise, you will receive a letter about your results with an explanation, but please check with MyChart first.

## 2024-06-15 ENCOUNTER — Encounter: Payer: Self-pay | Admitting: Internal Medicine

## 2024-06-15 ENCOUNTER — Ambulatory Visit: Payer: Self-pay | Admitting: Internal Medicine

## 2024-06-15 ENCOUNTER — Other Ambulatory Visit (INDEPENDENT_AMBULATORY_CARE_PROVIDER_SITE_OTHER)

## 2024-06-15 DIAGNOSIS — R5383 Other fatigue: Secondary | ICD-10-CM | POA: Diagnosis not present

## 2024-06-15 DIAGNOSIS — D649 Anemia, unspecified: Secondary | ICD-10-CM

## 2024-06-15 DIAGNOSIS — N189 Chronic kidney disease, unspecified: Secondary | ICD-10-CM | POA: Diagnosis not present

## 2024-06-15 DIAGNOSIS — M818 Other osteoporosis without current pathological fracture: Secondary | ICD-10-CM | POA: Diagnosis not present

## 2024-06-15 DIAGNOSIS — M130 Polyarthritis, unspecified: Secondary | ICD-10-CM | POA: Diagnosis not present

## 2024-06-15 LAB — CBC WITH DIFFERENTIAL/PLATELET
Basophils Absolute: 0.1 K/uL (ref 0.0–0.1)
Basophils Relative: 0.6 % (ref 0.0–3.0)
Eosinophils Absolute: 0.1 K/uL (ref 0.0–0.7)
Eosinophils Relative: 1.2 % (ref 0.0–5.0)
HCT: 47.1 % (ref 39.0–52.0)
Hemoglobin: 15.7 g/dL (ref 13.0–17.0)
Lymphocytes Relative: 34.2 % (ref 12.0–46.0)
Lymphs Abs: 3 K/uL (ref 0.7–4.0)
MCHC: 33.4 g/dL (ref 30.0–36.0)
MCV: 90.3 fl (ref 78.0–100.0)
Monocytes Absolute: 0.8 K/uL (ref 0.1–1.0)
Monocytes Relative: 9.2 % (ref 3.0–12.0)
Neutro Abs: 4.7 K/uL (ref 1.4–7.7)
Neutrophils Relative %: 54.8 % (ref 43.0–77.0)
Platelets: 272 K/uL (ref 150.0–400.0)
RBC: 5.21 Mil/uL (ref 4.22–5.81)
RDW: 13.1 % (ref 11.5–15.5)
WBC: 8.6 K/uL (ref 4.0–10.5)

## 2024-06-15 LAB — BASIC METABOLIC PANEL WITH GFR
BUN: 17 mg/dL (ref 6–23)
CO2: 28 meq/L (ref 19–32)
Calcium: 9.5 mg/dL (ref 8.4–10.5)
Chloride: 101 meq/L (ref 96–112)
Creatinine, Ser: 1.22 mg/dL (ref 0.40–1.50)
GFR: 57.29 mL/min — ABNORMAL LOW (ref >60.00)
Glucose, Bld: 111 mg/dL — ABNORMAL HIGH (ref 70–99)
Potassium: 4.2 meq/L (ref 3.5–5.1)
Sodium: 137 meq/L (ref 135–145)

## 2024-06-15 LAB — IBC PANEL
Iron: 71 ug/dL (ref 42–165)
Saturation Ratios: 20 % (ref 20.0–50.0)
TIBC: 354.2 ug/dL (ref 250.0–450.0)
Transferrin: 253 mg/dL (ref 212.0–360.0)

## 2024-06-15 LAB — HEPATIC FUNCTION PANEL
ALT: 17 U/L (ref 0–53)
AST: 17 U/L (ref 0–37)
Albumin: 4.4 g/dL (ref 3.5–5.2)
Alkaline Phosphatase: 62 U/L (ref 39–117)
Bilirubin, Direct: 0.1 mg/dL (ref 0.0–0.3)
Total Bilirubin: 0.5 mg/dL (ref 0.2–1.2)
Total Protein: 7.4 g/dL (ref 6.0–8.3)

## 2024-06-15 LAB — FERRITIN: Ferritin: 22.9 ng/mL (ref 22.0–322.0)

## 2024-06-15 LAB — CORTISOL: Cortisol, Plasma: 15.5 ug/dL

## 2024-06-15 NOTE — Progress Notes (Signed)
 The test results show that your current treatment is OK, as the tests are stable.  Please continue the same plan.  There is no other need for change of treatment or further evaluation based on these results, at this time.  thanks

## 2024-06-16 DIAGNOSIS — N189 Chronic kidney disease, unspecified: Secondary | ICD-10-CM | POA: Diagnosis not present

## 2024-06-16 DIAGNOSIS — M818 Other osteoporosis without current pathological fracture: Secondary | ICD-10-CM | POA: Diagnosis not present

## 2024-06-16 DIAGNOSIS — M130 Polyarthritis, unspecified: Secondary | ICD-10-CM | POA: Diagnosis not present

## 2024-06-16 DIAGNOSIS — N3010 Interstitial cystitis (chronic) without hematuria: Secondary | ICD-10-CM | POA: Diagnosis not present

## 2024-06-16 MED ORDER — PANTOPRAZOLE SODIUM 40 MG PO TBEC: 40.0000 mg | DELAYED_RELEASE_TABLET | Freq: Every day | ORAL | 3 refills | Status: DC | PRN

## 2024-06-16 NOTE — Addendum Note (Signed)
 Addended by: NORLEEN LYNWOOD ORN on: 06/16/2024 11:41 AM   Modules accepted: Orders

## 2024-06-24 ENCOUNTER — Telehealth: Payer: Self-pay

## 2024-06-24 DIAGNOSIS — G4710 Hypersomnia, unspecified: Secondary | ICD-10-CM

## 2024-06-24 NOTE — Telephone Encounter (Signed)
 Copied from CRM [id number]. Topic: Clinical - Medical Advice >> Jun 23, 2024  4:10 PM Shereese L wrote: Reason for CRM: tonya from [facility] health called in and stated that the patient needs a referral for a sleep study doctor because he's having trouble sleeping Cb# [phone]

## 2024-06-26 NOTE — Telephone Encounter (Signed)
 Ok referral is done to pulmonary for r/o sleep apnea

## 2024-07-02 ENCOUNTER — Encounter: Payer: Self-pay | Admitting: Internal Medicine

## 2024-07-02 DIAGNOSIS — K6289 Other specified diseases of anus and rectum: Secondary | ICD-10-CM

## 2024-07-03 ENCOUNTER — Ambulatory Visit: Payer: Self-pay

## 2024-07-03 DIAGNOSIS — G4700 Insomnia, unspecified: Secondary | ICD-10-CM

## 2024-07-03 DIAGNOSIS — R0600 Dyspnea, unspecified: Secondary | ICD-10-CM

## 2024-07-03 NOTE — Telephone Encounter (Signed)
 FYI Only or Action Required?: Action required by provider: update on patient condition.  Patient was last seen in primary care on 06/12/2024 by Norleen Lynwood ORN, MD.  Called Nurse Triage reporting Insomnia.  Symptoms began several months ago.  Interventions attempted: Nothing.  Symptoms are: gradually worsening.  Triage Disposition: See PCP When Office is Open (Within 3 Days)  Patient/caregiver understands and will follow disposition?: Yes           Copied from CRM [id number]. Topic: Clinical - Medical Advice >> Jun 23, 2024  4:10 PM Charolett L wrote: Reason for CRM: tonya from [facility] called in and stated that the patient needs a referral for a sleep study doctor because he's having trouble sleeping Cb# [phone] >> Jul 03, 2024  3:16 PM Franky GRADE wrote:  Patient is experiencing spells of dizziness.   Reason for Disposition  MODERATE - SEVERE insomnia (e.g., awake most of night; lack of sleep interferes with ability to function during the day; interferes with work or school)  Answer Assessment - Initial Assessment Questions Ongoing for months. Spoke with Tonya, Health Advocate from [facility] regarding some of his symptoms, pt joined call as well. Pt states he is also having issues with dizziness and weakness for 10 days. He states he can do a EKG at home and monitor is oxygen levels. He states his oxygen is fine and EKG was normal. He states he has continuing lower GI/ pelvic floor where bladder used to be. States pain is a 5 or 6. Pt also concerned about referral for sleep study. Pt made aware that order was put in for study per Dr. Nicola message on his chart.  Pt's Health advocate wanting to get patient in with [facility]. Health advocate would like to get him in for further testing for pelvic floor pain he is having.    1. DESCRIPTION: Tell me about your sleeping problem. (e.g., waking frequently during night, sleeping during day and awake at night, trouble falling asleep)  How bad is it?      Both   2. ONSET: How long have you been having trouble sleeping? (e.g., days, weeks, months; longstanding sleep problems)     Ongoing for months  3. DAYTIME SLEEP PATTERN: How much time do you spend sleeping or napping during the day?     Not napping  4. STRESSORS: Is there anything that is making you feel stressed? Is there something that worries you?     No 5. PAIN: Do you have any pain that is keeping you awake? (e.g., back pain, joint pain) If Yes, ask How bad is the pain? (e.g., scale 0-10; mild, moderate, severe).     No 8. MEDICINE CHANGE: Has there been any recent change in medicines? (e.g., new medicine started, stopped, or dose changed).     None  10. OTHER SYMPTOMS: Do you have any other symptoms?  (e.g., difficulty breathing)       Ongoing pain  Protocols used: Insomnia-A-AH  Reason for Disposition  MODERATE - SEVERE insomnia (e.g., awake most of night; lack of sleep interferes with ability to function during the day; interferes with work or school)  Answer Assessment - Initial Assessment Questions Ongoing for months. Spoke with Tonya, Health Advocate from [facility]. Also having issues with dizziness and weakness for 10 days. EKG and oxygen fine at home Lower Gi pelvic floor where bladder used to be (5-6/10)    1. DESCRIPTION: Tell me about your sleeping problem. (e.g., waking frequently during  night, sleeping during day and awake at night, trouble falling asleep) How bad is it?      Both   2. ONSET: How long have you been having trouble sleeping? (e.g., days, weeks, months; longstanding sleep problems)     Ongoing for months  3. DAYTIME SLEEP PATTERN: How much time do you spend sleeping or napping during the day?     Not napping  4. STRESSORS: Is there anything that is making you feel stressed? Is there something that worries you?     No 5. PAIN: Do you have any pain that is keeping you awake? (e.g., back pain, joint  pain) If Yes, ask How bad is the pain? (e.g., scale 0-10; mild, moderate, severe).     No 8. MEDICINE CHANGE: Has there been any recent change in medicines? (e.g., new medicine started, stopped, or dose changed).     None  10. OTHER SYMPTOMS: Do you have any other symptoms?  (e.g., difficulty breathing)       Denies any other issues  Protocols used: Insomnia-A-AH

## 2024-07-03 NOTE — Telephone Encounter (Signed)
 Unfortunately this has been d/w pt at last visit - I dont have anything else to offer   thanks

## 2024-07-06 ENCOUNTER — Encounter: Payer: Self-pay | Admitting: Internal Medicine

## 2024-07-06 ENCOUNTER — Ambulatory Visit: Admitting: Internal Medicine

## 2024-07-06 VITALS — BP 122/72 | HR 85 | Temp 98.3°F | Ht 67.0 in | Wt 170.0 lb

## 2024-07-06 DIAGNOSIS — F5101 Primary insomnia: Secondary | ICD-10-CM | POA: Diagnosis not present

## 2024-07-06 DIAGNOSIS — F32A Depression, unspecified: Secondary | ICD-10-CM

## 2024-07-06 DIAGNOSIS — E559 Vitamin D deficiency, unspecified: Secondary | ICD-10-CM | POA: Diagnosis not present

## 2024-07-06 DIAGNOSIS — E7800 Pure hypercholesterolemia, unspecified: Secondary | ICD-10-CM | POA: Diagnosis not present

## 2024-07-06 DIAGNOSIS — R45851 Suicidal ideations: Secondary | ICD-10-CM | POA: Diagnosis not present

## 2024-07-06 MED ORDER — MIRTAZAPINE 15 MG PO TABS: 15.0000 mg | ORAL_TABLET | Freq: Every day | ORAL | 3 refills | Status: DC

## 2024-07-06 NOTE — Assessment & Plan Note (Signed)
 Lab Results  Component Value Date   LDLCALC 126 (H) 10/09/2023   Uncontrolled, declines statin for now, for lower chol diet

## 2024-07-06 NOTE — Assessment & Plan Note (Signed)
 Last vitamin D Lab Results  Component Value Date   VD25OH 32.16 11/28/2022   Low, to start oral replacement

## 2024-07-06 NOTE — Assessment & Plan Note (Signed)
 With recent worsening, for remeron  15 mg at bedtime, and refer psychiatry, and ED precautions given for any worsening, he should go to ED

## 2024-07-06 NOTE — Assessment & Plan Note (Signed)
 Also hopefully to improve with remeron  15 mg qhs

## 2024-07-06 NOTE — Progress Notes (Signed)
 Patient ID: [name] Lanius Sr., male   DOB: October 03, 1946, 78 y.o.   MRN: [id number]        Chief Complaint: follow up depression with suicidal ideation, persistent insomnia, chronic GI pain, hld, low vit d       HPI:  Erik PINEDA Sr. is a 78 y.o. male here with c/o worsening depression symptoms with recent suicidal ideation, but no plan.  Has chronic pain, persistent insomnia, and persistent pelvic pain not amenable to pain management per pt in past.  Pt denies chest pain, increased sob or doe, wheezing, orthopnea, PND, increased LE swelling, palpitations, dizziness or syncope.   Pt denies polydipsia, polyuria, or new focal neuro s/s.    Pt denies fever, wt loss, night sweats, loss of appetite, or other constitutional symptoms         Wt Readings from Last 3 Encounters:  07/06/24 170 lb (77.1 kg)  06/05/24 170 lb 12.8 oz (77.5 kg)  03/05/24 170 lb (77.1 kg)   BP Readings from Last 3 Encounters:  07/06/24 122/72  06/05/24 128/76  03/05/24 130/74         Past Medical History:  Diagnosis Date   Allergic rhinitis    Allergy     Anal fissure    Anemia    Anxiety    Blood transfusion without reported diagnosis 12/06/2022   also 2 in the past   Cataract    starting   Coronary atherosclerosis of native coronary artery 2007   nonobstructive CAD by cath with 40% mid-LAD stenosis   Degenerative arthritis of knee, bilateral 10/09/2017   Depression    Diverticulosis 12/18/2011   Diverticulosis of colon with hemorrhage    April 2021, [facility]   Duodenal mass    Fibromyalgia    GERD with stricture    Helicobacter pylori (H. 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   Vitamin D  deficiency 11/29/2017   Past Surgical History:  Procedure Laterality Date   ADENOIDECTOMY     bladder distention     x 6   CARDIAC CATHETERIZATION     CATHETER REMOVAL     super pubic area   COLONOSCOPY     CORONARY STENT INTERVENTION N/A 05/29/2022   Procedure: CORONARY STENT INTERVENTION;  Surgeon: Swaziland, Peter M, MD;  Location: [facility] INVASIVE CV LAB;  Service: Cardiovascular;  Laterality: N/A;   DENTAL SURGERY Right    #30 tooth extraction (right upper)   ESOPHAGOGASTRODUODENOSCOPY  12/17/2022   INTERSTIM IMPLANT PLACEMENT     INTERSTIM IMPLANT REMOVAL     KNEE ARTHROSCOPY     right x 2   LEFT HEART CATH AND CORONARY ANGIOGRAPHY N/A 05/29/2022   Procedure: LEFT HEART CATH AND CORONARY ANGIOGRAPHY;  Surgeon: Swaziland, Peter M, MD;  Location: [facility] INVASIVE CV LAB;  Service: Cardiovascular;  Laterality: N/A;   NISSEN FUNDOPLICATION  2009  PROSTECTOMY     ROBOT ASSISTED LAPAROSCOPIC COMPLETE CYSTECT ILEAL CONDUIT     SHOULDER ARTHROSCOPY  2010   left   TONSILLECTOMY AND ADENOIDECTOMY  1957   TOTAL KNEE ARTHROPLASTY Right 09/08/2021   Procedure: TOTAL KNEE ARTHROPLASTY;  Surgeon: Duwayne Purchase, MD;  Location: [facility] ORS;  Service: Orthopedics;  Laterality: Right;   TRANSURETHRAL RESECTION OF PROSTATE     x 2   UPPER GASTROINTESTINAL ENDOSCOPY  05/13/2008   hiatal hernia   VASECTOMY      reports that he has quit smoking. He has been exposed to tobacco smoke. He has never used smokeless tobacco. He reports that he does not drink alcohol  and does not use drugs. family history includes Allergic rhinitis in his mother; Arthritis in his mother; Breast cancer in his sister; Colon polyps in his father; Diabetes in his father, paternal uncle, and sister; Heart disease in his  father and mother; Hypertension in his father and mother; Liver cancer in his maternal grandfather and paternal uncle; Stroke in his mother and sister. Allergies  Allergen Reactions   Ambien  [Zolpidem ]     Unknown reaction   Bentyl  [Dicyclomine ]     Memory loss, anxiety, blurred vision   Carafate  [Sucralfate ] Nausea And Vomiting   Lansoprazole  Diarrhea   Lexapro  [Escitalopram  Oxalate]     Unknown reaction    Other     Pt states he is sensitive to all oral medications   Pneumovax [Pneumococcal Polysaccharide Vaccine] Other (See Comments)    pain   Prilosec [Omeprazole ] Other (See Comments)    Per patient joint pain with PPI's   Remeron  [Mirtazapine ]     Unknown reaction    Seroquel  [Quetiapine ] Other (See Comments)    Unknown reaction    Statins Other (See Comments)    Joint pain   Tizanidine  Other (See Comments)    Made patient feel crazy   Tramadol  Other (See Comments)    Pt can take small amounts up to twice daily but continued use causes chest / abdominal pain   Tylenol  [Acetaminophen ] Nausea And Vomiting    Can only take for 2 takes then he starts to have pain   Pantoprazole  Rash   Current Outpatient Medications on File Prior to Visit  Medication Sig Dispense Refill   acetaminophen  (TYLENOL ) 500 MG tablet Take 500 mg by mouth every 6 (six) hours as needed.     amitriptyline  (ELAVIL ) 25 MG tablet Take 1 tablet by mouth at bedtime.     bismuth subsalicylate (PEPTO BISMOL) 262 MG/15ML suspension Take 30 mLs by mouth every 6 (six) hours as needed.     fluconazole  (DIFLUCAN ) 100 MG tablet Take 1 tablet (100 mg total) by mouth daily. 7 tablet 0   hydrocortisone  (ANUSOL -HC) 2.5 % rectal cream Place 1 Application rectally 2 (two) times daily. 30 g 1   hydrOXYzine  (ATARAX ) 25 MG tablet 1-2 tabs by mouth as needed for itching or sleep 40 tablet 2   ketoconazole  (NIZORAL ) 2 % cream Apply 1 Application topically daily. 60 g 1   LORazepam  (ATIVAN ) 0.5 MG tablet TAKE 1 TABLET BY MOUTH  2 TIMES DAILY AS NEEDED FOR ANXIETY. 60 tablet 5   nitroGLYCERIN  (NITROSTAT ) 0.4 MG SL tablet Place 1 tablet (0.4 mg total) under the tongue every 5 (five) minutes as needed for chest pain. 90 tablet 3   pantoprazole  (PROTONIX ) 40 MG tablet Take 1 tablet (40 mg total) by mouth daily as needed. 90 tablet 3   traMADol  (ULTRAM ) 50 MG  tablet Take 1 tablet (50 mg total) by mouth every 6 (six) hours as needed. 120 tablet 1   triamcinolone  (NASACORT ) 55 MCG/ACT AERO nasal inhaler Place 2 sprays into the nose daily. 1 each 12   Menthol , Topical Analgesic, (BIOFREEZE EX) Apply topically. (Patient not taking: Reported on 07/06/2024)     No current facility-administered medications on file prior to visit.        ROS:  All others reviewed and negative.  Objective        PE:  BP 122/72   Pulse 85   Temp 98.3 F (36.8 C)   Ht 5' 7 (1.702 m)   Wt 170 lb (77.1 kg)   SpO2 94%   BMI 26.63 kg/m                 Constitutional: Pt appears in NAD               HENT: Head: NCAT.                Right Ear: External ear normal.                 Left Ear: External ear normal.                Eyes: . Pupils are equal, round, and reactive to light. Conjunctivae and EOM are normal               Nose: without d/c or deformity               Neck: Neck supple. Gross normal ROM               Cardiovascular: Normal rate and regular rhythm.                 Pulmonary/Chest: Effort normal and breath sounds without rales or wheezing.                               Neurological: Pt is alert. At baseline orientation, motor grossly intact               Skin: Skin is warm. No rashes, no other new lesions, LE edema - none               Psychiatric: Pt behavior is normal without agitation , depressed affect  Micro: none  Cardiac tracings I have personally interpreted today:  none  Pertinent Radiological findings (summarize): none   Lab Results  Component Value Date   WBC 8.6 06/15/2024   HGB 15.7 06/15/2024   HCT  47.1 06/15/2024   PLT 272.0 06/15/2024   GLUCOSE 111 (H) 06/15/2024   CHOL 222 (H) 10/09/2023   TRIG 287.0 (H) 10/09/2023   HDL 38.10 (L) 10/09/2023   LDLDIRECT 169.0 06/24/2018   LDLCALC 126 (H) 10/09/2023   ALT 17 06/15/2024   AST 17 06/15/2024   NA 137 06/15/2024   K 4.2 06/15/2024   CL 101 06/15/2024   CREATININE 1.22 06/15/2024   BUN 17 06/15/2024   CO2 28 06/15/2024   TSH 3.30 10/09/2023   PSA 0.00 (L) 11/28/2022   HGBA1C 6.0 10/09/2023   Assessment/Plan:  [name] Lanius Sr. is a 78 y.o. White or Caucasian [1] male with  has a past medical history of Allergic rhinitis, Allergy , Anal fissure, Anemia, Anxiety, Blood transfusion without reported diagnosis (12/06/2022), Cataract, Coronary atherosclerosis of native coronary artery (2007), Degenerative arthritis of  knee, bilateral (10/09/2017), Depression, Diverticulosis (12/18/2011), Diverticulosis of colon with hemorrhage, Duodenal mass, Fibromyalgia, GERD with stricture, Helicobacter pylori (H. pylori) infection (11/25/2012), Hiatal hernia, HLD (hyperlipidemia), HTN (hypertension), Hyperlipidemia, IBS (irritable bowel syndrome), Impotence of organic origin, Insomnia, Internal and external hemorrhoids without complication, Interstitial cystitis, Irritable bowel syndrome (06/09/2010), Kidney stone, Osteoporosis, Pelvic floor dysfunction (11/29/2017), Pneumonia, Reactive hypoglycemia, Rheumatoid arthritis(714.0), Somatization disorder (02/27/2012), and Vitamin D  deficiency (11/29/2017).  Depression with suicidal ideation With recent worsening, for remeron  15 mg at bedtime, and refer psychiatry, and ED precautions given for any worsening, he should go to ED  Hyperlipidemia Lab Results  Component Value Date   LDLCALC 126 (H) 10/09/2023   Uncontrolled, declines statin for now, for lower chol diet   Vitamin D  deficiency Last vitamin D  Lab Results  Component Value Date   VD25OH 32.16 11/28/2022   Low, to start oral  replacement   Insomnia Also hopefully to improve with remeron  15 mg qhs Followup: Return in about 6 months (around 01/06/2025).  Lynwood Rush, MD 07/06/2024 7:15 PM [facility]

## 2024-07-06 NOTE — Patient Instructions (Signed)
 Please take all new medication as prescribed - the remeron  15 mg at bedtime  Please continue all other medications as before, and refills have been done if requested.  Please have the pharmacy call with any other refills you may need.  Please keep your appointments with your specialists as you may have planned  You will be contacted regarding the referral for: Psychiatry

## 2024-07-08 NOTE — Telephone Encounter (Unsigned)
 Copied from CRM #[id number]. Topic: Referral - Status >> Jun 05, 2024  9:34 AM Porter L wrote: Reason for CRM: tonya from [facility] called back asking for a update on the referrals , phone number is [phone] >> Jul 08, 2024  2:49 PM Drema MATSU wrote: Tonya with [facility] is calling to check on referral status. Advised referral placed. She wants to know where Pulmonology department patient was referred to. She is requesting a callback.

## 2024-07-10 NOTE — Addendum Note (Signed)
 Addended by: NORLEEN LYNWOOD ORN on: 07/10/2024 04:37 PM   Modules accepted: Orders

## 2024-07-10 NOTE — Telephone Encounter (Signed)
 Ok we can refer to pulmonary as I dont have anything else for this problem

## 2024-07-12 DIAGNOSIS — N189 Chronic kidney disease, unspecified: Secondary | ICD-10-CM | POA: Diagnosis not present

## 2024-07-12 DIAGNOSIS — M818 Other osteoporosis without current pathological fracture: Secondary | ICD-10-CM | POA: Diagnosis not present

## 2024-07-12 DIAGNOSIS — N3010 Interstitial cystitis (chronic) without hematuria: Secondary | ICD-10-CM | POA: Diagnosis not present

## 2024-07-12 DIAGNOSIS — M130 Polyarthritis, unspecified: Secondary | ICD-10-CM | POA: Diagnosis not present

## 2024-07-16 DIAGNOSIS — M130 Polyarthritis, unspecified: Secondary | ICD-10-CM | POA: Diagnosis not present

## 2024-07-16 DIAGNOSIS — K638211 Small intestinal bacterial overgrowth, hydrogen-subtype: Secondary | ICD-10-CM | POA: Diagnosis not present

## 2024-07-16 DIAGNOSIS — N3010 Interstitial cystitis (chronic) without hematuria: Secondary | ICD-10-CM | POA: Diagnosis not present

## 2024-07-16 DIAGNOSIS — M818 Other osteoporosis without current pathological fracture: Secondary | ICD-10-CM | POA: Diagnosis not present

## 2024-07-16 DIAGNOSIS — N189 Chronic kidney disease, unspecified: Secondary | ICD-10-CM | POA: Diagnosis not present

## 2024-07-17 DIAGNOSIS — K638219 Small intestinal bacterial overgrowth, unspecified: Secondary | ICD-10-CM | POA: Diagnosis not present

## 2024-07-17 DIAGNOSIS — K638211 Small intestinal bacterial overgrowth, hydrogen-subtype: Secondary | ICD-10-CM | POA: Diagnosis not present

## 2024-07-19 ENCOUNTER — Other Ambulatory Visit: Payer: Self-pay | Admitting: Internal Medicine

## 2024-07-22 DIAGNOSIS — M130 Polyarthritis, unspecified: Secondary | ICD-10-CM | POA: Diagnosis not present

## 2024-07-22 DIAGNOSIS — N3010 Interstitial cystitis (chronic) without hematuria: Secondary | ICD-10-CM | POA: Diagnosis not present

## 2024-07-22 DIAGNOSIS — N189 Chronic kidney disease, unspecified: Secondary | ICD-10-CM | POA: Diagnosis not present

## 2024-07-22 DIAGNOSIS — M818 Other osteoporosis without current pathological fracture: Secondary | ICD-10-CM | POA: Diagnosis not present

## 2024-07-24 DIAGNOSIS — I10 Essential (primary) hypertension: Secondary | ICD-10-CM | POA: Diagnosis not present

## 2024-07-24 DIAGNOSIS — L989 Disorder of the skin and subcutaneous tissue, unspecified: Secondary | ICD-10-CM | POA: Diagnosis not present

## 2024-07-24 DIAGNOSIS — R109 Unspecified abdominal pain: Secondary | ICD-10-CM | POA: Diagnosis not present

## 2024-07-24 DIAGNOSIS — I509 Heart failure, unspecified: Secondary | ICD-10-CM | POA: Diagnosis not present

## 2024-07-24 DIAGNOSIS — K6389 Other specified diseases of intestine: Secondary | ICD-10-CM | POA: Diagnosis not present

## 2024-07-24 DIAGNOSIS — Z0000 Encounter for general adult medical examination without abnormal findings: Secondary | ICD-10-CM | POA: Diagnosis not present

## 2024-08-05 ENCOUNTER — Encounter (HOSPITAL_BASED_OUTPATIENT_CLINIC_OR_DEPARTMENT_OTHER): Payer: Self-pay

## 2024-08-05 ENCOUNTER — Ambulatory Visit (HOSPITAL_BASED_OUTPATIENT_CLINIC_OR_DEPARTMENT_OTHER)

## 2024-08-05 VITALS — BP 154/76 | HR 88 | Wt 172.0 lb

## 2024-08-05 DIAGNOSIS — Z87891 Personal history of nicotine dependence: Secondary | ICD-10-CM | POA: Diagnosis not present

## 2024-08-05 DIAGNOSIS — G4719 Other hypersomnia: Secondary | ICD-10-CM

## 2024-08-05 DIAGNOSIS — G4710 Hypersomnia, unspecified: Secondary | ICD-10-CM | POA: Diagnosis not present

## 2024-08-05 NOTE — Patient Instructions (Signed)
 Complete home sleep test as ordered.  Plan for follow up in 6-8 weeks for test result review.  Follow sleep hygiene as discussed.

## 2024-08-05 NOTE — Progress Notes (Signed)
 @Patient  ID: Erik LITTIE Lanius Sr., male    DOB: 1945/11/17, 78 y.o.   MRN: [id number]  Chief Complaint  Patient presents with   Establish Care    New Sleep     Referring provider: Norleen [name] ORN, MD  HPI: [name] is a 78 y/o male with PMH of CAD s/p stent, allergic rhinitis, GERD, and depression who presents as a new patient for evaluation of sleep.  Patient reports that he has has sleep disturbances for several years most associated with medical issues such as interstitial cystitis.  He has had frequent wake ups at night and feels that his sleep is disturbed and therefore he has daytime fatigue.  After discussing this further and with his wife's help, it is noted that he does have snoring, daytime headaches, excessive daytime sleepiness, and at times startles awake with shortness of breath.  He reports that he goes to bed around 9:45 at night and stays in bed until 8 or 830 the next morning.  He only takes him about 20 to 30 minutes to fall asleep and he reports several episodes of waking up in the night.  Those are sometimes a startle awake and sometimes they are for other reasons.  He reports a 10 pound increase in his weight in the last couple years and feels that this has worsened his symptoms.  He denies fever, chills, night sweats, weight loss, chest pain, productive cough, appetite changes.  He quit smoking in 1983 and quit drinking alcohol  in 1990.  He denies any drug use and is unable to tolerate caffeine.  He lives at home with his wife and is on disabled status.  He has never been evaluated for sleep apnea in the past.  TEST/EVENTS :  Cardiac cath 05/29/2022:  Single vessel obstructive CAD Normal LVEDP Successful PCI of the proximal to mid LAD with OCT guidance and DES x 1  Allergies  Allergen Reactions   Ambien  [Zolpidem ]     Unknown reaction   Bentyl  [Dicyclomine ]     Memory loss, anxiety, blurred vision   Carafate  [Sucralfate ] Nausea And Vomiting    Lansoprazole  Diarrhea   Lexapro  [Escitalopram  Oxalate]     Unknown reaction    Other     Pt states he is sensitive to all oral medications   Pneumovax [Pneumococcal Polysaccharide Vaccine] Other (See Comments)    pain   Prilosec [Omeprazole ] Other (See Comments)    Per patient joint pain with PPI's   Remeron  [Mirtazapine ]     Unknown reaction    Seroquel  [Quetiapine ] Other (See Comments)    Unknown reaction    Statins Other (See Comments)    Joint pain   Tizanidine  Other (See Comments)    Made patient feel crazy   Tramadol  Other (See Comments)    Pt can take small amounts up to twice daily but continued use causes chest / abdominal pain   Tylenol  [Acetaminophen ] Nausea And Vomiting    Can only take for 2 takes then he starts to have pain   Pantoprazole  Rash    Immunization History  Administered Date(s) Administered   Moderna Sars-Covid-2 Vaccination 01/11/2020, 02/08/2020   Pneumococcal Polysaccharide-23 07/03/2013   Pneumococcal-Unspecified 11/12/2012   Tdap 08/23/2014    Past Medical History:  Diagnosis Date   Allergic rhinitis    Allergy     Anal fissure    Anemia    Anxiety    Blood transfusion without reported diagnosis 12/06/2022   also 2 in  the past   Cataract    starting   Coronary atherosclerosis of native coronary artery 2007   nonobstructive CAD by cath with 40% mid-LAD stenosis   Degenerative arthritis of knee, bilateral 10/09/2017   Depression    Diverticulosis 12/18/2011   Diverticulosis of colon with hemorrhage    April 2021, [facility]   Duodenal mass    Fibromyalgia    GERD with stricture    Helicobacter pylori (H. 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     amitriptyline  (ELAVIL ) 25 MG tablet Take 1 tablet by mouth at bedtime.     bismuth subsalicylate (PEPTO BISMOL) 262 MG/15ML suspension Take 30 mLs by mouth every 6 (six) hours as needed.     fluconazole  (DIFLUCAN ) 100 MG tablet Take 1 tablet (100 mg total) by mouth daily. 7 tablet 0   hydrocortisone  (ANUSOL -HC) 2.5 % rectal cream Place 1 Application rectally 2 (two) times daily. 30 g 1   hydrOXYzine  (ATARAX ) 25 MG tablet 1-2 tabs by mouth as needed for itching or sleep 40 tablet 2   ketoconazole  (NIZORAL ) 2 % cream Apply 1 Application topically daily. 60 g 1   LORazepam  (ATIVAN ) 0.5 MG tablet TAKE 1 TABLET BY MOUTH TWICE A DAY AS NEEDED FOR ANXIETY 60 tablet 5   Menthol , Topical Analgesic, (BIOFREEZE EX) Apply topically.     mirtazapine  (REMERON ) 15 MG tablet Take 1 tablet (15 mg total) by mouth at bedtime. 90 tablet 3   nitroGLYCERIN  (NITROSTAT ) 0.4 MG SL tablet Place 1 tablet (0.4 mg total) under the tongue every 5 (five) minutes as needed for chest pain. 90 tablet 3   pantoprazole  (PROTONIX ) 40 MG tablet Take 1 tablet (40 mg total) by mouth daily as needed. 90 tablet 3   traMADol  (ULTRAM ) 50 MG tablet Take 1 tablet (50 mg total) by mouth every 6 (six) hours as needed. 120 tablet 1   triamcinolone  (NASACORT ) 55 MCG/ACT  AERO nasal inhaler Place 2 sprays into the nose daily. (Patient not taking: Reported on 08/05/2024) 1 each 12   No facility-administered medications prior to visit.     Review of Systems: Positive as per HPI  Constitutional:   No  weight loss, night sweats,  Fevers, chills, fatigue, or  lassitude.  HEENT:   No headaches,  Difficulty swallowing,  Tooth/dental problems, or  Sore throat,  No sneezing, itching, ear ache, nasal congestion, post nasal drip,   CV:  No chest pain,  Orthopnea, PND, swelling in lower extremities, anasarca, dizziness, palpitations, syncope.   GI  No heartburn, indigestion, abdominal pain, nausea, vomiting, diarrhea, change in bowel habits, loss of appetite, bloody stools.   Resp: No shortness of breath with exertion or at rest.  No excess mucus, no productive cough,  No non-productive cough,  No coughing up of blood.  No change in color of mucus.  No wheezing.  No chest wall deformity  Skin: no rash or lesions.  GU: no dysuria, change in color of urine, no urgency or frequency.  No flank pain, no hematuria   MS:  No joint pain or swelling.  No decreased range of motion.  No back pain.    Physical Exam  BP (!) 154/76   Pulse 88   Wt 172 lb (78 kg)   SpO2 96%   BMI 26.94 kg/m   GEN: A/Ox3; pleasant , NAD, well nourished    HEENT:  [place]/AT,  EACs-clear, TMs-wnl, NOSE-clear, THROAT-clear, no lesions, no postnasal drip or exudate noted.  Mallampati 4  NECK:  Supple w/ fair ROM; no JVD; normal carotid impulses w/o bruits; no thyromegaly or nodules palpated; no lymphadenopathy.    RESP  Clear  P & A; w/o, wheezes/ rales/ or rhonchi. no accessory muscle use, no dullness to percussion  CARD:  RRR, no m/r/g, no peripheral edema, pulses intact, no cyanosis or clubbing.  GI:   Soft & nt; nml bowel sounds; no organomegaly or masses detected.   Musco: Warm bil, no deformities or joint swelling noted.   Neuro: alert, no focal deficits noted.     Skin: Warm, no lesions or rashes    Lab Results:  CBC    Component Value Date/Time   WBC 8.6 06/15/2024 0840   RBC 5.21 06/15/2024 0840   HGB 15.7 06/15/2024 0840   HGB 16.3 01/09/2024 1319   HGB 14.5 05/18/2022 1023   HCT 47.1 06/15/2024 0840   HCT 43.2 05/18/2022 1023   PLT 272.0 06/15/2024 0840   PLT 269 01/09/2024 1319   PLT 326 05/18/2022 1023   MCV 90.3 06/15/2024 0840   MCV 89 05/18/2022 1023   MCH 30.5 01/09/2024 1319   MCHC 33.4 06/15/2024 0840   RDW 13.1 06/15/2024 0840   RDW 12.7 05/18/2022 1023   LYMPHSABS 3.0 06/15/2024 0840   MONOABS 0.8 06/15/2024 0840   EOSABS 0.1 06/15/2024 0840   BASOSABS 0.1 06/15/2024 0840    BMET    Component Value Date/Time   NA 137 06/15/2024 0840   NA 139 05/18/2022 1023   K 4.2 06/15/2024 0840   CL 101 06/15/2024 0840   CO2 28 06/15/2024 0840   GLUCOSE 111 (H) 06/15/2024 0840   BUN 17 06/15/2024 0840   BUN 22 05/18/2022 1023   CREATININE 1.22 06/15/2024 0840   CREATININE 1.43 (H) 07/11/2023 1610   CALCIUM 9.5 06/15/2024 0840   GFRNONAA 51 (L) 07/11/2023 1610   GFRAA 57 (L) 03/26/2017 1506    BNP No results found for: BNP  ProBNP No results found for: PROBNP  Imaging: No results found.  Administration History     None           No data to display          No results found for: NITRICOXIDE   Assessment & Plan:  [name] is a 78 y/o male with PMH  of CAD s/p stent, allergic rhinitis, GERD, and depression who presents as a new patient for evaluation of sleep. Concern is for sleep apnea with c/o snoring, PND, headaches, and excessive daytime sleepiness.  Notably several of his medications may also be causing his sleepiness, but it is imperative to r/o sleep apnea as the root source of his tiredness. Assessment & Plan Excessive daytime sleepiness - Plan for home sleep test to further evaluate for sleep apnea. - Follow sleep hygiene as discussed at today's appointment. -  Informational packet for sleep apnea and the sleep study itself attached to AVS for review. - Plan for follow-up in 6 to 8 weeks to review sleep test results.   Return in about 8 weeks (around 09/30/2024) for sleep study review.  Candis Dandy, PA-C 08/05/2024

## 2024-08-06 ENCOUNTER — Other Ambulatory Visit: Payer: Self-pay | Admitting: Internal Medicine

## 2024-08-06 ENCOUNTER — Ambulatory Visit: Payer: Self-pay

## 2024-08-06 ENCOUNTER — Telehealth: Payer: Self-pay

## 2024-08-06 NOTE — Telephone Encounter (Signed)
 Copied from CRM #[id number]. Topic: Clinical - Medication Refill >> Aug 06, 2024  8:42 AM Zy'onna H wrote: Medication:  LORazepam  LORazepam  (ATIVAN ) 0.5 MG tablet   Has the patient contacted their pharmacy? Yes (Agent: If no, request that the patient contact the pharmacy for the refill. If patient does not wish to contact the pharmacy document the reason why and proceed with request.) (Agent: If yes, when and what did the pharmacy advise?)  This is the patient's preferred pharmacy:  [id number] [place], [place] - [address] AT [facility] [address] [place] [address] Phone: [phone] Fax: [phone]   Is this the correct pharmacy for this prescription? Yes If no, delete pharmacy and type the correct one.   Has the prescription been filled recently? Yes  Is the patient out of the medication? Yes  Has the patient been seen for an appointment in the last year OR does the patient have an upcoming appointment? Yes  Can we respond through MyChart? Yes  Agent: Please be advised that Rx refills may take up to 3 business days. We ask that you follow-up with your pharmacy.

## 2024-08-06 NOTE — Telephone Encounter (Signed)
 FYI Only or Action Required?: Action required by provider: medication refill request and see alternate refill encounter.  Patient was last seen in primary care on 07/06/2024 by [name] ORN, MD.  Called Nurse Triage reporting Medication Refill.  Symptoms began today.  Interventions attempted: Nothing.  Symptoms are: gradually worsening.  Triage Disposition: Call PCP Now  Patient/caregiver understands and will follow disposition?: Yes       Copied from CRM #[id number]. Topic: Clinical - Red Word Triage >> Aug 06, 2024  2:10 PM [name] wrote: [name] that prompted transfer to Nurse Triage: Pt called earlier in the day to place another refill for his LORazepam  (ATIVAN ) 0.5 MG tablet due to spilling it on his bathroom floor and contaminating the med, he called to check on the status of refill, I informed him of the standard turnaround time for refills and he requested to speak to NT to get advised of best next course of action since he takes this med twice per day. Reason for Disposition  [1] Prescription refill request for ESSENTIAL medicine (i.e., likelihood of harm to patient if not taken) AND [2] triager unable to refill per department policy    Triager called PCP CAL and spoke to [facility] re: pt request. [name] will relay concern to PCP's office clinician.  Triager advised pt that request has been escalated. Patient verbalized understanding and is expecting call back from office if Rx refill can be accommodated.  Answer Assessment - Initial Assessment Questions 1. DRUG NAME: What medicine do you need to have refilled?     LORazepam  (ATIVAN ) 0.5 MG tablet 2. REFILLS REMAINING: How many refills are remaining? Notes: The label on the medicine or pill bottle will show how many refills are remaining. If there are no refills remaining, then a renewal may be needed.     0 3. EXPIRATION DATE: What is the expiration date? Note: The label states when the prescription will expire, and thus  can no longer be refilled.)     N/a 4. PRESCRIBER: Who prescribed it? Note: The prescribing doctor or group is responsible for refill approvals.[name]     PCP 5. PHARMACY: Have you contacted your pharmacy (drugstore)? Note: Some pharmacies will contact the doctor (or NP/PA).      Pt reports that he returned the contaminated pills to pharmacy and pharmacist advised that it is PCP's discretion to refill. 6. SYMPTOMS: Do you have any symptoms?     I'm starting to get concerned because I have to have it. I've been on it for 17 years. 7. PREGNANCY: Is there any chance that you are pregnant? When was your last menstrual period?     N/a  Protocols used: Medication Refill and Renewal Call-A-AH

## 2024-08-06 NOTE — Telephone Encounter (Signed)
 Copied from CRM [id number]. Topic: Clinical - Prescription Issue >> Aug 06, 2024  2:38 PM Carlyon D wrote: Reason for CRM: [name] health care advocate is calling in regards to pt she states pt dropped  LORazepam  (ATIVAN ) 0.5 MG tablet on floor then  took them into pharmacy,  they stated he needs a new prescription On script it has to say the date and it can be filled early due to other script being damaged.  Preferred pharmacy: [id number] [place], [place] - [address] AT [facility] [address] [place] [address] Phone: [phone] Fax: [phone]  Please reach out to pt with update

## 2024-08-07 ENCOUNTER — Other Ambulatory Visit: Payer: Self-pay | Admitting: Internal Medicine

## 2024-08-07 NOTE — Telephone Encounter (Signed)
 Copied from CRM #[id number]. Topic: Clinical - Medication Refill >> Aug 07, 2024  4:34 PM Sophia H wrote: Medication: LORazepam  (ATIVAN ) 0.5 MG tablet   Has the patient contacted their pharmacy? Yes, pharmacy calling on behalf of the patient. Requesting an early fill, patient had an accident with a spill.   This is the patient's preferred pharmacy:  [id number] - [place], [place] - [address] AT [facility] [address] [place] [address] Phone: [phone] Fax: [phone]  Is this the correct pharmacy for this prescription? Yes If no, delete pharmacy and type the correct one.   Has the prescription been filled recently? Yes  Is the patient out of the medication? Yes  Has the patient been seen for an appointment in the last year OR does the patient have an upcoming appointment? Yes, seen in August.   Can we respond through MyChart? Yes  Agent: Please be advised that Rx refills may take up to 3 business days. We ask that you follow-up with your pharmacy.

## 2024-08-10 ENCOUNTER — Other Ambulatory Visit: Payer: Self-pay | Admitting: Internal Medicine

## 2024-08-10 MED ORDER — LORAZEPAM 0.5 MG PO TABS: 0.5000 mg | ORAL_TABLET | Freq: Two times a day (BID) | ORAL | 5 refills | Status: DC | PRN

## 2024-08-10 NOTE — Telephone Encounter (Signed)
Ok this was refilled

## 2024-08-10 NOTE — Telephone Encounter (Signed)
 Ok done erx

## 2024-08-10 NOTE — Telephone Encounter (Signed)
Being addressed in separate note.

## 2024-08-10 NOTE — Telephone Encounter (Unsigned)
 Copied from CRM [id number]. Topic: General - Other >> Aug 10, 2024 12:23 PM Aleatha C wrote: Reason for CRM: [facility] health care advocate called to check on prescription   LORazepam  (ATIVAN ) 0.5 MG tablet >> Aug 10, 2024  3:58 PM Franky GRADE wrote: Tonya from [facility] is calling regarding the  LORazepam  (ATIVAN ) 0.5 MG table prescription. The pharmacy is not filling the prescription until 08/16/2024 without Provider's authorization. The patient isn't out of medication but dropped the pills. He tried to return them to the pharmacy but they would not accept or fill the new script. The new prescription needs to state okay to fill early or we can call the pharmacy and provide a verbal.

## 2024-08-11 ENCOUNTER — Telehealth: Payer: Self-pay | Admitting: Radiology

## 2024-08-11 DIAGNOSIS — N189 Chronic kidney disease, unspecified: Secondary | ICD-10-CM | POA: Diagnosis not present

## 2024-08-11 DIAGNOSIS — M818 Other osteoporosis without current pathological fracture: Secondary | ICD-10-CM | POA: Diagnosis not present

## 2024-08-11 DIAGNOSIS — M130 Polyarthritis, unspecified: Secondary | ICD-10-CM | POA: Diagnosis not present

## 2024-08-11 DIAGNOSIS — N3010 Interstitial cystitis (chronic) without hematuria: Secondary | ICD-10-CM | POA: Diagnosis not present

## 2024-08-11 NOTE — Telephone Encounter (Signed)
 Called and spoke with pharmacist and gave verbal OK on distributing this medication.

## 2024-08-11 NOTE — Telephone Encounter (Signed)
 Copied from CRM [id number]. Topic: Clinical - Medical Advice >> Aug 11, 2024 12:00 PM Harlene ORN wrote: Reason for CRM: Patient called to follow up on his spilled medication last thursday and requesting a refill. The Pharmacy is telling the patient that they are witholding the medication until 10/08 for some reason. [facility] and tell them it is okay for the patient to get his meds.

## 2024-08-19 DIAGNOSIS — R053 Chronic cough: Secondary | ICD-10-CM | POA: Diagnosis not present

## 2024-08-19 DIAGNOSIS — R0609 Other forms of dyspnea: Secondary | ICD-10-CM | POA: Diagnosis not present

## 2024-08-19 DIAGNOSIS — M19011 Primary osteoarthritis, right shoulder: Secondary | ICD-10-CM | POA: Diagnosis not present

## 2024-08-19 DIAGNOSIS — J9811 Atelectasis: Secondary | ICD-10-CM | POA: Diagnosis not present

## 2024-08-19 DIAGNOSIS — R0602 Shortness of breath: Secondary | ICD-10-CM | POA: Diagnosis not present

## 2024-08-19 DIAGNOSIS — Z7902 Long term (current) use of antithrombotics/antiplatelets: Secondary | ICD-10-CM | POA: Diagnosis not present

## 2024-08-19 DIAGNOSIS — Z66 Do not resuscitate: Secondary | ICD-10-CM | POA: Diagnosis not present

## 2024-08-19 DIAGNOSIS — R739 Hyperglycemia, unspecified: Secondary | ICD-10-CM | POA: Diagnosis not present

## 2024-08-19 DIAGNOSIS — Z7982 Long term (current) use of aspirin: Secondary | ICD-10-CM | POA: Diagnosis not present

## 2024-08-19 DIAGNOSIS — I252 Old myocardial infarction: Secondary | ICD-10-CM | POA: Diagnosis not present

## 2024-08-19 DIAGNOSIS — I2510 Atherosclerotic heart disease of native coronary artery without angina pectoris: Secondary | ICD-10-CM | POA: Diagnosis not present

## 2024-08-24 ENCOUNTER — Ambulatory Visit (INDEPENDENT_AMBULATORY_CARE_PROVIDER_SITE_OTHER): Admitting: Internal Medicine

## 2024-08-24 ENCOUNTER — Encounter: Payer: Self-pay | Admitting: Internal Medicine

## 2024-08-24 VITALS — BP 126/82 | HR 80 | Temp 98.9°F | Ht 67.0 in | Wt 173.0 lb

## 2024-08-24 DIAGNOSIS — I219 Acute myocardial infarction, unspecified: Secondary | ICD-10-CM | POA: Insufficient documentation

## 2024-08-24 DIAGNOSIS — R5383 Other fatigue: Secondary | ICD-10-CM

## 2024-08-24 DIAGNOSIS — L290 Pruritus ani: Secondary | ICD-10-CM

## 2024-08-24 DIAGNOSIS — K130 Diseases of lips: Secondary | ICD-10-CM

## 2024-08-24 DIAGNOSIS — F341 Dysthymic disorder: Secondary | ICD-10-CM

## 2024-08-24 MED ORDER — KETOCONAZOLE 2 % EX CREA: 1.0000 | TOPICAL_CREAM | Freq: Every day | CUTANEOUS | 1 refills | Status: DC

## 2024-08-24 MED ORDER — NYSTATIN 100000 UNIT/ML MT SUSP: 500000.0000 [IU] | Freq: Four times a day (QID) | OROMUCOSAL | 0 refills | Status: AC

## 2024-08-24 MED ORDER — PANTOPRAZOLE SODIUM 40 MG PO TBEC: 40.0000 mg | DELAYED_RELEASE_TABLET | Freq: Every day | ORAL | 3 refills | Status: AC | PRN

## 2024-08-24 MED ORDER — TRAMADOL HCL 50 MG PO TABS: 50.0000 mg | ORAL_TABLET | Freq: Four times a day (QID) | ORAL | 1 refills | Status: AC | PRN

## 2024-08-24 NOTE — Patient Instructions (Signed)
 Please take all new medication as prescribed - the mouth wash for 10 days, and the cream for the edges of the mouth/lips (angular cheilitis)  Ok to use the OTC Benadryl  cream for itching  Ok to stop the mirtazipine  Please continue all other medications as before, and refills have been done to the new pharmacy  Please have the pharmacy call with any other refills you may need.  Please continue your efforts at being more active, low cholesterol diet, and weight control.  You are otherwise up to date with prevention measures today.  Please keep your appointments with your specialists as you may have planned - psychiatry in November  Please go to the LAB at the blood drawing area for the tests to be done - the cortisol at your convenience  You will be contacted by phone if any changes need to be made immediately.  Otherwise, you will receive a letter about your results with an explanation, but please check with MyChart first.  Please make an Appointment to return in 6 months, or sooner if needed

## 2024-08-24 NOTE — Progress Notes (Unsigned)
 Patient ID: Erik LITTIE Lanius Sr., male   DOB: 09/26/1946, 78 y.o.   MRN: [id number]        Chief Complaint: follow up mouth corner redness burning, fatigue, anal itching, depression       HPI:  Erik EPPLE Sr. is a 78 y.o. male here overall doing ok, but has nightly drooling now with redness burning at the corners of the mouth.  Pt denies chest pain, increased sob or doe, wheezing, orthopnea, PND, increased LE swelling, palpitations, dizziness or syncope.  Does have mild intermittent anal itching, hard to put up with.  Denies worsening depressive symptoms, suicidal ideation, or panic; has ongoing anxiety, has psychiatry appt for nov 2025. Did start mirtazipine and seemed to be helping for a short time, then not so he stopped.  Does not want increased dose.    Does c/o ongoing fatigue, but denies signficant daytime hypersomnolence.        Wt Readings from Last 3 Encounters:  08/24/24 173 lb (78.5 kg)  08/05/24 172 lb (78 kg)  07/06/24 170 lb (77.1 kg)   BP Readings from Last 3 Encounters:  08/24/24 126/82  08/05/24 (!) 154/76  07/06/24 122/72         Past Medical History:  Diagnosis Date   Allergic rhinitis    Allergy     Anal fissure    Anemia    Anxiety    Blood transfusion without reported diagnosis 12/06/2022   also 2 in the past   Cataract    starting   Coronary atherosclerosis of native coronary artery 2007   nonobstructive CAD by cath with 40% mid-LAD stenosis   Degenerative arthritis of knee, bilateral 10/09/2017   Depression    Diverticulosis 12/18/2011   Diverticulosis of colon with hemorrhage    April 2021, [facility]   Duodenal mass    Fibromyalgia    GERD with stricture    Helicobacter pylori (H. 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   Vitamin D  deficiency 11/29/2017   Past Surgical History:  Procedure Laterality Date   ADENOIDECTOMY     bladder distention     x 6   CARDIAC CATHETERIZATION     CATHETER REMOVAL     super pubic area   COLONOSCOPY     CORONARY STENT INTERVENTION N/A 05/29/2022   Procedure: CORONARY STENT INTERVENTION;  Surgeon: Swaziland, Peter M, MD;  Location: [facility] INVASIVE CV LAB;  Service: Cardiovascular;  Laterality: N/A;   DENTAL SURGERY Right    #30 tooth extraction (right upper)   ESOPHAGOGASTRODUODENOSCOPY  12/17/2022   INTERSTIM IMPLANT PLACEMENT     INTERSTIM IMPLANT REMOVAL     KNEE ARTHROSCOPY     right x 2   LEFT HEART CATH AND CORONARY ANGIOGRAPHY N/A 05/29/2022   Procedure: LEFT HEART CATH AND CORONARY ANGIOGRAPHY;  Surgeon: Swaziland, Peter M, MD;  Location: [facility]  INVASIVE CV LAB;  Service: Cardiovascular;  Laterality: N/A;   NISSEN FUNDOPLICATION  2009   PROSTECTOMY     ROBOT ASSISTED LAPAROSCOPIC COMPLETE CYSTECT ILEAL CONDUIT     SHOULDER ARTHROSCOPY  2010   left   TONSILLECTOMY AND ADENOIDECTOMY  1957   TOTAL KNEE ARTHROPLASTY Right 09/08/2021   Procedure: TOTAL KNEE ARTHROPLASTY;  Surgeon: Duwayne Purchase, MD;  Location: [facility] ORS;  Service: Orthopedics;  Laterality: Right;   TRANSURETHRAL RESECTION OF PROSTATE     x 2   UPPER GASTROINTESTINAL ENDOSCOPY  05/13/2008   hiatal hernia   VASECTOMY      reports that he has quit smoking. He has been exposed to tobacco smoke. He has never used smokeless tobacco. He reports that he does not drink alcohol  and does not use drugs. family history includes Allergic rhinitis in his mother; Arthritis in his mother; Breast cancer in his  sister; Colon polyps in his father; Diabetes in his father, paternal uncle, and sister; Heart disease in his father and mother; Hypertension in his father and mother; Liver cancer in his maternal grandfather and paternal uncle; Stroke in his mother and sister. Allergies  Allergen Reactions   Ambien  [Zolpidem ]     Unknown reaction   Bentyl  [Dicyclomine ]     Memory loss, anxiety, blurred vision   Carafate  [Sucralfate ] Nausea And Vomiting   Lansoprazole  Diarrhea   Lexapro  [Escitalopram  Oxalate]     Unknown reaction    Other     Pt states he is sensitive to all oral medications   Pneumovax [Pneumococcal Polysaccharide Vaccine] Other (See Comments)    pain   Prilosec [Omeprazole ] Other (See Comments)    Per patient joint pain with PPI's   Remeron  [Mirtazapine ]     Unknown reaction    Seroquel  Hana.Grizzle ] Other (See Comments)    Unknown reaction    Statins Other (See Comments)    Joint pain   Tizanidine  Other (See Comments)    Made patient feel crazy   Tramadol  Other (See Comments)    Pt can take small amounts up to twice daily but continued use causes chest / abdominal pain   Tylenol  [Acetaminophen ] Nausea And Vomiting    Can only take for 2 takes then he starts to have pain   Pantoprazole  Rash   Current Outpatient Medications on File Prior to Visit  Medication Sig Dispense Refill   LORazepam  (ATIVAN ) 0.5 MG tablet Take 1 tablet (0.5 mg total) by mouth 2 (two) times daily as needed for anxiety. 60 tablet 5   acetaminophen  (TYLENOL ) 500 MG tablet Take 500 mg by mouth every 6 (six) hours as needed.     amitriptyline  (ELAVIL ) 25 MG tablet Take 1 tablet by mouth at bedtime.     bismuth subsalicylate (PEPTO BISMOL) 262 MG/15ML suspension Take 30 mLs by mouth every 6 (six) hours as needed.     hydrocortisone  (ANUSOL -HC) 2.5 % rectal cream Place 1 Application rectally 2 (two) times daily. 30 g 1   hydrOXYzine  (ATARAX ) 25 MG tablet 1-2 tabs by mouth as needed for itching or sleep 40  tablet 2   Menthol , Topical Analgesic, (BIOFREEZE EX) Apply topically.     nitroGLYCERIN  (NITROSTAT ) 0.4 MG SL tablet Place 1 tablet (0.4 mg total) under the tongue every 5 (five) minutes as needed for chest pain. 90 tablet 3   No current facility-administered medications on file prior to visit.        ROS:  All others reviewed and negative.  Objective  PE:  BP 126/82 (BP Location: Right Arm, Patient Position: Sitting, Cuff Size: Normal)   Pulse 80   Temp 98.9 F (37.2 C) (Oral)   Ht 5' 7 (1.702 m)   Wt 173 lb (78.5 kg)   SpO2 97%   BMI 27.10 kg/m                 Constitutional: Pt appears in NAD               HENT: Head: NCAT.                Right Ear: External ear normal.                 Left Ear: External ear normal.                Eyes: . Pupils are equal, round, and reactive to light. Conjunctivae and EOM are normal               Nose: without d/c or deformity, bilateral mouth corners with redness nontender               Neck: Neck supple. Gross normal ROM               Cardiovascular: Normal rate and regular rhythm.                 Pulmonary/Chest: Effort normal and breath sounds without rales or wheezing.                Abd:  Soft, NT, ND, + BS, no organomegaly               Neurological: Pt is alert. At baseline orientation, motor grossly intact               Skin: Skin is warm. LE edema - none               Psychiatric: Pt behavior is normal without agitation , depressed affect  Micro: none  Cardiac tracings I have personally interpreted today:  none  Pertinent Radiological findings (summarize): none   Lab Results  Component Value Date   WBC 8.6 06/15/2024   HGB 15.7 06/15/2024   HCT 47.1 06/15/2024   PLT 272.0 06/15/2024   GLUCOSE 111 (H) 06/15/2024   CHOL 222 (H) 10/09/2023   TRIG 287.0 (H) 10/09/2023   HDL 38.10 (L) 10/09/2023   LDLDIRECT 169.0 06/24/2018   LDLCALC 126 (H) 10/09/2023   ALT 17 06/15/2024   AST 17 06/15/2024   NA 137 06/15/2024    K 4.2 06/15/2024   CL 101 06/15/2024   CREATININE 1.22 06/15/2024   BUN 17 06/15/2024   CO2 28 06/15/2024   TSH 3.30 10/09/2023   PSA 0.00 (L) 11/28/2022   HGBA1C 6.0 10/09/2023   Assessment/Plan:  Erik LITTIE Lanius Sr. is a 78 y.o. White or Caucasian [1] male with  has a past medical history of Allergic rhinitis, Allergy , Anal fissure, Anemia, Anxiety, Blood transfusion without reported diagnosis (12/06/2022), Cataract, Coronary atherosclerosis of native coronary artery (2007), Degenerative arthritis of knee, bilateral (10/09/2017), Depression, Diverticulosis (12/18/2011), Diverticulosis of colon with hemorrhage, Duodenal mass, Fibromyalgia, GERD with stricture, Helicobacter pylori (H. pylori) infection (11/25/2012), Hiatal hernia, HLD (hyperlipidemia), HTN (hypertension), Hyperlipidemia, IBS (irritable bowel syndrome), Impotence of organic origin, Insomnia, Internal and external hemorrhoids without complication, Interstitial cystitis, Irritable bowel syndrome (06/09/2010), Kidney stone, Osteoporosis, Pelvic floor dysfunction (11/29/2017), Pneumonia, Reactive hypoglycemia, Rheumatoid arthritis(714.0), Somatization disorder (02/27/2012), and Vitamin D   deficiency (11/29/2017).  Fatigue Also for cortisol level,  to f/u any worsening symptoms or concerns  Cheilitis C/w likely fungal infection, for nsytatin oral qid limited rx, also ketocon cr every day to affected areas prn  Anal itching Pt enouraged to try otc benadryl  cream prn asd  Depression Denies SI or HI, declines new med start today, mild to mod, for f/u psychiatry nov 2025 as planned  Followup: Return in about 6 months (around 02/22/2025).  Lynwood Rush, MD 08/27/2024 5:38 AM [facility]

## 2024-08-26 ENCOUNTER — Other Ambulatory Visit (HOSPITAL_COMMUNITY): Payer: Self-pay

## 2024-08-26 ENCOUNTER — Other Ambulatory Visit: Payer: Self-pay | Admitting: Internal Medicine

## 2024-08-26 MED ORDER — KETOCONAZOLE 2 % EX CREA: 1.0000 | TOPICAL_CREAM | Freq: Every day | CUTANEOUS | 1 refills | Status: AC

## 2024-08-26 NOTE — Telephone Encounter (Signed)
 Ok to corners of the mouth  I did a repeat rx with these instructions     thanks

## 2024-08-26 NOTE — Telephone Encounter (Unsigned)
 Copied from CRM [id number]. Topic: Clinical - Medication Question >> Aug 25, 2024  4:34 PM Tinnie BROCKS wrote: Reason for CRM: Hadassah with [facility] is calling to get rx clarification on where the ketoconazole  (NIZORAL ) 2 % cream needs to be applied. Please contact pharmacy: [facility] - [place] - [address] [place] [address] Phone: [phone] Fax: [phone]

## 2024-08-27 ENCOUNTER — Encounter (HOSPITAL_BASED_OUTPATIENT_CLINIC_OR_DEPARTMENT_OTHER): Payer: Self-pay

## 2024-08-27 ENCOUNTER — Encounter: Payer: Self-pay | Admitting: Internal Medicine

## 2024-08-27 ENCOUNTER — Ambulatory Visit: Payer: Self-pay | Admitting: Internal Medicine

## 2024-08-27 ENCOUNTER — Other Ambulatory Visit (INDEPENDENT_AMBULATORY_CARE_PROVIDER_SITE_OTHER)

## 2024-08-27 DIAGNOSIS — R5383 Other fatigue: Secondary | ICD-10-CM

## 2024-08-27 DIAGNOSIS — K130 Diseases of lips: Secondary | ICD-10-CM | POA: Insufficient documentation

## 2024-08-27 LAB — CORTISOL: Cortisol, Plasma: 18.2 ug/dL

## 2024-08-27 NOTE — Assessment & Plan Note (Signed)
 Pt enouraged to try otc benadryl  cream prn asd

## 2024-08-27 NOTE — Assessment & Plan Note (Signed)
Also for cortisol level,  to f/u any worsening symptoms or concerns

## 2024-08-27 NOTE — Assessment & Plan Note (Signed)
 Denies SI or HI, declines new med start today, mild to mod, for f/u psychiatry nov 2025 as planned

## 2024-08-27 NOTE — Telephone Encounter (Signed)
 I know this is a Candis pt but she's not back till Monday can you advise on which medication pt should take the night of his study?

## 2024-08-27 NOTE — Assessment & Plan Note (Signed)
 C/w likely fungal infection, for nsytatin oral qid limited rx, also ketocon cr every day to affected areas prn

## 2024-08-28 ENCOUNTER — Encounter: Payer: Self-pay | Admitting: Physician Assistant

## 2024-08-28 ENCOUNTER — Ambulatory Visit: Attending: Physician Assistant | Admitting: Physician Assistant

## 2024-08-28 VITALS — BP 116/58 | HR 89 | Ht 67.0 in | Wt 172.4 lb

## 2024-08-28 DIAGNOSIS — I10 Essential (primary) hypertension: Secondary | ICD-10-CM

## 2024-08-28 DIAGNOSIS — R0602 Shortness of breath: Secondary | ICD-10-CM

## 2024-08-28 DIAGNOSIS — E785 Hyperlipidemia, unspecified: Secondary | ICD-10-CM

## 2024-08-28 DIAGNOSIS — I2510 Atherosclerotic heart disease of native coronary artery without angina pectoris: Secondary | ICD-10-CM | POA: Diagnosis not present

## 2024-08-28 DIAGNOSIS — R7989 Other specified abnormal findings of blood chemistry: Secondary | ICD-10-CM | POA: Diagnosis not present

## 2024-08-28 DIAGNOSIS — I200 Unstable angina: Secondary | ICD-10-CM

## 2024-08-28 NOTE — Progress Notes (Unsigned)
 Cardiology Office Note   Date:  08/30/2024  ID:  Erik LITTIE Lanius Sr., DOB Aug 17, 1946, MRN [id number] PCP: Erik Lynwood ORN, MD  [facility] Providers Cardiologist:  Erik ONEIDA Decent, MD     History of Present Illness Erik EIDEM Sr. is a 78 y.o. male with past medical history of CAD s/p PCI proLAD 05/28/2022, HTN, HLD, GI bleed and history of SVT.  The patient is intolerant of statins and Zetia .  He had massive GI bleed at [facility] in 2021.  EGD in March 2023 showed gastritis.  Echocardiogram obtained on 05/21/2022 showed EF of 60 to 65%, no regional wall motion abnormality, grade 1 DD, RVSP 27.4 mmHg, normal RV, trivial MR, mild AR.  Patient had GI bleed in October 2023 and was admitted at [facility].  No source of GI bleed was identified.  Cardiac catheterization performed in July 2023 showed 70 to 99% proximal LAD lesion with FFR of 0.57, this was treated with a 3.0 x 20 mm Synergy DES, patient had 50% mid left circumflex and a 50% ostial RCA residual.  Heart monitor obtained in January 2024 showed brief SVT, longest duration 6.3 seconds, primarily sinus rhythm with average heart rate of 74 bpm, PVC burden less than 1%.  Cardiac PET stress test obtained on 08/29/2023 showed no evidence of ischemia or infarction, EF 60%, overall low risk study.  Since the last visit, patient was seen in the ED in May 2025 for acute pyelonephritis.  He was most recently seen at [facility] ED on 08/19/2024 due to dyspnea.  This symptom primarily occurs with exertion.  He also noted mild pedal edema.  D-dimer was mildly elevated to 713.  Troponin negative.  Hemoglobin 15.1, white blood cell count 9.6.  Creatinine 1.2 normal liver enzyme.  Chest x-ray obtained on 08/19/2024 showed mild left basilar subsegmental atelectasis in the right acromioclavicular osteoarthritis, no other acute process.  Patient presents today for evaluation of shortness of breath and fatigue along with wife.  He has chronic abdominal  discomfort, this has not worsened recently.  He denies any fever chills or cough.  He had shortness of breath with activity for the past several weeks, this improved however has not completely resolved.  D-dimer was elevated during the recent ED visit, I will order a CTA of the chest with PE protocol.  Given prior cardiac history, I will also order echocardiogram and a PET stress test as well.  He denies any recent chest pain, then again he never had chest pain even prior to the previous cardiac intervention in 2023 either.  If all test come back okay, he can follow-up with Dr. Decent in 2 months.  ROS:   Patient complains of dyspnea.  He denies any chest pain.  Studies Reviewed EKG Interpretation Date/Time:  Friday August 28 2024 14:48:11 EDT Ventricular Rate:  89 PR Interval:  162 QRS Duration:  90 QT Interval:  372 QTC Calculation: 452 R Axis:   -8  Text Interpretation: Normal sinus rhythm  No significant ST-T wave changes Confirmed by [name] [phone]) on 08/30/2024 10:54:22 PM    Cardiac Studies & Procedures   ______________________________________________________________________________________________ CARDIAC CATHETERIZATION  CARDIAC CATHETERIZATION 05/29/2022  Conclusion   Ost RCA lesion is 50% stenosed.   Prox LAD to Mid LAD lesion is 70% stenosed.   Prox LAD lesion is 90% stenosed.   Mid Cx lesion is 50% stenosed.   A drug-eluting stent was successfully placed using a SYNERGY XD 3.0X20.  Post intervention, there is a 0% residual stenosis.   Post intervention, there is a 0% residual stenosis.   LV end diastolic pressure is normal.  Single vessel obstructive CAD Normal LVEDP Successful PCI of the proximal to mid LAD with OCT guidance and DES x 1  Plan: DAPT for 6 months. Anticipate same day DC.  Findings Coronary Findings Diagnostic  Dominance: Right  Left Anterior Descending Prox LAD lesion is 90% stenosed. The lesion is moderately calcified. Optical coherence  tomography (OCT) was performed. Prox LAD to Mid LAD lesion is 70% stenosed.  Left Circumflex Mid Cx lesion is 50% stenosed.  Right Coronary Artery Ost RCA lesion is 50% stenosed.  Intervention  Prox LAD lesion Stent (Also treats lesions: Prox LAD to Mid LAD) CATH VISTA GUIDE 6FR XBLAD3.5 guide catheter was inserted. Lesion crossed with guidewire using a WIRE ASAHI PROWATER 180CM. Pre-stent angioplasty was performed using a BALLN SAPPHIRE 2.5X12. A drug-eluting stent was successfully placed using a SYNERGY XD 3.0X20. Stent strut is well apposed. Post-stent angioplasty was performed using a BALL SAPPHIRE NC24 3.5X12. Maximum pressure:  20 atm. Post-Intervention Lesion Assessment The intervention was successful. Pre-interventional TIMI flow is 3. Post-intervention TIMI flow is 3. No complications occurred at this lesion. Optical coherence tomography (OCT) was performed. Minimum stent area 7.5 mm. There is a 0% residual stenosis post intervention.  Prox LAD to Mid LAD lesion Stent (Also treats lesions: Prox LAD) See details in Prox LAD lesion. Post-Intervention Lesion Assessment The intervention was successful. Pre-interventional TIMI flow is 3. Post-intervention TIMI flow is 3. No complications occurred at this lesion. There is a 0% residual stenosis post intervention.   STRESS TESTS  NM PET CT CARDIAC PERFUSION MULTI W/ABSOLUTE BLOODFLOW 08/29/2023  Narrative   LV perfusion is normal. There is no evidence of ischemia. There is no evidence of infarction.   Rest left ventricular function is normal. Rest EF: 60%. Stress left ventricular function is normal. Stress EF: 68%. End diastolic cavity size is normal. End systolic cavity size is normal.   Myocardial blood flow was computed to be 0.43ml/g/min at rest and 2.60ml/g/min at stress. Global myocardial blood flow reserve was 2.77 and was normal.   Not commented on due to prior LaD stent   The study is normal. The study is low  risk.  CLINICAL DATA:  This over-read does not include interpretation of cardiac or coronary anatomy or pathology. The cardiac PET-CT interpretation by the cardiologist is attached.  COMPARISON:  None Available.  FINDINGS: No suspicious nodules, masses, or infiltrates are identified in the visualized portion of the lungs. No pleural fluid seen.  The visualized portions of the mediastinum and chest wall are unremarkable. Small hiatal hernia is seen.  IMPRESSION: Small hiatal hernia.   Electronically Signed By: [name] M.D. On: 08/29/2023 12:32   ECHOCARDIOGRAM  ECHOCARDIOGRAM COMPLETE 05/21/2022  Narrative ECHOCARDIOGRAM REPORT    Patient Name:   Erik TARVER Sr. Date of Exam: 05/21/2022 Medical Rec #:  [id number]          Height:       66.0 in Accession #:    [id number]         Weight:       168.4 lb Date of Birth:  [birth year]           BSA:          1.859 m Patient Age:    76 years           BP:  128/62 mmHg Patient Gender: M                  HR:           79 bpm. Exam Location:  Outpatient  Procedure: 2D Echo, Cardiac Doppler and Color Doppler  Indications:    Chest Pain R07.2  History:        Patient has no prior history of Echocardiogram examinations. Risk Factors:Hypertension and Dyslipidemia.  Sonographer:    [name] RDCS Referring Phys: [id number] [facility] O'NEAL  IMPRESSIONS   1. Left ventricular ejection fraction, by estimation, is 60 to 65%. The left ventricle has normal function. The left ventricle has no regional wall motion abnormalities. Left ventricular diastolic parameters are consistent with Grade I diastolic dysfunction (impaired relaxation). 2. Right ventricular systolic function is normal. The right ventricular size is normal. There is normal pulmonary artery systolic pressure. The estimated right ventricular systolic pressure is 27.4 mmHg. 3. The mitral valve is grossly normal. Trivial mitral valve regurgitation. No  evidence of mitral stenosis. 4. The aortic valve is tricuspid. Aortic valve regurgitation is mild. No aortic stenosis is present. 5. The inferior vena cava is dilated in size with >50% respiratory variability, suggesting right atrial pressure of 8 mmHg.  FINDINGS Left Ventricle: Left ventricular ejection fraction, by estimation, is 60 to 65%. The left ventricle has normal function. The left ventricle has no regional wall motion abnormalities. The left ventricular internal cavity size was normal in size. There is no left ventricular hypertrophy. Left ventricular diastolic parameters are consistent with Grade I diastolic dysfunction (impaired relaxation).  Right Ventricle: The right ventricular size is normal. No increase in right ventricular wall thickness. Right ventricular systolic function is normal. There is normal pulmonary artery systolic pressure. The tricuspid regurgitant velocity is 2.20 m/s, and with an assumed right atrial pressure of 8 mmHg, the estimated right ventricular systolic pressure is 27.4 mmHg.  Left Atrium: Left atrial size was normal in size.  Right Atrium: Right atrial size was normal in size.  Pericardium: There is no evidence of pericardial effusion.  Mitral Valve: The mitral valve is grossly normal. Trivial mitral valve regurgitation. No evidence of mitral valve stenosis.  Tricuspid Valve: The tricuspid valve is grossly normal. Tricuspid valve regurgitation is not demonstrated. No evidence of tricuspid stenosis.  Aortic Valve: The aortic valve is tricuspid. Aortic valve regurgitation is mild. No aortic stenosis is present.  Pulmonic Valve: The pulmonic valve was grossly normal. Pulmonic valve regurgitation is trivial. No evidence of pulmonic stenosis.  Aorta: The aortic root and ascending aorta are structurally normal, with no evidence of dilitation.  Venous: The inferior vena cava is dilated in size with greater than 50% respiratory variability, suggesting right  atrial pressure of 8 mmHg.  IAS/Shunts: The atrial septum is grossly normal.   LEFT VENTRICLE PLAX 2D LVIDd:         4.00 cm     Diastology LVIDs:         2.20 cm     LV e' medial:    5.61 cm/s LV PW:         1.00 cm     LV E/e' medial:  11.0 LV IVS:        1.00 cm     LV e' lateral:   8.76 cm/s LVOT diam:     2.20 cm     LV E/e' lateral: 7.0 LV SV:         67 LV SV Index:  36 LVOT Area:     3.80 cm  LV Volumes (MOD) LV vol d, MOD A2C: 80.6 ml LV vol d, MOD A4C: 75.6 ml LV vol s, MOD A2C: 27.5 ml LV vol s, MOD A4C: 28.9 ml LV SV MOD A2C:     53.1 ml LV SV MOD A4C:     75.6 ml LV SV MOD BP:      50.6 ml  RIGHT VENTRICLE RV Basal diam:  3.60 cm RV Mid diam:    3.20 cm RV S prime:     9.73 cm/s TAPSE (M-mode): 1.8 cm  LEFT ATRIUM             Index        RIGHT ATRIUM           Index LA diam:        2.60 cm 1.40 cm/m   RA Area:     11.30 cm LA Vol (A2C):   24.0 ml 12.91 ml/m  RA Volume:   22.00 ml  11.84 ml/m LA Vol (A4C):   35.3 ml 18.99 ml/m LA Biplane Vol: 29.7 ml 15.98 ml/m AORTIC VALVE LVOT Vmax:   80.90 cm/s LVOT Vmean:  50.600 cm/s LVOT VTI:    0.176 m  AORTA Ao Root diam: 3.90 cm Ao Asc diam:  3.60 cm  MITRAL VALVE               TRICUSPID VALVE MV Area (PHT): 3.72 cm    TR Peak grad:   19.4 mmHg MV Decel Time: 204 msec    TR Vmax:        220.00 cm/s MV E velocity: 61.70 cm/s MV A velocity: 57.40 cm/s  SHUNTS MV E/A ratio:  1.07        Systemic VTI:  0.18 m Systemic Diam: 2.20 cm  Erik Decent MD Electronically signed by Erik Decent MD Signature Date/Time: 05/21/2022/7:44:50 PM    Final    MONITORS  LONG TERM MONITOR (3-14 DAYS) 12/04/2022  Narrative Patch Wear Time:  7 days and 23 hours (2024-01-08T15:37:28-0500 to 2024-01-16T14:42:25-0500)  Patient had a min HR of 50 bpm (sinus bradycardia), max HR of 152 bpm (supraventricular tachycardia), and avg HR of 74 bpm (normal sinus rhythm). Predominant underlying rhythm was Sinus Rhythm. 3  Supraventricular Tachycardia runs occurred, the run with the fastest interval lasting 13 beats (6.3 second duration) with a max rate of 152 bpm (avg 126 bpm); the run with the fastest interval was also the longest. Supraventricular Tachycardia was detected within +/- 45 seconds of symptomatic patient event(s). Isolated SVEs were rare (<1.0%), SVE Couplets were rare (<1.0%), and SVE Triplets were rare (<1.0%). Isolated VEs were rare (<1.0%), and no VE Couplets or VE Triplets were present.  Impression: Brief supraventricular tachycardia was detected (3 episodes in 7 days; longest duration 6.3 seconds). Rare ectopy.  Erik T. Decent, MD, [facility]  [facility] [address], [address] [place], [place] [address] [phone] 2:36 PM   CT SCANS  CT CORONARY FRACTIONAL FLOW RESERVE DATA PREP 05/16/2022  Narrative EXAM: CT FFR analysis was performed on the original cardiac CTA dataset. Diagrammatic representation of the CT FFR analysis is provided in a separate PDF document in PACS. This dictation was created using the PDF document and an interactive 3D model of the results. The 3D model is not available in the EMR/PACS.  INTERPRETATION: CT FFR provides simultaneous calculation of pressure and flow across the entire coronary tree. For clinical decision making,  CT FFR values should be obtained 1-2 cm distal to the lower border of each stenosis measured. Coronary CTA-related artifacts may impair the diagnostic accuracy of the original cardiac CTA and FFR CT results. *Due to the fact that CT FFR represents a mathematically-derived analysis, it is recommended that the results be interpreted as follows:  1. CT FFR >0.80: Low likelihood of hemodynamic significance. 2. CT FFR 0.76-0.80: Borderline likelihood of hemodynamic significance. 3. CT FFR =< 0.75: High likelihood of hemodynamic significance.  *Coronary CT Angiography-derived Fractional Flow Reserve Testing in Patients  with Stable Coronary Artery Disease: Recommendations on Interpretation and Reporting. Radiology: Cardiothoracic Imaging. 2019;1(5):e190050  FINDINGS: 1. Left Main: 0.99; low likelihood of hemodynamic significance. 2. Prox LAD: 0.57; low likelihood of hemodynamic significance.  3. LCX: 0.91; low likelihood of hemodynamic significance. 4. RCA: 0.88; low likelihood of hemodynamic significance.  IMPRESSION:  1.  Flow limiting stenosis in the proximal LAD (CT FFR 0.57).  Erik Decent, MD   Electronically Signed By: [name] M.D. On: 05/16/2022 17:32   CT SCANS  CT CORONARY MORPH W/CTA COR W/SCORE 05/16/2022  Addendum 05/16/2022  5:30 PM ADDENDUM REPORT: 05/16/2022 17:28  CLINICAL DATA:  Chest pain  EXAM: Cardiac/Coronary CTA  TECHNIQUE: A non-contrast, gated CT scan was obtained with axial slices of 3 mm through the heart for calcium scoring. Calcium scoring was performed using the Agatston method. A 120 kV prospective, gated, contrast cardiac scan was obtained. Gantry rotation speed was 250 msecs and collimation was 0.6 mm. Two sublingual nitroglycerin  tablets (0.8 mg) were given. The 3D data set was reconstructed in 5% intervals of the 35-75% of the R-R cycle. Diastolic phases were analyzed on a dedicated workstation using MPR, MIP, and VRT modes. The patient received 95 cc of contrast.  FINDINGS: Image quality: Excellent.  Noise artifact is: Limited.  Coronary Arteries:  Normal coronary origin.  Right dominance.  Left main: The left main is a large caliber vessel with a normal take off from the left coronary cusp that bifurcates to form a left anterior descending artery and a left circumflex artery. There is minimal mixed density plaque (<25%).  Left anterior descending artery: The proximal LAD contains a severe mixed density plaque (70-99%). The mid and distal segments are patent. D1 is patent. D2 contains minimal non-calcified plaque (<25%).  Left  circumflex artery: The LCX is non-dominant. The proximal and mid segments contains minimal mixed density plaque (<25%). OM1 contains minimal mixed density plaque (<25%). OM2 is patent.  Right coronary artery: The RCA is dominant with normal take off from the right coronary cusp. There is minimal calcified plaque (<25%). The RCA terminates as a PDA and right posterolateral branch without evidence of plaque or stenosis.  Right Atrium: Right atrial size is within normal limits.  Right Ventricle: The right ventricular cavity is within normal limits.  Left Atrium: Left atrial size is normal in size with no left atrial appendage filling defect.  Left Ventricle: The ventricular cavity size is within normal limits.  Pulmonary arteries: Normal in size without proximal filling defect.  Pulmonary veins: Normal pulmonary venous drainage.  Pericardium: Normal thickness without significant effusion or calcium present.  Cardiac valves: The aortic valve is trileaflet without significant calcification. The mitral valve is normal without significant calcification.  Aorta: Normal caliber without significant disease.  Extra-cardiac findings: See attached radiology report for non-cardiac structures.  IMPRESSION: 1. Coronary calcium score of 675. This was 70th percentile for age-, sex, and race-matched controls. Plaque volume 549 mm3.  2. Normal coronary origin with right dominance.  3. Severe stenosis in the proximal LAD (70-99%).  4. Minimal CAD (<25%) in the LCX/RCA.  RECOMMENDATIONS: 1. Severe stenosis in the proximal LAD. (70-99% or > 50% left main). Cardiac catheterization or CT FFR is recommended. Consider symptom-guided anti-ischemic pharmacotherapy as well as risk factor modification per guideline directed care. Invasive coronary angiography recommended with revascularization per published guideline statements.  Erik Decent, MD   Electronically Signed By: [name]  M.D. On: 05/16/2022 17:28  Narrative EXAM: OVER-READ INTERPRETATION  CT CHEST  The following report is a limited chest CT over-read performed by radiologist Dr. MYRTIS Stammer of [facility] on 05/16/2022. The coronary CTA interpretation by the cardiologist is attached.  COMPARISON:  None Available.  FINDINGS: Vascular: Normal caliber thoracic aorta.  No dissection.  Mediastinum/Nodes: No mediastinal or hilar mass or lymphadenopathy. The esophagus is grossly normal. There is a small hiatal hernia noted.  Lungs/Pleura: No acute pulmonary process. No pleural effusions. Mild dependent subpleural atelectasis. No worrisome pulmonary lesions or pulmonary nodules.  Upper Abdomen: No significant upper abdominal findings.  Musculoskeletal: No significant bony findings.  IMPRESSION: No significant extracardiac findings.  Electronically Signed: By: MYRTIS Stammer M.D. On: 05/16/2022 16:16     ______________________________________________________________________________________________      Risk Assessment/Calculations          Physical Exam VS:  BP (!) 116/58   Pulse 89   Ht 5' 7 (1.702 m)   Wt 172 lb 6.4 oz (78.2 kg)   SpO2 96%   BMI 27.00 kg/m        Wt Readings from Last 3 Encounters:  08/28/24 172 lb 6.4 oz (78.2 kg)  08/24/24 173 lb (78.5 kg)  08/05/24 172 lb (78 kg)    GEN: Well nourished, well developed in no acute distress NECK: No JVD; No carotid bruits CARDIAC: RRR, no murmurs, rubs, gallops RESPIRATORY:  Clear to auscultation without rales, wheezing or rhonchi  ABDOMEN: Soft, non-tender, non-distended EXTREMITIES:  No edema; No deformity   ASSESSMENT AND PLAN  Shortness of breath: Unclear cause.  Patient was seen in [facility] ED.  D-dimer was elevated, will obtain CTA of the chest to rule out PE.  Will also obtain echocardiogram and PET stress test.  He denies any fever or chill.  Elevated D-dimer: Obtain CTA of the chest to rule out  PE  CAD: Denies any chest pain, however he never had chest pain given prior to the previous PCI.  Will obtain PET stress test given recent onset of shortness of breath  Hypertension: Blood pressure well-controlled  Hyperlipidemia: Intolerant of statins    Informed Consent   Shared Decision Making/Informed Consent The risks [chest pain, shortness of breath, cardiac arrhythmias, dizziness, blood pressure fluctuations, myocardial infarction, stroke/transient ischemic attack, nausea, vomiting, allergic reaction, radiation exposure, metallic taste sensation and life-threatening complications (estimated to be 1 in 10,000)], benefits (risk stratification, diagnosing coronary artery disease, treatment guidance) and alternatives of a cardiac PET stress test were discussed in detail with Mr. Alperin and he agrees to proceed.     Dispo: Follow-up with Dr. Decent in 2 months  Signed, Hero Mccathern, PA

## 2024-08-28 NOTE — Telephone Encounter (Signed)
 Pt states he cannot sleep without his meds, Im not sure which is better for him to take

## 2024-08-28 NOTE — Patient Instructions (Signed)
 Medication Instructions:  Your physician recommends that you continue on your current medications as directed. Please refer to the Current Medication list given to you today.  *If you need a refill on your cardiac medications before your next appointment, please call your pharmacy*   Testing/Procedures: Your physician has requested that you have an echocardiogram. Echocardiography is a painless test that uses sound waves to create images of your heart. It provides your doctor with information about the size and shape of your heart and how well your heart's chambers and valves are working. This procedure takes approximately one hour. There are no restrictions for this procedure. Please do NOT wear cologne, perfume, aftershave, or lotions (deodorant is allowed). Please arrive 15 minutes prior to your appointment time.  Please note: We ask at that you not bring children with you during ultrasound (echo/ vascular) testing. Due to room size and safety concerns, children are not allowed in the ultrasound rooms during exams. Our front office staff cannot provide observation of children in our lobby area while testing is being conducted. An adult accompanying a patient to their appointment will only be allowed in the ultrasound room at the discretion of the ultrasound technician under special circumstances. We apologize for any inconvenience.    Your physician has requested you have a cardiac PET stress CT scan performed. These are completed at [facility]. A scheduler will call you to schedule an appointment to have this test completed.      Follow-Up: At [facility], you and your health needs are our priority.  As part of our continuing mission to provide you with exceptional heart care, our providers are all part of one team.  This team includes your primary Cardiologist (physician) and Advanced Practice Providers or APPs (Physician Assistants and Nurse Practitioners) who all work  together to provide you with the care you need, when you need it.  Your next appointment:   2 month(s)  Provider:   Darryle ONEIDA Decent, MD or Hao Meng, PA-C

## 2024-08-31 NOTE — Telephone Encounter (Signed)
 These are only recommendations; he can take his medications if he wants to, just knowing that it may affect results.  There are no other recommendations past this.

## 2024-08-31 NOTE — Telephone Encounter (Signed)
 He may take the Gabapentin  and Tramadol  for pain management.  It is not recommended to take sedating medications as it may affect the test results.

## 2024-08-31 NOTE — Telephone Encounter (Signed)
**Note De-identified  [place] Obfuscation** Please advise 

## 2024-09-02 ENCOUNTER — Encounter

## 2024-09-02 ENCOUNTER — Telehealth: Payer: Self-pay

## 2024-09-02 DIAGNOSIS — G4719 Other hypersomnia: Secondary | ICD-10-CM

## 2024-09-02 NOTE — Telephone Encounter (Signed)
 I understand pt concern, but I believe he is referring to cortisol levels.  With 2 recent normal cortisol, I would not think that he needs endo referral, so we can hold off on this for now.   thanks

## 2024-09-02 NOTE — Telephone Encounter (Signed)
 Copied from CRM [id number]. Topic: Referral - Question >> Sep 02, 2024  1:50 PM Sasha M wrote: Reason for CRM: Tonya calling [facility] on behalf of pt because pt is requesting a referral to an endocrinologist because he is concerned about his levels. Please call pt to advise next steps

## 2024-09-03 ENCOUNTER — Ambulatory Visit (HOSPITAL_COMMUNITY)
Admission: RE | Admit: 2024-09-03 | Discharge: 2024-09-03 | Disposition: A | Discharge status: Home / Self Care | Source: Ambulatory Visit | Attending: Physician Assistant | Admitting: Physician Assistant

## 2024-09-03 ENCOUNTER — Ambulatory Visit: Payer: Self-pay | Admitting: Physician Assistant

## 2024-09-03 DIAGNOSIS — R0602 Shortness of breath: Secondary | ICD-10-CM | POA: Diagnosis not present

## 2024-09-03 DIAGNOSIS — R7989 Other specified abnormal findings of blood chemistry: Secondary | ICD-10-CM | POA: Insufficient documentation

## 2024-09-03 DIAGNOSIS — R5383 Other fatigue: Secondary | ICD-10-CM | POA: Diagnosis not present

## 2024-09-03 MED ORDER — IOHEXOL 350 MG/ML SOLN
100.0000 mL | Freq: Once | INTRAVENOUS | Status: AC | PRN
  Administered 2024-09-03: 100 mL via INTRAVENOUS

## 2024-09-08 NOTE — Telephone Encounter (Signed)
 Pt states understanding no further questions at this time.

## 2024-09-10 ENCOUNTER — Telehealth: Payer: Self-pay | Admitting: Physician Assistant

## 2024-09-10 DIAGNOSIS — R069 Unspecified abnormalities of breathing: Secondary | ICD-10-CM | POA: Diagnosis not present

## 2024-09-10 DIAGNOSIS — R5383 Other fatigue: Secondary | ICD-10-CM

## 2024-09-10 NOTE — Telephone Encounter (Signed)
 [name], called wanting to know if we can put in a referral for an endocrinologist. She states his cortisol levels keep going up and patient wants to see an endocrinologist.

## 2024-09-10 NOTE — Telephone Encounter (Signed)
 Left message for Bascom informing her referral has been ordered.  Also sent MyChart message to patient.

## 2024-09-11 NOTE — Progress Notes (Signed)
 Psychiatric Initial Adult Assessment  Patient Identification: Erik CHARO Sr. MRN:  [id number] Date of Evaluation:  09/14/2024 Referral Source: [facility] - depression with SI  Assessment:  Erik HOHMANN Sr. is a 78 y.o. male with a history of MDD, somatization disorder, 1 prior suicide attempt and hospitalization who presents in person to [facility] for initial evaluation of medications and mood.  Patient reports symptoms consistent with a longstanding history of depression, has multiple negative ruminations and anhedonia, changes in sleep and passive SI in response to his chronic physical pain. He has had multiple medication trials with noted side effects as below. Given his chronic pain in addition to his depression and patient's prior history with cymbalta , we discussed a trial of effexor for his depression and pain. The risks/benefits/side-effects/alternatives to this medication were discussed in detail with the patient and time was given for questions. The patient consents to medication trial. We discussed the risk of elevated blood pressure and for patient to continue to monitor. We discussed alternatives such as zoloft however patient was concerned about GI side effects. We also discussed setting patient up with therapy services at this clinic. Patient agreed to therapy services. Patient to follow-up in 4 weeks.   Risk Assessment: A suicide and violence risk assessment was performed as part of this evaluation. There patient is deemed to be at chronic elevated risk for self-harm/suicide given the following factors: suicidal ideation or threats without a plan, previous suicide attempt(s), and chronic severe medical condition. These risk factors are mitigated by the following factors: lack of active SI/HI, no known access to weapons or firearms, supportive family, sense of responsibility to family and social supports, presence of a significant relationship, presence of an  available support system, current treatment compliance, and safe housing. The patient is deemed to be at chronic elevated risk for violence given the following factors: N/A. These risk factors are mitigated by the following factors: no known history of violence towards others, positive social orientation, and connectedness to family. There is no acute risk for suicide or violence at this time. The patient was educated about relevant modifiable risk factors including following recommendations for treatment of psychiatric illness and abstaining from substance abuse.   While future psychiatric events cannot be accurately predicted, the patient does not currently require  acute inpatient psychiatric care and does not currently meet [place]  involuntary commitment criteria.    Plan:  # Persistent Depressive Disorder -- start effexor XR 37.5mg  with breakfast for mood symptoms  -- start therapy  # Generalized Anxiety Disorder --effexor as above -- continue hydroxyzine  25mg  BID PRN for increased anxiety --continue with stopping ativan  PRN for anxiety  Vit D low: rec oral replacement Chronic pain: on gabapetin and tramadol   Patient was given contact information for behavioral health clinic and was instructed to call 911 for emergencies.   Return to care in: Future Appointments  Date Time Provider Department Center  09/22/2024 11:30 AM [facility] [place]  1 CV-ASH None  09/29/2024  1:00 PM Garrot, Elsie SAILOR, LCSW BH-OPGSO None  09/30/2024  2:00 PM Charley Conger, PA-C [id number] [id number]  10/12/2024  3:30 PM Graham Krabbe, MD BH-BHCA None  10/28/2024  1:30 PM Janene Boer, PA CVD-MAGST H&V  01/07/2025 11:30 AM [facility]-ANNUAL WELLNESS VISIT 2 [facility]  01/11/2025  1:30 PM [facility] LAB [facility]MED[facility] None  01/11/2025  2:00 PM Loretha Ash, MD [facility] None    Patient was given contact information for behavioral health clinic  and was instructed to call 911 for emergencies.     Patient and plan of care will be discussed with the Attending MD, who agrees with the above statement and plan.   Subjective:  Chief Complaint: No chief complaint on file.  History of Present Illness:   Labs:  08/2024 Cr 1.2, LFTs wnl, CBC stable  PDMP: 07/2024 ativan  0.5mg  10# 5 days, 08/2024 ativan  0.5mg  60# 30 days, tramadol  08/2024 28# 7 days and 08/2024 gabapentin  180# 90 days  EKG: 08/2024 Qtc 452  MRI brain / EEG: 11/2022 MRI brain unremarkable, no EEG  Sleep study: 08/2024 OSA   Patient reports mood is neutral. Reports having several low days over the past week, feels anhedonic. Patient reports getting 3-4 hours of sleep a night on average, reports he has not started treatment for OSA yet. Patient reports decreased appetite due to nausea. Patient reports stressors include physical pain and his stoma. Patient reports adherence with medications. Patient reports history of side effects to medications. Patient reports no substance use. Patient reports passive SI daily, denies current increase in SI or active SI. Denies HI/AVH.   Patient reports history of feeling depressed, anhedonia, guilt, increase in sleep, low energy, erratic appetite, and decreased concentration. Patient reports passive suicidal ideation during those times. Reports that he has times when he is in pain where he feels more depressed and has passive SI. Reports has been struggling with pain and passive SI for 5 years. Reports that he feels more depressed when he wakes up with the pain which occurs every morning, reports pain is an uneasy feeling in his gut. Denies having active plan, reports that he has to regroup.   Reports he has had increased energy in 2024. Reports that he has episodes where he will feel that he has increased energy and no problem. Denies impulsive and risky behavior during those times or decreased sleep. Reports he has history of ativan  prescription for his anxiety but he has not taken the ativan  in  2-3 days and expressed a desire to stop ativan  due to effects on his memory. Discussed with patient our recommendation for cessation from benzodiazepines and would not be providing additional prescription in the future especially given his age.   Patient reports history of no excessive worry. Reports a history of panic attacks. Denies feeling restless, having difficulty concentration. However throughout the visit he asks multiple questions about medication side effects and appears to have increased anxiety with regard to medications and side effects as below.   Patient reports no history of abuse.   Patient reports no history of auditory and visual hallucinations.  Patient reports no history of obsessive thoughts and compulsive actions.   PHQ-9: 15  GAD-7: 4  Past Psychiatric History:  Diagnoses: MDD, GAD, somatization disorder Medication trials: remeron  15, celexa  10, elavil , valium , cymbalta  60, lexapro  10, hydroxyzine , seroquel , gabapentin , lyrica , ambien , lunesta    Reports that he is sensitive to medications  Remeron , took for 3 days then around a month: reports feeling crazy- felt closed in, chest pain, SOB, labile blood pressure, constipation.  Lexapro : felt crazy, felt like anything goes and out of control, don't recall time took it    Cymbalta : felt like a zombie, felt crazy  Celexa : does not remember    Hydroxyzine : feels sedated, take it intermittently   Lorazepam  (states 18 years), takes 0.25mg , reports is affecting his memory    Restarted gabapentin  6-7 days ago Previous psychiatrist/therapist: Richerd Ling - therapist, also saw [facility] Hasten psychologist  previously, not currently seeing psychologist  Hospitalizations: reports in 67 when wife left him for 3 weeks  Suicide attempts: yes, overdose on medications in 1986  SIB: denies  Hx of violence towards others: denies  Current access to guns: denies  Hx of trauma/abuse: denies   Substance Abuse History in the  last 12 months:  No. Denies substance use   Past Medical History:  Past Medical History:  Diagnosis Date   Allergic rhinitis    Allergy     Anal fissure    Anemia    Anxiety    Blood transfusion without reported diagnosis 12/06/2022   also 2 in the past   Cataract    starting   Coronary atherosclerosis of native coronary artery 2007   nonobstructive CAD by cath with 40% mid-LAD stenosis   Degenerative arthritis of knee, bilateral 10/09/2017   Depression    Diverticulosis 12/18/2011   Diverticulosis of colon with hemorrhage    April 2021, [facility]   Duodenal mass    Fibromyalgia    GERD with stricture    Helicobacter pylori (H. 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   Vitamin D  deficiency 11/29/2017    Past Surgical History:  Procedure Laterality Date   ADENOIDECTOMY     bladder distention     x 6   CARDIAC CATHETERIZATION     CATHETER REMOVAL     super pubic area   COLONOSCOPY     CORONARY STENT INTERVENTION N/A 05/29/2022   Procedure: CORONARY STENT INTERVENTION;  Surgeon: Jordan, Peter M, MD;  Location: [facility] INVASIVE CV LAB;  Service: Cardiovascular;  Laterality: N/A;   DENTAL SURGERY Right    #30 tooth extraction (right upper)   ESOPHAGOGASTRODUODENOSCOPY  12/17/2022   INTERSTIM IMPLANT PLACEMENT     INTERSTIM IMPLANT REMOVAL     KNEE ARTHROSCOPY     right x 2   LEFT HEART CATH AND CORONARY ANGIOGRAPHY N/A 05/29/2022    Procedure: LEFT HEART CATH AND CORONARY ANGIOGRAPHY;  Surgeon: Jordan, Peter M, MD;  Location: [facility] INVASIVE CV LAB;  Service: Cardiovascular;  Laterality: N/A;   NISSEN FUNDOPLICATION  2009   PROSTECTOMY     ROBOT ASSISTED LAPAROSCOPIC COMPLETE CYSTECT ILEAL CONDUIT     SHOULDER ARTHROSCOPY  2010   left   TONSILLECTOMY AND ADENOIDECTOMY  1957   TOTAL KNEE ARTHROPLASTY Right 09/08/2021   Procedure: TOTAL KNEE ARTHROPLASTY;  Surgeon: Duwayne Purchase, MD;  Location: [facility] ORS;  Service: Orthopedics;  Laterality: Right;   TRANSURETHRAL RESECTION OF PROSTATE     x 2   UPPER GASTROINTESTINAL ENDOSCOPY  05/13/2008   hiatal hernia   VASECTOMY     PCP: [facility] Dx: CAD, GERD, CKD3a, h/o IDA, interstitial cystitis Surgeries: cystectomy and ileal conduit for end stage interstitial cystitis, Nissen fundoplication  Trauma: denies head trauma Seizures: denies   Family Psychiatric History:  Denies any family psychiatric history  Family History:  Family History  Problem Relation Age of Onset   Allergic rhinitis Mother    Heart disease Mother    Hypertension Mother    Stroke Mother    Arthritis Mother    Colon polyps Father    Diabetes Father    Heart disease Father    Hypertension Father    Diabetes Sister    Stroke Sister    Breast cancer Sister    Liver cancer Maternal Grandfather    Diabetes Paternal Uncle    Liver cancer Paternal Uncle    Colon cancer Neg Hx    Esophageal cancer Neg Hx     Social History:   Academic/Vocational: got GED, denies college Housing: live with wife Income: reports disabled since 2000 for interstitial cystitis, runs a business - an buyer, 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, 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   Support: consider wife and church to be support system Children: 4 (grown) Marital Status: been married for 32 years   Social History   Socioeconomic History   Marital status: Married    Spouse name: Phillis   Number of children: 4   Years of education: Not on file    Highest education level: Not on file  Occupational History   Occupation: retired  Tobacco Use   Smoking status: Former    Passive exposure: Past   Smokeless tobacco: Never   Tobacco comments:    quit 1980  Vaping Use   Vaping status: Never Used  Substance and Sexual Activity   Alcohol  use: No    Alcohol /week: 0.0 standard drinks of alcohol    Drug use: No   Sexual activity: Yes    Partners: Female  Other Topics Concern   Not on file  Social History Narrative   Married, retired for children   He is a former smoker no alcohol  or substance use      Lives with wife   Social Drivers of Corporate Investment Banker Strain: Low Risk  (01/07/2024)   Overall Financial Resource Strain (CARDIA)    Difficulty of Paying Living Expenses: Not very hard  Food Insecurity: Low Risk  (03/26/2024)   Received from [facility]   Hunger Vital Sign    Within the past 12 months, you worried that your food would run out before you got money to buy more: Never true    Within the past 12 months, the food you bought just didn't last and you didn't have money to get more. : Never true  Transportation Needs: No Transportation Needs (03/26/2024)   Received from [facility]    In the past 12 months, has lack of reliable transportation kept you from medical appointments, meetings, work or from getting things needed for daily living? : No  Recent Concern: Transportation Needs - Unmet Transportation Needs (03/25/2024)   Received from [facility]    In the past 12 months, has lack of reliable transportation kept you from medical appointments, meetings, work or from getting things needed for daily living? : Yes  Physical Activity: Insufficiently Active (01/07/2024)   Exercise Vital Sign    Days of Exercise per Week: 2 days    Minutes of Exercise per Session: 10 min  Stress: Stress Concern Present (01/07/2024)   [employer] of Occupational Health - Occupational Stress  Questionnaire    Feeling of Stress : To some extent  Social Connections: Moderately Integrated (01/07/2024)   Social Connection and  Isolation Panel    Frequency of Communication with Friends and Family: More than three times a week    Frequency of Social Gatherings with Friends and Family: More than three times a week    Attends Religious Services: More than 4 times per year    Active Member of [employer] or Organizations: No    Attends Banker Meetings: Never    Marital Status: Married    Additional Social History: updated  Allergies:   Allergies  Allergen Reactions   Ambien  [Zolpidem ]     Unknown reaction   Bentyl  [Dicyclomine ]     Memory loss, anxiety, blurred vision   Carafate  [Sucralfate ] Nausea And Vomiting   Lansoprazole  Diarrhea   Lexapro  [Escitalopram  Oxalate]     Unknown reaction    Other     Pt states he is sensitive to all oral medications   Pneumovax [Pneumococcal Polysaccharide Vaccine] Other (See Comments)    pain   Prilosec [Omeprazole ] Other (See Comments)    Per patient joint pain with PPI's   Remeron  [Mirtazapine ]     Unknown reaction    Seroquel  [Quetiapine ] Other (See Comments)    Unknown reaction    Statins Other (See Comments)    Joint pain   Tizanidine  Other (See Comments)    Made patient feel crazy   Tramadol  Other (See Comments)    Pt can take small amounts up to twice daily but continued use causes chest / abdominal pain   Tylenol  [Acetaminophen ] Nausea And Vomiting    Can only take for 2 takes then he starts to have pain   Pantoprazole  Rash    Current Medications: Current Outpatient Medications  Medication Sig Dispense Refill   venlafaxine XR (EFFEXOR-XR) 37.5 MG 24 hr capsule Take 1 capsule (37.5 mg total) by mouth daily with breakfast. 30 capsule 0   acetaminophen  (TYLENOL ) 500 MG tablet Take 500 mg by mouth every 6 (six) hours as needed.     bismuth subsalicylate (PEPTO BISMOL) 262 MG/15ML suspension Take 30 mLs by mouth every  6 (six) hours as needed.     hydrocortisone  (ANUSOL -HC) 2.5 % rectal cream Place 1 Application rectally 2 (two) times daily. (Patient not taking: Reported on 08/28/2024) 30 g 1   hydrOXYzine  (ATARAX ) 25 MG tablet 1-2 tabs by mouth as needed for itching or sleep 40 tablet 2   ketoconazole  (NIZORAL ) 2 % cream Apply 1 Application topically daily. To corners of the mouth (Patient not taking: Reported on 08/28/2024) 60 g 1   LORazepam  (ATIVAN ) 0.5 MG tablet Take 1 tablet (0.5 mg total) by mouth 2 (two) times daily as needed for anxiety. 60 tablet 5   Menthol , Topical Analgesic, (BIOFREEZE EX) Apply topically.     nitroGLYCERIN  (NITROSTAT ) 0.4 MG SL tablet Place 1 tablet (0.4 mg total) under the tongue every 5 (five) minutes as needed for chest pain. 90 tablet 3   pantoprazole  (PROTONIX ) 40 MG tablet Take 1 tablet (40 mg total) by mouth daily as needed. (Patient not taking: Reported on 08/28/2024) 90 tablet 3   traMADol  (ULTRAM ) 50 MG tablet Take 1 tablet (50 mg total) by mouth every 6 (six) hours as needed. 120 tablet 1   No current facility-administered medications for this visit.    ROS: Review of Systems Respiratory:  Negative for shortness of breath.   Cardiovascular:  Negative for chest pain.  Gastrointestinal:  Positive for nausea, negative for  constipation, diarrhea, vomiting.  Neurological:  Negative for headaches.   Objective:  Psychiatric  Specialty Exam: There were no vitals taken for this visit.There is no height or weight on file to calculate BMI.  General Appearance: Casual  Eye Contact:  Fair  Speech:  Clear and Coherent  Volume:  Normal  Mood:  Depressed  Affect:  Blunt  Thought Content: Rumination   Suicidal Thoughts:  No  Homicidal Thoughts:  No  Thought Process:  Coherent  Orientation:  Full (Time, Place, and Person)    Memory:  Grossly intact   Judgment:  Intact  Insight:  Shallow  Concentration:  Concentration: Fair  Recall:  not formally assessed   Fund of  Knowledge: Fair  Language: Fair  Psychomotor Activity:  Normal  Akathisia:  No  AIMS (if indicated): not done  Assets:  Communication Skills Desire for Improvement Financial Resources/Insurance Housing Intimacy Leisure Time Resilience Social Support Transportation  ADL's:  Intact  Cognition: WNL  Sleep:  Poor   PE: General: well-appearing; no acute distress  Pulm: no increased work of breathing on room air  Strength & Muscle Tone: within normal limits Neuro: no focal neurological deficits observed  Gait & Station: normal  Metabolic Disorder Labs: Lab Results  Component Value Date   HGBA1C 6.0 10/09/2023   MPG 126 01/10/2023   MPG 122.63 09/10/2021   No results found for: PROLACTIN Lab Results  Component Value Date   CHOL 222 (H) 10/09/2023   TRIG 287.0 (H) 10/09/2023   HDL 38.10 (L) 10/09/2023   CHOLHDL 6 10/09/2023   VLDL 57.4 (H) 10/09/2023   LDLCALC 126 (H) 10/09/2023   LDLCALC 94 11/28/2022   Lab Results  Component Value Date   TSH 3.30 10/09/2023    Therapeutic Level Labs: No results found for: LITHIUM No results found for: CBMZ No results found for: VALPROATE  Screenings:  GAD-7    Flowsheet Row Office Visit from 04/02/2017 in [facility]  Total GAD-7 Score 2   PHQ2-9    Flowsheet Row Office Visit from 08/24/2024 in [facility] [place] Office Visit from 07/06/2024 in [facility] [place] Office Visit from 03/05/2024 in [facility] [place] Office Visit from 01/23/2024 in [facility] Clinical Support from 01/07/2024 in [facility]  PHQ-2 Total Score 0 4 0 3 3  PHQ-9 Total Score 0 16 0 8 8   Flowsheet Row Office Visit from 07/06/2024 in [facility] [place] ED from 04/16/2022 in [facility] Emergency Department at [facility] Admission  (Discharged) from 09/08/2021 in [place] [facility]  C-SSRS RISK CATEGORY Error: Q3, 4, or 5 should not be populated when Q2 is No No Risk No Risk    Collaboration of Care: Collaboration of Care: Psychiatrist AEB attending MD  Patient/Guardian was advised Release of Information must be obtained prior to any record release in order to collaborate their care with an outside provider. Patient/Guardian was advised if they have not already done so to contact the registration department to sign all necessary forms in order for us  to release information regarding their care.   Consent: Patient/Guardian gives verbal consent for treatment and assignment of benefits for services provided during this visit. Patient/Guardian expressed understanding and agreed to proceed.   Jeremiah Tarpley, MD, PGY-3 11/3/20253:35 PM

## 2024-09-14 ENCOUNTER — Ambulatory Visit (HOSPITAL_COMMUNITY): Admitting: Psychiatry

## 2024-09-14 DIAGNOSIS — F341 Dysthymic disorder: Secondary | ICD-10-CM | POA: Diagnosis not present

## 2024-09-14 DIAGNOSIS — F411 Generalized anxiety disorder: Secondary | ICD-10-CM

## 2024-09-14 MED ORDER — VENLAFAXINE HCL ER 37.5 MG PO CP24: 37.5000 mg | ORAL_CAPSULE | Freq: Every day | ORAL | 0 refills | Status: DC

## 2024-09-16 ENCOUNTER — Ambulatory Visit (HOSPITAL_BASED_OUTPATIENT_CLINIC_OR_DEPARTMENT_OTHER): Payer: Self-pay

## 2024-09-16 ENCOUNTER — Telehealth (HOSPITAL_COMMUNITY): Payer: Self-pay | Admitting: *Deleted

## 2024-09-16 NOTE — Telephone Encounter (Signed)
 Received VM from outside provider, unfortunately was not able to hear the name of company, and no phone number was left. This is in regards to pt not starting the Effexor r/t concerns regarding possible GI bleeding risk. Also pt expressed concerns that he has not been sleeping since coming off the Lorazepam .    Last visit: 09/14/24 Next visit: 10/12/24

## 2024-09-22 ENCOUNTER — Ambulatory Visit: Attending: Physician Assistant

## 2024-09-22 DIAGNOSIS — R0602 Shortness of breath: Secondary | ICD-10-CM | POA: Diagnosis not present

## 2024-09-22 LAB — ECHOCARDIOGRAM COMPLETE
AR max vel: 2.95 cm2
AV Area VTI: 3.08 cm2
AV Area mean vel: 2.8 cm2
AV Mean grad: 2 mmHg
AV Peak grad: 4 mmHg
AV Vena cont: 0.6 cm
Ao pk vel: 1 m/s
Area-P 1/2: 2.08 cm2
MV VTI: 1.69 cm2
P 1/2 time: 370 ms
S' Lateral: 2.5 cm

## 2024-09-23 ENCOUNTER — Telehealth (HOSPITAL_COMMUNITY): Payer: Self-pay

## 2024-09-23 NOTE — Telephone Encounter (Signed)
 I received a call from Tonya with [facility] - she stated that the patient is not wanting to take the Effexor due to side effects. I called patient. Patient states he has bleeding issues and is scared to take it. He also said he is not sleeping at all and he is feeling worse than before. He would like a call back please, thank you

## 2024-09-25 NOTE — Progress Notes (Signed)
 I have called [name]. Effexor can sometimes increase palpitation and QT interval, chart reviewed, Mr. Lieurance QT interval has always been normal.  Although he has prior history of SVT, however his SVT episode quite transient and the longest lasted only 6 seconds on the previous heart monitor in 2024.  Therefore I think would be reasonable for him to try the Effexor.

## 2024-09-29 ENCOUNTER — Ambulatory Visit (HOSPITAL_COMMUNITY): Admitting: Licensed Clinical Social Worker

## 2024-09-29 DIAGNOSIS — F450 Somatization disorder: Secondary | ICD-10-CM

## 2024-09-29 NOTE — Progress Notes (Signed)
 THERAPIST PROGRESS NOTE  Session Time: 1 p.m. to 1:53 p.m.   Virtual Visit via Video Note   I connected with [name] at  1 p.m. EST by a video enabled telemedicine application and verified that I am speaking with the correct person using two identifiers.   Location: Patient: home Provider: [facility] office   I discussed the limitations of evaluation and management by telemedicine and the availability of in person appointments. The patient expressed understanding and agreed to proceed.  Type of Therapy: Individual   Therapist Response/Interventions: Other: the therapist obtains information regarding his presenting problem and makes him aware of GeneSight testing while discussing the health benefits of wearing a C-Pap  Treatment Goals addressed: N/A  Summary: This is this therapist's initial meeting with [name] who was referred to him by Dr. Chien. [name] was initially scheduled for in person therapy; however, he called today a couple of hours before the appointment changing it to tele-video. Consequently, this therapist has no new patient paperwork and nor can he have [name] review and sign his Professional Disclosure Statement. [name] indicates that this therapist can email it to him; however, he is unable to open it and review it during this session as he says that he is not tech saavy.   [name] says that his biggest problem is that he is sleep deprived as a result of experiencing pain from post-trauma surgery. He says that he went to pain management in the past and it ws conclude that he needed to see a psychologist and a psychiatrist. He met with Dr. Chien but has not taken the Effexor  that she prescribed saying that she does not have all the information that was needed and that he read that Effexor  was flagged for bruising and bleeding.   He was on Gabapentin  for pain noting that he was fairly stable but that he had to stop it as it ran up his blood pressure and heart rate. He reports that he  can only take his Protonix  for about three days before it causes diarrhea. He can take his Ultram  for four or five days but then experiences chest pain. Dr. Chien cautioned him against using Lorazepam  but after three days of Atarax , he resumed the Lorazepam .   [name] saw a therapist, Dr. Rudolph Hasten for about a year and Ms. Victoria Winstead for six weeks. He says that Dr. Hasten was helpful in that she wrote up a report indicating that his problem was neurological and not psychological; however, he says that Ms. Winstead was not helpful. [name] says that his Urologist has told him that he has the most medication sensitivities of anyone in the world.   When asked how he got this therapy appointment, he says that Dr. Chien encouraged it saying that he needed someone who could support his situation. [name] says that he attends a [address] and that his Tommi, including his Juliene, are his support in addiction to his wife of 32 years. [name] also has four sons. 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   His wife also believes that [name] is not depressed. He says that he has had two fairly good days on Tramadol  so rates his depression and anxiety both as a 1 or 2 on a scale of 1-10 with 10 being most severe.  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 [name] says that medications that are supposed to take a while to work take effect instantly with him such that he believes he may be over metabolizing them.   [name] is not satisfied with the PCP who referred him to Dr. Chien as he will not  accept that [name] is not depressed so [name] is changing doctors on 11/02/24 going to a new practice.   By the conclusion of this meeting, he validates that he has no therapy issues to address at this time. He does not believe he needs an anti-depressant for his mood but is open to possibly taking one to alleviate symptoms of his interstitial cystitis but would likely first want the results from the Endocrinologist as well as GeneSight testing which the therapist introduces him to today.  Progress Towards Goals: Initial  Suicidal/Homicidal: No SI or HI  Plan: he will see Dr. Graham again on 10/13/24 and discuss GeneSight testing. He will call to schedule with this therapist again if he determines that he has a want or need to be seen for therapy.   Diagnosis: Somatization Disorder per history  Collaboration of Care: Other N/A  Patient/Guardian was advised Release of Information must be obtained prior to any record release in order to collaborate their care with an outside provider. Patient/Guardian was advised if they have not already done so to contact the registration department to sign all necessary forms in order for us  to release information regarding their care.   Consent: Patient/Guardian gives verbal consent for treatment and assignment of benefits for services provided during this visit. Patient/Guardian expressed understanding and agreed to proceed.   [name] Maier, MA, LCSW, [facility], LCAS 09/29/2024

## 2024-09-30 ENCOUNTER — Ambulatory Visit (INDEPENDENT_AMBULATORY_CARE_PROVIDER_SITE_OTHER)

## 2024-09-30 ENCOUNTER — Encounter: Payer: Self-pay | Admitting: Internal Medicine

## 2024-09-30 ENCOUNTER — Ambulatory Visit: Admitting: Internal Medicine

## 2024-09-30 ENCOUNTER — Telehealth (INDEPENDENT_AMBULATORY_CARE_PROVIDER_SITE_OTHER): Admitting: Internal Medicine

## 2024-09-30 ENCOUNTER — Encounter (HOSPITAL_BASED_OUTPATIENT_CLINIC_OR_DEPARTMENT_OTHER): Payer: Self-pay

## 2024-09-30 VITALS — BP 148/77 | HR 83 | Ht 67.0 in | Wt 173.0 lb

## 2024-09-30 DIAGNOSIS — E559 Vitamin D deficiency, unspecified: Secondary | ICD-10-CM | POA: Diagnosis not present

## 2024-09-30 DIAGNOSIS — J0510 Acute epiglottitis without obstruction: Secondary | ICD-10-CM | POA: Diagnosis not present

## 2024-09-30 DIAGNOSIS — J309 Allergic rhinitis, unspecified: Secondary | ICD-10-CM | POA: Diagnosis not present

## 2024-09-30 DIAGNOSIS — K222 Esophageal obstruction: Secondary | ICD-10-CM

## 2024-09-30 DIAGNOSIS — K219 Gastro-esophageal reflux disease without esophagitis: Secondary | ICD-10-CM

## 2024-09-30 DIAGNOSIS — J069 Acute upper respiratory infection, unspecified: Secondary | ICD-10-CM | POA: Insufficient documentation

## 2024-09-30 DIAGNOSIS — G4733 Obstructive sleep apnea (adult) (pediatric): Secondary | ICD-10-CM

## 2024-09-30 MED ORDER — METHYLPREDNISOLONE 4 MG PO TBPK: ORAL_TABLET | ORAL | 0 refills | Status: AC

## 2024-09-30 MED ORDER — AZITHROMYCIN 250 MG PO TABS: ORAL_TABLET | ORAL | 1 refills | Status: AC

## 2024-09-30 NOTE — Assessment & Plan Note (Signed)
 Currently stable without worsening dysphagia,  to f/u any worsening symptoms or concerns

## 2024-09-30 NOTE — Assessment & Plan Note (Signed)
 Mild to mod, for medrol  pack x 1, and start otc allegra 180 every day and nasacort  asd,  to f/u any worsening symptoms or concerns

## 2024-09-30 NOTE — Patient Instructions (Addendum)
 Call office for follow up or referral to orthodontist as needed.  See attached information on living with sleep apnea.

## 2024-09-30 NOTE — Assessment & Plan Note (Signed)
Mild to mod, for antibx course,  to f/u any worsening symptoms or concerns 

## 2024-09-30 NOTE — Patient Instructions (Signed)
 Please take all new medication as prescribed

## 2024-09-30 NOTE — Progress Notes (Signed)
 Patient ID: Erik LITTIE Lanius Sr., male   DOB: October 04, 1946, 78 y.o.   MRN: [id number]  Virtual Visit via Video Note  I connected with Erik LITTIE Lanius Sr. on 09/30/24 at  9:40 AM EST by a video enabled telemedicine application and verified that I am speaking with the correct person using two identifiers.  Location of all participants today Patient: at home Provider: at office   I discussed the limitations of evaluation and management by telemedicine and the availability of in person appointments. The patient expressed understanding and agreed to proceed.  History of Present Illness:  Here with 2-3 days acute onset fever, facial pain, pressure, headache, general weakness and malaise, and greenish d/c, with mild ST with a constricting feeling, and non prod cough, but pt denies chest pain, wheezing, increased sob or doe, orthopnea, PND, increased LE swelling, palpitations, dizziness or syncope.   Pt denies polydipsia, polyuria, or new focal neuro s/s.  Does have several wks ongoing nasal allergy  symptoms with clearish congestion, itch and sneezing, without fever.  Denies worsening reflux, abd pain, dysphagia, n/v, bowel change or blood. 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 pylori) infection 11/25/2012   11/2012 EGD + gastric bxs   Hiatal hernia    HLD (hyperlipidemia)    HTN (hypertension)    Hyperlipidemia    IBS (irritable bowel syndrome)    Impotence of  organic origin    Insomnia    Internal and external hemorrhoids without complication    Interstitial cystitis    Irritable bowel syndrome 06/09/2010   Qualifier: Diagnosis of  By: Avram MD, NOLIA Pitts E    Kidney stone    Osteoporosis    Pelvic floor dysfunction 11/29/2017   Pneumonia    Reactive hypoglycemia    Rheumatoid arthritis(714.0)    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   Vitamin D  deficiency 11/29/2017   Past Surgical History:  Procedure Laterality Date   ADENOIDECTOMY     bladder distention     x 6   CARDIAC CATHETERIZATION     CATHETER REMOVAL     super pubic area   COLONOSCOPY     CORONARY STENT INTERVENTION N/A 05/29/2022   Procedure: CORONARY STENT INTERVENTION;  Surgeon: Jordan, Peter M, MD;  Location: [facility] INVASIVE CV LAB;  Service: Cardiovascular;  Laterality: N/A;   DENTAL SURGERY Right    #30 tooth extraction (right upper)   ESOPHAGOGASTRODUODENOSCOPY  12/17/2022   INTERSTIM IMPLANT PLACEMENT     INTERSTIM IMPLANT REMOVAL     KNEE ARTHROSCOPY     right x 2   LEFT HEART CATH AND CORONARY ANGIOGRAPHY N/A 05/29/2022   Procedure: LEFT HEART CATH AND CORONARY ANGIOGRAPHY;  Surgeon: Jordan, Peter M, MD;  Location: [facility] INVASIVE CV LAB;  Service: Cardiovascular;  Laterality: N/A;   NISSEN FUNDOPLICATION  2009   PROSTECTOMY  ROBOT ASSISTED LAPAROSCOPIC COMPLETE CYSTECT ILEAL CONDUIT     SHOULDER ARTHROSCOPY  2010   left   TONSILLECTOMY AND ADENOIDECTOMY  1957   TOTAL KNEE ARTHROPLASTY Right 09/08/2021   Procedure: TOTAL KNEE ARTHROPLASTY;  Surgeon: Duwayne Purchase, MD;  Location: [facility];  Service: Orthopedics;  Laterality: Right;   TRANSURETHRAL RESECTION OF PROSTATE     x 2   UPPER GASTROINTESTINAL ENDOSCOPY  05/13/2008   hiatal hernia   VASECTOMY      reports that he has quit smoking. He has been exposed to tobacco smoke. He has never used smokeless tobacco. He reports that he does not drink alcohol  and does not use drugs. family history includes Allergic  rhinitis in his mother; Arthritis in his mother; Breast cancer in his sister; Colon polyps in his father; Diabetes in his father, paternal uncle, and sister; Heart disease in his father and mother; Hypertension in his father and mother; Liver cancer in his maternal grandfather and paternal uncle; Stroke in his mother and sister. Allergies  Allergen Reactions   Ambien  [Zolpidem ]     Unknown reaction   Bentyl  [Dicyclomine ]     Memory loss, anxiety, blurred vision   Carafate  [Sucralfate ] Nausea And Vomiting   Lansoprazole  Diarrhea   Lexapro  [Escitalopram  Oxalate]     Unknown reaction    Other     Pt states he is sensitive to all oral medications   Pneumovax [Pneumococcal Polysaccharide Vaccine] Other (See Comments)    pain   Prilosec [Omeprazole ] Other (See Comments)    Per patient joint pain with PPI's   Remeron  [Mirtazapine ]     Unknown reaction    Seroquel  [Quetiapine ] Other (See Comments)    Unknown reaction    Statins Other (See Comments)    Joint pain   Tizanidine  Other (See Comments)    Made patient feel crazy   Tramadol  Other (See Comments)    Pt can take small amounts up to twice daily but continued use causes chest / abdominal pain   Tylenol  [Acetaminophen ] Nausea And Vomiting    Can only take for 2 takes then he starts to have pain   Pantoprazole  Rash   Current Outpatient Medications on File Prior to Visit  Medication Sig Dispense Refill   acetaminophen  (TYLENOL ) 500 MG tablet Take 500 mg by mouth every 6 (six) hours as needed.     bismuth subsalicylate (PEPTO BISMOL) 262 MG/15ML suspension Take 30 mLs by mouth every 6 (six) hours as needed.     hydrocortisone  (ANUSOL -HC) 2.5 % rectal cream Place 1 Application rectally 2 (two) times daily. (Patient not taking: Reported on 08/28/2024) 30 g 1   hydrOXYzine  (ATARAX ) 25 MG tablet 1-2 tabs by mouth as needed for itching or sleep 40 tablet 2   ketoconazole  (NIZORAL ) 2 % cream Apply 1 Application topically daily. To corners of  the mouth (Patient not taking: Reported on 08/28/2024) 60 g 1   LORazepam  (ATIVAN ) 0.5 MG tablet Take 1 tablet (0.5 mg total) by mouth 2 (two) times daily as needed for anxiety. 60 tablet 5   Menthol , Topical Analgesic, (BIOFREEZE EX) Apply topically.     nitroGLYCERIN  (NITROSTAT ) 0.4 MG SL tablet Place 1 tablet (0.4 mg total) under the tongue every 5 (five) minutes as needed for chest pain. 90 tablet 3   pantoprazole  (PROTONIX ) 40 MG tablet Take 1 tablet (40 mg total) by mouth daily as needed. (Patient not taking: Reported on 08/28/2024) 90 tablet 3   traMADol  (ULTRAM ) 50 MG tablet Take  1 tablet (50 mg total) by mouth every 6 (six) hours as needed. 120 tablet 1   venlafaxine XR (EFFEXOR-XR) 37.5 MG 24 hr capsule Take 1 capsule (37.5 mg total) by mouth daily with breakfast. 30 capsule 0   No current facility-administered medications on file prior to visit.    Observations/Objective: Alert, mild ill, appropriate mood and affect, resps normal, cn 2-12 intact, moves all 4s, no visible rash or swelling Lab Results  Component Value Date   WBC 8.6 06/15/2024   HGB 15.7 06/15/2024   HCT 47.1 06/15/2024   PLT 272.0 06/15/2024   GLUCOSE 111 (H) 06/15/2024   CHOL 222 (H) 10/09/2023   TRIG 287.0 (H) 10/09/2023   HDL 38.10 (L) 10/09/2023   LDLDIRECT 169.0 06/24/2018   LDLCALC 126 (H) 10/09/2023   ALT 17 06/15/2024   AST 17 06/15/2024   NA 137 06/15/2024   K 4.2 06/15/2024   CL 101 06/15/2024   CREATININE 1.22 06/15/2024   BUN 17 06/15/2024   CO2 28 06/15/2024   TSH 3.30 10/09/2023   PSA 0.00 (L) 11/28/2022   HGBA1C 6.0 10/09/2023   Assessment and Plan: See notes  Follow Up Instructions: See notes   I discussed the assessment and treatment plan with the patient. The patient was provided an opportunity to ask questions and all were answered. The patient agreed with the plan and demonstrated an understanding of the instructions.   The patient was advised to call back or seek an in-person  evaluation if the symptoms worsen or if the condition fails to improve as anticipated.   Lynwood Rush, MD

## 2024-09-30 NOTE — Assessment & Plan Note (Signed)
 Last vitamin D Lab Results  Component Value Date   VD25OH 32.16 11/28/2022   Low, to start oral replacement

## 2024-09-30 NOTE — Progress Notes (Signed)
 @Patient  ID: Erik LITTIE Lanius Sr., male    DOB: 09/10/1946, 78 y.o.   MRN: [id number]  Chief Complaint  Patient presents with   Follow-up    Sleep     Referring provider: Norleen Lynwood ORN, MD  HPI: Discussed the use of AI scribe software for clinical note transcription with the patient, who gave verbal consent to proceed.  History of Present Illness Erik LITTIE Lanius Sr. [name] is a 78 year old male who presents for review of his sleep study results.  He underwent a sleep study which revealed an apnea-hypopnea index of 13.5 events per hour. He has not been treated for this condition previously.  He sleeps on his side and uses a thick pillow, which may help elevate his head. He inquires about the impact of pillow thickness on his condition. He has not tried a CPAP mask but possesses a generic mouthpiece that he has not used.  He mentions a history of adrenal problems that he believes may interfere with his sleep.  He does c/o fatigue, but when discussing treatment options for OSA, he indicates that he has a 'complicated situation' and is unsure if he wants any other treatments.  Last OV 08/05/2024: [name] is a 78 y/o male with PMH of CAD s/p stent, allergic rhinitis, GERD, and depression who presents as a new patient for evaluation of sleep.  Patient reports that he has has sleep disturbances for several years most associated with medical issues such as interstitial cystitis.  He has had frequent wake ups at night and feels that his sleep is disturbed and therefore he has daytime fatigue.  After discussing this further and with his wife's help, it is noted that he does have snoring, daytime headaches, excessive daytime sleepiness, and at times startles awake with shortness of breath.  He reports that he goes to bed around 9:45 at night and stays in bed until 8 or 830 the next morning.  He only takes him about 20 to 30 minutes to fall asleep and he reports several episodes of  waking up in the night.  Those are sometimes a startle awake and sometimes they are for other reasons.  He reports a 10 pound increase in his weight in the last couple years and feels that this has worsened his symptoms.  He denies fever, chills, night sweats, weight loss, chest pain, productive cough, appetite changes.  He quit smoking in 1983 and quit drinking alcohol  in 1990.  He denies any drug use and is unable to tolerate caffeine.  He lives at home with his wife and is on disabled status.  He has never been evaluated for sleep apnea in the past.   TEST/EVENTS :  Cardiac cath 05/29/2022:  Single vessel obstructive CAD Normal LVEDP Successful PCI of the proximal to mid LAD with OCT guidance and DES x 1 HST:  mild/mod OSA with AHI 13.5/hr  Allergies  Allergen Reactions   Ambien  [Zolpidem ]     Unknown reaction   Bentyl  [Dicyclomine ]     Memory loss, anxiety, blurred vision   Carafate  [Sucralfate ] Nausea And Vomiting   Lansoprazole  Diarrhea   Lexapro  [Escitalopram  Oxalate]     Unknown reaction    Other     Pt states he is sensitive to all oral medications   Pneumovax [Pneumococcal Polysaccharide Vaccine] Other (See Comments)    pain   Prilosec [Omeprazole ] Other (See Comments)    Per patient joint pain with PPI's   Remeron  [  Mirtazapine ]     Unknown reaction    Seroquel  [Quetiapine ] Other (See Comments)    Unknown reaction    Statins Other (See Comments)    Joint pain   Tizanidine  Other (See Comments)    Made patient feel crazy   Tramadol  Other (See Comments)    Pt can take small amounts up to twice daily but continued use causes chest / abdominal pain   Tylenol  [Acetaminophen ] Nausea And Vomiting    Can only take for 2 takes then he starts to have pain   Pantoprazole  Rash    Immunization History  Administered Date(s) Administered   Moderna Sars-Covid-2 Vaccination 01/11/2020, 02/08/2020   Pneumococcal Polysaccharide-23 07/03/2013   Pneumococcal-Unspecified 11/12/2012    Tdap 08/23/2014    Past Medical History:  Diagnosis Date   Allergic rhinitis    Allergy     Anal fissure    Anemia    Anxiety    Blood transfusion without reported diagnosis 12/06/2022   also 2 in the past   Cataract    starting   Coronary atherosclerosis of native coronary artery 2007   nonobstructive CAD by cath with 40% mid-LAD stenosis   Degenerative arthritis of knee, bilateral 10/09/2017   Depression    Diverticulosis 12/18/2011   Diverticulosis of colon with hemorrhage    April 2021, [facility]   Duodenal mass    Fibromyalgia    GERD with stricture    Helicobacter pylori (H. 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     azithromycin  (ZITHROMAX ) 250 MG tablet Take 2 tablets on day 1, then 1 tablet daily on days 2 through 5 6 tablet 1   bismuth subsalicylate (PEPTO BISMOL) 262 MG/15ML suspension Take 30 mLs by mouth every 6 (six) hours as needed.     hydrocortisone  (ANUSOL -HC) 2.5 % rectal cream Place 1 Application rectally 2  (two) times daily. 30 g 1   hydrOXYzine  (ATARAX ) 25 MG tablet 1-2 tabs by mouth as needed for itching or sleep 40 tablet 2   ketoconazole  (NIZORAL ) 2 % cream Apply 1 Application topically daily. To corners of the mouth 60 g 1   LORazepam  (ATIVAN ) 0.5 MG tablet Take 1 tablet (0.5 mg total) by mouth 2 (two) times daily as needed for anxiety. 60 tablet 5   Menthol , Topical Analgesic, (BIOFREEZE EX) Apply topically.     methylPREDNISolone  (MEDROL  DOSEPAK) 4 MG TBPK tablet 4 tabs by mouth per  day x 3 days, 2 tabs x 3 days, 1 tab x 3 days 21 tablet 0   nitroGLYCERIN  (NITROSTAT ) 0.4 MG SL tablet Place 1 tablet (0.4 mg total) under the tongue every 5 (five) minutes as needed for chest pain. 90 tablet 3   pantoprazole  (PROTONIX ) 40 MG tablet Take 1 tablet (40 mg total) by mouth daily as needed. 90 tablet 3   traMADol  (ULTRAM ) 50 MG tablet Take 1 tablet (50 mg total) by mouth every 6 (six) hours as needed. 120 tablet 1   venlafaxine XR (EFFEXOR-XR) 37.5 MG 24 hr capsule Take 1 capsule (37.5 mg total) by mouth daily with breakfast. 30 capsule 0   No facility-administered medications prior to visit.     Review of Systems:   Constitutional:   No  weight loss, night sweats,  Fevers, chills, fatigue, or  lassitude.  HEENT:   No headaches,  Difficulty swallowing,  Tooth/dental problems, or  Sore throat,                No sneezing, itching, ear ache, nasal congestion, post nasal drip,   CV:  No chest pain,  Orthopnea, PND, swelling in lower extremities, anasarca, dizziness, palpitations, syncope.   GI  No heartburn, indigestion, abdominal pain, nausea, vomiting, diarrhea, change in bowel habits, loss of appetite, bloody stools.   Resp: No shortness of breath with exertion or at rest.  No excess mucus, no productive cough,  No non-productive cough,  No coughing up of blood.  No change in color of mucus.  No wheezing.  No chest wall deformity  Skin: no rash or lesions.  GU: no dysuria, change in color of  urine, no urgency or frequency.  No flank pain, no hematuria   MS:  No joint pain or swelling.  No decreased range of motion.  No back pain.    Physical Exam  BP (!) 148/77   Pulse 83   Ht 5' 7 (1.702 m)   Wt 173 lb (78.5 kg)   SpO2 95%   BMI 27.10 kg/m   GEN: A/Ox3; pleasant , NAD, well nourished    HEENT:  [place]/AT,  EACs-clear, TMs-wnl, NOSE-clear, THROAT-clear, no lesions, no postnasal drip or exudate noted.   NECK:  Supple w/ fair ROM; no JVD; normal carotid impulses w/o bruits; no thyromegaly or nodules palpated; no lymphadenopathy.    RESP  Clear  P & A; w/o, wheezes/ rales/ or rhonchi. no accessory muscle use, no dullness to percussion  CARD:  RRR, no m/r/g, no peripheral edema, pulses intact, no cyanosis or clubbing.  GI:   Soft & nt; nml bowel sounds; no organomegaly or masses detected.   Musco: Warm bil, no deformities or joint swelling noted.   Neuro: alert, no focal deficits noted.    Skin: Warm, no lesions or rashes    Lab Results:  CBC    Component Value Date/Time   WBC 8.6 06/15/2024 0840   RBC 5.21 06/15/2024 0840   HGB 15.7 06/15/2024 0840   HGB 16.3 01/09/2024 1319   HGB 14.5 05/18/2022 1023   HCT 47.1 06/15/2024 0840   HCT 43.2 05/18/2022 1023   PLT 272.0 06/15/2024 0840   PLT 269 01/09/2024 1319   PLT 326 05/18/2022 1023   MCV 90.3 06/15/2024 0840   MCV 89 05/18/2022 1023   MCH 30.5 01/09/2024 1319   MCHC 33.4 06/15/2024 0840   RDW 13.1 06/15/2024 0840   RDW 12.7 05/18/2022 1023   LYMPHSABS 3.0 06/15/2024 0840  MONOABS 0.8 06/15/2024 0840   EOSABS 0.1 06/15/2024 0840   BASOSABS 0.1 06/15/2024 0840    BMET    Component Value Date/Time   NA 137 06/15/2024 0840   NA 139 05/18/2022 1023   K 4.2 06/15/2024 0840   CL 101 06/15/2024 0840   CO2 28 06/15/2024 0840   GLUCOSE 111 (H) 06/15/2024 0840   BUN 17 06/15/2024 0840   BUN 22 05/18/2022 1023   CREATININE 1.22 06/15/2024 0840   CREATININE 1.43 (H) 07/11/2023 1610   CALCIUM 9.5  06/15/2024 0840   GFRNONAA 51 (L) 07/11/2023 1610   GFRAA 57 (L) 03/26/2017 1506    BNP No results found for: BNP  ProBNP No results found for: PROBNP  Imaging: ECHOCARDIOGRAM COMPLETE Result Date: 09/22/2024    ECHOCARDIOGRAM REPORT   Patient Name:   Erik KANAAN Sr. Date of Exam: 09/22/2024 Medical Rec #:  [id number]          Height:       67.0 in Accession #:    [id number]         Weight:       172.4 lb Date of Birth:  08-Mar-1946           BSA:          1.899 m Patient Age:    78 years           BP:           116/58 mmHg Patient Gender: M                  HR:           69 bpm. Exam Location:  [place] Procedure: 2D Echo, Cardiac Doppler, Color Doppler and Strain Analysis (Both            Spectral and Color Flow Doppler were utilized during procedure). Indications:    SOB (shortness of breath) [R06.02 (ICD-10-CM)]  History:        Patient has prior history of Echocardiogram examinations, most                 recent 05/21/2022. CAD and Previous Myocardial Infarction,                 Arrythmias:Bradycardia, Signs/Symptoms:Fatigue and                 Dizziness/Lightheadedness; Risk Factors:Dyslipidemia. Prior                 echo- 60-65% EF, mild AI.  Sonographer:    Saddie Chimes Referring Phys: [phone] HAO MENG  Sonographer Comments: No subcostal window. IMPRESSIONS  1. Left ventricular ejection fraction, by estimation, is 60 to 65%. The left ventricle has normal function. The left ventricle has no regional wall motion abnormalities. Left ventricular diastolic parameters are consistent with Grade I diastolic dysfunction (impaired relaxation). The average left ventricular global longitudinal strain is 19.9 %. The global longitudinal strain is normal.  2. Right ventricular systolic function is normal. The right ventricular size is normal. There is normal pulmonary artery systolic pressure.  3. The mitral valve is normal in structure. No evidence of mitral valve regurgitation. No evidence of mitral  stenosis.  4. The aortic valve is normal in structure. Aortic valve regurgitation is mild. No aortic stenosis is present.  5. The inferior vena cava is normal in size with greater than 50% respiratory variability, suggesting right atrial pressure of 3 mmHg. FINDINGS  Left Ventricle: Left ventricular ejection fraction, by  estimation, is 60 to 65%. The left ventricle has normal function. The left ventricle has no regional wall motion abnormalities. The average left ventricular global longitudinal strain is 19.9 %. Strain was performed and the global longitudinal strain is normal. The left ventricular internal cavity size was normal in size. There is no left ventricular hypertrophy. Left ventricular diastolic parameters are consistent with Grade I diastolic dysfunction (impaired relaxation). Right Ventricle: The right ventricular size is normal. No increase in right ventricular wall thickness. Right ventricular systolic function is normal. There is normal pulmonary artery systolic pressure. The tricuspid regurgitant velocity is 2.32 m/s, and  with an assumed right atrial pressure of 3 mmHg, the estimated right ventricular systolic pressure is 24.5 mmHg. Left Atrium: Left atrial size was normal in size. Right Atrium: Right atrial size was normal in size. Pericardium: There is no evidence of pericardial effusion. Mitral Valve: The mitral valve is normal in structure. No evidence of mitral valve regurgitation. No evidence of mitral valve stenosis. MV peak gradient, 3.0 mmHg. The mean mitral valve gradient is 1.0 mmHg. Tricuspid Valve: The tricuspid valve is normal in structure. Tricuspid valve regurgitation is not demonstrated. No evidence of tricuspid stenosis. Aortic Valve: The aortic valve is normal in structure. Aortic valve regurgitation is mild. Aortic regurgitation PHT measures 370 msec. No aortic stenosis is present. Aortic valve mean gradient measures 2.0 mmHg. Aortic valve peak gradient measures 4.0 mmHg. Aortic  valve area, by VTI measures 3.08 cm. Pulmonic Valve: The pulmonic valve was normal in structure. Pulmonic valve regurgitation is not visualized. No evidence of pulmonic stenosis. Aorta: The aortic root is normal in size and structure. Venous: The inferior vena cava is normal in size with greater than 50% respiratory variability, suggesting right atrial pressure of 3 mmHg. IAS/Shunts: No atrial level shunt detected by color flow Doppler.  LEFT VENTRICLE PLAX 2D LVIDd:         3.80 cm   Diastology LVIDs:         2.50 cm   LV e' medial:    8.05 cm/s LV PW:         1.00 cm   LV E/e' medial:  6.1 LV IVS:        0.90 cm   LV e' lateral:   7.18 cm/s LVOT diam:     2.20 cm   LV E/e' lateral: 6.9 LV SV:         62 LV SV Index:   33        2D Longitudinal Strain LVOT Area:     3.80 cm  2D Strain GLS Avg:     19.9 %  RIGHT VENTRICLE RV Basal diam:  3.30 cm RV Mid diam:    3.20 cm TAPSE (M-mode): 1.9 cm LEFT ATRIUM           Index LA diam:      3.40 cm 1.79 cm/m LA Vol (A4C): 34.3 ml 18.07 ml/m  AORTIC VALVE                    PULMONIC VALVE AV Area (Vmax):    2.95 cm     PV Vmax:       1.27 m/s AV Area (Vmean):   2.80 cm     PV Vmean:      78.400 cm/s AV Area (VTI):     3.08 cm     PV VTI:        0.198 m AV Vmax:  99.80 cm/s   PV Peak grad:  6.5 mmHg AV Vmean:          67.000 cm/s  PV Mean grad:  3.0 mmHg AV VTI:            0.202 m AV Peak Grad:      4.0 mmHg AV Mean Grad:      2.0 mmHg LVOT Vmax:         77.57 cm/s LVOT Vmean:        49.267 cm/s LVOT VTI:          0.164 m LVOT/AV VTI ratio: 0.81 AI PHT:            370 msec AR Vena Contracta: 0.60 cm  AORTA Ao Asc diam: 3.73 cm MITRAL VALVE               TRICUSPID VALVE MV Area (PHT): 2.08 cm    TR Peak grad:   21.5 mmHg MV Area VTI:   1.69 cm    TR Vmax:        232.00 cm/s MV Peak grad:  3.0 mmHg MV Mean grad:  1.0 mmHg    SHUNTS MV Vmax:       0.86 m/s    Systemic VTI:  0.16 m MV Vmean:      45.4 cm/s   Systemic Diam: 2.20 cm MV Decel Time: 364 msec MV E  velocity: 49.30 cm/s MV A velocity: 78.40 cm/s MV E/A ratio:  0.63 Jennifer Crape MD Electronically signed by Jennifer Crape MD Signature Date/Time: 09/22/2024/12:16:00 PM    Final    CT Angio Chest Pulmonary Embolism (PE) W or WO Contrast Result Date: 09/03/2024 CLINICAL DATA:  Shortness of breath, fatigue, dyspnea on exertion and elevated D-dimer. EXAM: CT ANGIOGRAPHY CHEST WITH CONTRAST TECHNIQUE: Multidetector CT imaging of the chest was performed using the standard protocol during bolus administration of intravenous contrast. Multiplanar CT image reconstructions and MIPs were obtained to evaluate the vascular anatomy. RADIATION DOSE REDUCTION: This exam was performed according to the departmental dose-optimization program which includes automated exposure control, adjustment of the mA and/or kV according to patient size and/or use of iterative reconstruction technique. CONTRAST:  OMNIPAQUE  IOHEXOL  350 MG/ML SOLN COMPARISON:  Cardiac PET-CT dated 08/29/2023 and cardiac/coronary CTA dated 05/16/2022. FINDINGS: Cardiovascular: Mildly enlarged heart. Normally opacified pulmonary arteries with no pulmonary arterial filling defects seen. Atheromatous calcifications, including the coronary arteries and aorta. Mediastinum/Nodes: No enlarged mediastinal, hilar, or axillary lymph nodes. Thyroid  gland, trachea, and esophagus demonstrate no significant findings. Small hiatal hernia. Lungs/Pleura: Small amount of bibasilar linear atelectasis/scarring. No pleural effusion or pneumothorax. Upper Abdomen: Simple appearing right renal cysts not needing imaging follow-up. Musculoskeletal: Thoracic and lower cervical spine degenerative changes. Review of the MIP images confirms the above findings. IMPRESSION: 1. No pulmonary emboli or acute abnormality. 2. Mild cardiomegaly. 3. Small hiatal hernia. 4. Calcific coronary artery and aortic atherosclerosis. Aortic Atherosclerosis (ICD10-I70.0). Electronically Signed   By:  Elspeth Bathe M.D.   On: 09/03/2024 12:48    Administration History     None           No data to display          No results found for: NITRICOXIDE   Assessment & Plan:   Assessment & Plan OSA (obstructive sleep apnea)  Assessment and Plan Assessment & Plan Obstructive sleep apnea Mild to moderate obstructive sleep apnea with an apnea-hypopnea index of 13.5 events per hour. - Discussed potential treatment options  including CPAP, sleep hygiene, and orthodontist referral for mouthpiece. - Emphasized sleep hygiene: consistent schedule, side sleeping, bed elevation, avoiding caffeine/alcohol , no TV in bed. - Advised weight management through diet and exercise. - Left follow-up open for treatment decision.  Patient does not wish to proceed with any interventions at this point. - Info regarding living with sleep apnea attached to AVS.    Return if symptoms worsen or fail to improve.  Candis Dandy, PA-C 09/30/2024

## 2024-10-04 ENCOUNTER — Encounter (HOSPITAL_BASED_OUTPATIENT_CLINIC_OR_DEPARTMENT_OTHER): Payer: Self-pay

## 2024-10-04 DIAGNOSIS — G4733 Obstructive sleep apnea (adult) (pediatric): Secondary | ICD-10-CM

## 2024-10-05 ENCOUNTER — Encounter: Payer: Self-pay | Admitting: Cardiovascular Disease

## 2024-10-05 ENCOUNTER — Telehealth: Payer: Self-pay | Admitting: Cardiovascular Disease

## 2024-10-05 NOTE — Telephone Encounter (Signed)
 Patient c/o Palpitations:  STAT if patient reporting lightheadedness, shortness of breath, or chest pain  How long have you had palpitations/irregular HR/ Afib? Are you having the symptoms now? Pt mychart message   Are you currently experiencing lightheadedness, SOB or CP? No   Do you have a history of afib (atrial fibrillation) or irregular heart rhythm? Yese   Have you checked your BP or HR? (document readings if available): no   Are you experiencing any other symptoms? Racing heart    Pt health care advocate calling to f/u on patients mychart message from today, 11/24. Health care advocate advised pt to go to ED, pt would like to be seen in office. Please advise.

## 2024-10-05 NOTE — Telephone Encounter (Signed)
 Called Tonya back, [employer]. No authorization to speak in depth, in regards to pt care. Bascom stated she had paperwork and would fax over, but wanted us  to be aware and call patient regarding palpitations.  Spoke with patient. Stated he has been having palpitations over the last few weeks and his heart gets excited over the littlest thing. Pt asymptomatic at this time. Last BP 140/68, HR 76 at 1000 this am. Denies any chest pain, shortness of breath, or change in activity, stress or anxiety. Pt states he has been staying hydrated and eating healthy. No lifestyle changes-per pt. Advised pt if chest pain occurs, shortness of breath or palpitations get worse or maintain to contact 911 or go to the ER. Will direct message to MD and nurse for any further recommendations.   [place], RN [facility] Triage

## 2024-10-05 NOTE — Telephone Encounter (Signed)
 Pt is asking about CPAP therapy, will need to order before I can tell him prices from DME can you please advise on order and settings

## 2024-10-05 NOTE — Telephone Encounter (Signed)
 Patient refused CPAP at his most recent appointment.  If he is agreeable, then I would send in an order for AutoPAP with 5 cm H2O min and 15 cm H2O max; follow up in 6-8 weeks for compliance download.

## 2024-10-07 NOTE — Progress Notes (Signed)
 Psychiatric Follow-up Adult Assessment  Patient Identification: Erik AZZARELLO Sr. MRN:  [id number] Date of Evaluation:  10/12/2024  Televisit via video: I connected with Erik LITTIE Lanius Sr. on 10/12/24 at  3:30 PM EST by a video enabled telemedicine application and verified that I am speaking with the correct person using two identifiers.  Location: Patient: home in [place] Provider: remote office in [place]   I discussed the limitations of evaluation and management by telemedicine and the availability of in person appointments. The patient expressed understanding and agreed to proceed.  I discussed the assessment and treatment plan with the patient. The patient was provided an opportunity to ask questions and all were answered. The patient agreed with the plan and demonstrated an understanding of the instructions.   The patient was advised to call back or seek an in-person evaluation if the symptoms worsen or if the condition fails to improve as anticipated.  Assessment:  Erik EAVES Sr. is a 78 y.o. male with a history of MDD, somatization disorder, 1 prior suicide attempt and hospitalization who presents in person to [facility] for initial evaluation of medications and mood.  Patient denies depressive symptoms in visit today and mainly reports irritability due to his pain. He reports concern with starting effexor  due to reading about increased bleeding risk. Discussed that this would be rare and that he could present to ED if he had issues with bleeding. We also discussed that he could discontinue the medication if he felt that he was having side effects given we are starting at a low dose. We discussed the risks of him being on multiple medications for sleep and discussed how a CPAP could help with his sleep. We discussed a trial of trazodone  for his sleep. The risks/benefits/side-effects/alternatives to this medication were discussed in detail with the patient and time was  given for questions. The patient consents to medication trial. Patient continues to have significant anxiety surrounding his pain symptoms and has limited insight into his ability to cope as well as desire for multiple medications for insomnia.   Risk Assessment: An assessment of suicide and violence risk factors was performed as part of this evaluation and is not significantly changed from the last visit.             While future psychiatric events cannot be accurately predicted, the patient does not currently require acute inpatient psychiatric care and does not currently meet [place]  involuntary commitment criteria.  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   Return to care in:  Future Appointments  Date Time Provider Department Center  10/28/2024  1:30 PM Janene Boer, [place] [facility]  11/30/2024  2:00 PM Graham Krabbe, MD [facility] None  01/07/2025 11:30 AM [facility]-ANNUAL WELLNESS VISIT 2 [facility]  01/11/2025  1:30 PM  [facility] None  01/11/2025  2:00 PM Iruku, Amber, MD [facility] None    Patient was given contact information for behavioral health clinic and was instructed to call 911 for emergencies.    Patient and plan of care will be discussed with the Attending MD, who agrees with the above statement and plan.   Subjective:  Chief Complaint: No chief complaint on file.  Interval History:   -saw [name], stated that he did not want therapy, stated that he took ativan  again and did not want to take effexor   -PDMP shows gabapentin  (08/2024), tramadol  (08/2024), and ativan  (08/2024) filled.  --asked about getting CPAP  Patient reports mood is average despite having a rough night, reports every other night will not sleep well. Reports he is in the process of getting a CPAP. Reports he does not feel depressed, is mainly dealing with pain. He reports previously seeing a pain clinic but they told him to see a counselor and psychiatrist. He denies feeling excessive depression, reports instead feeling challenged and ready to go. He reports that he takes comfort in going to heaven if he dies but not actively suicidal. Patient reports good appetite. Reports that he can only take medication for 4-5 days and reports that he is sensitive to medications. Explored with patient whether or not he wants to take medication for his mood and he reports that he still wants to try medication for his mood and pain though he is concerned regarding the bleeding risk of effexor . I discussed that this would be a rare side effect but he could go to the ED if he had issues and he reports he is scared to go to the ED due to feeling uncomfortable. He reports taking multiple medications for sleep including gabapentin  200mg , lorazepam  0.25mg , intermittently using hydroxyzine  and benadryl  as well as nighttime cold and flu. We discussed CPAP would likely help with sleep and we discussed trial of trazodone  to help with sleep. The  risks, benefits, side effects were discussed with the patient. Discussed risk of bleeding with trazodone  but he did not oppose this as much as he did with the effexor . We also discussed that if he has side effects to the effexor  that he is at a low dose and can discontinue. He reports sensitivity to medications and reports he will normally only take maintenance medicaitons for a couple of days before stopping.  Patient reports stressors include his pain. Patient reports nonadherence with medications. Patient reports no side effects. Patient reports no new substance use. Patient denies SI/HI/AVH.   Past Psychiatric History:  Diagnoses: MDD, GAD, somatization disorder Medication trials: remeron  15, celexa  10, elavil , valium , cymbalta  60, lexapro  10, hydroxyzine , seroquel , gabapentin , lyrica , ambien , lunesta    Reports that he is sensitive to medications  Remeron , took for 3 days then around a month: reports feeling crazy- felt closed in, chest pain, SOB, labile blood pressure, constipation.  Lexapro : felt crazy, felt like anything goes and out of control, don't recall time took it    Cymbalta : felt like a zombie, felt crazy  Celexa : does not remember    Hydroxyzine : feels sedated, take it intermittently   Lorazepam  (states 18 years), takes 0.25mg , reports is affecting his memory  Restarted gabapentin  6-7 days ago Previous psychiatrist/therapist: Richerd Ling - therapist, also saw [facility] Hasten psychologist previously, not currently seeing psychologist  Hospitalizations: reports in 1986 when wife left him for 3 weeks  Suicide attempts: yes, overdose on medications in 1986  SIB: denies  Hx of violence towards others: denies  Current access to guns: denies  Hx of trauma/abuse: denies   Substance Abuse History in the last 12 months:  No. Denies substance use   Past Medical History:  Past Medical History:  Diagnosis Date   Allergic rhinitis    Allergy     Anal fissure    Anemia     Anxiety    Blood transfusion without reported diagnosis 12/06/2022   also 2 in the past   Cataract    starting   Coronary atherosclerosis of native coronary artery 2007   nonobstructive CAD by cath with 40% mid-LAD stenosis   Degenerative arthritis of knee, bilateral 10/09/2017   Depression    Diverticulosis 12/18/2011   Diverticulosis of colon with hemorrhage    April [facility], [facility]   Duodenal mass    Fibromyalgia    GERD with stricture    Helicobacter pylori (H. 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   Vitamin D  deficiency 11/29/2017    Past Surgical History:  Procedure Laterality Date   ADENOIDECTOMY     bladder distention     x 6   CARDIAC CATHETERIZATION     CATHETER REMOVAL     super pubic area   COLONOSCOPY     CORONARY STENT INTERVENTION N/A 05/29/2022   Procedure: CORONARY STENT INTERVENTION;  Surgeon: Jordan, Peter M, MD;  Location: [facility] INVASIVE CV LAB;  Service: Cardiovascular;  Laterality: N/A;   DENTAL SURGERY Right    #30 tooth extraction (right upper)   ESOPHAGOGASTRODUODENOSCOPY  12/17/2022   INTERSTIM IMPLANT PLACEMENT     INTERSTIM IMPLANT REMOVAL     KNEE ARTHROSCOPY     right x 2   LEFT HEART CATH AND CORONARY ANGIOGRAPHY N/A 05/29/2022   Procedure: LEFT HEART CATH AND CORONARY ANGIOGRAPHY;  Surgeon: Jordan, Peter M, MD;  Location: [facility] INVASIVE CV LAB;  Service: Cardiovascular;  Laterality: N/A;   NISSEN FUNDOPLICATION   2009   PROSTECTOMY     ROBOT ASSISTED LAPAROSCOPIC COMPLETE CYSTECT ILEAL CONDUIT     SHOULDER ARTHROSCOPY  2010   left   TONSILLECTOMY AND ADENOIDECTOMY  1957   TOTAL KNEE ARTHROPLASTY Right 09/08/2021   Procedure: TOTAL KNEE ARTHROPLASTY;  Surgeon: Duwayne Purchase, MD;  Location: [facility] ORS;  Service: Orthopedics;  Laterality: Right;   TRANSURETHRAL RESECTION OF PROSTATE     x 2   UPPER GASTROINTESTINAL ENDOSCOPY  05/13/2008   hiatal hernia   VASECTOMY     PCP: [facility] Dx: CAD, GERD, CKD3a, h/o IDA, interstitial cystitis Surgeries: cystectomy and ileal conduit for end stage interstitial  cystitis, Nissen fundoplication  Trauma: denies head trauma Seizures: denies   Family Psychiatric History:  Denies any family psychiatric history  Family History:  Family History  Problem Relation Age of Onset   Allergic rhinitis Mother    Heart disease Mother    Hypertension Mother    Stroke Mother    Arthritis Mother    Colon polyps Father    Diabetes Father    Heart disease Father    Hypertension Father    Diabetes Sister    Stroke Sister    Breast cancer Sister    Liver cancer Maternal Grandfather    Diabetes Paternal Uncle    Liver cancer Paternal Uncle    Colon cancer Neg Hx    Esophageal cancer Neg Hx     Social History:   Academic/Vocational: got GED, denies college Housing: live with wife Income: reports disabled since 2000 for interstitial cystitis, runs a business - an buyer, 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, 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   Support: consider wife and church to be support system Children: 4 (grown) Marital Status: been married for 32 years   Social History   Socioeconomic History   Marital status: Married    Spouse name: Phillis   Number of children: 4   Years of education: Not on file   Highest education level: Not on file  Occupational History   Occupation: retired  Tobacco Use   Smoking status: Former    Passive exposure: Past   Smokeless tobacco: Never    Tobacco comments:    quit 1980  Vaping Use   Vaping status: Never Used  Substance and Sexual Activity   Alcohol  use: No    Alcohol /week: 0.0 standard drinks of alcohol    Drug use: No   Sexual activity: Yes    Partners: Female  Other Topics Concern   Not on file  Social History Narrative   Married, retired for children   He is a former smoker no alcohol  or substance use      Lives with wife   Social Drivers of Corporate Investment Banker Strain: Low Risk  (01/07/2024)   Overall Financial Resource Strain (CARDIA)    Difficulty of Paying Living Expenses: Not very hard  Food Insecurity: Low Risk  (03/26/2024)   Received from [facility]   Hunger Vital Sign    Within the past 12 months, you worried that your food would run out before you got money to buy more: Never true    Within the past 12 months, the food you bought just didn't last and you didn't have money to get more. : Never true  Transportation Needs: No Transportation Needs (03/26/2024)   Received from [facility]    In the past 12 months, has lack of reliable transportation kept you from medical appointments, meetings, work or from getting things needed for daily living? : No  Recent Concern: Transportation Needs - Unmet Transportation Needs (03/25/2024)   Received from [facility]    In the past 12 months, has lack of reliable transportation kept you from medical appointments, meetings, work or from getting things needed for daily living? : Yes  Physical Activity: Insufficiently Active (01/07/2024)   Exercise Vital Sign    Days of Exercise per Week: 2 days    Minutes of Exercise per Session: 10 min  Stress: Stress Concern Present (01/07/2024)   [employer] of Occupational Health - Occupational Stress Questionnaire    Feeling of Stress :  To some extent  Social Connections: Moderately Integrated (01/07/2024)   Social Connection and Isolation Panel    Frequency of Communication  with Friends and Family: More than three times a week    Frequency of Social Gatherings with Friends and Family: More than three times a week    Attends Religious Services: More than 4 times per year    Active Member of [employer] or Organizations: No    Attends Banker Meetings: Never    Marital Status: Married    Additional Social History: updated  Allergies:   Allergies  Allergen Reactions   Ambien  [Zolpidem ]     Unknown reaction   Bentyl  [Dicyclomine ]     Memory loss, anxiety, blurred vision   Carafate  [Sucralfate ] Nausea And Vomiting   Lansoprazole  Diarrhea   Lexapro  [Escitalopram  Oxalate]     Unknown reaction    Other     Pt states he is sensitive to all oral medications   Pneumovax [Pneumococcal Polysaccharide Vaccine] Other (See Comments)    pain   Prilosec [Omeprazole ] Other (See Comments)    Per patient joint pain with PPI's   Remeron  [Mirtazapine ]     Unknown reaction    Seroquel  [Quetiapine ] Other (See Comments)    Unknown reaction    Statins Other (See Comments)    Joint pain   Tizanidine  Other (See Comments)    Made patient feel crazy   Tramadol  Other (See Comments)    Pt can take small amounts up to twice daily but continued use causes chest / abdominal pain   Tylenol  [Acetaminophen ] Nausea And Vomiting    Can only take for 2 takes then he starts to have pain   Pantoprazole  Rash    Current Medications: Current Outpatient Medications  Medication Sig Dispense Refill   traZODone  (DESYREL ) 50 MG tablet Take 0.5-1 tablets (25-50 mg total) by mouth at bedtime. 30 tablet 1   acetaminophen  (TYLENOL ) 500 MG tablet Take 500 mg by mouth every 6 (six) hours as needed.     bismuth subsalicylate (PEPTO BISMOL) 262 MG/15ML suspension Take 30 mLs by mouth every 6 (six) hours as needed.     hydrocortisone  (ANUSOL -HC) 2.5 % rectal cream Place 1 Application rectally 2 (two) times daily. 30 g 1   hydrOXYzine  (ATARAX ) 25 MG tablet 1-2 tabs by mouth as needed for  itching or sleep 40 tablet 2   ketoconazole  (NIZORAL ) 2 % cream Apply 1 Application topically daily. To corners of the mouth 60 g 1   LORazepam  (ATIVAN ) 0.5 MG tablet Take 1 tablet (0.5 mg total) by mouth 2 (two) times daily as needed for anxiety. 60 tablet 5   Menthol , Topical Analgesic, (BIOFREEZE EX) Apply topically.     methylPREDNISolone  (MEDROL  DOSEPAK) 4 MG TBPK tablet 4 tabs by mouth per day x 3 days, 2 tabs x 3 days, 1 tab x 3 days 21 tablet 0   nitroGLYCERIN  (NITROSTAT ) 0.4 MG SL tablet Place 1 tablet (0.4 mg total) under the tongue every 5 (five) minutes as needed for chest pain. 90 tablet 3   pantoprazole  (PROTONIX ) 40 MG tablet Take 1 tablet (40 mg total) by mouth daily as needed. 90 tablet 3   traMADol  (ULTRAM ) 50 MG tablet Take 1 tablet (50 mg total) by mouth every 6 (six) hours as needed. 120 tablet 1   venlafaxine  XR (EFFEXOR -XR) 37.5 MG 24 hr capsule Take 1 capsule (37.5 mg total) by mouth daily with breakfast. 30 capsule 0   No current facility-administered medications  for this visit.    ROS: Review of Systems Respiratory:  Negative for shortness of breath.   Cardiovascular:  Negative for chest pain.  Gastrointestinal:  Positive for nausea, negative for  constipation, diarrhea, vomiting.  Neurological:  Negative for headaches.   Objective:  Psychiatric Specialty Exam: There were no vitals taken for this visit.There is no height or weight on file to calculate BMI.  General Appearance: Casual  Eye Contact:  Fair  Speech:  Clear and Coherent  Volume:  Normal  Mood:  not feeling well  Affect:  Blunt  Thought Content: Rumination   Suicidal Thoughts:  No  Homicidal Thoughts:  No  Thought Process:  Coherent  Orientation:  Full (Time, Place, and Person)    Memory:  Grossly intact   Judgment:  Intact  Insight:  Shallow  Concentration:  Concentration: Fair  Recall:  not formally assessed   Fund of Knowledge: Fair  Language: Fair  Psychomotor Activity:  Normal   Akathisia:  No  AIMS (if indicated): not done  Assets:  Communication Skills Desire for Improvement Financial Resources/Insurance Housing Intimacy Leisure Time Resilience Social Support Transportation  ADL's:  Intact  Cognition: WNL  Sleep:  Poor   PE: General: sits comfortably in view of camera; no acute distress  Pulm: no increased work of breathing on room air  MSK: all extremity movements appear intact  Neuro: no focal neurological deficits observed  Gait & Station: unable to assess by video    Metabolic Disorder Labs: Lab Results  Component Value Date   HGBA1C 6.0 10/09/2023   MPG 126 01/10/2023   MPG 122.63 09/10/2021   No results found for: PROLACTIN Lab Results  Component Value Date   CHOL 222 (H) 10/09/2023   TRIG 287.0 (H) 10/09/2023   HDL 38.10 (L) 10/09/2023   CHOLHDL 6 10/09/2023   VLDL 57.4 (H) 10/09/2023   LDLCALC 126 (H) 10/09/2023   LDLCALC 94 11/28/2022   Lab Results  Component Value Date   TSH 3.30 10/09/2023    Therapeutic Level Labs: No results found for: LITHIUM No results found for: CBMZ No results found for: VALPROATE  Screenings:  GAD-7    Flowsheet Row Office Visit from 04/02/2017 in [facility]  Total GAD-7 Score 2   PHQ2-9    Flowsheet Row Office Visit from 08/24/2024 in [facility] [place] Office Visit from 07/06/2024 in [facility] [place] Office Visit from 03/05/2024 in [facility] [place] Office Visit from 01/23/2024 in [facility] Clinical Support from 01/07/2024 in [facility]  PHQ-2 Total Score 0 4 0 3 3  PHQ-9 Total Score 0 16 0 8 8   Flowsheet Row Office Visit from 07/06/2024 in [facility] [place] ED from 04/16/2022 in [facility] Department at [facility] Admission (Discharged) from 09/08/2021 in  [place] [facility]  C-SSRS RISK CATEGORY Error: Q3, 4, or 5 should not be populated when Q2 is No No Risk No Risk    Collaboration of Care: Collaboration of Care: Psychiatrist AEB attending MD  Patient/Guardian was advised Release of Information must be obtained prior to any record release in order to collaborate their care with an outside provider. Patient/Guardian was advised if they have not already done so to contact the registration department to sign all necessary forms in order for us  to release information  regarding their care.   Consent: Patient/Guardian gives verbal consent for treatment and assignment of benefits for services provided during this visit. Patient/Guardian expressed understanding and agreed to proceed.   Corean Minor, MD, PGY-3 12/1/20254:31 PM

## 2024-10-12 ENCOUNTER — Telehealth (HOSPITAL_COMMUNITY): Admitting: Psychiatry

## 2024-10-12 DIAGNOSIS — F450 Somatization disorder: Secondary | ICD-10-CM

## 2024-10-12 DIAGNOSIS — F341 Dysthymic disorder: Secondary | ICD-10-CM

## 2024-10-12 DIAGNOSIS — F411 Generalized anxiety disorder: Secondary | ICD-10-CM

## 2024-10-12 MED ORDER — TRAZODONE HCL 50 MG PO TABS: 25.0000 mg | ORAL_TABLET | Freq: Every day | ORAL | 1 refills | Status: DC

## 2024-10-12 NOTE — Addendum Note (Signed)
 Addended by: CARVIN CROCK on: 10/12/2024 05:11 PM   Modules accepted: Level of Service

## 2024-10-22 ENCOUNTER — Ambulatory Visit (INDEPENDENT_AMBULATORY_CARE_PROVIDER_SITE_OTHER)

## 2024-10-22 ENCOUNTER — Encounter: Payer: Self-pay | Admitting: Cardiovascular Disease

## 2024-10-22 VITALS — BP 144/81 | HR 71 | Temp 97.6°F | Ht 67.0 in | Wt 175.5 lb

## 2024-10-22 DIAGNOSIS — I10 Essential (primary) hypertension: Secondary | ICD-10-CM | POA: Diagnosis not present

## 2024-10-22 DIAGNOSIS — F418 Other specified anxiety disorders: Secondary | ICD-10-CM

## 2024-10-22 DIAGNOSIS — Z87891 Personal history of nicotine dependence: Secondary | ICD-10-CM | POA: Diagnosis not present

## 2024-10-22 DIAGNOSIS — G4709 Other insomnia: Secondary | ICD-10-CM

## 2024-10-22 DIAGNOSIS — G8929 Other chronic pain: Secondary | ICD-10-CM

## 2024-10-22 DIAGNOSIS — G4733 Obstructive sleep apnea (adult) (pediatric): Secondary | ICD-10-CM

## 2024-10-22 NOTE — Progress Notes (Signed)
 Pulmonology Office Visit   Subjective:  Patient ID: Erik Ballweg., male    DOB: January 29, 1946  MRN: [id number]  Referred by: Norleen Lynwood ORN, MD  CC:  Chief Complaint  Patient presents with   Sleep Apnea    Recent sleep study. No cpap.     HPI Erik LANTZY Sr. is a 78 y.o. male with CAD status post PCI, allergic rhinitis, GERD/and depression/anxiety not well controlled, abdominal surgery with ileostomy causing chronic pain presents for OSA follow up.   Respective notes from provider reviewed as appropriate to gather relevant information for patient care.   Discussed the use of AI scribe software for clinical note transcription with the patient, who gave verbal consent to proceed.  History of Present Illness   [name] is a 78 year old male with chronic pain and sleep apnea who presents with sleep disturbances and elevated blood pressure.  He underwent ileorectal surgery in June 2020, followed by prostate and bladder removal, leading to persistent pain that has been difficult to manage. He has tried medications such as tramadol  and gabapentin  but experienced adverse effects including confusion, agitation, and nerve pain, making them intolerable.  He experiences significant sleep disturbances, getting only three to four hours of sleep per night, primarily due to pain. He goes to bed around 9:45 PM, takes an hour to an hour and a half to fall asleep, and wakes up at 3:00 AM, often unable to return to sleep. This pattern has become more consistent over the past month, with pain being a frequent but not exclusive cause of waking. He feels tired during the day.  His blood pressure has been elevated, reaching 166/80 today, which is the highest it has ever been. Typically, his morning blood pressure is well-controlled at around 110/55. He monitors his blood pressure daily and notes that it has been rising throughout the day. He has not been on any blood pressure medications  before and is concerned about the impact of his sleep issues on his blood pressure.  He has a history of sleep apnea and has tried a generic dental device for sleep apnea but found it intolerable due to PTSD-related discomfort.      OSA history: Dx Oct 2025>CPAP ordered.   Sleep routine:    -Bed: 9:45 PM, onset 20-30 minutes-Nocturnal awakenings: Multiple awakening   -Wake: 8-8 30 a.m.   -Napping: none .   PRIOR TESTS and IMAGING: November 2025: EF 60-65%, grade 1 diastolic dysfunction, PASP normal.  No significant valvular abnormalities. CT chest October 2025: Reviewed by me: Some ground glass changes at the bases slightly atelectasis but otherwise unremarkable lung parenchyma.  No PE. HST 09/11/2024 weight 172 pound AHI 4% 13.5, 3% 30.3.  O2 nadir 82%, desaturation less than 1 minute.  Some V shaped desaturation.      No data to display          Allergies: Ambien  [zolpidem ], Bentyl  [dicyclomine ], Carafate  [sucralfate ], Lansoprazole , Lexapro  [escitalopram  oxalate], Other, Pneumovax [pneumococcal polysaccharide vaccine], Prilosec [omeprazole ], Remeron  [mirtazapine ], Seroquel  [quetiapine ], Statins, Tizanidine , Tramadol , Tylenol  [acetaminophen ], and Pantoprazole  Current Medications[1] Past Medical History:  Diagnosis Date   Allergic rhinitis    Allergy     Anal fissure    Anemia    Anxiety    Blood transfusion without reported diagnosis 12/06/2022   also 2 in the past   Cataract    starting   Coronary atherosclerosis of native coronary artery 2007   nonobstructive CAD by cath  with 40% mid-LAD stenosis   Degenerative arthritis of knee, bilateral 10/09/2017   Depression    Diverticulosis 12/18/2011   Diverticulosis of colon with hemorrhage    April 2021, [facility]   Duodenal mass    Fibromyalgia    GERD with stricture    Helicobacter pylori (H. 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   Vitamin D  deficiency 11/29/2017   Past Surgical History:  Procedure Laterality Date   ADENOIDECTOMY     bladder distention     x 6   CARDIAC CATHETERIZATION     CATHETER REMOVAL     super pubic area   COLONOSCOPY     CORONARY STENT INTERVENTION N/A 05/29/2022   Procedure: CORONARY STENT INTERVENTION;  Surgeon: Jordan, Peter M, MD;  Location: [facility] INVASIVE CV LAB;  Service: Cardiovascular;  Laterality: N/A;   DENTAL SURGERY Right    #30 tooth extraction (right upper)   ESOPHAGOGASTRODUODENOSCOPY  12/17/2022   INTERSTIM IMPLANT PLACEMENT     INTERSTIM IMPLANT REMOVAL     KNEE ARTHROSCOPY     right x 2   LEFT HEART CATH AND CORONARY ANGIOGRAPHY N/A 05/29/2022   Procedure: LEFT HEART CATH AND CORONARY ANGIOGRAPHY;  Surgeon: Jordan, Peter M, MD;  Location: [facility] INVASIVE CV LAB;  Service: Cardiovascular;  Laterality: N/A;   NISSEN FUNDOPLICATION  2009   PROSTECTOMY     ROBOT ASSISTED LAPAROSCOPIC COMPLETE CYSTECT ILEAL CONDUIT     SHOULDER ARTHROSCOPY  2010   left   TONSILLECTOMY AND ADENOIDECTOMY  1957   TOTAL KNEE ARTHROPLASTY Right 09/08/2021   Procedure: TOTAL KNEE ARTHROPLASTY;  Surgeon: Duwayne Purchase, MD;  Location: [facility] OR[facility];  Service: Orthopedics;  Laterality: Right;   TRANSURETHRAL RESECTION OF PROSTATE     x 2   UPPER GASTROINTESTINAL ENDOSCOPY  05/13/2008   hiatal hernia   VASECTOMY     Family History  Problem Relation Age of Onset   Allergic rhinitis Mother    Heart disease Mother    Hypertension Mother    Stroke Mother     Arthritis Mother    Colon polyps Father    Diabetes Father    Heart disease Father    Hypertension Father    Diabetes Sister    Stroke Sister    Breast cancer Sister    Liver cancer Maternal Grandfather    Diabetes Paternal Uncle    Liver cancer Paternal Uncle    Colon cancer Neg Hx    Esophageal cancer Neg Hx    Social History   Socioeconomic History   Marital status: Married    Spouse name: Phillis   Number of children: 4   Years of education: Not on file   Highest education level: Not on file  Occupational History   Occupation: retired  Tobacco Use   Smoking status: Former    Passive exposure: Past  Smokeless tobacco: Never   Tobacco comments:    quit 1980  Vaping Use   Vaping status: Never Used  Substance and Sexual Activity   Alcohol  use: No    Alcohol /week: 0.0 standard drinks of alcohol    Drug use: No   Sexual activity: Yes    Partners: Female  Other Topics Concern   Not on file  Social History Narrative   Married, retired for children   He is a former smoker no alcohol  or substance use      Lives with wife   Social Drivers of Health   Tobacco Use: Medium Risk (10/22/2024)   Patient History    Smoking Tobacco Use: Former    Smokeless Tobacco Use: Never    Passive Exposure: Past  Physicist, Medical Strain: Low Risk (01/07/2024)   Overall Financial Resource Strain (CARDIA)    Difficulty of Paying Living Expenses: Not very hard  Food Insecurity: Low Risk (03/26/2024)   Received from [facility]   Epic    Within the past 12 months, you worried that your food would run out before you got money to buy more: Never true    Within the past 12 months, the food you bought just didn't last and you didn't have money to get more. : Never true  Transportation Needs: No Transportation Needs (03/26/2024)   Received from [facility]    In the past 12 months, has lack of reliable transportation kept you from medical appointments, meetings,  work or from getting things needed for daily living? : No  Recent Concern: Transportation Needs - Unmet Transportation Needs (03/25/2024)   Received from [facility]    In the past 12 months, has lack of reliable transportation kept you from medical appointments, meetings, work or from getting things needed for daily living? : Yes  Physical Activity: Insufficiently Active (01/07/2024)   Exercise Vital Sign    Days of Exercise per Week: 2 days    Minutes of Exercise per Session: 10 min  Stress: Stress Concern Present (01/07/2024)   [name]    Feeling of Stress : To some extent  Social Connections: Moderately Integrated (01/07/2024)   Social Connection and Isolation Panel    Frequency of Communication with Friends and Family: More than three times a week    Frequency of Social Gatherings with Friends and Family: More than three times a week    Attends Religious Services: More than 4 times per year    Active Member of Clubs or Organizations: No    Attends Banker Meetings: Never    Marital Status: Married  Catering Manager Violence: Not At Risk (01/07/2024)   Humiliation, Afraid, Rape, and Kick [name]    Fear of Current or Ex-Partner: No    Emotionally Abused: No    Physically Abused: No    Sexually Abused: No  Depression (PHQ2-9): Low Risk (08/24/2024)   Depression (PHQ2-9)    PHQ-2 Score: 0  Recent Concern: Depression (PHQ2-9) - High Risk (07/06/2024)   Depression (PHQ2-9)    PHQ-2 Score: 16  Alcohol  Screen: Low Risk (01/07/2024)   Alcohol  Screen    Last Alcohol  Screening Score (AUDIT): 0  Housing: Low Risk (04/28/2024)   Received from [facility]   Epic    What is your living situation today?: I have a steady place to live    Think about the place you live. Do you have problems with  any of the following? Choose all that apply:: None/None on this list  Utilities: Low Risk  (03/26/2024)   Received from [facility]   Utilities    In the past 12 months has the electric, gas, oil, or water  company threatened to shut off services in your home? : No  Health Literacy: Adequate Health Literacy (01/07/2024)   B1300 Health Literacy    Frequency of need for help with medical instructions: Never       Objective:  BP (!) 144/81   Pulse 71   Temp 97.6 F (36.4 C) (Oral)   Ht 5' 7 (1.702 m)   Wt 175 lb 8 oz (79.6 kg)   SpO2 99%   BMI 27.49 kg/m  BMI Readings from Last 3 Encounters:  10/22/24 27.49 kg/m  09/30/24 27.10 kg/m  08/28/24 27.00 kg/m    Physical Exam: Physical Exam   ENT: Normal mucosa. No hypertrophy of inferior turbinates. Tonsils are normal sized. Modified Mallampati score is normal. Oral cavity examined. PULMONARY: Lungs clear to auscultation bilaterally, no adventitious breath sounds. CARDIOVASCULAR: Regular rate and rhythm, S1 S2 normal, no murmurs. ABDOMEN: Abdomen soft, nontender. Bowel sounds are normal. EXTREMITIES: No peripheral edema noted.       Diagnostic Review:  Last metabolic panel Lab Results  Component Value Date   GLUCOSE 111 (H) 06/15/2024   NA 137 06/15/2024   K 4.2 06/15/2024   CL 101 06/15/2024   CO2 28 06/15/2024   BUN 17 06/15/2024   CREATININE 1.22 06/15/2024   GFR 57.29 (L) 06/15/2024   CALCIUM 9.5 06/15/2024   PROT 7.4 06/15/2024   ALBUMIN 4.4 06/15/2024   BILITOT 0.5 06/15/2024   ALKPHOS 62 06/15/2024   AST 17 06/15/2024   ALT 17 06/15/2024   ANIONGAP 7 07/11/2023         Assessment & Plan:   Assessment & Plan OSA (obstructive sleep apnea)     Depression with anxiety Other chronic pain Other insomnia Pain, anxiety not under control. Will need optimization to help with sleep. Discussed with patient to discuss about going on SSRI for anxiety.     Primary hypertension       Assessment and Plan    Obstructive sleep apnea Chronic obstructive sleep apnea causing daytime fatigue and  elevated blood pressure. CPAP therapy recommended and accepted. Dental device not tolerated due to PTSD. - Ordered CPAP machine. - Educated on CPAP use and benefits.  Hypertension Recent blood pressure elevation possibly due to sleep apnea. But only elevated today per patient. Otherwise has been in normal range.  - Rechecked blood pressure 144/82 - Follow up with primary care physician, Dr. Lynwood Molt, for blood pressure management. - no indication for addition of BP med.   Sleep disturbance secondary to obstructive sleep apnea and chronic pain Chronic sleep disturbance due to obstructive sleep apnea and pain. Pain management complicated by medication sensitivity. CPAP prioritized to address apnea before sedatives. - Prioritize CPAP therapy to address sleep apnea. - Reassess pain management options after CPAP therapy is established. - Coordinate with primary care for pain management. - will discuss sleep aids in future after CPAP set up.     Notes from Candis Dandy NP 09/30/24 reviewed as to gather relevant information for patient care and formulating plan.  He/She was counselled about not driving while drowsy which is common side effect of sleep related disorders.   Return for 1 month after CPAP setup.   I personally spent a total of 30 minutes in  the care of the patient today including preparing to see the patient, getting/reviewing separately obtained history, performing a medically appropriate exam/evaluation, counseling and educating, placing orders, documenting clinical information in the EHR, independently interpreting results, and communicating results.   Sammi Fredericks, MD     [1]  Current Outpatient Medications:    acetaminophen  (TYLENOL ) 500 MG tablet, Take 500 mg by mouth every 6 (six) hours as needed., Disp: , Rfl:    bismuth subsalicylate (PEPTO BISMOL) 262 MG/15ML suspension, Take 30 mLs by mouth every 6 (six) hours as needed., Disp: , Rfl:    hydrocortisone  (ANUSOL -HC)  2.5 % rectal cream, Place 1 Application rectally 2 (two) times daily., Disp: 30 g, Rfl: 1   hydrOXYzine  (ATARAX ) 25 MG tablet, 1-2 tabs by mouth as needed for itching or sleep, Disp: 40 tablet, Rfl: 2   ketoconazole  (NIZORAL ) 2 % cream, Apply 1 Application topically daily. To corners of the mouth, Disp: 60 g, Rfl: 1   LORazepam  (ATIVAN ) 0.5 MG tablet, Take 1 tablet (0.5 mg total) by mouth 2 (two) times daily as needed for anxiety., Disp: 60 tablet, Rfl: 5   Menthol , Topical Analgesic, (BIOFREEZE EX), Apply topically., Disp: , Rfl:    nitroGLYCERIN  (NITROSTAT ) 0.4 MG SL tablet, Place 1 tablet (0.4 mg total) under the tongue every 5 (five) minutes as needed for chest pain., Disp: 90 tablet, Rfl: 3   pantoprazole  (PROTONIX ) 40 MG tablet, Take 1 tablet (40 mg total) by mouth daily as needed., Disp: 90 tablet, Rfl: 3   traMADol  (ULTRAM ) 50 MG tablet, Take 1 tablet (50 mg total) by mouth every 6 (six) hours as needed., Disp: 120 tablet, Rfl: 1   traZODone  (DESYREL ) 50 MG tablet, Take 0.5-1 tablets (25-50 mg total) by mouth at bedtime., Disp: 30 tablet, Rfl: 1   methylPREDNISolone  (MEDROL  DOSEPAK) 4 MG TBPK tablet, 4 tabs by mouth per day x 3 days, 2 tabs x 3 days, 1 tab x 3 days (Patient not taking: Reported on 10/22/2024), Disp: 21 tablet, Rfl: 0   venlafaxine  XR (EFFEXOR -XR) 37.5 MG 24 hr capsule, Take 1 capsule (37.5 mg total) by mouth daily with breakfast. (Patient not taking: Reported on 10/22/2024), Disp: 30 capsule, Rfl: 0

## 2024-10-22 NOTE — Assessment & Plan Note (Signed)
 Pain, anxiety not under control. Will need optimization to help with sleep. Discussed with patient to discuss about going on SSRI for anxiety.

## 2024-10-22 NOTE — Patient Instructions (Signed)
°  VISIT SUMMARY: Today, we discussed your ongoing issues with sleep disturbances, elevated blood pressure, and chronic pain. We reviewed your history of sleep apnea and the challenges you've faced with pain management. We have developed a plan to address these concerns, starting with CPAP therapy for your sleep apnea.  YOUR PLAN: -OBSTRUCTIVE SLEEP APNEA: Obstructive sleep apnea is a condition where your airway becomes blocked during sleep, causing breathing interruptions and poor sleep quality. We have recommended CPAP therapy, which involves using a machine to keep your airway open during sleep. You have agreed to this treatment, and we have ordered a CPAP machine for you. We also provided education on how to use the CPAP machine and its benefits.  -HYPERTENSION: Hypertension, or high blood pressure, can be influenced by poor sleep quality. Your blood pressure has been elevated recently, possibly due to your sleep apnea. We rechecked your blood pressure today, and if it remains above 150, we will prescribe medication to help manage it. Please follow up with your primary care physician, Dr. Lynwood Molt, for ongoing blood pressure management.  -SLEEP DISTURBANCE SECONDARY TO OBSTRUCTIVE SLEEP APNEA AND CHRONIC PAIN: Your sleep disturbances are primarily due to obstructive sleep apnea and chronic pain. Since you have had difficulty tolerating pain medications, we are prioritizing CPAP therapy to improve your sleep apnea first. Once your CPAP therapy is established, we will reassess your pain management options. We will also coordinate with your primary care physician for further pain management strategies.  INSTRUCTIONS: Please follow up with your primary care physician, Dr. Lynwood Molt, for blood pressure management. If your blood pressure remains above 150, we will prescribe antihypertensive medication. Additionally, once you start using the CPAP machine, we will reassess your pain management  options.                      Contains text generated by [employer]bridge.                                 Contains text generated by [employer]bridge.

## 2024-10-27 ENCOUNTER — Telehealth: Payer: Self-pay | Admitting: Hematology and Oncology

## 2024-10-27 MED ORDER — CARVEDILOL 3.125 MG PO TABS: 3.1250 mg | ORAL_TABLET | Freq: Two times a day (BID) | ORAL | 3 refills | Status: AC

## 2024-10-27 NOTE — Telephone Encounter (Signed)
 I spoke w pt regarding 01/11/25 appt being rescheduled to 01/13/25. Patient is aware of new appt date and time.

## 2024-10-27 NOTE — Telephone Encounter (Signed)
 Blood pressure borderline elevated, mostly in the 120-130s, with some spike in blood pressure in the 140-150s. Let's add a low dose carvedilol  at 3.125mg  BID, this should help with both blood pressure and palpitation. [name]

## 2024-10-28 ENCOUNTER — Ambulatory Visit: Attending: Physician Assistant | Admitting: Physician Assistant

## 2024-10-28 ENCOUNTER — Encounter: Payer: Self-pay | Admitting: Physician Assistant

## 2024-10-28 VITALS — BP 132/70 | HR 62 | Ht 67.0 in | Wt 174.2 lb

## 2024-10-28 DIAGNOSIS — E785 Hyperlipidemia, unspecified: Secondary | ICD-10-CM

## 2024-10-28 DIAGNOSIS — I2510 Atherosclerotic heart disease of native coronary artery without angina pectoris: Secondary | ICD-10-CM | POA: Diagnosis not present

## 2024-10-28 DIAGNOSIS — I10 Essential (primary) hypertension: Secondary | ICD-10-CM

## 2024-10-28 MED ORDER — NITROGLYCERIN 0.4 MG SL SUBL: 0.4000 mg | SUBLINGUAL_TABLET | SUBLINGUAL | 3 refills | Status: AC | PRN

## 2024-10-28 NOTE — Progress Notes (Signed)
 Cardiology Office Note   Date:  10/28/2024  ID:  [name], DOB 1946/09/22, MRN [id number] PCP: Norleen Lynwood ORN, MD  [facility] Providers Cardiologist:  Darryle ONEIDA Decent, MD     History of Present Illness DUSTINE STICKLER Sr. is a 78 y.o. male with past medical history of CAD s/p PCI proLAD 05/28/2022, HTN, HLD, GI bleed and history of SVT.  The patient is intolerant of statins and Zetia .  He had massive GI bleed at [facility] in 2021.  EGD in March 2023 showed gastritis.  Echocardiogram obtained on 05/21/2022 showed EF of 60 to 65%, no regional wall motion abnormality, grade 1 DD, RVSP 27.4 mmHg, normal RV, trivial MR, mild AR.  Patient had GI bleed in October 2023 and was admitted at [facility].  No source of GI bleed was identified.  Cardiac catheterization performed in July 2023 showed 70 to 99% proximal LAD lesion with FFR of 0.57, this was treated with a 3.0 x 20 mm Synergy DES, patient had 50% mid left circumflex and 50% ostial RCA residual.  Heart monitor obtained in January 2024 showed brief SVT, longest duration 6.3 seconds, primarily sinus rhythm with average heart rate of 74 bpm, PVC burden less than 1%.  Cardiac PET stress test obtained on 08/29/2023 showed no evidence of ischemia or infarction, EF 60%, overall low risk study.  Patient was seen in the ED in May 2025 due to acute pyelonephritis.  He was seen in [facility] ED on 08/19/2024 due to dyspnea.  Symptom primarily happens with exertion.  He also had mild pedal edema.  D-dimer was elevated at 713.  Troponin negative.  Chest x-ray showed a mild left basilar subsegmental atelectasis, no other acute process.  I last saw the patient on 08/28/2024 for shortness of breath and fatigue.  He has chronic abdominal discomfort.  Given the elevation of the D-dimer, I recommended a CTA with PE protocol.  CTA obtained on 09/03/2024 was negative for PE, mild cardiomegaly, small hiatal hernia, coronary calcification.  Echocardiogram obtained  on 09/22/2024 showed EF 60 to 65%, no regional wall motion abnormality, normal RV, mild AI.  He was referred to endocrinology service due to rising cortisol level.  More recently, he contacted us  regarding elevation of blood pressure and palpitation.  I prescribed a 3.125 mg twice a day of carvedilol  that was sent to his pharmacy yesterday.  Patient presents today for follow-up.  He has not picked up the carvedilol  yet.  Blood pressure has been in the 120s to 140s at home.  I instructed the patient to pick up carvedilol .  He will let us  know if he has any side effect from carvedilol  such as dizziness or depression.  He could not tolerate Effexor .  He has no lower extremity edema, orthopnea or PND.  He does have crackles in the right base of the lung suggestive of atelectasis.  He still has occasional abdominal discomfort which is chronic for him.  He also has rare episode of chest discomfort.  He is aware to contact cardiology service if chest pain increasing frequency, duration or intensity.   ROS:   Patient has rare intermittent chest discomfort at rest but not with exertion.  He has chronic intermittent abdominal pain.  He has no lower extremity edema, orthopnea or PND  Studies Reviewed      Cardiac Studies & Procedures   ______________________________________________________________________________________________ CARDIAC CATHETERIZATION  CARDIAC CATHETERIZATION 05/29/2022  Conclusion   Ost RCA lesion is 50%  stenosed.   Prox LAD to Mid LAD lesion is 70% stenosed.   Prox LAD lesion is 90% stenosed.   Mid Cx lesion is 50% stenosed.   A drug-eluting stent was successfully placed using a SYNERGY XD 3.0X20.   Post intervention, there is a 0% residual stenosis.   Post intervention, there is a 0% residual stenosis.   LV end diastolic pressure is normal.  Single vessel obstructive CAD Normal LVEDP Successful PCI of the proximal to mid LAD with OCT guidance and DES x 1  Plan: DAPT for 6  months. Anticipate same day DC.  Findings Coronary Findings Diagnostic  Dominance: Right  Left Anterior Descending Prox LAD lesion is 90% stenosed. The lesion is moderately calcified. Optical coherence tomography (OCT) was performed. Prox LAD to Mid LAD lesion is 70% stenosed.  Left Circumflex Mid Cx lesion is 50% stenosed.  Right Coronary Artery Ost RCA lesion is 50% stenosed.  Intervention  Prox LAD lesion Stent (Also treats lesions: Prox LAD to Mid LAD) CATH VISTA GUIDE 6FR XBLAD3.5 guide catheter was inserted. Lesion crossed with guidewire using a WIRE ASAHI PROWATER 180CM. Pre-stent angioplasty was performed using a BALLN SAPPHIRE 2.5X12. A drug-eluting stent was successfully placed using a SYNERGY XD 3.0X20. Stent strut is well apposed. Post-stent angioplasty was performed using a BALL SAPPHIRE NC24 3.5X12. Maximum pressure:  20 atm. Post-Intervention Lesion Assessment The intervention was successful. Pre-interventional TIMI flow is 3. Post-intervention TIMI flow is 3. No complications occurred at this lesion. Optical coherence tomography (OCT) was performed. Minimum stent area 7.5 mm. There is a 0% residual stenosis post intervention.  Prox LAD to Mid LAD lesion Stent (Also treats lesions: Prox LAD) See details in Prox LAD lesion. Post-Intervention Lesion Assessment The intervention was successful. Pre-interventional TIMI flow is 3. Post-intervention TIMI flow is 3. No complications occurred at this lesion. There is a 0% residual stenosis post intervention.   STRESS TESTS  NM PET CT CARDIAC PERFUSION MULTI W/ABSOLUTE BLOODFLOW 08/29/2023  Narrative   LV perfusion is normal. There is no evidence of ischemia. There is no evidence of infarction.   Rest left ventricular function is normal. Rest EF: 60%. Stress left ventricular function is normal. Stress EF: 68%. End diastolic cavity size is normal. End systolic cavity size is normal.   Myocardial blood flow was computed to  be 0.77ml/g/min at rest and 2.60ml/g/min at stress. Global myocardial blood flow reserve was 2.77 and was normal.   Not commented on due to prior LaD stent   The study is normal. The study is low risk.  CLINICAL DATA:  This over-read does not include interpretation of cardiac or coronary anatomy or pathology. The cardiac PET-CT interpretation by the cardiologist is attached.  COMPARISON:  None Available.  FINDINGS: No suspicious nodules, masses, or infiltrates are identified in the visualized portion of the lungs. No pleural fluid seen.  The visualized portions of the mediastinum and chest wall are unremarkable. Small hiatal hernia is seen.  IMPRESSION: Small hiatal hernia.   Electronically Signed By: Norleen DELENA Kil M.D. On: 08/29/2023 12:32   ECHOCARDIOGRAM  ECHOCARDIOGRAM COMPLETE 09/22/2024  Narrative ECHOCARDIOGRAM REPORT    Patient Name:   JAHMAI FINELLI Sr. Date of Exam: 09/22/2024 Medical Rec #:  [id number]          Height:       67.0 in Accession #:    [id number]         Weight:       172.4 lb Date of Birth:  1946-05-15           BSA:          1.899 m Patient Age:    78 years           BP:           116/58 mmHg Patient Gender: M                  HR:           69 bpm. Exam Location:  [place]  Procedure: 2D Echo, Cardiac Doppler, Color Doppler and Strain Analysis (Both Spectral and Color Flow Doppler were utilized during procedure).  Indications:    SOB (shortness of breath) [R06.02 (ICD-10-CM)]  History:        Patient has prior history of Echocardiogram examinations, most recent 05/21/2022. CAD and Previous Myocardial Infarction, Arrythmias:Bradycardia, Signs/Symptoms:Fatigue and Dizziness/Lightheadedness; Risk Factors:Dyslipidemia. Prior echo- 60-65% EF, mild AI.  Sonographer:    Saddie Chimes Referring Phys: [phone] Bitha Fauteux   Sonographer Comments: No subcostal window. IMPRESSIONS   1. Left ventricular ejection fraction, by estimation, is 60 to 65%.  The left ventricle has normal function. The left ventricle has no regional wall motion abnormalities. Left ventricular diastolic parameters are consistent with Grade I diastolic dysfunction (impaired relaxation). The average left ventricular global longitudinal strain is 19.9 %. The global longitudinal strain is normal. 2. Right ventricular systolic function is normal. The right ventricular size is normal. There is normal pulmonary artery systolic pressure. 3. The mitral valve is normal in structure. No evidence of mitral valve regurgitation. No evidence of mitral stenosis. 4. The aortic valve is normal in structure. Aortic valve regurgitation is mild. No aortic stenosis is present. 5. The inferior vena cava is normal in size with greater than 50% respiratory variability, suggesting right atrial pressure of 3 mmHg.  FINDINGS Left Ventricle: Left ventricular ejection fraction, by estimation, is 60 to 65%. The left ventricle has normal function. The left ventricle has no regional wall motion abnormalities. The average left ventricular global longitudinal strain is 19.9 %. Strain was performed and the global longitudinal strain is normal. The left ventricular internal cavity size was normal in size. There is no left ventricular hypertrophy. Left ventricular diastolic parameters are consistent with Grade I diastolic dysfunction (impaired relaxation).  Right Ventricle: The right ventricular size is normal. No increase in right ventricular wall thickness. Right ventricular systolic function is normal. There is normal pulmonary artery systolic pressure. The tricuspid regurgitant velocity is 2.32 m/s, and with an assumed right atrial pressure of 3 mmHg, the estimated right ventricular systolic pressure is 24.5 mmHg.  Left Atrium: Left atrial size was normal in size.  Right Atrium: Right atrial size was normal in size.  Pericardium: There is no evidence of pericardial effusion.  Mitral Valve: The mitral  valve is normal in structure. No evidence of mitral valve regurgitation. No evidence of mitral valve stenosis. MV peak gradient, 3.0 mmHg. The mean mitral valve gradient is 1.0 mmHg.  Tricuspid Valve: The tricuspid valve is normal in structure. Tricuspid valve regurgitation is not demonstrated. No evidence of tricuspid stenosis.  Aortic Valve: The aortic valve is normal in structure. Aortic valve regurgitation is mild. Aortic regurgitation PHT measures 370 msec. No aortic stenosis is present. Aortic valve mean gradient measures 2.0 mmHg. Aortic valve peak gradient measures 4.0 mmHg. Aortic valve area, by VTI measures 3.08 cm.  Pulmonic Valve: The pulmonic valve was normal in structure. Pulmonic valve regurgitation is not visualized. No evidence  of pulmonic stenosis.  Aorta: The aortic root is normal in size and structure.  Venous: The inferior vena cava is normal in size with greater than 50% respiratory variability, suggesting right atrial pressure of 3 mmHg.  IAS/Shunts: No atrial level shunt detected by color flow Doppler.   LEFT VENTRICLE PLAX 2D LVIDd:         3.80 cm   Diastology LVIDs:         2.50 cm   LV e' medial:    8.05 cm/s LV PW:         1.00 cm   LV E/e' medial:  6.1 LV IVS:        0.90 cm   LV e' lateral:   7.18 cm/s LVOT diam:     2.20 cm   LV E/e' lateral: 6.9 LV SV:         62 LV SV Index:   33        2D Longitudinal Strain LVOT Area:     3.80 cm  2D Strain GLS Avg:     19.9 %   RIGHT VENTRICLE RV Basal diam:  3.30 cm RV Mid diam:    3.20 cm TAPSE (M-mode): 1.9 cm  LEFT ATRIUM           Index LA diam:      3.40 cm 1.79 cm/m LA Vol (A4C): 34.3 ml 18.07 ml/m AORTIC VALVE                    PULMONIC VALVE AV Area (Vmax):    2.95 cm     PV Vmax:       1.27 m/s AV Area (Vmean):   2.80 cm     PV Vmean:      78.400 cm/s AV Area (VTI):     3.08 cm     PV VTI:        0.198 m AV Vmax:           99.80 cm/s   PV Peak grad:  6.5 mmHg AV Vmean:          67.000  cm/s  PV Mean grad:  3.0 mmHg AV VTI:            0.202 m AV Peak Grad:      4.0 mmHg AV Mean Grad:      2.0 mmHg LVOT Vmax:         77.57 cm/s LVOT Vmean:        49.267 cm/s LVOT VTI:          0.164 m LVOT/AV VTI ratio: 0.81 AI PHT:            370 msec AR Vena Contracta: 0.60 cm  AORTA Ao Asc diam: 3.73 cm  MITRAL VALVE               TRICUSPID VALVE MV Area (PHT): 2.08 cm    TR Peak grad:   21.5 mmHg MV Area VTI:   1.69 cm    TR Vmax:        232.00 cm/s MV Peak grad:  3.0 mmHg MV Mean grad:  1.0 mmHg    SHUNTS MV Vmax:       0.86 m/s    Systemic VTI:  0.16 m MV Vmean:      45.4 cm/s   Systemic Diam: 2.20 cm MV Decel Time: 364 msec MV E velocity: 49.30 cm/s MV A velocity: 78.40 cm/s MV E/A ratio:  0.63  Jennifer Crape MD Electronically signed by Jennifer Crape MD Signature Date/Time: 09/22/2024/12:16:00 PM    Final    MONITORS  LONG TERM MONITOR (3-14 DAYS) 12/04/2022  Narrative Patch Wear Time:  7 days and 23 hours (2024-01-08T15:37:28-0500 to 2024-01-16T14:42:25-0500)  Patient had a min HR of 50 bpm (sinus bradycardia), max HR of 152 bpm (supraventricular tachycardia), and avg HR of 74 bpm (normal sinus rhythm). Predominant underlying rhythm was Sinus Rhythm. 3 Supraventricular Tachycardia runs occurred, the run with the fastest interval lasting 13 beats (6.3 second duration) with a max rate of 152 bpm (avg 126 bpm); the run with the fastest interval was also the longest. Supraventricular Tachycardia was detected within +/- 45 seconds of symptomatic patient event(s). Isolated SVEs were rare (<1.0%), SVE Couplets were rare (<1.0%), and SVE Triplets were rare (<1.0%). Isolated VEs were rare (<1.0%), and no VE Couplets or VE Triplets were present.  Impression: Brief supraventricular tachycardia was detected (3 episodes in 7 days; longest duration 6.3 seconds). Rare ectopy.  Darryle T. Barbaraann, MD, [facility]  [facility] [address], [address] [place], [place] [address] [phone] 2:36 PM   CT SCANS  CT CORONARY FRACTIONAL FLOW RESERVE DATA PREP 05/16/2022  Narrative EXAM: CT FFR analysis was performed on the original cardiac CTA dataset. Diagrammatic representation of the CT FFR analysis is provided in a separate PDF document in PACS. This dictation was created using the PDF document and an interactive 3D model of the results. The 3D model is not available in the EMR/PACS.  INTERPRETATION: CT FFR provides simultaneous calculation of pressure and flow across the entire coronary tree. For clinical decision making, CT FFR values should be obtained 1-2 cm distal to the lower border of each stenosis measured. Coronary CTA-related artifacts may impair the diagnostic accuracy of the original cardiac CTA and FFR CT results. *Due to the fact that CT FFR represents a mathematically-derived analysis, it is recommended that the results be interpreted as follows:  1. CT FFR >0.80: Low likelihood of hemodynamic significance. 2. CT FFR 0.76-0.80: Borderline likelihood of hemodynamic significance. 3. CT FFR =< 0.75: High likelihood of hemodynamic significance.  *Coronary CT Angiography-derived Fractional Flow Reserve Testing in Patients with Stable Coronary Artery Disease: Recommendations on Interpretation and Reporting. Radiology: Cardiothoracic Imaging. 2019;1(5):e190050  FINDINGS: 1. Left Main: 0.99; low likelihood of hemodynamic significance. 2. Prox LAD: 0.57; low likelihood of hemodynamic significance.  3. LCX: 0.91; low likelihood of hemodynamic significance. 4. RCA: 0.88; low likelihood of hemodynamic significance.  IMPRESSION:  1.  Flow limiting stenosis in the proximal LAD (CT FFR 0.57).  Darryle Barbaraann, MD   Electronically Signed By: Darryle Barbaraann M.D. On: 05/16/2022 17:32   CT SCANS  CT CORONARY MORPH W/CTA COR W/SCORE 05/16/2022  Addendum 05/16/2022  5:30 PM ADDENDUM REPORT: 05/16/2022  17:28  CLINICAL DATA:  Chest pain  EXAM: Cardiac/Coronary CTA  TECHNIQUE: A non-contrast, gated CT scan was obtained with axial slices of 3 mm through the heart for calcium scoring. Calcium scoring was performed using the Agatston method. A 120 kV prospective, gated, contrast cardiac scan was obtained. Gantry rotation speed was 250 msecs and collimation was 0.6 mm. Two sublingual nitroglycerin  tablets (0.8 mg) were given. The 3D data set was reconstructed in 5% intervals of the 35-75% of the R-R cycle. Diastolic phases were analyzed on a dedicated workstation using MPR, MIP, and VRT modes. The patient received 95 cc of contrast.  FINDINGS: Image quality: Excellent.  Noise artifact is: Limited.  Coronary  Arteries:  Normal coronary origin.  Right dominance.  Left main: The left main is a large caliber vessel with a normal take off from the left coronary cusp that bifurcates to form a left anterior descending artery and a left circumflex artery. There is minimal mixed density plaque (<25%).  Left anterior descending artery: The proximal LAD contains a severe mixed density plaque (70-99%). The mid and distal segments are patent. D1 is patent. D2 contains minimal non-calcified plaque (<25%).  Left circumflex artery: The LCX is non-dominant. The proximal and mid segments contains minimal mixed density plaque (<25%). OM1 contains minimal mixed density plaque (<25%). OM2 is patent.  Right coronary artery: The RCA is dominant with normal take off from the right coronary cusp. There is minimal calcified plaque (<25%). The RCA terminates as a PDA and right posterolateral branch without evidence of plaque or stenosis.  Right Atrium: Right atrial size is within normal limits.  Right Ventricle: The right ventricular cavity is within normal limits.  Left Atrium: Left atrial size is normal in size with no left atrial appendage filling defect.  Left Ventricle: The ventricular  cavity size is within normal limits.  Pulmonary arteries: Normal in size without proximal filling defect.  Pulmonary veins: Normal pulmonary venous drainage.  Pericardium: Normal thickness without significant effusion or calcium present.  Cardiac valves: The aortic valve is trileaflet without significant calcification. The mitral valve is normal without significant calcification.  Aorta: Normal caliber without significant disease.  Extra-cardiac findings: See attached radiology report for non-cardiac structures.  IMPRESSION: 1. Coronary calcium score of 675. This was 70th percentile for age-, sex, and race-matched controls. Plaque volume 549 mm3.  2. Normal coronary origin with right dominance.  3. Severe stenosis in the proximal LAD (70-99%).  4. Minimal CAD (<25%) in the LCX/RCA.  RECOMMENDATIONS: 1. Severe stenosis in the proximal LAD. (70-99% or > 50% left main). Cardiac catheterization or CT FFR is recommended. Consider symptom-guided anti-ischemic pharmacotherapy as well as risk factor modification per guideline directed care. Invasive coronary angiography recommended with revascularization per published guideline statements.  Darryle Decent, MD   Electronically Signed By: Darryle Decent M.D. On: 05/16/2022 17:28  Narrative EXAM: OVER-READ INTERPRETATION  CT CHEST  The following report is a limited chest CT over-read performed by radiologist Dr. MYRTIS Stammer of [facility] on 05/16/2022. The coronary CTA interpretation by the cardiologist is attached.  COMPARISON:  None Available.  FINDINGS: Vascular: Normal caliber thoracic aorta.  No dissection.  Mediastinum/Nodes: No mediastinal or hilar mass or lymphadenopathy. The esophagus is grossly normal. There is a small hiatal hernia noted.  Lungs/Pleura: No acute pulmonary process. No pleural effusions. Mild dependent subpleural atelectasis. No worrisome pulmonary lesions or pulmonary  nodules.  Upper Abdomen: No significant upper abdominal findings.  Musculoskeletal: No significant bony findings.  IMPRESSION: No significant extracardiac findings.  Electronically Signed: By: MYRTIS Stammer M.D. On: 05/16/2022 16:16     ______________________________________________________________________________________________      Risk Assessment/Calculations           Physical Exam VS:  BP 132/70   Pulse 62   Ht 5' 7 (1.702 m)   Wt 174 lb 3.2 oz (79 kg)   SpO2 95%   BMI 27.28 kg/m        Wt Readings from Last 3 Encounters:  10/28/24 174 lb 3.2 oz (79 kg)  10/22/24 175 lb 8 oz (79.6 kg)  09/30/24 173 lb (78.5 kg)    GEN: Well nourished, well developed in no  acute distress NECK: No JVD; No carotid bruits CARDIAC: RRR, no murmurs, rubs, gallops RESPIRATORY:  Clear to auscultation without rales, wheezing or rhonchi  ABDOMEN: Soft, non-tender, non-distended EXTREMITIES:  No edema; No deformity   ASSESSMENT AND PLAN  CAD: Previously underwent stenting of proximal LAD in 2023.  PET stress test in late 2024 was normal.  Patient has rare intermittent chest discomfort that seems to be transient and does not occur with exertion.  Will continue monitoring at this time patient is aware to contact cardiology service if symptom worsens.  Hypertension: Blood pressure has been elevated between 120-160s at home.  We sent in a prescription of carvedilol  3.125 mg twice a day yesterday to his pharmacy however he has not picked up the medication yet.  The carvedilol  should also help with his anxiety and palpitation as well.  He denies any major depression.  He is aware to stop the beta-blocker if he does feel depressed or significantly dizzy.  Hyperlipidemia: Intolerant of statins        Dispo: Follow-up with Dr. Barbaraann in 6 months  Signed, Artelia Game, [place]

## 2024-10-28 NOTE — Patient Instructions (Signed)
 Thank you for choosing [facility]!     Medication Instructions:  Pick up the Carvedilol  3.125. Take one tablet twice daily. *If you need a refill on your cardiac medications before your next appointment, please call your pharmacy*   Lab Work: No labs were ordered during today's visit.  If you have labs (blood work) drawn today and your tests are completely normal, you will receive your results only by: MyChart Message (if you have MyChart) OR A paper copy in the mail If you have any lab test that is abnormal or we need to change your treatment, we will call you to review the results.   Testing/Procedures: No procedures were ordered during today's visit.   Your next appointment:   6 month(s)   A letter will be mailed to you as a reminder to call the office for your next follow up appointment.    Provider:   Darryle ONEIDA Decent, MD     Follow-Up: At [facility], you and your health needs are our priority.  As part of our continuing mission to provide you with exceptional heart care, we have created designated Provider Care Teams.  These Care Teams include your primary Cardiologist (physician) and Advanced Practice Providers (APPs -  Physician Assistants and Nurse Practitioners) who all work together to provide you with the care you need, when you need it. We recommend signing up for the patient portal called MyChart.  Sign up information is provided on this After Visit Summary.  MyChart is used to connect with patients for Virtual Visits (Telemedicine).  Patients are able to view lab/test results, encounter notes, upcoming appointments, etc.  Non-urgent messages can be sent to your provider as well.   To learn more about what you can do with MyChart, go to [email or web address].
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
## 2024-11-30 ENCOUNTER — Telehealth (HOSPITAL_COMMUNITY): Admitting: Psychiatry

## 2024-11-30 DIAGNOSIS — F341 Dysthymic disorder: Secondary | ICD-10-CM

## 2024-11-30 DIAGNOSIS — F411 Generalized anxiety disorder: Secondary | ICD-10-CM

## 2024-11-30 DIAGNOSIS — F450 Somatization disorder: Secondary | ICD-10-CM

## 2024-11-30 MED ORDER — VENLAFAXINE HCL ER 37.5 MG PO CP24: 37.5000 mg | ORAL_CAPSULE | Freq: Every day | ORAL | 0 refills | Status: DC

## 2024-12-01 NOTE — Addendum Note (Signed)
 Addended by: CARVIN CROCK on: 12/01/2024 12:27 PM   Modules accepted: Level of Service

## 2024-12-09 NOTE — Progress Notes (Signed)
 " Psychiatric Follow-up Adult Assessment  Patient Identification: Erik GILBERT Sr. MRN:  [id number] Date of Evaluation:  12/16/2024  Televisit via video: I connected with Erik LITTIE Lanius Sr. on 12/16/24 at 10:30 AM EST by a video enabled telemedicine application and verified that I am speaking with the correct person using two identifiers.  Location: Patient: Industry Provider:    I discussed the limitations of evaluation and management by telemedicine and the availability of in person appointments. The patient expressed understanding and agreed to proceed.  I discussed the assessment and treatment plan with the patient. The patient was provided an opportunity to ask questions and all were answered. The patient agreed with the plan and demonstrated an understanding of the instructions.   The patient was advised to call back or seek an in-person evaluation if the symptoms worsen or if the condition fails to improve as anticipated.  Assessment:  Erik CRYMES Sr. is a 79 y.o. male with a history of MDD, somatization disorder, 1 prior suicide attempt and hospitalization who presents to [facility] for follow-up evaluation of medications and mood.  Patient appears to have continued increased anxiety which is exacerbated by nocebo effect with patient reading about all medication side effects. Educated patietn on nocebo effect. Patient has had a brief trial with starting effexor . Patient appears to have low tolerance for perceived medication side effects and considering nonadherence to regimen. Continued to encourage patient taking current medication regimen given anticipated benefit for his anxiety. Patient appears to continue to have increased periods of anxiety, we discussed behavioral strategies including breathing exercises, progressive muscle relaxation, and lower temperature to decrease distress tolerance in the moment. We also discussed could trial gabapentin  for these periods  of increased anxiety.   Risk Assessment: An assessment of suicide and violence risk factors was performed as part of this evaluation and is not significantly changed from the last visit.             While future psychiatric events cannot be accurately predicted, the patient does not currently require acute inpatient psychiatric care and does not currently meet [place]  involuntary commitment criteria.  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   Return to care in: Future Appointments  Date Time Provider Department Center  12/30/2024  1:00 PM Graham Krabbe, MD [facility] None  01/06/2025  3:30 PM Theodoro Lakes, MD [facility] [id number]  01/07/2025  3:10 PM [facility]  WELLNESS VISIT [facility] Stains  01/13/2025  1:30 PM [facility] None  01/13/2025  2:00 PM Iruku, Amber, MD [facility] None  01/21/2025  2:40 PM Motwani, Obadiah,  MD [facility] None    Patient was given contact information for behavioral health clinic and was instructed to call 911 for emergencies.    Patient and plan of care will be discussed with the Attending MD, who agrees with the above statement and plan.   Subjective:  Chief Complaint:  medication management   Interval History:   -messaged pulmonology about next appt -PDMP last filled ativan  0.5mg  60# 30 days on 11/30/24 -saw PCP, recommended continuing effexor  and starting bentyl  nightly. Recommend discontinuing lorazepam , famotidine , protonix , and trazodone . Recommend continuing tramadol  nightly. Placed referral to pain clinic.  --appointment with urology on 12/28/24   Patient reports mood is having a couple of good days since taking bentyl  with the effexor . He reports continuing to feel somewhat tense and wound up, feels that dealing with pain is primary cause. 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 He reports taking the effexor  and bentyl  for about 8 days. He reports having a good day with sleeping at night. He reports noticing a bit more energy. Patient reports overall getting improved sleep besides having a rough night yesterday where he had increased anxiety and felt inability to move. Patient reports good appetite. Patient reports stressors include none. Patient reports non-adherence with medications. He reports he has not been taking lorazepam  and reports sparingly taking tramadol  as well. He reports he feels that the hydroyxzine is sedating even with using half of the pill and not sure if helps with anxiety. We discussed using gabapentin  as needed for periods of increased anxiety.  Patient reports no new substance use. Patient reports passive SI with severe pain and anxiety. He denies HI, AVH.  We completed the MMSE today and patient was able to be complete word recall in 5 minutes.   Past Psychiatric History:   Diagnoses: MDD, GAD, somatization disorder Medication trials: remeron  15, celexa  10, elavil , valium , cymbalta  60, lexapro  10, hydroxyzine , seroquel , gabapentin , lyrica , ambien , lunesta , amitryptyline (ineffective),  trazodone  (ineffective)  Reports that he is sensitive to medications  Remeron , took for 3 days then around a month: reports feeling crazy- felt closed in, chest pain, SOB, labile blood pressure, constipation.  Lexapro : felt crazy, felt like anything goes and out of control, don't recall time took it    Cymbalta : felt like a zombie, felt crazy  Celexa : does not remember    Hydroxyzine : feels sedated  Lorazepam  (states 18 years), takes 0.25mg , reports is affecting his memory    Restarted gabapentin  6-7 days ago Previous psychiatrist/therapist: Richerd Ling - therapist, also saw [facility] psychologist previously, not currently seeing psychologist  Hospitalizations: reports in 1986 when wife left him for 3 weeks  Suicide attempts: yes, overdose on medications in 1986  SIB: denies  Hx of violence towards others: denies  Current access to guns: denies  Hx of trauma/abuse: denies   Substance Abuse History in the last 12 months:  No. Denies substance use   Past Medical History:  Past Medical History:  Diagnosis Date   Allergic rhinitis    Allergy     Anal fissure    Anemia    Anxiety    Blood transfusion without reported diagnosis 12/06/2022   also 2 in the past   Cataract    starting  Coronary atherosclerosis of native coronary artery 2007   nonobstructive CAD by cath with 40% mid-LAD stenosis   Degenerative arthritis of knee, bilateral 10/09/2017   Depression    Diverticulosis 12/18/2011   Diverticulosis of colon with hemorrhage    April 2021, [facility]   Duodenal mass    Fibromyalgia    GERD with stricture    Helicobacter pylori (H. pylori) infection 11/25/2012   11/2012 EGD + gastric bxs   Hiatal hernia    HLD (hyperlipidemia)    HTN  (hypertension)    Hyperlipidemia    IBS (irritable bowel syndrome)    Impotence of organic origin    Insomnia    Internal and external hemorrhoids without complication    Interstitial cystitis    Irritable bowel syndrome 06/09/2010   Qualifier: Diagnosis of  By: Avram MD, NOLIA Pitts E    Kidney stone    Osteoporosis    Pelvic floor dysfunction 11/29/2017   Pneumonia    Reactive hypoglycemia    Rheumatoid arthritis(714.0)    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   Vitamin D  deficiency 11/29/2017    Past Surgical History:  Procedure Laterality Date   ADENOIDECTOMY     bladder distention     x 6   CARDIAC CATHETERIZATION     CATHETER REMOVAL     super pubic area   COLONOSCOPY     CORONARY STENT INTERVENTION N/A 05/29/2022   Procedure: CORONARY STENT INTERVENTION;  Surgeon: Jordan, Peter M, MD;  Location: [facility] INVASIVE CV LAB;  Service: Cardiovascular;  Laterality: N/A;   DENTAL SURGERY Right    #30 tooth extraction (right upper)   ESOPHAGOGASTRODUODENOSCOPY  12/17/2022   INTERSTIM IMPLANT PLACEMENT     INTERSTIM IMPLANT REMOVAL     KNEE ARTHROSCOPY     right x 2   LEFT HEART CATH AND CORONARY ANGIOGRAPHY N/A 05/29/2022   Procedure: LEFT HEART CATH AND CORONARY ANGIOGRAPHY;  Surgeon: Jordan, Peter M, MD;  Location: [facility] INVASIVE CV LAB;  Service: Cardiovascular;  Laterality: N/A;   NISSEN FUNDOPLICATION  2009   PROSTECTOMY     ROBOT ASSISTED LAPAROSCOPIC COMPLETE CYSTECT ILEAL CONDUIT     SHOULDER ARTHROSCOPY  2010   left   TONSILLECTOMY AND ADENOIDECTOMY  1957   TOTAL KNEE ARTHROPLASTY Right 09/08/2021   Procedure: TOTAL KNEE ARTHROPLASTY;  Surgeon: Duwayne Purchase, MD;  Location: [facility] ORS;  Service: Orthopedics;  Laterality: Right;   TRANSURETHRAL RESECTION OF PROSTATE     x 2   UPPER GASTROINTESTINAL ENDOSCOPY  05/13/2008   hiatal hernia   VASECTOMY     PCP: [facility] Dx: CAD, GERD, CKD3a, h/o IDA, interstitial cystitis Surgeries: cystectomy and ileal conduit for  end stage interstitial cystitis, Nissen fundoplication  Trauma: denies head trauma Seizures: denies   Family Psychiatric History:  Denies any family psychiatric history  Family History:  Family History  Problem Relation Age of Onset   Allergic rhinitis Mother    Heart disease Mother    Hypertension Mother    Stroke Mother    Arthritis Mother    Colon polyps Father    Diabetes Father    Heart disease Father    Hypertension Father    Diabetes Sister    Stroke Sister    Breast cancer Sister    Liver cancer Maternal Grandfather    Diabetes Paternal Uncle    Liver cancer Paternal Uncle    Colon cancer Neg Hx    Esophageal cancer Neg Hx     Social  History:   Academic/Vocational: got GED, denies college Housing: live with wife Income: reports disabled since 2000 for interstitial cystitis, runs a business - an buyer, 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    Support: consider wife and church to be support system. Goes to [employer].  Children: 4 (grown). Sees him every 2 weeks. He has great-grandchildren.  Marital Status: been married for 32 years   Social History   Socioeconomic History   Marital status: Married    Spouse name: Phillis   Number of children: 4   Years of education: Not on file   Highest education level: Not on file  Occupational History   Occupation: retired  Tobacco Use   Smoking status: Former    Passive exposure: Past   Smokeless tobacco: Never   Tobacco comments:    quit 1980  Vaping Use   Vaping status: Never Used  Substance and Sexual Activity   Alcohol  use: No    Alcohol /week: 0.0 standard drinks of alcohol    Drug use: No   Sexual activity: Yes    Partners: Female  Other Topics Concern   Not on file  Social History Narrative   Married, retired for children   He is a former smoker no alcohol  or substance use      Lives with wife   Social Drivers of Health   Tobacco Use: Low Risk (12/08/2024)    Received from [facility]   Patient History    Smoking Tobacco Use: Never    Smokeless Tobacco Use: Never    Passive Exposure: Never  Recent Concern: Tobacco Use - Medium Risk (10/28/2024)   Patient History    Smoking Tobacco Use: Former    Smokeless Tobacco Use: Never    Passive Exposure: Past  Physicist, Medical Strain: Low Risk (01/07/2024)   Overall Financial Resource Strain (CARDIA)    Difficulty of Paying Living Expenses: Not very hard  Food Insecurity: No Food Insecurity (11/02/2024)   Received from [facility]   Epic    Within the past 12 months, you worried that your food would run out before you got the money to buy more.: Never true    Within the past 12 months, the food you bought just didn't last and you didn't have money to get more.: Never true  Transportation Needs: No Transportation Needs (11/02/2024)   Received from [facility]   PRAPARE - Transportation    Lack of Transportation (Medical): No    Lack of Transportation (Non-Medical): No  Physical Activity: Insufficiently Active (01/07/2024)   Exercise Vital Sign    Days of Exercise per Week: 2 days    Minutes of Exercise per Session: 10 min  Stress: Stress Concern Present (01/07/2024)   [employer] of Occupational Health - Occupational Stress Questionnaire    Feeling of Stress : To some extent  Social Connections: Moderately Integrated (01/07/2024)   Social Connection and Isolation Panel    Frequency of Communication with Friends and Family: More than three times a week    Frequency of Social Gatherings with Friends and Family: More than three times a week    Attends Religious Services: More than 4 times per year    Active Member of Clubs or Organizations: No    Attends Banker Meetings: Never    Marital Status: Married  Depression (PHQ2-9): Low Risk (08/24/2024)   Depression (PHQ2-9)    PHQ-2 Score: 0  Recent Concern: Depression (  PHQ2-9) - High Risk (07/06/2024)   Depression  (PHQ2-9)    PHQ-2 Score: 16  Alcohol  Screen: Low Risk (01/07/2024)   Alcohol  Screen    Last Alcohol  Screening Score (AUDIT): 0  Housing: Low Risk (04/28/2024)   Received from [facility]   Epic    What is your living situation today?: I have a steady place to live    Think about the place you live. Do you have problems with any of the following? Choose all that apply:: None/None on this list  Utilities: Low Risk (11/02/2024)   Received from [facility]   Utilities    Within the past 12 months, have you been unable to get utilities(heat, electricity) when it was really needed?: No  Health Literacy: Adequate Health Literacy (01/07/2024)   B1300 Health Literacy    Frequency of need for help with medical instructions: Never    Additional Social History: updated  Allergies:   Allergies  Allergen Reactions   Ambien  [Zolpidem ]     Unknown reaction   Bentyl  [Dicyclomine ]     Memory loss, anxiety, blurred vision   Carafate  [Sucralfate ] Nausea And Vomiting   Lansoprazole  Diarrhea   Lexapro  [Escitalopram  Oxalate]     Unknown reaction    Other     Pt states he is sensitive to all oral medications   Pneumovax [Pneumococcal Polysaccharide Vaccine] Other (See Comments)    pain   Prilosec [Omeprazole ] Other (See Comments)    Per patient joint pain with PPI's   Remeron  [Mirtazapine ]     Unknown reaction    Seroquel  [Quetiapine ] Other (See Comments)    Unknown reaction    Statins Other (See Comments)    Joint pain   Tizanidine  Other (See Comments)    Made patient feel crazy   Tramadol  Other (See Comments)    Pt can take small amounts up to twice daily but continued use causes chest / abdominal pain   Tylenol  [Acetaminophen ] Nausea And Vomiting    Can only take for 2 takes then he starts to have pain   Pantoprazole  Rash    Current Medications: Current Outpatient Medications  Medication Sig Dispense Refill   gabapentin  (NEURONTIN ) 100 MG capsule Take 1 capsule (100 mg total)  by mouth daily as needed (increased anxiety). 60 capsule 0   acetaminophen  (TYLENOL ) 500 MG tablet Take 500 mg by mouth every 6 (six) hours as needed.     bismuth subsalicylate (PEPTO BISMOL) 262 MG/15ML suspension Take 30 mLs by mouth every 6 (six) hours as needed. (Patient not taking: Reported on 10/28/2024)     carvedilol  (COREG ) 3.125 MG tablet Take 1 tablet (3.125 mg total) by mouth 2 (two) times daily. (Patient not taking: Reported on 10/28/2024) 180 tablet 3   hydrocortisone  (ANUSOL -HC) 2.5 % rectal cream Place 1 Application rectally 2 (two) times daily. 30 g 1   hydrOXYzine  (ATARAX ) 25 MG tablet 1-2 tabs by mouth as needed for itching or sleep (Patient not taking: Reported on 10/28/2024) 40 tablet 2   ketoconazole  (NIZORAL ) 2 % cream Apply 1 Application topically daily. To corners of the mouth 60 g 1   Menthol , Topical Analgesic, (BIOFREEZE EX) Apply topically.     methylPREDNISolone  (MEDROL  DOSEPAK) 4 MG TBPK tablet 4 tabs by mouth per day x 3 days, 2 tabs x 3 days, 1 tab x 3 days (Patient not taking: Reported on 10/22/2024) 21 tablet 0   nitroGLYCERIN  (NITROSTAT ) 0.4 MG SL tablet Place 1 tablet (0.4 mg total) under the  tongue every 5 (five) minutes as needed for chest pain. 90 tablet 3   pantoprazole  (PROTONIX ) 40 MG tablet Take 1 tablet (40 mg total) by mouth daily as needed. 90 tablet 3   traMADol  (ULTRAM ) 50 MG tablet Take 1 tablet (50 mg total) by mouth every 6 (six) hours as needed. 120 tablet 1   venlafaxine  XR (EFFEXOR -XR) 37.5 MG 24 hr capsule Take 1 capsule (37.5 mg total) by mouth daily with breakfast. 60 capsule 0   No current facility-administered medications for this visit.    ROS: Review of Systems Respiratory:  Negative for shortness of breath.   Cardiovascular:  Negative for chest pain.  Gastrointestinal:  Positive for nausea, negative for constipation, diarrhea, vomiting.  Neurological:  Negative for headaches.  MSK: positive for bladder pain.    Objective:  Psychiatric Specialty Exam: There were no vitals taken for this visit.There is no height or weight on file to calculate BMI.  General Appearance: Casual  Eye Contact:  Fair  Speech:  Clear and Coherent  Volume:  Normal  Mood:  better but had a rough night yesterday  Affect:  Blunt  Thought Content: Rumination   Suicidal Thoughts:  No  Homicidal Thoughts:  No  Thought Process:  Coherent  Orientation:  Full (Time, Place, and Person)    Memory:  Grossly intact   Judgment:  Intact  Insight:  Shallow  Concentration:  Concentration: Fair  Recall:  not formally assessed   Fund of Knowledge: Fair  Language: Fair  Psychomotor Activity:  Normal  Akathisia:  No  AIMS (if indicated): not done  Assets:  Communication Skills Desire for Improvement Financial Resources/Insurance Housing Intimacy Leisure Time Resilience Social Support Transportation  ADL's:  Intact  Cognition: WNL  Sleep:  Poor   PE: General: sits comfortably in view of camera; no acute distress  Pulm: no increased work of breathing on room air  MSK: all extremity movements appear intact  Neuro: no focal neurological deficits observed  Gait & Station: unable to assess by video    Metabolic Disorder Labs: Lab Results  Component Value Date   HGBA1C 6.0 10/09/2023   MPG 126 01/10/2023   MPG 122.63 09/10/2021   No results found for: PROLACTIN Lab Results  Component Value Date   CHOL 222 (H) 10/09/2023   TRIG 287.0 (H) 10/09/2023   HDL 38.10 (L) 10/09/2023   CHOLHDL 6 10/09/2023   VLDL 57.4 (H) 10/09/2023   LDLCALC 126 (H) 10/09/2023   LDLCALC 94 11/28/2022   Lab Results  Component Value Date   TSH 3.30 10/09/2023    Therapeutic Level Labs: No results found for: LITHIUM No results found for: CBMZ No results found for: VALPROATE  Screenings:  GAD-7    Flowsheet Row Office Visit from 04/02/2017 in [facility]  Total GAD-7 Score 2   PHQ2-9     Flowsheet Row Office Visit from 08/24/2024 in [facility] [place] Office Visit from 07/06/2024 in [facility] Office Visit from 03/05/2024 in [facility] [place] Office Visit from 01/23/2024 in [facility] [place] [facility] Clinical Support from 01/07/2024 in [facility]  PHQ-2 Total Score 0 4 0 3 3  PHQ-9 Total Score 0 16 0 8 8   Flowsheet Row Office Visit from 07/06/2024 in [facility] [place] ED from 04/16/2022 in [facility] at [facility]  [facility] Admission (Discharged) from 09/08/2021 in [place] [facility]  C-SSRS RISK CATEGORY Error: Q3, 4, or 5 should not be populated when Q2 is No No Risk No Risk    Collaboration of Care: Collaboration of Care: Psychiatrist AEB attending MD  Patient/Guardian was advised Release of Information must be obtained prior to any record release in order to collaborate their care with an outside provider. Patient/Guardian was advised if they have not already done so to contact the registration department to sign all necessary forms in order for us  to release information regarding their care.   Consent: Patient/Guardian gives verbal consent for treatment and assignment of benefits for services provided during this visit. Patient/Guardian expressed understanding and agreed to proceed.   Winnie Barsky, MD, PGY-3 2/4/202611:31 AM  "

## 2024-12-16 ENCOUNTER — Telehealth (HOSPITAL_COMMUNITY): Admitting: Psychiatry

## 2024-12-16 DIAGNOSIS — F411 Generalized anxiety disorder: Secondary | ICD-10-CM | POA: Diagnosis not present

## 2024-12-16 DIAGNOSIS — F3341 Major depressive disorder, recurrent, in partial remission: Secondary | ICD-10-CM | POA: Diagnosis not present

## 2024-12-16 DIAGNOSIS — F450 Somatization disorder: Secondary | ICD-10-CM

## 2024-12-16 MED ORDER — VENLAFAXINE HCL ER 37.5 MG PO CP24: 37.5000 mg | ORAL_CAPSULE | Freq: Every day | ORAL | 0 refills | Status: AC

## 2024-12-16 MED ORDER — GABAPENTIN 100 MG PO CAPS: 100.0000 mg | ORAL_CAPSULE | Freq: Every day | ORAL | 0 refills | Status: AC | PRN

## 2024-12-16 NOTE — Addendum Note (Signed)
 Addended by: CARVIN CROCK on: 12/16/2024 11:47 AM   Modules accepted: Level of Service

## 2024-12-30 ENCOUNTER — Telehealth (HOSPITAL_COMMUNITY): Admitting: Psychiatry

## 2025-01-06 ENCOUNTER — Ambulatory Visit

## 2025-01-07 ENCOUNTER — Ambulatory Visit: Payer: Medicare Other

## 2025-01-11 ENCOUNTER — Other Ambulatory Visit: Payer: Medicare Other

## 2025-01-11 ENCOUNTER — Ambulatory Visit: Payer: Medicare Other | Admitting: Hematology and Oncology

## 2025-01-13 ENCOUNTER — Inpatient Hospital Stay: Admitting: Hematology and Oncology

## 2025-01-13 ENCOUNTER — Inpatient Hospital Stay

## 2025-01-21 ENCOUNTER — Ambulatory Visit: Admitting: "Endocrinology
